# Patient Record
Sex: Female | Born: 1945 | Race: White | Hispanic: No | Smoking: Never smoker
Health system: Southern US, Community
[De-identification: ages and names within clinical notes are randomized; demographics above are authoritative.]

## PROBLEM LIST (undated history)

## (undated) DIAGNOSIS — M1990 Unspecified osteoarthritis, unspecified site: Secondary | ICD-10-CM

## (undated) DIAGNOSIS — Z8659 Personal history of other mental and behavioral disorders: Secondary | ICD-10-CM

## (undated) DIAGNOSIS — I2510 Atherosclerotic heart disease of native coronary artery without angina pectoris: Secondary | ICD-10-CM

## (undated) DIAGNOSIS — L900 Lichen sclerosus et atrophicus: Secondary | ICD-10-CM

## (undated) DIAGNOSIS — K219 Gastro-esophageal reflux disease without esophagitis: Secondary | ICD-10-CM

## (undated) DIAGNOSIS — F419 Anxiety disorder, unspecified: Secondary | ICD-10-CM

## (undated) DIAGNOSIS — E039 Hypothyroidism, unspecified: Secondary | ICD-10-CM

## (undated) DIAGNOSIS — L409 Psoriasis, unspecified: Secondary | ICD-10-CM

## (undated) DIAGNOSIS — I10 Essential (primary) hypertension: Secondary | ICD-10-CM

## (undated) DIAGNOSIS — F40240 Claustrophobia: Secondary | ICD-10-CM

## (undated) DIAGNOSIS — I639 Cerebral infarction, unspecified: Secondary | ICD-10-CM

## (undated) DIAGNOSIS — T7840XA Allergy, unspecified, initial encounter: Secondary | ICD-10-CM

## (undated) DIAGNOSIS — E785 Hyperlipidemia, unspecified: Secondary | ICD-10-CM

## (undated) DIAGNOSIS — N200 Calculus of kidney: Secondary | ICD-10-CM

## (undated) DIAGNOSIS — N390 Urinary tract infection, site not specified: Secondary | ICD-10-CM

## (undated) DIAGNOSIS — Z8619 Personal history of other infectious and parasitic diseases: Secondary | ICD-10-CM

## (undated) HISTORY — PX: CARPAL TUNNEL RELEASE: SHX101

## (undated) HISTORY — DX: Hypothyroidism, unspecified: E03.9

## (undated) HISTORY — DX: Hyperlipidemia, unspecified: E78.5

## (undated) HISTORY — PX: APPENDECTOMY: SHX54

## (undated) HISTORY — DX: Lichen sclerosus et atrophicus: L90.0

## (undated) HISTORY — PX: TUBAL LIGATION: SHX77

## (undated) HISTORY — PX: JOINT REPLACEMENT: SHX530

## (undated) HISTORY — DX: Atherosclerotic heart disease of native coronary artery without angina pectoris: I25.10

## (undated) HISTORY — PX: CARDIAC CATHETERIZATION: SHX172

## (undated) HISTORY — PX: OTHER SURGICAL HISTORY: SHX169

## (undated) HISTORY — DX: Personal history of other infectious and parasitic diseases: Z86.19

## (undated) HISTORY — DX: Cerebral infarction, unspecified: I63.9

## (undated) HISTORY — DX: Allergy, unspecified, initial encounter: T78.40XA

---

## 2002-12-27 HISTORY — PX: EYE SURGERY: SHX253

## 2005-11-15 DIAGNOSIS — E039 Hypothyroidism, unspecified: Secondary | ICD-10-CM | POA: Insufficient documentation

## 2006-01-25 DIAGNOSIS — E1169 Type 2 diabetes mellitus with other specified complication: Secondary | ICD-10-CM | POA: Insufficient documentation

## 2006-07-01 ENCOUNTER — Ambulatory Visit: Payer: Self-pay | Admitting: Family Medicine

## 2009-07-04 ENCOUNTER — Ambulatory Visit: Payer: Self-pay | Admitting: Family Medicine

## 2010-06-10 ENCOUNTER — Observation Stay (HOSPITAL_COMMUNITY): Admission: EM | Admit: 2010-06-10 | Discharge: 2010-06-11 | Payer: Self-pay | Admitting: Emergency Medicine

## 2010-06-10 ENCOUNTER — Emergency Department (HOSPITAL_COMMUNITY): Admission: EM | Admit: 2010-06-10 | Discharge: 2010-06-10 | Payer: Self-pay | Admitting: Family Medicine

## 2010-06-11 ENCOUNTER — Encounter (INDEPENDENT_AMBULATORY_CARE_PROVIDER_SITE_OTHER): Payer: Self-pay | Admitting: Surgery

## 2010-10-24 ENCOUNTER — Ambulatory Visit: Payer: Self-pay | Admitting: Cardiology

## 2010-10-24 ENCOUNTER — Inpatient Hospital Stay (HOSPITAL_COMMUNITY)
Admission: EM | Admit: 2010-10-24 | Discharge: 2010-10-27 | Payer: Self-pay | Source: Home / Self Care | Admitting: Emergency Medicine

## 2010-10-25 ENCOUNTER — Encounter (INDEPENDENT_AMBULATORY_CARE_PROVIDER_SITE_OTHER): Payer: Self-pay | Admitting: Internal Medicine

## 2010-11-06 ENCOUNTER — Ambulatory Visit: Payer: Self-pay | Admitting: Internal Medicine

## 2010-11-06 DIAGNOSIS — I152 Hypertension secondary to endocrine disorders: Secondary | ICD-10-CM | POA: Insufficient documentation

## 2010-11-06 DIAGNOSIS — E785 Hyperlipidemia, unspecified: Secondary | ICD-10-CM

## 2010-11-06 DIAGNOSIS — I2510 Atherosclerotic heart disease of native coronary artery without angina pectoris: Secondary | ICD-10-CM

## 2010-11-06 DIAGNOSIS — E1159 Type 2 diabetes mellitus with other circulatory complications: Secondary | ICD-10-CM | POA: Insufficient documentation

## 2010-11-06 DIAGNOSIS — I10 Essential (primary) hypertension: Secondary | ICD-10-CM | POA: Insufficient documentation

## 2011-01-26 NOTE — Assessment & Plan Note (Signed)
Summary: F/U [facility]   Visit Type:  Initial Consult Primary Provider:  Dr Lorie Phenix  CC:  f/u cath.  History of Present Illness: Renee [name] is a very pleasant 65 year old woman with a history of hypertension, hyperlipidemia, and a long history of diabetes.  She has admitted to [facility] earlier this month with chest pain and signifcant HTN  Cardiac cath showed mild to moderate non-obstructive CAD. With  EF of 55% to 60%.   Lipids on admit:  TC 134 TG 81 HDL 41 LDL 77 (on Lopid, Fish oil and Niacin 500qd)  Exercising at gym on treadmill and bike. Since discharge doing well no CP. No problems with groin site. Following BPs closely. Systolics mainly 120s occasionally up to 140 on lisinopril 10. On discharge lopid and niacin stopped, now on lipitor 40 and fish oil two times a day.    Preventive Screening-Counseling & Management  Alcohol-Tobacco     Smoking Status: never  Caffeine-Diet-Exercise     Does Patient Exercise: yes      Drug Use:  no.    Current Medications (verified): 1)  Lipitor 80 Mg Tabs (Atorvastatin Calcium) .... 1/2 Tablet Once Daily For One Week 2)  Xanax 0.5 Mg Tabs (Alprazolam) .... One Tablet Two Times A Day As Needed 3)  Metoprolol Tartrate 25 Mg Tabs (Metoprolol Tartrate) .... One Tablet Two Times A Day Hold For Heart Rate Less Thank 70 4)  Nitrostat 0.4 Mg Subl (Nitroglycerin) .... As Needed For Chest Pain 5)  Aspirin 81 Mg Tbec (Aspirin) .... One Tablet Once Daily 6)  Cerave  Crea (Emollient) .... One Application Topically Prn 7)  Fish Oil 1000 Mg Caps (Omega-3 Fatty Acids) .... One Tablet Two Times A Day 8)  Fluocinonide 0.05 % Crea (Fluocinonide) .... One Application Topically Daily At Bedtime As Needed 9)  Levothroid 75 Mcg Tabs (Levothyroxine Sodium) .... One Tablet Once Daily 10)  Lisinopril 10 Mg Tabs (Lisinopril) .... One Tablet Once Daily 11)  Centrum Silver  Tabs (Multiple Vitamins-Minerals) .... One Tablet Once Daily 12)   Triamcinolone Acetonide 0.1 % Crea (Triamcinolone Acetonide) 13)  Pepcid 20 Mg Tabs (Famotidine) .... At Bedtime  Allergies (verified): 1)  ! Codeine 2)  ! Zocor 3)  ! Metformin Hcl  Past History:  Past Medical History: Diabetes Hyperlipidemia Hypothyroidism Non obstructive CAD   --cath 11/11  Past Surgical History: Appendectomy Tubes tied and untied and tied again L Carpal tunnel L trigger finger  Family History: Reviewed history and no changes required. Family History of Cancer:  Family History of Coronary Artery Disease:  Family History of CVA or Stroke:  Family History of Diabetes:  Family History of Hyperlipidemia:  Family History of Hypertension:  Family History of Thyroid Disease:   Social History: Reviewed history and no changes required. Retired  Divorced  Tobacco Use - No.  Alcohol Use - yes -- very little around holidays Regular Exercise - yes Drug Use - no Smoking Status:  never Does Patient Exercise:  yes Drug Use:  no  Review of Systems       As per HPI and past medical history; otherwise all systems negative.   Vital Signs:  Patient profile:   65 year old female Height:      64 inches Weight:      162 pounds BMI:     27.91 Pulse rate:   70 / minute BP sitting:   142 / 74  (left arm) Cuff size:   large  Vitals Entered By: Hardin Negus, RMA (November 06, 2010 10:52 AM)  Physical Exam  General:  Gen: well appearing. no resp difficulty HEENT: normal Neck: supple. no JVD. Carotids 2+ bilat; no bruits. No lymphadenopathy or thryomegaly appreciated. Cor: PMI nondisplaced. Regular rate & rhythm. No rubs, gallops, murmur. Lungs: clear Abdomen: soft, nontender, nondistended. No hepatosplenomegaly. No bruits or masses. Good bowel sounds. Extremities: no cyanosis, clubbing, rash, edema. tinky knot at groin site. no bruit. Neuro: alert & orientedx3, cranial nerves grossly intact. moves all 4 extremities w/o difficulty. affect  pleasant    Impression & Recommendations:  Problem # 1:  CAD, NATIVE VESSEL (ICD-414.01) Non-obstructive. Continue risk factor management.   Problem # 2:  HYPERTENSION (ICD-401.9) BP looks good at home (mildly elevated here.) If systolics tredning over 140 can increase lisionpril to 20.   Problem # 3:  HYPERLIPIDEMIA-MIXED (ICD-272.4) LDL just about at goal without statin.  Continue lipitor 40 and fish Can increase lipitor to 80 once daily if needed to keep LDL under 70.   Other Orders: EKG w/ Interpretation (93000)  Patient Instructions: 1)  Your physician recommends that you schedule a follow-up appointment in: 1 year with Dr Mariah Milling 2)  take extra lisinopril when BP is up

## 2011-03-09 LAB — CBC
MCHC: 32.5 g/dL (ref 30.0–36.0)
RDW: 13.1 % (ref 11.5–15.5)
WBC: 5.9 10*3/uL (ref 4.0–10.5)

## 2011-03-09 LAB — BASIC METABOLIC PANEL
BUN: 15 mg/dL (ref 6–23)
Calcium: 9.3 mg/dL (ref 8.4–10.5)
GFR calc non Af Amer: >60 mL/min (ref >60)
Glucose, Bld: 140 mg/dL — ABNORMAL HIGH (ref 70–99)
Sodium: 142 mEq/L (ref 135–145)

## 2011-03-09 LAB — GLUCOSE, CAPILLARY
Glucose-Capillary: 105 mg/dL — ABNORMAL HIGH (ref 70–99)
Glucose-Capillary: 142 mg/dL — ABNORMAL HIGH (ref 70–99)

## 2011-03-10 LAB — GLUCOSE, CAPILLARY
Glucose-Capillary: 103 mg/dL — ABNORMAL HIGH (ref 70–99)
Glucose-Capillary: 106 mg/dL — ABNORMAL HIGH (ref 70–99)
Glucose-Capillary: 109 mg/dL — ABNORMAL HIGH (ref 70–99)
Glucose-Capillary: 109 mg/dL — ABNORMAL HIGH (ref 70–99)
Glucose-Capillary: 144 mg/dL — ABNORMAL HIGH (ref 70–99)

## 2011-03-10 LAB — URINALYSIS, ROUTINE W REFLEX MICROSCOPIC
Bilirubin Urine: NEGATIVE
Ketones, ur: NEGATIVE mg/dL
Nitrite: NEGATIVE
Specific Gravity, Urine: 1.005 (ref 1.005–1.030)
Urobilinogen, UA: 0.2 mg/dL (ref 0.0–1.0)

## 2011-03-10 LAB — LIPID PANEL
Cholesterol: 134 mg/dL (ref 0–200)
HDL: 41 mg/dL (ref >39)
Total CHOL/HDL Ratio: 3.3 RATIO
VLDL: 16 mg/dL (ref 0–40)

## 2011-03-10 LAB — CBC
HCT: 39.5 % (ref 36.0–46.0)
Hemoglobin: 12.8 g/dL (ref 12.0–15.0)
MCH: 30.4 pg (ref 26.0–34.0)
MCHC: 32.7 g/dL (ref 30.0–36.0)
MCV: 92.9 fL (ref 78.0–100.0)
MCV: 93.4 fL (ref 78.0–100.0)
Platelets: 343 10*3/uL (ref 150–400)
RBC: 4.23 MIL/uL (ref 3.87–5.11)
RDW: 13.2 % (ref 11.5–15.5)
WBC: 5.4 10*3/uL (ref 4.0–10.5)

## 2011-03-10 LAB — DIFFERENTIAL
Eosinophils Absolute: 0.2 10*3/uL (ref 0.0–0.7)
Eosinophils Relative: 3 % (ref 0–5)
Lymphs Abs: 1.9 10*3/uL (ref 0.7–4.0)
Lymphs Abs: 2.5 10*3/uL (ref 0.7–4.0)
Monocytes Absolute: 0.7 10*3/uL (ref 0.1–1.0)
Monocytes Absolute: 1 10*3/uL (ref 0.1–1.0)
Monocytes Relative: 12 % (ref 3–12)
Monocytes Relative: 14 % — ABNORMAL HIGH (ref 3–12)
Neutro Abs: 2.6 10*3/uL (ref 1.7–7.7)
Neutrophils Relative %: 49 % (ref 43–77)
Neutrophils Relative %: 50 % (ref 43–77)

## 2011-03-10 LAB — POCT CARDIAC MARKERS
CKMB, poc: <1 ng/mL — ABNORMAL LOW (ref 1.0–8.0)
Troponin i, poc: <0.05 ng/mL (ref 0.00–0.09)
Troponin i, poc: <0.05 ng/mL (ref 0.00–0.09)

## 2011-03-10 LAB — COMPREHENSIVE METABOLIC PANEL
Alkaline Phosphatase: 60 U/L (ref 39–117)
BUN: 17 mg/dL (ref 6–23)
Chloride: 107 mEq/L (ref 96–112)
Glucose, Bld: 147 mg/dL — ABNORMAL HIGH (ref 70–99)
Potassium: 3.7 mEq/L (ref 3.5–5.1)
Total Bilirubin: 0.4 mg/dL (ref 0.3–1.2)

## 2011-03-10 LAB — TROPONIN I: Troponin I: <0.01 ng/mL (ref 0.00–0.06)

## 2011-03-10 LAB — PROTIME-INR
INR: 1 (ref 0.00–1.49)
Prothrombin Time: 13.4 seconds (ref 11.6–15.2)

## 2011-03-10 LAB — URINE MICROSCOPIC-ADD ON

## 2011-03-10 LAB — HEMOGLOBIN A1C: Mean Plasma Glucose: 131 mg/dL — ABNORMAL HIGH (ref <117)

## 2011-03-10 LAB — CARDIAC PANEL(CRET KIN+CKTOT+MB+TROPI)
Relative Index: INVALID (ref 0.0–2.5)
Total CK: 64 U/L (ref 7–177)

## 2011-03-10 LAB — BASIC METABOLIC PANEL
BUN: 18 mg/dL (ref 6–23)
BUN: 21 mg/dL (ref 6–23)
CO2: 24 mEq/L (ref 19–32)
CO2: 25 mEq/L (ref 19–32)
Chloride: 107 mEq/L (ref 96–112)
Chloride: 108 mEq/L (ref 96–112)
Creatinine, Ser: 0.68 mg/dL (ref 0.4–1.2)
Glucose, Bld: 111 mg/dL — ABNORMAL HIGH (ref 70–99)
Glucose, Bld: 127 mg/dL — ABNORMAL HIGH (ref 70–99)
Potassium: 3.8 mEq/L (ref 3.5–5.1)

## 2011-03-10 LAB — MAGNESIUM: Magnesium: 1.9 mg/dL (ref 1.5–2.5)

## 2011-03-14 LAB — COMPREHENSIVE METABOLIC PANEL
BUN: 12 mg/dL (ref 6–23)
CO2: 27 mEq/L (ref 19–32)
Chloride: 104 mEq/L (ref 96–112)
Creatinine, Ser: 0.59 mg/dL (ref 0.4–1.2)
GFR calc non Af Amer: >60 mL/min (ref >60)
Total Bilirubin: 0.4 mg/dL (ref 0.3–1.2)

## 2011-03-14 LAB — URINALYSIS, MICROSCOPIC ONLY
Hgb urine dipstick: NEGATIVE
Nitrite: NEGATIVE
Specific Gravity, Urine: 1.007 (ref 1.005–1.030)
Urobilinogen, UA: 0.2 mg/dL (ref 0.0–1.0)

## 2011-03-14 LAB — CBC
HCT: 37.8 % (ref 36.0–46.0)
Hemoglobin: 13 g/dL (ref 12.0–15.0)
MCV: 92.1 fL (ref 78.0–100.0)
RBC: 4.1 MIL/uL (ref 3.87–5.11)
WBC: 8.3 10*3/uL (ref 4.0–10.5)

## 2011-03-14 LAB — DIFFERENTIAL
Basophils Absolute: 0 10*3/uL (ref 0.0–0.1)
Eosinophils Relative: 2 % (ref 0–5)
Lymphocytes Relative: 24 % (ref 12–46)
Neutro Abs: 5.2 10*3/uL (ref 1.7–7.7)

## 2011-03-14 LAB — GLUCOSE, CAPILLARY
Glucose-Capillary: 145 mg/dL — ABNORMAL HIGH (ref 70–99)
Glucose-Capillary: 86 mg/dL (ref 70–99)

## 2011-03-14 LAB — POCT URINALYSIS DIP (DEVICE)
Bilirubin Urine: NEGATIVE
Glucose, UA: NEGATIVE mg/dL
Nitrite: NEGATIVE

## 2011-03-14 LAB — LIPASE, BLOOD: Lipase: 27 U/L (ref 11–59)

## 2011-03-14 LAB — URINE CULTURE

## 2011-03-19 ENCOUNTER — Ambulatory Visit: Payer: 59 | Admitting: Unknown Physician Specialty

## 2011-05-17 ENCOUNTER — Telehealth: Payer: Self-pay | Admitting: Internal Medicine

## 2011-05-17 NOTE — Telephone Encounter (Signed)
Ok to cut lipitor in 1/2

## 2011-05-17 NOTE — Telephone Encounter (Signed)
Pt called stating she had lipid panel 1 week prior at Dr. Santiago Bur office, showing very low results. Pt is taking Lipitor 40mg  daily. Pt saw Dr. Gala Romney 10/2010 post cath, and pt was to follow up in 1 year with Dr. Mariah Milling. Lipids on admit 10/2010: TC 134 TG 81 HDL 41 LDL 77 (on Lopid, Fish oil and Niacin 500qd). Since pt close to goal at discharge pt was decr from Lipitor 80 to 40 daily. Pt states that labs at Dr. Elease Hashimoto last week were: TC 97 TG 83 HDL 42 LDL 38. I have requested labs from Dr. Elease Hashimoto. I advised pt for now to cut her dosage in half to 20mg , and in the meantime I will discuss with Dr. Mariah Milling and will call her back with recommendation. Please advise.

## 2011-05-17 NOTE — Telephone Encounter (Signed)
Pt would like to discuss lowering Cholesterol medication.

## 2011-05-18 NOTE — Telephone Encounter (Signed)
Pt notified. Dr. Elease Hashimoto will repeat labs in July 2012.

## 2011-07-13 ENCOUNTER — Encounter: Payer: Self-pay | Admitting: Internal Medicine

## 2011-11-11 ENCOUNTER — Encounter: Payer: Self-pay | Admitting: Cardiovascular Disease

## 2011-11-17 ENCOUNTER — Ambulatory Visit (INDEPENDENT_AMBULATORY_CARE_PROVIDER_SITE_OTHER): Payer: Medicare Other | Admitting: Cardiovascular Disease

## 2011-11-17 ENCOUNTER — Ambulatory Visit: Payer: Self-pay | Admitting: Cardiovascular Disease

## 2011-11-17 ENCOUNTER — Encounter: Payer: Self-pay | Admitting: Cardiovascular Disease

## 2011-11-17 VITALS — BP 146/84 | HR 77 | Ht 64.0 in | Wt 146.2 lb

## 2011-11-17 DIAGNOSIS — I2510 Atherosclerotic heart disease of native coronary artery without angina pectoris: Secondary | ICD-10-CM

## 2011-11-17 DIAGNOSIS — E785 Hyperlipidemia, unspecified: Secondary | ICD-10-CM

## 2011-11-17 DIAGNOSIS — I10 Essential (primary) hypertension: Secondary | ICD-10-CM

## 2011-11-17 NOTE — Assessment & Plan Note (Signed)
Currently with no symptoms of angina. No further workup at this time. Continue current medication regimen. 

## 2011-11-17 NOTE — Progress Notes (Signed)
Patient ID: Renee [name], female    DOB: 10-Feb-1946, 65 y.o.   MRN: [id number]  HPI Comments: Renee [name] is a very pleasant 65 year old woman with a history of hypertension, hyperlipidemia, and a long history of diabetes.  She has admitted to [facility] chest pain and signifcant HTN. 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 With  EF of 55% to 60%.    Since that time, she has lost 16 pounds and her hemoglobin A1c is in the low 6 range. She has been working out at [employer] with good exercise tolerance. She denies any significant chest pain or shortness of breath. She is tolerating Lipitor 20 mg daily and otherwise feels well.   EKG shows normal sinus rhythm with rate 77 beats per minute with no significant ST or T wave changes      Outpatient Encounter Prescriptions as of 11/17/2011  Medication Sig Dispense Refill  . aspirin 81 MG EC tablet Take 81 mg by mouth daily.        Marland Kitchen atorvastatin (LIPITOR) 20 MG tablet Take 20 mg by mouth daily.        . Emollient (CERAVE) CREA Apply topically as needed.        . fish oil-omega-3 fatty acids 1000 MG capsule Take 2 g by mouth 2 (two) times daily.        . fluocinonide (LIDEX) 0.05 % cream Apply topically at bedtime as needed.        Marland Kitchen levothyroxine (SYNTHROID, LEVOTHROID) 75 MCG tablet Take 75 mcg by mouth daily.        Marland Kitchen lisinopril (PRINIVIL,ZESTRIL) 20 MG tablet Take 20 mg by mouth daily.        . Multiple Vitamins-Minerals (CENTRUM SILVER PO) Take 1 tablet by mouth daily.        . nitroGLYCERIN (NITROSTAT) 0.4 MG SL tablet Place 0.4 mg under the tongue every 5 (five) minutes as needed.        . triamcinolone (KENALOG) 0.1 % cream Apply topically as directed.           Review of Systems  Constitutional: Negative.   HENT: Negative.   Eyes: Negative.   Respiratory: Negative.   Cardiovascular: Negative.   Gastrointestinal: Negative.   Musculoskeletal: Negative.   Skin: Negative.   Neurological: Negative.   Hematological:  Negative.   Psychiatric/Behavioral: Negative.   All other systems reviewed and are negative.    BP 146/84  Pulse 77  Ht 5\' 4"  (1.626 m)  Wt 146 lb 4 oz (66.339 kg)  BMI 25.10 kg/m2  Physical Exam  Nursing note and vitals reviewed. Constitutional: She is oriented to person, place, and time. She appears well-developed and well-nourished.  HENT:  Head: Normocephalic.  Nose: Nose normal.  Mouth/Throat: Oropharynx is clear and moist.  Eyes: Conjunctivae are normal. Pupils are equal, round, and reactive to light.  Neck: Normal range of motion. Neck supple. No JVD present.  Cardiovascular: Normal rate, regular rhythm, S1 normal, S2 normal, normal heart sounds and intact distal pulses.  Exam reveals no gallop and no friction rub.   No murmur heard. Pulmonary/Chest: Effort normal and breath sounds normal. No respiratory distress. She has no wheezes. She has no rales. She exhibits no tenderness.  Abdominal: Soft. Bowel sounds are normal. She exhibits no distension. There is no tenderness.  Musculoskeletal: Normal range of motion. She exhibits no edema and no tenderness.  Lymphadenopathy:    She has no cervical adenopathy.  Neurological: She is alert and  oriented to person, place, and time. Coordination normal.  Skin: Skin is warm and dry. No rash noted. No erythema.  Psychiatric: She has a normal mood and affect. Her behavior is normal. Judgment and thought content normal.         Assessment and Plan

## 2011-11-17 NOTE — Assessment & Plan Note (Signed)
Cholesterol is at goal on the current lipid regimen. No changes to the medications were made.  

## 2011-11-17 NOTE — Patient Instructions (Signed)
You are doing well. No medication changes were made.  Please call us if you have new issues that need to be addressed before your next appt.  The office will contact you for a follow up Appt. In 12 months  

## 2011-11-17 NOTE — Assessment & Plan Note (Signed)
Blood pressure is well controlled on today's visit. No changes made to the medications. 

## 2011-12-30 DIAGNOSIS — L293 Anogenital pruritus, unspecified: Secondary | ICD-10-CM | POA: Diagnosis not present

## 2011-12-31 DIAGNOSIS — L280 Lichen simplex chronicus: Secondary | ICD-10-CM | POA: Diagnosis not present

## 2011-12-31 DIAGNOSIS — L293 Anogenital pruritus, unspecified: Secondary | ICD-10-CM | POA: Diagnosis not present

## 2012-01-11 DIAGNOSIS — H47019 Ischemic optic neuropathy, unspecified eye: Secondary | ICD-10-CM | POA: Diagnosis not present

## 2012-01-12 DIAGNOSIS — L851 Acquired keratosis [keratoderma] palmaris et plantaris: Secondary | ICD-10-CM | POA: Diagnosis not present

## 2012-03-07 DIAGNOSIS — L851 Acquired keratosis [keratoderma] palmaris et plantaris: Secondary | ICD-10-CM | POA: Diagnosis not present

## 2012-03-15 DIAGNOSIS — I10 Essential (primary) hypertension: Secondary | ICD-10-CM | POA: Diagnosis not present

## 2012-03-15 DIAGNOSIS — E119 Type 2 diabetes mellitus without complications: Secondary | ICD-10-CM | POA: Diagnosis not present

## 2012-03-15 DIAGNOSIS — E039 Hypothyroidism, unspecified: Secondary | ICD-10-CM | POA: Diagnosis not present

## 2012-03-15 DIAGNOSIS — E7800 Pure hypercholesterolemia, unspecified: Secondary | ICD-10-CM | POA: Diagnosis not present

## 2012-04-05 DIAGNOSIS — T148 Other injury of unspecified body region: Secondary | ICD-10-CM | POA: Diagnosis not present

## 2012-04-05 DIAGNOSIS — R7309 Other abnormal glucose: Secondary | ICD-10-CM | POA: Diagnosis not present

## 2012-04-05 DIAGNOSIS — T7840XA Allergy, unspecified, initial encounter: Secondary | ICD-10-CM | POA: Diagnosis not present

## 2012-09-27 DIAGNOSIS — Z23 Encounter for immunization: Secondary | ICD-10-CM | POA: Diagnosis not present

## 2012-09-27 DIAGNOSIS — L259 Unspecified contact dermatitis, unspecified cause: Secondary | ICD-10-CM | POA: Diagnosis not present

## 2012-09-27 DIAGNOSIS — E119 Type 2 diabetes mellitus without complications: Secondary | ICD-10-CM | POA: Diagnosis not present

## 2012-09-27 DIAGNOSIS — E7800 Pure hypercholesterolemia, unspecified: Secondary | ICD-10-CM | POA: Diagnosis not present

## 2012-10-25 DIAGNOSIS — L570 Actinic keratosis: Secondary | ICD-10-CM | POA: Diagnosis not present

## 2012-11-17 ENCOUNTER — Encounter: Payer: Self-pay | Admitting: Cardiovascular Disease

## 2012-11-17 ENCOUNTER — Ambulatory Visit (INDEPENDENT_AMBULATORY_CARE_PROVIDER_SITE_OTHER): Payer: Medicare Other | Admitting: Cardiovascular Disease

## 2012-11-17 VITALS — BP 158/78 | HR 76 | Ht 64.5 in | Wt 158.0 lb

## 2012-11-17 DIAGNOSIS — I2510 Atherosclerotic heart disease of native coronary artery without angina pectoris: Secondary | ICD-10-CM | POA: Diagnosis not present

## 2012-11-17 DIAGNOSIS — I10 Essential (primary) hypertension: Secondary | ICD-10-CM

## 2012-11-17 DIAGNOSIS — E785 Hyperlipidemia, unspecified: Secondary | ICD-10-CM

## 2012-11-17 NOTE — Assessment & Plan Note (Signed)
Blood pressure is elevated even on recheck. Systolic pressure 160 on today's visit when checked by myself on the left. We have strongly recommended she monitor her blood pressure at home. She reports systolic pressures in the 130 range. I suggested she call either our office or Dr. Elease Hashimoto for systolic pressure more than 140 on a regular basis.

## 2012-11-17 NOTE — Progress Notes (Signed)
Patient ID: Renee [name], female    DOB: 1946-04-27, 66 y.o.   MRN: [id number]  HPI Comments: Renee [name] is a very pleasant 66 year old woman with a history of hypertension, hyperlipidemia, and a long history of diabetes.  She has admitted to [facility] with chest pain and signifcant HTN. 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 With  EF of 55% to 60%.    She reports that since her last clinic visit, she has gained 12 pounds. She is eating more. Her blood pressure is up, hemoglobin A1c is now 6.8. She continues on Lipitor. She is active but does not participate in regular exercise program. She does report some mild swelling in her feet and ankles. She denies any significant chest pain or shortness of breath.   EKG shows normal sinus rhythm with rate 75 beats per minute with no significant ST or T wave changes      Outpatient Encounter Prescriptions as of 11/17/2012  Medication Sig Dispense Refill  . aspirin 81 MG EC tablet Take 81 mg by mouth daily.        Marland Kitchen atorvastatin (LIPITOR) 20 MG tablet Take 20 mg by mouth daily.        . Emollient (CERAVE) CREA Apply topically as needed.        . fish oil-omega-3 fatty acids 1000 MG capsule Take 2 g by mouth 2 (two) times daily.        . fluocinonide (LIDEX) 0.05 % cream Apply topically at bedtime as needed.        Marland Kitchen levothyroxine (SYNTHROID, LEVOTHROID) 75 MCG tablet Take 75 mcg by mouth daily.        Marland Kitchen lisinopril (PRINIVIL,ZESTRIL) 20 MG tablet Take 20 mg by mouth daily.        . Multiple Vitamins-Minerals (CENTRUM SILVER PO) Take 1 tablet by mouth daily.        . nitroGLYCERIN (NITROSTAT) 0.4 MG SL tablet Place 0.4 mg under the tongue every 5 (five) minutes as needed.        . triamcinolone (KENALOG) 0.1 % cream Apply topically as directed.           Review of Systems  Constitutional: Positive for unexpected weight change.  HENT: Negative.   Eyes: Negative.   Respiratory: Negative.   Cardiovascular: Negative.   Gastrointestinal:  Negative.   Musculoskeletal: Negative.   Skin: Negative.   Neurological: Negative.   Hematological: Negative.   Psychiatric/Behavioral: Negative.   All other systems reviewed and are negative.    BP 158/78  Pulse 76  Ht 5' 4.5" (1.638 m)  Wt 158 lb (71.668 kg)  BMI 26.70 kg/m2  Physical Exam  Nursing note and vitals reviewed. Constitutional: She is oriented to person, place, and time. She appears well-developed and well-nourished.  HENT:  Head: Normocephalic.  Nose: Nose normal.  Mouth/Throat: Oropharynx is clear and moist.  Eyes: Conjunctivae normal are normal. Pupils are equal, round, and reactive to light.  Neck: Normal range of motion. Neck supple. No JVD present.  Cardiovascular: Normal rate, regular rhythm, S1 normal, S2 normal, normal heart sounds and intact distal pulses.  Exam reveals no gallop and no friction rub.   No murmur heard. Pulmonary/Chest: Effort normal and breath sounds normal. No respiratory distress. She has no wheezes. She has no rales. She exhibits no tenderness.  Abdominal: Soft. Bowel sounds are normal. She exhibits no distension. There is no tenderness.  Musculoskeletal: Normal range of motion. She exhibits no edema and no tenderness.  Lymphadenopathy:    She has no cervical adenopathy.  Neurological: She is alert and oriented to person, place, and time. Coordination normal.  Skin: Skin is warm and dry. No rash noted. No erythema.  Psychiatric: She has a normal mood and affect. Her behavior is normal. Judgment and thought content normal.         Assessment and Plan

## 2012-11-17 NOTE — Patient Instructions (Addendum)
You are doing well. No medication changes were made.  Monitor your blood pressure at home. If your top number continues to be greater than 140, call our office  Please call us if you have new issues that need to be addressed before your next appt.  Your physician wants you to follow-up in: 12 months.  You will receive a reminder letter in the mail two months in advance. If you don't receive a letter, please call our office to schedule the follow-up appointment.

## 2012-11-17 NOTE — Assessment & Plan Note (Signed)
Currently with no symptoms of angina. No further workup at this time. Continue current medication regimen. 

## 2012-11-17 NOTE — Assessment & Plan Note (Addendum)
Given previously seen coronary artery disease, underlying diabetes, we have suggested she stay on her statin. Goal LDL less than 70

## 2012-11-20 DIAGNOSIS — Z1231 Encounter for screening mammogram for malignant neoplasm of breast: Secondary | ICD-10-CM | POA: Diagnosis not present

## 2012-11-28 DIAGNOSIS — L570 Actinic keratosis: Secondary | ICD-10-CM | POA: Diagnosis not present

## 2012-11-28 DIAGNOSIS — L219 Seborrheic dermatitis, unspecified: Secondary | ICD-10-CM | POA: Diagnosis not present

## 2012-11-28 DIAGNOSIS — L738 Other specified follicular disorders: Secondary | ICD-10-CM | POA: Diagnosis not present

## 2012-11-28 DIAGNOSIS — L259 Unspecified contact dermatitis, unspecified cause: Secondary | ICD-10-CM | POA: Diagnosis not present

## 2013-02-01 DIAGNOSIS — E119 Type 2 diabetes mellitus without complications: Secondary | ICD-10-CM | POA: Diagnosis not present

## 2013-04-27 DIAGNOSIS — Z23 Encounter for immunization: Secondary | ICD-10-CM | POA: Diagnosis not present

## 2013-04-27 DIAGNOSIS — I10 Essential (primary) hypertension: Secondary | ICD-10-CM | POA: Diagnosis not present

## 2013-04-27 DIAGNOSIS — E039 Hypothyroidism, unspecified: Secondary | ICD-10-CM | POA: Diagnosis not present

## 2013-04-27 DIAGNOSIS — E119 Type 2 diabetes mellitus without complications: Secondary | ICD-10-CM | POA: Diagnosis not present

## 2013-04-27 DIAGNOSIS — E7800 Pure hypercholesterolemia, unspecified: Secondary | ICD-10-CM | POA: Diagnosis not present

## 2013-05-16 DIAGNOSIS — Z01419 Encounter for gynecological examination (general) (routine) without abnormal findings: Secondary | ICD-10-CM | POA: Diagnosis not present

## 2013-05-16 DIAGNOSIS — Z1151 Encounter for screening for human papillomavirus (HPV): Secondary | ICD-10-CM | POA: Diagnosis not present

## 2013-05-16 DIAGNOSIS — Z124 Encounter for screening for malignant neoplasm of cervix: Secondary | ICD-10-CM | POA: Diagnosis not present

## 2013-05-25 ENCOUNTER — Telehealth: Payer: Self-pay

## 2013-05-25 NOTE — Telephone Encounter (Signed)
Pt has question regarding her medication, Lipitor. Please call

## 2013-05-25 NOTE — Telephone Encounter (Signed)
Pt says she has been on atorvastatin for years, originally started by Dr. Gala Romney.   She began to experience severe muscle aches/pains, insomnia and nausea.  She held atorvastatin on her own x 11 weeks and symptoms resolved She went to PCP (Dr. Elease Hashimoto) this month for physical and informed her she stopped this Cholesterol was checked and total cholesterol was 223 (last cholesterol March 2013=125 on atorvastatin) Dr. Elease Hashimoto asked pt to try simvastatin instead.  Pt tried this x 3 weeks and symptoms returned She has held this medication x 2 days She talked to pharmacist who suggested she call us She asks if there are other alternatives to the atorvastatin I told her I would address with Dr. Mariah Milling and call her back Understanding verb

## 2013-05-29 MED ORDER — ROSUVASTATIN CALCIUM 5 MG PO TABS: 5.0000 mg | ORAL_TABLET | Freq: Every day | ORAL | Status: DC

## 2013-05-29 NOTE — Telephone Encounter (Signed)
Patient would like to try the crestor 5 mg because she is already taking atorvastatin 10 mg 1/2 tablet daily. The patient will come and pick up samples of crestor 10 mg to cut in half to equal 5 mg. She will call to let us know how the crestor 5 mg is working for her. I will send a Rx of crestor 5 mg to [facility] in [place].

## 2013-05-29 NOTE — Telephone Encounter (Signed)
Told the patient to try the Crestor 5 mg take one tablet every other day for a couple of weeks.

## 2013-05-29 NOTE — Telephone Encounter (Signed)
Options include re trying generic Lipitor but cut in half, take 10 mg If she would like an alternate medication, could try Crestor 5 mg daily (perhaps start every other day for a few weeks)

## 2013-05-29 NOTE — Telephone Encounter (Signed)
Sent a Rx for Crestor 5 mg.

## 2013-05-30 ENCOUNTER — Telehealth: Payer: Self-pay

## 2013-05-30 NOTE — Telephone Encounter (Signed)
If unable to afford Crestor, and unable to tolerate generic Lipitor Would start simvastatin 20 mg daily Send in 40 mg pills Would take 20 mg pill for one month before increasing dose

## 2013-05-30 NOTE — Telephone Encounter (Signed)
Pt is requesting a generic RX for cholesterol med She has samples of crestor we gave her but will not be able to afford these after samples run out Has already tried atorvastatin, needs an alternative Will have Dr. Mariah Milling review and advise and will call her back

## 2013-05-30 NOTE — Telephone Encounter (Signed)
Pt picked up samples of Crestor, pt states she needs a generic.

## 2013-05-31 NOTE — Telephone Encounter (Signed)
Left message for call back.

## 2013-05-31 NOTE — Telephone Encounter (Signed)
Patient called back and advised that she already tried to take Simvastatin but did not tolerate due to dizziness and SOB. She tried to get into the patient assistance program for Crestor but was advised that she did not qualify. States that she tolerated Lopid in the past. Advised will discuss with Dr. Mariah Milling and call her back. To stay on Crestor for now.

## 2013-06-05 ENCOUNTER — Telehealth: Payer: Self-pay | Admitting: Cardiovascular Disease

## 2013-06-05 NOTE — Telephone Encounter (Signed)
20 mg lovastatin or pravastatin with recheck of cholesterol in 3 months time. Slow titration up of the medication if tolerated

## 2013-06-05 NOTE — Telephone Encounter (Signed)
Patient called re: Crestor 5 mg is making her sick .Marland Kitchen..dizzy and nauseous

## 2013-06-05 NOTE — Telephone Encounter (Signed)
She has tried generic atorvastatin What about lovastatin or pravastatin? What dose?

## 2013-06-05 NOTE — Telephone Encounter (Signed)
Lopid not strong enough for her Has she tried generic lipitor, or lovastatin or pravastatin?

## 2013-06-05 NOTE — Telephone Encounter (Signed)
I advised pt to stop Crestor until we can find alternative. She cannot tolerate statins Should we try zetia?

## 2013-06-06 NOTE — Telephone Encounter (Signed)
Pt says she does not want another statin. Can we try zetia?

## 2013-06-06 NOTE — Telephone Encounter (Signed)
Certainly can use zetia 10 mg daily This we'll not give her a big drop him a possibly 10-15%  Could also use in addition colestipol or welchol 3 tabs twice a day This will bind cholesterol  and pull out through the GI tract

## 2013-06-07 ENCOUNTER — Other Ambulatory Visit: Payer: Self-pay

## 2013-06-07 MED ORDER — COLESTIPOL HCL 1 G PO TABS: 2.0000 g | ORAL_TABLET | Freq: Two times a day (BID) | ORAL | Status: DC

## 2013-06-07 MED ORDER — COLESEVELAM HCL 625 MG PO TABS: 1875.0000 mg | ORAL_TABLET | Freq: Two times a day (BID) | ORAL | Status: DC

## 2013-06-07 NOTE — Telephone Encounter (Signed)
FYI

## 2013-06-07 NOTE — Telephone Encounter (Signed)
Pt called back Says she is at pharmacy and was told welchol does not have generic form I told her i am calling in RX for colestipol instead ( per Dr. Windell Hummingbird note) Understanding verb

## 2013-06-07 NOTE — Telephone Encounter (Signed)
Pt informed She would rather not try zetia since it is not generic Is willing to try welchol Will send in RX and she will let us know how she tolerates

## 2013-07-27 ENCOUNTER — Telehealth: Payer: Self-pay | Admitting: *Deleted

## 2013-07-27 DIAGNOSIS — E7800 Pure hypercholesterolemia, unspecified: Secondary | ICD-10-CM | POA: Diagnosis not present

## 2013-07-27 DIAGNOSIS — E039 Hypothyroidism, unspecified: Secondary | ICD-10-CM | POA: Diagnosis not present

## 2013-07-27 DIAGNOSIS — E119 Type 2 diabetes mellitus without complications: Secondary | ICD-10-CM | POA: Diagnosis not present

## 2013-07-27 DIAGNOSIS — Z23 Encounter for immunization: Secondary | ICD-10-CM | POA: Diagnosis not present

## 2013-07-27 NOTE — Telephone Encounter (Signed)
Called spoke with pt pt states she took Colestipos 1mg  x 2 weeks developed rash all over body as well as swelling, pt stopped taking Colestipos.  Saw primary MD Dr Elease Hashimoto she restarted pt on Gemfibrozil 600mg  BID because this is the only cholesterol medication pt states she has been able to tolerate in the past.  Will forward information to Dr Mariah Milling.

## 2013-07-27 NOTE — Telephone Encounter (Signed)
Per Dr. Elease Hashimoto she is to stop taking Colestipos 1 mg due to a bad reaction.

## 2013-08-14 DIAGNOSIS — L408 Other psoriasis: Secondary | ICD-10-CM | POA: Diagnosis not present

## 2013-09-07 DIAGNOSIS — L259 Unspecified contact dermatitis, unspecified cause: Secondary | ICD-10-CM | POA: Diagnosis not present

## 2013-09-07 DIAGNOSIS — D485 Neoplasm of uncertain behavior of skin: Secondary | ICD-10-CM | POA: Diagnosis not present

## 2013-09-10 ENCOUNTER — Telehealth: Payer: Self-pay

## 2013-09-10 MED ORDER — NITROGLYCERIN 0.4 MG SL SUBL: 0.4000 mg | SUBLINGUAL_TABLET | SUBLINGUAL | Status: DC | PRN

## 2013-09-10 NOTE — Telephone Encounter (Signed)
Pt states she has a question about her nitroglycerin, states she has never used it,not sure if they "are still good, I have never opened them",  has had some chest pain  on Thursday, left side, under her breast, some pain in back close to her shoulder, only lasted "a little bit". Please call.

## 2013-09-10 NOTE — Telephone Encounter (Signed)
Spoke w/ pt. She has had occasional chest pain (twice last year, again on Thursday). Had a mammogram last week and asked them to evaluate her, as the pain originates "under my boob". States that it feels like a muscle pain, as she has problems with her shoulder and feels that they may be related.  States that pain is mild, goes away "in a few minutes" and since it is so infrequent, she didn't think anything of it, but her daughter encouraged her to call.  Sent in a refill on her nitro and went over the instructions on how and when to take it.   Pt is scheduled to be seen in November, but told her to call us she would like a sooner appt or if the pain recurs.

## 2013-09-13 DIAGNOSIS — E039 Hypothyroidism, unspecified: Secondary | ICD-10-CM | POA: Diagnosis not present

## 2013-09-13 DIAGNOSIS — E119 Type 2 diabetes mellitus without complications: Secondary | ICD-10-CM | POA: Diagnosis not present

## 2013-09-13 DIAGNOSIS — E7800 Pure hypercholesterolemia, unspecified: Secondary | ICD-10-CM | POA: Diagnosis not present

## 2013-09-13 DIAGNOSIS — Z23 Encounter for immunization: Secondary | ICD-10-CM | POA: Diagnosis not present

## 2013-11-20 ENCOUNTER — Ambulatory Visit: Payer: Medicare Other | Admitting: Cardiovascular Disease

## 2013-11-21 ENCOUNTER — Ambulatory Visit (INDEPENDENT_AMBULATORY_CARE_PROVIDER_SITE_OTHER): Payer: Medicare Other | Admitting: Cardiovascular Disease

## 2013-11-21 ENCOUNTER — Encounter: Payer: Self-pay | Admitting: Cardiovascular Disease

## 2013-11-21 VITALS — BP 160/70 | HR 82 | Ht 64.5 in | Wt 156.5 lb

## 2013-11-21 DIAGNOSIS — R079 Chest pain, unspecified: Secondary | ICD-10-CM

## 2013-11-21 DIAGNOSIS — I10 Essential (primary) hypertension: Secondary | ICD-10-CM | POA: Diagnosis not present

## 2013-11-21 DIAGNOSIS — E785 Hyperlipidemia, unspecified: Secondary | ICD-10-CM

## 2013-11-21 DIAGNOSIS — I2510 Atherosclerotic heart disease of native coronary artery without angina pectoris: Secondary | ICD-10-CM | POA: Diagnosis not present

## 2013-11-21 DIAGNOSIS — Z1231 Encounter for screening mammogram for malignant neoplasm of breast: Secondary | ICD-10-CM | POA: Diagnosis not present

## 2013-11-21 DIAGNOSIS — N63 Unspecified lump in unspecified breast: Secondary | ICD-10-CM | POA: Diagnosis not present

## 2013-11-21 NOTE — Assessment & Plan Note (Signed)
Atypical chest pain, twice per year this past year, fleeting lasting several seconds, positional component, improved with deep breath. No further workup ordered at this time. Suggested that nitroglycerin will likely not help her symptoms

## 2013-11-21 NOTE — Progress Notes (Signed)
Patient ID: Renee [name], female    DOB: March 17, 1946, 67 y.o.   MRN: [id number]  HPI Comments: Renee [name] is a very pleasant 67 year old woman with a history of hypertension, hyperlipidemia, and a long history of diabetes.  She has admitted to [facility] with chest pain and signifcant HTN. 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 With  EF of 55% to 60%.    In followup today, she reports that in general she is doing well but rarely has pain under her left breast. She had 2 episodes this year, call our office in September 2014 reporting having a fleeting episode of chest pain under the left breast lasting one or 2 seconds. She wanted nitroglycerin. On further discussion today, pain goes away after she takes a breath in, feels sharp, typically last one or 2 seconds. Some positional component to her pain. She has no significant symptoms with exertion and at baseline is very active. She reports blood pressure elevated today but it is much improved at home outside of Dr. offices  She reports that she is controlling her diabetes with diet alone. Not taking any medications Last cholesterol we have was in 2011, total cholesterol that time 134 She reports having a significant reaction to statins, in particular possibly simvastatin which she had a rash. Pictures were still on her cell phone and she showed these to [place] today   EKG shows normal sinus rhythm with rate 82 beats per minute with no significant ST or T wave changes     Outpatient Encounter Prescriptions as of 11/21/2013  Medication Sig  . aspirin 81 MG EC tablet Take 81 mg by mouth daily.    . Emollient (CERAVE) CREA Apply topically as needed.    . fish oil-omega-3 fatty acids 1000 MG capsule Take 2 g by mouth 2 (two) times daily.    . fluocinonide (LIDEX) 0.05 % cream Apply topically at bedtime as needed.    Marland Kitchen gemfibrozil (LOPID) 600 MG tablet Take 600 mg by mouth 2 (two) times daily before a meal.  . levothyroxine (SYNTHROID,  LEVOTHROID) 75 MCG tablet Take 75 mcg by mouth daily.    Marland Kitchen lisinopril (PRINIVIL,ZESTRIL) 20 MG tablet Take 20 mg by mouth daily.    . Multiple Vitamins-Minerals (CENTRUM SILVER PO) Take 1 tablet by mouth daily.    . nitroGLYCERIN (NITROSTAT) 0.4 MG SL tablet Place 1 tablet (0.4 mg total) under the tongue every 5 (five) minutes as needed.  . triamcinolone (KENALOG) 0.1 % cream Apply topically as directed.       Review of Systems  Constitutional: Negative.   HENT: Negative.   Eyes: Negative.   Respiratory: Negative.   Cardiovascular: Positive for chest pain.  Gastrointestinal: Negative.   Endocrine: Negative.   Musculoskeletal: Negative.   Skin: Negative.   Allergic/Immunologic: Negative.   Neurological: Negative.   Hematological: Negative.   Psychiatric/Behavioral: Negative.   All other systems reviewed and are negative.    BP 160/70  Pulse 82  Ht 5' 4.5" (1.638 m)  Wt 156 lb 8 oz (70.988 kg)  BMI 26.46 kg/m2  Physical Exam  Nursing note and vitals reviewed. Constitutional: She is oriented to person, place, and time. She appears well-developed and well-nourished.  HENT:  Head: Normocephalic.  Nose: Nose normal.  Mouth/Throat: Oropharynx is clear and moist.  Eyes: Conjunctivae are normal. Pupils are equal, round, and reactive to light.  Neck: Normal range of motion. Neck supple. No JVD present.  Cardiovascular: Normal rate, regular rhythm, S1  normal, S2 normal, normal heart sounds and intact distal pulses.  Exam reveals no gallop and no friction rub.   No murmur heard. Pulmonary/Chest: Effort normal and breath sounds normal. No respiratory distress. She has no wheezes. She has no rales. She exhibits no tenderness.  Abdominal: Soft. Bowel sounds are normal. She exhibits no distension. There is no tenderness.  Musculoskeletal: Normal range of motion. She exhibits no edema and no tenderness.  Lymphadenopathy:    She has no cervical adenopathy.  Neurological: She is alert  and oriented to person, place, and time. Coordination normal.  Skin: Skin is warm and dry. No rash noted. No erythema.  Psychiatric: She has a normal mood and affect. Her behavior is normal. Judgment and thought content normal.    Assessment and Plan

## 2013-11-21 NOTE — Patient Instructions (Signed)
You are doing well. No medication changes were made.  Please call us if you have new issues that need to be addressed before your next appt.  Your physician wants you to follow-up in: 12 months.  You will receive a reminder letter in the mail two months in advance. If you don't receive a letter, please call our office to schedule the follow-up appointment. 

## 2013-11-21 NOTE — Assessment & Plan Note (Signed)
Currently with no symptoms of angina. No further workup at this time. Continue current medication regimen. Prior cardiac catheterization showing mild CAD

## 2013-11-21 NOTE — Assessment & Plan Note (Signed)
Blood pressure is well controlled on today's visit. No changes made to the medications. 

## 2013-11-21 NOTE — Assessment & Plan Note (Signed)
Prior allergy on statins.

## 2013-11-30 DIAGNOSIS — Z1331 Encounter for screening for depression: Secondary | ICD-10-CM | POA: Diagnosis not present

## 2013-11-30 DIAGNOSIS — I2510 Atherosclerotic heart disease of native coronary artery without angina pectoris: Secondary | ICD-10-CM | POA: Diagnosis not present

## 2013-11-30 DIAGNOSIS — E119 Type 2 diabetes mellitus without complications: Secondary | ICD-10-CM | POA: Diagnosis not present

## 2013-11-30 DIAGNOSIS — E039 Hypothyroidism, unspecified: Secondary | ICD-10-CM | POA: Diagnosis not present

## 2013-11-30 DIAGNOSIS — Z1330 Encounter for screening examination for mental health and behavioral disorders, unspecified: Secondary | ICD-10-CM | POA: Diagnosis not present

## 2013-11-30 DIAGNOSIS — E785 Hyperlipidemia, unspecified: Secondary | ICD-10-CM | POA: Diagnosis not present

## 2013-11-30 DIAGNOSIS — E7800 Pure hypercholesterolemia, unspecified: Secondary | ICD-10-CM | POA: Diagnosis not present

## 2013-12-27 HISTORY — PX: KNEE SURGERY: SHX244

## 2014-01-28 DIAGNOSIS — E7800 Pure hypercholesterolemia, unspecified: Secondary | ICD-10-CM | POA: Diagnosis not present

## 2014-01-28 DIAGNOSIS — IMO0002 Reserved for concepts with insufficient information to code with codable children: Secondary | ICD-10-CM | POA: Diagnosis not present

## 2014-01-28 DIAGNOSIS — M1710 Unilateral primary osteoarthritis, unspecified knee: Secondary | ICD-10-CM | POA: Diagnosis not present

## 2014-01-28 DIAGNOSIS — Z1330 Encounter for screening examination for mental health and behavioral disorders, unspecified: Secondary | ICD-10-CM | POA: Diagnosis not present

## 2014-01-28 DIAGNOSIS — Z1331 Encounter for screening for depression: Secondary | ICD-10-CM | POA: Diagnosis not present

## 2014-01-28 DIAGNOSIS — I2510 Atherosclerotic heart disease of native coronary artery without angina pectoris: Secondary | ICD-10-CM | POA: Diagnosis not present

## 2014-02-27 DIAGNOSIS — H2510 Age-related nuclear cataract, unspecified eye: Secondary | ICD-10-CM | POA: Diagnosis not present

## 2014-04-03 DIAGNOSIS — M1710 Unilateral primary osteoarthritis, unspecified knee: Secondary | ICD-10-CM | POA: Diagnosis not present

## 2014-04-03 DIAGNOSIS — M25569 Pain in unspecified knee: Secondary | ICD-10-CM | POA: Diagnosis not present

## 2014-04-05 DIAGNOSIS — Z1331 Encounter for screening for depression: Secondary | ICD-10-CM | POA: Diagnosis not present

## 2014-04-05 DIAGNOSIS — E119 Type 2 diabetes mellitus without complications: Secondary | ICD-10-CM | POA: Diagnosis not present

## 2014-04-05 DIAGNOSIS — I10 Essential (primary) hypertension: Secondary | ICD-10-CM | POA: Diagnosis not present

## 2014-04-05 DIAGNOSIS — E785 Hyperlipidemia, unspecified: Secondary | ICD-10-CM | POA: Diagnosis not present

## 2014-04-05 DIAGNOSIS — Z23 Encounter for immunization: Secondary | ICD-10-CM | POA: Diagnosis not present

## 2014-06-10 DIAGNOSIS — M1710 Unilateral primary osteoarthritis, unspecified knee: Secondary | ICD-10-CM | POA: Diagnosis not present

## 2014-07-11 DIAGNOSIS — M1710 Unilateral primary osteoarthritis, unspecified knee: Secondary | ICD-10-CM | POA: Diagnosis not present

## 2014-07-18 DIAGNOSIS — M1710 Unilateral primary osteoarthritis, unspecified knee: Secondary | ICD-10-CM | POA: Diagnosis not present

## 2014-07-22 DIAGNOSIS — N3000 Acute cystitis without hematuria: Secondary | ICD-10-CM | POA: Diagnosis not present

## 2014-07-22 DIAGNOSIS — N390 Urinary tract infection, site not specified: Secondary | ICD-10-CM | POA: Diagnosis not present

## 2014-07-22 DIAGNOSIS — Z23 Encounter for immunization: Secondary | ICD-10-CM | POA: Diagnosis not present

## 2014-07-22 DIAGNOSIS — I10 Essential (primary) hypertension: Secondary | ICD-10-CM | POA: Diagnosis not present

## 2014-07-22 DIAGNOSIS — E785 Hyperlipidemia, unspecified: Secondary | ICD-10-CM | POA: Diagnosis not present

## 2014-07-22 DIAGNOSIS — Z1331 Encounter for screening for depression: Secondary | ICD-10-CM | POA: Diagnosis not present

## 2014-07-24 DIAGNOSIS — M25569 Pain in unspecified knee: Secondary | ICD-10-CM | POA: Diagnosis not present

## 2014-07-24 DIAGNOSIS — M1710 Unilateral primary osteoarthritis, unspecified knee: Secondary | ICD-10-CM | POA: Diagnosis not present

## 2014-08-19 DIAGNOSIS — M25569 Pain in unspecified knee: Secondary | ICD-10-CM | POA: Diagnosis not present

## 2014-08-28 DIAGNOSIS — M1710 Unilateral primary osteoarthritis, unspecified knee: Secondary | ICD-10-CM | POA: Diagnosis not present

## 2014-09-04 DIAGNOSIS — Z5189 Encounter for other specified aftercare: Secondary | ICD-10-CM | POA: Diagnosis not present

## 2014-09-04 DIAGNOSIS — Z23 Encounter for immunization: Secondary | ICD-10-CM | POA: Diagnosis not present

## 2014-09-04 DIAGNOSIS — Z1331 Encounter for screening for depression: Secondary | ICD-10-CM | POA: Diagnosis not present

## 2014-09-04 DIAGNOSIS — I10 Essential (primary) hypertension: Secondary | ICD-10-CM | POA: Diagnosis not present

## 2014-09-04 DIAGNOSIS — H00019 Hordeolum externum unspecified eye, unspecified eyelid: Secondary | ICD-10-CM | POA: Diagnosis not present

## 2014-09-13 DIAGNOSIS — Z0000 Encounter for general adult medical examination without abnormal findings: Secondary | ICD-10-CM | POA: Diagnosis not present

## 2014-09-13 DIAGNOSIS — Z23 Encounter for immunization: Secondary | ICD-10-CM | POA: Diagnosis not present

## 2014-09-13 DIAGNOSIS — E785 Hyperlipidemia, unspecified: Secondary | ICD-10-CM | POA: Diagnosis not present

## 2014-09-13 DIAGNOSIS — I10 Essential (primary) hypertension: Secondary | ICD-10-CM | POA: Diagnosis not present

## 2014-09-13 DIAGNOSIS — E119 Type 2 diabetes mellitus without complications: Secondary | ICD-10-CM | POA: Diagnosis not present

## 2014-09-13 DIAGNOSIS — E039 Hypothyroidism, unspecified: Secondary | ICD-10-CM | POA: Diagnosis not present

## 2014-09-13 DIAGNOSIS — Z1331 Encounter for screening for depression: Secondary | ICD-10-CM | POA: Diagnosis not present

## 2014-09-27 DIAGNOSIS — X58XXXA Exposure to other specified factors, initial encounter: Secondary | ICD-10-CM | POA: Diagnosis not present

## 2014-09-27 DIAGNOSIS — S83241A Other tear of medial meniscus, current injury, right knee, initial encounter: Secondary | ICD-10-CM | POA: Diagnosis not present

## 2014-09-27 DIAGNOSIS — M94261 Chondromalacia, right knee: Secondary | ICD-10-CM | POA: Diagnosis not present

## 2014-09-27 DIAGNOSIS — G8918 Other acute postprocedural pain: Secondary | ICD-10-CM | POA: Diagnosis not present

## 2014-09-27 DIAGNOSIS — Y999 Unspecified external cause status: Secondary | ICD-10-CM | POA: Diagnosis not present

## 2014-09-27 DIAGNOSIS — Y929 Unspecified place or not applicable: Secondary | ICD-10-CM | POA: Diagnosis not present

## 2014-09-27 DIAGNOSIS — M1711 Unilateral primary osteoarthritis, right knee: Secondary | ICD-10-CM | POA: Diagnosis not present

## 2014-09-27 DIAGNOSIS — M2241 Chondromalacia patellae, right knee: Secondary | ICD-10-CM | POA: Diagnosis not present

## 2014-09-27 DIAGNOSIS — S83281A Other tear of lateral meniscus, current injury, right knee, initial encounter: Secondary | ICD-10-CM | POA: Diagnosis not present

## 2014-11-04 DIAGNOSIS — L401 Generalized pustular psoriasis: Secondary | ICD-10-CM | POA: Diagnosis not present

## 2014-11-04 DIAGNOSIS — L821 Other seborrheic keratosis: Secondary | ICD-10-CM | POA: Diagnosis not present

## 2014-11-20 ENCOUNTER — Telehealth: Payer: Self-pay | Admitting: Cardiovascular Disease

## 2014-11-20 NOTE — Telephone Encounter (Deleted)
New

## 2014-11-25 ENCOUNTER — Ambulatory Visit (INDEPENDENT_AMBULATORY_CARE_PROVIDER_SITE_OTHER): Payer: Medicare Other | Admitting: Cardiovascular Disease

## 2014-11-25 ENCOUNTER — Encounter: Payer: Self-pay | Admitting: Cardiovascular Disease

## 2014-11-25 VITALS — BP 128/62 | HR 86 | Ht 64.0 in | Wt 158.8 lb

## 2014-11-25 DIAGNOSIS — E785 Hyperlipidemia, unspecified: Secondary | ICD-10-CM | POA: Diagnosis not present

## 2014-11-25 DIAGNOSIS — M179 Osteoarthritis of knee, unspecified: Secondary | ICD-10-CM | POA: Insufficient documentation

## 2014-11-25 DIAGNOSIS — I2510 Atherosclerotic heart disease of native coronary artery without angina pectoris: Secondary | ICD-10-CM

## 2014-11-25 DIAGNOSIS — I10 Essential (primary) hypertension: Secondary | ICD-10-CM

## 2014-11-25 DIAGNOSIS — M170 Bilateral primary osteoarthritis of knee: Secondary | ICD-10-CM

## 2014-11-25 DIAGNOSIS — Z1231 Encounter for screening mammogram for malignant neoplasm of breast: Secondary | ICD-10-CM | POA: Diagnosis not present

## 2014-11-25 DIAGNOSIS — M1710 Unilateral primary osteoarthritis, unspecified knee: Secondary | ICD-10-CM | POA: Insufficient documentation

## 2014-11-25 NOTE — Patient Instructions (Signed)
You are doing well. No medication changes were made.  Please call us if you have new issues that need to be addressed before your next appt.  Your physician wants you to follow-up in: 12 months.  You will receive a reminder letter in the mail two months in advance. If you don't receive a letter, please call our office to schedule the follow-up appointment. 

## 2014-11-25 NOTE — Assessment & Plan Note (Signed)
Currently with no symptoms of angina. No further workup at this time. Continue current medication regimen. 

## 2014-11-25 NOTE — Assessment & Plan Note (Signed)
Severe bilateral osteoarthritis. She would be acceptable risk for bilateral total knee replacements in early 2016. No further cardiac testing needed

## 2014-11-25 NOTE — Progress Notes (Signed)
Patient ID: Renee [name], female    DOB: 1946-12-26, 68 y.o.   MRN: [id number]  HPI Comments: Renee [name] is a very pleasant 68 year old woman with a history of hypertension, hyperlipidemia, and a long history of diabetes.  Cardiac catheterization October 2011 for chest pain showed mild to moderate non-obstructive CAD. 2010  With  EF of 55% to 60%.   40-50% LAD disease, 60% ostial diagonal disease, 40% PDA She presents today for follow-up of her coronary artery disease  She denies having any significant chest pain. Her biggest complaint is bilateral knee pain. She reports diagnosis of osteoarthritis, has received numerous cortisone shots and cartilage infusion with no improvement of her symptoms. She would like to have bilateral total knee replacements in early 2016.  She's not reactive secondary to her severe knee pain. She is unable to tolerate statins, tolerating gemfibrozil She reports total cholesterol in the 170 range Cholesterol previously 130s on a statin Controlling her diabetes with diet alone. Blood pressure has been relatively well-controlled at home  No EKG done today, this was done with primary care recently  Previous significant reaction to statins, simvastatin may have caused a rash.      Outpatient Encounter Prescriptions as of 11/25/2014  Medication Sig  . aspirin 81 MG EC tablet Take 81 mg by mouth daily.    . Emollient (CERAVE) CREA Apply topically as needed.    . fish oil-omega-3 fatty acids 1000 MG capsule Take 2 g by mouth 2 (two) times daily.    . fluocinonide (LIDEX) 0.05 % cream Apply topically at bedtime as needed.    Marland Kitchen gemfibrozil (LOPID) 600 MG tablet Take 600 mg by mouth 2 (two) times daily before a meal.  . levothyroxine (SYNTHROID, LEVOTHROID) 75 MCG tablet Take 75 mcg by mouth daily.    Marland Kitchen lisinopril (PRINIVIL,ZESTRIL) 20 MG tablet Take 20 mg by mouth daily.    . Multiple Vitamins-Minerals (CENTRUM SILVER PO) Take 1 tablet by mouth daily.    .  nitroGLYCERIN (NITROSTAT) 0.4 MG SL tablet Place 1 tablet (0.4 mg total) under the tongue every 5 (five) minutes as needed.  . triamcinolone (KENALOG) 0.1 % cream Apply topically as directed.     Social history  reports that she has never smoked. She does not have any smokeless tobacco history on file. She reports that she drinks alcohol. She reports that she does not use illicit drugs.  Review of Systems  Constitutional: Negative.   Eyes: Negative.   Respiratory: Negative.   Cardiovascular: Negative.   Gastrointestinal: Negative.   Musculoskeletal: Negative.   Neurological: Negative.   Hematological: Negative.   Psychiatric/Behavioral: Negative.   All other systems reviewed and are negative.   BP 128/62 mmHg  Pulse 86  Ht 5\' 4"  (1.626 m)  Wt 158 lb 12 oz (72.009 kg)  BMI 27.24 kg/m2  Physical Exam  Constitutional: She is oriented to person, place, and time. She appears well-developed and well-nourished.  HENT:  Head: Normocephalic.  Nose: Nose normal.  Mouth/Throat: Oropharynx is clear and moist.  Eyes: Conjunctivae are normal. Pupils are equal, round, and reactive to light.  Neck: Normal range of motion. Neck supple. No JVD present.  Cardiovascular: Normal rate, regular rhythm, S1 normal, S2 normal, normal heart sounds and intact distal pulses.  Exam reveals no gallop and no friction rub.   No murmur heard. Pulmonary/Chest: Effort normal and breath sounds normal. No respiratory distress. She has no wheezes. She has no rales. She exhibits no tenderness.  Abdominal: Soft. Bowel sounds are normal. She exhibits no distension. There is no tenderness.  Musculoskeletal: Normal range of motion. She exhibits no edema or tenderness.  Lymphadenopathy:    She has no cervical adenopathy.  Neurological: She is alert and oriented to person, place, and time. Coordination normal.  Skin: Skin is warm and dry. No rash noted. No erythema.  Psychiatric: She has a normal mood and affect. Her  behavior is normal. Judgment and thought content normal.    Assessment and Plan  Nursing note and vitals reviewed.

## 2014-11-25 NOTE — Assessment & Plan Note (Signed)
Unable to tolerate a statin. Suggested she stay on her gemfibrozil

## 2014-11-25 NOTE — Assessment & Plan Note (Signed)
Blood pressure is well controlled on today's visit. No changes made to the medications. 

## 2015-01-13 DIAGNOSIS — E780 Pure hypercholesterolemia: Secondary | ICD-10-CM | POA: Diagnosis not present

## 2015-01-13 DIAGNOSIS — E119 Type 2 diabetes mellitus without complications: Secondary | ICD-10-CM | POA: Diagnosis not present

## 2015-01-13 DIAGNOSIS — I10 Essential (primary) hypertension: Secondary | ICD-10-CM | POA: Diagnosis not present

## 2015-01-30 DIAGNOSIS — Z4789 Encounter for other orthopedic aftercare: Secondary | ICD-10-CM | POA: Diagnosis not present

## 2015-01-30 DIAGNOSIS — M170 Bilateral primary osteoarthritis of knee: Secondary | ICD-10-CM | POA: Diagnosis not present

## 2015-01-30 NOTE — Telephone Encounter (Signed)
Close  

## 2015-03-03 DIAGNOSIS — H2513 Age-related nuclear cataract, bilateral: Secondary | ICD-10-CM | POA: Diagnosis not present

## 2015-04-22 ENCOUNTER — Ambulatory Visit: Payer: Self-pay | Admitting: Orthopedic Surgery

## 2015-04-22 DIAGNOSIS — M170 Bilateral primary osteoarthritis of knee: Secondary | ICD-10-CM | POA: Diagnosis not present

## 2015-04-22 NOTE — H&P (Signed)
Renee [name] DOB: 02/13/1946 Divorced / Language: Cleophus Molt / Race: White Female Date of Admission: 05/21/2015 CC: Bilateral Knee Pain History of Present Illness The patient is a 69 year old female who comes in for a preoperative History and Physical. The patient is scheduled for a bilateral total knee arthroplasty to be performed by Dr. Dione Plover. Aluisio, MD at [facility] on 05-21-2015. The patient is a 69 year old female who presented with bilateral knee pain. The patient reports left knee and right knee symptoms including: pain, grinding and popping with pain which began 1 year(s) ago without any known injury. The patient describes the severity of the symptoms as severe. The patient describes their pain as sharp, dull, aching and throbbing.The patient feels that the symptoms are worsening. Past treatment for this problem has included intra-articular injection of corticosteroids, nonsteroidal anti-inflammatory drugs and non-opioid analgesics. Symptoms are reported to be located in the left knee and right knee and include knee pain and difficulty bearing weight. Onset of symptoms was gradual. Symptoms are exacerbated by weight bearing and walking. Current treatment includes nonsteroidal anti-inflammatory drugs and non-opioid analgesics. Pertinent medical history includes osteoarthritis. Risk factors include arthroscopic surgery. Symptoms present at the patient's previous evaluation included knee pain, difficulty bearing weight and difficulty ambulating. Note for "Knee pain": She also has had viscosupplementation of the knees without improvement. She had knee arthroscopy on the right only which helped some. She was referred by Dr. Tonita Cong. Unfortunately, both her knees are giving her problems at all times. Pain has been going on for over a year now. She did not have any injury leading to this. She is at a stage now where the knees have taken over her life. She cannot do as she desires. Both knees hurt  significantly. The right may be slightly worse than the left since she has had the arthroscopy. She was told she had significant arthritis in the knee at the time of her arthroscopy. She is at a point now where she would like to get the knees replaced. Risks and benefits of doing one versus both knees at the same time have been discussed and she has elected to proceed with bilateral knee replacements at this time. They have been treated conservatively in the past for the above stated problem and despite conservative measures, they continue to have progressive pain and severe functional limitations and dysfunction. They have failed non-operative management including home exercise, medications, and injections. It is felt that they would benefit from undergoing total joint replacement. Risks and benefits of the procedure have been discussed with the patient and they elect to proceed with surgery. There are no active contraindications to surgery such as ongoing infection or rapidly progressive neurological disease.  Problem List/Past Medical Bilateral knee pain (M25.561, M25.562) Primary osteoarthritis of right knee (M17.11) Hypercholesterolemia Hypothyroidism Diabetes Mellitus, Type II High blood pressure Coronary Artery Disease/Heart Disease- Nonobstructive  Allergies Pravastatin Sodium *ANTIHYPERLIPIDEMICS* Statins Depletion *DIETARY PRODUCTS/DIETARY MANAGEMENT PRODUCTS* Codeine/Codeine Derivatives She is able to take Hydrocodone. Percocet has made her sick before.  Family History  Heart disease in female family member before age 75 Cancer Mother. Heart Disease Brother, Sister. Heart disease in female family member before age 40 Hypertension Mother, Sister. Chronic Obstructive Lung Disease Brother. Congestive Heart Failure Brother. First Degree Relatives reported Cerebrovascular Accident Paternal 4, Sister. Diabetes Mellitus Brother, Mother, Sister.  Social  History Exercise Exercises weekly; does running / walking Marital status divorced Tobacco / smoke exposure 04/03/2014: no Living situation live alone  Children 5 or more Current work status retired Never consumed alcohol 04/03/2014: Never consumed alcohol Tobacco use Never smoker. 04/03/2014 No history of drug/alcohol rehab Number of flights of stairs before winded 1 Not under pain contract Post-Surgical Plans She wants to look into [facility].  Medication History  Gemfibrozil (600MG  Tablet, Oral) Active. Lisinopril (20MG  Tablet, Oral) Active. Triamcinolone Acetonide (Top) (0.025% Cream, External) Active. Levothyroxine Sodium (75MCG Tablet, Oral) Active. Aspirin (81MG  Tablet, 1 (one) Oral) Active. Nitrostat (0.4MG  Tab Sublingual, Sublingual) Active. Fish Oil Concentrate (1 (one) Oral) Specific dose unknown - Active. Clobetasol Propionate (0.05% Ointment, External) Active.  Past Surgical History Appendectomy Colon Polyp Removal - Colonoscopy Tubal Ligation S/P Arthroscopic Partial Medial Menisectomy (Z47.89)  Review of Systems General Not Present- Chills, Fatigue, Fever, Memory Loss, Night Sweats, Weight Gain and Weight Loss. Skin Not Present- Eczema, Hives, Itching, Lesions and Rash. HEENT Not Present- Dentures, Double Vision, Headache, Hearing Loss, Tinnitus and Visual Loss. Respiratory Not Present- Allergies, Chronic Cough, Coughing up blood, Shortness of breath at rest and Shortness of breath with exertion. Cardiovascular Not Present- Chest Pain, Difficulty Breathing Lying Down, Murmur, Palpitations, Racing/skipping heartbeats and Swelling. Gastrointestinal Not Present- Abdominal Pain, Bloody Stool, Constipation, Diarrhea, Difficulty Swallowing, Heartburn, Jaundice, Loss of appetitie, Nausea and Vomiting. Female Genitourinary Not Present- Blood in Urine, Discharge, Flank Pain, Incontinence, Painful Urination, Urgency, Urinary frequency, Urinary  Retention, Urinating at Night and Weak urinary stream. Musculoskeletal Present- Joint Pain. Not Present- Back Pain, Joint Swelling, Morning Stiffness, Muscle Pain, Muscle Weakness and Spasms. Neurological Not Present- Blackout spells, Difficulty with balance, Dizziness, Paralysis, Tremor and Weakness. Psychiatric Not Present- Insomnia.  Vitals Weight: 146 lb Height: 64.5in Weight was reported by patient. Height was reported by patient. Body Surface Area: 1.72 m Body Mass Index: 24.67 kg/m  BP: 140/68 (Sitting, Right Arm, Standard)  Physical Exam General Mental Status -Alert, cooperative and good historian. General Appearance-pleasant, Not in acute distress. Orientation-Oriented X3. Build & Nutrition-Well nourished and Well developed.  Head and Neck Head-normocephalic, atraumatic . Neck Global Assessment - supple, no bruit auscultated on the right, no bruit auscultated on the left.  Eye Pupil - Bilateral-Regular and Round. Motion - Bilateral-EOMI.  ENMT Note: upper and lower dentures with dental implant posts  Chest and Lung Exam Auscultation Breath sounds - clear at anterior chest wall and clear at posterior chest wall. Adventitious sounds - No Adventitious sounds.  Cardiovascular Auscultation Rhythm - Regular rate and rhythm. Heart Sounds - S1 WNL and S2 WNL. Murmurs & Other Heart Sounds - Auscultation of the heart reveals - No Murmurs.  Abdomen Palpation/Percussion Tenderness - Abdomen is non-tender to palpation. Rigidity (guarding) - Abdomen is soft. Auscultation Auscultation of the abdomen reveals - Bowel sounds normal.  Female Genitourinary Note: Not done, not pertinent to present illness  Musculoskeletal Note: On exam she is alert and oriented, in no apparent distress. Both hips show normal range of motion with no discomfort. Both knees show no effusion. Her range of motion is about 5 to 125 or 130 on each side. She has tenderness,  medial and lateral, with no instability noted. Pulses, sensation, and motor are intact, both lower extremities.  IMAGING: Radiographs, AP and lateral of both knees, were reviewed and she has bone-on-bone arthritis, medial and patellofemoral, of both knees.  Assessment & Plan  Primary osteoarthritis of both knees (M17.0) Note:Surgical Plans: Bilateral Total Knee Replacements  Disposition: CIR  PCP: Dr. Margarita Rana  Topical TXA  Anesthesia Issues: None  Signed electronically by Alexzandrew L  Dara Lords, III PA-C

## 2015-04-22 NOTE — H&P (Signed)
Renee [name] DOB: 1946-10-31 Divorced / Language: Cleophus Molt / Race: White Female Date of Admission:  05/21/2015 CC:  Bilateral Knee Pain History of Present Illness The patient is a 69 year old female who comes in for a preoperative History and Physical. The patient is scheduled for a bilateral total knee arthroplasty to be performed by Dr. Dione Plover. Aluisio, MD at [facility] on 05-21-2015. The patient is a 69 year old female who presented with bilateral knee pain. The patient reports left knee and right knee symptoms including: pain, grinding and popping with pain which began 1 year(s) ago without any known injury. The patient describes the severity of the symptoms as severe. The patient describes their pain as sharp, dull, aching and throbbing.The patient feels that the symptoms are worsening. Past treatment for this problem has included intra-articular injection of corticosteroids, nonsteroidal anti-inflammatory drugs and non-opioid analgesics. Symptoms are reported to be located in the left knee and right knee and include knee pain and difficulty bearing weight. Onset of symptoms was gradual. Symptoms are exacerbated by weight bearing and walking. Current treatment includes nonsteroidal anti-inflammatory drugs and non-opioid analgesics. Pertinent medical history includes osteoarthritis. Risk factors include arthroscopic surgery. Symptoms present at the patient's previous evaluation included knee pain, difficulty bearing weight and difficulty ambulating. Note for "Knee pain": She also has had viscosupplementation of the knees without improvement. She had knee arthroscopy on the right only which helped some. She was referred by Dr. Tonita Cong. Unfortunately, both her knees are giving her problems at all times. Pain has been going on for over a year now. She did not have any injury leading to this. She is at a stage now where the knees have taken over her life. She cannot do as she desires. Both knees hurt  significantly. The right may be slightly worse than the left since she has had the arthroscopy. She was told she had significant arthritis in the knee at the time of her arthroscopy. She is at a point now where she would like to get the knees replaced. Risks and benefits of doing one versus both knees at the same time have been discussed and she has elected to proceed with bilateral knee replacements at this time. They have been treated conservatively in the past for the above stated problem and despite conservative measures, they continue to have progressive pain and severe functional limitations and dysfunction. They have failed non-operative management including home exercise, medications, and injections. It is felt that they would benefit from undergoing total joint replacement. Risks and benefits of the procedure have been discussed with the patient and they elect to proceed with surgery. There are no active contraindications to surgery such as ongoing infection or rapidly progressive neurological disease.  Problem List/Past Medical Bilateral knee pain (M25.561, M25.562) Primary osteoarthritis of right knee (M17.11) Hypercholesterolemia Hypothyroidism Diabetes Mellitus, Type II High blood pressure Coronary Artery Disease/Heart Disease- Nonobstructive  Allergies Pravastatin Sodium *ANTIHYPERLIPIDEMICS* Statins Depletion *DIETARY PRODUCTS/DIETARY MANAGEMENT PRODUCTS* Codeine/Codeine Derivatives She is able to take Hydrocodone. Percocet has made her sick before.  Family History  Heart disease in female family member before age 71 Cancer Mother. Heart Disease Brother, Sister. Heart disease in female family member before age 33 Hypertension Mother, Sister. Chronic Obstructive Lung Disease Brother. Congestive Heart Failure Brother. First Degree Relatives reported Cerebrovascular Accident Paternal 34, Sister. Diabetes Mellitus Brother, Mother, Sister.  Social  History Exercise Exercises weekly; does running / walking Marital status divorced Tobacco / smoke exposure 04/03/2014: no Living situation  live alone Children 5 or more Current work status retired Never consumed alcohol 04/03/2014: Never consumed alcohol Tobacco use Never smoker. 04/03/2014 No history of drug/alcohol rehab Number of flights of stairs before winded 1 Not under pain contract Post-Surgical Plans She wants to look into [facility].  Medication History  Gemfibrozil (600MG  Tablet, Oral) Active. Lisinopril (20MG  Tablet, Oral) Active. Triamcinolone Acetonide (Top) (0.025% Cream, External) Active. Levothyroxine Sodium (75MCG Tablet, Oral) Active. Aspirin (81MG  Tablet, 1 (one) Oral) Active. Nitrostat (0.4MG  Tab Sublingual, Sublingual) Active. Fish Oil Concentrate (1 (one) Oral) Specific dose unknown - Active. Clobetasol Propionate (0.05% Ointment, External) Active.  Past Surgical History Appendectomy Colon Polyp Removal - Colonoscopy Tubal Ligation S/P Arthroscopic Partial Medial Menisectomy (Z47.89)  Review of Systems General Not Present- Chills, Fatigue, Fever, Memory Loss, Night Sweats, Weight Gain and Weight Loss. Skin Not Present- Eczema, Hives, Itching, Lesions and Rash. HEENT Not Present- Dentures, Double Vision, Headache, Hearing Loss, Tinnitus and Visual Loss. Respiratory Not Present- Allergies, Chronic Cough, Coughing up blood, Shortness of breath at rest and Shortness of breath with exertion. Cardiovascular Not Present- Chest Pain, Difficulty Breathing Lying Down, Murmur, Palpitations, Racing/skipping heartbeats and Swelling. Gastrointestinal Not Present- Abdominal Pain, Bloody Stool, Constipation, Diarrhea, Difficulty Swallowing, Heartburn, Jaundice, Loss of appetitie, Nausea and Vomiting. Female Genitourinary Not Present- Blood in Urine, Discharge, Flank Pain, Incontinence, Painful Urination, Urgency, Urinary frequency, Urinary  Retention, Urinating at Night and Weak urinary stream. Musculoskeletal Present- Joint Pain. Not Present- Back Pain, Joint Swelling, Morning Stiffness, Muscle Pain, Muscle Weakness and Spasms. Neurological Not Present- Blackout spells, Difficulty with balance, Dizziness, Paralysis, Tremor and Weakness. Psychiatric Not Present- Insomnia.  Vitals Weight: 146 lb Height: 64.5in Weight was reported by patient. Height was reported by patient. Body Surface Area: 1.72 m Body Mass Index: 24.67 kg/m  BP: 140/68 (Sitting, Right Arm, Standard)  Physical Exam General Mental Status -Alert, cooperative and good historian. General Appearance-pleasant, Not in acute distress. Orientation-Oriented X3. Build & Nutrition-Well nourished and Well developed.  Head and Neck Head-normocephalic, atraumatic . Neck Global Assessment - supple, no bruit auscultated on the right, no bruit auscultated on the left.  Eye Pupil - Bilateral-Regular and Round. Motion - Bilateral-EOMI.  ENMT Note: upper and lower dentures with dental implant posts  Chest and Lung Exam Auscultation Breath sounds - clear at anterior chest wall and clear at posterior chest wall. Adventitious sounds - No Adventitious sounds.  Cardiovascular Auscultation Rhythm - Regular rate and rhythm. Heart Sounds - S1 WNL and S2 WNL. Murmurs & Other Heart Sounds - Auscultation of the heart reveals - No Murmurs.  Abdomen Palpation/Percussion Tenderness - Abdomen is non-tender to palpation. Rigidity (guarding) - Abdomen is soft. Auscultation Auscultation of the abdomen reveals - Bowel sounds normal.  Female Genitourinary Note: Not done, not pertinent to present illness  Musculoskeletal Note: On exam she is alert and oriented, in no apparent distress. Both hips show normal range of motion with no discomfort. Both knees show no effusion. Her range of motion is about 5 to 125 or 130 on each side. She has tenderness,  medial and lateral, with no instability noted. Pulses, sensation, and motor are intact, both lower extremities.  IMAGING: Radiographs, AP and lateral of both knees, were reviewed and she has bone-on-bone arthritis, medial and patellofemoral, of both knees.  Assessment & Plan  Primary osteoarthritis of both knees (M17.0) Note:Surgical Plans: Bilateral Total Knee Replacements  Disposition: CIR  PCP: Dr. Margarita Rana  Topical TXA  Anesthesia Issues: None  Signed electronically by  Sophiarose Eades Monika Salk, III PA-C

## 2015-05-06 ENCOUNTER — Ambulatory Visit: Payer: Self-pay | Admitting: Orthopedic Surgery

## 2015-05-06 NOTE — Progress Notes (Signed)
Preoperative surgical orders have been place into the Epic hospital system for Renee [name] on 05/06/2015, 8:43 AM  by Mickel Crow for surgery on 05-21-2015.  Preop Bilateral Total Knee orders including Epidural per Anesthesia, IV Tylenol, and IV Decadron as long as there are no contraindications to the above medications. [name], PA-C

## 2015-05-12 DIAGNOSIS — E039 Hypothyroidism, unspecified: Secondary | ICD-10-CM | POA: Diagnosis not present

## 2015-05-12 DIAGNOSIS — I10 Essential (primary) hypertension: Secondary | ICD-10-CM | POA: Diagnosis not present

## 2015-05-12 DIAGNOSIS — E780 Pure hypercholesterolemia: Secondary | ICD-10-CM | POA: Diagnosis not present

## 2015-05-12 DIAGNOSIS — E119 Type 2 diabetes mellitus without complications: Secondary | ICD-10-CM | POA: Diagnosis not present

## 2015-05-15 NOTE — Patient Instructions (Signed)
YOUR PROCEDURE IS SCHEDULED ON :  06/12/15  REPORT TO [facility] MAIN ENTRANCE FOLLOW SIGNS TO SHORT STAY CENTER AT :  7:30 am  CALL THIS NUMBER IF YOU HAVE PROBLEMS THE MORNING OF SURGERY [phone]  REMEMBER:  DO NOT EAT FOOD OR DRINK LIQUIDS AFTER MIDNIGHT  TAKE THESE MEDICINES THE MORNING OF SURGERY:  YOU MAY NOT HAVE ANY METAL ON YOUR BODY INCLUDING HAIR PINS AND PIERCING'S. DO NOT WEAR JEWELRY, MAKEUP, LOTIONS, POWDERS OR PERFUMES. DO NOT WEAR NAIL POLISH. DO NOT SHAVE 48 HRS PRIOR TO SURGERY. MEN MAY SHAVE FACE AND NECK.  DO NOT [place]. [facility] IS NOT RESPONSIBLE FOR VALUABLES.  CONTACTS, DENTURES OR PARTIALS MAY NOT BE WORN TO SURGERY. LEAVE SUITCASE IN CAR. CAN BE BROUGHT TO ROOM AFTER SURGERY.  PATIENTS DISCHARGED THE DAY OF SURGERY WILL NOT BE ALLOWED TO DRIVE HOME.  PLEASE READ OVER THE FOLLOWING INSTRUCTION SHEETS _________________________________________________________________________________                                          [place] - PREPARING FOR SURGERY  Before surgery, you can play an important role.  Because skin is not sterile, your skin needs to be as free of germs as possible.  You can reduce the number of germs on your skin by washing with CHG (chlorahexidine gluconate) soap before surgery.  CHG is an antiseptic cleaner which kills germs and bonds with the skin to continue killing germs even after washing. Please DO NOT use if you have an allergy to CHG or antibacterial soaps.  If your skin becomes reddened/irritated stop using the CHG and inform your nurse when you arrive at Short Stay. Do not shave (including legs and underarms) for at least 48 hours prior to the first CHG shower.  You may shave your face. Please follow these instructions carefully:   1.  Shower with CHG Soap the night before surgery and the  morning of Surgery.   2.  If you choose to wash your hair, wash your hair first as usual  with your  normal  Shampoo.   3.  After you shampoo, rinse your hair and body thoroughly to remove the  shampoo.                                         4.  Use CHG as you would any other liquid soap.  You can apply chg directly  to the skin and wash . Gently wash with scrungie or clean wascloth    5.  Apply the CHG Soap to your body ONLY FROM THE NECK DOWN.   Do not use on open                           Wound or open sores. Avoid contact with eyes, ears mouth and genitals (private parts).                        Genitals (private parts) with your normal soap.              6.  Wash thoroughly, paying special attention to the area where your surgery  will be performed.   7.  Thoroughly rinse your body with warm water from the neck down.   8.  DO NOT shower/wash with your normal soap after using and rinsing off  the CHG Soap .                9.  Pat yourself dry with a clean towel.             10.  Wear clean night clothes to bed after shower             11.  Place clean sheets on your bed the night of your first shower and do not  sleep with pets.  Day of Surgery : Do not apply any lotions/deodorants the morning of surgery.  Please wear clean clothes to the hospital/surgery center.  FAILURE TO FOLLOW THESE INSTRUCTIONS MAY RESULT IN THE CANCELLATION OF YOUR SURGERY    PATIENT SIGNATURE_________________________________  ______________________________________________________________________     Renee [name]  An incentive spirometer is a tool that can help keep your lungs clear and active. This tool measures how well you are filling your lungs with each breath. Taking long deep breaths may help reverse or decrease the chance of developing breathing (pulmonary) problems (especially infection) following:  A long period of time when you are unable to move or be active. BEFORE THE PROCEDURE   If the spirometer includes an indicator to show your best effort, your nurse or  respiratory therapist will set it to a desired goal.  If possible, sit up straight or lean slightly forward. Try not to slouch.  Hold the incentive spirometer in an upright position. INSTRUCTIONS FOR USE   Sit on the edge of your bed if possible, or sit up as far as you can in bed or on a chair.  Hold the incentive spirometer in an upright position.  Breathe out normally.  Place the mouthpiece in your mouth and seal your lips tightly around it.  Breathe in slowly and as deeply as possible, raising the piston or the ball toward the top of the column.  Hold your breath for 3-5 seconds or for as long as possible. Allow the piston or ball to fall to the bottom of the column.  Remove the mouthpiece from your mouth and breathe out normally.  Rest for a few seconds and repeat Steps 1 through 7 at least 10 times every 1-2 hours when you are awake. Take your time and take a few normal breaths between deep breaths.  The spirometer may include an indicator to show your best effort. Use the indicator as a goal to work toward during each repetition.  After each set of 10 deep breaths, practice coughing to be sure your lungs are clear. If you have an incision (the cut made at the time of surgery), support your incision when coughing by placing a pillow or rolled up towels firmly against it. Once you are able to get out of bed, walk around indoors and cough well. You may stop using the incentive spirometer when instructed by your caregiver.  RISKS AND COMPLICATIONS  Take your time so you do not get dizzy or light-headed.  If you are in pain, you may need to take or ask for pain medication before doing incentive spirometry. It is harder to take a deep breath if you are having pain. AFTER USE  Rest and breathe slowly and easily.  It can be helpful to keep track of a log of your progress. Your caregiver can provide you with a  simple table to help with this. If you are using the spirometer at home,  follow these instructions: [place] IF:   You are having difficultly using the spirometer.  You have trouble using the spirometer as often as instructed.  Your pain medication is not giving enough relief while using the spirometer.  You develop fever of 100.5 F (38.1 C) or higher. SEEK IMMEDIATE MEDICAL CARE IF:   You cough up bloody sputum that had not been present before.  You develop fever of 102 F (38.9 C) or greater.  You develop worsening pain at or near the incision site. MAKE SURE YOU:   Understand these instructions.  Will watch your condition.  Will get help right away if you are not doing well or get worse. Document Released: 04/25/2007 Document Revised: 03/06/2012 Document Reviewed: 06/26/2007 ExitCare Patient Information 2014 ExitCare, [place].   ________________________________________________________________________  WHAT IS A BLOOD TRANSFUSION? Blood Transfusion Information  A transfusion is the replacement of blood or some of its parts. Blood is made up of multiple cells which provide different functions.  Red blood cells carry oxygen and are used for blood loss replacement.  White blood cells fight against infection.  Platelets control bleeding.  Plasma helps clot blood.  Other blood products are available for specialized needs, such as hemophilia or other clotting disorders. BEFORE THE TRANSFUSION  Who gives blood for transfusions?   Healthy volunteers who are fully evaluated to make sure their blood is safe. This is blood bank blood. Transfusion therapy is the safest it has ever been in the practice of medicine. Before blood is taken from a donor, a complete history is taken to make sure that person has no history of diseases nor engages in risky social behavior (examples are intravenous drug use or sexual activity with multiple partners). The donor's travel history is screened to minimize risk of transmitting infections, such as malaria.  The donated blood is tested for signs of infectious diseases, such as HIV and hepatitis. The blood is then tested to be sure it is compatible with you in order to minimize the chance of a transfusion reaction. If you or a relative donates blood, this is often done in anticipation of surgery and is not appropriate for emergency situations. It takes many days to process the donated blood. RISKS AND COMPLICATIONS Although transfusion therapy is very safe and saves many lives, the main dangers of transfusion include:   Getting an infectious disease.  Developing a transfusion reaction. This is an allergic reaction to something in the blood you were given. Every precaution is taken to prevent this. The decision to have a blood transfusion has been considered carefully by your caregiver before blood is given. Blood is not given unless the benefits outweigh the risks. AFTER THE TRANSFUSION  Right after receiving a blood transfusion, you will usually feel much better and more energetic. This is especially true if your red blood cells have gotten low (anemic). The transfusion raises the level of the red blood cells which carry oxygen, and this usually causes an energy increase.  The nurse administering the transfusion will monitor you carefully for complications. HOME CARE INSTRUCTIONS  No special instructions are needed after a transfusion. You may find your energy is better. Speak with your caregiver about any limitations on activity for underlying diseases you may have. SEEK MEDICAL CARE IF:   Your condition is not improving after your transfusion.  You develop redness or irritation at the intravenous (IV) site. SEEK IMMEDIATE  MEDICAL CARE IF:  Any of the following symptoms occur over the next 12 hours:  Shaking chills.  You have a temperature by mouth above 102 F (38.9 C), not controlled by medicine.  Chest, back, or muscle pain.  People around you feel you are not acting correctly or are  confused.  Shortness of breath or difficulty breathing.  Dizziness and fainting.  You get a rash or develop hives.  You have a decrease in urine output.  Your urine turns a dark color or changes to pink, red, or brown. Any of the following symptoms occur over the next 10 days:  You have a temperature by mouth above 102 F (38.9 C), not controlled by medicine.  Shortness of breath.  Weakness after normal activity.  The white part of the eye turns yellow (jaundice).  You have a decrease in the amount of urine or are urinating less often.  Your urine turns a dark color or changes to pink, red, or brown. Document Released: 12/10/2000 Document Revised: 03/06/2012 Document Reviewed: 07/29/2008 [facility] Patient Information 2014 [place], [place].  _______________________________________________________________________

## 2015-05-16 ENCOUNTER — Inpatient Hospital Stay (HOSPITAL_COMMUNITY)
Admission: RE | Admit: 2015-05-16 | Discharge: 2015-05-16 | Disposition: A | Discharge status: Home / Self Care | Payer: Medicare Other | Source: Ambulatory Visit

## 2015-05-27 ENCOUNTER — Ambulatory Visit: Payer: Self-pay | Admitting: Orthopedic Surgery

## 2015-05-27 NOTE — Progress Notes (Signed)
Preoperative surgical orders have been place into the Epic hospital system for Renee [name] on 05/27/2015, 5:27 PM  by Mickel Crow for surgery on 06-12-2015.  Preop Bilateral Total Knee orders including Epidural per Anesthesia, IV Tylenol, and IV Decadron as long as there are no contraindications to the above medications. [name], PA-C

## 2015-06-06 ENCOUNTER — Encounter (HOSPITAL_COMMUNITY): Payer: Self-pay

## 2015-06-06 ENCOUNTER — Encounter (HOSPITAL_COMMUNITY)
Admission: RE | Admit: 2015-06-06 | Discharge: 2015-06-06 | Disposition: A | Discharge status: Home / Self Care | Payer: Medicare Other | Source: Ambulatory Visit | Attending: Orthopedic Surgery | Admitting: Orthopedic Surgery

## 2015-06-06 DIAGNOSIS — M170 Bilateral primary osteoarthritis of knee: Secondary | ICD-10-CM | POA: Diagnosis not present

## 2015-06-06 DIAGNOSIS — Z01818 Encounter for other preprocedural examination: Secondary | ICD-10-CM | POA: Diagnosis not present

## 2015-06-06 DIAGNOSIS — Z7901 Long term (current) use of anticoagulants: Secondary | ICD-10-CM | POA: Insufficient documentation

## 2015-06-06 HISTORY — DX: Unspecified osteoarthritis, unspecified site: M19.90

## 2015-06-06 HISTORY — DX: Urinary tract infection, site not specified: N39.0

## 2015-06-06 HISTORY — DX: Personal history of other mental and behavioral disorders: Z86.59

## 2015-06-06 HISTORY — DX: Essential (primary) hypertension: I10

## 2015-06-06 HISTORY — DX: Psoriasis, unspecified: L40.9

## 2015-06-06 HISTORY — DX: Calculus of kidney: N20.0

## 2015-06-06 LAB — CBC
HCT: 39.6 % (ref 36.0–46.0)
HEMOGLOBIN: 12.6 g/dL (ref 12.0–15.0)
MCH: 30.2 pg (ref 26.0–34.0)
MCHC: 31.8 g/dL (ref 30.0–36.0)
MCV: 95 fL (ref 78.0–100.0)
Platelets: 395 10*3/uL (ref 150–400)
RBC: 4.17 MIL/uL (ref 3.87–5.11)
RDW: 12.9 % (ref 11.5–15.5)
WBC: 6 10*3/uL (ref 4.0–10.5)

## 2015-06-06 LAB — COMPREHENSIVE METABOLIC PANEL
ALK PHOS: 65 U/L (ref 38–126)
ALT: 21 U/L (ref 14–54)
AST: 26 U/L (ref 15–41)
Albumin: 4.4 g/dL (ref 3.5–5.0)
Anion gap: 9 (ref 5–15)
BUN: 25 mg/dL — ABNORMAL HIGH (ref 6–20)
CO2: 27 mmol/L (ref 22–32)
Calcium: 9.8 mg/dL (ref 8.9–10.3)
Chloride: 104 mmol/L (ref 101–111)
Creatinine, Ser: 0.66 mg/dL (ref 0.44–1.00)
GFR calc Af Amer: >60 mL/min (ref >60)
GFR calc non Af Amer: >60 mL/min (ref >60)
Glucose, Bld: 128 mg/dL — ABNORMAL HIGH (ref 65–99)
Potassium: 4.7 mmol/L (ref 3.5–5.1)
Sodium: 140 mmol/L (ref 135–145)
TOTAL PROTEIN: 7.9 g/dL (ref 6.5–8.1)
Total Bilirubin: 0.3 mg/dL (ref 0.3–1.2)

## 2015-06-06 LAB — URINALYSIS, ROUTINE W REFLEX MICROSCOPIC
Bilirubin Urine: NEGATIVE
GLUCOSE, UA: NEGATIVE mg/dL
Ketones, ur: NEGATIVE mg/dL
NITRITE: NEGATIVE
PROTEIN: NEGATIVE mg/dL
SPECIFIC GRAVITY, URINE: 1.018 (ref 1.005–1.030)
Urobilinogen, UA: 0.2 mg/dL (ref 0.0–1.0)
pH: 5.5 (ref 5.0–8.0)

## 2015-06-06 LAB — URINE MICROSCOPIC-ADD ON

## 2015-06-06 LAB — ABO/RH: ABO/RH(D): A POS

## 2015-06-06 LAB — SURGICAL PCR SCREEN
MRSA, PCR: NEGATIVE
STAPHYLOCOCCUS AUREUS: NEGATIVE

## 2015-06-06 LAB — APTT: aPTT: 39 seconds — ABNORMAL HIGH (ref 24–37)

## 2015-06-06 LAB — PROTIME-INR
INR: 0.97 (ref 0.00–1.49)
Prothrombin Time: 13.1 seconds (ref 11.6–15.2)

## 2015-06-06 NOTE — Patient Instructions (Signed)
SALEHA KALP  06/06/2015   Your procedure is scheduled on: Thursday June 12, 2015  Report to [facility] Main  Entrance and follow signs to               [place] arrive at 7:30 AM.  Call this number if you have problems the morning of surgery [phone]   Remember: ONLY 1 PERSON MAY GO WITH YOU TO SHORT STAY TO GET  READY MORNING OF [place].  Do not eat food or drink liquids :After Midnight.     Take these medicines the morning of surgery with A SIP OF WATER: Levothyroxine                                You may not have any metal on your body including hair pins and              piercings  Do not wear jewelry, make-up, lotions, powders or perfumes, deodorant             Do not wear nail polish.  Do not shave  48 hours prior to surgery.             Do not bring valuables to the hospital. [place].  Contacts, dentures or bridgework may not be worn into surgery.  Leave suitcase in the car. After surgery it may be brought to your room.                  Please read over the following fact sheets you were given:MRSA INFORMATION SHEET; INCENTIVE SPIROMETER; BLOOD TRANSFUSION FACT SHEET  _____________________________________________________________________             [facility] - Preparing for Surgery Before surgery, you can play an important role.  Because skin is not sterile, your skin needs to be as free of germs as possible.  You can reduce the number of germs on your skin by washing with CHG (chlorahexidine gluconate) soap before surgery.  CHG is an antiseptic cleaner which kills germs and bonds with the skin to continue killing germs even after washing. Please DO NOT use if you have an allergy to CHG or antibacterial soaps.  If your skin becomes reddened/irritated stop using the CHG and inform your nurse when you arrive at Short Stay. Do not shave (including legs and underarms) for at least 48  hours prior to the first CHG shower.  You may shave your face/neck. Please follow these instructions carefully:  1.  Shower with CHG Soap the night before surgery and the  morning of Surgery.  2.  If you choose to wash your hair, wash your hair first as usual with your  normal  shampoo.  3.  After you shampoo, rinse your hair and body thoroughly to remove the  shampoo.                           4.  Use CHG as you would any other liquid soap.  You can apply chg directly  to the skin and wash                       Gently with a  scrungie or clean washcloth.  5.  Apply the CHG Soap to your body ONLY FROM THE NECK DOWN.   Do not use on face/ open                           Wound or open sores. Avoid contact with eyes, ears mouth and genitals (private parts).                       Wash face,  Genitals (private parts) with your normal soap.             6.  Wash thoroughly, paying special attention to the area where your surgery  will be performed.  7.  Thoroughly rinse your body with warm water from the neck down.  8.  DO NOT shower/wash with your normal soap after using and rinsing off  the CHG Soap.                9.  Pat yourself dry with a clean towel.            10.  Wear clean pajamas.            11.  Place clean sheets on your bed the night of your first shower and do not  sleep with pets. Day of Surgery : Do not apply any lotions/deodorants the morning of surgery.  Please wear clean clothes to the hospital/surgery center.  FAILURE TO FOLLOW THESE INSTRUCTIONS MAY RESULT IN THE CANCELLATION OF YOUR SURGERY PATIENT SIGNATURE_________________________________  NURSE SIGNATURE__________________________________  ________________________________________________________________________   Adam Phenix  An incentive spirometer is a tool that can help keep your lungs clear and active. This tool measures how well you are filling your lungs with each breath. Taking long deep breaths may help  reverse or decrease the chance of developing breathing (pulmonary) problems (especially infection) following:  A long period of time when you are unable to move or be active. BEFORE THE PROCEDURE   If the spirometer includes an indicator to show your best effort, your nurse or respiratory therapist will set it to a desired goal.  If possible, sit up straight or lean slightly forward. Try not to slouch.  Hold the incentive spirometer in an upright position. INSTRUCTIONS FOR USE   Sit on the edge of your bed if possible, or sit up as far as you can in bed or on a chair.  Hold the incentive spirometer in an upright position.  Breathe out normally.  Place the mouthpiece in your mouth and seal your lips tightly around it.  Breathe in slowly and as deeply as possible, raising the piston or the ball toward the top of the column.  Hold your breath for 3-5 seconds or for as long as possible. Allow the piston or ball to fall to the bottom of the column.  Remove the mouthpiece from your mouth and breathe out normally.  Rest for a few seconds and repeat Steps 1 through 7 at least 10 times every 1-2 hours when you are awake. Take your time and take a few normal breaths between deep breaths.  The spirometer may include an indicator to show your best effort. Use the indicator as a goal to work toward during each repetition.  After each set of 10 deep breaths, practice coughing to be sure your lungs are clear. If you have an incision (the cut made at the time of surgery), support your incision  when coughing by placing a pillow or rolled up towels firmly against it. Once you are able to get out of bed, walk around indoors and cough well. You may stop using the incentive spirometer when instructed by your caregiver.  RISKS AND COMPLICATIONS  Take your time so you do not get dizzy or light-headed.  If you are in pain, you may need to take or ask for pain medication before doing incentive spirometry.  It is harder to take a deep breath if you are having pain. AFTER USE  Rest and breathe slowly and easily.  It can be helpful to keep track of a log of your progress. Your caregiver can provide you with a simple table to help with this. If you are using the spirometer at home, follow these instructions: [place] IF:   You are having difficultly using the spirometer.  You have trouble using the spirometer as often as instructed.  Your pain medication is not giving enough relief while using the spirometer.  You develop fever of 100.5 F (38.1 C) or higher. SEEK IMMEDIATE MEDICAL CARE IF:   You cough up bloody sputum that had not been present before.  You develop fever of 102 F (38.9 C) or greater.  You develop worsening pain at or near the incision site. MAKE SURE YOU:   Understand these instructions.  Will watch your condition.  Will get help right away if you are not doing well or get worse. Document Released: 04/25/2007 Document Revised: 03/06/2012 Document Reviewed: 06/26/2007 ExitCare Patient Information 2014 ExitCare, [place].   ________________________________________________________________________  WHAT IS A BLOOD TRANSFUSION? Blood Transfusion Information  A transfusion is the replacement of blood or some of its parts. Blood is made up of multiple cells which provide different functions.  Red blood cells carry oxygen and are used for blood loss replacement.  White blood cells fight against infection.  Platelets control bleeding.  Plasma helps clot blood.  Other blood products are available for specialized needs, such as hemophilia or other clotting disorders. BEFORE THE TRANSFUSION  Who gives blood for transfusions?   Healthy volunteers who are fully evaluated to make sure their blood is safe. This is blood bank blood. Transfusion therapy is the safest it has ever been in the practice of medicine. Before blood is taken from a donor, a complete  history is taken to make sure that person has no history of diseases nor engages in risky social behavior (examples are intravenous drug use or sexual activity with multiple partners). The donor's travel history is screened to minimize risk of transmitting infections, such as malaria. The donated blood is tested for signs of infectious diseases, such as HIV and hepatitis. The blood is then tested to be sure it is compatible with you in order to minimize the chance of a transfusion reaction. If you or a relative donates blood, this is often done in anticipation of surgery and is not appropriate for emergency situations. It takes many days to process the donated blood. RISKS AND COMPLICATIONS Although transfusion therapy is very safe and saves many lives, the main dangers of transfusion include:   Getting an infectious disease.  Developing a transfusion reaction. This is an allergic reaction to something in the blood you were given. Every precaution is taken to prevent this. The decision to have a blood transfusion has been considered carefully by your caregiver before blood is given. Blood is not given unless the benefits outweigh the risks. AFTER THE TRANSFUSION  Right after  receiving a blood transfusion, you will usually feel much better and more energetic. This is especially true if your red blood cells have gotten low (anemic). The transfusion raises the level of the red blood cells which carry oxygen, and this usually causes an energy increase.  The nurse administering the transfusion will monitor you carefully for complications. HOME CARE INSTRUCTIONS  No special instructions are needed after a transfusion. You may find your energy is better. Speak with your caregiver about any limitations on activity for underlying diseases you may have. SEEK MEDICAL CARE IF:   Your condition is not improving after your transfusion.  You develop redness or irritation at the intravenous (IV) site. SEEK  IMMEDIATE MEDICAL CARE IF:  Any of the following symptoms occur over the next 12 hours:  Shaking chills.  You have a temperature by mouth above 102 F (38.9 C), not controlled by medicine.  Chest, back, or muscle pain.  People around you feel you are not acting correctly or are confused.  Shortness of breath or difficulty breathing.  Dizziness and fainting.  You get a rash or develop hives.  You have a decrease in urine output.  Your urine turns a dark color or changes to pink, red, or brown. Any of the following symptoms occur over the next 10 days:  You have a temperature by mouth above 102 F (38.9 C), not controlled by medicine.  Shortness of breath.  Weakness after normal activity.  The white part of the eye turns yellow (jaundice).  You have a decrease in the amount of urine or are urinating less often.  Your urine turns a dark color or changes to pink, red, or brown. Document Released: 12/10/2000 Document Revised: 03/06/2012 Document Reviewed: 07/29/2008 [facility] Patient Information 2014 [place], [place].  _______________________________________________________________________

## 2015-06-06 NOTE — Progress Notes (Addendum)
OV note per epic per Dr Rockey Situ / cardiology 11/25/2014 - clearance noted; OV note discussed ECHO (epic) and heart cath from 2011  EKG per epic 09/04/2014

## 2015-06-06 NOTE — H&P (Signed)
Renee [name] DOB: Nov 07, 1946 Divorced / Language: Cleophus Molt / Race: White Female Date of Admission: 06-12-2015 CC: Bilateral Knee Pain History of Present Illness The patient is a 69 year old female who comes in for a preoperative History and Physical. The patient is scheduled for a bilateral total knee arthroplasty to be performed by Dr. Dione Plover. Aluisio, MD at [facility] on 06-12-2015. The surgery was originally scheduled on 5/25 but was rescheduled to 6/16. The patient is a 69 year old female who presented with bilateral knee pain. The patient reports left knee and right knee symptoms including: pain, grinding and popping with pain which began 1 year(s) ago without any known injury. The patient describes the severity of the symptoms as severe. The patient describes their pain as sharp, dull, aching and throbbing.The patient feels that the symptoms are worsening. Past treatment for this problem has included intra-articular injection of corticosteroids, nonsteroidal anti-inflammatory drugs and non-opioid analgesics. Symptoms are reported to be located in the left knee and right knee and include knee pain and difficulty bearing weight. Onset of symptoms was gradual. Symptoms are exacerbated by weight bearing and walking. Current treatment includes nonsteroidal anti-inflammatory drugs and non-opioid analgesics. Pertinent medical history includes osteoarthritis. Risk factors include arthroscopic surgery. Symptoms present at the patient's previous evaluation included knee pain, difficulty bearing weight and difficulty ambulating. Note for "Knee pain": She also has had viscosupplementation of the knees without improvement. She had knee arthroscopy on the right only which helped some. She was referred by Dr. Tonita Cong. Unfortunately, both her knees are giving her problems at all times. Pain has been going on for over a year now. She did not have any injury leading to this. She is at a stage now where the knees  have taken over her life. She cannot do as she desires. Both knees hurt significantly. The right may be slightly worse than the left since she has had the arthroscopy. She was told she had significant arthritis in the knee at the time of her arthroscopy. She is at a point now where she would like to get the knees replaced. Risks and benefits of doing one versus both knees at the same time have been discussed and she has elected to proceed with bilateral knee replacements at this time. They have been treated conservatively in the past for the above stated problem and despite conservative measures, they continue to have progressive pain and severe functional limitations and dysfunction. They have failed non-operative management including home exercise, medications, and injections. It is felt that they would benefit from undergoing total joint replacement. Risks and benefits of the procedure have been discussed with the patient and they elect to proceed with surgery. There are no active contraindications to surgery such as ongoing infection or rapidly progressive neurological disease.  Problem List/Past Medical Bilateral knee pain (M25.561, M25.562) Primary osteoarthritis of right knee (M17.11) Hypercholesterolemia Hypothyroidism Diabetes Mellitus, Type II High blood pressure Coronary Artery Disease/Heart Disease- Nonobstructive  Allergies Pravastatin Sodium *ANTIHYPERLIPIDEMICS* Statins Depletion *DIETARY PRODUCTS/DIETARY MANAGEMENT PRODUCTS* Codeine/Codeine Derivatives She is able to take Hydrocodone. Percocet has made her sick before.  Family History  Heart disease in female family member before age 51 Cancer Mother. Heart Disease Brother, Sister. Heart disease in female family member before age 66 Hypertension Mother, Sister. Chronic Obstructive Lung Disease Brother. Congestive Heart Failure Brother. First Degree Relatives reported Cerebrovascular Accident Paternal  72, Sister. Diabetes Mellitus Brother, Mother, Sister.  Social History Exercise Exercises weekly; does running / walking Marital  status divorced Tobacco / smoke exposure 04/03/2014: no Living situation live alone Children 5 or more Current work status retired Never consumed alcohol 04/03/2014: Never consumed alcohol Tobacco use Never smoker. 04/03/2014 No history of drug/alcohol rehab Number of flights of stairs before winded 1 Not under pain contract Post-Surgical Plans She wants to look into [facility].  Medication History  Gemfibrozil (600MG  Tablet, Oral) Active. Lisinopril (20MG  Tablet, Oral) Active. Triamcinolone Acetonide (Top) (0.025% Cream, External) Active. Levothyroxine Sodium (75MCG Tablet, Oral) Active. Aspirin (81MG  Tablet, 1 (one) Oral) Active. Nitrostat (0.4MG  Tab Sublingual, Sublingual) Active. Fish Oil Concentrate (1 (one) Oral) Specific dose unknown - Active. Clobetasol Propionate (0.05% Ointment, External) Active.  Past Surgical History Appendectomy Colon Polyp Removal - Colonoscopy Tubal Ligation S/P Arthroscopic Partial Medial Menisectomy (Z47.89)  Review of Systems General Not Present- Chills, Fatigue, Fever, Memory Loss, Night Sweats, Weight Gain and Weight Loss. Skin Not Present- Eczema, Hives, Itching, Lesions and Rash. HEENT Not Present- Dentures, Double Vision, Headache, Hearing Loss, Tinnitus and Visual Loss. Respiratory Not Present- Allergies, Chronic Cough, Coughing up blood, Shortness of breath at rest and Shortness of breath with exertion. Cardiovascular Not Present- Chest Pain, Difficulty Breathing Lying Down, Murmur, Palpitations, Racing/skipping heartbeats and Swelling. Gastrointestinal Not Present- Abdominal Pain, Bloody Stool, Constipation, Diarrhea, Difficulty Swallowing, Heartburn, Jaundice, Loss of appetitie, Nausea and Vomiting. Female Genitourinary Not Present- Blood in Urine, Discharge, Flank  Pain, Incontinence, Painful Urination, Urgency, Urinary frequency, Urinary Retention, Urinating at Night and Weak urinary stream. Musculoskeletal Present- Joint Pain. Not Present- Back Pain, Joint Swelling, Morning Stiffness, Muscle Pain, Muscle Weakness and Spasms. Neurological Not Present- Blackout spells, Difficulty with balance, Dizziness, Paralysis, Tremor and Weakness. Psychiatric Not Present- Insomnia.  Vitals Weight: 146 lb Height: 64.5in Weight was reported by patient. Height was reported by patient. Body Surface Area: 1.72 m Body Mass Index: 24.67 kg/m  BP: 140/68 (Sitting, Right Arm, Standard)  Physical Exam General Mental Status -Alert, cooperative and good historian. General Appearance-pleasant, Not in acute distress. Orientation-Oriented X3. Build & Nutrition-Well nourished and Well developed.  Head and Neck Head-normocephalic, atraumatic . Neck Global Assessment - supple, no bruit auscultated on the right, no bruit auscultated on the left.  Eye Pupil - Bilateral-Regular and Round. Motion - Bilateral-EOMI.  ENMT Note: upper and lower dentures with dental implant posts  Chest and Lung Exam Auscultation Breath sounds - clear at anterior chest wall and clear at posterior chest wall. Adventitious sounds - No Adventitious sounds.  Cardiovascular Auscultation Rhythm - Regular rate and rhythm. Heart Sounds - S1 WNL and S2 WNL. Murmurs & Other Heart Sounds - Auscultation of the heart reveals - No Murmurs.  Abdomen Palpation/Percussion Tenderness - Abdomen is non-tender to palpation. Rigidity (guarding) - Abdomen is soft. Auscultation Auscultation of the abdomen reveals - Bowel sounds normal.  Female Genitourinary Note: Not done, not pertinent to present illness  Musculoskeletal Note: On exam she is alert and oriented, in no apparent distress. Both hips show normal range of motion with no discomfort. Both knees show no effusion. Her  range of motion is about 5 to 125 or 130 on each side. She has tenderness, medial and lateral, with no instability noted. Pulses, sensation, and motor are intact, both lower extremities.  IMAGING: Radiographs, AP and lateral of both knees, were reviewed and she has bone-on-bone arthritis, medial and patellofemoral, of both knees.  Assessment & Plan  Primary osteoarthritis of both knees (M17.0) Note:Surgical Plans: Bilateral Total Knee Replacements  Disposition: CIR  PCP: Dr. Margarita Rana  Topical TXA  Anesthesia Issues: None  Signed electronically by Joelene Millin, III PA-C

## 2015-06-06 NOTE — Progress Notes (Signed)
PTT results in epic per PAT visit 06/06/2015 sent to Dr Wynelle Link

## 2015-06-09 NOTE — Progress Notes (Signed)
CMP results in epic per PAT visit 06/06/2015 sent to Dr Wynelle Link

## 2015-06-12 ENCOUNTER — Inpatient Hospital Stay (HOSPITAL_COMMUNITY): Payer: Medicare Other | Admitting: Anesthesiology

## 2015-06-12 ENCOUNTER — Encounter (HOSPITAL_COMMUNITY)
Admission: RE | Disposition: A | Discharge status: Skilled Nursing Facility | Payer: Self-pay | Source: Ambulatory Visit | Attending: Orthopedic Surgery

## 2015-06-12 ENCOUNTER — Inpatient Hospital Stay (HOSPITAL_COMMUNITY)
Admission: RE | Admit: 2015-06-12 | Discharge: 2015-06-17 | DRG: 462 | Disposition: A | Discharge status: Skilled Nursing Facility | Payer: Medicare Other | Source: Ambulatory Visit | Attending: Orthopedic Surgery | Admitting: Orthopedic Surgery

## 2015-06-12 ENCOUNTER — Encounter (HOSPITAL_COMMUNITY): Payer: Self-pay | Admitting: *Deleted

## 2015-06-12 DIAGNOSIS — Z79899 Other long term (current) drug therapy: Secondary | ICD-10-CM | POA: Diagnosis not present

## 2015-06-12 DIAGNOSIS — I2510 Atherosclerotic heart disease of native coronary artery without angina pectoris: Secondary | ICD-10-CM | POA: Diagnosis present

## 2015-06-12 DIAGNOSIS — R278 Other lack of coordination: Secondary | ICD-10-CM | POA: Diagnosis not present

## 2015-06-12 DIAGNOSIS — M179 Osteoarthritis of knee, unspecified: Secondary | ICD-10-CM | POA: Diagnosis present

## 2015-06-12 DIAGNOSIS — I10 Essential (primary) hypertension: Secondary | ICD-10-CM | POA: Diagnosis present

## 2015-06-12 DIAGNOSIS — M25569 Pain in unspecified knee: Secondary | ICD-10-CM | POA: Diagnosis not present

## 2015-06-12 DIAGNOSIS — M170 Bilateral primary osteoarthritis of knee: Secondary | ICD-10-CM | POA: Diagnosis not present

## 2015-06-12 DIAGNOSIS — E039 Hypothyroidism, unspecified: Secondary | ICD-10-CM | POA: Diagnosis present

## 2015-06-12 DIAGNOSIS — Z01812 Encounter for preprocedural laboratory examination: Secondary | ICD-10-CM

## 2015-06-12 DIAGNOSIS — E785 Hyperlipidemia, unspecified: Secondary | ICD-10-CM | POA: Diagnosis present

## 2015-06-12 DIAGNOSIS — Z87442 Personal history of urinary calculi: Secondary | ICD-10-CM | POA: Diagnosis not present

## 2015-06-12 DIAGNOSIS — M25561 Pain in right knee: Secondary | ICD-10-CM | POA: Diagnosis not present

## 2015-06-12 DIAGNOSIS — G8918 Other acute postprocedural pain: Secondary | ICD-10-CM | POA: Diagnosis not present

## 2015-06-12 DIAGNOSIS — L409 Psoriasis, unspecified: Secondary | ICD-10-CM | POA: Diagnosis present

## 2015-06-12 DIAGNOSIS — E119 Type 2 diabetes mellitus without complications: Secondary | ICD-10-CM | POA: Diagnosis present

## 2015-06-12 DIAGNOSIS — R262 Difficulty in walking, not elsewhere classified: Secondary | ICD-10-CM | POA: Diagnosis not present

## 2015-06-12 DIAGNOSIS — Z7982 Long term (current) use of aspirin: Secondary | ICD-10-CM

## 2015-06-12 DIAGNOSIS — M6281 Muscle weakness (generalized): Secondary | ICD-10-CM | POA: Diagnosis not present

## 2015-06-12 DIAGNOSIS — M1710 Unilateral primary osteoarthritis, unspecified knee: Secondary | ICD-10-CM | POA: Diagnosis present

## 2015-06-12 DIAGNOSIS — M25562 Pain in left knee: Secondary | ICD-10-CM | POA: Diagnosis not present

## 2015-06-12 DIAGNOSIS — M1712 Unilateral primary osteoarthritis, left knee: Secondary | ICD-10-CM | POA: Diagnosis not present

## 2015-06-12 DIAGNOSIS — Z96653 Presence of artificial knee joint, bilateral: Secondary | ICD-10-CM | POA: Diagnosis not present

## 2015-06-12 DIAGNOSIS — Z471 Aftercare following joint replacement surgery: Secondary | ICD-10-CM | POA: Diagnosis not present

## 2015-06-12 DIAGNOSIS — M1711 Unilateral primary osteoarthritis, right knee: Secondary | ICD-10-CM | POA: Diagnosis not present

## 2015-06-12 HISTORY — PX: TOTAL KNEE ARTHROPLASTY: SHX125

## 2015-06-12 LAB — TYPE AND SCREEN
ABO/RH(D): A POS
ANTIBODY SCREEN: NEGATIVE

## 2015-06-12 LAB — GLUCOSE, CAPILLARY: Glucose-Capillary: 128 mg/dL — ABNORMAL HIGH (ref 65–99)

## 2015-06-12 SURGERY — ARTHROPLASTY, KNEE, BILATERAL, TOTAL
Anesthesia: General | Site: Knee | Laterality: Bilateral

## 2015-06-12 MED ORDER — SODIUM CHLORIDE 0.9 % IV SOLN
INTRAVENOUS | Status: DC
  Administered 2015-06-12 – 2015-06-13 (×3): via INTRAVENOUS

## 2015-06-12 MED ORDER — PROPOFOL 10 MG/ML IV BOLUS
INTRAVENOUS | Status: AC
  Filled 2015-06-12: qty 20

## 2015-06-12 MED ORDER — MENTHOL 3 MG MT LOZG: 1.0000 | LOZENGE | OROMUCOSAL | Status: DC | PRN

## 2015-06-12 MED ORDER — MIDAZOLAM HCL 5 MG/5ML IJ SOLN
INTRAMUSCULAR | Status: DC | PRN
  Administered 2015-06-12: 1 mg via INTRAVENOUS

## 2015-06-12 MED ORDER — METOCLOPRAMIDE HCL 5 MG/ML IJ SOLN
INTRAMUSCULAR | Status: DC | PRN
  Administered 2015-06-12: 10 mg via INTRAVENOUS

## 2015-06-12 MED ORDER — ACETAMINOPHEN 500 MG PO TABS
1000.0000 mg | ORAL_TABLET | Freq: Four times a day (QID) | ORAL | Status: AC
  Administered 2015-06-12 – 2015-06-13 (×4): 1000 mg via ORAL
  Filled 2015-06-12 (×5): qty 2

## 2015-06-12 MED ORDER — GEMFIBROZIL 600 MG PO TABS
600.0000 mg | ORAL_TABLET | Freq: Every day | ORAL | Status: DC
  Administered 2015-06-12 – 2015-06-16 (×5): 600 mg via ORAL
  Filled 2015-06-12 (×8): qty 1

## 2015-06-12 MED ORDER — LACTATED RINGERS IV SOLN: INTRAVENOUS | Status: DC

## 2015-06-12 MED ORDER — LEVOTHYROXINE SODIUM 75 MCG PO TABS
75.0000 ug | ORAL_TABLET | Freq: Every day | ORAL | Status: DC
  Administered 2015-06-13 – 2015-06-16 (×4): 75 ug via ORAL
  Filled 2015-06-12 (×7): qty 1

## 2015-06-12 MED ORDER — ACETAMINOPHEN 325 MG PO TABS
650.0000 mg | ORAL_TABLET | Freq: Four times a day (QID) | ORAL | Status: DC | PRN
  Administered 2015-06-14 – 2015-06-16 (×4): 650 mg via ORAL
  Filled 2015-06-12 (×4): qty 2

## 2015-06-12 MED ORDER — LACTATED RINGERS IV SOLN
INTRAVENOUS | Status: DC | PRN
  Administered 2015-06-12 (×3): via INTRAVENOUS

## 2015-06-12 MED ORDER — PHENOL 1.4 % MT LIQD: 1.0000 | OROMUCOSAL | Status: DC | PRN

## 2015-06-12 MED ORDER — NEOSTIGMINE METHYLSULFATE 10 MG/10ML IV SOLN
INTRAVENOUS | Status: AC
  Filled 2015-06-12: qty 1

## 2015-06-12 MED ORDER — ACETAMINOPHEN 10 MG/ML IV SOLN
1000.0000 mg | Freq: Once | INTRAVENOUS | Status: AC
  Administered 2015-06-12: 1000 mg via INTRAVENOUS
  Filled 2015-06-12: qty 100

## 2015-06-12 MED ORDER — METOCLOPRAMIDE HCL 5 MG/ML IJ SOLN: 5.0000 mg | Freq: Three times a day (TID) | INTRAMUSCULAR | Status: DC | PRN

## 2015-06-12 MED ORDER — TRAMADOL HCL 50 MG PO TABS
50.0000 mg | ORAL_TABLET | Freq: Four times a day (QID) | ORAL | Status: DC | PRN
  Administered 2015-06-14 (×2): 100 mg via ORAL
  Filled 2015-06-12 (×2): qty 2

## 2015-06-12 MED ORDER — SODIUM CHLORIDE 0.9 % IV SOLN: INTRAVENOUS | Status: DC

## 2015-06-12 MED ORDER — DIPHENHYDRAMINE HCL 12.5 MG/5ML PO ELIX
12.5000 mg | ORAL_SOLUTION | ORAL | Status: DC | PRN
  Administered 2015-06-14 – 2015-06-16 (×3): 12.5 mg via ORAL
  Filled 2015-06-12: qty 10
  Filled 2015-06-12 (×2): qty 5

## 2015-06-12 MED ORDER — ETOMIDATE 2 MG/ML IV SOLN
INTRAVENOUS | Status: AC
  Filled 2015-06-12: qty 10

## 2015-06-12 MED ORDER — METOCLOPRAMIDE HCL 10 MG PO TABS: 5.0000 mg | ORAL_TABLET | Freq: Three times a day (TID) | ORAL | Status: DC | PRN

## 2015-06-12 MED ORDER — DEXAMETHASONE SODIUM PHOSPHATE 10 MG/ML IJ SOLN
INTRAMUSCULAR | Status: AC
  Filled 2015-06-12: qty 1

## 2015-06-12 MED ORDER — BISACODYL 10 MG RE SUPP: 10.0000 mg | Freq: Every day | RECTAL | Status: DC | PRN

## 2015-06-12 MED ORDER — TRANEXAMIC ACID 1000 MG/10ML IV SOLN
2000.0000 mg | Freq: Once | INTRAVENOUS | Status: DC
  Filled 2015-06-12: qty 20

## 2015-06-12 MED ORDER — WARFARIN SODIUM 2.5 MG PO TABS
2.5000 mg | ORAL_TABLET | Freq: Once | ORAL | Status: AC
  Administered 2015-06-13: 2.5 mg via ORAL
  Filled 2015-06-12: qty 1

## 2015-06-12 MED ORDER — ONDANSETRON HCL 4 MG PO TABS: 4.0000 mg | ORAL_TABLET | Freq: Four times a day (QID) | ORAL | Status: DC | PRN

## 2015-06-12 MED ORDER — SODIUM CHLORIDE 0.9 % IJ SOLN
INTRAMUSCULAR | Status: AC
  Filled 2015-06-12: qty 10

## 2015-06-12 MED ORDER — METHOCARBAMOL 1000 MG/10ML IJ SOLN
500.0000 mg | Freq: Four times a day (QID) | INTRAVENOUS | Status: DC | PRN
  Administered 2015-06-12 – 2015-06-13 (×2): 500 mg via INTRAVENOUS
  Filled 2015-06-12 (×4): qty 5

## 2015-06-12 MED ORDER — METOCLOPRAMIDE HCL 5 MG/ML IJ SOLN
INTRAMUSCULAR | Status: AC
  Filled 2015-06-12: qty 2

## 2015-06-12 MED ORDER — DEXAMETHASONE SODIUM PHOSPHATE 10 MG/ML IJ SOLN: 10.0000 mg | Freq: Once | INTRAMUSCULAR | Status: DC

## 2015-06-12 MED ORDER — METHOCARBAMOL 500 MG PO TABS
500.0000 mg | ORAL_TABLET | Freq: Four times a day (QID) | ORAL | Status: DC | PRN
  Administered 2015-06-12 – 2015-06-17 (×9): 500 mg via ORAL
  Filled 2015-06-12 (×9): qty 1

## 2015-06-12 MED ORDER — COUMADIN BOOK
Freq: Once | Status: AC
  Administered 2015-06-12: 16:00:00
  Filled 2015-06-12: qty 1

## 2015-06-12 MED ORDER — ACETAMINOPHEN 650 MG RE SUPP: 650.0000 mg | Freq: Four times a day (QID) | RECTAL | Status: DC | PRN

## 2015-06-12 MED ORDER — CEFAZOLIN SODIUM-DEXTROSE 2-3 GM-% IV SOLR
INTRAVENOUS | Status: AC
  Filled 2015-06-12: qty 50

## 2015-06-12 MED ORDER — HYDROMORPHONE HCL 2 MG PO TABS
2.0000 mg | ORAL_TABLET | ORAL | Status: DC | PRN
  Administered 2015-06-12 – 2015-06-14 (×6): 2 mg via ORAL
  Administered 2015-06-14 (×2): 6 mg via ORAL
  Administered 2015-06-14: 2 mg via ORAL
  Administered 2015-06-14: 4 mg via ORAL
  Administered 2015-06-14: 6 mg via ORAL
  Administered 2015-06-15 (×2): 4 mg via ORAL
  Administered 2015-06-15: 6 mg via ORAL
  Administered 2015-06-15: 4 mg via ORAL
  Administered 2015-06-16 (×2): 2 mg via ORAL
  Administered 2015-06-16: 4 mg via ORAL
  Administered 2015-06-16 – 2015-06-17 (×3): 2 mg via ORAL
  Filled 2015-06-12 (×2): qty 1
  Filled 2015-06-12: qty 3
  Filled 2015-06-12: qty 2
  Filled 2015-06-12 (×3): qty 1
  Filled 2015-06-12: qty 3
  Filled 2015-06-12 (×2): qty 1
  Filled 2015-06-12: qty 2
  Filled 2015-06-12 (×2): qty 3
  Filled 2015-06-12: qty 1
  Filled 2015-06-12 (×2): qty 2
  Filled 2015-06-12 (×4): qty 1
  Filled 2015-06-12: qty 2
  Filled 2015-06-12: qty 1

## 2015-06-12 MED ORDER — HYDROMORPHONE HCL 1 MG/ML IJ SOLN
0.5000 mg | INTRAMUSCULAR | Status: DC | PRN
  Administered 2015-06-14: 1 mg via INTRAVENOUS
  Filled 2015-06-12: qty 1

## 2015-06-12 MED ORDER — HYDROMORPHONE HCL 1 MG/ML IJ SOLN: 0.2500 mg | INTRAMUSCULAR | Status: DC | PRN

## 2015-06-12 MED ORDER — WARFARIN - PHARMACIST DOSING INPATIENT: Freq: Every day | Status: DC

## 2015-06-12 MED ORDER — CEFAZOLIN SODIUM-DEXTROSE 2-3 GM-% IV SOLR
2.0000 g | INTRAVENOUS | Status: AC
  Administered 2015-06-12: 2 g via INTRAVENOUS

## 2015-06-12 MED ORDER — ONDANSETRON HCL 4 MG/2ML IJ SOLN
INTRAMUSCULAR | Status: AC
  Filled 2015-06-12: qty 2

## 2015-06-12 MED ORDER — BUPIVACAINE HCL (PF) 0.25 % IJ SOLN
INTRAMUSCULAR | Status: AC
  Filled 2015-06-12: qty 30

## 2015-06-12 MED ORDER — NITROGLYCERIN 0.4 MG SL SUBL: 0.4000 mg | SUBLINGUAL_TABLET | SUBLINGUAL | Status: DC | PRN

## 2015-06-12 MED ORDER — WARFARIN VIDEO
Freq: Once | Status: AC
  Administered 2015-06-12: 16:00:00

## 2015-06-12 MED ORDER — ROPIVACAINE HCL 2 MG/ML IJ SOLN
10.0000 mL/h | INTRAMUSCULAR | Status: DC
  Administered 2015-06-12 – 2015-06-13 (×3): 10 mL/h via EPIDURAL
  Filled 2015-06-12 (×8): qty 200

## 2015-06-12 MED ORDER — PROPOFOL 10 MG/ML IV BOLUS
INTRAVENOUS | Status: DC | PRN
  Administered 2015-06-12: 100 mg via INTRAVENOUS

## 2015-06-12 MED ORDER — MIDAZOLAM HCL 2 MG/2ML IJ SOLN
INTRAMUSCULAR | Status: AC
  Filled 2015-06-12: qty 2

## 2015-06-12 MED ORDER — ONDANSETRON HCL 4 MG/2ML IJ SOLN
INTRAMUSCULAR | Status: DC | PRN
  Administered 2015-06-12: 4 mg via INTRAVENOUS

## 2015-06-12 MED ORDER — BUPIVACAINE HCL (PF) 0.25 % IJ SOLN
INTRAMUSCULAR | Status: DC | PRN
  Administered 2015-06-12: 10 mL
  Administered 2015-06-12: 5 mL

## 2015-06-12 MED ORDER — FLEET ENEMA 7-19 GM/118ML RE ENEM: 1.0000 | ENEMA | Freq: Once | RECTAL | Status: AC | PRN

## 2015-06-12 MED ORDER — HYDROMORPHONE HCL 2 MG/ML IJ SOLN
INTRAMUSCULAR | Status: AC
  Filled 2015-06-12: qty 1

## 2015-06-12 MED ORDER — SUFENTANIL CITRATE 50 MCG/ML IV SOLN
INTRAVENOUS | Status: DC | PRN
  Administered 2015-06-12 (×3): 10 ug via INTRAVENOUS

## 2015-06-12 MED ORDER — CHLORHEXIDINE GLUCONATE 4 % EX LIQD: 60.0000 mL | Freq: Once | CUTANEOUS | Status: DC

## 2015-06-12 MED ORDER — ACETAMINOPHEN 10 MG/ML IV SOLN: 1000.0000 mg | Freq: Once | INTRAVENOUS | Status: DC

## 2015-06-12 MED ORDER — CEFAZOLIN SODIUM-DEXTROSE 2-3 GM-% IV SOLR
2.0000 g | Freq: Four times a day (QID) | INTRAVENOUS | Status: AC
  Administered 2015-06-12 (×2): 2 g via INTRAVENOUS
  Filled 2015-06-12 (×2): qty 50

## 2015-06-12 MED ORDER — LIDOCAINE-EPINEPHRINE (PF) 2 %-1:200000 IJ SOLN
INTRAMUSCULAR | Status: DC | PRN
  Administered 2015-06-12: 5 mL

## 2015-06-12 MED ORDER — ONDANSETRON HCL 4 MG/2ML IJ SOLN
4.0000 mg | Freq: Four times a day (QID) | INTRAMUSCULAR | Status: DC | PRN
  Administered 2015-06-14: 4 mg via INTRAVENOUS
  Filled 2015-06-12: qty 2

## 2015-06-12 MED ORDER — SUFENTANIL CITRATE 50 MCG/ML IV SOLN
INTRAVENOUS | Status: AC
  Filled 2015-06-12: qty 1

## 2015-06-12 MED ORDER — POLYETHYLENE GLYCOL 3350 17 G PO PACK
17.0000 g | PACK | Freq: Every day | ORAL | Status: DC | PRN
  Administered 2015-06-13: 17 g via ORAL
  Filled 2015-06-12: qty 1

## 2015-06-12 MED ORDER — ACETAMINOPHEN 10 MG/ML IV SOLN
INTRAVENOUS | Status: AC
  Filled 2015-06-12: qty 100

## 2015-06-12 MED ORDER — DEXAMETHASONE SODIUM PHOSPHATE 10 MG/ML IJ SOLN
10.0000 mg | Freq: Once | INTRAMUSCULAR | Status: AC
  Administered 2015-06-13: 10 mg via INTRAVENOUS
  Filled 2015-06-12: qty 1

## 2015-06-12 MED ORDER — DEXAMETHASONE SODIUM PHOSPHATE 10 MG/ML IJ SOLN
10.0000 mg | Freq: Once | INTRAMUSCULAR | Status: AC
  Administered 2015-06-12: 10 mg via INTRAVENOUS

## 2015-06-12 MED ORDER — DOCUSATE SODIUM 100 MG PO CAPS
100.0000 mg | ORAL_CAPSULE | Freq: Two times a day (BID) | ORAL | Status: DC
  Administered 2015-06-12 – 2015-06-17 (×10): 100 mg via ORAL
  Filled 2015-06-12 (×7): qty 1

## 2015-06-12 MED ORDER — ETOMIDATE 2 MG/ML IV SOLN
INTRAVENOUS | Status: DC | PRN
  Administered 2015-06-12: 10 mg via INTRAVENOUS

## 2015-06-12 MED ORDER — SUCCINYLCHOLINE CHLORIDE 20 MG/ML IJ SOLN
INTRAMUSCULAR | Status: DC | PRN
  Administered 2015-06-12: 100 mg via INTRAVENOUS

## 2015-06-12 MED ORDER — TRANEXAMIC ACID 1000 MG/10ML IV SOLN
30.0000 mg | INTRAVENOUS | Status: DC | PRN
  Administered 2015-06-12 (×2): 30 mg via TOPICAL

## 2015-06-12 SURGICAL SUPPLY — 61 items
BAG ZIPLOCK 12X15 (MISCELLANEOUS) ×6 IMPLANT
BANDAGE ELASTIC 6 VELCRO ST LF (GAUZE/BANDAGES/DRESSINGS) ×6 IMPLANT
BANDAGE ESMARK 6X9 LF (GAUZE/BANDAGES/DRESSINGS) ×2 IMPLANT
BLADE SAG 18X100X1.27 (BLADE) ×6 IMPLANT
BLADE SAW SGTL 11.0X1.19X90.0M (BLADE) ×6 IMPLANT
BLADE SURG SZ10 CARB STEEL (BLADE) ×12 IMPLANT
BNDG COHESIVE 6X5 TAN STRL LF (GAUZE/BANDAGES/DRESSINGS) ×6 IMPLANT
BNDG ESMARK 6X9 LF (GAUZE/BANDAGES/DRESSINGS) ×6
BOWL SMART MIX CTS (DISPOSABLE) ×6 IMPLANT
CAPT KNEE TOTAL 3 ATTUNE ×6 IMPLANT
CEMENT HV SMART SET (Cement) ×12 IMPLANT
CLOSURE WOUND 1/2 X4 (GAUZE/BANDAGES/DRESSINGS) ×1
CUFF TOURN SGL QUICK 34 (TOURNIQUET CUFF) ×4
CUFF TRNQT CYL 34X4X40X1 (TOURNIQUET CUFF) ×2 IMPLANT
DRAPE EXTREMITY BILATERAL (DRAPE) ×3 IMPLANT
DRAPE INCISE IOBAN 66X45 STRL (DRAPES) ×6 IMPLANT
DRAPE POUCH INSTRU U-SHP 10X18 (DRAPES) ×3 IMPLANT
DRAPE U-SHAPE 47X51 STRL (DRAPES) ×9 IMPLANT
DRESSING ALLEVYN LIFE SACRUM (GAUZE/BANDAGES/DRESSINGS) ×3 IMPLANT
DRSG ADAPTIC 3X8 NADH LF (GAUZE/BANDAGES/DRESSINGS) ×6 IMPLANT
DRSG PAD ABDOMINAL 8X10 ST (GAUZE/BANDAGES/DRESSINGS) ×6 IMPLANT
DURAPREP 26ML APPLICATOR (WOUND CARE) ×6 IMPLANT
ELECT REM PT RETURN 9FT ADLT (ELECTROSURGICAL) ×3
ELECTRODE REM PT RTRN 9FT ADLT (ELECTROSURGICAL) ×1 IMPLANT
EVACUATOR 1/8 PVC DRAIN (DRAIN) ×6 IMPLANT
FACESHIELD WRAPAROUND (MASK) ×24 IMPLANT
GAUZE SPONGE 4X4 12PLY STRL (GAUZE/BANDAGES/DRESSINGS) ×6 IMPLANT
GLOVE BIO SURGEON STRL SZ7.5 (GLOVE) IMPLANT
GLOVE BIO SURGEON STRL SZ8 (GLOVE) ×6 IMPLANT
GLOVE BIOGEL PI IND STRL 6.5 (GLOVE) ×2 IMPLANT
GLOVE BIOGEL PI IND STRL 8 (GLOVE) ×2 IMPLANT
GLOVE BIOGEL PI INDICATOR 6.5 (GLOVE) ×4
GLOVE BIOGEL PI INDICATOR 8 (GLOVE) ×4
GLOVE SURG SS PI 6.5 STRL IVOR (GLOVE) ×6 IMPLANT
GOWN STRL REUS W/TWL LRG LVL3 (GOWN DISPOSABLE) ×3 IMPLANT
GOWN STRL REUS W/TWL XL LVL3 (GOWN DISPOSABLE) ×3 IMPLANT
HANDPIECE INTERPULSE COAX TIP (DISPOSABLE) ×2
IMMOBILIZER KNEE 20 (SOFTGOODS) ×6
IMMOBILIZER KNEE 20 THIGH 36 (SOFTGOODS) ×2 IMPLANT
KIT BASIN OR (CUSTOM PROCEDURE TRAY) ×3 IMPLANT
MANIFOLD NEPTUNE II (INSTRUMENTS) ×3 IMPLANT
NDL SAFETY ECLIPSE 18X1.5 (NEEDLE) ×2 IMPLANT
NEEDLE HYPO 18GX1.5 SHARP (NEEDLE) ×4
NS IRRIG 1000ML POUR BTL (IV SOLUTION) ×3 IMPLANT
PACK TOTAL JOINT (CUSTOM PROCEDURE TRAY) ×3 IMPLANT
PADDING CAST COTTON 6X4 STRL (CAST SUPPLIES) ×6 IMPLANT
SET HNDPC FAN SPRY TIP SCT (DISPOSABLE) ×1 IMPLANT
SPONGE LAP 18X18 X RAY DECT (DISPOSABLE) ×6 IMPLANT
STOCKINETTE 8 INCH (MISCELLANEOUS) ×3 IMPLANT
STRIP CLOSURE SKIN 1/2X4 (GAUZE/BANDAGES/DRESSINGS) ×2 IMPLANT
SUCTION FRAZIER 12FR DISP (SUCTIONS) ×3 IMPLANT
SUT MNCRL AB 4-0 PS2 18 (SUTURE) ×6 IMPLANT
SUT VIC AB 2-0 CT1 27 (SUTURE) ×12
SUT VIC AB 2-0 CT1 TAPERPNT 27 (SUTURE) ×6 IMPLANT
SUT VLOC 180 0 24IN GS25 (SUTURE) ×6 IMPLANT
SYR 20CC LL (SYRINGE) ×6 IMPLANT
SYR 50ML LL SCALE MARK (SYRINGE) IMPLANT
TOWEL OR 17X26 10 PK STRL BLUE (TOWEL DISPOSABLE) ×6 IMPLANT
TRAY FOLEY W/METER SILVER 14FR (SET/KITS/TRAYS/PACK) ×3 IMPLANT
WATER STERILE IRR 1500ML POUR (IV SOLUTION) ×3 IMPLANT
WRAP KNEE MAXI GEL POST OP (GAUZE/BANDAGES/DRESSINGS) ×6 IMPLANT

## 2015-06-12 NOTE — Anesthesia Procedure Notes (Addendum)
Epidural Patient location during procedure: holding area Start time: 06/12/2015 8:55 AM End time: 06/12/2015 9:05 AM  Staffing Anesthesiologist: Rod Mae Performed by: anesthesiologist   Preanesthetic Checklist Completed: patient identified, site marked, surgical consent, pre-op evaluation, timeout performed, IV checked, risks and benefits discussed, monitors and equipment checked and post-op pain management  Epidural Patient position: sitting Prep: Betadine Patient monitoring: heart rate, continuous pulse ox and blood pressure Approach: midline Location: L2-L3 Injection technique: LOR air  Needle:  Needle type: Hustead  Needle gauge: 18 G Needle length: 9 cm and 9 Catheter type: closed end flexible Catheter size: 20 Guage Test dose: negative and 1.5% lidocaine  Assessment Sensory level: T6  Additional Notes Test dose 1.5% Lidocaine with epi 1:200,000  Patient tolerated the insertion well without complications.Reason for block:post-op pain management  Procedure Name: Intubation Date/Time: 06/12/2015 9:27 AM Performed by: Johnathan Hausen A Pre-anesthesia Checklist: Patient identified, Timeout performed, Emergency Drugs available, Suction available and Patient being monitored Patient Re-evaluated:Patient Re-evaluated prior to inductionOxygen Delivery Method: Circle system utilized Preoxygenation: Pre-oxygenation with 100% oxygen Intubation Type: IV induction Ventilation: Mask ventilation without difficulty Laryngoscope Size: Mac and 4 Grade View: Grade I Tube type: Oral Tube size: 7.5 mm Number of attempts: 1 Airway Equipment and Method: Stylet Placement Confirmation: ETT inserted through vocal cords under direct vision,  positive ETCO2 and breath sounds checked- equal and bilateral Secured at: 20 cm Tube secured with: Tape Dental Injury: Teeth and Oropharynx as per pre-operative assessment

## 2015-06-12 NOTE — Addendum Note (Signed)
Addendum  created 06/12/15 1335 by Bailey Mech, CRNA   Modules edited: Charges VN

## 2015-06-12 NOTE — Interval H&P Note (Signed)
History and Physical Interval Note:  06/12/2015 8:54 AM  Renee [name]  has presented today for surgery, with the diagnosis of osteoarthritis of bilateral knees  The various methods of treatment have been discussed with the patient and family. After consideration of risks, benefits and other options for treatment, the patient has consented to  Procedure(s): TOTAL KNEE BILATERAL (Bilateral) as a surgical intervention .  The patient's history has been reviewed, patient examined, no change in status, stable for surgery.  I have reviewed the patient's chart and labs.  Questions were answered to the patient's satisfaction.     Gearlean Alf

## 2015-06-12 NOTE — Progress Notes (Signed)
ANTICOAGULATION CONSULT NOTE - Initial Consult  Pharmacy Consult for Warfarin Indication: VTE prophylaxis  Allergies  Allergen Reactions  . Other Rash    Metal   . Codeine Nausea And Vomiting  . Metformin Nausea And Vomiting  . Simvastatin Swelling and Rash   Patient Measurements: Height: 5\' 4"  (162.6 cm) Weight: 156 lb (70.761 kg) IBW/kg (Calculated) : 54.7  Vital Signs: Temp: 97.6 F (36.4 C) (06/16 1303) Temp Source: Oral (06/16 0732) BP: 171/70 mmHg (06/16 1303) Pulse Rate: 76 (06/16 1303)  Labs: No results for input(s): HGB, HCT, PLT, APTT, LABPROT, INR, HEPARINUNFRC, CREATININE, CKTOTAL, CKMB, TROPONINI in the last 72 hours.  Estimated Creatinine Clearance: 63.1 mL/min (by C-G formula based on Cr of 0.66).  Medical History: Past Medical History  Diagnosis Date  . Hyperlipidemia   . Hypothyroidism   . CAD (coronary artery disease)     non-obstructive. cath 11/11  . Hypertension   . History of phobia     clastrophobia  . Diabetes mellitus     borderline diabetic - diet controlled   . Kidney stones     hx of   . Urinary tract infection     hx of   . Arthritis   . Psoriasis    Medications:  Scheduled:  . acetaminophen  1,000 mg Oral 4 times per day  .  ceFAZolin (ANCEF) IV  2 g Intravenous Q6H  . [START ON 06/13/2015] dexamethasone  10 mg Intravenous Once  . docusate sodium  100 mg Oral BID  . gemfibrozil  600 mg Oral QAC breakfast  . [START ON 06/13/2015] levothyroxine  75 mcg Oral QAC breakfast    Assessment: 1 yoF s/p bilateral TKA. Warfarin ordered for VTE prophylaxis, Pharmacy to dose. Not on any anti-coagulant PTA.  Baseline INR on 6/10 = 0.97, no anti-coagulants prior to admit  Current Ropivacaine epidural anesthesia,  First dose Warfarin to begin 6/17 @ 10am  Goal of Therapy:  INR 2-3   Plan:   Tomorrow at 10am: Warfarin 2.5mg  x1  Daily PT/INR ordered  Minda Ditto PharmD Pager [phone] 06/12/2015, 1:43 PM

## 2015-06-12 NOTE — Anesthesia Postprocedure Evaluation (Signed)
  Anesthesia Post-op Note  [name]  Procedure(s) Performed: Procedure(s) (LRB): TOTAL KNEE BILATERAL (Bilateral)  Patient Location: PACU  Anesthesia Type: GA combined with regional for post-op pain  Level of Consciousness: awake and alert   Airway and Oxygen Therapy: Patient Spontanous Breathing  Post-op Pain: mild  Post-op Assessment: Post-op Vital signs reviewed, Patient's Cardiovascular Status Stable, Respiratory Function Stable, Patent Airway and No signs of Nausea or vomiting  Last Vitals:  Filed Vitals:   06/12/15 1303  BP: 171/70  Pulse: 76  Temp: 36.4 C  Resp: 16    Post-op Vital Signs: stable   Complications: No apparent anesthesia complications

## 2015-06-12 NOTE — Plan of Care (Signed)
Problem: Consults Goal: Diagnosis- Total Joint Replacement Primary Total Knee     

## 2015-06-12 NOTE — Anesthesia Preprocedure Evaluation (Signed)
Anesthesia Evaluation  Patient identified by MRN, date of birth, ID band Patient awake    Reviewed: Allergy & Precautions, H&P , NPO status , Patient's Chart, lab work & pertinent test results  Airway Mallampati: II  TM Distance: >3 FB Neck ROM: full    Dental no notable dental hx.    Pulmonary neg pulmonary ROS,  breath sounds clear to auscultation  Pulmonary exam normal       Cardiovascular Exercise Tolerance: Good hypertension, Pt. on medications + CAD Normal cardiovascular examRhythm:regular Rate:Normal     Neuro/Psych claustrophobianegative neurological ROS  negative psych ROS   GI/Hepatic negative GI ROS, Neg liver ROS,   Endo/Other  diabetes, Well Controlled, Type 2Hypothyroidism Diet controlled borderline diabetic  Renal/GU negative Renal ROS  negative genitourinary   Musculoskeletal   Abdominal   Peds  Hematology negative hematology ROS (+)   Anesthesia Other Findings   Reproductive/Obstetrics negative OB ROS                             Anesthesia Physical Anesthesia Plan  ASA: II  Anesthesia Plan: General   Post-op Pain Management:    Induction: Intravenous  Airway Management Planned: Oral ETT  Additional Equipment:   Intra-op Plan:   Post-operative Plan: Extubation in OR  Informed Consent: I have reviewed the patients History and Physical, chart, labs and discussed the procedure including the risks, benefits and alternatives for the proposed anesthesia with the patient or authorized representative who has indicated his/her understanding and acceptance.   Dental Advisory Given  Plan Discussed with: CRNA and Surgeon  Anesthesia Plan Comments:         Anesthesia Quick Evaluation

## 2015-06-12 NOTE — Op Note (Signed)
Pre-operative diagnosis- Osteoarthritis  Bilateral knee(s)  Post-operative diagnosis- Osteoarthritis Bilateral knee(s)  Procedure-  Bilateral  Total Knee Arthroplasty  Surgeon- Dione Plover. Evaline Waltman, MD  Assistant- Ardeen Jourdain, PA-C   Anesthesia-  General and Epidural  EBL-* No blood loss amount entered *   Drains Hemovac x 1 each side  Tourniquet time-  Total Tourniquet Time Documented: Thigh (Left) - 35 minutes Thigh (Left) - 30 minutes Total: Thigh (Left) - 65 minutes     Complications- None  Condition-PACU - hemodynamically stable.   Brief Clinical Note  Renee [name] is a 69 y.o. year old female with end stage OA of both knees with progressively worsening pain and dysfunction. She has constant pain, with activity and at rest and significant functional deficits with difficulties even with ADLs. She has had extensive non-op management including analgesics, injections of cortisone, and home exercise program, but remains in significant pain with significant dysfunction. We discussed replacing both knees in the same setting versus one at a time including procedure, risks, potential complications, rehab course, and pros and cons associated with each and the patient elects to do both knees at the same time. She presents now for bilateral Total Knee Arthroplasty.     Procedure in detail---   The patient is brought into the operating room and positioned supine on the operating table. After successful administration of  General and Epidural,   a tourniquet is placed high on the  Bilateral thigh(s) and the lower extremities are prepped and draped in the usual sterile fashion. Time out is performed by the operating team and then the  Right lower extremity is wrapped in Esmarch, knee flexed and the tourniquet inflated to 300 mmHg.       A midline incision is made with a ten blade through the subcutaneous tissue to the level of the extensor mechanism. A fresh blade is used to make a medial  parapatellar arthrotomy. Soft tissue over the proximal medial tibia is subperiosteally elevated to the joint line with a knife and into the semimembranosus bursa with a Cobb elevator. Soft tissue over the proximal lateral tibia is elevated with attention being paid to avoiding the patellar tendon on the tibial tubercle. The patella is everted, knee flexed 90 degrees and the ACL and PCL are removed. Findings are bone on bone lateral and patellofemoral with large global osteophytes.        The drill is used to create a starting hole in the distal femur and the canal is thoroughly irrigated with sterile saline to remove the fatty contents. The 5 degree Right  valgus alignment guide is placed into the femoral canal and the distal femoral cutting block is pinned to remove 9 mm off the distal femur. Resection is made with an oscillating saw.      The tibia is subluxed forward and the menisci are removed. The extramedullary alignment guide is placed referencing proximally at the medial aspect of the tibial tubercle and distally along the second metatarsal axis and tibial crest. The block is pinned to remove 33mm off the more deficient lateral  side. Resection is made with an oscillating saw. Size 4is the most appropriate size for the tibia and the proximal tibia is prepared with the modular drill and keel punch for that size.      The femoral sizing guide is placed and size 5 is most appropriate. Rotation is marked off the epicondylar axis and confirmed by creating a rectangular flexion gap at 90 degrees. The size  5 cutting block is pinned in this rotation and the anterior, posterior and chamfer cuts are made with the oscillating saw. The intercondylar block is then placed and that cut is made.      Trial size 4 tibial component, trial size 5 posterior stabilized femur and a 6  mm posterior stabilized rotating platform insert trial is placed. Full extension is achieved with excellent varus/valgus and anterior/posterior  balance throughout full range of motion. The patella is everted and thickness measured to be 22  mm. Free hand resection is taken to 12 mm, a 35 template is placed, lug holes are drilled, trial patella is placed, and it tracks normally. Osteophytes are removed off the posterior femur with the trial in place. All trials are removed and the cut bone surfaces prepared with pulsatile lavage. Cement is mixed and once ready for implantation, the size 4 tibial implant, size  5 narrow posterior stabilized femoral component, and the size 35 patella are cemented in place and the patella is held with the clamp. The trial insert is placed and the knee held in full extension.  All extruded cement is removed and once the cement is hard the permanent 6 mm posterior stabilized rotating platform insert is placed into the tibial tray.      The wound is copiously irrigated with saline solution and the extensor mechanism closed over a hemovac drain with #1 V-loc suture. The tourniquet is released for a total tourniquet time of 34  minutes. Flexion against gravity is 140 degrees and the patella tracks normally. Subcutaneous tissue is closed with 2.0 vicryl and subcuticular with running 4.0 Monocryl.       The  Left lower extremity is wrapped in Esmarch, knee flexed and the tourniquet inflated to 300 mmHg.       A midline incision is made with a ten blade through the subcutaneous tissue to the level of the extensor mechanism. A fresh blade is used to make a medial parapatellar arthrotomy. Soft tissue over the proximal medial tibia is subperiosteally elevated to the joint line with a knife and into the semimembranosus bursa with a Cobb elevator. Soft tissue over the proximal lateral tibia is elevated with attention being paid to avoiding the patellar tendon on the tibial tubercle. The patella is everted, knee flexed 90 degrees and the ACL and PCL are removed. Findings are bone on bone all 3 compartments with large global osteophytes.         The drill is used to create a starting hole in the distal femur and the canal is thoroughly irrigated with sterile saline to remove the fatty contents. The 5 degree Left  valgus alignment guide is placed into the femoral canal and the distal femoral cutting block is pinned to remove 9 mm off the distal femur. Resection is made with an oscillating saw.      The tibia is subluxed forward and the menisci are removed. The extramedullary alignment guide is placed referencing proximally at the medial aspect of the tibial tubercle and distally along the second metatarsal axis and tibial crest. The block is pinned to remove 42mm off the more deficient lateral  side. Resection is made with an oscillating saw. Size 4is the most appropriate size for the tibia and the proximal tibia is prepared with the modular drill and keel punch for that size.      The femoral sizing guide is placed and size 5 is most appropriate. Rotation is marked off the epicondylar axis and confirmed  by creating a rectangular flexion gap at 90 degrees. The size 5 cutting block is pinned in this rotation and the anterior, posterior and chamfer cuts are made with the oscillating saw. The intercondylar block is then placed and that cut is made.      Trial size 4 tibial component, trial size 5 posterior stabilized femur and a 6  mm posterior stabilized rotating platform insert trial is placed. Full extension is achieved with excellent varus/valgus and anterior/posterior balance throughout full range of motion. The patella is everted and thickness measured to be 22  mm. Free hand resection is taken to 12 mm, a 35 template is placed, lug holes are drilled, trial patella is placed, and it tracks normally. Osteophytes are removed off the posterior femur with the trial in place. All trials are removed and the cut bone surfaces prepared with pulsatile lavage. Cement is mixed and once ready for implantation, the size 4 tibial implant, size  5 narrow  posterior stabilized femoral component, and the size 35 patella are cemented in place and the patella is held with the clamp. The trial insert is placed and the knee held in full extension.   All extruded cement is removed and once the cement is hard the permanent 6 mm posterior stabilized rotating platform insert is placed into the tibial tray.      The wound is copiously irrigated with saline solution and the extensor mechanism closed over a hemovac drain with #1 V-loc suture. The tourniquet is released for a total tourniquet time of 30  minutes. Flexion against gravity is 140 degrees and the patella tracks normally. Subcutaneous tissue is closed with 2.0 vicryl and subcuticular with running 4.0 Monocryl. The incisions are cleaned and dried and steri-strips and  bulky sterile dressings are applied. The limbs are placed into knee immobilizers and the patient is awakened and transported to recovery in stable condition.            Please note that a surgical assistant was a medical necessity for this procedure in order to perform it in a safe and expeditious manner. Surgical assistant was necessary to retract the ligaments and vital neurovascular structures to prevent injury to them and also necessary for proper positioning of the limb to allow for anatomic placement of the prosthesis.   Dione Plover Gilbert Narain, MD    06/12/2015, 11:17 AM

## 2015-06-12 NOTE — Transfer of Care (Signed)
Immediate Anesthesia Transfer of Care Note  [name]  Procedure(s) Performed: Procedure(s) with comments: TOTAL KNEE BILATERAL (Bilateral) - and epidural  Patient Location: PACU  Anesthesia Type:General  Level of Consciousness: awake, sedated and patient cooperative  Airway & Oxygen Therapy: Patient Spontanous Breathing and Patient connected to face mask oxygen  Post-op Assessment: Report given to RN and Post -op Vital signs reviewed and stable  Post vital signs: Reviewed and stable  Last Vitals:  Filed Vitals:   06/12/15 0732  BP: 142/90  Pulse: 77  Temp: 36.6 C  Resp: 16    Complications: No apparent anesthesia complications

## 2015-06-13 ENCOUNTER — Encounter (HOSPITAL_COMMUNITY): Payer: Self-pay | Admitting: Orthopedic Surgery

## 2015-06-13 ENCOUNTER — Encounter (HOSPITAL_COMMUNITY): Payer: Self-pay | Admitting: Anesthesiology

## 2015-06-13 DIAGNOSIS — Z96653 Presence of artificial knee joint, bilateral: Secondary | ICD-10-CM

## 2015-06-13 DIAGNOSIS — M170 Bilateral primary osteoarthritis of knee: Principal | ICD-10-CM

## 2015-06-13 LAB — CBC
HCT: 28.7 % — ABNORMAL LOW (ref 36.0–46.0)
Hemoglobin: 9.2 g/dL — ABNORMAL LOW (ref 12.0–15.0)
MCH: 29.4 pg (ref 26.0–34.0)
MCHC: 32.1 g/dL (ref 30.0–36.0)
MCV: 91.7 fL (ref 78.0–100.0)
Platelets: 293 10*3/uL (ref 150–400)
RBC: 3.13 MIL/uL — AB (ref 3.87–5.11)
RDW: 12.6 % (ref 11.5–15.5)
WBC: 12.8 10*3/uL — ABNORMAL HIGH (ref 4.0–10.5)

## 2015-06-13 LAB — BASIC METABOLIC PANEL
Anion gap: 7 (ref 5–15)
BUN: 19 mg/dL (ref 6–20)
CALCIUM: 8.8 mg/dL — AB (ref 8.9–10.3)
CO2: 24 mmol/L (ref 22–32)
CREATININE: 0.72 mg/dL (ref 0.44–1.00)
Chloride: 108 mmol/L (ref 101–111)
GFR calc non Af Amer: >60 mL/min (ref >60)
GLUCOSE: 175 mg/dL — AB (ref 65–99)
POTASSIUM: 3.8 mmol/L (ref 3.5–5.1)
Sodium: 139 mmol/L (ref 135–145)

## 2015-06-13 LAB — PROTIME-INR
INR: 1.07 (ref 0.00–1.49)
Prothrombin Time: 14.1 seconds (ref 11.6–15.2)

## 2015-06-13 MED ORDER — ENOXAPARIN SODIUM 30 MG/0.3ML ~~LOC~~ SOLN: 30.0000 mg | Freq: Two times a day (BID) | SUBCUTANEOUS | Status: DC

## 2015-06-13 MED ORDER — ALPRAZOLAM 1 MG PO TABS
1.0000 mg | ORAL_TABLET | Freq: Every day | ORAL | Status: DC | PRN
  Filled 2015-06-13: qty 1

## 2015-06-13 MED ORDER — LISINOPRIL 20 MG PO TABS
20.0000 mg | ORAL_TABLET | Freq: Every day | ORAL | Status: DC
  Administered 2015-06-13 – 2015-06-16 (×4): 20 mg via ORAL
  Filled 2015-06-13 (×4): qty 1

## 2015-06-13 NOTE — Progress Notes (Signed)
[id number] [place]/meets inpatient criteria but no beds available until after Monday [id number]/informed patient at rehab will need to be done in a snf/patient verbally understands/tct-Jamie CSW-Suzzanna(on call)/notified of need to fax patient out to local snf for inpatient rehab.  Floor RN Amy alerting Aulisio md of situation.

## 2015-06-13 NOTE — Consult Note (Signed)
I spoke with daughter, [name], by phone. I explained that pt may be a candidate for admission pending her mother's progress over the weekend and bed availability Monday. I currently have no discharges for Monday and we are at capacity. I recommended they pursue other rehab in case we are not an option. Tammy states one of her sister has planned to take off two weeks to assist for pt's care at home but had not expected this to occur until after 1-2 weeks of rehab. I will contact RN CM and then follow up on Monday. [phone]

## 2015-06-13 NOTE — Progress Notes (Signed)
   Subjective: 1 Day Post-Op Procedure(s) (LRB): TOTAL KNEE BILATERAL (Bilateral) Patient reports pain as moderate.   Patient seen in rounds with Dr. Wynelle Link. Patient is well, and has had no acute complaints or problems. She reports that she did not get any rest last night. No SOB or chest pain.  Plan is to go [place] after hospital stay.  Objective: Vital signs in last 24 hours: Temp:  [97.3 F (36.3 C)-99.3 F (37.4 C)] 98.1 F (36.7 C) (06/17 0708) Pulse Rate:  [74-94] 79 (06/17 0708) Resp:  [11-20] 20 (06/17 0708) BP: (125-171)/(59-128) 147/111 mmHg (06/17 0708) SpO2:  [91 %-100 %] 99 % (06/17 0708)  Intake/Output from previous day:  Intake/Output Summary (Last 24 hours) at 06/13/15 0826 Last data filed at 06/13/15 0714  Gross per 24 hour  Intake 4311.25 ml  Output   5215 ml  Net -903.75 ml    Intake/Output this shift: Total I/O In: -  Out: 700 [Urine:700]  Labs:  Recent Labs  06/13/15 0421  HGB 9.2*    Recent Labs  06/13/15 0421  WBC 12.8*  RBC 3.13*  HCT 28.7*  PLT 293    Recent Labs  06/13/15 0421  NA 139  K 3.8  CL 108  CO2 24  BUN 19  CREATININE 0.72  GLUCOSE 175*  CALCIUM 8.8*    Recent Labs  06/13/15 0421  INR 1.07    EXAM General - Patient is Alert and Oriented Extremity - Neurologically intact Intact pulses distally Compartment soft Dressing - dressing C/D/I Hemovac pulled without difficulty.  Past Medical History  Diagnosis Date  . Hyperlipidemia   . Hypothyroidism   . CAD (coronary artery disease)     non-obstructive. cath 11/11  . Hypertension   . History of phobia     clastrophobia  . Diabetes mellitus     borderline diabetic - diet controlled   . Kidney stones     hx of   . Urinary tract infection     hx of   . Arthritis   . Psoriasis     Assessment/Plan: 1 Day Post-Op Procedure(s) (LRB): TOTAL KNEE BILATERAL (Bilateral) Principal Problem:   OA  (osteoarthritis) of knee  Estimated body mass index is 26.76 kg/(m^2) as calculated from the following:   Height as of this encounter: 5\' 4"  (1.626 m).   Weight as of this encounter: 70.761 kg (156 lb). Advance diet Continue foley due to epidural   DVT Prophylaxis - Coumadin Weight-Bearing as tolerated   Epidural will be DC'd tomorrow morning. Plan for DC of foley 6 hours after removal of epidural. Start Lovenox 30 BID 12 hours after epidural removed. Plan for DC to CIR or SNF Monday.   Ardeen Jourdain, PA-C Orthopaedic Surgery 06/13/2015, 8:26 AM

## 2015-06-13 NOTE — Progress Notes (Signed)
Physical Therapy Treatment Patient Details Name: Renee [name] MRN: [id number] DOB: [birth year] Today's Date: 06/13/2015    History of Present Illness B TKA    PT Comments    Progressing well with PT. Min assist for recliner to bed. Performed B TKA exercises, able to do SLR RLE. Good progress expected.   Follow Up Recommendations  CIR;Supervision/Assistance - 24 hour     Equipment Recommendations  None recommended by PT    Recommendations for Other Services OT consult     Precautions / Restrictions Precautions Precautions: Knee Precaution Comments: pt was able to perform R SLR, has epidural in place Required Braces or Orthoses: Knee Immobilizer - Left Knee Immobilizer - Left: Discontinue once straight leg raise with < 10 degree lag Restrictions Weight Bearing Restrictions: No Other Position/Activity Restrictions: WBAT    Mobility  Bed Mobility Overal bed mobility: Needs Assistance Bed Mobility: Sit to Supine     Supine to sit: Mod assist;+2 for physical assistance;+2 for safety/equipment Sit to supine: Min assist   General bed mobility comments: min A for LLE  Transfers Overall transfer level: Needs assistance Equipment used: Rolling walker (2 wheeled) Transfers: Sit to/from [facility] Sit to Stand: Mod assist Stand pivot transfers: Mod assist       General transfer comment: assist to rise from recliner, cues for hand/leg placement  Ambulation/Gait Ambulation/Gait assistance: Min assist Ambulation Distance (Feet): 4 Feet Assistive device: Rolling walker (2 wheeled) Gait Pattern/deviations: Step-to pattern;Decreased step length - left   Gait velocity interpretation: Below normal speed for age/gender General Gait Details: difficulty advancing LLE, heavy use of BUEs   Stairs            Wheelchair Mobility    Modified Rankin (Stroke Patients Only)       Balance Overall balance assessment: Needs assistance Sitting-balance  support: Feet supported;Bilateral upper extremity supported Sitting balance-Leahy Scale: Fair       Standing balance-Leahy Scale: Poor                      Cognition Arousal/Alertness: Awake/alert Behavior During Therapy: WFL for tasks assessed/performed Overall Cognitive Status: Within Functional Limits for tasks assessed                      Exercises Total Joint Exercises Ankle Circles/Pumps: AROM;Both;10 reps;Supine Quad Sets: AROM;Both;10 reps;Supine Short Arc Quad: AROM;AAROM;Both;10 reps;Supine Heel Slides: AAROM;Both;10 reps;Supine Hip ABduction/ADduction: AAROM;Both;10 reps Straight Leg Raises: AROM;AAROM;Both;10 reps;Supine Goniometric ROM: 70* B knee flexion AAROM (with epidural)    General Comments        Pertinent Vitals/Pain Pain Assessment: No/denies pain Pain Score: 0-No pain Pain Intervention(s): Premedicated before session;Ice applied;Other (comment) (pt has epidural)    Home Living Family/patient expects to be discharged to:: Inpatient rehab Living Arrangements: Alone     Home Access: Stairs to enter Entrance Stairs-Rails: Right;Left   Home Equipment: Walker - 2 wheels;Walker - 4 wheels      Prior Function Level of Independence: Independent          PT Goals (current goals can now be found in the care plan section) Acute Rehab PT Goals Patient Stated Goal: to walk at [facility], to dance PT Goal Formulation: With patient/family Time For Goal Achievement: 06/27/15 Potential to Achieve Goals: Good Progress towards PT goals: Progressing toward goals    Frequency  7X/week    PT Plan Current plan remains appropriate    Co-evaluation PT/OT/SLP Co-Evaluation/Treatment: Yes Reason for Co-Treatment: For  patient/therapist safety PT goals addressed during session: Mobility/safety with mobility OT goals addressed during session: ADL's and self-care     End of Session Equipment Utilized During Treatment: Gait belt Activity  Tolerance: Patient tolerated treatment well Patient left: with call bell/phone within reach;with family/visitor present;in bed     Time: 5732-2025 PT Time Calculation (min) (ACUTE ONLY): 18 min  Charges:  $Therapeutic Exercise: 8-22 mins $Therapeutic Activity: 8-22 mins                    G Codes:      Philomena Doheny 06/13/2015, 2:06 PM [phone]

## 2015-06-13 NOTE — Care Management Note (Signed)
Case Management Note  Patient Details  Name: LILIAN FUHS MRN: [id number] Date of Birth: [birth year]  Subjective/Objective:              tkr      Action/Plan: cir   Expected Discharge Date:         072016         Expected Discharge Plan:     In-House Referral:     Discharge planning Services  CM Consult  Post Acute Care Choice:    Choice offered to:     DME Arranged:    DME Agency:     HH Arranged:    HH Agency:     Status of Service:  In process, will continue to follow  Medicare Important Message Given:    Date Medicare IM Given:    Medicare IM give by:    Date Additional Medicare IM Given:    Additional Medicare Important Message give by:     If discussed at [place] of Stay Meetings, dates discussed:    Additional Comments:  Leeroy Cha, RN 06/13/2015, 10:14 AM

## 2015-06-13 NOTE — Progress Notes (Signed)
I will follow up on this pt after rehabilitation MD consult complete. I can be reached at [phone] with questions.

## 2015-06-13 NOTE — Evaluation (Signed)
Occupational Therapy Evaluation Patient Details Name: SAMARA STANKOWSKI MRN: [id number] DOB: 24-Dec-1946 Today's Date: 06/13/2015    History of Present Illness B TKA   Clinical Impression   This 69 year old female was admitted for the above surgery.  She was independent prior to admission and currently needs mod A x 2 for mobility and mod to max A x 2 for LB adls.  Of note, pt had epidural in place during evaluation.  Pt will benefit from skilled OT in acute.  Goals are set for min A level.  Recommend CIR for continued rehab.    Follow Up Recommendations  CIR    Equipment Recommendations  3 in 1 bedside comode    Recommendations for Other Services       Precautions / Restrictions Precautions Precautions: Knee Precaution Comments: pt was able to lift RLE, SLR, has epidural in place Required Braces or Orthoses: Knee Immobilizer - Left Knee Immobilizer - Left: Discontinue once straight leg raise with < 10 degree lag Restrictions Weight Bearing Restrictions: No Other Position/Activity Restrictions: WBAT      Mobility Bed Mobility Overal bed mobility: Needs Assistance Bed Mobility: Supine to Sit     Supine to sit: Mod assist;+2 for physical assistance;+2 for safety/equipment     General bed mobility comments: cues for technique, mod A to support LLE   Transfers Overall transfer level: Needs assistance   Transfers: Sit to/from Stand Sit to Stand: +2 physical assistance;Mod assist;From elevated surface         General transfer comment: assist to rise from elevated bed, cues for hand/leg placement    Balance Overall balance assessment: Needs assistance Sitting-balance support: Feet supported;Bilateral upper extremity supported Sitting balance-Leahy Scale: Fair       Standing balance-Leahy Scale: Poor                              ADL Overall ADL's : Needs assistance/impaired             Lower Body Bathing: Moderate assistance;+2 for  safety/equipment;+2 for physical assistance;Sit to/from stand       Lower Body Dressing: Moderate assistance;+2 for physical assistance;+2 for safety/equipment;Sit to/from stand   Toilet Transfer: Moderate assistance;+2 for physical assistance;Stand-pivot;RW (to recliner)             General ADL Comments: pt is able to perform UB adls with set up.  At time of evaluation, epidural was still in place and she did not have pain.  Educated on Secondary school teacher and sock aid for adls.  Pt has a Secondary school teacher at home     Vision     Perception     Praxis      Pertinent Vitals/Pain Pain Assessment: 0-10 Pain Score: 0-No pain Pain Intervention(s): Premedicated before session;Ice applied;Other (comment) (pt has epidural)     Hand Dominance     Extremity/Trunk Assessment Upper Extremity Assessment Upper Extremity Assessment: Overall WFL for tasks assessed (arms have good strength, decreased grip strength/trigger finger on R )      Cervical / Trunk Assessment Cervical / Trunk Assessment: Normal   Communication Communication Communication: No difficulties   Cognition Arousal/Alertness: Awake/alert Behavior During Therapy: WFL for tasks assessed/performed Overall Cognitive Status: Within Functional Limits for tasks assessed                     General Comments       Exercises  Shoulder Instructions      Home Living Family/patient expects to be discharged to:: Inpatient rehab Living Arrangements: Alone     Home Access: Stairs to enter Entrance Stairs-Number of Steps: 5 Entrance Stairs-Rails: Right;Left                 Home Equipment: Walker - 2 wheels;Walker - 4 wheels          Prior Functioning/Environment Level of Independence: Independent             OT Diagnosis: Generalized weakness   OT Problem List: Decreased strength;Decreased activity tolerance;Decreased knowledge of use of DME or AE (epidural in place--will likely have pain during  recovery)   OT Treatment/Interventions: Self-care/ADL training;DME and/or AE instruction;Patient/family education    OT Goals(Current goals can be found in the care plan section) Acute Rehab OT Goals Patient Stated Goal: to walk at [facility], to dance OT Goal Formulation: With patient Time For Goal Achievement: 06/20/15 Potential to Achieve Goals: Good ADL Goals Pt Will Perform Grooming: with min guard assist;standing Pt Will Perform Lower Body Bathing: with min assist;with adaptive equipment;sit to/from stand Pt Will Transfer to Toilet: with min assist;ambulating;bedside commode Pt Will Perform Toileting - Clothing Manipulation and hygiene: with min assist;sit to/from stand  OT Frequency: Min 2X/week   Barriers to D/C:            Co-evaluation PT/OT/SLP Co-Evaluation/Treatment: Yes Reason for Co-Treatment: For patient/therapist safety PT goals addressed during session: Mobility/safety with mobility OT goals addressed during session: ADL's and self-care      End of Session Equipment Utilized During Treatment: Gait belt;Rolling walker  Activity Tolerance: Patient tolerated treatment well Patient left: in bed;with call bell/phone within reach;with family/visitor present   Time: 1040-1105 OT Time Calculation (min): 25 min Charges:  OT General Charges $OT Visit: 1 Procedure OT Evaluation $Initial OT Evaluation Tier I: 1 Procedure G-Codes:    Emmory Solivan 06/15/2015, 12:48 PM  Lesle Chris, OTR/L [phone] 2015-06-15

## 2015-06-13 NOTE — Progress Notes (Signed)
Date:  June 13, 2015 U.R. performed for needs and level of care. Will continue to follow for Case Management needs.  Velva Harman, RN, BSN, [place]   [phone]

## 2015-06-13 NOTE — Discharge Instructions (Addendum)
° °Dr. Frank Aluisio °Total Joint Specialist °[facility] °[address] °[place], [place] [address] °[phone] ° °TOTAL KNEE REPLACEMENT POSTOPERATIVE DIRECTIONS ° °Knee Rehabilitation, Guidelines Following Surgery  °Results after knee surgery are often greatly improved when you follow the exercise, range of motion and muscle strengthening exercises prescribed by your doctor. Safety measures are also important to protect the knee from further injury. Any time any of these exercises cause you to have increased pain or swelling in your knee joint, decrease the amount until you are comfortable again and slowly increase them. If you have problems or questions, call your caregiver or physical therapist for advice.  ° °HOME CARE INSTRUCTIONS  °Remove items at home which could result in a fall. This includes throw rugs or furniture in walking pathways.  °· ICE to the affected knee every three hours for 30 minutes at a time and then as needed for pain and swelling.  Continue to use ice on the knee for pain and swelling from surgery. You may notice swelling that will progress down to the foot and ankle.  This is normal after surgery.  Elevate the leg when you are not up walking on it.   °· Continue to use the breathing machine which will help keep your temperature down.  It is common for your temperature to cycle up and down following surgery, especially at night when you are not up moving around and exerting yourself.  The breathing machine keeps your lungs expanded and your temperature down. °· Do not place pillow under knee, focus on keeping the knee straight while resting ° °DIET °You may resume your previous home diet once your are discharged from the hospital. ° °DRESSING / WOUND CARE / SHOWERING °You may shower 3 days after surgery, but keep the wounds dry during showering.  You may use an occlusive plastic wrap (Press'n Seal for example), NO SOAKING/SUBMERGING IN THE BATHTUB.  If the  bandage gets wet, change with a clean dry gauze.  If the incision gets wet, pat the wound dry with a clean towel. °You may start showering once you are discharged home but do not submerge the incision under water. Just pat the incision dry and apply a dry gauze dressing on daily. °Change the surgical dressing daily and reapply a dry dressing each time. ° °ACTIVITY °Walk with your walker as instructed. °Use walker as long as suggested by your caregivers. °Avoid periods of inactivity such as sitting longer than an hour when not asleep. This helps prevent blood clots.  °You may resume a sexual relationship in one month or when given the OK by your doctor.  °You may return to work once you are cleared by your doctor.  °Do not drive a car for 6 weeks or until released by you surgeon.  °Do not drive while taking narcotics. ° °WEIGHT BEARING °Weight bearing as tolerated with assist device (walker, cane, etc) as directed, use it as long as suggested by your surgeon or therapist, typically at least 4-6 weeks. ° °POSTOPERATIVE CONSTIPATION PROTOCOL °Constipation - defined medically as fewer than three stools per week and severe constipation as less than one stool per week. ° °One of the most common issues patients have following surgery is constipation.  Even if you have a regular bowel pattern at home, your normal regimen is likely to be disrupted due to multiple reasons following surgery.  Combination of anesthesia, postoperative narcotics, change in appetite and fluid intake all can affect your bowels.    In order to avoid complications following surgery, here are some recommendations in order to help you during your recovery period. ° °Colace (docusate) - Pick up an over-the-counter form of Colace or another stool softener and take twice a day as long as you are requiring postoperative pain medications.  Take with a full glass of water daily.  If you experience loose stools or diarrhea, hold the colace until you stool forms  back up.  If your symptoms do not get better within 1 week or if they get worse, check with your doctor. ° °Dulcolax (bisacodyl) - Pick up over-the-counter and take as directed by the product packaging as needed to assist with the movement of your bowels.  Take with a full glass of water.  Use this product as needed if not relieved by Colace only.  ° °MiraLax (polyethylene glycol) - Pick up over-the-counter to have on hand.  MiraLax is a solution that will increase the amount of water in your bowels to assist with bowel movements.  Take as directed and can mix with a glass of water, juice, soda, coffee, or tea.  Take if you go more than two days without a movement. °Do not use MiraLax more than once per day. Call your doctor if you are still constipated or irregular after using this medication for 7 days in a row. ° °If you continue to have problems with postoperative constipation, please contact the office for further assistance and recommendations.  If you experience "the worst abdominal pain ever" or develop nausea or vomiting, please contact the office immediatly for further recommendations for treatment. ° °ITCHING ° If you experience itching with your medications, try taking only a single pain pill, or even half a pain pill at a time.  You can also use Benadryl over the counter for itching or also to help with sleep.  ° °TED HOSE STOCKINGS °Wear the elastic stockings on both legs for three weeks following surgery during the day but you may remove then at night for sleeping. ° °MEDICATIONS °See your medication summary on the “After Visit Summary” that the nursing staff will review with you prior to discharge.  You may have some home medications which will be placed on hold until you complete the course of blood thinner medication.  It is important for you to complete the blood thinner medication as prescribed by your surgeon.  Continue your approved medications as instructed at time of  discharge. ° °PRECAUTIONS °If you experience chest pain or shortness of breath - call 911 immediately for transfer to the hospital emergency department.  °If you develop a fever greater that 101 F, purulent drainage from wound, increased redness or drainage from wound, foul odor from the wound/dressing, or calf pain - CONTACT YOUR SURGEON.   °                                                °FOLLOW-UP APPOINTMENTS °Make sure you keep all of your appointments after your operation with your surgeon and caregivers. You should call the office at the above phone number and make an appointment for approximately two weeks after the date of your surgery or on the date instructed by your surgeon outlined in the "After Visit Summary". ° ° °RANGE OF MOTION AND STRENGTHENING EXERCISES  °Rehabilitation of the knee is important following a knee injury or   an operation. After just a few days of immobilization, the muscles of the thigh which control the knee become weakened and shrink (atrophy). Knee exercises are designed to build up the tone and strength of the thigh muscles and to improve knee motion. Often times heat used for twenty to thirty minutes before working out will loosen up your tissues and help with improving the range of motion but do not use heat for the first two weeks following surgery. These exercises can be done on a training (exercise) mat, on the floor, on a table or on a bed. Use what ever works the best and is most comfortable for you Knee exercises include:  Leg Lifts - While your knee is still immobilized in a splint or cast, you can do straight leg raises. Lift the leg to 60 degrees, hold for 3 sec, and slowly lower the leg. Repeat 10-20 times 2-3 times daily. Perform this exercise against resistance later as your knee gets better.  Quad and Hamstring Sets - Tighten up the muscle on the front of the thigh (Quad) and hold for 5-10 sec. Repeat this 10-20 times hourly. Hamstring sets are done by pushing the  foot backward against an object and holding for 5-10 sec. Repeat as with quad sets.   Leg Slides: Lying on your back, slowly slide your foot toward your buttocks, bending your knee up off the floor (only go as far as is comfortable). Then slowly slide your foot back down until your leg is flat on the floor again.  Angel Wings: Lying on your back spread your legs to the side as far apart as you can without causing discomfort.  A rehabilitation program following serious knee injuries can speed recovery and prevent re-injury in the future due to weakened muscles. Contact your doctor or a physical therapist for more information on knee rehabilitation.   IF YOU ARE TRANSFERRED TO A SKILLED REHAB FACILITY If the patient is transferred to a skilled rehab facility following release from the hospital, a list of the current medications will be sent to the facility for the patient to continue.  When discharged from the skilled rehab facility, please have the facility set up the patient's [place] prior to being released. Also, the skilled facility will be responsible for providing the patient with their medications at time of release from the facility to include their pain medication, the muscle relaxants, and their blood thinner medication. If the patient is still at the rehab facility at time of the two week follow up appointment, the skilled rehab facility will also need to assist the patient in arranging follow up appointment in our office and any transportation needs.  MAKE SURE YOU:  Understand these instructions.  Get help right away if you are not doing well or get worse.    Pick up stool softner and laxative for home use following surgery while on pain medications. Do not submerge incision under water. Please use good hand washing techniques while changing dressing each day. May shower starting three days after surgery. Please use a clean towel to pat the incision dry following  showers. Continue to use ice for pain and swelling after surgery. Do not use any lotions or creams on the incision until instructed by your surgeon.  Take Coumadin for a total of four weeks and then discontinue.  The dose may need to be adjusted based upon the INR.  Please follow the INR and titrate Coumadin dose for a therapeutic range  between 2.0 and 3.0 INR.  After completing the four weeks of Coumadin, the patient may stop the Coumadin and resume their 81 mg Aspirin daily.  Continue Lovenox injections until the INR is therapeutic at or greater than 2.0.  When INR reaches the therapeutic level of equal to or greater than 2.0, the patient may discontinue the Lovenox injections.   Information on my medicine - Coumadin   (Warfarin)  This medication education was reviewed with me or my healthcare representative as part of my discharge preparation.  The pharmacist that spoke with me during my hospital stay was:  Rudean Haskell, [facility]  Why was Coumadin prescribed for you? Coumadin was prescribed for you because you have a blood clot or a medical condition that can cause an increased risk of forming blood clots. Blood clots can cause serious health problems by blocking the flow of blood to the heart, lung, or brain. Coumadin can prevent harmful blood clots from forming. As a reminder your indication for Coumadin is:   Select from menu  What test will check on my response to Coumadin? While on Coumadin (warfarin) you will need to have an INR test regularly to ensure that your dose is keeping you in the desired range. The INR (international normalized ratio) number is calculated from the result of the laboratory test called prothrombin time (PT).  If an INR APPOINTMENT HAS NOT ALREADY BEEN MADE FOR YOU please schedule an appointment to have this lab work done by your health care provider within 7 days. Your INR goal is usually a number between:  2 to 3 or your provider may give you a more narrow  range like 2-2.5.  Ask your health care provider during an office visit what your goal INR is.  What  do you need to  know  About  COUMADIN? Take Coumadin (warfarin) exactly as prescribed by your healthcare provider about the same time each day.  DO NOT stop taking without talking to the doctor who prescribed the medication.  Stopping without other blood clot prevention medication to take the place of Coumadin may increase your risk of developing a new clot or stroke.  Get refills before you run out.  What do you do if you miss a dose? If you miss a dose, take it as soon as you remember on the same day then continue your regularly scheduled regimen the next day.  Do not take two doses of Coumadin at the same time.  Important Safety Information A possible side effect of Coumadin (Warfarin) is an increased risk of bleeding. You should call your healthcare provider right away if you experience any of the following: ? Bleeding from an injury or your nose that does not stop. ? Unusual colored urine (red or dark brown) or unusual colored stools (red or black). ? Unusual bruising for unknown reasons. ? A serious fall or if you hit your head (even if there is no bleeding).  Some foods or medicines interact with Coumadin (warfarin) and might alter your response to warfarin. To help avoid this: ? Eat a balanced diet, maintaining a consistent amount of Vitamin K. ? Notify your provider about major diet changes you plan to make. ? Avoid alcohol or limit your intake to 1 drink for women and 2 drinks for men per day. (1 drink is 5 oz. wine, 12 oz. beer, or 1.5 oz. liquor.)  Make sure that ANY health care provider who prescribes medication for you knows that you are taking  Coumadin (warfarin).  Also make sure the healthcare provider who is monitoring your Coumadin knows when you have started a new medication including herbals and non-prescription products.  Coumadin (Warfarin)  Major Drug Interactions   Increased Warfarin Effect Decreased Warfarin Effect  Alcohol (large quantities) Antibiotics (esp. Septra/Bactrim, Flagyl, Cipro) Amiodarone (Cordarone) Aspirin (ASA) Cimetidine (Tagamet) Megestrol (Megace) NSAIDs (ibuprofen, naproxen, etc.) Piroxicam (Feldene) Propafenone (Rythmol SR) Propranolol (Inderal) Isoniazid (INH) Posaconazole (Noxafil) Barbiturates (Phenobarbital) Carbamazepine (Tegretol) Chlordiazepoxide (Librium) Cholestyramine (Questran) Griseofulvin Oral Contraceptives Rifampin Sucralfate (Carafate) Vitamin K   Coumadin (Warfarin) Major Herbal Interactions  Increased Warfarin Effect Decreased Warfarin Effect  Garlic Ginseng Ginkgo biloba Coenzyme Q10 Green tea St. Johns wort    Coumadin (Warfarin) FOOD Interactions  Eat a consistent number of servings per week of foods HIGH in Vitamin K (1 serving =  cup)  Collards (cooked, or boiled & drained) Kale (cooked, or boiled & drained) Mustard greens (cooked, or boiled & drained) Parsley *serving size only =  cup Spinach (cooked, or boiled & drained) Swiss chard (cooked, or boiled & drained) Turnip greens (cooked, or boiled & drained)  Eat a consistent number of servings per week of foods MEDIUM-HIGH in Vitamin K (1 serving = 1 cup)  Asparagus (cooked, or boiled & drained) Broccoli (cooked, boiled & drained, or raw & chopped) Brussel sprouts (cooked, or boiled & drained) *serving size only =  cup Lettuce, raw (green leaf, endive, romaine) Spinach, raw Turnip greens, raw & chopped   These websites have more information on Coumadin (warfarin):  FailFactory.se; VeganReport.com.au;

## 2015-06-13 NOTE — Progress Notes (Signed)
[place] for Warfarin Indication: VTE prophylaxis  Allergies  Allergen Reactions  . Other Rash    Metal   . Codeine Nausea And Vomiting  . Metformin Nausea And Vomiting  . Simvastatin Swelling and Rash   Patient Measurements: Height: 5\' 4"  (162.6 cm) Weight: 156 lb (70.761 kg) IBW/kg (Calculated) : 54.7  Vital Signs: Temp: 98.1 F (36.7 C) (06/17 1035) Temp Source: Axillary (06/17 1035) BP: 155/70 mmHg (06/17 1035) Pulse Rate: 76 (06/17 1035)  Labs:  Recent Labs  06/13/15 0421  HGB 9.2*  HCT 28.7*  PLT 293  LABPROT 14.1  INR 1.07  CREATININE 0.72    Estimated Creatinine Clearance: 63.1 mL/min (by C-G formula based on Cr of 0.72).  Medical History: Past Medical History  Diagnosis Date  . Hyperlipidemia   . Hypothyroidism   . CAD (coronary artery disease)     non-obstructive. cath 11/11  . Hypertension   . History of phobia     clastrophobia  . Diabetes mellitus     borderline diabetic - diet controlled   . Kidney stones     hx of   . Urinary tract infection     hx of   . Arthritis   . Psoriasis    Medications:  Scheduled:  . acetaminophen  1,000 mg Oral 4 times per day  . docusate sodium  100 mg Oral BID  . gemfibrozil  600 mg Oral QAC breakfast  . levothyroxine  75 mcg Oral QAC breakfast  . Warfarin - Pharmacist Dosing Inpatient   Does not apply q1800    Assessment: 28 yoF s/p bilateral TKA. Warfarin ordered for VTE prophylaxis, Pharmacy to dose. Not on any anticoagulant PTA.  Baseline INR on 6/10 = 0.97, no anticoagulants prior to admit  Ropivacaine epidural anesthesia; first dose Warfarin to be given 6/17 @ 10am  Today, 06/13/2015: POD#1 First dose warfarin (2.5 mg) given today at 0942 Has orders from ortho to begin Lovenox 30 mg SQ q12h starting twelve hours after epidural catheter is removed - anticipate this will occur tomorrow.  INR remains WNL H/H low c/w ABLA Drug interactions:  Gemfibrozil  often increases INR response to warfarin Diet: Regular, charted as eating 75% of breakfast today.   Goal Range:  INR 2-3   Plan:  1. Daily PT/INR while inpatient 2. Next warfarin dose due tomorrow. 3. When epidural catheter is removed, 12 hours later begin Lovenox 30 mg SQ q12h and continue until INR 2.0 or above on warfarin. 4. Warfarin teaching  Clayburn Pert, PharmD, BCPS Pager: [phone] 06/13/2015  12:31 PM

## 2015-06-13 NOTE — Clinical Social Work Placement (Signed)
   CLINICAL SOCIAL WORK PLACEMENT  NOTE  Date:  06/13/2015  Patient Details  Name: Renee [name] MRN: [id number] Date of Birth: 09/13/1946  Clinical Social Work is seeking post-discharge placement for this patient at the [place] level of care (*CSW will initial, date and re-position this form in  chart as items are completed):  Yes   Patient/family provided with [place] Work Department's list of facilities offering this level of care within the geographic area requested by the patient (or if unable, by the patient's family).  Yes   Patient/family informed of their freedom to choose among providers that offer the needed level of care, that participate in Medicare, Medicaid or managed care program needed by the patient, have an available bed and are willing to accept the patient.  Yes   Patient/family informed of [place]'s ownership interest in [facility] and [facility] [address] as well as of the fact that they are under no obligation to receive care at these facilities.  PASRR submitted to EDS on 06/13/15     PASRR number received on 06/13/15     Existing PASRR number confirmed on       FL2 transmitted to all facilities in geographic area requested by pt/family on 06/13/15     FL2 transmitted to all facilities within larger geographic area on       Patient informed that his/her managed care company has contracts with or will negotiate with certain facilities, including the following:            Patient/family informed of bed offers received.  Patient chooses bed at       Physician recommends and patient chooses bed at      Patient to be transferred to   on  .  Patient to be transferred to facility by       Patient family notified on   of transfer.  Name of family member notified:        PHYSICIAN Please sign FL2     Additional Comment:    _______________________________________________ Ladell Pier, LCSW 06/13/2015, 5:27  PM

## 2015-06-13 NOTE — Anesthesia Post-op Follow-up Note (Signed)
  Anesthesia Pain Follow-up Note  [name]  Day #: 1 Date of Follow-up: 06/13/2015 Time: 7:37 PM  Last Vitals:  Filed Vitals:   06/13/15 1740  BP: 186/79  Pulse: 84  Temp: 36.6 C  Resp: 18    Level of Consciousness: alert  Pain: none   Side Effects:None, patient able to ambulate with assistance  Catheter Site Exam:clean, dry, no drainage  Plan: Continue current therapy  Sameena Artus,E. Raeana Blinn

## 2015-06-13 NOTE — Consult Note (Signed)
Physical Medicine and Rehabilitation Consult Reason for Consult: Bilateral total knee arthroplasty secondary to osteoarthritis Referring Physician: Joette Catching   HPI: Renee [name] is a 69 y.o. right handed female with history of CAD maintained on aspirin 81 mg daily, diet controlled diabetes mellitus. Lives alone independent prior to admission. Presented 06/12/2015 with bilateral knee pain due to end-stage osteoarthritis. No relief with intra-articular injections of corticosteroid, nonsteroidal  anti-inflammatory his and activity modification. Underwent bilateral total knee arthroplasty 06/12/2015 per Dr.Alusio. Hospital course pain management. Placed on Coumadin for DVT prophylaxis. Weightbearing as tolerated lower extremities. Physical and occupational therapy evaluations pending. M.D. has requested physical medicine rehabilitation consult.  Was Independent PTA.  Driving and mowing lawn. No UE problems except hand OA, no Hip OA, has great toe jt affected by "arthritis" Epidural "heavier " on Left side Review of Systems  Constitutional: Negative for fever and chills.  Eyes: Negative for double vision.  Respiratory: Negative for cough and shortness of breath.   Cardiovascular: Negative for chest pain and palpitations.  Gastrointestinal: Positive for nausea and constipation.  Genitourinary:       Recurrent UTIs  Musculoskeletal: Positive for myalgias and joint pain.  Skin:       Psoriasis  Neurological: Negative for dizziness and headaches.   Past Medical History  Diagnosis Date  . Hyperlipidemia   . Hypothyroidism   . CAD (coronary artery disease)     non-obstructive. cath 11/11  . Hypertension   . History of phobia     clastrophobia  . Diabetes mellitus     borderline diabetic - diet controlled   . Kidney stones     hx of   . Urinary tract infection     hx of   . Arthritis   . Psoriasis    Past Surgical History  Procedure Laterality Date  . Appendectomy    .  Tubes tied      and untied. and tied again  . Carpal tunnel release      Left trigger finger  . Cardiac catheterization      2011  . Tubal ligation    . Eye surgery      lasik - right eye    Family History  Problem Relation Age of Onset  . Family history unknown: Yes   Social History:  reports that she has never smoked. She has never used smokeless tobacco. She reports that she drinks alcohol. She reports that she does not use illicit drugs. Allergies:  Allergies  Allergen Reactions  . Other Rash    Metal   . Codeine Nausea And Vomiting  . Metformin Nausea And Vomiting  . Simvastatin Swelling and Rash   Medications Prior to Admission  Medication Sig Dispense Refill  . acetaminophen (TYLENOL) 500 MG tablet Take 1,000 mg by mouth every 6 (six) hours as needed for moderate pain or headache.    Marland Kitchen aspirin 81 MG EC tablet Take 81 mg by mouth every morning.     . Emollient (CERAVE) CREA Apply 1 application topically daily as needed (psoriasis).     . fish oil-omega-3 fatty acids 1000 MG capsule Take 1 g by mouth every evening.     . fluocinonide (LIDEX) 0.05 % cream Apply 1 application topically at bedtime.     Marland Kitchen gemfibrozil (LOPID) 600 MG tablet Take 600 mg by mouth every morning.     Marland Kitchen ibuprofen (ADVIL,MOTRIN) 200 MG tablet Take 800 mg by mouth every 6 (six) hours as  needed for headache or moderate pain.    Marland Kitchen levothyroxine (SYNTHROID, LEVOTHROID) 75 MCG tablet Take 75 mcg by mouth every morning.     Marland Kitchen lisinopril (PRINIVIL,ZESTRIL) 20 MG tablet Take 20 mg by mouth every morning.     . triamcinolone (KENALOG) 0.1 % cream Apply 1 application topically at bedtime.     . nitroGLYCERIN (NITROSTAT) 0.4 MG SL tablet Place 1 tablet (0.4 mg total) under the tongue every 5 (five) minutes as needed. 30 tablet 6    Home: Home Living Family/patient expects to be discharged to:: Inpatient rehab Living Arrangements: Alone  Functional History:   Functional Status:  Mobility:           ADL:    Cognition: Cognition Orientation Level: Oriented X4    Blood pressure 154/63, pulse 94, temperature 97.6 F (36.4 C), temperature source Oral, resp. rate 16, height 5\' 4"  (1.626 m), weight 70.761 kg (156 lb), SpO2 95 %. Physical Exam  Constitutional: She is oriented to person, place, and time. She appears well-developed.  HENT:  Head: Normocephalic.  Eyes: EOM are normal.  Neck: Normal range of motion. Neck supple. No thyromegaly present.  Cardiovascular: Normal rate and regular rhythm.   Respiratory: Effort normal and breath sounds normal. No respiratory distress.  GI: Soft. Bowel sounds are normal. She exhibits no distension.  Neurological: She is alert and oriented to person, place, and time.  Skin:  Bilateral knee incisions are dressed appropriately tender  5/5 in Bilateral Delt, bi tri grip 3/5 R HF, KE, 4/5 R ADF, 2- Left hip flexion, knee extension, 4 minus ankle dorsiflexion plantarflexion Sensation intact to light touch bilateral lower extremities Osteoarthritic changes bilateral DIPs  Results for orders placed or performed during the hospital encounter of 06/12/15 (from the past 24 hour(s))  Glucose, capillary     Status: Abnormal   Collection Time: 06/12/15  7:28 AM  Result Value Ref Range   Glucose-Capillary 128 (H) 65 - 99 mg/dL   No results found.  Assessment/Plan: Diagnosis: Bilateral end-stage osteoarthritis of the knees status post bilateral TKR 06/12/2015 1. Does the need for close, 24 hr/day medical supervision in concert with the patient's rehab needs make it unreasonable for this patient to be served in a less intensive setting? Yes 2. Co-Morbidities requiring supervision/potential complications: Acute blood loss anemia, Coronary artery disease, diabetes mellitus 3. Due to bladder management, bowel management, safety, skin/wound care, disease management, medication administration, pain management and patient education, does the patient require  24 hr/day rehab nursing? Yes 4. Does the patient require coordinated care of a physician, rehab nurse, PT (1-2 hrs/day, 5 days/week) and OT (1-2 hrs/day, 5 days/week) to address physical and functional deficits in the context of the above medical diagnosis(es)? Yes Addressing deficits in the following areas: balance, endurance, locomotion, strength, transferring, bowel/bladder control, bathing, dressing, grooming and toileting 5. Can the patient actively participate in an intensive therapy program of at least 3 hrs of therapy per day at least 5 days per week? Potentially 6. The potential for patient to make measurable gains while on inpatient rehab is good 7. Anticipated functional outcomes upon discharge from inpatient rehab are modified independent  with PT, modified independent with OT, n/a with SLP. 8. Estimated rehab length of stay to reach the above functional goals is: 7-10 days 9. Does the patient have adequate social supports and living environment to accommodate these discharge functional goals? Yes 10. Anticipated D/C setting: Home 11. Anticipated post D/C treatments: [place] therapy 12. Overall Rehab/Functional  Prognosis: excellent  RECOMMENDATIONS: This patient's condition is appropriate for continued rehabilitative care in the following setting: CIR Patient has agreed to participate in recommended program. Yes Note that insurance prior authorization may be required for reimbursement for recommended care.  Comment: Will recommend CIR inpatient continues to require physical assistance for mobility and ADLs by postoperative day #3    06/13/2015

## 2015-06-13 NOTE — Evaluation (Signed)
Physical Therapy Evaluation Patient Details Name: Renee [name] MRN: [id number] DOB: [birth year] Today's Date: 06/13/2015   History of Present Illness  B TKA  Clinical Impression  Pt is s/p TKA resulting in the deficits listed below (see PT Problem List). Mod assist of 2 for OOB to recliner. Pt is able to perform SLR with RLE, LLE has decreased strength and sensation due to epidural. Good progress expected. CIR recommended.  Pt will benefit from skilled PT to increase their independence and safety with mobility to allow discharge to the venue listed below.      Follow Up Recommendations CIR;Supervision/Assistance - 24 hour    Equipment Recommendations  None recommended by PT    Recommendations for Other Services OT consult     Precautions / Restrictions Precautions Precautions: Knee Required Braces or Orthoses: Knee Immobilizer - Left (pt able to do SLR on RLE) Knee Immobilizer - Left: Discontinue once straight leg raise with < 10 degree lag Restrictions Weight Bearing Restrictions: No Other Position/Activity Restrictions: WBAT      Mobility  Bed Mobility Overal bed mobility: Needs Assistance Bed Mobility: Supine to Sit     Supine to sit: Mod assist;+2 for physical assistance;+2 for safety/equipment     General bed mobility comments: cues for technique, mod A to support LLE   Transfers Overall transfer level: Needs assistance   Transfers: Sit to/from Stand Sit to Stand: +2 physical assistance;Mod assist;From elevated surface         General transfer comment: assist to rise from elevated bed, cues for hand/leg placement  Ambulation/Gait Ambulation/Gait assistance: +2 safety/equipment;+2 physical assistance;Mod assist Ambulation Distance (Feet): 4 Feet Assistive device: Rolling walker (2 wheeled) Gait Pattern/deviations: Step-to pattern;Decreased step length - left   Gait velocity interpretation: Below normal speed for age/gender General Gait Details: difficulty  advancing LLE, heavy use of BUEs  Stairs            Wheelchair Mobility    Modified Rankin (Stroke Patients Only)       Balance Overall balance assessment: Needs assistance Sitting-balance support: Feet supported;Bilateral upper extremity supported Sitting balance-Leahy Scale: Fair       Standing balance-Leahy Scale: Poor                               Pertinent Vitals/Pain  0/10 B knees Pt has epidural. Ice applied to B knees following PT.     Home Living Family/patient expects to be discharged to:: Inpatient rehab Living Arrangements: Alone     Home Access: Stairs to enter Entrance Stairs-Rails: Right;Left Entrance Stairs-Number of Steps: 5   Home Equipment: Walker - 2 wheels;Walker - 4 wheels      Prior Function Level of Independence: Independent               Hand Dominance        Extremity/Trunk Assessment               Lower Extremity Assessment: LLE deficits/detail;RLE deficits/detail RLE Deficits / Details: SLR 3/5, knee ext 4/5, ankle WNL LLE Deficits / Details: decreased sensation to light touch, still has epidural, weak but intact quad set, SLR -2/5  Cervical / Trunk Assessment: Normal  Communication   Communication: No difficulties  Cognition Arousal/Alertness: Awake/alert Behavior During Therapy: WFL for tasks assessed/performed Overall Cognitive Status: Within Functional Limits for tasks assessed  General Comments      Exercises Total Joint Exercises Ankle Circles/Pumps: AROM;Both;10 reps;Supine Quad Sets: AROM;Both;10 reps;Supine Short Arc Quad: AROM;AAROM;Both;10 reps;Supine Heel Slides: AAROM;Both;10 reps;Supine Straight Leg Raises: AROM;AAROM;Both;10 reps;Supine Goniometric ROM: knee flexion 70* bilaterally AAROM, L knee ext -10*, R knee ext 0*      Assessment/Plan    PT Assessment Patient needs continued PT services  PT Diagnosis Difficulty walking;Generalized  weakness   PT Problem List Decreased strength;Decreased range of motion;Decreased activity tolerance;Decreased mobility;Pain;Decreased knowledge of use of DME;Decreased balance  PT Treatment Interventions DME instruction;Gait training;Stair training;Functional mobility training;Therapeutic activities;Patient/family education;Balance training;Therapeutic exercise   PT Goals (Current goals can be found in the Care Plan section) Acute Rehab PT Goals Patient Stated Goal: to walk at [facility], to dance PT Goal Formulation: With patient/family Time For Goal Achievement: 06/27/15 Potential to Achieve Goals: Good    Frequency 7X/week   Barriers to discharge        Co-evaluation PT/OT/SLP Co-Evaluation/Treatment: Yes Reason for Co-Treatment: Complexity of the patient's impairments (multi-system involvement);For patient/therapist safety PT goals addressed during session: Mobility/safety with mobility;Balance;Proper use of DME;Strengthening/ROM         End of Session Equipment Utilized During Treatment: Gait belt Activity Tolerance: Patient tolerated treatment well Patient left: in chair;with call bell/phone within reach;with family/visitor present Nurse Communication: Mobility status         Time: 3557-3220 PT Time Calculation (min) (ACUTE ONLY): 43 min   Charges:   PT Evaluation $Initial PT Evaluation Tier I: 1 Procedure PT Treatments $Therapeutic Exercise: 8-22 mins   PT G Codes:        Philomena Doheny 06/13/2015, 11:25 AM [phone]

## 2015-06-13 NOTE — Clinical Social Work Note (Signed)
Clinical Social Work Assessment  Patient Details  Name: Renee [name] MRN: [id number] Date of Birth: 1946/03/21  Date of referral:  06/13/15               Reason for consult:  Discharge Planning                Permission sought to share information with:  Family Supports Permission granted to share information::  Yes, Verbal Permission Granted (pt family at bedside)  Name::     [place]::     Relationship::  daughter  Contact Information:  [phone]  Housing/Transportation Living arrangements for the past 2 months:  Single Family Home Source of Information:  Patient, Adult Children Patient Interpreter Needed:  None Criminal Activity/Legal Involvement Pertinent to Current Situation/Hospitalization:  No - Comment as needed Significant Relationships:  None Lives with:  Self Do you feel safe going back to the place where you live?  No (prefers CIR or private room at SNF as second option to CIR) Need for family participation in patient care:  Yes (Comment)  Care giving concerns:  Pt lives alone. PT recommending CIR. SNF search needed for secondary options.   Social Worker assessment / plan:  CSW received notification from [facility] that pt hopeful for CIR, but CIR reporting at this time that CIR may not have a bed available when pt medically ready for discharge which is anticipated for Monday.  CSW met with pt and pt family at bedside. CSW introduced self and explained role. Pt expressed anxiousness about CIR and pt and pt family remain hopeful that CIR will have a bed to become available. CSW discussed secondary option of SNF and pt reported that unit social worker had discussed this as secondary option. Pt information was sent to [facility] facilities, but pt lives in [place] and interested in [place] SNF facilities as well. CSW provided support to pt and pt family.   Pt and pt family first choice remains [facility] and second choice would  be a private room at a rehab facility in [place]. CSW provided [facility] SNF bed offers and weekend social worker to follow up with [facility] offers.   CSW to continue to follow to provide support and assist with pt disposition planning.   Employment status:  Retired Forensic scientist:  Medicare PT Recommendations:  Inpatient [place] / Referral to community resources:  [place]  Patient/Family's Response to care:  Pt alert and oriented x 4. Pt expressed feeling anxious as she thought she would be able to go to [facility] because the MD has been stating that that is the plan. Pt daughter familiar with hospital setting and aware that SNF rehab needed to be explored if inpatient rehab remains full. Pt and pt family appreciative of CSW visit and support.   Patient/Family's Understanding of and Emotional Response to Diagnosis, Current Treatment, and Prognosis:  Pt and pt family knowledgeable about pt diagnosis and treatment plan. Pt and pt family very hopeful that CIR will have a bed available, but know that SNF bed will need to be chosen if CIR not available when pt medically ready.  Emotional Assessment Appearance:  Appears younger than stated age Attitude/Demeanor/Rapport:  Other (pt appropriate, but expressed anxiousness) Affect (typically observed):  Anxious Orientation:  Oriented to Self, Oriented to Place, Oriented to  Time, Oriented to Situation Alcohol / Substance use:  Not Applicable Psych involvement (Current and /or in  the community):  No (Comment)  Discharge Needs  Concerns to be addressed:  Discharge Planning Concerns Readmission within the last 30 days:  No Current discharge risk:  Lives alone Barriers to Discharge:  Continued Medical Work up   Ladell Pier, LCSW 06/13/2015, 5:21 PM  Coverage for Werner Lean, [place]

## 2015-06-13 NOTE — Progress Notes (Signed)
I received a call from pt's Daughter, [name], requesting clarification of admission to inpt rehab. I discussed that pt has just gotten up with therapy at 1125 this morning and that my rehab MD has not completed his consult yet. Currently I do not expect a bed to be available Monday and that I stated that ortho team expecting pt to be ready for d/c on Monday. I recommended to Tammy that other inpt rehab facility or SNF be pursued in case no bed available Monday or inpt rehab not recommended by rehab MD. [phone]

## 2015-06-14 LAB — BASIC METABOLIC PANEL
ANION GAP: 9 (ref 5–15)
BUN: 14 mg/dL (ref 6–20)
CALCIUM: 9.3 mg/dL (ref 8.9–10.3)
CHLORIDE: 107 mmol/L (ref 101–111)
CO2: 27 mmol/L (ref 22–32)
Creatinine, Ser: 0.6 mg/dL (ref 0.44–1.00)
GFR calc non Af Amer: >60 mL/min (ref >60)
Glucose, Bld: 144 mg/dL — ABNORMAL HIGH (ref 65–99)
POTASSIUM: 4.2 mmol/L (ref 3.5–5.1)
Sodium: 143 mmol/L (ref 135–145)

## 2015-06-14 LAB — CBC
HEMATOCRIT: 28.1 % — AB (ref 36.0–46.0)
Hemoglobin: 9.3 g/dL — ABNORMAL LOW (ref 12.0–15.0)
MCH: 31 pg (ref 26.0–34.0)
MCHC: 33.1 g/dL (ref 30.0–36.0)
MCV: 93.7 fL (ref 78.0–100.0)
Platelets: 297 10*3/uL (ref 150–400)
RBC: 3 MIL/uL — ABNORMAL LOW (ref 3.87–5.11)
RDW: 12.9 % (ref 11.5–15.5)
WBC: 11.4 10*3/uL — ABNORMAL HIGH (ref 4.0–10.5)

## 2015-06-14 LAB — PROTIME-INR
INR: 1.2 (ref 0.00–1.49)
Prothrombin Time: 15.3 seconds — ABNORMAL HIGH (ref 11.6–15.2)

## 2015-06-14 LAB — GLUCOSE, CAPILLARY: GLUCOSE-CAPILLARY: 121 mg/dL — AB (ref 65–99)

## 2015-06-14 MED ORDER — ENOXAPARIN SODIUM 30 MG/0.3ML ~~LOC~~ SOLN
30.0000 mg | SUBCUTANEOUS | Status: DC
  Administered 2015-06-14 – 2015-06-16 (×3): 30 mg via SUBCUTANEOUS
  Filled 2015-06-14 (×4): qty 0.3

## 2015-06-14 MED ORDER — WARFARIN SODIUM 2.5 MG PO TABS
2.5000 mg | ORAL_TABLET | Freq: Once | ORAL | Status: AC
  Administered 2015-06-14: 2.5 mg via ORAL
  Filled 2015-06-14: qty 1

## 2015-06-14 NOTE — Progress Notes (Signed)
   Subjective: 2 Days Post-Op Procedure(s) (LRB): TOTAL KNEE BILATERAL (Bilateral)  Pt c/o mild pain to right knee, no feeling currently in left knee due to epidural Otherwise doing fairly well Interested in starting therapy Patient reports pain as mild.  Objective:   VITALS:   Filed Vitals:   06/14/15 0455  BP: 150/65  Pulse: 75  Temp: 97.7 F (36.5 C)  Resp: 14    Bilateral knee dressing intact nv intact distally No rashes edema or drainage  LABS  Recent Labs  06/13/15 0421 06/14/15 0446  HGB 9.2* 9.3*  HCT 28.7* 28.1*  WBC 12.8* 11.4*  PLT 293 297     Recent Labs  06/13/15 0421 06/14/15 0446  NA 139 143  K 3.8 4.2  BUN 19 14  CREATININE 0.72 0.60  GLUCOSE 175* 144*     Assessment/Plan: 2 Days Post-Op Procedure(s) (LRB): TOTAL KNEE BILATERAL (Bilateral) Anesthesia to pull epidural this morning Foley out at 2pm  Begin lovenox later tonight Therapy as able Pain management Plan for rehab placement on Monday    Brad Javius Sylla, MPAS, PA-C  06/14/2015, 7:44 AM

## 2015-06-14 NOTE — Progress Notes (Signed)
Physical Therapy Treatment Patient Details Name: Renee [name] MRN: [id number] DOB: 1946-03-16 Today's Date: 06/14/2015    History of Present Illness B TKA    PT Comments    Pt now with significant increase in pain as epidural has worn off. She now is not able to perform SLR on RLE, and tolerates less flexion AAROM B knees. She performed B TKA exercises with min A. She declined ambulation due to pain and lethargy from pain medication.    Follow Up Recommendations  CIR;Supervision/Assistance - 24 hour     Equipment Recommendations  None recommended by PT    Recommendations for Other Services OT consult     Precautions / Restrictions Precautions Precautions: Knee Precaution Comments: due to epidural removal, now not able to do SLR on either leg Required Braces or Orthoses: Knee Immobilizer - Right;Knee Immobilizer - Left Restrictions Weight Bearing Restrictions: No Other Position/Activity Restrictions: WBAT    Mobility  Bed Mobility              Transfers               Ambulation/Gait           Stairs            Wheelchair Mobility    Modified Rankin (Stroke Patients Only)       Balance     Sitting balance-Leahy Scale: Good       Standing balance-Leahy Scale: Poor                      Cognition Arousal/Alertness: Suspect due to medications;Lethargic Behavior During Therapy: WFL for tasks assessed/performed Overall Cognitive Status: Within Functional Limits for tasks assessed                      Exercises Total Joint Exercises Ankle Circles/Pumps: AROM;Both;10 reps;Supine Quad Sets: AROM;Both;10 reps;Supine Short Arc Quad: AROM;AAROM;Both;Supine;20 reps (AROM RLE, AAROM LLE) Heel Slides: AAROM;Both;Supine;20 reps Hip ABduction/ADduction: AAROM;Both;20 reps;Supine Straight Leg Raises: AROM;AAROM;Both;10 reps;Supine (AROM RLE, AAROM LLE) Goniometric ROM: R knee 60* AAROM, L knee 50* AAROM    General  Comments        Pertinent Vitals/Pain Pain Score: 8  Pain Location: B knees with exercise Pain Descriptors / Indicators: Sore Pain Intervention(s): Premedicated before session;Monitored during session;Limited activity within patient's tolerance (pt refused ice)    Home Living                      Prior Function            PT Goals (current goals can now be found in the care plan section) Acute Rehab PT Goals Patient Stated Goal: to walk at [facility], to dance PT Goal Formulation: With patient/family Time For Goal Achievement: 06/27/15 Potential to Achieve Goals: Good Progress towards PT goals: Progressing toward goals    Frequency  7X/week    PT Plan Current plan remains appropriate    Co-evaluation             End of Session Equipment Utilized During Treatment: Gait belt;Left knee immobilizer Activity Tolerance: Patient limited by pain Patient left: with call bell/phone within reach;with family/visitor present;in bed     Time: 1210-1225 PT Time Calculation (min) (ACUTE ONLY): 15 min  Charges:  $Gait Training: 8-22 mins $Therapeutic Exercise: 8-22 mins                    G Codes:  Blondell Reveal Kistler 06/14/2015, 12:33 PM [phone]

## 2015-06-14 NOTE — Progress Notes (Signed)
[place] for Warfarin Indication: VTE prophylaxis  Allergies  Allergen Reactions  . Other Rash    Metal   . Codeine Nausea And Vomiting  . Metformin Nausea And Vomiting  . Simvastatin Swelling and Rash   Patient Measurements: Height: 5\' 4"  (162.6 cm) Weight: 156 lb (70.761 kg) IBW/kg (Calculated) : 54.7  Vital Signs: Temp: 97.7 F (36.5 C) (06/18 0455) Temp Source: Oral (06/18 0455) BP: 150/65 mmHg (06/18 0455) Pulse Rate: 75 (06/18 0455)  Labs:  Recent Labs  06/13/15 0421 06/14/15 0446  HGB 9.2* 9.3*  HCT 28.7* 28.1*  PLT 293 297  LABPROT 14.1 15.3*  INR 1.07 1.20  CREATININE 0.72 0.60    Estimated Creatinine Clearance: 63.1 mL/min (by C-G formula based on Cr of 0.6).  Medical History: Past Medical History  Diagnosis Date  . Hyperlipidemia   . Hypothyroidism   . CAD (coronary artery disease)     non-obstructive. cath 11/11  . Hypertension   . History of phobia     clastrophobia  . Diabetes mellitus     borderline diabetic - diet controlled   . Kidney stones     hx of   . Urinary tract infection     hx of   . Arthritis   . Psoriasis    Medications:  Scheduled:  . docusate sodium  100 mg Oral BID  . enoxaparin (LOVENOX) injection  30 mg Subcutaneous Q24H  . gemfibrozil  600 mg Oral QAC breakfast  . levothyroxine  75 mcg Oral QAC breakfast  . lisinopril  20 mg Oral Daily  . Warfarin - Pharmacist Dosing Inpatient   Does not apply q1800    Assessment: 12 yoF s/p bilateral TKA. Warfarin ordered for VTE prophylaxis, Pharmacy to dose. Not on any anticoagulant PTA.  Baseline INR on 6/10 = 0.97, no anticoagulants prior to admit  Ropivacaine epidural anesthesia; first dose Warfarin  given on 6/17 @ 10am  Epidural removed on 6/18 at ~0800  Today, 06/14/2015: POD#2 - INR is sub-therapeutic but trending up after first dose of coumadin 2.5mg  dose given on 6/17 - INR remains WNL - CBC low but stable - no  bleeding documented - Drug interactions:  Gemfibrozil often increases INR response to warfarin - Diet: eating 100% of meals   Goal Range:  INR 2-3   Plan:  - warfarin 2.5mg  PO x1 today - lovenox 30mg  SQ q24h to start at 2000 tonight (per MD) - Daily PT/INR while inpatient - monitor for s/s bleeding  Dia Sitter, PharmD, BCPS 06/14/2015 10:31 AM

## 2015-06-14 NOTE — Progress Notes (Signed)
Transferred to 5w, 1528. Condition stable, no changes in assessment. Yacine Droz, [employer]

## 2015-06-14 NOTE — Progress Notes (Signed)
Physical Therapy Treatment Patient Details Name: Renee [name] MRN: [id number] DOB: 1946/11/25 Today's Date: 06/14/2015    History of Present Illness B TKA    PT Comments    Epidural removed this morning prior to PT session. Pt ambulated 75' with RW and min/guard assist. Performed B TKA exercises, + SLR RLE. Pt progressing quite well with mobility, minimal pain at this point, however epidural hasn't yet worn off.    Follow Up Recommendations  CIR;Supervision/Assistance - 24 hour     Equipment Recommendations  None recommended by PT    Recommendations for Other Services OT consult     Precautions / Restrictions Precautions Precautions: Knee Precaution Comments: pt was able to perform R SLR, epidural removed this morning Required Braces or Orthoses: Knee Immobilizer - Left Restrictions Weight Bearing Restrictions: No Other Position/Activity Restrictions: WBAT    Mobility  Bed Mobility Overal bed mobility: Needs Assistance Bed Mobility: Sit to Supine       Sit to supine: Min assist   General bed mobility comments: min A for LLE, verbal cues for technique  Transfers Overall transfer level: Needs assistance Equipment used: Rolling walker (2 wheeled) Transfers: Sit to/from Stand Sit to Stand: Min assist;From elevated surface         General transfer comment: assist to rise from bed, cues for hand/leg placement  Ambulation/Gait   Ambulation Distance (Feet): 80 Feet Assistive device: Rolling walker (2 wheeled) Gait Pattern/deviations: Step-through pattern;Decreased step length - left  Min/guard assist for safety   Gait velocity interpretation: Below normal speed for age/gender General Gait Details: steady with RW, verbal cues for sequencing, KI on LLE, verbal cues to lift head   Stairs            Wheelchair Mobility    Modified Rankin (Stroke Patients Only)       Balance     Sitting balance-Leahy Scale: Good       Standing balance-Leahy  Scale: Poor                      Cognition Arousal/Alertness: Awake/alert Behavior During Therapy: WFL for tasks assessed/performed Overall Cognitive Status: Within Functional Limits for tasks assessed                      Exercises Total Joint Exercises Ankle Circles/Pumps: AROM;Both;10 reps;Supine Quad Sets: AROM;Both;10 reps;Supine Short Arc Quad: AROM;AAROM;Both;Supine;20 reps (AROM RLE, AAROM LLE) Heel Slides: AAROM;Both;Supine;15 reps Hip ABduction/ADduction: AAROM;Both;10 reps Straight Leg Raises: AROM;AAROM;Both;10 reps;Supine (AROM RLE, AAROM LLE) Goniometric ROM: 70* B knee flexion AAROM -epidural removed 25 minutes prior to exercises    General Comments        Pertinent Vitals/Pain Pain Score: 1  Pain Location: B knees with walking Pain Descriptors / Indicators: Dull Pain Intervention(s): Monitored during session;Premedicated before session;Ice applied    Home Living                      Prior Function            PT Goals (current goals can now be found in the care plan section) Acute Rehab PT Goals Patient Stated Goal: to walk at [facility] to dance PT Goal Formulation: With patient/family Time For Goal Achievement: 06/27/15 Potential to Achieve Goals: Good Progress towards PT goals: Progressing toward goals    Frequency  7X/week    PT Plan Current plan remains appropriate    Co-evaluation  End of Session Equipment Utilized During Treatment: Gait belt;Left knee immobilizer Activity Tolerance: Patient tolerated treatment well Patient left: with call bell/phone within reach;with family/visitor present;in chair     Time: 2023-3435 PT Time Calculation (min) (ACUTE ONLY): 44 min  Charges:  $Gait Training: 8-22 mins $Therapeutic Exercise: 23-37 mins                    G Codes:      Philomena Doheny 06/14/2015, 8:54 AM [phone]

## 2015-06-14 NOTE — Anesthesia Postprocedure Evaluation (Signed)
  Anesthesia Post-op Note  [name]  Procedure(s) Performed: * No procedures listed *  Patient Location: PACU and Rm 1601  Anesthesia Type:Epidural  Level of Consciousness: awake, alert  and oriented  Airway and Oxygen Therapy: Patient Spontanous Breathing and Patient connected to nasal cannula oxygen  Post-op Pain: mild  Post-op Assessment: Post-op Vital signs reviewed, Patient's Cardiovascular Status Stable, Respiratory Function Stable, Patent Airway, No signs of Nausea or vomiting, Adequate PO intake, Pain level controlled, No headache, No backache and Patient able to bend at knees. Epidural catheter removed tip intact. Band Aid placed over insertion site which was clean and dry with no surrounding erythema, edema or tenderness. LLE Motor Response: Responds to commands LLE Sensation: Decreased RLE Motor Response: Responds to commands RLE Sensation: Decreased L Sensory Level: S1-Sole of foot, small toes R Sensory Level: S1-Sole of foot, small toes  Post-op Vital Signs: Reviewed and stable  Last Vitals:  Filed Vitals:   06/14/15 0455  BP: 150/65  Pulse: 75  Temp: 36.5 C  Resp: 14    Complications: No apparent anesthesia complications

## 2015-06-15 LAB — CBC
HCT: 30.6 % — ABNORMAL LOW (ref 36.0–46.0)
Hemoglobin: 9.8 g/dL — ABNORMAL LOW (ref 12.0–15.0)
MCH: 30.3 pg (ref 26.0–34.0)
MCHC: 32 g/dL (ref 30.0–36.0)
MCV: 94.7 fL (ref 78.0–100.0)
PLATELETS: 377 10*3/uL (ref 150–400)
RBC: 3.23 MIL/uL — ABNORMAL LOW (ref 3.87–5.11)
RDW: 13.1 % (ref 11.5–15.5)
WBC: 11.8 10*3/uL — ABNORMAL HIGH (ref 4.0–10.5)

## 2015-06-15 LAB — BASIC METABOLIC PANEL
Anion gap: 14 (ref 5–15)
BUN: 13 mg/dL (ref 6–20)
CO2: 27 mmol/L (ref 22–32)
Calcium: 9.2 mg/dL (ref 8.9–10.3)
Chloride: 98 mmol/L — ABNORMAL LOW (ref 101–111)
Creatinine, Ser: 0.81 mg/dL (ref 0.44–1.00)
GFR calc Af Amer: >60 mL/min (ref >60)
Glucose, Bld: 175 mg/dL — ABNORMAL HIGH (ref 65–99)
Potassium: 3.5 mmol/L (ref 3.5–5.1)
Sodium: 139 mmol/L (ref 135–145)

## 2015-06-15 LAB — PROTIME-INR
INR: 1.14 (ref 0.00–1.49)
PROTHROMBIN TIME: 14.8 s (ref 11.6–15.2)

## 2015-06-15 MED ORDER — WARFARIN SODIUM 5 MG PO TABS
5.0000 mg | ORAL_TABLET | Freq: Once | ORAL | Status: AC
  Administered 2015-06-15: 5 mg via ORAL
  Filled 2015-06-15: qty 1

## 2015-06-15 NOTE — Progress Notes (Signed)
Physical Therapy Treatment Patient Details Name: Renee [name] MRN: [id number] DOB: 25-Dec-1946 Today's Date: 06/15/2015    History of Present Illness B TKA    PT Comments    POD # 3 am session.  Applied KI L LE then assisted pt OOB to [facility].  Assisted with hygiene then amb in hallway.  Returned to room and performed B LE TKR TE's followed by ICE. Pt required increased time with TE's for rest periods to control pain level.    Follow Up Recommendations  CIR     Equipment Recommendations       Recommendations for Other Services       Precautions / Restrictions Precautions Precautions: Knee Precaution Comments: KI on L LE this am Knee Immobilizer - Left: Discontinue once straight leg raise with < 10 degree lag Restrictions Weight Bearing Restrictions: No Other Position/Activity Restrictions: WBAT    Mobility  Bed Mobility Overal bed mobility: Needs Assistance Bed Mobility: Supine to Sit     Supine to sit: Min assist     General bed mobility comments: B LE and increased time  Transfers Overall transfer level: Needs assistance Equipment used: Rolling walker (2 wheeled) Transfers: Sit to/from Stand Sit to Stand: Min assist         General transfer comment: assist to rise from bed, cues for hand/leg placement plus increased time  Ambulation/Gait Ambulation/Gait assistance: Min guard;Min assist Ambulation Distance (Feet): 75 Feet Assistive device: Rolling walker (2 wheeled) Gait Pattern/deviations: Step-through pattern;Decreased stride length Gait velocity: decreased   General Gait Details: steady with RW, verbal cues for sequencing, KI on LLE, verbal cues to lift head plus increased time (slow)   Stairs            Wheelchair Mobility    Modified Rankin (Stroke Patients Only)       Balance                                    Cognition Arousal/Alertness: Awake/alert Behavior During Therapy: WFL for tasks assessed/performed Overall  Cognitive Status: Within Functional Limits for tasks assessed                      Exercises    B LE's Total Knee Replacement TE's 10 reps B LE ankle pumps 10 reps towel squeezes 10 reps knee presses 10 reps heel slides AAROM 10 reps SLR's AAROM 10 reps ABD AAROM Followed by ICE    General Comments        Pertinent Vitals/Pain Pain Assessment: 0-10 Pain Score: 8  Pain Location: B knees L > R Pain Descriptors / Indicators: Burning;Sore Pain Intervention(s): Monitored during session;Premedicated before session;Repositioned;Ice applied    Home Living                      Prior Function            PT Goals (current goals can now be found in the care plan section) Progress towards PT goals: Progressing toward goals    Frequency  7X/week    PT Plan Current plan remains appropriate    Co-evaluation             End of Session Equipment Utilized During Treatment: Gait belt;Left knee immobilizer Activity Tolerance: Patient limited by pain Patient left: in chair;with call bell/phone within reach;with family/visitor present     Time: 5035-4656 PT Time Calculation (min) (  ACUTE ONLY): 40 min  Charges:  $Gait Training: 8-22 mins $Therapeutic Exercise: 8-22 mins $Therapeutic Activity: 8-22 mins                    G Codes:      Rica Koyanagi  PTA [facility]  Acute  Rehab Pager      [phone]

## 2015-06-15 NOTE — Progress Notes (Signed)
Physical Therapy Treatment Patient Details Name: Renee [name] MRN: [id number] DOB: 1946/12/17 Today's Date: 06/15/2015    History of Present Illness B TKA    PT Comments    POD # 3 pm session.  Assisted pt OOB to amb in hallway a second time with out B KI's this time.  Assisted on/off BSC with min VC's on proper tech and hand placment as well as B LE advancement L > R.  Pt expressed concern that the ICE packs were too heavy and too cold.  Did not tolerate very long.   Follow Up Recommendations  CIR     Equipment Recommendations       Recommendations for Other Services       Precautions / Restrictions Precautions Precautions: Knee Precaution Comments: KI on L LE this am Knee Immobilizer - Left: Discontinue once straight leg raise with < 10 degree lag Restrictions Weight Bearing Restrictions: No Other Position/Activity Restrictions: WBAT    Mobility  Bed Mobility Overal bed mobility: Needs Assistance Bed Mobility: Supine to Sit     Supine to sit: Min assist     General bed mobility comments: B LE and increased time  Transfers Overall transfer level: Needs assistance Equipment used: Rolling walker (2 wheeled) Transfers: Sit to/from Stand Sit to Stand: Min assist         General transfer comment: assist to rise from bed, cues for hand/leg placement plus increased time  Ambulation/Gait Ambulation/Gait assistance: Min guard;Min assist Ambulation Distance (Feet): 75 Feet Assistive device: Rolling walker (2 wheeled) Gait Pattern/deviations: Step-through pattern;Decreased stride length Gait velocity: decreased   General Gait Details: steady with RW, verbal cues for sequencing, KI on LLE, verbal cues to lift head plus increased time (slow)   Stairs            Wheelchair Mobility    Modified Rankin (Stroke Patients Only)       Balance                                    Cognition Arousal/Alertness: Awake/alert Behavior During  Therapy: WFL for tasks assessed/performed Overall Cognitive Status: Within Functional Limits for tasks assessed                      Exercises      General Comments        Pertinent Vitals/Pain Pain Assessment: 0-10 Pain Score: 8  Pain Location: B knees L > R Pain Descriptors / Indicators: Burning;Sore Pain Intervention(s): Monitored during session;Premedicated before session;Repositioned;Ice applied    Home Living                      Prior Function            PT Goals (current goals can now be found in the care plan section) Progress towards PT goals: Progressing toward goals    Frequency  7X/week    PT Plan Current plan remains appropriate    Co-evaluation             End of Session Equipment Utilized During Treatment: Gait belt;Left knee immobilizer Activity Tolerance: Patient limited by pain Patient left: in chair;with call bell/phone within reach;with family/visitor present     Time: 1342-1400 PT Time Calculation (min) (ACUTE ONLY): 18 min  Charges:  $Gait Training: 8-22 mins  G Codes:      Rica Koyanagi  PTA [facility]  Acute  Rehab Pager      [phone]

## 2015-06-15 NOTE — Progress Notes (Signed)
[place] for Warfarin Indication: VTE prophylaxis  Allergies  Allergen Reactions  . Other Rash    Metal   . Codeine Nausea And Vomiting  . Metformin Nausea And Vomiting  . Simvastatin Swelling and Rash   Patient Measurements: Height: 5\' 4"  (162.6 cm) Weight: 156 lb (70.761 kg) IBW/kg (Calculated) : 54.7  Vital Signs: Temp: 98.3 F (36.8 C) (06/19 0604) Temp Source: Oral (06/19 0604) BP: 138/54 mmHg (06/19 0604) Pulse Rate: 88 (06/19 0604)  Labs:  Recent Labs  06/13/15 0421 06/14/15 0446 06/15/15 0550  HGB 9.2* 9.3* 9.8*  HCT 28.7* 28.1* 30.6*  PLT 293 297 377  LABPROT 14.1 15.3* 14.8  INR 1.07 1.20 1.14  CREATININE 0.72 0.60 0.81    Estimated Creatinine Clearance: 62.3 mL/min (by C-G formula based on Cr of 0.81).  Medical History: Past Medical History  Diagnosis Date  . Hyperlipidemia   . Hypothyroidism   . CAD (coronary artery disease)     non-obstructive. cath 11/11  . Hypertension   . History of phobia     clastrophobia  . Diabetes mellitus     borderline diabetic - diet controlled   . Kidney stones     hx of   . Urinary tract infection     hx of   . Arthritis   . Psoriasis    Medications:  Scheduled:  . docusate sodium  100 mg Oral BID  . enoxaparin (LOVENOX) injection  30 mg Subcutaneous Q24H  . gemfibrozil  600 mg Oral QAC breakfast  . levothyroxine  75 mcg Oral QAC breakfast  . lisinopril  20 mg Oral Daily  . Warfarin - Pharmacist Dosing Inpatient   Does not apply q1800    Assessment: 59 yoF s/p bilateral TKA. Warfarin ordered for VTE prophylaxis, Pharmacy to dose. Not on any anticoagulant PTA.  Baseline INR on 6/10 = 0.97, no anticoagulants prior to admit  Ropivacaine epidural anesthesia; first dose Warfarin  given on 6/17 @ 10am  Epidural removed on 6/18 at ~0800  Today, 06/15/2015: POD#3 - INR = 1.14: sub-therapeutic; trended down overnight (epidural out 6/18) - CBC low but stable - no  bleeding documented - Drug interactions:  Gemfibrozil often increases INR response to warfarin - Diet: nothing charted 6/18  Goal Range:  INR 2-3   Plan:  - warfarin  5mg  PO x1 today - lovenox 30mg  SQ q24h to started 6/19PM (stop when INR >= 2) - Daily PT/INR while inpatient - monitor for s/s bleeding  Doreene Eland, PharmD, BCPS.   Pager: [phone] 06/15/2015 9:19 AM

## 2015-06-15 NOTE — Progress Notes (Signed)
Subjective: 3 Days Post-Op Procedure(s) (LRB): TOTAL KNEE BILATERAL (Bilateral) Patient reports pain as 3 on 0-10 scale.    Objective: Vital signs in last 24 hours: Temp:  [98.3 F (36.8 C)-98.8 F (37.1 C)] 98.3 F (36.8 C) (06/19 0604) Pulse Rate:  [76-89] 88 (06/19 0604) Resp:  [16] 16 (06/19 0604) BP: (138-153)/(49-61) 138/54 mmHg (06/19 0604) SpO2:  [92 %-95 %] 92 % (06/19 0604)  Intake/Output from previous day: 06/18 0701 - 06/19 0700 In: 960 [P.O.:960] Out: 650 [Urine:650] Intake/Output this shift:     Recent Labs  06/13/15 0421 06/14/15 0446 06/15/15 0550  HGB 9.2* 9.3* 9.8*    Recent Labs  06/14/15 0446 06/15/15 0550  WBC 11.4* 11.8*  RBC 3.00* 3.23*  HCT 28.1* 30.6*  PLT 297 377    Recent Labs  06/14/15 0446 06/15/15 0550  NA 143 139  K 4.2 3.5  CL 107 98*  CO2 27 27  BUN 14 13  CREATININE 0.60 0.81  GLUCOSE 144* 175*  CALCIUM 9.3 9.2    Recent Labs  06/14/15 0446 06/15/15 0550  INR 1.20 1.14    Neurologically intact Intact pulses distally Dorsiflexion/Plantar flexion intact Incision: dressing C/D/I Compartment soft  Assessment/Plan: 3 Days Post-Op Procedure(s) (LRB): TOTAL KNEE BILATERAL (Bilateral) Advance diet Up with therapy Plan for discharge tomorrow  Luv Mish C 06/15/2015, 8:27 AM

## 2015-06-16 LAB — CBC
HCT: 29.1 % — ABNORMAL LOW (ref 36.0–46.0)
Hemoglobin: 9.4 g/dL — ABNORMAL LOW (ref 12.0–15.0)
MCH: 30.2 pg (ref 26.0–34.0)
MCHC: 32.3 g/dL (ref 30.0–36.0)
MCV: 93.6 fL (ref 78.0–100.0)
PLATELETS: 346 10*3/uL (ref 150–400)
RBC: 3.11 MIL/uL — AB (ref 3.87–5.11)
RDW: 13 % (ref 11.5–15.5)
WBC: 9.6 10*3/uL (ref 4.0–10.5)

## 2015-06-16 LAB — PROTIME-INR
INR: 1.24 (ref 0.00–1.49)
Prothrombin Time: 15.8 seconds — ABNORMAL HIGH (ref 11.6–15.2)

## 2015-06-16 MED ORDER — WARFARIN SODIUM 5 MG PO TABS: 5.0000 mg | ORAL_TABLET | Freq: Every day | ORAL | Status: DC

## 2015-06-16 MED ORDER — LISINOPRIL 20 MG PO TABS
20.0000 mg | ORAL_TABLET | Freq: Every day | ORAL | Status: DC
  Filled 2015-06-16: qty 1

## 2015-06-16 MED ORDER — POLYETHYLENE GLYCOL 3350 17 G PO PACK: 17.0000 g | PACK | Freq: Every day | ORAL | Status: DC | PRN

## 2015-06-16 MED ORDER — WARFARIN SODIUM 5 MG PO TABS
5.0000 mg | ORAL_TABLET | Freq: Once | ORAL | Status: AC
  Administered 2015-06-16: 5 mg via ORAL
  Filled 2015-06-16: qty 1

## 2015-06-16 MED ORDER — DOCUSATE SODIUM 100 MG PO CAPS: 100.0000 mg | ORAL_CAPSULE | Freq: Two times a day (BID) | ORAL | Status: DC

## 2015-06-16 MED ORDER — TRAMADOL HCL 50 MG PO TABS: 50.0000 mg | ORAL_TABLET | Freq: Four times a day (QID) | ORAL | Status: DC | PRN

## 2015-06-16 MED ORDER — HYDROMORPHONE HCL 2 MG PO TABS: 2.0000 mg | ORAL_TABLET | ORAL | Status: DC | PRN

## 2015-06-16 MED ORDER — LEVOTHYROXINE SODIUM 75 MCG PO TABS
75.0000 ug | ORAL_TABLET | Freq: Every day | ORAL | Status: DC
  Administered 2015-06-17: 75 ug via ORAL
  Filled 2015-06-16 (×2): qty 1

## 2015-06-16 MED ORDER — ALPRAZOLAM 1 MG PO TABS: 1.0000 mg | ORAL_TABLET | Freq: Every day | ORAL | Status: DC | PRN

## 2015-06-16 MED ORDER — GEMFIBROZIL 600 MG PO TABS
600.0000 mg | ORAL_TABLET | Freq: Every day | ORAL | Status: DC
  Administered 2015-06-17: 600 mg via ORAL
  Filled 2015-06-16 (×2): qty 1

## 2015-06-16 MED ORDER — ENOXAPARIN SODIUM 30 MG/0.3ML ~~LOC~~ SOLN: 30.0000 mg | SUBCUTANEOUS | Status: DC

## 2015-06-16 MED ORDER — METHOCARBAMOL 500 MG PO TABS: 500.0000 mg | ORAL_TABLET | Freq: Four times a day (QID) | ORAL | Status: DC | PRN

## 2015-06-16 MED ORDER — ONDANSETRON HCL 4 MG PO TABS: 4.0000 mg | ORAL_TABLET | Freq: Four times a day (QID) | ORAL | Status: DC | PRN

## 2015-06-16 MED ORDER — METOCLOPRAMIDE HCL 5 MG PO TABS: 5.0000 mg | ORAL_TABLET | Freq: Three times a day (TID) | ORAL | Status: DC | PRN

## 2015-06-16 MED ORDER — BISACODYL 10 MG RE SUPP: 10.0000 mg | Freq: Every day | RECTAL | Status: DC | PRN

## 2015-06-16 NOTE — Progress Notes (Signed)
I contacted SW as well as daughter, [name], by phone. There is not an inpt rehab bed available for this pt today or tomorrow. We will sign off. [phone]

## 2015-06-16 NOTE — Care Management Note (Signed)
Case Management Note  Patient Details  Name: Renee [name] MRN: [id number] Date of Birth: 1946/08/07  Subjective/Objective:             Admitted s/p bilateral knee replacements       Action/Plan: Discharge planning for CIR vs SNF. No availability at [facility], CSW assisting with d/c to SNF.   Expected Discharge Date:                  Expected Discharge Plan:  [place]  In-House Referral:  Clinical Social Work  Discharge planning Services  CM Consult  Post Acute Care Choice:    Choice offered to:  Patient  Status of Service:  Completed, signed off  Medicare Important Message Given:  Yes Date Medicare IM Given:  06/16/15 Medicare IM give by:  Sunday Spillers RN CM  Date Additional Medicare IM Given:    Additional Medicare Important Message give by:     If discussed at [place] of Stay Meetings, dates discussed:    Additional Comments:  Guadalupe Maple, RN 06/16/2015, 12:11 PM

## 2015-06-16 NOTE — Progress Notes (Signed)
Noted pt ambulated 80 feet with supervision with PT this am. Pt not in need of an inpt rehab admission at this level. [phone]

## 2015-06-16 NOTE — Progress Notes (Signed)
Physical Therapy Treatment Patient Details Name: Renee [name] MRN: [id number] DOB: November 28, 1946 Today's Date: 06/16/2015    History of Present Illness B TKA    PT Comments    Pt is progressing well with mobility, she walked 33' with RW and supervision, no LOB, performed B TKA exercises with min A. She is able to do SLR bilaterally. She is ready to DC to SNF from PT standpoint.    Follow Up Recommendations  SNF (no CIR bed available, SNF recommended)     Equipment Recommendations  None recommended by PT    Recommendations for Other Services       Precautions / Restrictions Precautions Precautions: Fall;Knee Restrictions Weight Bearing Restrictions:  (as tolerated) Other Position/Activity Restrictions: WBAT    Mobility  Bed Mobility               General bed mobility comments: NT-up in chair  Transfers Overall transfer level: Needs assistance Equipment used: Rolling walker (2 wheeled) Transfers: Sit to/from Stand Sit to Stand: Min guard         General transfer comment: cues for UE and LE placement, min/guard for safety but no physical assist  Ambulation/Gait Ambulation/Gait assistance: Supervision Ambulation Distance (Feet): 80 Feet Assistive device: Rolling walker (2 wheeled) Gait Pattern/deviations: Step-to pattern;Decreased step length - right;Decreased step length - left;Antalgic Gait velocity: decreased   General Gait Details: steady with RW, verbal cues for sequencing and positioning in RW, no KI on either leg, verbal cues to lift head plus increased time (slow)   Stairs            Wheelchair Mobility    Modified Rankin (Stroke Patients Only)       Balance     Sitting balance-Leahy Scale: Good       Standing balance-Leahy Scale: Poor                      Cognition Arousal/Alertness: Awake/alert Behavior During Therapy: WFL for tasks assessed/performed Overall Cognitive Status: Within Functional Limits for tasks  assessed                      Exercises Total Joint Exercises Ankle Circles/Pumps: AROM;Both;10 reps;Supine Quad Sets: AROM;Both;10 reps;Supine Short Arc Quad: AROM;AAROM;Both;Supine;20 reps (AROM RLE, AAROM LLE) Heel Slides: AAROM;Both;Supine;20 reps Hip ABduction/ADduction: AAROM;Both;20 reps;Supine Straight Leg Raises: AROM;AAROM;Both;10 reps;Supine (AROM RLE, AAROM LLE) Goniometric ROM: R knee 65* AAROM, L knee 55* AAROM    General Comments        Pertinent Vitals/Pain Pain Score: 7  Pain Location: B knees, L more than R Pain Descriptors / Indicators: Sore Pain Intervention(s): Limited activity within patient's tolerance;Monitored during session;Premedicated before session;Ice applied    Home Living                      Prior Function            PT Goals (current goals can now be found in the care plan section) Acute Rehab PT Goals Patient Stated Goal: to walk at [facility], to dance PT Goal Formulation: With patient/family Time For Goal Achievement: 06/27/15 Potential to Achieve Goals: Good Progress towards PT goals: Progressing toward goals    Frequency  7X/week    PT Plan Current plan remains appropriate    Co-evaluation             End of Session Equipment Utilized During Treatment: Gait belt Activity Tolerance: Patient tolerated treatment well Patient left: in  chair;with call bell/phone within reach;with family/visitor present     Time: 1028-1108 PT Time Calculation (min) (ACUTE ONLY): 40 min  Charges:  $Gait Training: 8-22 mins $Therapeutic Exercise: 23-37 mins                    G Codes:      Philomena Doheny 06/16/2015, 11:16 AM [phone]

## 2015-06-16 NOTE — Progress Notes (Signed)
CSW spoke to [facility] [place] ( CIR ) this am. She will know by 10:00 am if there will be a bed available for pt today.   Werner Lean LCSW [phone]

## 2015-06-16 NOTE — Discharge Summary (Addendum)
Physician Discharge Summary   Patient ID: Renee [name] MRN: [id number] DOB/AGE: 01-24-1946 69 y.o.  Admit date: 06/12/2015 Discharge date: 06/17/2015  Primary Diagnosis: Osteoarthritis Bilateral knee(s)  Admission Diagnoses:  Past Medical History  Diagnosis Date  . Hyperlipidemia   . Hypothyroidism   . CAD (coronary artery disease)     non-obstructive. cath 11/11  . Hypertension   . History of phobia     clastrophobia  . Diabetes mellitus     borderline diabetic - diet controlled   . Kidney stones     hx of   . Urinary tract infection     hx of   . Arthritis   . Psoriasis    Discharge Diagnoses:   Principal Problem:   OA (osteoarthritis) of knee  Estimated body mass index is 26.76 kg/(m^2) as calculated from the following:   Height as of this encounter: $RemoveBeforeD'5\' 4"'[id number]$  (1.626 m).   Weight as of this encounter: 70.761 kg (156 lb).  Procedure:  Procedure(s) (LRB): TOTAL KNEE BILATERAL (Bilateral)   Consults: [facility]  HPI: Renee [name] is a 69 y.o. year old female with end stage OA of both knees with progressively worsening pain and dysfunction. She has constant pain, with activity and at rest and significant functional deficits with difficulties even with ADLs. She has had extensive non-op management including analgesics, injections of cortisone, and home exercise program, but remains in significant pain with significant dysfunction. We discussed replacing both knees in the same setting versus one at a time including procedure, risks, potential complications, rehab course, and pros and cons associated with each and the patient elects to do both knees at the same time. She presents now for bilateral Total Knee Arthroplasty.   Laboratory Data: Admission on 06/12/2015  Component Date Value Ref Range Status  . Glucose-Capillary 06/12/2015 128* 65 - 99 mg/dL Final  . WBC 06/13/2015 12.8* 4.0 - 10.5 K/uL Final  . RBC 06/13/2015 3.13* 3.87 - 5.11 MIL/uL Final  .  Hemoglobin 06/13/2015 9.2* 12.0 - 15.0 g/dL Final  . HCT 06/13/2015 28.7* 36.0 - 46.0 % Final  . MCV 06/13/2015 91.7  78.0 - 100.0 fL Final  . MCH 06/13/2015 29.4  26.0 - 34.0 pg Final  . MCHC 06/13/2015 32.1  30.0 - 36.0 g/dL Final  . RDW 06/13/2015 12.6  11.5 - 15.5 % Final  . Platelets 06/13/2015 293  150 - 400 K/uL Final  . Sodium 06/13/2015 139  135 - 145 mmol/L Final  . Potassium 06/13/2015 3.8  3.5 - 5.1 mmol/L Final  . Chloride 06/13/2015 108  101 - 111 mmol/L Final  . CO2 06/13/2015 24  22 - 32 mmol/L Final  . Glucose, Bld 06/13/2015 175* 65 - 99 mg/dL Final  . BUN 06/13/2015 19  6 - 20 mg/dL Final  . Creatinine, Ser 06/13/2015 0.72  0.44 - 1.00 mg/dL Final  . Calcium 06/13/2015 8.8* 8.9 - 10.3 mg/dL Final  . GFR calc non Af Amer 06/13/2015 >60  >60 mL/min Final  . GFR calc Af Amer 06/13/2015 >60  >60 mL/min Final   Comment: (NOTE) The eGFR has been calculated using the CKD EPI equation. This calculation has not been validated in all clinical situations. eGFR's persistently <60 mL/min signify possible Chronic Kidney Disease.   . Anion gap 06/13/2015 7  5 - 15 Final  . Prothrombin Time 06/13/2015 14.1  11.6 - 15.2 seconds Final  . INR 06/13/2015 1.07  0.00 - 1.49 Final  . WBC  06/14/2015 11.4* 4.0 - 10.5 K/uL Final  . RBC 06/14/2015 3.00* 3.87 - 5.11 MIL/uL Final  . Hemoglobin 06/14/2015 9.3* 12.0 - 15.0 g/dL Final  . HCT 06/14/2015 28.1* 36.0 - 46.0 % Final  . MCV 06/14/2015 93.7  78.0 - 100.0 fL Final  . MCH 06/14/2015 31.0  26.0 - 34.0 pg Final  . MCHC 06/14/2015 33.1  30.0 - 36.0 g/dL Final  . RDW 06/14/2015 12.9  11.5 - 15.5 % Final  . Platelets 06/14/2015 297  150 - 400 K/uL Final  . Sodium 06/14/2015 143  135 - 145 mmol/L Final  . Potassium 06/14/2015 4.2  3.5 - 5.1 mmol/L Final  . Chloride 06/14/2015 107  101 - 111 mmol/L Final  . CO2 06/14/2015 27  22 - 32 mmol/L Final  . Glucose, Bld 06/14/2015 144* 65 - 99 mg/dL Final  . BUN 06/14/2015 14  6 - 20 mg/dL Final    . Creatinine, Ser 06/14/2015 0.60  0.44 - 1.00 mg/dL Final  . Calcium 06/14/2015 9.3  8.9 - 10.3 mg/dL Final  . GFR calc non Af Amer 06/14/2015 >60  >60 mL/min Final  . GFR calc Af Amer 06/14/2015 >60  >60 mL/min Final   Comment: (NOTE) The eGFR has been calculated using the CKD EPI equation. This calculation has not been validated in all clinical situations. eGFR's persistently <60 mL/min signify possible Chronic Kidney Disease.   . Anion gap 06/14/2015 9  5 - 15 Final  . Prothrombin Time 06/14/2015 15.3* 11.6 - 15.2 seconds Final  . INR 06/14/2015 1.20  0.00 - 1.49 Final  . Glucose-Capillary 06/14/2015 121* 65 - 99 mg/dL Final  . WBC 06/15/2015 11.8* 4.0 - 10.5 K/uL Final  . RBC 06/15/2015 3.23* 3.87 - 5.11 MIL/uL Final  . Hemoglobin 06/15/2015 9.8* 12.0 - 15.0 g/dL Final  . HCT 06/15/2015 30.6* 36.0 - 46.0 % Final  . MCV 06/15/2015 94.7  78.0 - 100.0 fL Final  . MCH 06/15/2015 30.3  26.0 - 34.0 pg Final  . MCHC 06/15/2015 32.0  30.0 - 36.0 g/dL Final  . RDW 06/15/2015 13.1  11.5 - 15.5 % Final  . Platelets 06/15/2015 377  150 - 400 K/uL Final  . Sodium 06/15/2015 139  135 - 145 mmol/L Final  . Potassium 06/15/2015 3.5  3.5 - 5.1 mmol/L Final  . Chloride 06/15/2015 98* 101 - 111 mmol/L Final  . CO2 06/15/2015 27  22 - 32 mmol/L Final  . Glucose, Bld 06/15/2015 175* 65 - 99 mg/dL Final  . BUN 06/15/2015 13  6 - 20 mg/dL Final  . Creatinine, Ser 06/15/2015 0.81  0.44 - 1.00 mg/dL Final  . Calcium 06/15/2015 9.2  8.9 - 10.3 mg/dL Final  . GFR calc non Af Amer 06/15/2015 >60  >60 mL/min Final  . GFR calc Af Amer 06/15/2015 >60  >60 mL/min Final   Comment: (NOTE) The eGFR has been calculated using the CKD EPI equation. This calculation has not been validated in all clinical situations. eGFR's persistently <60 mL/min signify possible Chronic Kidney Disease.   . Anion gap 06/15/2015 14  5 - 15 Final  . Prothrombin Time 06/15/2015 14.8  11.6 - 15.2 seconds Final  . INR 06/15/2015  1.14  0.00 - 1.49 Final  . WBC 06/16/2015 9.6  4.0 - 10.5 K/uL Final  . RBC 06/16/2015 3.11* 3.87 - 5.11 MIL/uL Final  . Hemoglobin 06/16/2015 9.4* 12.0 - 15.0 g/dL Final  . HCT 06/16/2015 29.1* 36.0 - 46.0 %  Final  . MCV 06/16/2015 93.6  78.0 - 100.0 fL Final  . MCH 06/16/2015 30.2  26.0 - 34.0 pg Final  . MCHC 06/16/2015 32.3  30.0 - 36.0 g/dL Final  . RDW 06/16/2015 13.0  11.5 - 15.5 % Final  . Platelets 06/16/2015 346  150 - 400 K/uL Final  . Prothrombin Time 06/16/2015 15.8* 11.6 - 15.2 seconds Final  . INR 06/16/2015 1.24  0.00 - 1.49 Final  . Prothrombin Time 06/17/2015 18.2* 11.6 - 15.2 seconds Final  . INR 06/17/2015 1.50* 0.00 - 1.49 Final  Hospital Outpatient Visit on 06/06/2015  Component Date Value Ref Range Status  . aPTT 06/06/2015 39* 24 - 37 seconds Final   Comment:        IF BASELINE aPTT IS ELEVATED, SUGGEST PATIENT RISK ASSESSMENT BE USED TO DETERMINE APPROPRIATE ANTICOAGULANT THERAPY.   . WBC 06/06/2015 6.0  4.0 - 10.5 K/uL Final  . RBC 06/06/2015 4.17  3.87 - 5.11 MIL/uL Final  . Hemoglobin 06/06/2015 12.6  12.0 - 15.0 g/dL Final  . HCT 06/06/2015 39.6  36.0 - 46.0 % Final  . MCV 06/06/2015 95.0  78.0 - 100.0 fL Final  . MCH 06/06/2015 30.2  26.0 - 34.0 pg Final  . MCHC 06/06/2015 31.8  30.0 - 36.0 g/dL Final  . RDW 06/06/2015 12.9  11.5 - 15.5 % Final  . Platelets 06/06/2015 395  150 - 400 K/uL Final  . Sodium 06/06/2015 140  135 - 145 mmol/L Final  . Potassium 06/06/2015 4.7  3.5 - 5.1 mmol/L Final  . Chloride 06/06/2015 104  101 - 111 mmol/L Final  . CO2 06/06/2015 27  22 - 32 mmol/L Final  . Glucose, Bld 06/06/2015 128* 65 - 99 mg/dL Final  . BUN 06/06/2015 25* 6 - 20 mg/dL Final  . Creatinine, Ser 06/06/2015 0.66  0.44 - 1.00 mg/dL Final  . Calcium 06/06/2015 9.8  8.9 - 10.3 mg/dL Final  . Total Protein 06/06/2015 7.9  6.5 - 8.1 g/dL Final  . Albumin 06/06/2015 4.4  3.5 - 5.0 g/dL Final  . AST 06/06/2015 26  15 - 41 U/L Final  . ALT 06/06/2015 21   14 - 54 U/L Final  . Alkaline Phosphatase 06/06/2015 65  38 - 126 U/L Final  . Total Bilirubin 06/06/2015 0.3  0.3 - 1.2 mg/dL Final  . GFR calc non Af Amer 06/06/2015 >60  >60 mL/min Final  . GFR calc Af Amer 06/06/2015 >60  >60 mL/min Final   Comment: (NOTE) The eGFR has been calculated using the CKD EPI equation. This calculation has not been validated in all clinical situations. eGFR's persistently <60 mL/min signify possible Chronic Kidney Disease.   . Anion gap 06/06/2015 9  5 - 15 Final  . Prothrombin Time 06/06/2015 13.1  11.6 - 15.2 seconds Final  . INR 06/06/2015 0.97  0.00 - 1.49 Final  . ABO/RH(D) 06/06/2015 A POS   Final  . Antibody Screen 06/06/2015 NEG   Final  . Sample Expiration 06/06/2015 06/15/2015   Final  . Color, Urine 06/06/2015 YELLOW  YELLOW Final  . APPearance 06/06/2015 CLEAR  CLEAR Final  . Specific Gravity, Urine 06/06/2015 1.018  1.005 - 1.030 Final  . pH 06/06/2015 5.5  5.0 - 8.0 Final  . Glucose, UA 06/06/2015 NEGATIVE  NEGATIVE mg/dL Final  . Hgb urine dipstick 06/06/2015 TRACE* NEGATIVE Final  . Bilirubin Urine 06/06/2015 NEGATIVE  NEGATIVE Final  . Ketones, ur 06/06/2015 NEGATIVE  NEGATIVE mg/dL Final  .  Protein, ur 06/06/2015 NEGATIVE  NEGATIVE mg/dL Final  . Urobilinogen, UA 06/06/2015 0.2  0.0 - 1.0 mg/dL Final  . Nitrite 06/06/2015 NEGATIVE  NEGATIVE Final  . Leukocytes, UA 06/06/2015 TRACE* NEGATIVE Final  . MRSA, PCR 06/06/2015 NEGATIVE  NEGATIVE Final  . Staphylococcus aureus 06/06/2015 NEGATIVE  NEGATIVE Final   Comment:        The Xpert SA Assay (FDA approved for NASAL specimens in patients over 17 years of age), is one component of a comprehensive surveillance program.  Test performance has been validated by [facility] for patients greater than or equal to 22 year old. It is not intended to diagnose infection nor to guide or monitor treatment.   . Squamous Epithelial / LPF 06/06/2015 RARE  RARE Final  . WBC, UA 06/06/2015 0-2   <3 WBC/hpf Final  . RBC / HPF 06/06/2015 0-2  <3 RBC/hpf Final  . ABO/RH(D) 06/06/2015 A POS   Final     X-Rays:No results found.  EKG: Orders placed or performed in visit on 11/25/14  . EKG 12-Lead     Hospital Course: Patient was admitted to [facility] and taken to the OR and underwent the above stated procedure well without complications.  Patient tolerated the procedure well and was later transferred to the recovery room and then to the orthopaedic floor for postoperative care. Anesthesia was consulted postoperatively to place an epidural in for postoperative pain management. The patient was also given PO and IV analgesics for pain control following their surgery.  They were given 24 hours of postoperative antibiotics and started on DVT prophylaxis in the form of Lovenox and Coumadin after the epidural had been removed.   PT and OT were ordered for total joint protocol.  Discharge planning consulted to help with postop disposition and equipment needs.  Patient had a decent night on the evening of surgery but did not get any rest.  They started to get up OOB with therapy on day one and took a few stesps.  Hemovac drains were pulled without difficulty on day one.  Continued to work with therapy into day two.  Dressings were changed on day two and both incisions were healing well.  The epidural was removed without difficulty by Anesthesia on day two.  By day three, the patient started to show progress with therapy walking about 75 feet.  They continued to receive therapy each day for continued total knee protocol.  The incisions were healing well.  They continued to progress on day four at which time the patient was seen in rounds by Dr. Wynelle Link. She was seen again of day five, 06/17/2015 and it was decided that the patient would be ready for transfer that day, Tuesday 6.21.2016.    Take Coumadin for 4 weeks and then discontinue.  The dose may need to be adjusted based upon the INR.  Please  follow the INR and titrate Coumadin dose for a therapeutic range between 2.0 and 3.0 INR.  After completing the 4 weeks of Coumadin, the patient may stop the Coumadin and resume their 81 mg Aspirin daily.  Continue Lovenox injections until the INR is therapeutic at or greater than 2.0.  When INR reaches the therapeutic level of equal to or greater than 2.0, the patient may discontinue the Lovenox injections.   Diet: Cardiac diet and Diabetic diet Activity:WBAT Follow-up:in 2 weeks Disposition - SNF Discharged Condition: Improving       Discharge Instructions    Call MD / Call  [phone]    Complete by:  As directed   If you experience chest pain or shortness of breath, CALL 911 and be transported to the hospital emergency room.  If you develope a fever above 101 F, pus (white drainage) or increased drainage or redness at the wound, or calf pain, call your surgeon's office.     Change dressing    Complete by:  As directed   Change dressing daily with sterile 4 x 4 inch gauze dressing and apply TED hose. Do not submerge the incision under water.     Constipation Prevention    Complete by:  As directed   Drink plenty of fluids.  Prune juice may be helpful.  You may use a stool softener, such as Colace (over the counter) 100 mg twice a day.  Use MiraLax (over the counter) for constipation as needed.     Diet - low sodium heart healthy    Complete by:  As directed      Discharge instructions    Complete by:  As directed   Pick up stool softner and laxative for home use following surgery while on pain medications. Do not submerge incision under water. Please use good hand washing techniques while changing dressing each day. May shower starting three days after surgery. Please use a clean towel to pat the incision dry following showers. Continue to use ice for pain and swelling after surgery. Do not use any lotions or creams on the incision until instructed by your surgeon.  Postoperative  Constipation Protocol  Constipation - defined medically as fewer than three stools per week and severe constipation as less than one stool per week.  One of the most common issues patients have following surgery is constipation.  Even if you have a regular bowel pattern at home, your normal regimen is likely to be disrupted due to multiple reasons following surgery.  Combination of anesthesia, postoperative narcotics, change in appetite and fluid intake all can affect your bowels.  In order to avoid complications following surgery, here are some recommendations in order to help you during your recovery period.  Colace (docusate) - Pick up an over-the-counter form of Colace or another stool softener and take twice a day as long as you are requiring postoperative pain medications.  Take with a full glass of water daily.  If you experience loose stools or diarrhea, hold the colace until you stool forms back up.  If your symptoms do not get better within 1 week or if they get worse, check with your doctor.  Dulcolax (bisacodyl) - Pick up over-the-counter and take as directed by the product packaging as needed to assist with the movement of your bowels.  Take with a full glass of water.  Use this product as needed if not relieved by Colace only.   MiraLax (polyethylene glycol) - Pick up over-the-counter to have on hand.  MiraLax is a solution that will increase the amount of water in your bowels to assist with bowel movements.  Take as directed and can mix with a glass of water, juice, soda, coffee, or tea.  Take if you go more than two days without a movement. Do not use MiraLax more than once per day. Call your doctor if you are still constipated or irregular after using this medication for 7 days in a row.  If you continue to have problems with postoperative constipation, please contact the office for further assistance and recommendations.  If you experience "the worst abdominal  pain ever" or develop  nausea or vomiting, please contact the office immediatly for further recommendations for treatment.  When discharged from the skilled rehab facility, please have the facility set up the patient's [place] prior to being released.  Please make sure this gets set up prior to release in order to avoid any lapse of therapy following the rehab stay.  Also provide the patient with their medications at time of release from the facility to include their pain medication, the muscle relaxants, and their blood thinner medication.  If the patient is still at the rehab facility at time of follow up appointment, please also assist the patient in arranging follow up appointment in our office and any transportation needs. ICE to the affected knee or hip every three hours for 30 minutes at a time and then as needed for pain and swelling.  Take Coumadin for a total of four weeks and then discontinue.  The dose may need to be adjusted based upon the INR.  Please follow the INR and titrate Coumadin dose for a therapeutic range between 2.0 and 3.0 INR.  After completing the four weeks of Coumadin, the patient may stop the Coumadin and resume their 81 mg Aspirin daily.  Continue Lovenox injections until the INR is therapeutic at or greater than 2.0.  When INR reaches the therapeutic level of equal to or greater than 2.0, the patient may discontinue the Lovenox injections.     Do not put a pillow under the knee. Place it under the heel.    Complete by:  As directed      Do not sit on low chairs, stoools or toilet seats, as it may be difficult to get up from low surfaces    Complete by:  As directed      Driving restrictions    Complete by:  As directed   No driving until released by the physician.     Full weight bearing    Complete by:  As directed   Laterality:  bilateral  Extremity:  Lower     Increase activity slowly as tolerated    Complete by:  As directed      Lifting restrictions     Complete by:  As directed   No lifting until released by the physician.     Patient may shower    Complete by:  As directed   You may shower without a dressing once there is no drainage.  Do not wash over the wound.  If drainage remains, do not shower until drainage stops.     TED hose    Complete by:  As directed   Use stockings (TED hose) for 3 weeks on both leg(s).  You may remove them at night for sleeping.     Weight bearing as tolerated    Complete by:  As directed   Laterality:  bilateral  Extremity:  Lower            Medication List    STOP taking these medications        aspirin 81 MG EC tablet     fish oil-omega-3 fatty acids 1000 MG capsule     ibuprofen 200 MG tablet  Commonly known as:  ADVIL,MOTRIN      TAKE these medications        acetaminophen 500 MG tablet  Commonly known as:  TYLENOL  Take 1,000 mg by mouth every 6 (six) hours as needed for moderate pain or  headache.     ALPRAZolam 1 MG tablet  Commonly known as:  XANAX  Take 1 tablet (1 mg total) by mouth daily as needed for anxiety.     bisacodyl 10 MG suppository  Commonly known as:  DULCOLAX  Place 1 suppository (10 mg total) rectally daily as needed for moderate constipation.     CERAVE Crea  Apply 1 application topically daily as needed (psoriasis).     diphenhydrAMINE 25 MG tablet  Commonly known as:  BENADRYL  Take 1 tablet (25 mg total) by mouth at bedtime as needed for sleep.     docusate sodium 100 MG capsule  Commonly known as:  COLACE  Take 1 capsule (100 mg total) by mouth 2 (two) times daily.     enoxaparin 30 MG/0.3ML injection  Commonly known as:  LOVENOX  Inject 0.3 mLs (30 mg total) into the skin daily. Continue Lovenox injections until the INR is therapeutic at or greater than 2.0.  When INR reaches the therapeutic level of equal to or greater than 2.0, the patient may discontinue the Lovenox injections.     fluocinonide cream 0.05 %  Commonly known as:  LIDEX  Apply 1  application topically at bedtime.     gemfibrozil 600 MG tablet  Commonly known as:  LOPID  Take 600 mg by mouth every morning.     HYDROmorphone 2 MG tablet  Commonly known as:  DILAUDID  Take 1-3 tablets (2-6 mg total) by mouth every 4 (four) hours as needed for moderate pain or severe pain.     levothyroxine 75 MCG tablet  Commonly known as:  SYNTHROID, LEVOTHROID  Take 75 mcg by mouth every morning.     lisinopril 20 MG tablet  Commonly known as:  PRINIVIL,ZESTRIL  Take 20 mg by mouth every morning.     methocarbamol 500 MG tablet  Commonly known as:  ROBAXIN  Take 1 tablet (500 mg total) by mouth every 6 (six) hours as needed for muscle spasms.     metoCLOPramide 5 MG tablet  Commonly known as:  REGLAN  Take 1 tablet (5 mg total) by mouth every 8 (eight) hours as needed for nausea (if ondansetron (ZOFRAN) ineffective.).     nitroGLYCERIN 0.4 MG SL tablet  Commonly known as:  NITROSTAT  Place 1 tablet (0.4 mg total) under the tongue every 5 (five) minutes as needed.     ondansetron 4 MG tablet  Commonly known as:  ZOFRAN  Take 1 tablet (4 mg total) by mouth every 6 (six) hours as needed for nausea.     polyethylene glycol packet  Commonly known as:  MIRALAX / GLYCOLAX  Take 17 g by mouth daily as needed for mild constipation.     traMADol 50 MG tablet  Commonly known as:  ULTRAM  Take 1-2 tablets (50-100 mg total) by mouth every 6 (six) hours as needed (mld pain).     triamcinolone cream 0.1 %  Commonly known as:  KENALOG  Apply 1 application topically at bedtime.     warfarin 5 MG tablet  Commonly known as:  COUMADIN  Take 1 tablet (5 mg total) by mouth daily. Take Coumadin for a total of four weeks and then discontinue.  The dose may need to be adjusted based upon the INR.  Please follow the INR and titrate Coumadin dose for a therapeutic range between 2.0 and 3.0 INR.  After completing the four weeks of Coumadin, the patient may stop the Coumadin and  resume  their 81 mg Aspirin daily.       Follow-up Information    Follow up with Gearlean Alf, MD. Schedule an appointment as soon as possible for a visit on 06/26/2015.   Specialty:  Orthopedic Surgery   Why:  Call office ASAP at [phone] to setup appointment on Thursday 06/26/2015 with Dr. Wynelle Link.   Contact information:   [address] [place] [address] [phone]       Signed: [name], PA-C Orthopaedic Surgery 06/17/2015, 7:57 AM

## 2015-06-16 NOTE — Progress Notes (Signed)
CSW assisting with d/c planning. PA reports pt is not ready for d/c today. Pt has a SNF bed at [facility] when medically stable. CSW will continue to follow to assist with d/c planning.  Werner Lean LCSW [phone]

## 2015-06-16 NOTE — Progress Notes (Signed)
Physical Therapy Treatment Patient Details Name: Renee [name] MRN: [id number] DOB: November 16, 1946 Today's Date: 2015-07-02    History of Present Illness B TKA    PT Comments    Progressing well with ROM Bil knees  Follow Up Recommendations  SNF     Equipment Recommendations  None recommended by PT    Recommendations for Other Services OT consult     Precautions / Restrictions Precautions Precautions: Fall;Knee Restrictions Weight Bearing Restrictions: No Other Position/Activity Restrictions: WBAT    Mobility  Bed Mobility Overal bed mobility: Needs Assistance Bed Mobility: Sit to Supine       Sit to supine: Min assist   General bed mobility comments: MIn assist Bil LEs  Transfers Overall transfer level: Needs assistance Equipment used: Rolling walker (2 wheeled) Transfers: Sit to/from Stand Sit to Stand: Min guard         General transfer comment: cues for UE and LE placement, min/guard for safety but no physical assist  Ambulation/Gait Ambulation/Gait assistance: Min guard Ambulation Distance (Feet): 5 Feet Assistive device: Rolling walker (2 wheeled) Gait Pattern/deviations: Step-to pattern;Decreased step length - right;Decreased step length - left;Shuffle;Trunk flexed Gait velocity: decreased   General Gait Details: pt side stepped up side of bed   Stairs            Wheelchair Mobility    Modified Rankin (Stroke Patients Only)       Balance                                    Cognition Arousal/Alertness: Awake/alert Behavior During Therapy: WFL for tasks assessed/performed Overall Cognitive Status: Within Functional Limits for tasks assessed                      Exercises Total Joint Exercises Ankle Circles/Pumps: AROM;Both;10 reps;Supine Quad Sets: AROM;Both;Supine;20 reps Heel Slides: AAROM;Both;Supine;20 reps Straight Leg Raises: AROM;AAROM;Both;Supine;20 reps Goniometric ROM: L knee -10- 80; R knee -5 -  80    General Comments        Pertinent Vitals/Pain Pain Assessment: 0-10 Pain Score: 3  Pain Location: Bil knees - min pain at rest Pain Descriptors / Indicators: Aching;Sore Pain Intervention(s): Limited activity within patient's tolerance;Monitored during session;Premedicated before session (pt declines icepacks)    Home Living                      Prior Function            PT Goals (current goals can now be found in the care plan section) Acute Rehab PT Goals Patient Stated Goal: to walk at [facility], to dance PT Goal Formulation: With patient/family Time For Goal Achievement: 06/27/15 Potential to Achieve Goals: Good Progress towards PT goals: Progressing toward goals    Frequency  7X/week    PT Plan Current plan remains appropriate    Co-evaluation             End of Session   Activity Tolerance: Patient tolerated treatment well;Patient limited by fatigue Patient left: in bed;with call bell/phone within reach;with family/visitor present     Time: 4097-3532 PT Time Calculation (min) (ACUTE ONLY): 31 min  Charges:  $Therapeutic Exercise: 23-37 mins                    G Codes:      Yaeli Hartung 07-02-15, 4:33 PM

## 2015-06-16 NOTE — Progress Notes (Signed)
   Subjective: 4 Days Post-Op Procedure(s) (LRB): TOTAL KNEE BILATERAL (Bilateral) Patient reports pain as mild.   Plan is to go Rehab after hospital stay.  Objective: Vital signs in last 24 hours: Temp:  [98 F (36.7 C)-98.5 F (36.9 C)] 98 F (36.7 C) (06/20 0631) Pulse Rate:  [79-82] 82 (06/20 0631) Resp:  [16] 16 (06/20 0631) BP: (124-138)/(55-68) 126/55 mmHg (06/20 0631) SpO2:  [93 %-95 %] 95 % (06/20 0631)  Intake/Output from previous day:  Intake/Output Summary (Last 24 hours) at 06/16/15 0636 Last data filed at 06/15/15 2251  Gross per 24 hour  Intake    960 ml  Output      0 ml  Net    960 ml    Intake/Output this shift: Total I/O In: 480 [P.O.:480] Out: -   Labs:  Recent Labs  06/14/15 0446 06/15/15 0550 06/16/15 0450  HGB 9.3* 9.8* 9.4*    Recent Labs  06/15/15 0550 06/16/15 0450  WBC 11.8* 9.6  RBC 3.23* 3.11*  HCT 30.6* 29.1*  PLT 377 346    Recent Labs  06/14/15 0446 06/15/15 0550  NA 143 139  K 4.2 3.5  CL 107 98*  CO2 27 27  BUN 14 13  CREATININE 0.60 0.81  GLUCOSE 144* 175*  CALCIUM 9.3 9.2    Recent Labs  06/15/15 0550 06/16/15 0450  INR 1.14 1.24    EXAM General - Patient is Alert, Appropriate and Oriented Extremity - Neurologically intact Neurovascular intact No cellulitis present Compartment soft Dressing/Incision - clean, dry, no drainage Motor Function - intact, moving foot and toes well on exam.   Past Medical History  Diagnosis Date  . Hyperlipidemia   . Hypothyroidism   . CAD (coronary artery disease)     non-obstructive. cath 11/11  . Hypertension   . History of phobia     clastrophobia  . Diabetes mellitus     borderline diabetic - diet controlled   . Kidney stones     hx of   . Urinary tract infection     hx of   . Arthritis   . Psoriasis     Assessment/Plan: 4 Days Post-Op Procedure(s) (LRB): TOTAL KNEE BILATERAL (Bilateral) Principal Problem:   OA (osteoarthritis) of  knee   Discharge to rehab today/tomorrow if bed available. If no bed available then discharge to SNF today  DVT Prophylaxis - Lovenox and Coumadin Weight-Bearing as tolerated to bilateral legs  Raenell Mensing V 06/16/2015, 6:36 AM

## 2015-06-16 NOTE — Progress Notes (Signed)
[facility] for Warfarin Indication: VTE prophylaxis  Allergies  Allergen Reactions  . Other Rash    Metal   . Codeine Nausea And Vomiting  . Metformin Nausea And Vomiting  . Simvastatin Swelling and Rash   Patient Measurements: Height: 5\' 4"  (162.6 cm) Weight: 156 lb (70.761 kg) IBW/kg (Calculated) : 54.7  Vital Signs: Temp: 98.6 F (37 C) (06/20 1400) Temp Source: Axillary (06/20 1400) BP: 135/47 mmHg (06/20 1400) Pulse Rate: 83 (06/20 1400)  Labs:  Recent Labs  06/14/15 0446 06/15/15 0550 06/16/15 0450  HGB 9.3* 9.8* 9.4*  HCT 28.1* 30.6* 29.1*  PLT 297 377 346  LABPROT 15.3* 14.8 15.8*  INR 1.20 1.14 1.24  CREATININE 0.60 0.81  --     Estimated Creatinine Clearance: 62.3 mL/min (by C-G formula based on Cr of 0.81).  Medical History: Past Medical History  Diagnosis Date  . Hyperlipidemia   . Hypothyroidism   . CAD (coronary artery disease)     non-obstructive. cath 11/11  . Hypertension   . History of phobia     clastrophobia  . Diabetes mellitus     borderline diabetic - diet controlled   . Kidney stones     hx of   . Urinary tract infection     hx of   . Arthritis   . Psoriasis    Medications:  Scheduled:  . docusate sodium  100 mg Oral BID  . enoxaparin (LOVENOX) injection  30 mg Subcutaneous Q24H  . gemfibrozil  600 mg Oral QAC breakfast  . levothyroxine  75 mcg Oral QAC breakfast  . lisinopril  20 mg Oral Daily  . Warfarin - Pharmacist Dosing Inpatient   Does not apply q1800    Assessment: 9 yoF s/p bilateral TKA. Warfarin ordered for VTE prophylaxis, Pharmacy to dose. Not on any anticoagulant PTA.  Baseline INR on 6/10 = 0.97, no anticoagulants prior to admit  Ropivacaine epidural anesthesia; first dose Warfarin  given on 6/17 @ 10am  Epidural removed on 6/18 at ~0800  Today, 06/16/2015: POD#4 - INR = 1.24: sub-therapeutic; slow to trend upward (epidural removed 6/18) - CBC: Hgb low but  stable, pltc WNL - no bleeding documented - Drug interactions:  Gemfibrozil often increases INR response to warfarin - Diet: not consistently charted  Goal Range:   INR 2-3   Plan:  - warfarin  5mg  PO x1 today  - Plan is discharge to rehab 6/21 on warfarin 5mg  daily- this appears to be  Appropriate at this time. Follow INR trend - Lovenox 30mg  SQ q24h started 6/19PM (stop when INR >= 2) - Daily PT/INR while inpatient - monitor for s/s bleeding  Doreene Eland, PharmD, BCPS.   Pager: [phone] 06/16/2015 5:08 PM

## 2015-06-17 DIAGNOSIS — M170 Bilateral primary osteoarthritis of knee: Secondary | ICD-10-CM | POA: Diagnosis not present

## 2015-06-17 DIAGNOSIS — E785 Hyperlipidemia, unspecified: Secondary | ICD-10-CM | POA: Diagnosis not present

## 2015-06-17 DIAGNOSIS — Z96653 Presence of artificial knee joint, bilateral: Secondary | ICD-10-CM | POA: Diagnosis not present

## 2015-06-17 DIAGNOSIS — E039 Hypothyroidism, unspecified: Secondary | ICD-10-CM | POA: Diagnosis not present

## 2015-06-17 DIAGNOSIS — M6281 Muscle weakness (generalized): Secondary | ICD-10-CM | POA: Diagnosis not present

## 2015-06-17 DIAGNOSIS — R262 Difficulty in walking, not elsewhere classified: Secondary | ICD-10-CM | POA: Diagnosis not present

## 2015-06-17 DIAGNOSIS — R791 Abnormal coagulation profile: Secondary | ICD-10-CM | POA: Diagnosis not present

## 2015-06-17 DIAGNOSIS — M25561 Pain in right knee: Secondary | ICD-10-CM | POA: Diagnosis not present

## 2015-06-17 DIAGNOSIS — R278 Other lack of coordination: Secondary | ICD-10-CM | POA: Diagnosis not present

## 2015-06-17 DIAGNOSIS — I10 Essential (primary) hypertension: Secondary | ICD-10-CM | POA: Diagnosis not present

## 2015-06-17 DIAGNOSIS — M79675 Pain in left toe(s): Secondary | ICD-10-CM | POA: Diagnosis not present

## 2015-06-17 DIAGNOSIS — K5900 Constipation, unspecified: Secondary | ICD-10-CM | POA: Diagnosis not present

## 2015-06-17 DIAGNOSIS — D62 Acute posthemorrhagic anemia: Secondary | ICD-10-CM | POA: Diagnosis not present

## 2015-06-17 DIAGNOSIS — Z471 Aftercare following joint replacement surgery: Secondary | ICD-10-CM | POA: Diagnosis not present

## 2015-06-17 LAB — PROTIME-INR
INR: 1.5 — ABNORMAL HIGH (ref 0.00–1.49)
PROTHROMBIN TIME: 18.2 s — AB (ref 11.6–15.2)

## 2015-06-17 MED ORDER — DIPHENHYDRAMINE HCL 25 MG PO TABS: 25.0000 mg | ORAL_TABLET | Freq: Every evening | ORAL | Status: DC | PRN

## 2015-06-17 NOTE — Plan of Care (Signed)
Problem: Consults Goal: Diagnosis- Total Joint Replacement Outcome: Completed/Met Date Met:  06/17/15 Primary Total Knee

## 2015-06-17 NOTE — Progress Notes (Signed)
   Subjective: 5 Days Post-Op Procedure(s) (LRB): TOTAL KNEE BILATERAL (Bilateral) Patient reports pain as mild.   Patient seen in rounds with Dr. Wynelle Link. Patient is well, but has had some minor complaints of pain in the knees, requiring pain medications Patient is ready to go to the Rehab/SNF today.  Objective: Vital signs in last 24 hours: Temp:  [98 F (36.7 C)-98.6 F (37 C)] 98 F (36.7 C) (06/21 0630) Pulse Rate:  [73-92] 73 (06/21 0630) Resp:  [14-16] 16 (06/21 0630) BP: (121-135)/(35-66) 134/52 mmHg (06/21 0630) SpO2:  [95 %-96 %] 95 % (06/21 0630)  Intake/Output from previous day:  Intake/Output Summary (Last 24 hours) at 06/17/15 0751 Last data filed at 06/17/15 0600  Gross per 24 hour  Intake   1880 ml  Output      0 ml  Net   1880 ml    Labs:  Recent Labs  06/15/15 0550 06/16/15 0450  HGB 9.8* 9.4*    Recent Labs  06/15/15 0550 06/16/15 0450  WBC 11.8* 9.6  RBC 3.23* 3.11*  HCT 30.6* 29.1*  PLT 377 346    Recent Labs  06/15/15 0550  NA 139  K 3.5  CL 98*  CO2 27  BUN 13  CREATININE 0.81  GLUCOSE 175*  CALCIUM 9.2    Recent Labs  06/16/15 0450 06/17/15 0445  INR 1.24 1.50*    EXAM: General - Patient is Alert, Appropriate and Oriented Extremity - Neurovascular intact Sensation intact distally Dorsiflexion/Plantar flexion intact Incision - clean, dry, no drainage, healing to both incisions Motor Function - intact, moving feett and toes well on exam.   Assessment/Plan: 5 Days Post-Op Procedure(s) (LRB): TOTAL KNEE BILATERAL (Bilateral) Procedure(s) (LRB): TOTAL KNEE BILATERAL (Bilateral) Past Medical History  Diagnosis Date  . Hyperlipidemia   . Hypothyroidism   . CAD (coronary artery disease)     non-obstructive. cath 11/11  . Hypertension   . History of phobia     clastrophobia  . Diabetes mellitus     borderline diabetic - diet controlled   . Kidney stones     hx of   . Urinary tract infection     hx of   .  Arthritis   . Psoriasis    Principal Problem:   OA (osteoarthritis) of knee  Estimated body mass index is 26.76 kg/(m^2) as calculated from the following:   Height as of this encounter: 5\' 4"  (1.626 m).   Weight as of this encounter: 70.761 kg (156 lb). Discharge to SNF Diet - Cardiac diet and Diabetic diet Follow up - in 1 week, Next Thursday 06/26/2015 Activity - WBAT to both legs Disposition - Skilled nursing facility Condition Upon Discharge - Good D/C Meds - See DC Summary DVT Prophylaxis - Lovenox and Coumadin  [name], PA-C Orthopaedic Surgery 06/17/2015, 7:51 AM

## 2015-06-17 NOTE — Progress Notes (Signed)
Physical Therapy Treatment Patient Details Name: Renee [name] MRN: [id number] DOB: 1946-01-31 Today's Date: 06/17/2015    History of Present Illness B TKA    PT Comments    Pt progressing well with mobility, she's ready to DC to SNF from PT standpoint. She walked 55' with RW and performed B TKA exercises with min A.   Follow Up Recommendations  SNF     Equipment Recommendations  None recommended by PT    Recommendations for Other Services OT consult     Precautions / Restrictions Precautions Precautions: Fall;Knee Restrictions Weight Bearing Restrictions: No Other Position/Activity Restrictions: WBAT    Mobility  Bed Mobility Overal bed mobility: Modified Independent Bed Mobility: Supine to Sit     Supine to sit: Modified independent (Device/Increase time)     General bed mobility comments: used bedrail, bed flat  Transfers Overall transfer level: Needs assistance Equipment used: Rolling walker (2 wheeled) Transfers: Sit to/from Stand Sit to Stand: Supervision         General transfer comment: cues for UE and LE placement  Ambulation/Gait Ambulation/Gait assistance: Supervision Ambulation Distance (Feet): 70 Feet Assistive device: Rolling walker (2 wheeled) Gait Pattern/deviations: Step-to pattern;Decreased step length - right;Decreased step length - left;Antalgic Gait velocity: decreased Gait velocity interpretation: Below normal speed for age/gender General Gait Details: cues for posture   Stairs            Wheelchair Mobility    Modified Rankin (Stroke Patients Only)       Balance     Sitting balance-Leahy Scale: Good       Standing balance-Leahy Scale: Poor                      Cognition Arousal/Alertness: Awake/alert Behavior During Therapy: WFL for tasks assessed/performed Overall Cognitive Status: Within Functional Limits for tasks assessed                      Exercises Total Joint Exercises Ankle  Circles/Pumps: AROM;Both;10 reps;Supine Quad Sets: AROM;Both;10 reps;Supine Short Arc Quad: AROM;AAROM;Both;Supine;20 reps (AROM RLE, AAROM LLE) Heel Slides: AAROM;Both;Supine;20 reps Hip ABduction/ADduction: AAROM;Both;20 reps;Supine Straight Leg Raises: AROM;AAROM;Both;10 reps;Supine (AROM RLE, AAROM LLE) Long Arc Quad: AROM;AAROM;Both;10 reps;Seated Goniometric ROM: L knee -10-65 AAROM; R knee -5-70* AAROM    General Comments        Pertinent Vitals/Pain Pain Score: 10-Worst pain ever Pain Location: B knees with walking Pain Descriptors / Indicators: Sore Pain Intervention(s): Ice applied;Limited activity within patient's tolerance;Monitored during session;Premedicated before session    Home Living                      Prior Function            PT Goals (current goals can now be found in the care plan section) Acute Rehab PT Goals Patient Stated Goal: to walk at [facility], to dance PT Goal Formulation: With patient/family Time For Goal Achievement: 06/27/15 Potential to Achieve Goals: Good Progress towards PT goals: Goals met/education completed, patient discharged from PT    Frequency  7X/week    PT Plan Current plan remains appropriate    Co-evaluation             End of Session   Activity Tolerance: Patient limited by pain Patient left: with call bell/phone within reach;with family/visitor present;in chair     Time: 8366-2947 PT Time Calculation (min) (ACUTE ONLY): 44 min  Charges:  $Gait Training: 8-22 mins $Therapeutic  Exercise: 8-22 mins $Therapeutic Activity: 8-22 mins                    G Codes:      Philomena Doheny 06/17/2015, 9:21 AM [phone]

## 2015-06-17 NOTE — Progress Notes (Signed)
CSW will assist with d/c planning to [facility] today. [facility] is unable to offer rehab placement at this time.  Werner Lean LCSW [phone]

## 2015-06-17 NOTE — Clinical Social Work Placement (Signed)
   CLINICAL SOCIAL WORK PLACEMENT  NOTE  Date:  06/17/2015  Patient Details  Name: Renee [name] MRN: [id number] Date of Birth: [birth year]  Clinical Social Work is seeking post-discharge placement for this patient at the [place] level of care (*CSW will initial, date and re-position this form in  chart as items are completed):  Yes   Patient/family provided with [facility] Work Department's list of facilities offering this level of care within the geographic area requested by the patient (or if unable, by the patient's family).  Yes   Patient/family informed of their freedom to choose among providers that offer the needed level of care, that participate in Medicare, Medicaid or managed care program needed by the patient, have an available bed and are willing to accept the patient.  Yes   Patient/family informed of [name]'s ownership interest in [facility] and [facility], as well as of the fact that they are under no obligation to receive care at these facilities.  PASRR submitted to EDS on 06/13/15     PASRR number received on 06/13/15     Existing PASRR number confirmed on       FL2 transmitted to all facilities in geographic area requested by pt/family on 06/13/15     FL2 transmitted to all facilities within larger geographic area on       Patient informed that his/her managed care company has contracts with or will negotiate with certain facilities, including the following:        Yes   Patient/family informed of bed offers received.  Patient chooses bed at [facility]     Physician recommends and patient chooses bed at      Patient to be transferred to [facility] on 06/17/15.  Patient to be transferred to facility by [facility]     Patient family notified on 06/17/15 of transfer.  Name of family member notified:  DAUGHTER     PHYSICIAN Please sign FL2     Additional Comment: Pt / family are in agreement with d/c to SNF  today. [facility] transport required. Pt / family are aware that out of pocket costs may be associated with [facility] transport. NSG reviewed d/c summary, scripts, avs. Scripts included in d/c packet.   _______________________________________________ Luretha Rued, LCSW 06/17/2015, 3:48 PM

## 2015-06-19 ENCOUNTER — Non-Acute Institutional Stay (SKILLED_NURSING_FACILITY): Payer: Medicare Other | Admitting: Internal Medicine

## 2015-06-19 DIAGNOSIS — E785 Hyperlipidemia, unspecified: Secondary | ICD-10-CM

## 2015-06-19 DIAGNOSIS — R791 Abnormal coagulation profile: Secondary | ICD-10-CM | POA: Diagnosis not present

## 2015-06-19 DIAGNOSIS — M79675 Pain in left toe(s): Secondary | ICD-10-CM

## 2015-06-19 DIAGNOSIS — D62 Acute posthemorrhagic anemia: Secondary | ICD-10-CM | POA: Diagnosis not present

## 2015-06-19 DIAGNOSIS — M170 Bilateral primary osteoarthritis of knee: Secondary | ICD-10-CM | POA: Diagnosis not present

## 2015-06-19 DIAGNOSIS — I10 Essential (primary) hypertension: Secondary | ICD-10-CM | POA: Diagnosis not present

## 2015-06-19 DIAGNOSIS — E039 Hypothyroidism, unspecified: Secondary | ICD-10-CM | POA: Diagnosis not present

## 2015-06-19 DIAGNOSIS — K5900 Constipation, unspecified: Secondary | ICD-10-CM | POA: Diagnosis not present

## 2015-06-19 NOTE — Progress Notes (Signed)
Patient ID: Renee [name], female   DOB: [birth year], 69 y.o.   MRN: [id number]     Facility: [facility]    PCP: Margarita Rana, MD  Code Status: full code  Allergies  Allergen Reactions  . Other Rash    Metal   . Codeine Nausea And Vomiting  . Metformin Nausea And Vomiting  . Simvastatin Swelling and Rash    Chief Complaint  Patient presents with  . New Admit To SNF     HPI:  69 year old patient is here for short term rehabilitation post hospital admission from 06/12/15-06/17/15 with bilateral knee OA. She underwent total knee arthroplasty of both knees. She is seen in her room with her daughter. She complaints of being in pain at present and asks for her pain medication. She also complaints of pain in her left foot- mainly in forefoot area and big toe. She denies any swelling or redness but has pain even at rest. She mentions having similar symptom with redness and swelling of her left great toe about a year back, she took ibuprofen and applied biofreeze and it had resolved. No know history of gout. She is concerned about her BP as it had been low in the hospital. Denies any headache or dizziness.  Review of Systems:  Constitutional: Negative for fever, chills, diaphoresis.  HENT: Negative for headache, congestion, nasal discharge Eyes: Negative for eye pain, blurred vision, double vision and discharge.  Respiratory: Negative for cough, shortness of breath and wheezing.   Cardiovascular: Negative for chest pain, palpitation. Has leg swelling.  Gastrointestinal: Negative for heartburn, nausea, vomiting, abdominal pain. Has a bowel movement everyday Genitourinary: Negative for dysuria Musculoskeletal: Negative for back pain, falls Skin: Negative for itching, rash.  Neurological: Negative for dizziness, tingling, focal weakness Psychiatric/Behavioral: Negative for depression  Past Medical History  Diagnosis Date  . Hyperlipidemia   . Hypothyroidism     . CAD (coronary artery disease)     non-obstructive. cath 11/11  . Hypertension   . History of phobia     clastrophobia  . Diabetes mellitus     borderline diabetic - diet controlled   . Kidney stones     hx of   . Urinary tract infection     hx of   . Arthritis   . Psoriasis    Past Surgical History  Procedure Laterality Date  . Appendectomy    . Tubes tied      and untied. and tied again  . Carpal tunnel release      Left trigger finger  . Cardiac catheterization      2011  . Tubal ligation    . Eye surgery      lasik - right eye   . Total knee arthroplasty Bilateral 06/12/2015    Procedure: TOTAL KNEE BILATERAL;  Surgeon: Gaynelle Arabian, MD;  Location: [facility];  Service: Orthopedics;  Laterality: Bilateral;  and epidural   Social History:   reports that she has never smoked. She has never used smokeless tobacco. She reports that she drinks alcohol. She reports that she does not use illicit drugs.  Family History  Problem Relation Age of Onset  . Family history unknown: Yes    Medications: Patient's Medications  New Prescriptions   No medications on file  Previous Medications   ACETAMINOPHEN (TYLENOL) 500 MG TABLET    Take 1,000 mg by mouth every 6 (six) hours as needed for moderate pain or headache.   ALPRAZOLAM (  XANAX) 1 MG TABLET    Take 1 tablet (1 mg total) by mouth daily as needed for anxiety.   BISACODYL (DULCOLAX) 10 MG SUPPOSITORY    Place 1 suppository (10 mg total) rectally daily as needed for moderate constipation.   DIPHENHYDRAMINE (BENADRYL) 25 MG TABLET    Take 1 tablet (25 mg total) by mouth at bedtime as needed for sleep.   DOCUSATE SODIUM (COLACE) 100 MG CAPSULE    Take 1 capsule (100 mg total) by mouth 2 (two) times daily.   EMOLLIENT (CERAVE) CREA    Apply 1 application topically daily as needed (psoriasis).    ENOXAPARIN (LOVENOX) 30 MG/0.3ML INJECTION    Inject 0.3 mLs (30 mg total) into the skin daily. Continue Lovenox injections until the INR  is therapeutic at or greater than 2.0.  When INR reaches the therapeutic level of equal to or greater than 2.0, the patient may discontinue the Lovenox injections.   FLUOCINONIDE (LIDEX) 0.05 % CREAM    Apply 1 application topically at bedtime.    GEMFIBROZIL (LOPID) 600 MG TABLET    Take 600 mg by mouth every morning.    HYDROMORPHONE (DILAUDID) 2 MG TABLET    Take 1-3 tablets (2-6 mg total) by mouth every 4 (four) hours as needed for moderate pain or severe pain.   LEVOTHYROXINE (SYNTHROID, LEVOTHROID) 75 MCG TABLET    Take 75 mcg by mouth every morning.    LISINOPRIL (PRINIVIL,ZESTRIL) 20 MG TABLET    Take 20 mg by mouth every morning.    METHOCARBAMOL (ROBAXIN) 500 MG TABLET    Take 1 tablet (500 mg total) by mouth every 6 (six) hours as needed for muscle spasms.   METOCLOPRAMIDE (REGLAN) 5 MG TABLET    Take 1 tablet (5 mg total) by mouth every 8 (eight) hours as needed for nausea (if ondansetron (ZOFRAN) ineffective.).   NITROGLYCERIN (NITROSTAT) 0.4 MG SL TABLET    Place 1 tablet (0.4 mg total) under the tongue every 5 (five) minutes as needed.   ONDANSETRON (ZOFRAN) 4 MG TABLET    Take 1 tablet (4 mg total) by mouth every 6 (six) hours as needed for nausea.   POLYETHYLENE GLYCOL (MIRALAX / GLYCOLAX) PACKET    Take 17 g by mouth daily as needed for mild constipation.   TRAMADOL (ULTRAM) 50 MG TABLET    Take 1-2 tablets (50-100 mg total) by mouth every 6 (six) hours as needed (mld pain).   TRIAMCINOLONE (KENALOG) 0.1 % CREAM    Apply 1 application topically at bedtime.    WARFARIN (COUMADIN) 5 MG TABLET    Take 1 tablet (5 mg total) by mouth daily. Take Coumadin for a total of four weeks and then discontinue.  The dose may need to be adjusted based upon the INR.  Please follow the INR and titrate Coumadin dose for a therapeutic range between 2.0 and 3.0 INR.  After completing the four weeks of Coumadin, the patient may stop the Coumadin and resume their 81 mg Aspirin daily.  Modified Medications    No medications on file  Discontinued Medications   No medications on file     Physical Exam: Filed Vitals:   06/19/15 1314  BP: 117/65  Pulse: 78  Temp: 98 F (36.7 C)  Resp: 16  SpO2: 95%    General- elderly female, well built, in no acute distress Head- normocephalic, atraumatic Throat- moist mucus membrane Eyes- PERRLA, EOMI, no pallor, no icterus, no discharge, normal conjunctiva, normal sclera  Neck- no cervical lymphadenopathy, no jugular vein distension Cardiovascular- normal s1,s2, no murmurs, palpable dorsalis pedis  Respiratory- bilateral clear to auscultation, no wheeze, no rhonchi, no crackles, no use of accessory muscles Abdomen- bowel sounds present, soft, non tender Musculoskeletal- able to move all 4 extremities, trace bilateral edema, ted hose in both legs, reproducible tenderness to left big toe and metatarsal area. No redness or swelling Neurological- no focal deficit Skin- warm and dry, both knee surgical incision with steri strips healing well Psychiatry- alert and oriented to person, place and time, normal mood and affect    Labs reviewed: Basic Metabolic Panel:  Recent Labs  06/13/15 0421 06/14/15 0446 06/15/15 0550  NA 139 143 139  K 3.8 4.2 3.5  CL 108 107 98*  CO2 24 27 27   GLUCOSE 175* 144* 175*  BUN 19 14 13   CREATININE 0.72 0.60 0.81  CALCIUM 8.8* 9.3 9.2   Liver Function Tests:  Recent Labs  06/06/15 1445  AST 26  ALT 21  ALKPHOS 65  BILITOT 0.3  PROT 7.9  ALBUMIN 4.4   No results for input(s): LIPASE, AMYLASE in the last 8760 hours. No results for input(s): AMMONIA in the last 8760 hours. CBC:  Recent Labs  06/14/15 0446 06/15/15 0550 06/16/15 0450  WBC 11.4* 11.8* 9.6  HGB 9.3* 9.8* 9.4*  HCT 28.1* 30.6* 29.1*  MCV 93.7 94.7 93.6  PLT 297 377 346    Assessment/Plan  Bilateral knee OA S/p bilateral TKA. Will have her work with physical therapy and occupational therapy team to help with gait training and  muscle strengthening exercises.fall precautions. Skin care. Encourage to be out of bed. Continue dilaudid 2 mg 1-2 tab q4h prn pain with tramadol 50 mg 1-2 tab q6h prn for pain. Continue lovenox with coumadin 5 mg daily for dvt prophylaxis. inr yesterday 1.7, recheck on 06/20/15, goal inr 2-3. Continue robaxin 500 mg q6h prn muscle spasm. Has follow up with dr Wynelle Link.  Left great toe pain Concern for possible gout, check uric acid level, tylenol 650 mg tid for now with biofreeze gel bid, reassess if no improvement. Avoid nsaids with her on anticoagulation  Constipation Has regular bowel movement at present, continue colace 100 mgbid with prn miralax and dulcolax suppository. Hydration encouraged  Blood loss anemia Post op, monitor h&h  Hypertension On lisinopril 20 mg daily bp stable on review in the facility. Start bp check on daily basis with holding parameter for a week and reassess  Hypothyroidism Continue synthroid 75 mcg daily and monitor  HLD Continue home regimen lopid  subtherapeutic inr inr 1.7 on 06/18/15. Continue lovenox and coumadin 5 mg daily, check inr 06/20/15, goal inr 2-3 for dvt prophylaxis    Goals of care: short term rehabilitation   Labs/tests ordered: cbc, uric acid next lab  Family/ staff Communication: reviewed care plan with patient and nursing supervisor    Blanchie Serve, MD  [facility] [phone] (Monday-Friday 8 am - 5 pm) [phone] (afterhours)

## 2015-06-23 ENCOUNTER — Non-Acute Institutional Stay (SKILLED_NURSING_FACILITY): Payer: Medicare Other | Admitting: Nurse Practitioner

## 2015-06-23 DIAGNOSIS — E039 Hypothyroidism, unspecified: Secondary | ICD-10-CM

## 2015-06-23 DIAGNOSIS — I10 Essential (primary) hypertension: Secondary | ICD-10-CM | POA: Diagnosis not present

## 2015-06-23 DIAGNOSIS — M170 Bilateral primary osteoarthritis of knee: Secondary | ICD-10-CM

## 2015-06-23 DIAGNOSIS — K5900 Constipation, unspecified: Secondary | ICD-10-CM

## 2015-06-23 DIAGNOSIS — D62 Acute posthemorrhagic anemia: Secondary | ICD-10-CM

## 2015-06-23 DIAGNOSIS — M79675 Pain in left toe(s): Secondary | ICD-10-CM | POA: Diagnosis not present

## 2015-06-23 DIAGNOSIS — R791 Abnormal coagulation profile: Secondary | ICD-10-CM

## 2015-06-23 DIAGNOSIS — E785 Hyperlipidemia, unspecified: Secondary | ICD-10-CM | POA: Diagnosis not present

## 2015-06-23 MED ORDER — WARFARIN SODIUM 5 MG PO TABS: ORAL_TABLET | ORAL | Status: DC

## 2015-06-23 NOTE — Progress Notes (Signed)
Patient ID: Renee [name], female   DOB: 1946-04-06, 69 y.o.   MRN: [id number]    Nursing Home Location:  [place] [facility] Service: SNF (31)  PCP: Margarita Rana, MD  Allergies  Allergen Reactions  . Other Rash    Metal   . Codeine Nausea And Vomiting  . Metformin Nausea And Vomiting  . Simvastatin Swelling and Rash    Chief Complaint  Patient presents with  . Discharge Note    HPI:  Patient is a 69 y.o. female seen today at [facility] for discharge home. Pt with pmh of hypothyroidism, htn, hyperlipidemia, arthritis. Pt currently at [facility] for short term rehabilitation post hospital admission from 06/12/15-06/17/15 with bilateral knee OA. She underwent total knee arthroplasty of both knees. Had complained about left great toe pain on admission but this has now resolved. Pt reports pain is well controlled on medications, no problems with BM. Patient currently doing well with therapy, now stable to discharge home with home health.   Review of Systems:  Review of Systems  Constitutional: Negative for activity change, appetite change, fatigue and unexpected weight change.  HENT: Negative for congestion and hearing loss.   Eyes: Negative.   Respiratory: Negative for cough and shortness of breath.   Cardiovascular: Negative for chest pain, palpitations and leg swelling.  Gastrointestinal: Negative for abdominal pain, diarrhea and constipation.  Genitourinary: Negative for dysuria and difficulty urinating.  Musculoskeletal: Positive for arthralgias (pain well controlled). Negative for myalgias.  Skin: Negative for color change and wound.  Neurological: Negative for dizziness and weakness.  Psychiatric/Behavioral: Negative for behavioral problems, confusion and agitation.    Past Medical History  Diagnosis Date  . Hyperlipidemia   . Hypothyroidism   . CAD (coronary artery disease)     non-obstructive. cath 11/11  . Hypertension   .  History of phobia     clastrophobia  . Diabetes mellitus     borderline diabetic - diet controlled   . Kidney stones     hx of   . Urinary tract infection     hx of   . Arthritis   . Psoriasis    Past Surgical History  Procedure Laterality Date  . Appendectomy    . Tubes tied      and untied. and tied again  . Carpal tunnel release      Left trigger finger  . Cardiac catheterization      2011  . Tubal ligation    . Eye surgery      lasik - right eye   . Total knee arthroplasty Bilateral 06/12/2015    Procedure: TOTAL KNEE BILATERAL;  Surgeon: Gaynelle Arabian, MD;  Location: [facility];  Service: Orthopedics;  Laterality: Bilateral;  and epidural   Social History:   reports that she has never smoked. She has never used smokeless tobacco. She reports that she drinks alcohol. She reports that she does not use illicit drugs.  Family History  Problem Relation Age of Onset  . Family history unknown: Yes    Medications: Patient's Medications  New Prescriptions   No medications on file  Previous Medications   ACETAMINOPHEN (TYLENOL) 500 MG TABLET    Take 1,000 mg by mouth every 6 (six) hours as needed for moderate pain or headache.   ALPRAZOLAM (XANAX) 1 MG TABLET    Take 1 tablet (1 mg total) by mouth daily as needed for anxiety.   BISACODYL (DULCOLAX)  10 MG SUPPOSITORY    Place 1 suppository (10 mg total) rectally daily as needed for moderate constipation.   DIPHENHYDRAMINE (BENADRYL) 25 MG TABLET    Take 1 tablet (25 mg total) by mouth at bedtime as needed for sleep.   DOCUSATE SODIUM (COLACE) 100 MG CAPSULE    Take 1 capsule (100 mg total) by mouth 2 (two) times daily.   EMOLLIENT (CERAVE) CREA    Apply 1 application topically daily as needed (psoriasis).    ENOXAPARIN (LOVENOX) 30 MG/0.3ML INJECTION    Inject 0.3 mLs (30 mg total) into the skin daily. Continue Lovenox injections until the INR is therapeutic at or greater than 2.0.  When INR reaches the therapeutic level of equal to  or greater than 2.0, the patient may discontinue the Lovenox injections.   FLUOCINONIDE (LIDEX) 0.05 % CREAM    Apply 1 application topically at bedtime.    GEMFIBROZIL (LOPID) 600 MG TABLET    Take 600 mg by mouth every morning.    HYDROMORPHONE (DILAUDID) 2 MG TABLET    Take 1-3 tablets (2-6 mg total) by mouth every 4 (four) hours as needed for moderate pain or severe pain.   LEVOTHYROXINE (SYNTHROID, LEVOTHROID) 75 MCG TABLET    Take 75 mcg by mouth every morning.    LISINOPRIL (PRINIVIL,ZESTRIL) 20 MG TABLET    Take 20 mg by mouth every morning.    METHOCARBAMOL (ROBAXIN) 500 MG TABLET    Take 1 tablet (500 mg total) by mouth every 6 (six) hours as needed for muscle spasms.   METOCLOPRAMIDE (REGLAN) 5 MG TABLET    Take 1 tablet (5 mg total) by mouth every 8 (eight) hours as needed for nausea (if ondansetron (ZOFRAN) ineffective.).   NITROGLYCERIN (NITROSTAT) 0.4 MG SL TABLET    Place 1 tablet (0.4 mg total) under the tongue every 5 (five) minutes as needed.   ONDANSETRON (ZOFRAN) 4 MG TABLET    Take 1 tablet (4 mg total) by mouth every 6 (six) hours as needed for nausea.   POLYETHYLENE GLYCOL (MIRALAX / GLYCOLAX) PACKET    Take 17 g by mouth daily as needed for mild constipation.   TRAMADOL (ULTRAM) 50 MG TABLET    Take 1-2 tablets (50-100 mg total) by mouth every 6 (six) hours as needed (mld pain).   TRIAMCINOLONE (KENALOG) 0.1 % CREAM    Apply 1 application topically at bedtime.    WARFARIN (COUMADIN) 5 MG TABLET    Take 1 tablet (5 mg total) by mouth daily. Take Coumadin for a total of four weeks and then discontinue.  The dose may need to be adjusted based upon the INR.  Please follow the INR and titrate Coumadin dose for a therapeutic range between 2.0 and 3.0 INR.  After completing the four weeks of Coumadin, the patient may stop the Coumadin and resume their 81 mg Aspirin daily.  Modified Medications   No medications on file  Discontinued Medications   No medications on file      Physical Exam: Filed Vitals:   06/23/15 1013  BP: 141/67  Pulse: 87  Temp: 98.1 F (36.7 C)  Resp: 20    Physical Exam  Constitutional: She is oriented to person, place, and time. She appears well-developed and well-nourished. No distress.  HENT:  Head: Normocephalic and atraumatic.  Mouth/Throat: Oropharynx is clear and moist. No oropharyngeal exudate.  Eyes: Conjunctivae are normal. Pupils are equal, round, and reactive to light.  Neck: Normal range of motion. Neck  supple.  Cardiovascular: Normal rate, regular rhythm and normal heart sounds.   Pulmonary/Chest: Effort normal and breath sounds normal.  Abdominal: Soft. Bowel sounds are normal.  Musculoskeletal: She exhibits no edema or tenderness.  Neurological: She is alert and oriented to person, place, and time.  Skin: Skin is warm and dry. She is not diaphoretic.  Bilateral knee incisions healing well  Psychiatric: She has a normal mood and affect.    Labs reviewed: Basic Metabolic Panel:  Recent Labs  06/13/15 0421 06/14/15 0446 06/15/15 0550  NA 139 143 139  K 3.8 4.2 3.5  CL 108 107 98*  CO2 24 27 27   GLUCOSE 175* 144* 175*  BUN 19 14 13   CREATININE 0.72 0.60 0.81  CALCIUM 8.8* 9.3 9.2   Liver Function Tests:  Recent Labs  06/06/15 1445  AST 26  ALT 21  ALKPHOS 65  BILITOT 0.3  PROT 7.9  ALBUMIN 4.4   No results for input(s): LIPASE, AMYLASE in the last 8760 hours. No results for input(s): AMMONIA in the last 8760 hours. CBC:  Recent Labs  06/14/15 0446 06/15/15 0550 06/16/15 0450  WBC 11.4* 11.8* 9.6  HGB 9.3* 9.8* 9.4*  HCT 28.1* 30.6* 29.1*  MCV 93.7 94.7 93.6  PLT 297 377 346   TSH: No results for input(s): TSH in the last 8760 hours. A1C: Lab Results  Component Value Date   HGBA1C * 10/25/2010    6.2 (NOTE)                                                                       According to the ADA Clinical Practice Recommendations for 2011, when HbA1c is used as a  screening test:   >=6.5%   Diagnostic of Diabetes Mellitus           (if abnormal result  is confirmed)  5.7-6.4%   Increased risk of developing Diabetes Mellitus  References:Diagnosis and Classification of Diabetes Mellitus,Diabetes JFHL,4562,56(LSLHT 1):S62-S69 and Standards of Medical Care in         Diabetes - 2011,Diabetes DSKA,7681,15  (Suppl 1):S11-S61.   Lipid Panel: No results for input(s): CHOL, HDL, LDLCALC, TRIG, CHOLHDL, LDLDIRECT in the last 8760 hours.    Assessment/Plan 1. Primary osteoarthritis of both knees -s/p bilateral TKA. Pain controlled with current regimen, doing well with therapy.  -conts on coumadin and Lovenox   2. Great toe pain, left resolved  3. Constipation, unspecified constipation type Controlled, bowel movements daily   4. Essential hypertension -conts on lisinopril 20 mg daily, sbp slightly elevated today -follow up outpatient   5. Hypothyroidism, unspecified hypothyroidism type Synthroid 75 mcg  6. Acute blood loss anemia -hgb unchanged from hospitalization, will need further evaluation as outpatient   7. Hyperlipidemia -conts on lopid  8. Subtherapeutic international normalized ratio (INR) INR 1.8 today, Continue lovenox and coumadin, will increase coumadin to 7 mg daily and have [place] check INR on 6/30  -goal inr 2-3 for dvt prophylaxis - to stop Lovenox once INR over 2   pt is stable for discharge-will need PT/OT/Nursing per home health. No DME needed. Rx written.  will need to follow up with PCP within 2 weeks.    Carlos American. Harle Battiest  [facility] [phone] 8 am - 5 pm) [phone] (after hours)

## 2015-06-24 ENCOUNTER — Telehealth: Payer: Self-pay | Admitting: Family Medicine

## 2015-06-25 DIAGNOSIS — M6281 Muscle weakness (generalized): Secondary | ICD-10-CM | POA: Diagnosis not present

## 2015-06-25 DIAGNOSIS — I2510 Atherosclerotic heart disease of native coronary artery without angina pectoris: Secondary | ICD-10-CM | POA: Diagnosis not present

## 2015-06-25 DIAGNOSIS — Z471 Aftercare following joint replacement surgery: Secondary | ICD-10-CM | POA: Diagnosis not present

## 2015-06-25 DIAGNOSIS — E119 Type 2 diabetes mellitus without complications: Secondary | ICD-10-CM | POA: Diagnosis not present

## 2015-06-25 DIAGNOSIS — M1990 Unspecified osteoarthritis, unspecified site: Secondary | ICD-10-CM | POA: Diagnosis not present

## 2015-06-25 DIAGNOSIS — I10 Essential (primary) hypertension: Secondary | ICD-10-CM | POA: Diagnosis not present

## 2015-06-26 DIAGNOSIS — I10 Essential (primary) hypertension: Secondary | ICD-10-CM | POA: Diagnosis not present

## 2015-06-26 DIAGNOSIS — E119 Type 2 diabetes mellitus without complications: Secondary | ICD-10-CM | POA: Diagnosis not present

## 2015-06-26 DIAGNOSIS — M6281 Muscle weakness (generalized): Secondary | ICD-10-CM | POA: Diagnosis not present

## 2015-06-26 DIAGNOSIS — M1990 Unspecified osteoarthritis, unspecified site: Secondary | ICD-10-CM | POA: Diagnosis not present

## 2015-06-26 DIAGNOSIS — Z471 Aftercare following joint replacement surgery: Secondary | ICD-10-CM | POA: Diagnosis not present

## 2015-06-26 DIAGNOSIS — I2510 Atherosclerotic heart disease of native coronary artery without angina pectoris: Secondary | ICD-10-CM | POA: Diagnosis not present

## 2015-06-26 DIAGNOSIS — Z96651 Presence of right artificial knee joint: Secondary | ICD-10-CM | POA: Diagnosis not present

## 2015-06-26 DIAGNOSIS — Z96652 Presence of left artificial knee joint: Secondary | ICD-10-CM | POA: Diagnosis not present

## 2015-06-27 DIAGNOSIS — Z471 Aftercare following joint replacement surgery: Secondary | ICD-10-CM | POA: Diagnosis not present

## 2015-06-27 DIAGNOSIS — I10 Essential (primary) hypertension: Secondary | ICD-10-CM | POA: Diagnosis not present

## 2015-06-27 DIAGNOSIS — I2510 Atherosclerotic heart disease of native coronary artery without angina pectoris: Secondary | ICD-10-CM | POA: Diagnosis not present

## 2015-06-27 DIAGNOSIS — E119 Type 2 diabetes mellitus without complications: Secondary | ICD-10-CM | POA: Diagnosis not present

## 2015-06-27 DIAGNOSIS — M6281 Muscle weakness (generalized): Secondary | ICD-10-CM | POA: Diagnosis not present

## 2015-06-27 DIAGNOSIS — M1990 Unspecified osteoarthritis, unspecified site: Secondary | ICD-10-CM | POA: Diagnosis not present

## 2015-06-30 DIAGNOSIS — M1990 Unspecified osteoarthritis, unspecified site: Secondary | ICD-10-CM | POA: Diagnosis not present

## 2015-06-30 DIAGNOSIS — I10 Essential (primary) hypertension: Secondary | ICD-10-CM | POA: Diagnosis not present

## 2015-06-30 DIAGNOSIS — E119 Type 2 diabetes mellitus without complications: Secondary | ICD-10-CM | POA: Diagnosis not present

## 2015-06-30 DIAGNOSIS — Z471 Aftercare following joint replacement surgery: Secondary | ICD-10-CM | POA: Diagnosis not present

## 2015-06-30 DIAGNOSIS — M6281 Muscle weakness (generalized): Secondary | ICD-10-CM | POA: Diagnosis not present

## 2015-06-30 DIAGNOSIS — I2510 Atherosclerotic heart disease of native coronary artery without angina pectoris: Secondary | ICD-10-CM | POA: Diagnosis not present

## 2015-07-01 DIAGNOSIS — E119 Type 2 diabetes mellitus without complications: Secondary | ICD-10-CM | POA: Diagnosis not present

## 2015-07-01 DIAGNOSIS — M1990 Unspecified osteoarthritis, unspecified site: Secondary | ICD-10-CM | POA: Diagnosis not present

## 2015-07-01 DIAGNOSIS — Z471 Aftercare following joint replacement surgery: Secondary | ICD-10-CM | POA: Diagnosis not present

## 2015-07-01 DIAGNOSIS — M6281 Muscle weakness (generalized): Secondary | ICD-10-CM | POA: Diagnosis not present

## 2015-07-01 DIAGNOSIS — I2510 Atherosclerotic heart disease of native coronary artery without angina pectoris: Secondary | ICD-10-CM | POA: Diagnosis not present

## 2015-07-01 DIAGNOSIS — I10 Essential (primary) hypertension: Secondary | ICD-10-CM | POA: Diagnosis not present

## 2015-07-02 DIAGNOSIS — E119 Type 2 diabetes mellitus without complications: Secondary | ICD-10-CM | POA: Diagnosis not present

## 2015-07-02 DIAGNOSIS — I2510 Atherosclerotic heart disease of native coronary artery without angina pectoris: Secondary | ICD-10-CM | POA: Diagnosis not present

## 2015-07-02 DIAGNOSIS — I10 Essential (primary) hypertension: Secondary | ICD-10-CM | POA: Diagnosis not present

## 2015-07-02 DIAGNOSIS — M6281 Muscle weakness (generalized): Secondary | ICD-10-CM | POA: Diagnosis not present

## 2015-07-02 DIAGNOSIS — M1990 Unspecified osteoarthritis, unspecified site: Secondary | ICD-10-CM | POA: Diagnosis not present

## 2015-07-02 DIAGNOSIS — Z471 Aftercare following joint replacement surgery: Secondary | ICD-10-CM | POA: Diagnosis not present

## 2015-07-04 DIAGNOSIS — I10 Essential (primary) hypertension: Secondary | ICD-10-CM | POA: Diagnosis not present

## 2015-07-04 DIAGNOSIS — M6281 Muscle weakness (generalized): Secondary | ICD-10-CM | POA: Diagnosis not present

## 2015-07-04 DIAGNOSIS — E119 Type 2 diabetes mellitus without complications: Secondary | ICD-10-CM | POA: Diagnosis not present

## 2015-07-04 DIAGNOSIS — Z471 Aftercare following joint replacement surgery: Secondary | ICD-10-CM | POA: Diagnosis not present

## 2015-07-04 DIAGNOSIS — I2510 Atherosclerotic heart disease of native coronary artery without angina pectoris: Secondary | ICD-10-CM | POA: Diagnosis not present

## 2015-07-04 DIAGNOSIS — M1990 Unspecified osteoarthritis, unspecified site: Secondary | ICD-10-CM | POA: Diagnosis not present

## 2015-07-07 DIAGNOSIS — I2510 Atherosclerotic heart disease of native coronary artery without angina pectoris: Secondary | ICD-10-CM | POA: Diagnosis not present

## 2015-07-07 DIAGNOSIS — Z471 Aftercare following joint replacement surgery: Secondary | ICD-10-CM | POA: Diagnosis not present

## 2015-07-07 DIAGNOSIS — M6281 Muscle weakness (generalized): Secondary | ICD-10-CM | POA: Diagnosis not present

## 2015-07-07 DIAGNOSIS — M1990 Unspecified osteoarthritis, unspecified site: Secondary | ICD-10-CM | POA: Diagnosis not present

## 2015-07-07 DIAGNOSIS — E119 Type 2 diabetes mellitus without complications: Secondary | ICD-10-CM | POA: Diagnosis not present

## 2015-07-07 DIAGNOSIS — I10 Essential (primary) hypertension: Secondary | ICD-10-CM | POA: Diagnosis not present

## 2015-07-08 DIAGNOSIS — M1990 Unspecified osteoarthritis, unspecified site: Secondary | ICD-10-CM | POA: Diagnosis not present

## 2015-07-08 DIAGNOSIS — M6281 Muscle weakness (generalized): Secondary | ICD-10-CM | POA: Diagnosis not present

## 2015-07-08 DIAGNOSIS — I10 Essential (primary) hypertension: Secondary | ICD-10-CM | POA: Diagnosis not present

## 2015-07-08 DIAGNOSIS — I2510 Atherosclerotic heart disease of native coronary artery without angina pectoris: Secondary | ICD-10-CM | POA: Diagnosis not present

## 2015-07-08 DIAGNOSIS — E119 Type 2 diabetes mellitus without complications: Secondary | ICD-10-CM | POA: Diagnosis not present

## 2015-07-08 DIAGNOSIS — Z471 Aftercare following joint replacement surgery: Secondary | ICD-10-CM | POA: Diagnosis not present

## 2015-07-09 DIAGNOSIS — Z471 Aftercare following joint replacement surgery: Secondary | ICD-10-CM | POA: Diagnosis not present

## 2015-07-09 DIAGNOSIS — M6281 Muscle weakness (generalized): Secondary | ICD-10-CM | POA: Diagnosis not present

## 2015-07-09 DIAGNOSIS — M1990 Unspecified osteoarthritis, unspecified site: Secondary | ICD-10-CM | POA: Diagnosis not present

## 2015-07-09 DIAGNOSIS — E119 Type 2 diabetes mellitus without complications: Secondary | ICD-10-CM | POA: Diagnosis not present

## 2015-07-09 DIAGNOSIS — I2510 Atherosclerotic heart disease of native coronary artery without angina pectoris: Secondary | ICD-10-CM | POA: Diagnosis not present

## 2015-07-09 DIAGNOSIS — I10 Essential (primary) hypertension: Secondary | ICD-10-CM | POA: Diagnosis not present

## 2015-07-11 DIAGNOSIS — M1990 Unspecified osteoarthritis, unspecified site: Secondary | ICD-10-CM | POA: Diagnosis not present

## 2015-07-11 DIAGNOSIS — I10 Essential (primary) hypertension: Secondary | ICD-10-CM | POA: Diagnosis not present

## 2015-07-11 DIAGNOSIS — M6281 Muscle weakness (generalized): Secondary | ICD-10-CM | POA: Diagnosis not present

## 2015-07-11 DIAGNOSIS — I2510 Atherosclerotic heart disease of native coronary artery without angina pectoris: Secondary | ICD-10-CM | POA: Diagnosis not present

## 2015-07-11 DIAGNOSIS — E119 Type 2 diabetes mellitus without complications: Secondary | ICD-10-CM | POA: Diagnosis not present

## 2015-07-11 DIAGNOSIS — Z471 Aftercare following joint replacement surgery: Secondary | ICD-10-CM | POA: Diagnosis not present

## 2015-07-14 DIAGNOSIS — I10 Essential (primary) hypertension: Secondary | ICD-10-CM | POA: Diagnosis not present

## 2015-07-14 DIAGNOSIS — M6281 Muscle weakness (generalized): Secondary | ICD-10-CM | POA: Diagnosis not present

## 2015-07-14 DIAGNOSIS — E119 Type 2 diabetes mellitus without complications: Secondary | ICD-10-CM | POA: Diagnosis not present

## 2015-07-14 DIAGNOSIS — M1990 Unspecified osteoarthritis, unspecified site: Secondary | ICD-10-CM | POA: Diagnosis not present

## 2015-07-14 DIAGNOSIS — Z471 Aftercare following joint replacement surgery: Secondary | ICD-10-CM | POA: Diagnosis not present

## 2015-07-14 DIAGNOSIS — I2510 Atherosclerotic heart disease of native coronary artery without angina pectoris: Secondary | ICD-10-CM | POA: Diagnosis not present

## 2015-07-15 DIAGNOSIS — Z471 Aftercare following joint replacement surgery: Secondary | ICD-10-CM | POA: Diagnosis not present

## 2015-07-15 DIAGNOSIS — Z96653 Presence of artificial knee joint, bilateral: Secondary | ICD-10-CM | POA: Diagnosis not present

## 2015-07-16 DIAGNOSIS — I10 Essential (primary) hypertension: Secondary | ICD-10-CM | POA: Diagnosis not present

## 2015-07-16 DIAGNOSIS — M1990 Unspecified osteoarthritis, unspecified site: Secondary | ICD-10-CM | POA: Diagnosis not present

## 2015-07-16 DIAGNOSIS — E119 Type 2 diabetes mellitus without complications: Secondary | ICD-10-CM | POA: Diagnosis not present

## 2015-07-16 DIAGNOSIS — M6281 Muscle weakness (generalized): Secondary | ICD-10-CM | POA: Diagnosis not present

## 2015-07-16 DIAGNOSIS — Z471 Aftercare following joint replacement surgery: Secondary | ICD-10-CM | POA: Diagnosis not present

## 2015-07-16 DIAGNOSIS — I2510 Atherosclerotic heart disease of native coronary artery without angina pectoris: Secondary | ICD-10-CM | POA: Diagnosis not present

## 2015-07-17 DIAGNOSIS — M6281 Muscle weakness (generalized): Secondary | ICD-10-CM | POA: Diagnosis not present

## 2015-07-17 DIAGNOSIS — I2510 Atherosclerotic heart disease of native coronary artery without angina pectoris: Secondary | ICD-10-CM | POA: Diagnosis not present

## 2015-07-17 DIAGNOSIS — I10 Essential (primary) hypertension: Secondary | ICD-10-CM | POA: Diagnosis not present

## 2015-07-17 DIAGNOSIS — E119 Type 2 diabetes mellitus without complications: Secondary | ICD-10-CM | POA: Diagnosis not present

## 2015-07-17 DIAGNOSIS — M1990 Unspecified osteoarthritis, unspecified site: Secondary | ICD-10-CM | POA: Diagnosis not present

## 2015-07-17 DIAGNOSIS — Z471 Aftercare following joint replacement surgery: Secondary | ICD-10-CM | POA: Diagnosis not present

## 2015-07-21 DIAGNOSIS — Z471 Aftercare following joint replacement surgery: Secondary | ICD-10-CM | POA: Diagnosis not present

## 2015-07-21 DIAGNOSIS — Z96653 Presence of artificial knee joint, bilateral: Secondary | ICD-10-CM | POA: Diagnosis not present

## 2015-07-23 DIAGNOSIS — Z96653 Presence of artificial knee joint, bilateral: Secondary | ICD-10-CM | POA: Diagnosis not present

## 2015-07-23 DIAGNOSIS — Z471 Aftercare following joint replacement surgery: Secondary | ICD-10-CM | POA: Diagnosis not present

## 2015-07-28 DIAGNOSIS — Z471 Aftercare following joint replacement surgery: Secondary | ICD-10-CM | POA: Diagnosis not present

## 2015-07-28 DIAGNOSIS — Z96653 Presence of artificial knee joint, bilateral: Secondary | ICD-10-CM | POA: Diagnosis not present

## 2015-07-30 DIAGNOSIS — Z471 Aftercare following joint replacement surgery: Secondary | ICD-10-CM | POA: Diagnosis not present

## 2015-07-30 DIAGNOSIS — Z96653 Presence of artificial knee joint, bilateral: Secondary | ICD-10-CM | POA: Diagnosis not present

## 2015-08-01 DIAGNOSIS — Z471 Aftercare following joint replacement surgery: Secondary | ICD-10-CM | POA: Diagnosis not present

## 2015-08-01 DIAGNOSIS — Z96653 Presence of artificial knee joint, bilateral: Secondary | ICD-10-CM | POA: Diagnosis not present

## 2015-08-04 DIAGNOSIS — Z96653 Presence of artificial knee joint, bilateral: Secondary | ICD-10-CM | POA: Diagnosis not present

## 2015-08-04 DIAGNOSIS — Z471 Aftercare following joint replacement surgery: Secondary | ICD-10-CM | POA: Diagnosis not present

## 2015-08-06 DIAGNOSIS — Z471 Aftercare following joint replacement surgery: Secondary | ICD-10-CM | POA: Diagnosis not present

## 2015-08-06 DIAGNOSIS — Z96653 Presence of artificial knee joint, bilateral: Secondary | ICD-10-CM | POA: Diagnosis not present

## 2015-08-08 DIAGNOSIS — Z471 Aftercare following joint replacement surgery: Secondary | ICD-10-CM | POA: Diagnosis not present

## 2015-08-08 DIAGNOSIS — Z96653 Presence of artificial knee joint, bilateral: Secondary | ICD-10-CM | POA: Diagnosis not present

## 2015-08-11 DIAGNOSIS — Z471 Aftercare following joint replacement surgery: Secondary | ICD-10-CM | POA: Diagnosis not present

## 2015-08-11 DIAGNOSIS — Z96653 Presence of artificial knee joint, bilateral: Secondary | ICD-10-CM | POA: Diagnosis not present

## 2015-08-13 DIAGNOSIS — Z471 Aftercare following joint replacement surgery: Secondary | ICD-10-CM | POA: Diagnosis not present

## 2015-08-13 DIAGNOSIS — Z96653 Presence of artificial knee joint, bilateral: Secondary | ICD-10-CM | POA: Diagnosis not present

## 2015-08-21 DIAGNOSIS — Z471 Aftercare following joint replacement surgery: Secondary | ICD-10-CM | POA: Diagnosis not present

## 2015-08-21 DIAGNOSIS — Z96653 Presence of artificial knee joint, bilateral: Secondary | ICD-10-CM | POA: Diagnosis not present

## 2015-09-08 DIAGNOSIS — R300 Dysuria: Secondary | ICD-10-CM | POA: Diagnosis not present

## 2015-09-08 DIAGNOSIS — Z01419 Encounter for gynecological examination (general) (routine) without abnormal findings: Secondary | ICD-10-CM | POA: Diagnosis not present

## 2015-09-18 DIAGNOSIS — Z23 Encounter for immunization: Secondary | ICD-10-CM | POA: Diagnosis not present

## 2015-10-20 ENCOUNTER — Encounter: Payer: Self-pay | Admitting: Family Medicine

## 2015-10-20 ENCOUNTER — Ambulatory Visit (INDEPENDENT_AMBULATORY_CARE_PROVIDER_SITE_OTHER): Payer: Medicare Other | Admitting: Family Medicine

## 2015-10-20 VITALS — BP 122/72 | HR 76 | Temp 97.6°F | Resp 16 | Ht 65.0 in | Wt 152.0 lb

## 2015-10-20 DIAGNOSIS — E1159 Type 2 diabetes mellitus with other circulatory complications: Secondary | ICD-10-CM | POA: Insufficient documentation

## 2015-10-20 DIAGNOSIS — I10 Essential (primary) hypertension: Secondary | ICD-10-CM | POA: Diagnosis not present

## 2015-10-20 DIAGNOSIS — L659 Nonscarring hair loss, unspecified: Secondary | ICD-10-CM | POA: Insufficient documentation

## 2015-10-20 DIAGNOSIS — E039 Hypothyroidism, unspecified: Secondary | ICD-10-CM | POA: Diagnosis not present

## 2015-10-20 DIAGNOSIS — O24419 Gestational diabetes mellitus in pregnancy, unspecified control: Secondary | ICD-10-CM | POA: Insufficient documentation

## 2015-10-20 DIAGNOSIS — L409 Psoriasis, unspecified: Secondary | ICD-10-CM | POA: Insufficient documentation

## 2015-10-20 DIAGNOSIS — E119 Type 2 diabetes mellitus without complications: Secondary | ICD-10-CM

## 2015-10-20 DIAGNOSIS — T148XXA Other injury of unspecified body region, initial encounter: Secondary | ICD-10-CM | POA: Insufficient documentation

## 2015-10-20 DIAGNOSIS — S83209A Unspecified tear of unspecified meniscus, current injury, unspecified knee, initial encounter: Secondary | ICD-10-CM | POA: Insufficient documentation

## 2015-10-20 DIAGNOSIS — I2510 Atherosclerotic heart disease of native coronary artery without angina pectoris: Secondary | ICD-10-CM | POA: Insufficient documentation

## 2015-10-20 DIAGNOSIS — E7800 Pure hypercholesterolemia, unspecified: Secondary | ICD-10-CM

## 2015-10-20 DIAGNOSIS — Z96653 Presence of artificial knee joint, bilateral: Secondary | ICD-10-CM

## 2015-10-20 DIAGNOSIS — K644 Residual hemorrhoidal skin tags: Secondary | ICD-10-CM | POA: Insufficient documentation

## 2015-10-20 LAB — POCT GLYCOSYLATED HEMOGLOBIN (HGB A1C): Hemoglobin A1C: 6.9

## 2015-10-20 NOTE — Progress Notes (Signed)
Patient ID: Renee [name], female   DOB: May 07, 1946, 69 y.o.   MRN: [id number]       [name] Female    DOB: [birth year]   69 y.o.   MRN: [id number] Visit Date: 10/20/2015  Today's Provider: Margarita Rana, MD   Chief Complaint  Patient presents with  . Hypertension  . Diabetes  . Hypothyroidism  . Hyperlipidemia   Subjective:    Hypertension This is a chronic problem. The problem is unchanged. The problem is controlled. Pertinent negatives include no chest pain or shortness of breath. There are no compliance problems.  Hypertensive end-organ damage includes a thyroid problem.  Diabetes She presents for her follow-up diabetic visit. She has type 2 diabetes mellitus. Her disease course has been stable. There are no hypoglycemic associated symptoms. There are no diabetic associated symptoms. Pertinent negatives for diabetes include no chest pain. There are no hypoglycemic complications. Symptoms are stable. There are no diabetic complications. When asked about current treatments, none were reported.  Hyperlipidemia This is a chronic problem. The problem is uncontrolled. Recent lipid tests were reviewed and are variable. Exacerbating diseases include diabetes and hypothyroidism. Pertinent negatives include no chest pain, focal sensory loss, focal weakness, leg pain, myalgias or shortness of breath. She is currently on no antihyperlipidemic treatment.  Thyroid Problem Presents for follow-up visit. Symptoms include hair loss. Her past medical history is significant for diabetes and hyperlipidemia.  Patient reports that her hair is coming out in "clumps". She thinks that her thyroid levels could be off.      Allergies  Allergen Reactions  . Other Rash    Metal   . Codeine Nausea And Vomiting  . Metformin Nausea And Vomiting  . Simvastatin Swelling and Rash   Previous Medications   ACETAMINOPHEN (TYLENOL) 500 MG TABLET    Take 1,000 mg by mouth every 6 (six) hours as needed  for moderate pain or headache.   ALPRAZOLAM (XANAX) 1 MG TABLET    Take 1 tablet (1 mg total) by mouth daily as needed for anxiety.   BISACODYL (DULCOLAX) 10 MG SUPPOSITORY    Place 1 suppository (10 mg total) rectally daily as needed for moderate constipation.   DIPHENHYDRAMINE (BENADRYL) 25 MG TABLET    Take 1 tablet (25 mg total) by mouth at bedtime as needed for sleep.   DOCUSATE SODIUM (COLACE) 100 MG CAPSULE    Take 1 capsule (100 mg total) by mouth 2 (two) times daily.   EMOLLIENT (CERAVE) CREA    Apply 1 application topically daily as needed (psoriasis).    ENOXAPARIN (LOVENOX) 30 MG/0.3ML INJECTION    Inject 0.3 mLs (30 mg total) into the skin daily. Continue Lovenox injections until the INR is therapeutic at or greater than 2.0.  When INR reaches the therapeutic level of equal to or greater than 2.0, the patient may discontinue the Lovenox injections.   FLUOCINONIDE (LIDEX) 0.05 % CREAM    Apply 1 application topically at bedtime.    GEMFIBROZIL (LOPID) 600 MG TABLET    Take 600 mg by mouth every morning.    HYDROMORPHONE (DILAUDID) 2 MG TABLET    Take 1-3 tablets (2-6 mg total) by mouth every 4 (four) hours as needed for moderate pain or severe pain.   LEVOTHYROXINE (SYNTHROID, LEVOTHROID) 75 MCG TABLET    Take 75 mcg by mouth every morning.    LISINOPRIL (PRINIVIL,ZESTRIL) 20 MG TABLET    Take 20 mg by mouth every morning.  METHOCARBAMOL (ROBAXIN) 500 MG TABLET    Take 1 tablet (500 mg total) by mouth every 6 (six) hours as needed for muscle spasms.   METOCLOPRAMIDE (REGLAN) 5 MG TABLET    Take 1 tablet (5 mg total) by mouth every 8 (eight) hours as needed for nausea (if ondansetron (ZOFRAN) ineffective.).   NITROGLYCERIN (NITROSTAT) 0.4 MG SL TABLET    Place 1 tablet (0.4 mg total) under the tongue every 5 (five) minutes as needed.   ONDANSETRON (ZOFRAN) 4 MG TABLET    Take 1 tablet (4 mg total) by mouth every 6 (six) hours as needed for nausea.   POLYETHYLENE GLYCOL (MIRALAX /  GLYCOLAX) PACKET    Take 17 g by mouth daily as needed for mild constipation.   TRAMADOL (ULTRAM) 50 MG TABLET    Take 1-2 tablets (50-100 mg total) by mouth every 6 (six) hours as needed (mld pain).   TRIAMCINOLONE (KENALOG) 0.1 % CREAM    Apply 1 application topically at bedtime.    WARFARIN (COUMADIN) 5 MG TABLET    Take Coumadin 7 mg for a total of four weeks and then discontinue.  The dose may need to be adjusted based upon the INR.  Please follow the INR and titrate Coumadin dose for a therapeutic range between 2.0 and 3.0 INR.  After completing the four weeks of Coumadin, the patient may stop the Coumadin and resume their 81 mg Aspirin daily.    Review of Systems  Constitutional: Negative.   Respiratory: Negative.  Negative for shortness of breath.   Cardiovascular: Negative.  Negative for chest pain.  Gastrointestinal: Negative.   Endocrine: Negative.   Musculoskeletal: Negative.  Negative for myalgias.  Neurological: Negative.  Negative for focal weakness.  Psychiatric/Behavioral: Negative.     Social History  Substance Use Topics  . Smoking status: Never Smoker   . Smokeless tobacco: Never Used  . Alcohol Use: Yes     Comment: very little around holidays   Objective:   BP 122/72 mmHg  Pulse 76  Temp(Src) 97.6 F (36.4 C)  Resp 16  Ht 5\' 5"  (1.651 m)  Wt 152 lb (68.947 kg)  BMI 25.29 kg/m2  Physical Exam  Constitutional: She is oriented to person, place, and time. She appears well-developed and well-nourished.  Cardiovascular: Normal rate, regular rhythm, normal heart sounds and intact distal pulses.   Neurological: She is alert and oriented to person, place, and time. She has normal reflexes.  Skin: Skin is warm and dry.  Bald spot in circular lesion on her scalp.   Psychiatric: She has a normal mood and affect. Her behavior is normal. Judgment and thought content normal.  Nursing note and vitals reviewed.     Assessment & Plan:  1. Type 2 diabetes mellitus  without complication, without long-term current use of insulin (HCC) BLood sugar is 6.9 today.  Will increase walking now that her knees are better status post bilateral knee replacements. Recheck in 3 months.  For other medical problems listed below, continue current medication and plan of care and recheck labs as noted.   - POCT HgB A1C   2. Hypothyroidism, unspecified hypothyroidism type Stable. Will check labs.   3. Hypercholesteremia Stable. Will check labs.   - Lipid panel - Comprehensive metabolic panel  4. Essential hypertension Stable. Will check labs.  - TSH - CBC with Differential/Platelet  5. History of bilateral knee replacement Stable. Doing remarkably well.     6. Alopecia New problem .Will refer.   -  Ambulatory referral to Dermatology      Margarita Rana, MD  [facility]

## 2015-10-21 ENCOUNTER — Telehealth: Payer: Self-pay

## 2015-10-21 LAB — LIPID PANEL
Chol/HDL Ratio: 3.6 ratio units (ref 0.0–4.4)
Cholesterol, Total: 168 mg/dL (ref 100–199)
HDL: 47 mg/dL (ref >39)
LDL Calculated: 102 mg/dL — ABNORMAL HIGH (ref 0–99)
Triglycerides: 93 mg/dL (ref 0–149)
VLDL CHOLESTEROL CAL: 19 mg/dL (ref 5–40)

## 2015-10-21 LAB — CBC WITH DIFFERENTIAL/PLATELET
Basophils Absolute: 0 10*3/uL (ref 0.0–0.2)
Basos: 1 %
EOS (ABSOLUTE): 0.2 10*3/uL (ref 0.0–0.4)
EOS: 3 %
HEMATOCRIT: 40.4 % (ref 34.0–46.6)
HEMOGLOBIN: 13.1 g/dL (ref 11.1–15.9)
IMMATURE GRANS (ABS): 0 10*3/uL (ref 0.0–0.1)
Immature Granulocytes: 1 %
LYMPHS: 33 %
Lymphocytes Absolute: 2.1 10*3/uL (ref 0.7–3.1)
MCH: 29.6 pg (ref 26.6–33.0)
MCHC: 32.4 g/dL (ref 31.5–35.7)
MCV: 91 fL (ref 79–97)
MONOCYTES: 10 %
Monocytes Absolute: 0.6 10*3/uL (ref 0.1–0.9)
NEUTROS PCT: 52 %
Neutrophils Absolute: 3.4 10*3/uL (ref 1.4–7.0)
Platelets: 464 10*3/uL — ABNORMAL HIGH (ref 150–379)
RBC: 4.43 x10E6/uL (ref 3.77–5.28)
RDW: 14 % (ref 12.3–15.4)
WBC: 6.3 10*3/uL (ref 3.4–10.8)

## 2015-10-21 LAB — COMPREHENSIVE METABOLIC PANEL
ALK PHOS: 87 IU/L (ref 39–117)
ALT: 16 IU/L (ref 0–32)
AST: 20 IU/L (ref 0–40)
Albumin/Globulin Ratio: 1.4 (ref 1.1–2.5)
Albumin: 4.9 g/dL — ABNORMAL HIGH (ref 3.5–4.8)
BUN/Creatinine Ratio: 26 (ref 11–26)
BUN: 18 mg/dL (ref 8–27)
Bilirubin Total: 0.2 mg/dL (ref 0.0–1.2)
CO2: 23 mmol/L (ref 18–29)
CREATININE: 0.68 mg/dL (ref 0.57–1.00)
Calcium: 10.7 mg/dL — ABNORMAL HIGH (ref 8.7–10.3)
Chloride: 100 mmol/L (ref 97–106)
GFR calc Af Amer: 102 mL/min/{1.73_m2} (ref >59)
GFR calc non Af Amer: 88 mL/min/{1.73_m2} (ref >59)
GLUCOSE: 126 mg/dL — AB (ref 65–99)
Globulin, Total: 3.4 g/dL (ref 1.5–4.5)
Potassium: 5.3 mmol/L — ABNORMAL HIGH (ref 3.5–5.2)
Sodium: 145 mmol/L — ABNORMAL HIGH (ref 136–144)
Total Protein: 8.3 g/dL (ref 6.0–8.5)

## 2015-10-21 LAB — TSH: TSH: 1.62 u[IU]/mL (ref 0.450–4.500)

## 2015-10-21 LAB — POCT GLYCOSYLATED HEMOGLOBIN (HGB A1C): Hemoglobin A1C: 6.9

## 2015-10-21 NOTE — Addendum Note (Signed)
Addended by: Luvenia Heller on: 10/21/2015 02:17 PM   Modules accepted: Orders

## 2015-10-21 NOTE — Telephone Encounter (Signed)
Pt requested the lab results. Pt was advised as directed in the labs results. Pt verbalized fully understanding.

## 2015-11-05 DIAGNOSIS — L401 Generalized pustular psoriasis: Secondary | ICD-10-CM | POA: Diagnosis not present

## 2015-11-05 DIAGNOSIS — L638 Other alopecia areata: Secondary | ICD-10-CM | POA: Diagnosis not present

## 2015-11-13 DIAGNOSIS — Z96651 Presence of right artificial knee joint: Secondary | ICD-10-CM | POA: Diagnosis not present

## 2015-11-13 DIAGNOSIS — Z96652 Presence of left artificial knee joint: Secondary | ICD-10-CM | POA: Diagnosis not present

## 2015-11-13 DIAGNOSIS — Z471 Aftercare following joint replacement surgery: Secondary | ICD-10-CM | POA: Diagnosis not present

## 2015-11-13 DIAGNOSIS — Z96653 Presence of artificial knee joint, bilateral: Secondary | ICD-10-CM | POA: Diagnosis not present

## 2015-11-27 ENCOUNTER — Ambulatory Visit (INDEPENDENT_AMBULATORY_CARE_PROVIDER_SITE_OTHER): Payer: Medicare Other | Admitting: Cardiovascular Disease

## 2015-11-27 ENCOUNTER — Telehealth: Payer: Self-pay | Admitting: Cardiovascular Disease

## 2015-11-27 ENCOUNTER — Encounter: Payer: Self-pay | Admitting: Cardiovascular Disease

## 2015-11-27 VITALS — BP 140/62 | HR 70 | Ht 64.0 in | Wt 154.5 lb

## 2015-11-27 DIAGNOSIS — L659 Nonscarring hair loss, unspecified: Secondary | ICD-10-CM

## 2015-11-27 DIAGNOSIS — E785 Hyperlipidemia, unspecified: Secondary | ICD-10-CM

## 2015-11-27 DIAGNOSIS — Z96653 Presence of artificial knee joint, bilateral: Secondary | ICD-10-CM

## 2015-11-27 DIAGNOSIS — E119 Type 2 diabetes mellitus without complications: Secondary | ICD-10-CM | POA: Diagnosis not present

## 2015-11-27 DIAGNOSIS — I2510 Atherosclerotic heart disease of native coronary artery without angina pectoris: Secondary | ICD-10-CM

## 2015-11-27 DIAGNOSIS — I10 Essential (primary) hypertension: Secondary | ICD-10-CM | POA: Diagnosis not present

## 2015-11-27 MED ORDER — EZETIMIBE 10 MG PO TABS: 10.0000 mg | ORAL_TABLET | Freq: Every day | ORAL | Status: DC

## 2015-11-27 NOTE — Assessment & Plan Note (Signed)
Long discussion concerning her cholesterol, underlying coronary artery disease and need for aggressive cholesterol management. Recommended she start Zetia once per day in addition to her gemfibrozil

## 2015-11-27 NOTE — Progress Notes (Signed)
Patient ID: Renee [name], female    DOB: Jan 20, 1946, 69 y.o.   MRN: [id number]  HPI Comments: [name] is a very pleasant 69 year old woman with a history of hypertension, hyperlipidemia, and a long history of diabetes.  Cardiac catheterization October 2011 for chest pain showed mild to moderate non-obstructive CAD. 2010  With  EF of 55% to 60%.   40-50% LAD disease, 60% ostial diagonal disease, 40% PDA She presents today for follow-up of her coronary artery disease  In follow-up, she has had 2 knee replacements 6 months ago in [place] Recovered well, still some gait instability Otherwise reports that she is doing well Lab work reviewed with her showing hemoglobin A1c 6.9 Total cholesterol 168, LDL 102 No regular exercise program but does try to stay active  She is unable to tolerate statins, tolerating gemfibrozil Trying to control diabetes through diet alone  EKG on today's visit shows normal sinus rhythm with rate 70 bpm, no significant ST or T-wave changes  Previous significant reaction to statins, simvastatin may have caused a rash.     Allergies  Allergen Reactions  . Other Rash    Metal   . Codeine Nausea And Vomiting  . Metformin Nausea And Vomiting  . Robaxin [Methocarbamol] Nausea Only  . Simvastatin Swelling and Rash    Current Outpatient Prescriptions on File Prior to Visit  Medication Sig Dispense Refill  . acetaminophen (TYLENOL) 500 MG tablet Take 1,000 mg by mouth every 6 (six) hours as needed for moderate pain or headache.    . diphenhydrAMINE (BENADRYL) 25 MG tablet Take 1 tablet (25 mg total) by mouth at bedtime as needed for sleep. 30 tablet 0  . docusate sodium (COLACE) 100 MG capsule Take 1 capsule (100 mg total) by mouth 2 (two) times daily. 10 capsule 0  . Emollient (CERAVE) CREA Apply 1 application topically daily as needed (psoriasis).     . fluocinonide (LIDEX) 0.05 % cream Apply 1 application topically at bedtime.     Marland Kitchen gemfibrozil (LOPID) 600  MG tablet Take 600 mg by mouth every morning.     Marland Kitchen levothyroxine (SYNTHROID, LEVOTHROID) 75 MCG tablet Take 75 mcg by mouth every morning.     Marland Kitchen lisinopril (PRINIVIL,ZESTRIL) 20 MG tablet Take 20 mg by mouth every morning.     . nitroGLYCERIN (NITROSTAT) 0.4 MG SL tablet Place 1 tablet (0.4 mg total) under the tongue every 5 (five) minutes as needed. 30 tablet 6  . polyethylene glycol (MIRALAX / GLYCOLAX) packet Take 17 g by mouth daily as needed for mild constipation. 14 each 0  . traMADol (ULTRAM) 50 MG tablet Take 1-2 tablets (50-100 mg total) by mouth every 6 (six) hours as needed (mld pain). 60 tablet 1  . triamcinolone (KENALOG) 0.1 % cream Apply 1 application topically at bedtime.      No current facility-administered medications on file prior to visit.    Past Medical History  Diagnosis Date  . Hyperlipidemia   . Hypothyroidism   . CAD (coronary artery disease)     non-obstructive. cath 11/11  . Hypertension   . History of phobia     clastrophobia  . Diabetes mellitus     borderline diabetic - diet controlled   . Kidney stones     hx of   . Urinary tract infection     hx of   . Arthritis   . Psoriasis     Past Surgical History  Procedure Laterality Date  . Appendectomy    .  Tubes tied      and untied. and tied again  . Carpal tunnel release      Left trigger finger  . Cardiac catheterization      2011  . Tubal ligation    . Eye surgery      lasik - right eye   . Total knee arthroplasty Bilateral 06/12/2015    Procedure: TOTAL KNEE BILATERAL;  Surgeon: Gaynelle Arabian, MD;  Location: [facility];  Service: Orthopedics;  Laterality: Bilateral;  and epidural    Social History  reports that she has never smoked. She has never used smokeless tobacco. She reports that she drinks alcohol. She reports that she does not use illicit drugs.  Family History Family history is unknown by patient.     Review of Systems  Constitutional: Negative.   Eyes: Negative.    Respiratory: Negative.   Cardiovascular: Negative.   Gastrointestinal: Negative.   Musculoskeletal: Negative.   Neurological: Negative.   Hematological: Negative.   Psychiatric/Behavioral: Negative.   All other systems reviewed and are negative.   BP 140/62 mmHg  Pulse 70  Ht 5\' 4"  (1.626 m)  Wt 154 lb 8 oz (70.081 kg)  BMI 26.51 kg/m2  Physical Exam  Constitutional: She is oriented to person, place, and time. She appears well-developed and well-nourished.  HENT:  Head: Normocephalic.  Nose: Nose normal.  Mouth/Throat: Oropharynx is clear and moist.  Eyes: Conjunctivae are normal. Pupils are equal, round, and reactive to light.  Neck: Normal range of motion. Neck supple. No JVD present.  Cardiovascular: Normal rate, regular rhythm, S1 normal, S2 normal, normal heart sounds and intact distal pulses.  Exam reveals no gallop and no friction rub.   No murmur heard. Pulmonary/Chest: Effort normal and breath sounds normal. No respiratory distress. She has no wheezes. She has no rales. She exhibits no tenderness.  Abdominal: Soft. Bowel sounds are normal. She exhibits no distension. There is no tenderness.  Musculoskeletal: Normal range of motion. She exhibits no edema or tenderness.  Lymphadenopathy:    She has no cervical adenopathy.  Neurological: She is alert and oriented to person, place, and time. Coordination normal.  Skin: Skin is warm and dry. No rash noted. No erythema.  Psychiatric: She has a normal mood and affect. Her behavior is normal. Judgment and thought content normal.    Assessment and Plan  Nursing note and vitals reviewed.

## 2015-11-27 NOTE — Telephone Encounter (Signed)
New message     Patient was seen today by Dr. Rockey Situ - wants to discuss crestor options. Cost $ 200 + for 30 day supply

## 2015-11-27 NOTE — Assessment & Plan Note (Signed)
We have encouraged continued exercise, careful diet management in an effort to lose weight. 

## 2015-11-27 NOTE — Assessment & Plan Note (Signed)
Hair loss in one particular area, she has been seen by dermatology. She is very concerned, wanting to know how to fix her hair. Unclear at this point as it is such a focal region, not diffuse, less likely medication

## 2015-11-27 NOTE — Patient Instructions (Addendum)
You are doing well.  Start zetia one a day for cholesterol  Please call us if you have new issues that need to be addressed before your next appt.  Your physician wants you to follow-up in: 12 months.  You will receive a reminder letter in the mail two months in advance. If you don't receive a letter, please call our office to schedule the follow-up appointment.

## 2015-11-27 NOTE — Assessment & Plan Note (Signed)
Blood pressure is well controlled on today's visit. No changes made to the medications. 

## 2015-11-27 NOTE — Assessment & Plan Note (Signed)
Encouraged her to continue a regular exercise program. Some gait instability noted today

## 2015-11-27 NOTE — Assessment & Plan Note (Signed)
Currently with no symptoms of angina. No further workup at this time. Continue current medication regimen. 

## 2015-11-28 DIAGNOSIS — Z1231 Encounter for screening mammogram for malignant neoplasm of breast: Secondary | ICD-10-CM | POA: Diagnosis not present

## 2015-11-28 NOTE — Telephone Encounter (Signed)
Left message for pt to call back  °

## 2015-11-28 NOTE — Telephone Encounter (Signed)
Crestor is too expensive, even the generic is not affordable for her right now.  She would like to know there is another med you would recommend.

## 2015-11-28 NOTE — Telephone Encounter (Signed)
I am confused, we sent in zetia. This will go generic somewhere between now and March  If she would like low-dose Crestor, that is okay, would check price on www.goodrx.com if she has no insurance, $20 per month. If she has insurance, should be cheaper  Or try Lipitor 10 mg daily

## 2015-12-02 NOTE — Telephone Encounter (Signed)
Pt calling back to say she will like to wait until this Zetia goes generic in march.  She used to take gemfidrozil 600 mg a day and this lowered her levels but at one point it got to low. But she was taking 2 when that happened.  Asking if she can do this  Please advise.

## 2015-12-02 NOTE — Telephone Encounter (Signed)
Pt reports that zetia is too expensive.  She is not going to fill at this time, will wait until it is generic, as cash price is over $300.

## 2015-12-17 DIAGNOSIS — L638 Other alopecia areata: Secondary | ICD-10-CM | POA: Diagnosis not present

## 2015-12-26 ENCOUNTER — Encounter: Payer: Self-pay | Admitting: Family Medicine

## 2016-01-05 ENCOUNTER — Other Ambulatory Visit: Payer: Self-pay | Admitting: Family Medicine

## 2016-01-20 ENCOUNTER — Ambulatory Visit: Payer: Medicare Other | Admitting: Family Medicine

## 2016-01-28 ENCOUNTER — Ambulatory Visit (INDEPENDENT_AMBULATORY_CARE_PROVIDER_SITE_OTHER): Payer: Medicare Other | Admitting: Family Medicine

## 2016-01-28 ENCOUNTER — Encounter: Payer: Self-pay | Admitting: Family Medicine

## 2016-01-28 VITALS — BP 110/64 | HR 76 | Temp 97.6°F | Resp 16 | Ht 64.0 in | Wt 153.0 lb

## 2016-01-28 DIAGNOSIS — I10 Essential (primary) hypertension: Secondary | ICD-10-CM | POA: Diagnosis not present

## 2016-01-28 DIAGNOSIS — E119 Type 2 diabetes mellitus without complications: Secondary | ICD-10-CM

## 2016-01-28 LAB — POCT GLYCOSYLATED HEMOGLOBIN (HGB A1C)
Est. average glucose Bld gHb Est-mCnc: 154
Hemoglobin A1C: 7

## 2016-01-28 NOTE — Progress Notes (Signed)
Patient ID: Renee [name], female   DOB: 02-01-1946, 70 y.o.   MRN: [id number]       [name] Female    DOB: 1946-07-22   70 y.o.   MRN: [id number] Visit Date: 01/28/2016  Today's Provider: Margarita Rana, MD   Chief Complaint  Patient presents with  . Diabetes  . Hypertension   Subjective:    HPI  Diabetes Mellitus Type II, Follow-up:   Lab Results  Component Value Date   HGBA1C 7.0 01/28/2016   HGBA1C 6.9 10/21/2015   HGBA1C 6.9 10/20/2015    Last seen for diabetes 3 months ago.  Management since then includes labs checked. She reports excellent compliance with treatment. She is not having side effects.  Current symptoms include none and have been stable. Home blood sugar records: fasting range: 120-140  Episodes of hypoglycemia? no   Current Insulin Regimen:  Most Recent Eye Exam: UTD Weight trend: stable Prior visit with dietician: no Current diet: in general, a "healthy" diet   Current exercise: none  Pertinent Labs:    Component Value Date/Time   CHOL 168 10/20/2015 1241   CHOL  10/26/2010 0631    134        ATP III CLASSIFICATION:  <200     mg/dL   Desirable  200-239  mg/dL   Borderline High  >=240    mg/dL   High          TRIG 93 10/20/2015 1241   HDL 47 10/20/2015 1241   HDL 41 10/26/2010 0631   LDLCALC 102* 10/20/2015 1241   LDLCALC  10/26/2010 0631    77        Total Cholesterol/HDL:CHD Risk Coronary Heart Disease Risk Table                     Men   Women  1/2 Average Risk   3.4   3.3  Average Risk       5.0   4.4  2 X Average Risk   9.6   7.1  3 X Average Risk  23.4   11.0        Use the calculated Patient Ratio above and the CHD Risk Table to determine the patient's CHD Risk.        ATP III CLASSIFICATION (LDL):  <100     mg/dL   Optimal  100-129  mg/dL   Near or Above                    Optimal  130-159  mg/dL   Borderline  160-189  mg/dL   High  >190     mg/dL   Very High   CREATININE 0.68 10/20/2015 1241    Wt  Readings from Last 3 Encounters:  01/28/16 153 lb (69.4 kg)  11/27/15 154 lb 8 oz (70.081 kg)  10/20/15 152 lb (68.947 kg)   ------------------------------------------------------------------------   Hypertension, follow-up:  BP Readings from Last 3 Encounters:  01/28/16 110/64  11/27/15 140/62  10/20/15 122/72    She was last seen for hypertension 3 months ago.  BP at that visit was 122/72. Management changes since that visit include labs checked. She reports excellent compliance with treatment. She is not having side effects.  She is not exercising. She is adherent to low salt diet.   Outside blood pressures are stable. She is experiencing none.  Patient denies chest pain.   Cardiovascular risk factors include diabetes mellitus.  Use of agents associated with hypertension: none.     ------------------------------------------------------------------------     Allergies  Allergen Reactions  . Other Rash    Metal   . Codeine Nausea And Vomiting  . Metformin Nausea And Vomiting  . Robaxin [Methocarbamol] Nausea Only  . Simvastatin Swelling and Rash   Previous Medications   ACETAMINOPHEN (TYLENOL) 500 MG TABLET    Take 1,000 mg by mouth every 6 (six) hours as needed for moderate pain or headache.   DIPHENHYDRAMINE (BENADRYL) 25 MG TABLET    Take 1 tablet (25 mg total) by mouth at bedtime as needed for sleep.   EMOLLIENT (CERAVE) CREA    Apply 1 application topically daily as needed (psoriasis).    FLUOCINONIDE (LIDEX) 0.05 % CREAM    Apply 1 application topically at bedtime.    GEMFIBROZIL (LOPID) 600 MG TABLET    Take 600 mg by mouth every morning.    LEVOTHYROXINE (SYNTHROID, LEVOTHROID) 75 MCG TABLET    TAKE 1 TABLET EVERY DAY   LISINOPRIL (PRINIVIL,ZESTRIL) 20 MG TABLET    TAKE 1 TABLET EVERY DAY   NITROGLYCERIN (NITROSTAT) 0.4 MG SL TABLET    Place 1 tablet (0.4 mg total) under the tongue every 5 (five) minutes as needed.   TRIAMCINOLONE (KENALOG) 0.1 % CREAM     Apply 1 application topically at bedtime.    VITAMIN D, CHOLECALCIFEROL, PO    Take 2,000 Units by mouth daily.     Review of Systems  Constitutional: Negative.   Cardiovascular: Negative.   Endocrine: Negative.     Social History  Substance Use Topics  . Smoking status: Never Smoker   . Smokeless tobacco: Never Used  . Alcohol Use: Yes     Comment: very little around holidays   Objective:   BP 110/64 mmHg  Pulse 76  Temp(Src) 97.6 F (36.4 C) (Oral)  Resp 16  Ht 5\' 4"  (1.626 m)  Wt 153 lb (69.4 kg)  BMI 26.25 kg/m2  SpO2 98%  Physical Exam  Constitutional: She appears well-developed and well-nourished.  Cardiovascular: Normal rate, regular rhythm and normal heart sounds.   Pulmonary/Chest: Effort normal and breath sounds normal.  Skin: Skin is warm and dry.  Psychiatric: She has a normal mood and affect. Her behavior is normal. Judgment and thought content normal.        Assessment & Plan:     1. Diabetes mellitus without complication (HCC) Stable. Patient advised to continue working on diet and exercise. Follow in 3-4 months. - POCT glycosylated hemoglobin (Hb A1C)  2. Essential hypertension F/U pending lab report. - Comprehensive metabolic panel      Patient seen and examined by Dr. Jerrell Belfast, and note scribed by Philbert Riser. Dimas, CMA.  I have reviewed the document for accuracy and completeness and I agree with above. - Jerrell Belfast, MD     Margarita Rana, MD  [facility]

## 2016-01-29 ENCOUNTER — Other Ambulatory Visit: Payer: Self-pay

## 2016-01-29 ENCOUNTER — Telehealth: Payer: Self-pay

## 2016-01-29 ENCOUNTER — Telehealth: Payer: Self-pay | Admitting: Family Medicine

## 2016-01-29 DIAGNOSIS — E119 Type 2 diabetes mellitus without complications: Secondary | ICD-10-CM

## 2016-01-29 DIAGNOSIS — E7800 Pure hypercholesterolemia, unspecified: Secondary | ICD-10-CM

## 2016-01-29 LAB — COMPREHENSIVE METABOLIC PANEL
A/G RATIO: 1.8 (ref 1.1–2.5)
ALK PHOS: 80 IU/L (ref 39–117)
ALT: 15 IU/L (ref 0–32)
AST: 24 IU/L (ref 0–40)
Albumin: 4.9 g/dL — ABNORMAL HIGH (ref 3.5–4.8)
BILIRUBIN TOTAL: 0.3 mg/dL (ref 0.0–1.2)
BUN/Creatinine Ratio: 23 (ref 11–26)
BUN: 17 mg/dL (ref 8–27)
CHLORIDE: 102 mmol/L (ref 96–106)
CO2: 25 mmol/L (ref 18–29)
Calcium: 10.1 mg/dL (ref 8.7–10.3)
Creatinine, Ser: 0.73 mg/dL (ref 0.57–1.00)
GFR calc Af Amer: 96 mL/min/{1.73_m2} (ref >59)
GFR calc non Af Amer: 83 mL/min/{1.73_m2} (ref >59)
Globulin, Total: 2.7 g/dL (ref 1.5–4.5)
Glucose: 123 mg/dL — ABNORMAL HIGH (ref 65–99)
POTASSIUM: 4.9 mmol/L (ref 3.5–5.2)
Sodium: 141 mmol/L (ref 134–144)
Total Protein: 7.6 g/dL (ref 6.0–8.5)

## 2016-01-29 MED ORDER — GEMFIBROZIL 600 MG PO TABS
600.0000 mg | ORAL_TABLET | Freq: Every morning | ORAL | Status: DC
Start: 2016-01-29 — End: 2017-02-08

## 2016-01-29 MED ORDER — GLUCOSE BLOOD VI STRP: ORAL_STRIP | Status: DC

## 2016-01-29 MED ORDER — ACCU-CHEK COMPACT PLUS CARE KIT: PACK | Status: DC

## 2016-01-29 NOTE — Telephone Encounter (Signed)
Advised pharmacy to change to Accu-Chek [facility]. Renaldo Fiddler, CMA

## 2016-01-29 NOTE — Telephone Encounter (Signed)
Advised pt of lab results. Pt verbally acknowledges understanding. Pt requested a prescription for a new glucometer, as well as a refill on cholesterol medication. Sent in per verbal ok from Dr. Venia Minks. Renaldo Fiddler, CMA

## 2016-01-29 NOTE — Telephone Encounter (Signed)
Pharmacy request to change her diabetic meter to a different one.  The one you prescribed is no longer on the market.  Call back to pharmacy  [phone]  Thanks Con Memos

## 2016-01-29 NOTE — Telephone Encounter (Signed)
-----   Message from Margarita Rana, MD sent at 01/29/2016  1:23 PM EST ----- Labs stable. Please notify patient. Thanks.

## 2016-03-02 ENCOUNTER — Encounter: Payer: Self-pay | Admitting: *Deleted

## 2016-03-03 ENCOUNTER — Telehealth: Payer: Self-pay | Admitting: Family Medicine

## 2016-03-03 NOTE — Telephone Encounter (Signed)
Pt would like to pick up copies of her last 2 lab results Feb & Oct. Thanks TNP

## 2016-03-03 NOTE — Telephone Encounter (Signed)
Printed off lab results and left message informing pt. Renaldo Fiddler, CMA

## 2016-03-04 ENCOUNTER — Encounter: Payer: Self-pay | Admitting: Family Medicine

## 2016-03-04 ENCOUNTER — Ambulatory Visit (INDEPENDENT_AMBULATORY_CARE_PROVIDER_SITE_OTHER): Payer: Medicare Other | Admitting: Family Medicine

## 2016-03-04 VITALS — BP 140/66 | HR 76 | Temp 97.6°F | Ht 63.5 in | Wt 150.2 lb

## 2016-03-04 DIAGNOSIS — I10 Essential (primary) hypertension: Secondary | ICD-10-CM

## 2016-03-04 DIAGNOSIS — E7800 Pure hypercholesterolemia, unspecified: Secondary | ICD-10-CM | POA: Diagnosis not present

## 2016-03-04 DIAGNOSIS — E119 Type 2 diabetes mellitus without complications: Secondary | ICD-10-CM

## 2016-03-04 DIAGNOSIS — J309 Allergic rhinitis, unspecified: Secondary | ICD-10-CM | POA: Insufficient documentation

## 2016-03-04 DIAGNOSIS — L409 Psoriasis, unspecified: Secondary | ICD-10-CM

## 2016-03-04 DIAGNOSIS — E559 Vitamin D deficiency, unspecified: Secondary | ICD-10-CM | POA: Insufficient documentation

## 2016-03-04 DIAGNOSIS — I2510 Atherosclerotic heart disease of native coronary artery without angina pectoris: Secondary | ICD-10-CM

## 2016-03-04 DIAGNOSIS — L659 Nonscarring hair loss, unspecified: Secondary | ICD-10-CM

## 2016-03-04 LAB — HM DIABETES EYE EXAM

## 2016-03-04 NOTE — Assessment & Plan Note (Signed)
Followed by Dr Gollan. 

## 2016-03-04 NOTE — Assessment & Plan Note (Signed)
Using rogaine to grow hair back, now improving.

## 2016-03-04 NOTE — Assessment & Plan Note (Signed)
Previously well controlled on lisinopril.

## 2016-03-04 NOTE — Assessment & Plan Note (Signed)
Seasonal, stable control now.

## 2016-03-04 NOTE — Progress Notes (Signed)
Subjective:    Patient ID: Renee [name], female    DOB: [birth year], 70 y.o.   MRN: [id number]  HPI   70 year old female presents to establish care as transfer from [facility].  Previously seen by Margarita Rana MD. Last OV 01/2016 for DM.   Diabetes:  Borderline control on no medication.  Lab Results  Component Value Date   HGBA1C 7.0 01/28/2016  Using medications without difficulties: Hypoglycemic episodes: None Hyperglycemic episodes:None Feet problems: no ulcers. Blood Sugars averaging: FBS: 120-130 Eye exam within last year: due today, has appt.  Hypertension:    Borderline control today in office on lisinopril BP Readings from Last 3 Encounters:  03/04/16 140/66  01/28/16 110/64  11/27/15 140/62  Using medication without problems or lightheadedness:  None Chest pain with exertion:None Edema:None Short of breath:None Average home BPs: not checking Other issues: HX of CAD  High cholesterol on gemfibrozil LDL almost at goal < 100 in 10/20164  HX of SE to statins. Lab Results  Component Value Date   CHOL 168 10/20/2015   HDL 47 10/20/2015   LDLCALC 102* 10/20/2015   TRIG 93 10/20/2015   CHOLHDL 3.6 10/20/2015  Exercise: walking  Several times a week.  Hypothyroidism  Stable control at last check on levo Lab Results  Component Value Date   TSH 1.620 10/20/2015   Sees GYN for breast and vaginal exam at [facility]. Seeing every 2 years.    She sees Dr.Gollan yearly for CAD. Last OV end of 2016.   Review of Systems  Constitutional: Negative for fever and fatigue.  HENT: Negative for congestion.   Eyes: Negative for pain.  Respiratory: Negative for cough and shortness of breath.   Cardiovascular: Negative for chest pain, palpitations and leg swelling.  Gastrointestinal: Negative for abdominal pain.  Genitourinary: Negative for dysuria and vaginal bleeding.  Musculoskeletal: Positive for back pain.  Neurological: Negative for syncope,  light-headedness and headaches.  Psychiatric/Behavioral: Negative for dysphoric mood.       Objective:   Physical Exam  Constitutional: Vital signs are normal. She appears well-developed and well-nourished. She is cooperative.  Non-toxic appearance. She does not appear ill. No distress.  HENT:  Head: Normocephalic.  Right Ear: Hearing, tympanic membrane, external ear and ear canal normal.  Left Ear: Hearing, tympanic membrane, external ear and ear canal normal.  Nose: Nose normal.  Eyes: Conjunctivae, EOM and lids are normal. Pupils are equal, round, and reactive to light. Lids are everted and swept, no foreign bodies found.  Neck: Trachea normal and normal range of motion. Neck supple. Carotid bruit is not present. No thyroid mass and no thyromegaly present.  Cardiovascular: Normal rate, regular rhythm, S1 normal, S2 normal, normal heart sounds and intact distal pulses.  Exam reveals no gallop.   No murmur heard. Pulmonary/Chest: Effort normal and breath sounds normal. No respiratory distress. She has no wheezes. She has no rhonchi. She has no rales.  Abdominal: Soft. Normal appearance and bowel sounds are normal. She exhibits no distension, no fluid wave, no abdominal bruit and no mass. There is no hepatosplenomegaly. There is no tenderness. There is no rebound, no guarding and no CVA tenderness. No hernia.  Lymphadenopathy:    She has no cervical adenopathy.    She has no axillary adenopathy.  Neurological: She is alert. She has normal strength. No cranial nerve deficit or sensory deficit.  Skin: Skin is warm, dry and intact. No rash noted.  Psychiatric: Her speech is  normal and behavior is normal. Judgment normal. Her mood appears not anxious. Cognition and memory are normal. She does not exhibit a depressed mood.          Assessment & Plan:

## 2016-03-04 NOTE — Assessment & Plan Note (Signed)
Almost at goal LDL < 100 on lopid at last check.

## 2016-03-04 NOTE — Patient Instructions (Addendum)
Check blood pressure at home occasionally.. Call if > 140/90 regularly.  Continue to exercise and work on low carb diet.

## 2016-03-04 NOTE — Assessment & Plan Note (Signed)
Stable control at last check but borderline on no med. Encouraged exercise, weight loss, healthy eating habits.

## 2016-03-04 NOTE — Progress Notes (Signed)
Pre visit review using our clinic review tool, if applicable. No additional management support is needed unless otherwise documented below in the visit note. 

## 2016-05-12 ENCOUNTER — Ambulatory Visit: Payer: Medicare Other | Admitting: Family Medicine

## 2016-05-13 DIAGNOSIS — Z471 Aftercare following joint replacement surgery: Secondary | ICD-10-CM | POA: Diagnosis not present

## 2016-05-13 DIAGNOSIS — Z96653 Presence of artificial knee joint, bilateral: Secondary | ICD-10-CM | POA: Diagnosis not present

## 2016-05-18 DIAGNOSIS — X32XXXA Exposure to sunlight, initial encounter: Secondary | ICD-10-CM | POA: Diagnosis not present

## 2016-05-18 DIAGNOSIS — L821 Other seborrheic keratosis: Secondary | ICD-10-CM | POA: Diagnosis not present

## 2016-05-18 DIAGNOSIS — D485 Neoplasm of uncertain behavior of skin: Secondary | ICD-10-CM | POA: Diagnosis not present

## 2016-05-18 DIAGNOSIS — C44722 Squamous cell carcinoma of skin of right lower limb, including hip: Secondary | ICD-10-CM | POA: Diagnosis not present

## 2016-05-18 DIAGNOSIS — L570 Actinic keratosis: Secondary | ICD-10-CM | POA: Diagnosis not present

## 2016-07-12 DIAGNOSIS — L905 Scar conditions and fibrosis of skin: Secondary | ICD-10-CM | POA: Diagnosis not present

## 2016-07-12 DIAGNOSIS — C44722 Squamous cell carcinoma of skin of right lower limb, including hip: Secondary | ICD-10-CM | POA: Diagnosis not present

## 2016-07-29 ENCOUNTER — Telehealth: Payer: Self-pay | Admitting: Family Medicine

## 2016-07-29 DIAGNOSIS — E119 Type 2 diabetes mellitus without complications: Secondary | ICD-10-CM

## 2016-07-29 DIAGNOSIS — E7800 Pure hypercholesterolemia, unspecified: Secondary | ICD-10-CM

## 2016-07-29 DIAGNOSIS — E039 Hypothyroidism, unspecified: Secondary | ICD-10-CM

## 2016-07-29 DIAGNOSIS — E559 Vitamin D deficiency, unspecified: Secondary | ICD-10-CM

## 2016-07-29 DIAGNOSIS — Z1159 Encounter for screening for other viral diseases: Secondary | ICD-10-CM

## 2016-07-29 NOTE — Telephone Encounter (Signed)
-----   Message from Ellamae Sia sent at 07/22/2016  5:19 PM EDT ----- Regarding: Lab orders for Friday, 8.4.17  AWV lab orders, please.

## 2016-07-30 ENCOUNTER — Other Ambulatory Visit (INDEPENDENT_AMBULATORY_CARE_PROVIDER_SITE_OTHER): Payer: Medicare Other

## 2016-07-30 ENCOUNTER — Ambulatory Visit (INDEPENDENT_AMBULATORY_CARE_PROVIDER_SITE_OTHER): Payer: Medicare Other

## 2016-07-30 ENCOUNTER — Telehealth: Payer: Self-pay | Admitting: Family Medicine

## 2016-07-30 VITALS — BP 132/68 | HR 71 | Temp 98.3°F | Ht 63.0 in | Wt 150.0 lb

## 2016-07-30 DIAGNOSIS — Z0000 Encounter for general adult medical examination without abnormal findings: Secondary | ICD-10-CM | POA: Diagnosis not present

## 2016-07-30 DIAGNOSIS — E119 Type 2 diabetes mellitus without complications: Secondary | ICD-10-CM

## 2016-07-30 DIAGNOSIS — Z1159 Encounter for screening for other viral diseases: Secondary | ICD-10-CM | POA: Diagnosis not present

## 2016-07-30 DIAGNOSIS — E7800 Pure hypercholesterolemia, unspecified: Secondary | ICD-10-CM

## 2016-07-30 DIAGNOSIS — E039 Hypothyroidism, unspecified: Secondary | ICD-10-CM

## 2016-07-30 DIAGNOSIS — E559 Vitamin D deficiency, unspecified: Secondary | ICD-10-CM

## 2016-07-30 LAB — COMPREHENSIVE METABOLIC PANEL
ALT: 18 U/L (ref 0–35)
AST: 24 U/L (ref 0–37)
Albumin: 4.7 g/dL (ref 3.5–5.2)
Alkaline Phosphatase: 61 U/L (ref 39–117)
BUN: 27 mg/dL — ABNORMAL HIGH (ref 6–23)
CALCIUM: 10.5 mg/dL (ref 8.4–10.5)
CHLORIDE: 102 meq/L (ref 96–112)
CO2: 28 meq/L (ref 19–32)
CREATININE: 0.84 mg/dL (ref 0.40–1.20)
GFR: 70.85 mL/min (ref >60.00)
GLUCOSE: 160 mg/dL — AB (ref 70–99)
Potassium: 4.6 mEq/L (ref 3.5–5.1)
SODIUM: 139 meq/L (ref 135–145)
TOTAL PROTEIN: 8.3 g/dL (ref 6.0–8.3)
Total Bilirubin: 0.5 mg/dL (ref 0.2–1.2)

## 2016-07-30 LAB — LIPID PANEL
CHOL/HDL RATIO: 4
CHOLESTEROL: 179 mg/dL (ref 0–200)
HDL: 43.6 mg/dL (ref >39.00)
LDL CALC: 121 mg/dL — AB (ref 0–99)
NonHDL: 135.58
Triglycerides: 75 mg/dL (ref 0.0–149.0)
VLDL: 15 mg/dL (ref 0.0–40.0)

## 2016-07-30 LAB — T4, FREE: Free T4: 0.93 ng/dL (ref 0.60–1.60)

## 2016-07-30 LAB — HEMOGLOBIN A1C: Hgb A1c MFr Bld: 7 % — ABNORMAL HIGH (ref 4.6–6.5)

## 2016-07-30 LAB — T3, FREE: T3, Free: 3.1 pg/mL (ref 2.3–4.2)

## 2016-07-30 LAB — TSH: TSH: 3.39 u[IU]/mL (ref 0.35–4.50)

## 2016-07-30 LAB — VITAMIN D 25 HYDROXY (VIT D DEFICIENCY, FRACTURES): VITD: 46.67 ng/mL (ref 30.00–100.00)

## 2016-07-30 NOTE — Telephone Encounter (Signed)
error 

## 2016-07-30 NOTE — Progress Notes (Signed)
I reviewed health advisor's note, was available for consultation, and agree with documentation and plan.  

## 2016-07-30 NOTE — Progress Notes (Signed)
PCP notes:   Health maintenance:  Foot exam - will be completed at [facility] Colon cancer screening - pt will discuss with PCP at [facility] Flu vaccine - addressed Shingles - postponed PNA vaccines - pt will get records from previous PCP Bone density - postponed/will complete with mammogram in 2018 A1C - done Hep C screening - done  Abnormal screenings:   None  Patient concerns:   Pt verbalized concerns over co-pays required for certain medications.   Nurse concerns:  None  Next PCP appt:   08/06/16 @ 1115

## 2016-07-30 NOTE — Progress Notes (Signed)
Subjective:   Renee [name] is a 70 y.o. female who presents for Medicare Annual (Subsequent) preventive examination.  Review of Systems:  N/A Cardiac Risk Factors include: advanced age (>65mn, >>42women);diabetes mellitus;dyslipidemia;hypertension     Objective:     Vitals: BP 132/68 (BP Location: Left Arm, Patient Position: Sitting, Cuff Size: Normal)   Pulse 71   Temp 98.3 F (36.8 C) (Oral)   Ht '5\' 3"'$  (1.6 m) Comment: no shoes  Wt 150 lb (68 kg)   SpO2 95%   BMI 26.57 kg/m   Body mass index is 26.57 kg/m.   Tobacco History  Smoking Status  . Never Smoker  Smokeless Tobacco  . Never Used     Counseling given: No   Past Medical History:  Diagnosis Date  . Allergy   . Arthritis   . CAD (coronary artery disease)    non-obstructive. cath 11/11  . Diabetes mellitus    borderline diabetic - diet controlled   . History of chicken pox   . History of phobia    clastrophobia  . Hyperlipidemia   . Hypertension   . Hypothyroidism   . Kidney stones    hx of   . Psoriasis   . Urinary tract infection    hx of    Past Surgical History:  Procedure Laterality Date  . APPENDECTOMY    . CARDIAC CATHETERIZATION     2011  . CARPAL TUNNEL RELEASE     Left trigger finger  . EYE SURGERY     lasik - right eye   . TOTAL KNEE ARTHROPLASTY Bilateral 06/12/2015   Procedure: TOTAL KNEE BILATERAL;  Surgeon: FGaynelle Arabian MD;  Location: [facility];  Service: Orthopedics;  Laterality: Bilateral;  and epidural  . TUBAL LIGATION    . Tubes tied     and untied. and tied again   Family History  Problem Relation Age of Onset  . COPD Mother   . Diabetes Sister   . Hypertension Sister   . Diabetes Brother   . Arthritis Brother   . Arthritis Brother    History  Sexual Activity  . Sexual activity: No    Outpatient Encounter Prescriptions as of 07/30/2016  Medication Sig  . acetaminophen (TYLENOL) 500 MG tablet Take 1,000 mg by mouth every 6 (six) hours as needed for  moderate pain or headache.  . Blood Glucose Monitoring Suppl (ACCU-CHEK COMPACT CARE KIT) KIT Meter Kit  . diphenhydrAMINE (BENADRYL) 25 MG tablet Take 1 tablet (25 mg total) by mouth at bedtime as needed for sleep.  .Marland KitchenEmollient (CERAVE) CREA Apply 1 application topically daily as needed (psoriasis).   . fluocinonide (LIDEX) 0.05 % cream Apply 1 application topically at bedtime.   .Marland Kitchengemfibrozil (LOPID) 600 MG tablet Take 1 tablet (600 mg total) by mouth every morning.  .Marland Kitchenglucose blood (ACCU-CHEK COMPACT PLUS) test strip Use as instructed  . levothyroxine (SYNTHROID, LEVOTHROID) 75 MCG tablet TAKE 1 TABLET EVERY DAY  . lisinopril (PRINIVIL,ZESTRIL) 20 MG tablet TAKE 1 TABLET EVERY DAY  . nitroGLYCERIN (NITROSTAT) 0.4 MG SL tablet Place 1 tablet (0.4 mg total) under the tongue every 5 (five) minutes as needed.  . triamcinolone (KENALOG) 0.1 % cream Apply 1 application topically at bedtime.   .Marland KitchenVITAMIN D, CHOLECALCIFEROL, PO Take 2,000 Units by mouth daily.    No facility-administered encounter medications on file as of 07/30/2016.     Activities of Daily Living In your present state of health, do you  have any difficulty performing the following activities: 07/30/2016  Hearing? N  Vision? N  Difficulty concentrating or making decisions? N  Walking or climbing stairs? N  Dressing or bathing? N  Doing errands, shopping? N  Preparing Food and eating ? N  Using the Toilet? N  In the past six months, have you accidently leaked urine? N  Do you have problems with loss of bowel control? N  Managing your Medications? N  Managing your Finances? N  Housekeeping or managing your Housekeeping? N  Some recent data might be hidden    Patient Care Team: Jinny Sanders, MD as PCP - General (Family Medicine) Gaynelle Arabian, MD as Consulting Physician (Orthopedic Surgery) Minna Merritts, MD as Consulting Physician (Cardiology) Kennieth Francois, MD as Consulting Physician (Dermatology) Birder Robson,  MD as Referring Physician (Ophthalmology)    Assessment:     Hearing Screening   '125Hz'$  '250Hz'$  '500Hz'$  '1000Hz'$  '2000Hz'$  '3000Hz'$  '4000Hz'$  '6000Hz'$  '8000Hz'$   Right ear:   40 40 40  40    Left ear:   40 40 40  40    Vision Screening Comments: Last vision exam in Dec 2016 with Dr. Merla Riches   Exercise Activities and Dietary recommendations Current Exercise Habits: Home exercise routine, Type of exercise: walking, Time (Minutes): 60, Frequency (Times/Week): 4, Weekly Exercise (Minutes/Week): 240, Intensity: Mild, Exercise limited by: None identified  Goals    . Increase physical activity          Starting 07/30/2016, I will continue to walk or do housework at least 60 min 3-4 days per week.       Fall Risk Fall Risk  07/30/2016  Falls in the past year? No   Depression Screen PHQ 2/9 Scores 07/30/2016  PHQ - 2 Score 0     Cognitive Testing MMSE - Mini Mental State Exam 07/30/2016  Orientation to time 5  Orientation to Place 5  Registration 3  Attention/ Calculation 0  Recall 3  Language- name 2 objects 0  Language- repeat 1  Language- follow 3 step command 3  Language- read & follow direction 0  Write a sentence 0  Copy design 0  Total score 20   PLEASE NOTE: A Mini-Cog screen was completed. Maximum score is 20. A value of 0 denotes this part of Folstein MMSE was not completed or the patient failed this part of the Mini-Cog screening.   Mini-Cog Screening Orientation to Time - Max 5 pts Orientation to Place - Max 5 pts Registration - Max 3 pts Recall - Max 3 pts Language Repeat - Max 1 pts Language Follow 3 Step Command - Max 3 pts   Immunization History  Administered Date(s) Administered  . Influenza-Unspecified 08/27/2014, 09/18/2015   Screening Tests Health Maintenance  Topic Date Due  . FOOT EXAM  08/06/2016 (Originally 09/01/1956)  . INFLUENZA VACCINE  12/26/2016 (Originally 07/27/2016)  . COLONOSCOPY  12/26/2016 (Originally 09/01/1994)  . PNA vac Low Risk Adult (1 of 2 - PCV13)  12/26/2016 (Originally 09/01/2009)  . ZOSTAVAX  07/30/2017 (Originally 09/01/2004)  . DEXA SCAN  11/27/2017 (Originally 09/01/2009)  . HEMOGLOBIN A1C  01/30/2017  . OPHTHALMOLOGY EXAM  03/04/2017  . MAMMOGRAM  11/27/2017  . TETANUS/TDAP  12/27/2021  . Hepatitis C Screening  Completed      Plan:     I have personally reviewed and addressed the Medicare Annual Wellness questionnaire and have noted the following in the patient's chart:  A. Medical and social history B. Use of alcohol,  tobacco or illicit drugs  C. Current medications and supplements D. Functional ability and status E.  Nutritional status F.  Physical activity G. Advance directives H. List of other physicians I.  Hospitalizations, surgeries, and ER visits in previous 12 months J.  [place] to include hearing, vision, cognitive, depression L. Referrals and appointments - none  In addition, I have reviewed and discussed with patient certain preventive protocols, quality metrics, and best practice recommendations. A written personalized care plan for preventive services as well as general preventive health recommendations were provided to patient.  See attached scanned questionnaire for additional information.   Signed,   Lindell Noe, MHA, BS, LPN Health Advisor

## 2016-07-30 NOTE — Progress Notes (Signed)
Pre visit review using our clinic review tool, if applicable. No additional management support is needed unless otherwise documented below in the visit note. 

## 2016-07-30 NOTE — Patient Instructions (Addendum)
Renee [name] , Thank you for taking time to come for your Medicare Wellness Visit. I appreciate your ongoing commitment to your health goals. Please review the following plan we discussed and let me know if I can assist you in the future.   These are the goals we discussed: Goals    . Increase physical activity          Starting 07/30/2016, I will continue to walk or do housework at least 60 min 3-4 days per week.        This is a list of the screening recommended for you and due dates:  Health Maintenance  Topic Date Due  . Complete foot exam   08/06/2016*  . Flu Shot  12/26/2016*  . Colon Cancer Screening  12/26/2016*  . Pneumonia vaccines (1 of 2 - PCV13) 12/26/2016*  . Shingles Vaccine  07/30/2017*  . DEXA scan (bone density measurement)  11/27/2017*  . Hemoglobin A1C  01/30/2017  . Eye exam for diabetics  03/04/2017  . Mammogram  11/27/2017  . Tetanus Vaccine  12/27/2021  .  Hepatitis C: One time screening is recommended by Center for Disease Control  (CDC) for  adults born from [birth year] through 1967.   Completed  *Topic was postponed. The date shown is not the original due date.   Preventive Care for Adults  A healthy lifestyle and preventive care can promote health and wellness. Preventive health guidelines for adults include the following key practices.  . A routine yearly physical is a good way to check with your health care provider about your health and preventive screening. It is a chance to share any concerns and updates on your health and to receive a thorough exam.  . Visit your dentist for a routine exam and preventive care every 6 months. Brush your teeth twice a day and floss once a day. Good oral hygiene prevents tooth decay and gum disease.  . The frequency of eye exams is based on your age, health, family medical history, use  of contact lenses, and other factors. Follow your health care provider's ecommendations for frequency of eye exams.  . Eat a healthy diet.  Foods like vegetables, fruits, whole grains, low-fat dairy products, and lean protein foods contain the nutrients you need without too many calories. Decrease your intake of foods high in solid fats, added sugars, and salt. Eat the right amount of calories for you. Get information about a proper diet from your health care provider, if necessary.  . Regular physical exercise is one of the most important things you can do for your health. Most adults should get at least 150 minutes of moderate-intensity exercise (any activity that increases your heart rate and causes you to sweat) each week. In addition, most adults need muscle-strengthening exercises on 2 or more days a week.  Silver Sneakers may be a benefit available to you. To determine eligibility, you may visit the website: www.silversneakers.com or contact program at 971-376-0766 Mon-Fri between 8AM-8PM.   . Maintain a healthy weight. The body mass index (BMI) is a screening tool to identify possible weight problems. It provides an estimate of body fat based on height and weight. Your health care provider can find your BMI and can help you achieve or maintain a healthy weight.   For adults 20 years and older: ? A BMI below 18.5 is considered underweight. ? A BMI of 18.5 to 24.9 is normal. ? A BMI of 25 to 29.9 is considered overweight. ? A  BMI of 30 and above is considered obese.   . Maintain normal blood lipids and cholesterol levels by exercising and minimizing your intake of saturated fat. Eat a balanced diet with plenty of fruit and vegetables. Blood tests for lipids and cholesterol should begin at age 14 and be repeated every 5 years. If your lipid or cholesterol levels are high, you are over 50, or you are at high risk for heart disease, you may need your cholesterol levels checked more frequently. Ongoing high lipid and cholesterol levels should be treated with medicines if diet and exercise are not working.  . If you smoke, find out  from your health care provider how to quit. If you do not use tobacco, please do not start.  . If you choose to drink alcohol, please do not consume more than 2 drinks per day. One drink is considered to be 12 ounces (355 mL) of beer, 5 ounces (148 mL) of wine, or 1.5 ounces (44 mL) of liquor.  . If you are 59-78 years old, ask your health care provider if you should take aspirin to prevent strokes.  . Use sunscreen. Apply sunscreen liberally and repeatedly throughout the day. You should seek shade when your shadow is shorter than you. Protect yourself by wearing long sleeves, pants, a wide-brimmed hat, and sunglasses year round, whenever you are outdoors.  . Once a month, do a whole body skin exam, using a mirror to look at the skin on your back. Tell your health care provider of new moles, moles that have irregular borders, moles that are larger than a pencil eraser, or moles that have changed in shape or color.

## 2016-07-30 NOTE — Telephone Encounter (Signed)
Pt used to see Dr. Venia Minks.  She sees someone at [facility] now.  She needs to get a copy of where she had the Pneumonia vaccine.    She wants to come by and pick it up today.  Thanks Con Memos

## 2016-07-31 LAB — HEPATITIS C ANTIBODY: HCV Ab: NEGATIVE

## 2016-08-06 ENCOUNTER — Encounter: Payer: Self-pay | Admitting: Family Medicine

## 2016-08-06 ENCOUNTER — Ambulatory Visit (INDEPENDENT_AMBULATORY_CARE_PROVIDER_SITE_OTHER): Payer: Medicare Other | Admitting: Family Medicine

## 2016-08-06 DIAGNOSIS — I10 Essential (primary) hypertension: Secondary | ICD-10-CM

## 2016-08-06 DIAGNOSIS — I2510 Atherosclerotic heart disease of native coronary artery without angina pectoris: Secondary | ICD-10-CM

## 2016-08-06 DIAGNOSIS — E119 Type 2 diabetes mellitus without complications: Secondary | ICD-10-CM | POA: Diagnosis not present

## 2016-08-06 DIAGNOSIS — E039 Hypothyroidism, unspecified: Secondary | ICD-10-CM

## 2016-08-06 DIAGNOSIS — E7800 Pure hypercholesterolemia, unspecified: Secondary | ICD-10-CM

## 2016-08-06 LAB — HM DIABETES FOOT EXAM

## 2016-08-06 NOTE — Assessment & Plan Note (Signed)
Stable control on current dosing.

## 2016-08-06 NOTE — Assessment & Plan Note (Signed)
ON ACEI. Well controlled. Continue current medication.

## 2016-08-06 NOTE — Progress Notes (Signed)
Pre visit review using our clinic review tool, if applicable. No additional management support is needed unless otherwise documented below in the visit note. 

## 2016-08-06 NOTE — Progress Notes (Signed)
Earlier on 07/30/2016 she saw Candis Musa, LPN for medicare wellness. Note  reviewed in detail   Abnormal screenings:  None  Patient concerns:  Pt verbalized concerns over co-pays required for certain medications.  Sees derm for psoriasis, eczematoid dermatitis.. Those are the expensive meds for her.  Nurse concerns: None  Diabetes:  Borderline control on no medication.  Lab Results  Component Value Date   HGBA1C 7.0 (H) 07/30/2016  Using medications without difficulties: Hypoglycemic episodes: None Hyperglycemic episodes:None Feet problems: no ulcers. Blood Sugars averaging: FBS:  120-170 Eye exam within last year: 02/2016  Hypertension:   Good control today in office on lisinopril BP Readings from Last 3 Encounters:  08/06/16 132/66  07/30/16 132/68  03/04/16 140/66  Using medication without problems or lightheadedness:  None Chest pain with exertion:None Edema:None Short of breath:None Average home BPs: not checking Other issues: HX of CAD   Vit D nml.  High cholesterol on gemfibrozil LDL NOT at goal < 100. HX of SE to statins. Lab Results  Component Value Date   CHOL 179 07/30/2016   HDL 43.60 07/30/2016   LDLCALC 121 (H) 07/30/2016   TRIG 75.0 07/30/2016   CHOLHDL 4 07/30/2016  Exercise: walking  Several times a week.  Hypothyroidism  Stable control  on levo Lab Results  Component Value Date   TSH 3.39 07/30/2016   Sees GYN for breast and vaginal exam at [facility]. Seeing every 2 years.   She sees Dr.Gollan yearly for CAD. Last OV end of 2016.   Review of Systems  Constitutional: Negative for fever and fatigue.  HENT: Negative for congestion.   Eyes: Negative for pain.  Respiratory: Negative for cough and shortness of breath.   Cardiovascular: Negative for chest pain, palpitations and leg swelling.  Gastrointestinal: Negative for abdominal pain.  Genitourinary: Negative for dysuria and vaginal bleeding.  Musculoskeletal:  Positive for back pain.  Neurological: Negative for syncope, light-headedness and headaches.  Psychiatric/Behavioral: Negative for dysphoric mood.       Objective:   Physical Exam  Constitutional: Vital signs are normal. She appears well-developed and well-nourished. She is cooperative.  Non-toxic appearance. She does not appear ill. No distress.  HENT:  Head: Normocephalic.  Right Ear: Hearing, tympanic membrane, external ear and ear canal normal.  Left Ear: Hearing, tympanic membrane, external ear and ear canal normal.  Nose: Nose normal.  Eyes: Conjunctivae, EOM and lids are normal. Pupils are equal, round, and reactive to light. Lids are everted and swept, no foreign bodies found.  Neck: Trachea normal and normal range of motion. Neck supple. Carotid bruit is not present. No thyroid mass and no thyromegaly present.  Cardiovascular: Normal rate, regular rhythm, S1 normal, S2 normal, normal heart sounds and intact distal pulses.  Exam reveals no gallop.   No murmur heard. Pulmonary/Chest: Effort normal and breath sounds normal. No respiratory distress. She has no wheezes. She has no rhonchi. She has no rales.  Abdominal: Soft. Normal appearance and bowel sounds are normal. She exhibits no distension, no fluid wave, no abdominal bruit and no mass. There is no hepatosplenomegaly. There is no tenderness. There is no rebound, no guarding and no CVA tenderness. No hernia.  Lymphadenopathy:    She has no cervical adenopathy.    She has no axillary adenopathy.  Neurological: She is alert. She has normal strength. No cranial nerve deficit or sensory deficit.  Skin: Skin is warm, dry and intact. No rash noted.  Psychiatric: Her speech is normal and  behavior is normal. Judgment normal. Her mood appears not anxious. Cognition and memory are normal. She does not exhibit a depressed mood.    Diabetic foot exam: Normal inspection No skin breakdown No calluses  Normal DP pulses Normal  sensation to light touch and monofilament Nails normal   ASSESSMENT AND PLAN: The patient's preventative maintenance and recommended screening tests for an annual wellness exam were reviewed in full today. Brought up to date unless services declined.  Counselled on the importance of diet, exercise, and its role in overall health and mortality. The patient's FH and SH was reviewed, including their home life, tobacco status, and drug and alcohol status.   Vaccines: Shingles - postponed PNA vaccines - pt will get records from previous PCP Foot exam - DONE today Colon cancer screening - colonoscopy at [facility] 2012.. Due for repeat given polyps in past.  Mammo: 11/2015 Bone density - postponed/will complete with mammogram in 2018 Hep C screening - done  nonsmoker

## 2016-08-06 NOTE — Assessment & Plan Note (Signed)
Inadequate control on gemfibrozil. Intolerant of statins in past.

## 2016-08-06 NOTE — Patient Instructions (Addendum)
Work on low carb  and low cholesterol diet. Review information.  Decrease bedtime snacking. Decrease bread, rice, pasta, potatos. Increase exercise as tolerated.  Bring by vaccine record. Plan mammogram and bone density next year.  Call to schedule colonoscopy at [facility].

## 2016-08-06 NOTE — Assessment & Plan Note (Signed)
Stable control on no med. Encouraged exercise, weight loss, healthy eating habits.  

## 2016-09-11 DIAGNOSIS — Z23 Encounter for immunization: Secondary | ICD-10-CM | POA: Diagnosis not present

## 2016-11-15 DIAGNOSIS — D0472 Carcinoma in situ of skin of left lower limb, including hip: Secondary | ICD-10-CM | POA: Diagnosis not present

## 2016-11-15 DIAGNOSIS — D2272 Melanocytic nevi of left lower limb, including hip: Secondary | ICD-10-CM | POA: Diagnosis not present

## 2016-11-15 DIAGNOSIS — L570 Actinic keratosis: Secondary | ICD-10-CM | POA: Diagnosis not present

## 2016-11-15 DIAGNOSIS — D225 Melanocytic nevi of trunk: Secondary | ICD-10-CM | POA: Diagnosis not present

## 2016-11-15 DIAGNOSIS — X32XXXA Exposure to sunlight, initial encounter: Secondary | ICD-10-CM | POA: Diagnosis not present

## 2016-11-15 DIAGNOSIS — D485 Neoplasm of uncertain behavior of skin: Secondary | ICD-10-CM | POA: Diagnosis not present

## 2016-11-15 DIAGNOSIS — Z85828 Personal history of other malignant neoplasm of skin: Secondary | ICD-10-CM | POA: Diagnosis not present

## 2016-11-15 DIAGNOSIS — D2261 Melanocytic nevi of right upper limb, including shoulder: Secondary | ICD-10-CM | POA: Diagnosis not present

## 2016-11-17 ENCOUNTER — Emergency Department (HOSPITAL_COMMUNITY)
Admission: EM | Admit: 2016-11-17 | Discharge: 2016-11-17 | Disposition: A | Discharge status: Home / Self Care | Payer: Medicare Other | Attending: Emergency Medicine | Admitting: Emergency Medicine

## 2016-11-17 ENCOUNTER — Encounter (HOSPITAL_COMMUNITY): Payer: Self-pay | Admitting: Emergency Medicine

## 2016-11-17 ENCOUNTER — Telehealth: Payer: Self-pay

## 2016-11-17 DIAGNOSIS — E039 Hypothyroidism, unspecified: Secondary | ICD-10-CM | POA: Insufficient documentation

## 2016-11-17 DIAGNOSIS — Z79899 Other long term (current) drug therapy: Secondary | ICD-10-CM | POA: Insufficient documentation

## 2016-11-17 DIAGNOSIS — I10 Essential (primary) hypertension: Secondary | ICD-10-CM | POA: Diagnosis not present

## 2016-11-17 DIAGNOSIS — E119 Type 2 diabetes mellitus without complications: Secondary | ICD-10-CM | POA: Insufficient documentation

## 2016-11-17 DIAGNOSIS — N3001 Acute cystitis with hematuria: Secondary | ICD-10-CM | POA: Insufficient documentation

## 2016-11-17 DIAGNOSIS — I2510 Atherosclerotic heart disease of native coronary artery without angina pectoris: Secondary | ICD-10-CM | POA: Insufficient documentation

## 2016-11-17 DIAGNOSIS — R1030 Lower abdominal pain, unspecified: Secondary | ICD-10-CM | POA: Diagnosis present

## 2016-11-17 LAB — URINALYSIS, ROUTINE W REFLEX MICROSCOPIC
Glucose, UA: NEGATIVE mg/dL
Ketones, ur: 15 mg/dL — AB
Nitrite: POSITIVE — AB
PH: 5 (ref 5.0–8.0)
Protein, ur: 100 mg/dL — AB
SPECIFIC GRAVITY, URINE: 1.013 (ref 1.005–1.030)

## 2016-11-17 LAB — CBC
HCT: 39.4 % (ref 36.0–46.0)
HEMOGLOBIN: 13.4 g/dL (ref 12.0–15.0)
MCH: 31.2 pg (ref 26.0–34.0)
MCHC: 34 g/dL (ref 30.0–36.0)
MCV: 91.8 fL (ref 78.0–100.0)
Platelets: 423 10*3/uL — ABNORMAL HIGH (ref 150–400)
RBC: 4.29 MIL/uL (ref 3.87–5.11)
RDW: 12.8 % (ref 11.5–15.5)
WBC: 16.9 10*3/uL — ABNORMAL HIGH (ref 4.0–10.5)

## 2016-11-17 LAB — COMPREHENSIVE METABOLIC PANEL
ALBUMIN: 4.8 g/dL (ref 3.5–5.0)
ALK PHOS: 63 U/L (ref 38–126)
ALT: 17 U/L (ref 14–54)
ANION GAP: 11 (ref 5–15)
AST: 25 U/L (ref 15–41)
BUN: 23 mg/dL — ABNORMAL HIGH (ref 6–20)
CALCIUM: 9.8 mg/dL (ref 8.9–10.3)
CO2: 24 mmol/L (ref 22–32)
Chloride: 106 mmol/L (ref 101–111)
Creatinine, Ser: 0.63 mg/dL (ref 0.44–1.00)
GFR calc non Af Amer: >60 mL/min (ref >60)
GLUCOSE: 139 mg/dL — AB (ref 65–99)
Potassium: 3.7 mmol/L (ref 3.5–5.1)
SODIUM: 141 mmol/L (ref 135–145)
Total Bilirubin: 0.7 mg/dL (ref 0.3–1.2)
Total Protein: 8.7 g/dL — ABNORMAL HIGH (ref 6.5–8.1)

## 2016-11-17 LAB — URINE MICROSCOPIC-ADD ON

## 2016-11-17 LAB — LIPASE, BLOOD: LIPASE: 34 U/L (ref 11–51)

## 2016-11-17 MED ORDER — DEXTROSE 5 % IV SOLN
1.0000 g | Freq: Once | INTRAVENOUS | Status: AC
  Administered 2016-11-17: 1 g via INTRAVENOUS
  Filled 2016-11-17: qty 10

## 2016-11-17 MED ORDER — CEPHALEXIN 500 MG PO CAPS: 500.0000 mg | ORAL_CAPSULE | Freq: Four times a day (QID) | ORAL | 0 refills | Status: DC

## 2016-11-17 NOTE — Telephone Encounter (Signed)
Per chart review tab pt is at [facility] ED.

## 2016-11-17 NOTE — ED Provider Notes (Signed)
[facility] DEPT Provider Note   CSN: [id number] Arrival date & time: 11/17/16  7035     History   Chief Complaint Chief Complaint  Patient presents with  . Abdominal Pain    HPI Renee [name] is a 70 y.o. female.  Patient complains of blood in the urine and pain in her lower abdomen no vomiting no fever   The history is provided by the patient. No language interpreter was used.  Abdominal Pain   This is a new problem. The current episode started 2 days ago. The problem occurs constantly. The problem has not changed since onset.The pain is associated with an unknown factor. The pain is located in the suprapubic region. The pain is at a severity of 4/10. The pain is moderate. Pertinent negatives include anorexia, diarrhea, frequency, hematuria and headaches.    Past Medical History:  Diagnosis Date  . Allergy   . Arthritis   . CAD (coronary artery disease)    non-obstructive. cath 11/11  . Diabetes mellitus    borderline diabetic - diet controlled   . History of chicken pox   . History of phobia    clastrophobia  . Hyperlipidemia   . Hypertension   . Hypothyroidism   . Kidney stones    hx of   . Psoriasis   . Urinary tract infection    hx of     Patient Active Problem List   Diagnosis Date Noted  . Vitamin D deficiency 03/04/2016  . Allergic rhinitis 03/04/2016  . Diabetes mellitus without complication ([place]) 00/93/8182  . External hemorrhoid 10/20/2015  . Gonalgia 10/20/2015  . Psoriasis 10/20/2015  . History of bilateral knee replacement 10/20/2015  . Osteoarthritis of knee 11/25/2014  . Essential hypertension 11/06/2010  . CAD, NATIVE VESSEL 11/06/2010  . Dermatitis, eczematoid 12/09/2006  . Hypercholesteremia 01/25/2006  . Adult hypothyroidism 11/15/2005    Past Surgical History:  Procedure Laterality Date  . APPENDECTOMY    . CARDIAC CATHETERIZATION     2011  . CARPAL TUNNEL RELEASE     Left trigger finger  . EYE SURGERY     lasik - right  eye   . TOTAL KNEE ARTHROPLASTY Bilateral 06/12/2015   Procedure: TOTAL KNEE BILATERAL;  Surgeon: Gaynelle Arabian, MD;  Location: [facility];  Service: Orthopedics;  Laterality: Bilateral;  and epidural  . TUBAL LIGATION    . Tubes tied     and untied. and tied again    OB History    No data available       Home Medications    Prior to Admission medications   Medication Sig Start Date End Date Taking? Authorizing Provider  acetaminophen (TYLENOL) 500 MG tablet Take 1,000 mg by mouth every 6 (six) hours as needed for moderate pain or headache.   Yes Historical Provider, MD  Blood Glucose Monitoring Suppl (ACCU-CHEK COMPACT CARE KIT) KIT Meter Kit 01/29/16  Yes Margarita Rana, MD  diphenhydrAMINE (BENADRYL) 25 MG tablet Take 1 tablet (25 mg total) by mouth at bedtime as needed for sleep. Patient taking differently: Take 25 mg by mouth at bedtime as needed for allergies.  06/17/15  Yes Alexzandrew L Perkins, PA-C  diphenhydramine-acetaminophen (TYLENOL PM) 25-500 MG TABS tablet Take 1 tablet by mouth at bedtime as needed (sleep/pain).   Yes Historical Provider, MD  Emollient (CERAVE) CREA Apply 1 application topically daily as needed (psoriasis).    Yes Historical Provider, MD  gemfibrozil (LOPID) 600 MG tablet Take 1 tablet (600 mg total)  by mouth every morning. 01/29/16  Yes Margarita Rana, MD  glucose blood (ACCU-CHEK COMPACT PLUS) test strip Use as instructed 01/29/16  Yes Margarita Rana, MD  levothyroxine (SYNTHROID, LEVOTHROID) 75 MCG tablet TAKE 1 TABLET EVERY DAY Patient taking differently: TAKE 75 mcg by mouth once EVERY DAY 01/05/16  Yes Margarita Rana, MD  lisinopril (PRINIVIL,ZESTRIL) 20 MG tablet TAKE 1 TABLET EVERY DAY Patient taking differently: TAKE 75m by mouth once EVERY DAY 01/05/16  Yes NMargarita Rana MD  magic mouthwash w/lidocaine SOLN Take 5 mLs by mouth 3 (three) times daily as needed for mouth pain.   Yes Historical Provider, MD  nitroGLYCERIN (NITROSTAT) 0.4 MG SL tablet Place 1  tablet (0.4 mg total) under the tongue every 5 (five) minutes as needed. 09/10/13  Yes TMinna Merritts MD  PRESCRIPTION MEDICATION Apply 1 application topically 2 (two) times daily as needed (rash/irritation and flare ups). Prescription Ointment for psoriasis on hands.   Yes Historical Provider, MD  triamcinolone (KENALOG) 0.1 % cream Apply 1 application topically at bedtime as needed (rash/ irritation).    Yes Historical Provider, MD  VITAMIN D, CHOLECALCIFEROL, PO Take 2,000 Units by mouth daily.    Yes Historical Provider, MD  cephALEXin (KEFLEX) 500 MG capsule Take 1 capsule (500 mg total) by mouth 4 (four) times daily. 11/17/16   JMilton Ferguson MD    Family History Family History  Problem Relation Age of Onset  . COPD Mother   . Diabetes Sister   . Hypertension Sister   . Diabetes Brother   . Arthritis Brother   . Arthritis Brother     Social History Social History  Substance Use Topics  . Smoking status: Never Smoker  . Smokeless tobacco: Never Used  . Alcohol use No     Comment: very little around holidays     Allergies   Other; Codeine; Metformin; Robaxin [methocarbamol]; and Simvastatin   Review of Systems Review of Systems  Constitutional: Negative for appetite change and fatigue.  HENT: Negative for congestion, ear discharge and sinus pressure.   Eyes: Negative for discharge.  Respiratory: Negative for cough.   Cardiovascular: Negative for chest pain.  Gastrointestinal: Positive for abdominal pain. Negative for anorexia and diarrhea.  Genitourinary: Negative for frequency and hematuria.  Musculoskeletal: Negative for back pain.  Skin: Negative for rash.  Neurological: Negative for seizures and headaches.  Psychiatric/Behavioral: Negative for hallucinations.     Physical Exam Updated Vital Signs BP 148/67 (BP Location: Left Arm)   Pulse 79   Temp 97.8 F (36.6 C) (Oral)   Resp 16   Ht _0  (1.6 m)   Wt 154 lb (69.9 kg)   SpO2 97%   BMI 27.28 kg/m    Physical Exam  Constitutional: She is oriented to person, place, and time. She appears well-developed.  HENT:  Head: Normocephalic.  Eyes: Conjunctivae and EOM are normal. No scleral icterus.  Neck: Neck supple. No thyromegaly present.  Cardiovascular: Normal rate and regular rhythm.  Exam reveals no gallop and no friction rub.   No murmur heard. Pulmonary/Chest: No stridor. She has no wheezes. She has no rales. She exhibits no tenderness.  Abdominal: She exhibits no distension. There is tenderness. There is no rebound.  Tender suprapubic  Musculoskeletal: Normal range of motion. She exhibits no edema.  Lymphadenopathy:    She has no cervical adenopathy.  Neurological: She is oriented to person, place, and time. She exhibits normal muscle tone. Coordination normal.  Skin: No rash noted. No  erythema.  Psychiatric: She has a normal mood and affect. Her behavior is normal.     ED Treatments / Results  Labs (all labs ordered are listed, but only abnormal results are displayed) Labs Reviewed  COMPREHENSIVE METABOLIC PANEL - Abnormal; Notable for the following:       Result Value   Glucose, Bld 139 (*)    BUN 23 (*)    Total Protein 8.7 (*)    All other components within normal limits  CBC - Abnormal; Notable for the following:    WBC 16.9 (*)    Platelets 423 (*)    All other components within normal limits  URINALYSIS, ROUTINE W REFLEX MICROSCOPIC (NOT AT [facility]) - Abnormal; Notable for the following:    Color, Urine RED (*)    APPearance CLOUDY (*)    Hgb urine dipstick LARGE (*)    Bilirubin Urine LARGE (*)    Ketones, ur 15 (*)    Protein, ur 100 (*)    Nitrite POSITIVE (*)    Leukocytes, UA LARGE (*)    All other components within normal limits  URINE MICROSCOPIC-ADD ON - Abnormal; Notable for the following:    Squamous Epithelial / LPF 0-5 (*)    Bacteria, UA FEW (*)    All other components within normal limits  URINE CULTURE  LIPASE, BLOOD    EKG  EKG  Interpretation None       Radiology No results found.  Procedures Procedures (including critical care time)  Medications Ordered in ED Medications  cefTRIAXone (ROCEPHIN) 1 g in dextrose 5 % 50 mL IVPB (0 g Intravenous Stopped 11/17/16 0858)     Initial Impression / Assessment and Plan / ED Course  I have reviewed the triage vital signs and the nursing notes.  Pertinent labs & imaging results that were available during my care of the patient were reviewed by me and considered in my medical decision making (see chart for details).  Clinical Course     Patient has a urinary tract infection. She is given Rocephin in emergency department had a progressive Keflex she will follow-up with her PCP  Final Clinical Impressions(s) / ED Diagnoses   Final diagnoses:  Acute cystitis with hematuria    New Prescriptions New Prescriptions   CEPHALEXIN (KEFLEX) 500 MG CAPSULE    Take 1 capsule (500 mg total) by mouth 4 (four) times daily.     Milton Ferguson, MD 11/17/16 [phone]

## 2016-11-17 NOTE — ED Triage Notes (Signed)
Patient is complaining of having blood in urine. Patient says that when she goes to the bathroom and urinate she is having blood pour out. Patient is also having lower abdominal pain.

## 2016-11-17 NOTE — Discharge Instructions (Signed)
Drink plenty of fluids. Return if worsening. Follow-up with your family doctor after he finished the antibiotics

## 2016-11-17 NOTE — Telephone Encounter (Signed)
PLEASE NOTE: All timestamps contained within this report are represented as [place] Standard Time. CONFIDENTIALTY NOTICE: This fax transmission is intended only for the addressee. It contains information that is legally privileged, confidential or otherwise protected from use or disclosure. If you are not the intended recipient, you are strictly prohibited from reviewing, disclosing, copying using or disseminating any of this information or taking any action in reliance on or regarding this information. If you have received this fax in error, please notify us immediately by telephone so that we can arrange for its return to [place]. Phone: [phone], Toll-Free: [phone], Fax: [phone] Page: 1 of 2 Call Id: [id number] [place] Patient Name: Renee [name] Gender: Female DOB: Dec 30, 1945 Age: 70 Y 2 M 6 D Return Phone Number: [phone] (Primary) Address: City/State/Zip: [place] Client [place] Night - Client Client Site [place] Physician Eliezer Lofts - MD Contact Type Call Who Is Calling Patient / Member / Family / Caregiver Call Type Triage / Clinical Caller Name Allisen Hatlestad Relationship To Patient Daughter Return Phone Number [phone] (Primary) Chief Complaint ABDOMINAL PAIN - Severe and only in abdomen Reason for Call Symptomatic / Request for Health Information Initial Comment Mother is having lower abdominal pain and when she urinating it is pure blood. PreDisposition Home Care Translation No Nurse Assessment Nurse: Jimmye Norman, RN, Museum/gallery conservator Date/Time (Eastern Time): 11/17/2016 4:13:31 AM Confirm and document reason for call. If symptomatic, describe symptoms. You must click the next button to save text entered. ---Caller states that her mother is having abdominal pain and urinating blood. Does the patient have any  new or worsening symptoms? ---Yes Will a triage be completed? ---Yes Related visit to physician within the last 2 weeks? ---No Does the PT have any chronic conditions? (i.e. diabetes, asthma, etc.) ---No Is this a behavioral health or substance abuse call? ---No Guidelines Guideline Title Affirmed Question Affirmed Notes Nurse Date/Time (Eastern Time) Urine - Blood In Passing pure blood or large blood clots (i.e., size > a dime) (Exception: fleck or small strands) Jimmye Norman, RN, [employer] 11/17/2016 4:17:23 AM Disp. Time Eilene Ghazi Time) Disposition Final User 11/17/2016 4:10:40 AM Send to Urgent Queue Blanchie Dessert 11/17/2016 4:18:34 AM Go to ED Now Yes Jimmye Norman, RN, Amber PLEASE NOTE: All timestamps contained within this report are represented as [place] Standard Time. CONFIDENTIALTY NOTICE: This fax transmission is intended only for the addressee. It contains information that is legally privileged, confidential or otherwise protected from use or disclosure. If you are not the intended recipient, you are strictly prohibited from reviewing, disclosing, copying using or disseminating any of this information or taking any action in reliance on or regarding this information. If you have received this fax in error, please notify us immediately by telephone so that we can arrange for its return to [place]. Phone: [phone], Toll-Free: [phone], Fax: [phone] Page: 2 of 2 Call Id: [id number] Caller Understands: Yes Disagree/Comply: Comply Care Advice Given Per Guideline GO TO ED NOW: You need to be seen in the Emergency Department. Go to the ER at ___________ [place] now. Drive carefully. DRIVING: Another adult should drive. BRING MEDICINES: * Please bring a list of your current medicines when you go to the Emergency Department (ER). CARE ADVICE given per Urine, Blood In (Adult) guideline. Referrals [facility] - ED

## 2016-11-19 LAB — URINE CULTURE: Culture: 30000 — AB

## 2016-11-20 ENCOUNTER — Telehealth (HOSPITAL_BASED_OUTPATIENT_CLINIC_OR_DEPARTMENT_OTHER): Payer: Self-pay

## 2016-11-20 NOTE — Telephone Encounter (Signed)
Post ED Visit - Positive Culture Follow-up  Culture report reviewed by antimicrobial stewardship pharmacist:  []  Elenor Quinones, Pharm.D. []  Heide Guile, Pharm.D., BCPS []  Parks Neptune, Pharm.D. []  Alycia Rossetti, Pharm.D., BCPS []  [place], [place].D., BCPS, AAHIVP []  Legrand Como, Pharm.D., BCPS, AAHIVP []  Milus Glazier, Pharm.D. []  Stephens November, Pharm.D. Sharilyn Sites Pharm D Positive urine culture Treated with Cephalexin, organism sensitive to the same and no further patient follow-up is required at this time.  [name] 11/20/2016, 9:52 AM

## 2016-11-29 ENCOUNTER — Encounter: Payer: Self-pay | Admitting: Cardiovascular Disease

## 2016-11-29 ENCOUNTER — Ambulatory Visit (INDEPENDENT_AMBULATORY_CARE_PROVIDER_SITE_OTHER): Payer: Medicare Other | Admitting: Cardiovascular Disease

## 2016-11-29 VITALS — BP 146/72 | HR 73 | Ht 64.0 in | Wt 155.8 lb

## 2016-11-29 DIAGNOSIS — E7800 Pure hypercholesterolemia, unspecified: Secondary | ICD-10-CM | POA: Diagnosis not present

## 2016-11-29 DIAGNOSIS — E118 Type 2 diabetes mellitus with unspecified complications: Secondary | ICD-10-CM | POA: Diagnosis not present

## 2016-11-29 DIAGNOSIS — I2510 Atherosclerotic heart disease of native coronary artery without angina pectoris: Secondary | ICD-10-CM | POA: Diagnosis not present

## 2016-11-29 DIAGNOSIS — I10 Essential (primary) hypertension: Secondary | ICD-10-CM

## 2016-11-29 NOTE — Patient Instructions (Addendum)
Medication Instructions:   Check the price of Zetia/Ezetimibe through [facility]   Labwork:  No new labs needed  Testing/Procedures:  No further testing at this time   I recommend watching educational videos on topics of interest to you at:       [email or web address]  Enter code: HEARTCARE    Follow-Up: It was a pleasure seeing you in the office today. Please call us if you have new issues that need to be addressed before your next appt.  [phone]  Your physician wants you to follow-up in: 12 months.  You will receive a reminder letter in the mail two months in advance. If you don't receive a letter, please call our office to schedule the follow-up appointment.  If you need a refill on your cardiac medications before your next appointment, please call your pharmacy.

## 2016-11-29 NOTE — Progress Notes (Signed)
Cardiology Office Note  Date:  11/29/2016   ID:  Renee, [name] 1946-03-18, MRN [id number]  PCP:  Eliezer Lofts, MD   Chief Complaint  Patient presents with  . other    12 month follow up. Meds reviewed by the pt. verbally.     HPI:  Renee [name] is a very pleasant 70 year old woman with a history of hypertension, hyperlipidemia, and a long history of diabetes.  Cardiac catheterization October 2011 for chest pain showed mild to moderate non-obstructive CAD. 2010  With  EF of 55% to 60%.   40-50% LAD disease, 60% ostial diagonal disease, 40% PDA She presents today for follow-up of her coronary artery disease History of 2 knee replacements  in [place]  reports that she is doing well Denies any chest pain concerning for angina Lab work reviewed with her showing hemoglobin A1c 7.0 Total cholesterol 179, LDL 121  Getting over a recent Bladder infection No regular exercise program but does try to stay active  She is unable to tolerate statins, tolerating gemfibrozil Trying to control diabetes through diet alone  EKG on today's visit shows normal sinus rhythm with rate 74 bpm, no significant ST or T-wave changes  Previous significant reaction to statins, simvastatin may have caused a rash.      PMH:   has a past medical history of Allergy; Arthritis; CAD (coronary artery disease); Diabetes mellitus; History of chicken pox; History of phobia; Hyperlipidemia; Hypertension; Hypothyroidism; Kidney stones; Psoriasis; and Urinary tract infection.  PSH:    Past Surgical History:  Procedure Laterality Date  . APPENDECTOMY    . CARDIAC CATHETERIZATION     2011  . CARPAL TUNNEL RELEASE     Left trigger finger  . EYE SURGERY     lasik - right eye   . TOTAL KNEE ARTHROPLASTY Bilateral 06/12/2015   Procedure: TOTAL KNEE BILATERAL;  Surgeon: Gaynelle Arabian, MD;  Location: [facility];  Service: Orthopedics;  Laterality: Bilateral;  and epidural  . TUBAL LIGATION    . Tubes tied     and  untied. and tied again    Current Outpatient Prescriptions  Medication Sig Dispense Refill  . acetaminophen (TYLENOL) 500 MG tablet Take 1,000 mg by mouth every 6 (six) hours as needed for moderate pain or headache.    . Blood Glucose Monitoring Suppl (ACCU-CHEK COMPACT CARE KIT) KIT Meter Kit 1 each 0  . cephALEXin (KEFLEX) 500 MG capsule Take 1 capsule (500 mg total) by mouth 4 (four) times daily. 40 capsule 0  . diphenhydrAMINE (BENADRYL) 25 MG tablet Take 1 tablet (25 mg total) by mouth at bedtime as needed for sleep. (Patient taking differently: Take 25 mg by mouth at bedtime as needed for allergies. ) 30 tablet 0  . diphenhydramine-acetaminophen (TYLENOL PM) 25-500 MG TABS tablet Take 1 tablet by mouth at bedtime as needed (sleep/pain).    . Emollient (CERAVE) CREA Apply 1 application topically daily as needed (psoriasis).     Marland Kitchen gemfibrozil (LOPID) 600 MG tablet Take 1 tablet (600 mg total) by mouth every morning. 90 tablet 3  . glucose blood (ACCU-CHEK COMPACT PLUS) test strip Use as instructed 100 each 12  . levothyroxine (SYNTHROID, LEVOTHROID) 75 MCG tablet TAKE 1 TABLET EVERY DAY (Patient taking differently: TAKE 75 mcg by mouth once EVERY DAY) 90 tablet 3  . lisinopril (PRINIVIL,ZESTRIL) 20 MG tablet TAKE 1 TABLET EVERY DAY (Patient taking differently: TAKE '20mg'$  by mouth once EVERY DAY) 90 tablet 3  . magic mouthwash  w/lidocaine SOLN Take 5 mLs by mouth 3 (three) times daily as needed for mouth pain.    . nitroGLYCERIN (NITROSTAT) 0.4 MG SL tablet Place 1 tablet (0.4 mg total) under the tongue every 5 (five) minutes as needed. 30 tablet 6  . PRESCRIPTION MEDICATION Apply 1 application topically 2 (two) times daily as needed (rash/irritation and flare ups). Prescription Ointment for psoriasis on hands.    . triamcinolone (KENALOG) 0.1 % cream Apply 1 application topically at bedtime as needed (rash/ irritation).     Marland Kitchen VITAMIN D, CHOLECALCIFEROL, PO Take 2,000 Units by mouth daily.       No current facility-administered medications for this visit.      Allergies:   Other; Codeine; Metformin; Robaxin [methocarbamol]; and Simvastatin   Social History:  The patient  reports that she has never smoked. She has never used smokeless tobacco. She reports that she does not drink alcohol or use drugs.   Family History:   family history includes Arthritis in her brother and brother; COPD in her mother; Diabetes in her brother and sister; Hypertension in her sister.    Review of Systems: Review of Systems  Constitutional: Negative.   Respiratory: Negative.   Cardiovascular: Negative.   Gastrointestinal: Negative.   Musculoskeletal: Negative.   Neurological: Negative.   Psychiatric/Behavioral: Negative.   All other systems reviewed and are negative.    PHYSICAL EXAM: VS:  BP (!) 146/72 (BP Location: Left Arm, Patient Position: Sitting, Cuff Size: Normal)   Pulse 73   Ht 5\' 4"  (1.626 m)   Wt 155 lb 12 oz (70.6 kg)   BMI 26.73 kg/m  , BMI Body mass index is 26.73 kg/m. GEN: Well nourished, well developed, in no acute distress  HEENT: normal  Neck: no JVD, carotid bruits, or masses Cardiac: RRR; no murmurs, rubs, or gallops,no edema  Respiratory:  clear to auscultation bilaterally, normal work of breathing GI: soft, nontender, nondistended, + BS MS: no deformity or atrophy  Skin: warm and dry, no rash Neuro:  Strength and sensation are intact Psych: euthymic mood, full affect    Recent Labs: 07/30/2016: TSH 3.39 11/17/2016: ALT 17; BUN 23; Creatinine, Ser 0.63; Hemoglobin 13.4; Platelets 423; Potassium 3.7; Sodium 141    Lipid Panel Lab Results  Component Value Date   CHOL 179 07/30/2016   HDL 43.60 07/30/2016   LDLCALC 121 (H) 07/30/2016   TRIG 75.0 07/30/2016      Wt Readings from Last 3 Encounters:  11/29/16 155 lb 12 oz (70.6 kg)  11/17/16 154 lb (69.9 kg)  08/06/16 155 lb 12 oz (70.6 kg)       ASSESSMENT AND PLAN:  Essential hypertension -  Plan: EKG 12-Lead Blood pressure is well controlled on today's visit. No changes made to the medications.  Atherosclerosis of native coronary artery of native heart without angina pectoris - Plan: EKG 12-Lead Currently with no symptoms of angina. No further workup at this time. Continue current medication regimen.  Hypercholesteremia Will add zetia 10 mg daily Stay on gemfibrozil  Type 2 diabetes mellitus with complication, unspecified long term insulin use status (HCC) Trying to control this with diet   Total encounter time more than 15 minutes  Greater than 50% was spent in counseling and coordination of care with the patient   Disposition:   F/U  12 months   Orders Placed This Encounter  Procedures  . EKG 12-Lead     Signed, 10/06/16, M.D., Ph.D. 11/29/2016  [facility]  [place], [place]

## 2016-11-30 ENCOUNTER — Telehealth: Payer: Self-pay | Admitting: Cardiovascular Disease

## 2016-11-30 DIAGNOSIS — Z1231 Encounter for screening mammogram for malignant neoplasm of breast: Secondary | ICD-10-CM | POA: Diagnosis not present

## 2016-11-30 NOTE — Telephone Encounter (Signed)
Pt stopped by the office stating that she was given an rx for zetia at [facility] yesterday and it is too expensive at her pharmacy. She states that Dr. Rockey Situ told her that he would give her "something to take to [facility] to make it more affordable". Advised her that Dr. Rockey Situ is currently in the ED and that I will give him her message & call her back w/ his recommendation.

## 2016-12-01 ENCOUNTER — Encounter: Payer: Self-pay | Admitting: Family Medicine

## 2016-12-01 MED ORDER — EZETIMIBE 10 MG PO TABS: 10.0000 mg | ORAL_TABLET | Freq: Every day | ORAL | 3 refills | Status: DC

## 2016-12-01 NOTE — Telephone Encounter (Signed)
Patient can log into [email or web address] Printout 90 day prescription for Zetia at [facility] If she would like we can electronically send 90 day prescription into her [place] [facility] out to [employer] per month cash pay,  do not run through insurance Could also talk with [facility] with any prescription she has

## 2016-12-01 NOTE — Telephone Encounter (Signed)
Spoke with patient and reviewed Dr. Donivan Scull recommendations and cost for medication without insurance at [facility] for roughly $25.00 for 30 days and $22.00 at [facility]. Printed out prescription for Dr. Rockey Situ to sign and instructed her to call before coming by to pick that up. She verbalized understanding of our conversation and had no further questions at this time.

## 2016-12-02 NOTE — Telephone Encounter (Signed)
Spoke with patient and let her know that I left prescription up front for her to pick up.

## 2016-12-15 DIAGNOSIS — D0472 Carcinoma in situ of skin of left lower limb, including hip: Secondary | ICD-10-CM | POA: Diagnosis not present

## 2017-02-03 ENCOUNTER — Telehealth: Payer: Self-pay | Admitting: Family Medicine

## 2017-02-03 DIAGNOSIS — E7800 Pure hypercholesterolemia, unspecified: Secondary | ICD-10-CM

## 2017-02-03 DIAGNOSIS — E559 Vitamin D deficiency, unspecified: Secondary | ICD-10-CM

## 2017-02-03 DIAGNOSIS — E119 Type 2 diabetes mellitus without complications: Secondary | ICD-10-CM

## 2017-02-03 DIAGNOSIS — E039 Hypothyroidism, unspecified: Secondary | ICD-10-CM

## 2017-02-03 NOTE — Telephone Encounter (Signed)
-----   Message from Ellamae Sia sent at 01/21/2017  4:02 PM EST ----- Regarding: Lab orders for Friday, 2.9.18 Lab orders for a DM f/u appt

## 2017-02-04 ENCOUNTER — Other Ambulatory Visit (INDEPENDENT_AMBULATORY_CARE_PROVIDER_SITE_OTHER): Payer: Medicare Other

## 2017-02-04 DIAGNOSIS — E119 Type 2 diabetes mellitus without complications: Secondary | ICD-10-CM | POA: Diagnosis not present

## 2017-02-04 DIAGNOSIS — E7800 Pure hypercholesterolemia, unspecified: Secondary | ICD-10-CM | POA: Diagnosis not present

## 2017-02-04 LAB — LIPID PANEL
CHOL/HDL RATIO: 3
Cholesterol: 141 mg/dL (ref 0–200)
HDL: 43.4 mg/dL (ref >39.00)
LDL Cholesterol: 78 mg/dL (ref 0–99)
NonHDL: 97.56
TRIGLYCERIDES: 97 mg/dL (ref 0.0–149.0)
VLDL: 19.4 mg/dL (ref 0.0–40.0)

## 2017-02-04 LAB — COMPREHENSIVE METABOLIC PANEL
ALT: 15 U/L (ref 0–35)
AST: 19 U/L (ref 0–37)
Albumin: 4.8 g/dL (ref 3.5–5.2)
Alkaline Phosphatase: 64 U/L (ref 39–117)
BILIRUBIN TOTAL: 0.4 mg/dL (ref 0.2–1.2)
BUN: 29 mg/dL — ABNORMAL HIGH (ref 6–23)
CALCIUM: 10 mg/dL (ref 8.4–10.5)
CO2: 29 meq/L (ref 19–32)
Chloride: 104 mEq/L (ref 96–112)
Creatinine, Ser: 0.83 mg/dL (ref 0.40–1.20)
GFR: 71.74 mL/min (ref >60.00)
GLUCOSE: 152 mg/dL — AB (ref 70–99)
Potassium: 4.2 mEq/L (ref 3.5–5.1)
Sodium: 140 mEq/L (ref 135–145)
Total Protein: 8.1 g/dL (ref 6.0–8.3)

## 2017-02-04 LAB — HEMOGLOBIN A1C: HEMOGLOBIN A1C: 7.4 % — AB (ref 4.6–6.5)

## 2017-02-08 ENCOUNTER — Encounter: Payer: Self-pay | Admitting: Family Medicine

## 2017-02-08 ENCOUNTER — Ambulatory Visit (INDEPENDENT_AMBULATORY_CARE_PROVIDER_SITE_OTHER): Payer: Medicare Other | Admitting: Family Medicine

## 2017-02-08 VITALS — BP 138/66 | HR 70 | Temp 97.6°F | Ht 63.0 in | Wt 153.2 lb

## 2017-02-08 DIAGNOSIS — E118 Type 2 diabetes mellitus with unspecified complications: Secondary | ICD-10-CM

## 2017-02-08 DIAGNOSIS — I10 Essential (primary) hypertension: Secondary | ICD-10-CM | POA: Diagnosis not present

## 2017-02-08 DIAGNOSIS — E7800 Pure hypercholesterolemia, unspecified: Secondary | ICD-10-CM | POA: Diagnosis not present

## 2017-02-08 DIAGNOSIS — Z23 Encounter for immunization: Secondary | ICD-10-CM

## 2017-02-08 DIAGNOSIS — E119 Type 2 diabetes mellitus without complications: Secondary | ICD-10-CM

## 2017-02-08 LAB — HM DIABETES FOOT EXAM

## 2017-02-08 MED ORDER — LISINOPRIL 20 MG PO TABS: ORAL_TABLET | ORAL | 3 refills | Status: DC

## 2017-02-08 MED ORDER — GLUCOSE BLOOD VI STRP: ORAL_STRIP | 3 refills | Status: DC

## 2017-02-08 MED ORDER — GEMFIBROZIL 600 MG PO TABS: 600.0000 mg | ORAL_TABLET | Freq: Every morning | ORAL | 3 refills | Status: DC

## 2017-02-08 MED ORDER — LEVOTHYROXINE SODIUM 75 MCG PO TABS: ORAL_TABLET | ORAL | 3 refills | Status: DC

## 2017-02-08 MED ORDER — SITAGLIPTIN PHOSPHATE 100 MG PO TABS: 100.0000 mg | ORAL_TABLET | Freq: Every day | ORAL | 3 refills | Status: DC

## 2017-02-08 NOTE — Progress Notes (Signed)
Subjective:    Patient ID: Renee [name], female    DOB: October 22, 1946, 71 y.o.   MRN: [id number]  HPI   71 year old female presents for 6 months follow up.  Hypertension:    Good control on lisinopril BP Readings from Last 3 Encounters:  02/08/17 138/66  11/29/16 (!) 146/72  11/17/16 151/65  Using medication without problems or lightheadedness:  none Chest pain with exertion:none Edema:none Short of breath:none Average home BPs: occ checking.. Usually well controlled Other issues:  HX of CAD  Elevated Cholesterol: High risk CVD, hx of CAD . On gemfibrozil, zetia Improved control of LDL since last check LDL down from 121.   Lab Results  Component Value Date   CHOL 141 02/04/2017   HDL 43.40 02/04/2017   LDLCALC 78 02/04/2017   TRIG 97.0 02/04/2017   CHOLHDL 3 02/04/2017  Intolerant of statins. Using medications without problems: Muscle aches:  Diet compliance: poor.[place] poor over holidays Exercise:walking  Other complaints:  Diabetes:   Inadequate control on no med. Worsened since last OV 6 months ago.. From 7.0 to 7.4 now.  SE to metfomin and actos in past.  Lab Results  Component Value Date   HGBA1C 7.4 (H) 02/04/2017  Using medications without difficulties: Hypoglycemic episodes: no Hyperglycemic episodes:none Feet problems: none Blood Sugars averaging: FBS 140-150 eye exam within last year: yes     Review of Systems  Constitutional: Negative for fatigue and fever.  HENT: Negative for ear pain.   Eyes: Positive for redness. Negative for pain.  Respiratory: Negative for chest tightness and shortness of breath.   Cardiovascular: Negative for chest pain, palpitations and leg swelling.  Gastrointestinal: Negative for abdominal pain.  Genitourinary: Negative for dysuria.       Objective:   Physical Exam  Constitutional: Vital signs are normal. She appears well-developed and well-nourished. She is cooperative.  Non-toxic appearance. She does not appear  ill. No distress.  HENT:  Head: Normocephalic.  Right Ear: Hearing, tympanic membrane, external ear and ear canal normal. Tympanic membrane is not erythematous, not retracted and not bulging.  Left Ear: Hearing, tympanic membrane, external ear and ear canal normal. Tympanic membrane is not erythematous, not retracted and not bulging.  Nose: No mucosal edema or rhinorrhea. Right sinus exhibits no maxillary sinus tenderness and no frontal sinus tenderness. Left sinus exhibits no maxillary sinus tenderness and no frontal sinus tenderness.  Mouth/Throat: Uvula is midline, oropharynx is clear and moist and mucous membranes are normal.  Eyes: Conjunctivae, EOM and lids are normal. Pupils are equal, round, and reactive to light. Lids are everted and swept, no foreign bodies found.  Neck: Trachea normal and normal range of motion. Neck supple. Carotid bruit is not present. No thyroid mass and no thyromegaly present.  Cardiovascular: Normal rate, regular rhythm, S1 normal, S2 normal, normal heart sounds, intact distal pulses and normal pulses.  Exam reveals no gallop and no friction rub.   No murmur heard. Pulmonary/Chest: Effort normal and breath sounds normal. No tachypnea. No respiratory distress. She has no decreased breath sounds. She has no wheezes. She has no rhonchi. She has no rales.  Abdominal: Soft. Normal appearance and bowel sounds are normal. There is no tenderness.  Neurological: She is alert.  Skin: Skin is warm, dry and intact. No rash noted.  Psychiatric: Her speech is normal and behavior is normal. Judgment and thought content normal. Her mood appears not anxious. Cognition and memory are normal. She does not exhibit a  depressed mood.     Diabetic foot exam: Normal inspection No skin breakdown  Bilateral calluses  Normal DP pulses Normal sensation to light touch and monofilament Nails normal      Assessment & Plan:

## 2017-02-08 NOTE — Assessment & Plan Note (Signed)
Well controlled. Continue current medication. Encouraged exercise, weight loss, healthy eating habits.  

## 2017-02-08 NOTE — Addendum Note (Signed)
Addended by: Carter Kitten on: 02/08/2017 04:07 PM   Modules accepted: Orders

## 2017-02-08 NOTE — Patient Instructions (Addendum)
Decrease bread pasta potatos, sweets.  Increase exercise as tolerated.  Start sitagliptin [name]) daily for diabtes control.

## 2017-02-08 NOTE — Assessment & Plan Note (Signed)
Start [place] daily for diabetes control. Follow up in 3 months.  Also continue working on low carb diet.

## 2017-02-08 NOTE — Assessment & Plan Note (Signed)
Improved control with zetia and gemfibrozil. Intolerant of statins.

## 2017-02-08 NOTE — Progress Notes (Signed)
Pre visit review using our clinic review tool, if applicable. No additional management support is needed unless otherwise documented below in the visit note. 

## 2017-02-15 ENCOUNTER — Telehealth: Payer: Self-pay

## 2017-02-15 NOTE — Telephone Encounter (Signed)
Please find out if this is metformin XR or not.. It will change the dosing.

## 2017-02-15 NOTE — Telephone Encounter (Signed)
Pt said Januvia was too expensive; pt did not get medication. Pt has called ins co and cannot tell us any substitute med that would be more affordable. Pt spoke with her daughter and pts daughter gave pt metformin 1000 mg.  Pt said she has tried metformin 8-10 years ago but she did not take with food. Pt request cb with how Dr Diona Browner wants her to take the Metformin 1000 mg.Please advise.

## 2017-02-15 NOTE — Telephone Encounter (Signed)
Okay to take 500 mg once daily if it is  the XR.  If it is the short acting.. She needs to take it twice daily to last all day. If this is confusing to figure out . We can just send in rx for her and she can throw away the other. Add to med list. MAke sure she has DM follow up 3 months.

## 2017-02-15 NOTE — Telephone Encounter (Signed)
Metformin 1000 mg tablet.  She is currently taking 1/2 tablet daily.  She is taking with food.  Her daughter gave her the bottle of the the metformin 1000 mg that she had.  She just wants to make sure it is okay with Dr. Diona Browner that she is taking 1/2 tablet.  Please advise.

## 2017-02-16 NOTE — Telephone Encounter (Signed)
Ms. Giffen notified as instructed by telephone.  She will start taking 1/2 tablet 2 times a day.  She already has a 3 month follow up scheduled for May.  Medication list updated.

## 2017-04-15 DIAGNOSIS — Z86010 Personal history of colonic polyps: Secondary | ICD-10-CM | POA: Diagnosis not present

## 2017-04-20 DIAGNOSIS — H2511 Age-related nuclear cataract, right eye: Secondary | ICD-10-CM | POA: Diagnosis not present

## 2017-04-20 LAB — HM DIABETES EYE EXAM

## 2017-04-22 ENCOUNTER — Encounter: Payer: Self-pay | Admitting: Family Medicine

## 2017-05-13 ENCOUNTER — Encounter
Admission: RE | Disposition: A | Discharge status: Home / Self Care | Payer: Self-pay | Source: Ambulatory Visit | Attending: Unknown Physician Specialty

## 2017-05-13 ENCOUNTER — Encounter: Payer: Self-pay | Admitting: *Deleted

## 2017-05-13 ENCOUNTER — Ambulatory Visit: Payer: Medicare Other | Admitting: Anesthesiology

## 2017-05-13 ENCOUNTER — Ambulatory Visit
Admission: RE | Admit: 2017-05-13 | Discharge: 2017-05-13 | Disposition: A | Discharge status: Home / Self Care | Payer: Medicare Other | Source: Ambulatory Visit | Attending: Unknown Physician Specialty | Admitting: Unknown Physician Specialty

## 2017-05-13 DIAGNOSIS — Z87442 Personal history of urinary calculi: Secondary | ICD-10-CM | POA: Insufficient documentation

## 2017-05-13 DIAGNOSIS — L409 Psoriasis, unspecified: Secondary | ICD-10-CM | POA: Insufficient documentation

## 2017-05-13 DIAGNOSIS — M1990 Unspecified osteoarthritis, unspecified site: Secondary | ICD-10-CM | POA: Insufficient documentation

## 2017-05-13 DIAGNOSIS — Z86010 Personal history of colonic polyps: Secondary | ICD-10-CM | POA: Insufficient documentation

## 2017-05-13 DIAGNOSIS — I10 Essential (primary) hypertension: Secondary | ICD-10-CM | POA: Insufficient documentation

## 2017-05-13 DIAGNOSIS — K5730 Diverticulosis of large intestine without perforation or abscess without bleeding: Secondary | ICD-10-CM | POA: Insufficient documentation

## 2017-05-13 DIAGNOSIS — Z1211 Encounter for screening for malignant neoplasm of colon: Secondary | ICD-10-CM | POA: Diagnosis not present

## 2017-05-13 DIAGNOSIS — Z96653 Presence of artificial knee joint, bilateral: Secondary | ICD-10-CM | POA: Insufficient documentation

## 2017-05-13 DIAGNOSIS — E119 Type 2 diabetes mellitus without complications: Secondary | ICD-10-CM | POA: Insufficient documentation

## 2017-05-13 DIAGNOSIS — K5790 Diverticulosis of intestine, part unspecified, without perforation or abscess without bleeding: Secondary | ICD-10-CM | POA: Diagnosis not present

## 2017-05-13 DIAGNOSIS — K648 Other hemorrhoids: Secondary | ICD-10-CM | POA: Diagnosis not present

## 2017-05-13 DIAGNOSIS — E039 Hypothyroidism, unspecified: Secondary | ICD-10-CM | POA: Insufficient documentation

## 2017-05-13 DIAGNOSIS — E785 Hyperlipidemia, unspecified: Secondary | ICD-10-CM | POA: Diagnosis not present

## 2017-05-13 DIAGNOSIS — I2510 Atherosclerotic heart disease of native coronary artery without angina pectoris: Secondary | ICD-10-CM | POA: Diagnosis not present

## 2017-05-13 DIAGNOSIS — Z9851 Tubal ligation status: Secondary | ICD-10-CM | POA: Diagnosis not present

## 2017-05-13 HISTORY — PX: COLONOSCOPY WITH PROPOFOL: SHX5780

## 2017-05-13 LAB — GLUCOSE, CAPILLARY: Glucose-Capillary: 127 mg/dL — ABNORMAL HIGH (ref 65–99)

## 2017-05-13 SURGERY — COLONOSCOPY WITH PROPOFOL
Anesthesia: General

## 2017-05-13 MED ORDER — PIPERACILLIN-TAZOBACTAM 3.375 G IVPB 30 MIN
3.3750 g | Freq: Once | INTRAVENOUS | Status: AC
  Administered 2017-05-13: 3.375 g via INTRAVENOUS
  Filled 2017-05-13: qty 50

## 2017-05-13 MED ORDER — SODIUM CHLORIDE 0.9 % IV SOLN
INTRAVENOUS | Status: DC
  Administered 2017-05-13: 1000 mL via INTRAVENOUS

## 2017-05-13 MED ORDER — SODIUM CHLORIDE 0.9 % IV SOLN: INTRAVENOUS | Status: DC

## 2017-05-13 MED ORDER — PROPOFOL 10 MG/ML IV BOLUS
INTRAVENOUS | Status: DC | PRN
  Administered 2017-05-13: 80 mg via INTRAVENOUS

## 2017-05-13 MED ORDER — PROPOFOL 500 MG/50ML IV EMUL
INTRAVENOUS | Status: DC | PRN
  Administered 2017-05-13: 100 ug/kg/min via INTRAVENOUS

## 2017-05-13 MED ORDER — PROPOFOL 500 MG/50ML IV EMUL
INTRAVENOUS | Status: AC
  Filled 2017-05-13: qty 50

## 2017-05-13 NOTE — Anesthesia Post-op Follow-up Note (Cosign Needed)
Anesthesia QCDR form completed.        

## 2017-05-13 NOTE — Transfer of Care (Signed)
Immediate Anesthesia Transfer of Care Note  [name]  Procedure(s) Performed: Procedure(s): COLONOSCOPY WITH PROPOFOL (N/A)  Patient Location: PACU and Endoscopy Unit  Anesthesia Type:General  Level of Consciousness: drowsy and patient cooperative  Airway & Oxygen Therapy: Patient Spontanous Breathing and Patient connected to nasal cannula oxygen  Post-op Assessment: Report given to RN and Post -op Vital signs reviewed and stable  Post vital signs: Reviewed and stable  Last Vitals:  Vitals:   05/13/17 0750 05/13/17 0858  BP: 140/71 112/64  Pulse: 79 70  Resp: 18 20  Temp: 36.5 C 36.2 C    Last Pain:  Vitals:   05/13/17 0858  TempSrc: Tympanic         Complications: No apparent anesthesia complications

## 2017-05-13 NOTE — Anesthesia Preprocedure Evaluation (Addendum)
Anesthesia Evaluation  Patient identified by MRN, date of birth, ID band Patient awake    Reviewed: Allergy & Precautions, H&P , NPO status , Patient's Chart, lab work & pertinent test results  Airway Mallampati: II  TM Distance: >3 FB Neck ROM: full    Dental  (+) Upper Dentures   Pulmonary neg pulmonary ROS,    Pulmonary exam normal breath sounds clear to auscultation       Cardiovascular Exercise Tolerance: Good hypertension, Pt. on medications + CAD  Normal cardiovascular exam Rhythm:regular Rate:Normal     Neuro/Psych claustrophobianegative neurological ROS  negative psych ROS   GI/Hepatic Neg liver ROS, Hx of colon polyps   Endo/Other  diabetes, Well Controlled, Type 2Hypothyroidism Diet controlled borderline diabetic  Renal/GU negative Renal ROSstones  negative genitourinary   Musculoskeletal  (+) Arthritis , Osteoarthritis,    Abdominal   Peds negative pediatric ROS (+)  Hematology negative hematology ROS (+)   Anesthesia Other Findings Past Medical History: No date: Allergy No date: Arthritis No date: CAD (coronary artery disease)     Comment: non-obstructive. cath 11/11 No date: Diabetes mellitus     Comment: borderline diabetic - diet controlled  No date: History of chicken pox No date: History of phobia     Comment: clastrophobia No date: Hyperlipidemia No date: Hypertension No date: Hypothyroidism No date: Kidney stones     Comment: hx of  No date: Psoriasis No date: Urinary tract infection     Comment: hx of   Reproductive/Obstetrics negative OB ROS                            Anesthesia Physical  Anesthesia Plan  ASA: III  Anesthesia Plan: General   Post-op Pain Management:    Induction: Intravenous  Airway Management Planned: Nasal Cannula  Additional Equipment:   Intra-op Plan:   Post-operative Plan:   Informed Consent: I have reviewed the  patients History and Physical, chart, labs and discussed the procedure including the risks, benefits and alternatives for the proposed anesthesia with the patient or authorized representative who has indicated his/her understanding and acceptance.   Dental Advisory Given  Plan Discussed with: CRNA and Surgeon  Anesthesia Plan Comments:        Anesthesia Quick Evaluation

## 2017-05-13 NOTE — Op Note (Signed)
[facility] Gastroenterology Patient Name: Renee [name] Procedure Date: 05/13/2017 8:25 AM MRN: [id number] Account #: [id number] Date of Birth: August 28, 1946 Admit Type: Outpatient Age: 71 Room: [facility] ENDO ROOM 1 Gender: Female Note Status: Finalized Procedure:            Colonoscopy Indications:          High risk colon cancer surveillance: Personal history                        of colonic polyps Providers:            Manya Silvas, MD Referring MD:         Jinny Sanders MD, MD (Referring MD) Medicines:            Propofol per Anesthesia Complications:        No immediate complications. Procedure:            Pre-Anesthesia Assessment:                       - After reviewing the risks and benefits, the patient                        was deemed in satisfactory condition to undergo the                        procedure.                       After obtaining informed consent, the colonoscope was                        passed under direct vision. Throughout the procedure,                        the patient's blood pressure, pulse, and oxygen                        saturations were monitored continuously. The                        Colonoscope was introduced through the anus and                        advanced to the the cecum, identified by appendiceal                        orifice and ileocecal valve. The colonoscopy was                        somewhat difficult due to restricted mobility of the                        colon. Successful completion of the procedure was aided                        by applying abdominal pressure. The patient tolerated                        the procedure well. The quality of the bowel  preparation was excellent. Findings:      Many small-mouthed diverticula were found in the sigmoid colon.      Internal hemorrhoids were found during endoscopy. The hemorrhoids were       small and Grade I (internal hemorrhoids  that do not prolapse).      The exam was otherwise without abnormality. Impression:           - Diverticulosis in the sigmoid colon.                       - Internal hemorrhoids.                       - The examination was otherwise normal.                       - No specimens collected. Recommendation:       - Repeat colonoscopy in 5 years for surveillance. Manya Silvas, MD 05/13/2017 8:57:17 AM This report has been signed electronically. Number of Addenda: 0 Note Initiated On: 05/13/2017 8:25 AM Scope Withdrawal Time: 0 hours 13 minutes 44 seconds  Total Procedure Duration: 0 hours 23 minutes 7 seconds       [facility]

## 2017-05-13 NOTE — H&P (Signed)
Primary Care Physician:  Jinny Sanders, MD Primary Gastroenterologist:  Dr. Vira Agar  Pre-Procedure History & Physical: HPI:  Renee [name] is a 71 y.o. female is here for an colonoscopy.   Past Medical History:  Diagnosis Date  . Allergy   . Arthritis   . CAD (coronary artery disease)    non-obstructive. cath 11/11  . Diabetes mellitus    borderline diabetic - diet controlled   . History of chicken pox   . History of phobia    clastrophobia  . Hyperlipidemia   . Hypertension   . Hypothyroidism   . Kidney stones    hx of   . Psoriasis   . Urinary tract infection    hx of     Past Surgical History:  Procedure Laterality Date  . APPENDECTOMY    . CARDIAC CATHETERIZATION     2011  . CARPAL TUNNEL RELEASE     Left trigger finger  . EYE SURGERY     lasik - right eye   . TOTAL KNEE ARTHROPLASTY Bilateral 06/12/2015   Procedure: TOTAL KNEE BILATERAL;  Surgeon: Gaynelle Arabian, MD;  Location: [facility] ORS;  Service: Orthopedics;  Laterality: Bilateral;  and epidural  . TUBAL LIGATION    . Tubes tied     and untied. and tied again    Prior to Admission medications   Medication Sig Start Date End Date Taking? Authorizing Provider  Multiple Vitamin (MULTIVITAMIN) tablet Take 1 tablet by mouth daily.   Yes [provider]  omega-3 acid ethyl esters (LOVAZA) 1 g capsule Take 2 g by mouth daily.   Yes [provider]  acetaminophen (TYLENOL) 500 MG tablet Take 1,000 mg by mouth every 6 (six) hours as needed for moderate pain or headache.    [provider]  Blood Glucose Monitoring Suppl (ACCU-CHEK COMPACT CARE KIT) KIT Meter Kit 01/29/16   Margarita Rana, MD  diphenhydrAMINE (BENADRYL) 25 MG tablet Take 1 tablet (25 mg total) by mouth at bedtime as needed for sleep. Patient taking differently: Take 25 mg by mouth at bedtime as needed for allergies.  06/17/15   Perkins, Alexzandrew L, PA-C  diphenhydramine-acetaminophen (TYLENOL PM) 25-500 MG TABS tablet Take 1  tablet by mouth at bedtime as needed (sleep/pain).    [provider]  Emollient (CERAVE) CREA Apply 1 application topically daily as needed (psoriasis).     [provider]  ezetimibe (ZETIA) 10 MG tablet Take 1 tablet (10 mg total) by mouth daily. 12/01/16 03/01/17  Minna Merritts, MD  gemfibrozil (LOPID) 600 MG tablet Take 1 tablet (600 mg total) by mouth every morning. 02/08/17   Bedsole, Amy E, MD  glucose blood (ACCU-CHEK COMPACT PLUS) test strip Use to check sugar once time daily.  Dx: E11.9 02/08/17   Bedsole, Mervyn Gay, MD  levothyroxine (SYNTHROID, LEVOTHROID) 75 MCG tablet TAKE 75 mcg by mouth once EVERY DAY 02/08/17   Bedsole, Amy E, MD  lisinopril (PRINIVIL,ZESTRIL) 20 MG tablet TAKE '20mg'$  by mouth once EVERY DAY 02/08/17   Bedsole, Amy E, MD  magic mouthwash w/lidocaine SOLN Take 5 mLs by mouth 3 (three) times daily as needed for mouth pain.    [provider]  nitroGLYCERIN (NITROSTAT) 0.4 MG SL tablet Place 1 tablet (0.4 mg total) under the tongue every 5 (five) minutes as needed. 09/10/13   Minna Merritts, MD  PRESCRIPTION MEDICATION Apply 1 application topically 2 (two) times daily as needed (rash/irritation and flare ups). Prescription Ointment  for psoriasis on hands.    [provider]  triamcinolone (KENALOG) 0.1 % cream Apply 1 application topically at bedtime as needed (rash/ irritation).     [provider]  VITAMIN D, CHOLECALCIFEROL, PO Take 2,000 Units by mouth daily.     [provider]    Allergies as of 04/18/2017 - Review Complete 02/08/2017  Allergen Reaction Noted  . Other Rash 06/06/2015  . Codeine Nausea And Vomiting   . Metformin Nausea And Vomiting   . Robaxin [methocarbamol] Nausea Only 10/20/2015  . Simvastatin Swelling and Rash     Family History  Problem Relation Age of Onset  . COPD Mother   . Diabetes Sister   . Hypertension Sister   . Diabetes Brother   . Arthritis Brother   . Arthritis Brother      Social History   Social History  . Marital status: Widowed    Spouse name: N/A  . Number of children: N/A  . Years of education: N/A   Occupational History  . Retired    Social History Main Topics  . Smoking status: Never Smoker  . Smokeless tobacco: Never Used  . Alcohol use No     Comment: very little around holidays  . Drug use: No  . Sexual activity: No   Other Topics Concern  . Not on file   Social History Narrative   Regular exercise.    Divorced   Retired at [employer] in [employer], Loss adjuster, chartered.    Review of Systems: See HPI, otherwise negative ROS  Physical Exam: BP 140/71   Pulse 79   Temp 97.7 F (36.5 C) (Tympanic)   Resp 18   Ht 5' 4.5" (1.638 m)   Wt 68 kg (150 lb)   SpO2 98%   BMI 25.35 kg/m  General:   Alert,  pleasant and cooperative in NAD Head:  Normocephalic and atraumatic. Neck:  Supple; no masses or thyromegaly. Lungs:  Clear throughout to auscultation.    Heart:  Regular rate and rhythm. Abdomen:  Soft, nontender and nondistended. Normal bowel sounds, without guarding, and without rebound.   Neurologic:  Alert and  oriented x4;  grossly normal neurologically.  Impression/Plan: [name] is here for an colonoscopy to be performed for [facility] colon polyps  Risks, benefits, limitations, and alternatives regarding  colonoscopy have been reviewed with the patient.  Questions have been answered.  All parties agreeable.   Gaylyn Cheers, MD  05/13/2017, 8:13 AM

## 2017-05-13 NOTE — Anesthesia Postprocedure Evaluation (Signed)
Anesthesia Post Note  [name]  Procedure(s) Performed: Procedure(s) (LRB): COLONOSCOPY WITH PROPOFOL (N/A)  Patient location during evaluation: PACU Anesthesia Type: General Level of consciousness: awake and alert and oriented Pain management: pain level controlled Vital Signs Assessment: post-procedure vital signs reviewed and stable Respiratory status: spontaneous breathing Cardiovascular status: blood pressure returned to baseline Anesthetic complications: no     Last Vitals:  Vitals:   05/13/17 0928 05/13/17 0938  BP: (!) 131/52 (!) 143/55  Pulse: 62 61  Resp: 14 14  Temp:      Last Pain:  Vitals:   05/13/17 0858  TempSrc: Tympanic                 Ar[place] Moist

## 2017-05-16 ENCOUNTER — Telehealth: Payer: Self-pay | Admitting: Family Medicine

## 2017-05-16 ENCOUNTER — Other Ambulatory Visit (INDEPENDENT_AMBULATORY_CARE_PROVIDER_SITE_OTHER): Payer: Medicare Other

## 2017-05-16 ENCOUNTER — Encounter (INDEPENDENT_AMBULATORY_CARE_PROVIDER_SITE_OTHER): Payer: Self-pay

## 2017-05-16 DIAGNOSIS — E119 Type 2 diabetes mellitus without complications: Secondary | ICD-10-CM

## 2017-05-16 DIAGNOSIS — E7800 Pure hypercholesterolemia, unspecified: Secondary | ICD-10-CM

## 2017-05-16 LAB — HEMOGLOBIN A1C: HEMOGLOBIN A1C: 6.9 % — AB (ref 4.6–6.5)

## 2017-05-16 NOTE — Telephone Encounter (Signed)
-----   Message from Ellamae Sia sent at 05/10/2017 11:23 AM EDT ----- Regarding: Lab orders for Monday, 5.21.18 Lab orders for DM f/u

## 2017-05-19 ENCOUNTER — Encounter: Payer: Self-pay | Admitting: Unknown Physician Specialty

## 2017-05-20 ENCOUNTER — Encounter: Payer: Self-pay | Admitting: Family Medicine

## 2017-05-20 ENCOUNTER — Ambulatory Visit (INDEPENDENT_AMBULATORY_CARE_PROVIDER_SITE_OTHER): Payer: Medicare Other | Admitting: Family Medicine

## 2017-05-20 ENCOUNTER — Telehealth: Payer: Self-pay | Admitting: *Deleted

## 2017-05-20 VITALS — BP 100/60 | HR 81 | Temp 98.3°F | Ht 63.0 in | Wt 149.2 lb

## 2017-05-20 DIAGNOSIS — E119 Type 2 diabetes mellitus without complications: Secondary | ICD-10-CM | POA: Diagnosis not present

## 2017-05-20 DIAGNOSIS — M79674 Pain in right toe(s): Secondary | ICD-10-CM

## 2017-05-20 DIAGNOSIS — I10 Essential (primary) hypertension: Secondary | ICD-10-CM

## 2017-05-20 LAB — URIC ACID: URIC ACID, SERUM: 8.3 mg/dL — AB (ref 2.4–7.0)

## 2017-05-20 MED ORDER — COLCRYS 0.6 MG PO TABS: 0.6000 mg | ORAL_TABLET | Freq: Two times a day (BID) | ORAL | 0 refills | Status: DC

## 2017-05-20 NOTE — Assessment & Plan Note (Signed)
Likely gout.  Eval with uric acid. Use ibuprofen for pain for now as improving.

## 2017-05-20 NOTE — Assessment & Plan Note (Signed)
Well controlled. Continue current medication.  

## 2017-05-20 NOTE — Patient Instructions (Addendum)
Please stop at the lab to set up to have labs drawn. Use ibuprofen for pain in toe for now.  Keep up regular exercise and healthy low carb diet.

## 2017-05-20 NOTE — Assessment & Plan Note (Signed)
Working on diet changes.. Could not tolerate metformin or afford [place].  A1C improved. Encouraged exercise, weight loss, healthy eating habits.

## 2017-05-20 NOTE — Telephone Encounter (Signed)
-----   Message from Jinny Sanders, MD sent at 05/20/2017  5:18 PM EDT ----- Notify pt that uric acid is very high.. Pain in foot is likely gout.  Start colchicine twice daily until pain resolved. Mail info on low uric acid diet.  Call in colcrys 0.6 mg Bid 3 20, 0 rf.

## 2017-05-20 NOTE — Telephone Encounter (Signed)
Left message for Renee [name] that her Uric Acid came back very high.  Pain in foot likely due to Gout.  Rx sent to [facility] on [address],  for Colcrys 0.6 mg to take one tablet twice a day until pain resolves.  Low Purine diet mailed to patient.

## 2017-05-20 NOTE — Progress Notes (Signed)
Subjective:    Patient ID: Renee [name], female    DOB: 07-30-1946, 71 y.o.   MRN: [id number]  HPI   71 year old female presents for follow up Dm. Diabetes:   Much improved  Took 30 days of metformin 1/2 tab.. But caused Nausea/diarrhea.Renee [name] was too expensive Lab Results  Component Value Date   HGBA1C 6.9 (H) 05/16/2017  Using medications without difficulties: Hypoglycemic episodes:none Hyperglycemic episodes: rare Feet problems: no ulcers Blood Sugars averaging: FBS on no med in last month 130-140, 2 hours after meals 170 eye exam within last year: She has been working on low carb diet. She has lost weight in last 3 month.  She is now walking several days a week. Gardening. Wt Readings from Last 3 Encounters:  05/20/17 149 lb 4 oz (67.7 kg)  05/13/17 150 lb (68 kg)  02/08/17 153 lb 4 oz (69.5 kg)   Hypertension:    BP Readings from Last 3 Encounters:  05/20/17 100/60  05/13/17 (!) 143/55  02/08/17 138/66   Using medication without problems or lightheadedness:  Chest pain with exertion: Edema: Short of breath: Average home BPs: Other issues:  She has had pain in right great toe MCP joint.. Redness, pain, swelling, sensitive to touch, trouble with weight. X 2-3 months off and on.  She has taken advil which has helped some.  she does eat a lot of shrimp.     Hx of similar in past. Fracture in toe, many years ago.  Review of Systems  Constitutional: Negative for fatigue and fever.  HENT: Negative for ear pain.   Eyes: Negative for pain.  Respiratory: Negative for chest tightness and shortness of breath.   Cardiovascular: Negative for chest pain, palpitations and leg swelling.  Gastrointestinal: Negative for abdominal pain.  Genitourinary: Negative for dysuria.       Objective:   Physical Exam  Constitutional: Vital signs are normal. She appears well-developed and well-nourished. She is cooperative.  Non-toxic appearance. She does not appear ill. No  distress.  HENT:  Head: Normocephalic.  Right Ear: Hearing, tympanic membrane, external ear and ear canal normal. Tympanic membrane is not erythematous, not retracted and not bulging.  Left Ear: Hearing, tympanic membrane, external ear and ear canal normal. Tympanic membrane is not erythematous, not retracted and not bulging.  Nose: No mucosal edema or rhinorrhea. Right sinus exhibits no maxillary sinus tenderness and no frontal sinus tenderness. Left sinus exhibits no maxillary sinus tenderness and no frontal sinus tenderness.  Mouth/Throat: Uvula is midline, oropharynx is clear and moist and mucous membranes are normal.  Eyes: Conjunctivae, EOM and lids are normal. Pupils are equal, round, and reactive to light. Lids are everted and swept, no foreign bodies found.  Neck: Trachea normal and normal range of motion. Neck supple. Carotid bruit is not present. No thyroid mass and no thyromegaly present.  Cardiovascular: Normal rate, regular rhythm, S1 normal, S2 normal, normal heart sounds, intact distal pulses and normal pulses.  Exam reveals no gallop and no friction rub.   No murmur heard. Pulmonary/Chest: Effort normal and breath sounds normal. No tachypnea. No respiratory distress. She has no decreased breath sounds. She has no wheezes. She has no rhonchi. She has no rales.  Abdominal: Soft. Normal appearance and bowel sounds are normal. There is no tenderness.  Neurological: She is alert.  Skin: Skin is warm, dry and intact. No rash noted.  Psychiatric: Her speech is normal and behavior is normal. Judgment and thought content normal.  Her mood appears not anxious. Cognition and memory are normal. She does not exhibit a depressed mood.   Diabetic foot exam: Normal inspection.. Points to pain in right great toe MCP joint, no current redness.. Mild soreness and decreased ROM. No skin breakdown No calluses  Normal DP pulses Normal sensation to light touch and monofilament Nails  normal         Assessment & Plan:

## 2017-06-23 ENCOUNTER — Ambulatory Visit: Payer: Medicare Other | Admitting: Family Medicine

## 2017-07-25 DIAGNOSIS — L403 Pustulosis palmaris et plantaris: Secondary | ICD-10-CM | POA: Diagnosis not present

## 2017-07-25 DIAGNOSIS — L570 Actinic keratosis: Secondary | ICD-10-CM | POA: Diagnosis not present

## 2017-07-25 DIAGNOSIS — Z85828 Personal history of other malignant neoplasm of skin: Secondary | ICD-10-CM | POA: Diagnosis not present

## 2017-07-25 DIAGNOSIS — L821 Other seborrheic keratosis: Secondary | ICD-10-CM | POA: Diagnosis not present

## 2017-07-25 DIAGNOSIS — X32XXXA Exposure to sunlight, initial encounter: Secondary | ICD-10-CM | POA: Diagnosis not present

## 2017-09-08 DIAGNOSIS — J301 Allergic rhinitis due to pollen: Secondary | ICD-10-CM | POA: Diagnosis not present

## 2017-09-08 DIAGNOSIS — R51 Headache: Secondary | ICD-10-CM | POA: Diagnosis not present

## 2017-09-08 DIAGNOSIS — J342 Deviated nasal septum: Secondary | ICD-10-CM | POA: Diagnosis not present

## 2017-09-08 DIAGNOSIS — H6060 Unspecified chronic otitis externa, unspecified ear: Secondary | ICD-10-CM | POA: Diagnosis not present

## 2017-09-15 DIAGNOSIS — J342 Deviated nasal septum: Secondary | ICD-10-CM | POA: Diagnosis not present

## 2017-09-20 ENCOUNTER — Ambulatory Visit (INDEPENDENT_AMBULATORY_CARE_PROVIDER_SITE_OTHER): Payer: Medicare Other

## 2017-09-20 ENCOUNTER — Telehealth: Payer: Self-pay | Admitting: Family Medicine

## 2017-09-20 VITALS — BP 126/80 | HR 60 | Temp 97.6°F | Ht 63.0 in | Wt 149.5 lb

## 2017-09-20 DIAGNOSIS — Z23 Encounter for immunization: Secondary | ICD-10-CM

## 2017-09-20 DIAGNOSIS — E119 Type 2 diabetes mellitus without complications: Secondary | ICD-10-CM

## 2017-09-20 DIAGNOSIS — E7800 Pure hypercholesterolemia, unspecified: Secondary | ICD-10-CM | POA: Diagnosis not present

## 2017-09-20 DIAGNOSIS — Z0000 Encounter for general adult medical examination without abnormal findings: Secondary | ICD-10-CM | POA: Diagnosis not present

## 2017-09-20 DIAGNOSIS — E039 Hypothyroidism, unspecified: Secondary | ICD-10-CM

## 2017-09-20 LAB — COMPREHENSIVE METABOLIC PANEL
ALT: 11 U/L (ref 0–35)
AST: 17 U/L (ref 0–37)
Albumin: 4.5 g/dL (ref 3.5–5.2)
Alkaline Phosphatase: 57 U/L (ref 39–117)
BILIRUBIN TOTAL: 0.4 mg/dL (ref 0.2–1.2)
BUN: 19 mg/dL (ref 6–23)
CO2: 30 meq/L (ref 19–32)
CREATININE: 0.69 mg/dL (ref 0.40–1.20)
Calcium: 10 mg/dL (ref 8.4–10.5)
Chloride: 105 mEq/L (ref 96–112)
GFR: 88.63 mL/min (ref >60.00)
GLUCOSE: 125 mg/dL — AB (ref 70–99)
Potassium: 4.2 mEq/L (ref 3.5–5.1)
Sodium: 141 mEq/L (ref 135–145)
Total Protein: 7.7 g/dL (ref 6.0–8.3)

## 2017-09-20 LAB — LIPID PANEL
Cholesterol: 130 mg/dL (ref 0–200)
HDL: 41 mg/dL (ref >39.00)
LDL Cholesterol: 77 mg/dL (ref 0–99)
NONHDL: 88.94
Total CHOL/HDL Ratio: 3
Triglycerides: 62 mg/dL (ref 0.0–149.0)
VLDL: 12.4 mg/dL (ref 0.0–40.0)

## 2017-09-20 LAB — T3, FREE: T3, Free: 2.9 pg/mL (ref 2.3–4.2)

## 2017-09-20 LAB — T4, FREE: Free T4: 0.89 ng/dL (ref 0.60–1.60)

## 2017-09-20 LAB — TSH: TSH: 2.8 u[IU]/mL (ref 0.35–4.50)

## 2017-09-20 LAB — HEMOGLOBIN A1C: Hgb A1c MFr Bld: 6.7 % — ABNORMAL HIGH (ref 4.6–6.5)

## 2017-09-20 NOTE — Telephone Encounter (Signed)
-----   Message from Eustace Pen, LPN sent at 2/77/8242  7:57 PM EDT ----- Regarding: Labs 9/25 Lab orders needed. If none, please advise.Thank you.   Medicare

## 2017-09-20 NOTE — Progress Notes (Signed)
Pre visit review using our clinic review tool, if applicable. No additional management support is needed unless otherwise documented below in the visit note. 

## 2017-09-20 NOTE — Progress Notes (Signed)
PCP notes:   Health maintenance:  Flu vaccine - administered  Abnormal screenings:   None  Patient concerns:   None  Nurse concerns:  None  Next PCP appt:   09/23/17 @ 1115

## 2017-09-20 NOTE — Progress Notes (Signed)
Subjective:   Renee [name] is a 71 y.o. female who presents for Medicare Annual (Subsequent) preventive examination.  Review of Systems:  N/A Cardiac Risk Factors include: advanced age (>29mn, >>55women);diabetes mellitus;dyslipidemia;hypertension     Objective:     Vitals: BP 126/80 (BP Location: Right Arm, Patient Position: Sitting, Cuff Size: Normal)   Pulse 60   Temp 97.6 F (36.4 C) (Oral)   Ht _0  (1.6 m) Comment: no shoes  Wt 149 lb 8 oz (67.8 kg)   SpO2 97%   BMI 26.48 kg/m   Body mass index is 26.48 kg/m.   Tobacco History  Smoking Status  . Never Smoker  Smokeless Tobacco  . Never Used     Counseling given: No   Past Medical History:  Diagnosis Date  . Allergy   . Arthritis   . CAD (coronary artery disease)    non-obstructive. cath 11/11  . Diabetes mellitus    borderline diabetic - diet controlled   . History of chicken pox   . History of phobia    clastrophobia  . Hyperlipidemia   . Hypertension   . Hypothyroidism   . Kidney stones    hx of   . Psoriasis   . Urinary tract infection    hx of    Past Surgical History:  Procedure Laterality Date  . APPENDECTOMY    . CARDIAC CATHETERIZATION     2011  . CARPAL TUNNEL RELEASE     Left trigger finger  . COLONOSCOPY WITH PROPOFOL N/A 05/13/2017   Procedure: COLONOSCOPY WITH PROPOFOL;  Surgeon: EManya Silvas MD;  [facility];  Service: Endoscopy;  Laterality: N/A;  . EYE SURGERY     lasik - right eye   . TOTAL KNEE ARTHROPLASTY Bilateral 06/12/2015   Procedure: TOTAL KNEE BILATERAL;  Surgeon: FGaynelle Arabian MD;  Location: [facility];  Service: Orthopedics;  Laterality: Bilateral;  and epidural  . TUBAL LIGATION    . Tubes tied     and untied. and tied again   Family History  Problem Relation Age of Onset  . COPD Mother   . Diabetes Sister   . Hypertension Sister   . Diabetes Brother   . Arthritis Brother   . Arthritis Brother    History  Sexual Activity  . Sexual  activity: No    Outpatient Encounter Prescriptions as of 09/20/2017  Medication Sig  . acetaminophen (TYLENOL) 500 MG tablet Take 1,000 mg by mouth every 6 (six) hours as needed for moderate pain or headache.  . Blood Glucose Monitoring Suppl (ACCU-CHEK COMPACT CARE KIT) KIT Meter Kit  . diphenhydrAMINE (BENADRYL) 25 MG tablet Take 1 tablet (25 mg total) by mouth at bedtime as needed for sleep. (Patient taking differently: Take 25 mg by mouth at bedtime as needed for allergies. )  . diphenhydramine-acetaminophen (TYLENOL PM) 25-500 MG TABS tablet Take 1 tablet by mouth at bedtime as needed (sleep/pain).  . Emollient (CERAVE) CREA Apply 1 application topically daily as needed (psoriasis).   .Marland Kitchengemfibrozil (LOPID) 600 MG tablet Take 1 tablet (600 mg total) by mouth every morning.  .Marland Kitchenglucose blood (ACCU-CHEK COMPACT PLUS) test strip Use to check sugar once time daily.  Dx: E11.9  . levothyroxine (SYNTHROID, LEVOTHROID) 75 MCG tablet TAKE 75 mcg by mouth once EVERY DAY  . lisinopril (PRINIVIL,ZESTRIL) 20 MG tablet TAKE 262mby mouth once EVERY DAY  . Multiple Vitamin (MULTIVITAMIN) tablet Take 1 tablet by mouth daily.  .Marland Kitchen  nitroGLYCERIN (NITROSTAT) 0.4 MG SL tablet Place 1 tablet (0.4 mg total) under the tongue every 5 (five) minutes as needed.  Marland Kitchen omega-3 acid ethyl esters (LOVAZA) 1 g capsule Take 2 g by mouth daily.  Marland Kitchen PRESCRIPTION MEDICATION Apply 1 application topically 2 (two) times daily as needed (rash/irritation and flare ups). Prescription Ointment for psoriasis on hands.  . triamcinolone (KENALOG) 0.1 % cream Apply 1 application topically at bedtime as needed (rash/ irritation).   Marland Kitchen VITAMIN D, CHOLECALCIFEROL, PO Take 2,000 Units by mouth daily.   Marland Kitchen COLCRYS 0.6 MG tablet Take 1 tablet (0.6 mg total) by mouth 2 (two) times daily. (Patient not taking: Reported on 09/20/2017)  . ezetimibe (ZETIA) 10 MG tablet Take 1 tablet (10 mg total) by mouth daily.   No facility-administered encounter  medications on file as of 09/20/2017.     Activities of Daily Living In your present state of health, do you have any difficulty performing the following activities: 09/20/2017  Hearing? N  Vision? N  Difficulty concentrating or making decisions? N  Walking or climbing stairs? N  Dressing or bathing? N  Doing errands, shopping? N  Preparing Food and eating ? N  Using the Toilet? N  In the past six months, have you accidently leaked urine? N  Do you have problems with loss of bowel control? N  Managing your Medications? N  Managing your Finances? N  Housekeeping or managing your Housekeeping? N  Some recent data might be hidden    Patient Care Team: Jinny Sanders, MD as PCP - General (Family Medicine) Gaynelle Arabian, MD as Consulting Physician (Orthopedic Surgery) Minna Merritts, MD as Consulting Physician (Cardiology) Dasher, Rayvon Char, MD as Consulting Physician (Dermatology) Birder Robson, MD as Referring Physician (Ophthalmology)    Assessment:     Hearing Screening   _0  _1  _2  _3  _4  _5  _6  _7  _8   Right ear:   40 40 40  40    Left ear:   40 40 40  40    Vision Screening Comments: Last vision exam in April 2018   Exercise Activities and Dietary recommendations Current Exercise Habits: Home exercise routine, Type of exercise: walking, Time (Minutes): 60, Frequency (Times/Week): 3, Weekly Exercise (Minutes/Week): 180, Intensity: Mild, Exercise limited by: None identified  Goals    . Increase physical activity          Starting 09/20/2017, I will continue to walk or do housework at least 60 min 3-4 days per week.       Fall Risk Fall Risk  09/20/2017 07/30/2016  Falls in the past year? No No   Depression Screen PHQ 2/9 Scores 09/20/2017 07/30/2016  PHQ - 2 Score 0 0  PHQ- 9 Score 0 -     Cognitive Function MMSE - Mini Mental State Exam 09/20/2017 07/30/2016  Orientation to time 5 5  Orientation to Place 5 5  Registration 3 3    Attention/ Calculation 0 0  Recall 3 3  Language- name 2 objects 0 0  Language- repeat 1 1  Language- follow 3 step command 3 3  Language- read & follow direction 0 0  Write a sentence 0 0  Copy design 0 0  Total score 20 20     PLEASE NOTE: A Mini-Cog screen was completed. Maximum score is 20. A value of 0 denotes this part of Folstein MMSE was not completed or the patient failed this part of the Mini-Cog screening.   Mini-Cog Screening Orientation to  Time - Max 5 pts Orientation to Place - Max 5 pts Registration - Max 3 pts Recall - Max 3 pts Language Repeat - Max 1 pts Language Follow 3 Step Command - Max 3 pts     Immunization History  Administered Date(s) Administered  . H1N1 11/02/2008  . Influenza Split 10/22/2006, 10/28/2007, 09/26/2008, 09/13/2009, 09/12/2010, 09/18/2011, 09/27/2012  . Influenza, High Dose Seasonal PF 09/11/2016  . Influenza,inj,Quad PF,6+ Mos 09/13/2013, 09/13/2014, 09/20/2017  . Influenza-Unspecified 08/27/2014, 09/18/2015  . Pneumococcal Conjugate-13 04/05/2014  . Pneumococcal Polysaccharide-23 09/26/2008, 02/08/2017  . Tdap 09/27/2012   Screening Tests Health Maintenance  Topic Date Due  . DEXA SCAN  11/27/2017 (Originally 09/01/2009)  . HEMOGLOBIN A1C  11/16/2017  . FOOT EXAM  02/08/2018  . OPHTHALMOLOGY EXAM  04/20/2018  . MAMMOGRAM  11/30/2018  . TETANUS/TDAP  09/27/2022  . COLONOSCOPY  05/14/2027  . INFLUENZA VACCINE  Completed  . Hepatitis C Screening  Completed  . PNA vac Low Risk Adult  Completed      Plan:     I have personally reviewed and addressed the Medicare Annual Wellness questionnaire and have noted the following in the patient's chart:  A. Medical and social history B. Use of alcohol, tobacco or illicit drugs  C. Current medications and supplements D. Functional ability and status E.  Nutritional status F.  Physical activity G. Advance directives H. List of other physicians I.  Hospitalizations, surgeries, and  ER visits in previous 12 months J.  [place] to include hearing, vision, cognitive, depression L. Referrals and appointments - none  In addition, I have reviewed and discussed with patient certain preventive protocols, quality metrics, and best practice recommendations. A written personalized care plan for preventive services as well as general preventive health recommendations were provided to patient.  See attached scanned questionnaire for additional information.   Signed,   Lindell Noe, MHA, BS, LPN Health Coach

## 2017-09-20 NOTE — Patient Instructions (Signed)
Ms. Burford , Thank you for taking time to come for your Medicare Wellness Visit. I appreciate your ongoing commitment to your health goals. Please review the following plan we discussed and let me know if I can assist you in the future.   These are the goals we discussed: Goals    . Increase physical activity          Starting 09/20/2017, I will continue to walk or do housework at least 60 min 3-4 days per week.        This is a list of the screening recommended for you and due dates:  Health Maintenance  Topic Date Due  . DEXA scan (bone density measurement)  11/27/2017*  . Hemoglobin A1C  11/16/2017  . Complete foot exam   02/08/2018  . Eye exam for diabetics  04/20/2018  . Mammogram  11/30/2018  . Tetanus Vaccine  09/27/2022  . Colon Cancer Screening  05/14/2027  . Flu Shot  Completed  .  Hepatitis C: One time screening is recommended by Center for Disease Control  (CDC) for  adults born from [birth year] through 1967.   Completed  . Pneumonia vaccines  Completed  *Topic was postponed. The date shown is not the original due date.   Preventive Care for Adults  A healthy lifestyle and preventive care can promote health and wellness. Preventive health guidelines for adults include the following key practices.  . A routine yearly physical is a good way to check with your health care provider about your health and preventive screening. It is a chance to share any concerns and updates on your health and to receive a thorough exam.  . Visit your dentist for a routine exam and preventive care every 6 months. Brush your teeth twice a day and floss once a day. Good oral hygiene prevents tooth decay and gum disease.  . The frequency of eye exams is based on your age, health, family medical history, use  of contact lenses, and other factors. Follow your health care provider's ecommendations for frequency of eye exams.  . Eat a healthy diet. Foods like vegetables, fruits, whole grains, low-fat  dairy products, and lean protein foods contain the nutrients you need without too many calories. Decrease your intake of foods high in solid fats, added sugars, and salt. Eat the right amount of calories for you. Get information about a proper diet from your health care provider, if necessary.  . Regular physical exercise is one of the most important things you can do for your health. Most adults should get at least 150 minutes of moderate-intensity exercise (any activity that increases your heart rate and causes you to sweat) each week. In addition, most adults need muscle-strengthening exercises on 2 or more days a week.  Silver Sneakers may be a benefit available to you. To determine eligibility, you may visit the website: www.silversneakers.com or contact program at 504-807-2282 Mon-Fri between 8AM-8PM.   . Maintain a healthy weight. The body mass index (BMI) is a screening tool to identify possible weight problems. It provides an estimate of body fat based on height and weight. Your health care provider can find your BMI and can help you achieve or maintain a healthy weight.   For adults 20 years and older: ? A BMI below 18.5 is considered underweight. ? A BMI of 18.5 to 24.9 is normal. ? A BMI of 25 to 29.9 is considered overweight. ? A BMI of 30 and above is considered obese.   Marland Kitchen  Maintain normal blood lipids and cholesterol levels by exercising and minimizing your intake of saturated fat. Eat a balanced diet with plenty of fruit and vegetables. Blood tests for lipids and cholesterol should begin at age 66 and be repeated every 5 years. If your lipid or cholesterol levels are high, you are over 50, or you are at high risk for heart disease, you may need your cholesterol levels checked more frequently. Ongoing high lipid and cholesterol levels should be treated with medicines if diet and exercise are not working.  . If you smoke, find out from your health care provider how to quit. If you do  not use tobacco, please do not start.  . If you choose to drink alcohol, please do not consume more than 2 drinks per day. One drink is considered to be 12 ounces (355 mL) of beer, 5 ounces (148 mL) of wine, or 1.5 ounces (44 mL) of liquor.  . If you are 66-21 years old, ask your health care provider if you should take aspirin to prevent strokes.  . Use sunscreen. Apply sunscreen liberally and repeatedly throughout the day. You should seek shade when your shadow is shorter than you. Protect yourself by wearing long sleeves, pants, a wide-brimmed hat, and sunglasses year round, whenever you are outdoors.  . Once a month, do a whole body skin exam, using a mirror to look at the skin on your back. Tell your health care provider of new moles, moles that have irregular borders, moles that are larger than a pencil eraser, or moles that have changed in shape or color.

## 2017-09-22 NOTE — Progress Notes (Signed)
I reviewed health advisor's note, was available for consultation, and agree with documentation and plan.  

## 2017-09-23 ENCOUNTER — Ambulatory Visit (INDEPENDENT_AMBULATORY_CARE_PROVIDER_SITE_OTHER): Payer: Medicare Other | Admitting: Family Medicine

## 2017-09-23 ENCOUNTER — Encounter: Payer: Self-pay | Admitting: Family Medicine

## 2017-09-23 VITALS — BP 164/65 | HR 66 | Temp 97.7°F | Ht 63.0 in | Wt 148.5 lb

## 2017-09-23 DIAGNOSIS — E039 Hypothyroidism, unspecified: Secondary | ICD-10-CM

## 2017-09-23 DIAGNOSIS — E119 Type 2 diabetes mellitus without complications: Secondary | ICD-10-CM | POA: Diagnosis not present

## 2017-09-23 DIAGNOSIS — E7800 Pure hypercholesterolemia, unspecified: Secondary | ICD-10-CM

## 2017-09-23 DIAGNOSIS — E2839 Other primary ovarian failure: Secondary | ICD-10-CM

## 2017-09-23 DIAGNOSIS — J301 Allergic rhinitis due to pollen: Secondary | ICD-10-CM | POA: Diagnosis not present

## 2017-09-23 DIAGNOSIS — I10 Essential (primary) hypertension: Secondary | ICD-10-CM | POA: Diagnosis not present

## 2017-09-23 DIAGNOSIS — Z0000 Encounter for general adult medical examination without abnormal findings: Secondary | ICD-10-CM

## 2017-09-23 LAB — HM DIABETES FOOT EXAM

## 2017-09-23 NOTE — Assessment & Plan Note (Addendum)
Inadequate control today on lisinopril. Today pt states she is [place]  Lot of stress.

## 2017-09-23 NOTE — Progress Notes (Signed)
Subjective:    Patient ID: Renee [name], female    DOB: Sep 05, 1946, 71 y.o.   MRN: [id number]  HPI  The patient presents for complete physical and review of chronic health problems.   The patient saw Candis Musa, LPN for medicare wellness. Note reviewed in detail and important notes copied below. Health maintenance: Flu vaccine - administered Abnormal screenings:  None  09/23/17 Today:  Hypertension:  Poor control  On lisinopril 20 mg daily. She is dealing with dog needing surgery today, did not sleep last night. Using medication without problems or lightheadedness:  none Chest pain with exertion: none Edema:none Short of breath: none Average home BPs: Other issues: CAD  Diabetes:   Imporoving control on on med. Lab Results  Component Value Date   HGBA1C 6.7 (H) 09/20/2017  Using medications without difficulties: Hypoglycemic episodes:none Hyperglycemic episodes:none Feet problems: none Blood Sugars averaging: FB130  eye exam within last year: 03/2017  Elevated Cholesterol:  Good control on zetia, lovaza and lopid, LDL almost < 70.  Hx of CAD SE to statins. Lab Results  Component Value Date   CHOL 130 09/20/2017   HDL 41.00 09/20/2017   LDLCALC 77 09/20/2017   TRIG 62.0 09/20/2017   CHOLHDL 3 09/20/2017  Using medications without problems: Muscle aches:  Diet compliance: Exercise: Other complaints:  Hypothyroid  Lab Results  Component Value Date   TSH 2.80 09/20/2017     Blood pressure (!) 170/74, pulse 66, temperature 97.7 F (36.5 C), temperature source Oral, height 5\' 3"  (1.6 m), weight 148 lb 8 oz (67.4 kg).   Review of Systems  Constitutional: Negative for fatigue and fever.  HENT: Negative for congestion.   Eyes: Negative for pain.  Respiratory: Negative for cough and shortness of breath.   Cardiovascular: Negative for chest pain, palpitations and leg swelling.  Gastrointestinal: Negative for abdominal pain.  Genitourinary: Negative  for dysuria and vaginal bleeding.  Musculoskeletal: Negative for back pain.  Neurological: Negative for syncope, light-headedness and headaches.  Psychiatric/Behavioral: Negative for dysphoric mood.       Objective:   Physical Exam  Constitutional: Vital signs are normal. She appears well-developed and well-nourished. She is cooperative.  Non-toxic appearance. She does not appear ill. No distress.  HENT:  Head: Normocephalic.  Right Ear: Hearing, tympanic membrane, external ear and ear canal normal.  Left Ear: Hearing, tympanic membrane, external ear and ear canal normal.  Nose: Nose normal.  Eyes: Pupils are equal, round, and reactive to light. EOM and lids are normal. Lids are everted and swept, no foreign bodies found. Left conjunctiva is injected.  Left eye red , occ discharge for 2 years.. Has tried allergy meds..seen ENT., opthalmologic.Marland Kitchen Having allergy test today  Neck: Trachea normal and normal range of motion. Neck supple. Carotid bruit is not present. No thyroid mass and no thyromegaly present.  Cardiovascular: Normal rate, regular rhythm, S1 normal, S2 normal, normal heart sounds and intact distal pulses.  Exam reveals no gallop.   No murmur heard. Pulmonary/Chest: Effort normal and breath sounds normal. No respiratory distress. She has no wheezes. She has no rhonchi. She has no rales.  Abdominal: Soft. Normal appearance and bowel sounds are normal. She exhibits no distension, no fluid wave, no abdominal bruit and no mass. There is no hepatosplenomegaly. There is no tenderness. There is no rebound, no guarding and no CVA tenderness. No hernia.  Lymphadenopathy:    She has no cervical adenopathy.    She has no axillary adenopathy.  Neurological: She is alert. She has normal strength. No cranial nerve deficit or sensory deficit.  Skin: Skin is warm, dry and intact. No rash noted.  dry flaky skin at hands and finger  Psychiatric: Her speech is normal and behavior is normal.  Judgment normal. Her mood appears not anxious. Cognition and memory are normal. She does not exhibit a depressed mood.      Diabetic foot exam: Normal inspection No skin breakdown Calluses on left lateral foot Normal DP pulses Normal sensation to light touch and monofilament Nails normal     Assessment & Plan:  The patient's preventative maintenance and recommended screening tests for an annual wellness exam were reviewed in full today. Brought up to date unless services declined.  Counselled on the importance of diet, exercise, and its role in overall health and mortality. The patient's FH and SH was reviewed, including their home life, tobacco status, and drug and alcohol status.   Vaccines:  Flu already given. Shingles - postponed PNA vaccines - uptodate Foot exam - DONE today Colon cancer screening - colonoscopy 04/2017, plan repeat in 5 years   Mammo: due 11/2017 Bone density - due 11/2017 Hep C screening - done  nonsmoker

## 2017-09-23 NOTE — Assessment & Plan Note (Signed)
Well controlled. Continue current medication.  

## 2017-09-23 NOTE — Patient Instructions (Addendum)
Follow BP at home.. Call if remaining high.  Keep wokring on regular exercise and low carb low fat low cholesterol diet.  Please stop at the front desk to set up referral.

## 2017-09-23 NOTE — Assessment & Plan Note (Signed)
Good control with diet. Encouraged exercise, weight loss, healthy eating habits.  

## 2017-09-27 DIAGNOSIS — J329 Chronic sinusitis, unspecified: Secondary | ICD-10-CM | POA: Diagnosis not present

## 2017-09-27 DIAGNOSIS — J301 Allergic rhinitis due to pollen: Secondary | ICD-10-CM | POA: Diagnosis not present

## 2017-09-27 DIAGNOSIS — J342 Deviated nasal septum: Secondary | ICD-10-CM | POA: Diagnosis not present

## 2017-11-30 NOTE — Progress Notes (Signed)
Cardiology Office Note  Date:  12/01/2017   ID:  Renee, [name] 1946-10-05, MRN [id number]  PCP:  Renee Sanders, MD   Chief Complaint  Patient presents with  . Other    12 month follow up. Patient denies chest pain and SOB.  Meds reviewed verbally with patient.     HPI:  Renee [name] is a very pleasant 71 year old woman with a history of  hypertension,  hyperlipidemia,  long history of diabetes.  Cardiac catheterization October 2011 for chest pain showed mild to moderate non-obstructive CAD. 2010  With  EF of 55% to 60%.   40-50% LAD disease, 60% ostial diagonal disease, 40% PDA History of 2 knee replacements  in [place] She presents today for follow-up of her coronary artery disease  reports that she is doing well Denies any chest pain concerning for angina Active at baseline, regular walking Knees are working well after knee replacement  Does not check her blood pressure at home Daughter is a Marine scientist Very happy with her cholesterol medication, Zetia Seems to be keeping her numbers down  Lab work reviewed with her showing  hemoglobin A1c 6.7 Total cholesterol 130,   Previously unable to tolerate statins,  Trying to control diabetes through diet alone  EKG on today's visit shows normal sinus rhythm with rate 74  Bpm, no significant ST or T-wave changes  Previous significant reaction to statins, simvastatin may have caused a rash.      PMH:   has a past medical history of Allergy, Arthritis, CAD (coronary artery disease), Diabetes mellitus, History of chicken pox, History of phobia, Hyperlipidemia, Hypertension, Hypothyroidism, Kidney stones, Psoriasis, and Urinary tract infection.  PSH:    Past Surgical History:  Procedure Laterality Date  . APPENDECTOMY    . CARDIAC CATHETERIZATION     2011  . CARPAL TUNNEL RELEASE     Left trigger finger  . COLONOSCOPY WITH PROPOFOL N/A 05/13/2017   Procedure: COLONOSCOPY WITH PROPOFOL;  Surgeon: Renee Silvas, MD;   Location: [facility] ENDOSCOPY;  Service: Endoscopy;  Laterality: N/A;  . EYE SURGERY     lasik - right eye   . TOTAL KNEE ARTHROPLASTY Bilateral 06/12/2015   Procedure: TOTAL KNEE BILATERAL;  Surgeon: Renee Arabian, MD;  Location: [facility] ORS;  Service: Orthopedics;  Laterality: Bilateral;  and epidural  . TUBAL LIGATION    . Tubes tied     and untied. and tied again    Current Outpatient Medications  Medication Sig Dispense Refill  . acetaminophen (TYLENOL) 500 MG tablet Take 1,000 mg by mouth every 6 (six) hours as needed for moderate pain or headache.    . augmented betamethasone dipropionate (DIPROLENE-AF) 0.05 % ointment APPLY TO AFFECTED AREAS ON HANDS TWICE DAILY UNTIL CLEAR  0  . Blood Glucose Monitoring Suppl (ACCU-CHEK COMPACT CARE KIT) KIT Meter Kit 1 each 0  . diphenhydrAMINE (BENADRYL) 25 MG tablet Take 1 tablet (25 mg total) by mouth at bedtime as needed for sleep. (Patient taking differently: Take 25 mg by mouth at bedtime as needed for allergies. ) 30 tablet 0  . diphenhydramine-acetaminophen (TYLENOL PM) 25-500 MG TABS tablet Take 1 tablet by mouth at bedtime as needed (sleep/pain).    . Emollient (CERAVE) CREA Apply 1 application topically daily as needed (psoriasis).     . ezetimibe (ZETIA) 10 MG tablet Take 1 tablet (10 mg total) by mouth daily. 90 tablet 4  . gemfibrozil (LOPID) 600 MG tablet Take 1 tablet (600 mg total) by  mouth every morning. 90 tablet 3  . glucose blood (ACCU-CHEK COMPACT PLUS) test strip Use to check sugar once time daily.  Dx: E11.9 100 each 3  . levothyroxine (SYNTHROID, LEVOTHROID) 75 MCG tablet TAKE 75 mcg by mouth once EVERY DAY 90 tablet 3  . lisinopril (PRINIVIL,ZESTRIL) 20 MG tablet TAKE '20mg'$  by mouth once EVERY DAY 90 tablet 3  . Multiple Vitamin (MULTIVITAMIN) tablet Take 1 tablet by mouth daily.    . nitroGLYCERIN (NITROSTAT) 0.4 MG SL tablet Place 1 tablet (0.4 mg total) under the tongue every 5 (five) minutes as needed. 30 tablet 6  . VITAMIN D,  CHOLECALCIFEROL, PO Take 2,000 Units by mouth daily.      No current facility-administered medications for this visit.      Allergies:   Other; Codeine; Metformin; Robaxin [methocarbamol]; and Simvastatin   Social History:  The patient  reports that  has never smoked. she has never used smokeless tobacco. She reports that she does not drink alcohol or use drugs.   Family History:   family history includes Arthritis in her brother and brother; COPD in her mother; Diabetes in her brother and sister; Hypertension in her sister.    Review of Systems: Review of Systems  Constitutional: Negative.   Respiratory: Negative.   Cardiovascular: Negative.   Gastrointestinal: Negative.   Musculoskeletal: Negative.   Neurological: Negative.   Psychiatric/Behavioral: Negative.   All other systems reviewed and are negative.    PHYSICAL EXAM: VS:  BP (!) 155/70 (BP Location: Left Arm, Patient Position: Sitting, Cuff Size: Normal)   Pulse 93   Ht '5\' 4"'$  (1.626 m)   Wt 150 lb 12 oz (68.4 kg)   BMI 25.88 kg/m  , BMI Body mass index is 25.88 kg/m. GEN: Well nourished, well developed, in no acute distress  HEENT: normal  Neck: no JVD, carotid bruits, or masses Cardiac: RRR; no murmurs, rubs, or gallops,no edema  Respiratory:  clear to auscultation bilaterally, normal work of breathing GI: soft, nontender, nondistended, + BS MS: no deformity or atrophy  Skin: warm and dry, no rash Neuro:  Strength and sensation are intact Psych: euthymic mood, full affect    Recent Labs: 09/20/2017: ALT 11; BUN 19; Creatinine, Ser 0.69; Potassium 4.2; Sodium 141; TSH 2.80    Lipid Panel Lab Results  Component Value Date   CHOL 130 09/20/2017   HDL 41.00 09/20/2017   LDLCALC 77 09/20/2017   TRIG 62.0 09/20/2017      Wt Readings from Last 3 Encounters:  12/01/17 150 lb 12 oz (68.4 kg)  09/23/17 148 lb 8 oz (67.4 kg)  09/20/17 149 lb 8 oz (67.8 kg)       ASSESSMENT AND PLAN:  Essential  hypertension - Plan: EKG 12-Lead Initial blood pressure elevated, mildly improved on my recheck She reports typically not a problem at home, recommended she check it at home Have her daughter help her  Atherosclerosis of native coronary artery of native heart without angina pectoris - Plan: EKG 12-Lead Currently with no symptoms of angina. No further workup at this time. Continue current medication regimen.  Cholesterol at goal  Hypercholesteremia Numbers much improved, no medication changes made Statin intolerance  Type 2 diabetes mellitus with complication, unspecified long term insulin use status (HCC) Trying to control this with diet Hemoglobin A1c down to 6.7, recommended continued dietary modification   Total encounter time more than 25 minutes  Greater than 50% was spent in counseling and coordination of care with  the patient   Disposition:   F/U  12 months   Orders Placed This Encounter  Procedures  . EKG 12-Lead     Signed, Esmond Plants, M.D., Ph.D. 12/01/2017  [place], [place]

## 2017-12-01 ENCOUNTER — Ambulatory Visit (INDEPENDENT_AMBULATORY_CARE_PROVIDER_SITE_OTHER): Payer: Medicare Other | Admitting: Cardiovascular Disease

## 2017-12-01 ENCOUNTER — Encounter: Payer: Self-pay | Admitting: Cardiovascular Disease

## 2017-12-01 VITALS — BP 155/70 | HR 93 | Ht 64.0 in | Wt 150.8 lb

## 2017-12-01 DIAGNOSIS — I209 Angina pectoris, unspecified: Secondary | ICD-10-CM | POA: Diagnosis not present

## 2017-12-01 DIAGNOSIS — E7800 Pure hypercholesterolemia, unspecified: Secondary | ICD-10-CM | POA: Diagnosis not present

## 2017-12-01 DIAGNOSIS — I10 Essential (primary) hypertension: Secondary | ICD-10-CM | POA: Diagnosis not present

## 2017-12-01 DIAGNOSIS — I25118 Atherosclerotic heart disease of native coronary artery with other forms of angina pectoris: Secondary | ICD-10-CM | POA: Diagnosis not present

## 2017-12-01 DIAGNOSIS — E119 Type 2 diabetes mellitus without complications: Secondary | ICD-10-CM | POA: Diagnosis not present

## 2017-12-01 MED ORDER — EZETIMIBE 10 MG PO TABS: 10.0000 mg | ORAL_TABLET | Freq: Every day | ORAL | 4 refills | Status: DC

## 2017-12-01 NOTE — Patient Instructions (Signed)

## 2017-12-02 DIAGNOSIS — E2839 Other primary ovarian failure: Secondary | ICD-10-CM | POA: Diagnosis not present

## 2017-12-02 DIAGNOSIS — Z1231 Encounter for screening mammogram for malignant neoplasm of breast: Secondary | ICD-10-CM | POA: Diagnosis not present

## 2017-12-02 DIAGNOSIS — Z780 Asymptomatic menopausal state: Secondary | ICD-10-CM | POA: Diagnosis not present

## 2017-12-02 DIAGNOSIS — M8588 Other specified disorders of bone density and structure, other site: Secondary | ICD-10-CM | POA: Diagnosis not present

## 2017-12-02 LAB — HM DEXA SCAN: HM DEXA SCAN: NORMAL

## 2017-12-08 ENCOUNTER — Encounter: Payer: Self-pay | Admitting: Family Medicine

## 2017-12-08 DIAGNOSIS — M8580 Other specified disorders of bone density and structure, unspecified site: Secondary | ICD-10-CM | POA: Insufficient documentation

## 2017-12-13 ENCOUNTER — Telehealth: Payer: Self-pay | Admitting: *Deleted

## 2017-12-13 NOTE — Telephone Encounter (Signed)
Left message for Ms. 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  Recommended calcium, Vit D and weight bearing exercises.  Will plan on rechecking in 2 to 5 years per Dr. Diona Browner.

## 2018-01-16 ENCOUNTER — Ambulatory Visit: Payer: Self-pay | Admitting: *Deleted

## 2018-01-16 NOTE — Telephone Encounter (Signed)
Renee [name] at [facility] conferenced pts daughter Renee [name] who is a Marine scientist; on 09/14/18 pt had a 5 min episode where she stared into space; had lt sided h/a. Today pt has no symptoms, no h/a no slurred speech, no weakness in arms or legs and is aware of where she is and who she is and does not want to go to ED or UC for eval. Renee [name] scheduled 30' appt on 01/17/18 at 11:45 with Dr Diona Browner and if pt has any change in condition prior to appt pt will got to ED. FYI to Dr Diona Browner.

## 2018-01-16 NOTE — Telephone Encounter (Signed)
Pt  Daughter  Was  Advised  - that  Her  Mother   Needed  To  Be    Seen  Tonight      For  Neuro  Workup     pts  Daughter  Stated pt  Would  Not  Go  To  [place]   She  Wanted  Her to  Be  Seen by  Her  pcp   Rena  At  [facility]  [place]  With  Daughter   appt  Made  With  Dr  Diona Browner  For  Tomorrow  Am     DAUGHTER  ADVISED  IF  HER  MOTHER  DISPLAYED  [place]  TO  CALL  911   AND  GO TO  [place]     Reason for Disposition . [1] Loss of speech or garbled speech AND [2] sudden onset AND [3] brief (now gone)  Answer Assessment - Initial Assessment Questions 1. SYMPTOM: "What is the main symptom you are concerned about?" (e.g., weakness, numbness)     TWO  DAYS  SHE  HAD   PERIOD   OF  ABOUT  5   MINUTES   WHEN  SHE  SHE  STARTED  INTO  SPACE  THIS   IS  WHAT  SHE  REPORTED  TO  THE  DAUGHTER    SHE  REPORTS  SOME  NUMBNESS  R  HAND  FROM  HAND   AND HEADACHE  L  SIDE  HEAD  LASTED  ABOUT  1  HOUR      2. ONSET: "When did this start?" (minutes, hours, days; while sleeping)     HAPPENED  2  DAYS  AGO    SHE IS  NORMAL  NOW      3. LAST NORMAL: "When was the last time you were normal (no symptoms)?"     SHE  IS  NORMAL  NOW    4. PATTERN "Does this come and go, or has it been constant since it started?"  "Is it present now?"       NOT  PRESENT  NOW    5. CARDIAC SYMPTOMS: "Have you had any of the following symptoms: chest pain, difficulty breathing, palpitations?"        NONE    6. NEUROLOGIC SYMPTOMS: "Have you had any of the following symptoms: headache, dizziness, vision loss, double vision, changes in speech, unsteady on your feet?"     OTHER  THAN THE  HEADACHE    AND  NUMBNESS      SEV  DAYS  AGO    7. OTHER SYMPTOMS: "Do you have any other symptoms?"     WEAKNESS    GENERALIZED      8. PREGNANCY: "Is there any chance you are pregnant?" "When was your last menstrual period?"     N/A  Protocols used: NEUROLOGIC DEFICIT-A-AH

## 2018-01-17 ENCOUNTER — Other Ambulatory Visit: Payer: Self-pay

## 2018-01-17 ENCOUNTER — Encounter: Payer: Self-pay | Admitting: Family Medicine

## 2018-01-17 ENCOUNTER — Ambulatory Visit (INDEPENDENT_AMBULATORY_CARE_PROVIDER_SITE_OTHER): Payer: Medicare Other | Admitting: Family Medicine

## 2018-01-17 VITALS — BP 160/80 | HR 71 | Temp 97.6°F | Ht 63.0 in | Wt 148.5 lb

## 2018-01-17 DIAGNOSIS — H538 Other visual disturbances: Secondary | ICD-10-CM

## 2018-01-17 DIAGNOSIS — I10 Essential (primary) hypertension: Secondary | ICD-10-CM | POA: Diagnosis not present

## 2018-01-17 DIAGNOSIS — R4701 Aphasia: Secondary | ICD-10-CM | POA: Insufficient documentation

## 2018-01-17 NOTE — Progress Notes (Signed)
Subjective:    Patient ID: Renee [name], female    DOB: [birth year], 72 y.o.   MRN: [id number]  HPI    72 year old female with HTN, high cholesterol, CAD, DM presents following a sudden onset episode on 1/19 of blurred vision, staring spell,  trouble processing thought and funny feeling in finger on her right hand.   She was talking on phone with cousin.. She could not find the right words.  No slurred speech.  Only slight headache.  Unusual feeling in  Right fingers.. Felt like she could not move them, no numbness.  Symptoms lasted  15 -20 minutes. She feels good now except kind of tired.  Prior to symptoms she had been feeling well, eating well.  Sugar during the event 130  Blood pressure at home later that day 200/100 ?,  And 150/103 She has been taking linsinopril  As she is supposed to..   BP Readings from Last 3 Encounters:  01/17/18 (!) 160/80  12/01/17 (!) 155/70  09/23/17 (!) 164/65      She has history of visual migraines.. None recently.   Had  stable EKG in Dec 2018  She is no 81 mg daily ASA.  Blood pressure (!) 160/80, pulse 71, temperature 97.6 F (36.4 C), temperature source Oral, height 5\' 3"  (1.6 m), weight 148 lb 8 oz (67.4 kg). Social History /Family History/Past Medical History reviewed in detail and updated in EMR if needed.  Review of Systems  Constitutional: Negative for fatigue and fever.  HENT: Negative for ear pain.   Eyes: Negative for pain.  Respiratory: Negative for chest tightness and shortness of breath.   Cardiovascular: Negative for chest pain, palpitations and leg swelling.  Gastrointestinal: Negative for abdominal pain.  Genitourinary: Negative for dysuria.       Objective:   Physical Exam  Constitutional: She is oriented to person, place, and time. Vital signs are normal. She appears well-developed and well-nourished. She is cooperative.  Non-toxic appearance. She does not appear ill. No distress.  HENT:  Head: Normocephalic.    Right Ear: Hearing, tympanic membrane, external ear and ear canal normal. Tympanic membrane is not erythematous, not retracted and not bulging.  Left Ear: Hearing, tympanic membrane, external ear and ear canal normal. Tympanic membrane is not erythematous, not retracted and not bulging.  Nose: No mucosal edema or rhinorrhea. Right sinus exhibits no maxillary sinus tenderness and no frontal sinus tenderness. Left sinus exhibits no maxillary sinus tenderness and no frontal sinus tenderness.  Mouth/Throat: Uvula is midline, oropharynx is clear and moist and mucous membranes are normal.  Eyes: Conjunctivae, EOM and lids are normal. Pupils are equal, round, and reactive to light. Lids are everted and swept, no foreign bodies found.  Neck: Trachea normal and normal range of motion. Neck supple. Carotid bruit is not present. No thyroid mass and no thyromegaly present.  Cardiovascular: Normal rate, regular rhythm, S1 normal, S2 normal, normal heart sounds, intact distal pulses and normal pulses. Exam reveals no gallop and no friction rub.  No murmur heard. Pulmonary/Chest: Effort normal and breath sounds normal. No tachypnea. No respiratory distress. She has no decreased breath sounds. She has no wheezes. She has no rhonchi. She has no rales.  Abdominal: Soft. Normal appearance and bowel sounds are normal. There is no tenderness.  Neurological: She is alert and oriented to person, place, and time. She has normal strength and normal reflexes. No cranial nerve deficit or sensory deficit. She exhibits normal muscle tone.  She displays a negative Romberg sign. Coordination and gait normal. GCS eye subscore is 4. GCS verbal subscore is 5. GCS motor subscore is 6.  Nml cerebellar exam   No papilledema  Skin: Skin is warm, dry and intact. No rash noted.  Psychiatric: She has a normal mood and affect. Her speech is normal and behavior is normal. Judgment and thought content normal. Her mood appears not anxious.  Cognition and memory are normal. Cognition and memory are not impaired. She does not exhibit a depressed mood. She exhibits normal recent memory and normal remote memory.          Assessment & Plan:

## 2018-01-17 NOTE — Assessment & Plan Note (Signed)
Short-term spell of aphasia? Secondary to TIA? Or migraine.

## 2018-01-17 NOTE — Patient Instructions (Addendum)
Increase aspirin to 325 mg daily, enteric coated.  Increase lisinopril to 40 mg daily. You can take 2 tabs of 20 mg daily.  Follow BP at home.  Call if you change you mind about MRI of brain or ultrasound of carotids and heart.. Let me know.  If you have a recurrent episode call ASAP or go to ER.

## 2018-01-17 NOTE — Assessment & Plan Note (Signed)
Episode of blured vison and apraxia...  Ddx: TIA, migraine and seizure. Offered further eval with MRi, carotid dopplers and ECHo.. Pt would like to hold off at this time. Can discuss neuro referral if recurrent issues for EEG. Not interested now.  Will have her increase ASA to 325 mg daily for CVA prevention.  She will return for labs eval as already scheduled.  We will treat BP and follow up in 2 weeks.

## 2018-01-24 DIAGNOSIS — H10502 Unspecified blepharoconjunctivitis, left eye: Secondary | ICD-10-CM | POA: Diagnosis not present

## 2018-01-31 ENCOUNTER — Other Ambulatory Visit: Payer: Self-pay | Admitting: *Deleted

## 2018-01-31 ENCOUNTER — Telehealth: Payer: Self-pay | Admitting: Family Medicine

## 2018-01-31 ENCOUNTER — Ambulatory Visit (INDEPENDENT_AMBULATORY_CARE_PROVIDER_SITE_OTHER): Payer: Medicare Other | Admitting: Family Medicine

## 2018-01-31 ENCOUNTER — Encounter: Payer: Self-pay | Admitting: Family Medicine

## 2018-01-31 VITALS — BP 148/70 | HR 76 | Temp 97.9°F | Ht 63.0 in | Wt 149.0 lb

## 2018-01-31 DIAGNOSIS — H538 Other visual disturbances: Secondary | ICD-10-CM | POA: Diagnosis not present

## 2018-01-31 DIAGNOSIS — E7800 Pure hypercholesterolemia, unspecified: Secondary | ICD-10-CM

## 2018-01-31 DIAGNOSIS — E559 Vitamin D deficiency, unspecified: Secondary | ICD-10-CM

## 2018-01-31 DIAGNOSIS — F411 Generalized anxiety disorder: Secondary | ICD-10-CM | POA: Insufficient documentation

## 2018-01-31 DIAGNOSIS — R4701 Aphasia: Secondary | ICD-10-CM | POA: Diagnosis not present

## 2018-01-31 DIAGNOSIS — E119 Type 2 diabetes mellitus without complications: Secondary | ICD-10-CM | POA: Diagnosis not present

## 2018-01-31 DIAGNOSIS — I10 Essential (primary) hypertension: Secondary | ICD-10-CM

## 2018-01-31 DIAGNOSIS — M8580 Other specified disorders of bone density and structure, unspecified site: Secondary | ICD-10-CM

## 2018-01-31 DIAGNOSIS — E039 Hypothyroidism, unspecified: Secondary | ICD-10-CM | POA: Diagnosis not present

## 2018-01-31 LAB — LIPID PANEL
CHOLESTEROL: 152 mg/dL (ref 0–200)
HDL: 43.9 mg/dL (ref >39.00)
LDL CALC: 94 mg/dL (ref 0–99)
NonHDL: 107.87
Total CHOL/HDL Ratio: 3
Triglycerides: 68 mg/dL (ref 0.0–149.0)
VLDL: 13.6 mg/dL (ref 0.0–40.0)

## 2018-01-31 LAB — CBC WITH DIFFERENTIAL/PLATELET
Basophils Absolute: 0 10*3/uL (ref 0.0–0.1)
Basophils Relative: 0.9 % (ref 0.0–3.0)
EOS ABS: 0.1 10*3/uL (ref 0.0–0.7)
Eosinophils Relative: 2.5 % (ref 0.0–5.0)
HCT: 39.7 % (ref 36.0–46.0)
HEMOGLOBIN: 13.2 g/dL (ref 12.0–15.0)
Lymphocytes Relative: 34.3 % (ref 12.0–46.0)
Lymphs Abs: 2 10*3/uL (ref 0.7–4.0)
MCHC: 33.2 g/dL (ref 30.0–36.0)
MCV: 92 fl (ref 78.0–100.0)
MONO ABS: 0.7 10*3/uL (ref 0.1–1.0)
Monocytes Relative: 11.6 % (ref 3.0–12.0)
Neutro Abs: 2.9 10*3/uL (ref 1.4–7.7)
Neutrophils Relative %: 50.7 % (ref 43.0–77.0)
Platelets: 353 10*3/uL (ref 150.0–400.0)
RBC: 4.32 Mil/uL (ref 3.87–5.11)
RDW: 13.1 % (ref 11.5–15.5)
WBC: 5.8 10*3/uL (ref 4.0–10.5)

## 2018-01-31 LAB — VITAMIN D 25 HYDROXY (VIT D DEFICIENCY, FRACTURES): VITD: 36.61 ng/mL (ref 30.00–100.00)

## 2018-01-31 LAB — COMPREHENSIVE METABOLIC PANEL
ALBUMIN: 4.7 g/dL (ref 3.5–5.2)
ALK PHOS: 62 U/L (ref 39–117)
ALT: 13 U/L (ref 0–35)
AST: 17 U/L (ref 0–37)
BILIRUBIN TOTAL: 0.4 mg/dL (ref 0.2–1.2)
BUN: 23 mg/dL (ref 6–23)
CO2: 30 mEq/L (ref 19–32)
CREATININE: 0.68 mg/dL (ref 0.40–1.20)
Calcium: 9.8 mg/dL (ref 8.4–10.5)
Chloride: 102 mEq/L (ref 96–112)
GFR: 90.04 mL/min (ref >60.00)
Glucose, Bld: 124 mg/dL — ABNORMAL HIGH (ref 70–99)
Potassium: 3.9 mEq/L (ref 3.5–5.1)
SODIUM: 140 meq/L (ref 135–145)
TOTAL PROTEIN: 8.1 g/dL (ref 6.0–8.3)

## 2018-01-31 LAB — HEMOGLOBIN A1C: Hgb A1c MFr Bld: 7.3 % — ABNORMAL HIGH (ref 4.6–6.5)

## 2018-01-31 MED ORDER — LISINOPRIL 40 MG PO TABS: 40.0000 mg | ORAL_TABLET | Freq: Every day | ORAL | 3 refills | Status: DC

## 2018-01-31 MED ORDER — SERTRALINE HCL 25 MG PO TABS: 25.0000 mg | ORAL_TABLET | Freq: Every day | ORAL | 3 refills | Status: DC

## 2018-01-31 MED ORDER — GEMFIBROZIL 600 MG PO TABS: 600.0000 mg | ORAL_TABLET | Freq: Every morning | ORAL | 3 refills | Status: DC

## 2018-01-31 MED ORDER — EZETIMIBE 10 MG PO TABS: 10.0000 mg | ORAL_TABLET | Freq: Every day | ORAL | 1 refills | Status: DC

## 2018-01-31 MED ORDER — LEVOTHYROXINE SODIUM 75 MCG PO TABS: ORAL_TABLET | ORAL | 3 refills | Status: DC

## 2018-01-31 NOTE — Progress Notes (Signed)
   Subjective:    Patient ID: Renee [name], female    DOB: 1946/01/09, 72 y.o.   MRN: [id number]  HPI    72 year old female presents for follow up HTN.  At last OV on  1/22 she reported  15-20 min episode of blurred vision, staring spell, difficulty finding words. Along with headache. BP Readings from Last 3 Encounters:  01/31/18 (!) 148/70  01/17/18 (!) 160/80  12/01/17 (!) 155/70   Given concern for TIA vs migraine versus seizure... Offered TIA work up with MRI, carotid dopplers, ECHo.. Pt was not interested at that time.Increased ASA to 325 mg daily. Lisinopril was increased to 40 mg daily.  Today she reports improvement in BP.  BPs 119-136/69-73 in AMs .Marland Kitchen One nighttime measurement 177/82  She is using a wrist cuff and sometime daughter checks with arm manually.   She feels anxiety makes it higher.  She denies any further episodes or blurred vision, headache   Daughter feels she is very anxious.. Thinks she needs to be treated. Anxiety especially when going places.   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 Issues ongoing for years, worse in last year.  Eye issues stressing her out. She is worried about getting older.    Review of Systems  Constitutional: Negative for fatigue and fever.  HENT: Negative for ear pain.   Eyes: Positive for discharge and redness. Negative for pain.       Treated for eye infection.  Respiratory: Negative for chest tightness and shortness of breath.   Cardiovascular: Negative for chest pain, palpitations and leg swelling.  Gastrointestinal: Negative for abdominal pain.  Genitourinary: Negative for dysuria.       Objective:   Physical Exam  Constitutional: Vital signs are normal. She appears well-developed and well-nourished. She is cooperative.  Non-toxic appearance. She does not appear ill. No distress.  HENT:  Head: Normocephalic.  Right Ear: Hearing, tympanic membrane, external ear and ear canal normal. Tympanic membrane is not erythematous, not retracted and not  bulging.  Left Ear: Hearing, tympanic membrane, external ear and ear canal normal. Tympanic membrane is not erythematous, not retracted and not bulging.  Nose: No mucosal edema or rhinorrhea. Right sinus exhibits no maxillary sinus tenderness and no frontal sinus tenderness. Left sinus exhibits no maxillary sinus tenderness and no frontal sinus tenderness.  Mouth/Throat: Uvula is midline, oropharynx is clear and moist and mucous membranes are normal.  Eyes: Conjunctivae, EOM and lids are normal. Pupils are equal, round, and reactive to light. Lids are everted and swept, no foreign bodies found.  Neck: Trachea normal and normal range of motion. Neck supple. Carotid bruit is not present. No thyroid mass and no thyromegaly present.  Cardiovascular: Normal rate, regular rhythm, S1 normal, S2 normal, normal heart sounds, intact distal pulses and normal pulses. Exam reveals no gallop and no friction rub.  No murmur heard. Pulmonary/Chest: Effort normal and breath sounds normal. No tachypnea. No respiratory distress. She has no decreased breath sounds. She has no wheezes. She has no rhonchi. She has no rales.  Abdominal: Soft. Normal appearance and bowel sounds are normal. There is no tenderness.  Neurological: She is alert.  Skin: Skin is warm, dry and intact. No rash noted.  Psychiatric: Her speech is normal and behavior is normal. Judgment and thought content normal. Her mood appears not anxious. Cognition and memory are normal. She does not exhibit a depressed mood.          Assessment & Plan:

## 2018-01-31 NOTE — Telephone Encounter (Signed)
New rx sent to pharmacy

## 2018-01-31 NOTE — Assessment & Plan Note (Signed)
Due for re-eval. 

## 2018-01-31 NOTE — Assessment & Plan Note (Signed)
No further episodes once BP lower.. May be related to migraine/anxiety etc.. Continue asa 325mg .

## 2018-01-31 NOTE — Assessment & Plan Note (Signed)
Improved control. Some elevation may be diue tpo anxiety. Wil treat. Work on heart healthy diet and low salt.

## 2018-01-31 NOTE — Patient Instructions (Addendum)
Use arm blood pressure cuff .Marland Kitchen Do not use wrist cuff.  Conitnue the current dose of lisinopril 40 mg daily  Start sertraline at bedtime for anxiety.  Please stop at the lab to have labs drawn.  Cancel lab appt prior to physical.

## 2018-01-31 NOTE — Telephone Encounter (Signed)
Copied from [place]. Topic: Quick Communication - See Telephone Encounter >> Jan 31, 2018  3:29 PM Bea Graff, NT wrote: CRM for notification. See Telephone encounter for: Pt calling and states she did not want the Zoloft sent to the mail order service. She wanted it sent to [facility] in [place]. Please call once fixed  01/31/18.

## 2018-02-06 DIAGNOSIS — H10502 Unspecified blepharoconjunctivitis, left eye: Secondary | ICD-10-CM | POA: Diagnosis not present

## 2018-03-06 ENCOUNTER — Ambulatory Visit: Payer: Medicare Other | Admitting: Certified Nurse Midwife

## 2018-03-20 ENCOUNTER — Other Ambulatory Visit: Payer: Medicare Other

## 2018-03-24 ENCOUNTER — Ambulatory Visit (INDEPENDENT_AMBULATORY_CARE_PROVIDER_SITE_OTHER): Payer: Medicare Other | Admitting: Family Medicine

## 2018-03-24 ENCOUNTER — Encounter: Payer: Self-pay | Admitting: Family Medicine

## 2018-03-24 ENCOUNTER — Other Ambulatory Visit: Payer: Self-pay

## 2018-03-24 VITALS — BP 140/60 | HR 92 | Temp 98.2°F | Ht 63.0 in | Wt 148.2 lb

## 2018-03-24 DIAGNOSIS — F411 Generalized anxiety disorder: Secondary | ICD-10-CM | POA: Diagnosis not present

## 2018-03-24 DIAGNOSIS — E119 Type 2 diabetes mellitus without complications: Secondary | ICD-10-CM

## 2018-03-24 DIAGNOSIS — I10 Essential (primary) hypertension: Secondary | ICD-10-CM | POA: Diagnosis not present

## 2018-03-24 LAB — HM DIABETES FOOT EXAM

## 2018-03-24 MED ORDER — SERTRALINE HCL 25 MG PO TABS: 25.0000 mg | ORAL_TABLET | Freq: Every day | ORAL | 1 refills | Status: DC

## 2018-03-24 NOTE — Progress Notes (Signed)
Subjective:    Patient ID: Renee [name], female    DOB: 11/08/1946, 72 y.o.   MRN: [id number]  HPI   72 year female pt presents for follow up DM.   No further episodes of aphasia, blurred vision etc.   GAD: she reports she feels less overwhelmed, less rushed. Less anxiety.  Daughter states she is less worried.  GAD went from 17 down to 1!  Diabetes:   She has not been eating as well as usual.. On no  Med. Lab Results  Component Value Date   HGBA1C 7.3 (H) 01/31/2018  Using medications without difficulties: Hypoglycemic episodes:? Hyperglycemic episodes:? Feet problems:none Blood Sugars averaging: not checking eye exam within last year: due.   Wt Readings from Last 3 Encounters:  03/24/18 148 lb 4 oz (67.2 kg)  01/31/18 149 lb (67.6 kg)  01/17/18 148 lb 8 oz (67.4 kg)    Hypertension:   Improved control on lisinopril 40 mg daily  Using medication without problems or lightheadedness:  Chest pain with exertion: Edema: Short of breath: Average home BPs: Other issues:   Blood pressure 140/60, pulse 92, temperature 98.2 F (36.8 C), temperature source Oral, height 5\' 3"  (1.6 m), weight 148 lb 4 oz (67.2 kg).  Review of Systems  Constitutional: Negative for fatigue and fever.  HENT: Negative for congestion.   Eyes: Negative for pain.  Respiratory: Negative for cough and shortness of breath.   Cardiovascular: Negative for chest pain, palpitations and leg swelling.  Gastrointestinal: Negative for abdominal pain.  Genitourinary: Negative for dysuria and vaginal bleeding.  Musculoskeletal: Negative for back pain.  Neurological: Negative for syncope, light-headedness and headaches.  Psychiatric/Behavioral: Negative for dysphoric mood.       Objective:   Physical Exam  Constitutional: Vital signs are normal. She appears well-developed and well-nourished. She is cooperative.  Non-toxic appearance. She does not appear ill. No distress.  HENT:  Head: Normocephalic.    Right Ear: Hearing, tympanic membrane, external ear and ear canal normal. Tympanic membrane is not erythematous, not retracted and not bulging.  Left Ear: Hearing, tympanic membrane, external ear and ear canal normal. Tympanic membrane is not erythematous, not retracted and not bulging.  Nose: No mucosal edema or rhinorrhea. Right sinus exhibits no maxillary sinus tenderness and no frontal sinus tenderness. Left sinus exhibits no maxillary sinus tenderness and no frontal sinus tenderness.  Mouth/Throat: Uvula is midline, oropharynx is clear and moist and mucous membranes are normal.  Eyes: Pupils are equal, round, and reactive to light. Conjunctivae, EOM and lids are normal. Lids are everted and swept, no foreign bodies found.  Neck: Trachea normal and normal range of motion. Neck supple. Carotid bruit is not present. No thyroid mass and no thyromegaly present.  Cardiovascular: Normal rate, regular rhythm, S1 normal, S2 normal, normal heart sounds, intact distal pulses and normal pulses. Exam reveals no gallop and no friction rub.  No murmur heard. Pulmonary/Chest: Effort normal and breath sounds normal. No tachypnea. No respiratory distress. She has no decreased breath sounds. She has no wheezes. She has no rhonchi. She has no rales.  Abdominal: Soft. Normal appearance and bowel sounds are normal. There is no tenderness.  Neurological: She is alert.  Skin: Skin is warm, dry and intact. No rash noted.  Psychiatric: Her speech is normal and behavior is normal. Judgment and thought content normal. Her mood appears not anxious. Cognition and memory are normal. She does not exhibit a depressed mood.    Diabetic foot exam:  Normal inspection No skin breakdown Lateral foot calluses  Normal DP pulses Normal sensation to light touch and monofilament Nails normal       Assessment & Plan:

## 2018-03-24 NOTE — Assessment & Plan Note (Signed)
Improved control on sertraline low dose.

## 2018-03-24 NOTE — Assessment & Plan Note (Signed)
Well controlled. Continue current medication.  

## 2018-03-24 NOTE — Assessment & Plan Note (Signed)
Worsened control.. Get back on track with lifestyle now tha mood better.

## 2018-03-24 NOTE — Patient Instructions (Addendum)
Work on low [employer], increase exercise.  Stay on sertraline 25 mg daily.  make sure to have yearly eye exam.

## 2018-03-28 ENCOUNTER — Emergency Department (HOSPITAL_COMMUNITY): Payer: Medicare Other

## 2018-03-28 ENCOUNTER — Emergency Department (HOSPITAL_COMMUNITY)
Admission: EM | Admit: 2018-03-28 | Discharge: 2018-03-28 | Disposition: A | Discharge status: Home / Self Care | Payer: Medicare Other | Attending: Emergency Medicine | Admitting: Emergency Medicine

## 2018-03-28 ENCOUNTER — Other Ambulatory Visit: Payer: Self-pay

## 2018-03-28 ENCOUNTER — Encounter (HOSPITAL_COMMUNITY): Payer: Self-pay

## 2018-03-28 DIAGNOSIS — I2510 Atherosclerotic heart disease of native coronary artery without angina pectoris: Secondary | ICD-10-CM | POA: Insufficient documentation

## 2018-03-28 DIAGNOSIS — R1011 Right upper quadrant pain: Secondary | ICD-10-CM | POA: Diagnosis not present

## 2018-03-28 DIAGNOSIS — Z79899 Other long term (current) drug therapy: Secondary | ICD-10-CM | POA: Insufficient documentation

## 2018-03-28 DIAGNOSIS — R110 Nausea: Secondary | ICD-10-CM | POA: Insufficient documentation

## 2018-03-28 DIAGNOSIS — Z7982 Long term (current) use of aspirin: Secondary | ICD-10-CM | POA: Insufficient documentation

## 2018-03-28 DIAGNOSIS — E119 Type 2 diabetes mellitus without complications: Secondary | ICD-10-CM | POA: Insufficient documentation

## 2018-03-28 DIAGNOSIS — I10 Essential (primary) hypertension: Secondary | ICD-10-CM | POA: Insufficient documentation

## 2018-03-28 DIAGNOSIS — E039 Hypothyroidism, unspecified: Secondary | ICD-10-CM | POA: Diagnosis not present

## 2018-03-28 DIAGNOSIS — K5730 Diverticulosis of large intestine without perforation or abscess without bleeding: Secondary | ICD-10-CM | POA: Diagnosis not present

## 2018-03-28 DIAGNOSIS — R1031 Right lower quadrant pain: Secondary | ICD-10-CM | POA: Insufficient documentation

## 2018-03-28 DIAGNOSIS — R1013 Epigastric pain: Secondary | ICD-10-CM | POA: Insufficient documentation

## 2018-03-28 LAB — CBC
HEMATOCRIT: 40.6 % (ref 36.0–46.0)
Hemoglobin: 13.2 g/dL (ref 12.0–15.0)
MCH: 30.6 pg (ref 26.0–34.0)
MCHC: 32.5 g/dL (ref 30.0–36.0)
MCV: 94 fL (ref 78.0–100.0)
Platelets: 380 10*3/uL (ref 150–400)
RBC: 4.32 MIL/uL (ref 3.87–5.11)
RDW: 13.7 % (ref 11.5–15.5)
WBC: 10 10*3/uL (ref 4.0–10.5)

## 2018-03-28 LAB — URINALYSIS, ROUTINE W REFLEX MICROSCOPIC
BILIRUBIN URINE: NEGATIVE
Glucose, UA: NEGATIVE mg/dL
KETONES UR: NEGATIVE mg/dL
LEUKOCYTES UA: NEGATIVE
NITRITE: NEGATIVE
Protein, ur: NEGATIVE mg/dL
SQUAMOUS EPITHELIAL / LPF: NONE SEEN
Specific Gravity, Urine: 1.03 (ref 1.005–1.030)
pH: 5 (ref 5.0–8.0)

## 2018-03-28 LAB — COMPREHENSIVE METABOLIC PANEL
ALT: 16 U/L (ref 14–54)
ANION GAP: 11 (ref 5–15)
AST: 20 U/L (ref 15–41)
Albumin: 4.2 g/dL (ref 3.5–5.0)
Alkaline Phosphatase: 54 U/L (ref 38–126)
BUN: 24 mg/dL — ABNORMAL HIGH (ref 6–20)
CO2: 22 mmol/L (ref 22–32)
Calcium: 10.2 mg/dL (ref 8.9–10.3)
Chloride: 104 mmol/L (ref 101–111)
Creatinine, Ser: 0.69 mg/dL (ref 0.44–1.00)
Glucose, Bld: 174 mg/dL — ABNORMAL HIGH (ref 65–99)
POTASSIUM: 4.2 mmol/L (ref 3.5–5.1)
Sodium: 137 mmol/L (ref 135–145)
Total Bilirubin: 0.5 mg/dL (ref 0.3–1.2)
Total Protein: 7.5 g/dL (ref 6.5–8.1)

## 2018-03-28 LAB — LIPASE, BLOOD: Lipase: 34 U/L (ref 11–51)

## 2018-03-28 MED ORDER — IOPAMIDOL (ISOVUE-300) INJECTION 61%
INTRAVENOUS | Status: AC
  Administered 2018-03-28: 80 mL
  Filled 2018-03-28: qty 100

## 2018-03-28 MED ORDER — DICYCLOMINE HCL 20 MG PO TABS: 20.0000 mg | ORAL_TABLET | Freq: Two times a day (BID) | ORAL | 0 refills | Status: DC

## 2018-03-28 MED ORDER — PANTOPRAZOLE SODIUM 20 MG PO TBEC: 20.0000 mg | DELAYED_RELEASE_TABLET | Freq: Every day | ORAL | 0 refills | Status: DC

## 2018-03-28 MED ORDER — ONDANSETRON 4 MG PO TBDP: ORAL_TABLET | ORAL | 0 refills | Status: DC

## 2018-03-28 MED ORDER — PANTOPRAZOLE SODIUM 40 MG IV SOLR
40.0000 mg | Freq: Once | INTRAVENOUS | Status: AC
  Administered 2018-03-28: 40 mg via INTRAVENOUS
  Filled 2018-03-28: qty 40

## 2018-03-28 MED ORDER — SODIUM CHLORIDE 0.9 % IV BOLUS
500.0000 mL | Freq: Once | INTRAVENOUS | Status: AC
  Administered 2018-03-28: 500 mL via INTRAVENOUS

## 2018-03-28 NOTE — ED Notes (Signed)
Pt aware of need for urine  

## 2018-03-28 NOTE — ED Triage Notes (Signed)
Pt presents for evaluation of epigastric abdominal pain starting around 0100 this AM. Denies hx of similar pain. Reports mild nausea. Reports normal bowel movements, no vomiting. Tried to take OTC tums with no improvement.

## 2018-03-28 NOTE — ED Provider Notes (Signed)
[facility] EMERGENCY DEPARTMENT Provider Note   CSN: [id number] Arrival date & time: 03/28/18  4332     History   Chief Complaint Chief Complaint  Patient presents with  . Abdominal Pain    HPI Renee [name] is a 72 y.o. female.  Patient is a 72 year old female who presents with abdominal pain.  She states that it started around midnight last night.  It has been waxing and waning since that time.  She describes a sharp pain in her mid abdomen.  It radiates across both sides.  She had some nausea but no vomiting.  No known fevers.  No change in bowel habits.  No urinary symptoms.  No history of similar symptoms in the past.  She is status post appendectomy but still has her gallbladder.     Past Medical History:  Diagnosis Date  . Allergy   . Arthritis   . CAD (coronary artery disease)    non-obstructive. cath 11/11  . Diabetes mellitus    borderline diabetic - diet controlled   . History of chicken pox   . History of phobia    clastrophobia  . Hyperlipidemia   . Hypertension   . Hypothyroidism   . Kidney stones    hx of   . Psoriasis   . Urinary tract infection    hx of     Patient Active Problem List   Diagnosis Date Noted  . GAD (generalized anxiety disorder) 01/31/2018  . Blurred vision 01/17/2018  . Aphasia 01/17/2018  . Osteopenia 12/08/2017  . Vitamin D deficiency 03/04/2016  . Allergic rhinitis 03/04/2016  . Diabetes mellitus without complication ([place]) 95/18/8416  . External hemorrhoid 10/20/2015  . Psoriasis 10/20/2015  . History of bilateral knee replacement 10/20/2015  . Osteoarthritis of knee 11/25/2014  . Essential hypertension 11/06/2010  . CAD, NATIVE VESSEL 11/06/2010  . Dermatitis, eczematoid 12/09/2006  . Hypercholesteremia 01/25/2006  . Adult hypothyroidism 11/15/2005    Past Surgical History:  Procedure Laterality Date  . APPENDECTOMY    . CARDIAC CATHETERIZATION     2011  . CARPAL TUNNEL RELEASE     Left  trigger finger  . COLONOSCOPY WITH PROPOFOL N/A 05/13/2017   Procedure: COLONOSCOPY WITH PROPOFOL;  Surgeon: Manya Silvas, MD;  Location: [facility] ENDOSCOPY;  Service: Endoscopy;  Laterality: N/A;  . EYE SURGERY     lasik - right eye   . TOTAL KNEE ARTHROPLASTY Bilateral 06/12/2015   Procedure: TOTAL KNEE BILATERAL;  Surgeon: Gaynelle Arabian, MD;  Location: [facility] ORS;  Service: Orthopedics;  Laterality: Bilateral;  and epidural  . TUBAL LIGATION    . Tubes tied     and untied. and tied again     OB History   None      Home Medications    Prior to Admission medications   Medication Sig Start Date End Date Taking? Authorizing Provider  aspirin 325 MG EC tablet Take 325 mg by mouth daily.   Yes [provider]  augmented betamethasone dipropionate (DIPROLENE-AF) 0.05 % ointment APPLY TO AFFECTED AREAS ON HANDS TWICE DAILY UNTIL CLEAR 07/25/17  Yes [provider]  diphenhydrAMINE (BENADRYL) 25 MG tablet Take 1 tablet (25 mg total) by mouth at bedtime as needed for sleep. Patient taking differently: Take 25 mg by mouth at bedtime as needed for allergies.  06/17/15  Yes Perkins, Alexzandrew L, PA-C  ezetimibe (ZETIA) 10 MG tablet Take 1 tablet (10 mg total) by mouth daily. 01/31/18  Yes Bedsole,  Amy E, MD  gemfibrozil (LOPID) 600 MG tablet Take 1 tablet (600 mg total) by mouth every morning. 01/31/18  Yes Bedsole, Amy E, MD  glucose blood (ACCU-CHEK COMPACT PLUS) test strip Use to check sugar once time daily.  Dx: E11.9 02/08/17  Yes Bedsole, Amy E, MD  levothyroxine (SYNTHROID, LEVOTHROID) 75 MCG tablet TAKE 75 mcg by mouth once EVERY DAY 01/31/18  Yes Bedsole, Amy E, MD  lisinopril (PRINIVIL,ZESTRIL) 40 MG tablet Take 1 tablet (40 mg total) by mouth daily. 01/31/18  Yes Bedsole, Amy E, MD  sertraline (ZOLOFT) 25 MG tablet Take 1 tablet (25 mg total) by mouth daily. 03/24/18  Yes Bedsole, Amy E, MD  acetaminophen (TYLENOL) 500 MG tablet Take 1,000 mg by mouth every 6 (six) hours as needed  for moderate pain or headache.    [provider]  Blood Glucose Monitoring Suppl (ACCU-CHEK COMPACT CARE KIT) KIT Meter Kit 01/29/16   Margarita Rana, MD  dicyclomine (BENTYL) 20 MG tablet Take 1 tablet (20 mg total) by mouth 2 (two) times daily. 03/28/18   Malvin Johns, MD  diphenhydramine-acetaminophen (TYLENOL PM) 25-500 MG TABS tablet Take 1 tablet by mouth at bedtime as needed (sleep/pain).    [provider]  Emollient (CERAVE) CREA Apply 1 application topically daily as needed (psoriasis).     [provider]  Multiple Vitamin (MULTIVITAMIN) tablet Take 1 tablet by mouth daily.    [provider]  neomycin-polymyxin b-dexamethasone (MAXITROL) 3.5-10000-0.1 SUSP PUT 1 DROP IN LEFT EYE 4 TIMES A DAY FOR 10 DAYS 01/24/18   [provider]  nitroGLYCERIN (NITROSTAT) 0.4 MG SL tablet Place 1 tablet (0.4 mg total) under the tongue every 5 (five) minutes as needed. 09/10/13   Minna Merritts, MD  ondansetron (ZOFRAN ODT) 4 MG disintegrating tablet '4mg'$  ODT q4 hours prn nausea/vomit 03/28/18   Malvin Johns, MD  pantoprazole (PROTONIX) 20 MG tablet Take 1 tablet (20 mg total) by mouth daily. 03/28/18   Malvin Johns, MD  VITAMIN D, CHOLECALCIFEROL, PO Take 2,000 Units by mouth daily.     [provider]    Family History Family History  Problem Relation Age of Onset  . COPD Mother   . Diabetes Sister   . Hypertension Sister   . Diabetes Brother   . Arthritis Brother   . Arthritis Brother     Social History Social History   Tobacco Use  . Smoking status: Never Smoker  . Smokeless tobacco: Never Used  Substance Use Topics  . Alcohol use: No    Alcohol/week: 0.0 oz    Comment: very little around holidays  . Drug use: No     Allergies   Other; Codeine; Metformin; Robaxin [methocarbamol]; and Simvastatin   Review of Systems Review of Systems  Constitutional: Negative for chills, diaphoresis, fatigue and fever.  HENT: Negative  for congestion, rhinorrhea and sneezing.   Eyes: Negative.   Respiratory: Negative for cough, chest tightness and shortness of breath.   Cardiovascular: Negative for chest pain and leg swelling.  Gastrointestinal: Positive for abdominal pain and nausea. Negative for blood in stool, diarrhea and vomiting.  Genitourinary: Negative for difficulty urinating, flank pain, frequency and hematuria.  Musculoskeletal: Negative for arthralgias and back pain.  Skin: Negative for rash.  Neurological: Negative for dizziness, speech difficulty, weakness, numbness and headaches.     Physical Exam Updated Vital Signs BP (!) 145/92   Pulse (!) 59   Temp (!) 97.5 F (36.4 C) (Oral)  Resp 13   Ht '5\' 4"'$  (1.626 m)   Wt 67.1 kg (148 lb)   SpO2 98%   BMI 25.40 kg/m   Physical Exam  Constitutional: She is oriented to person, place, and time. She appears well-developed and well-nourished.  HENT:  Head: Normocephalic and atraumatic.  Eyes: Pupils are equal, round, and reactive to light.  Neck: Normal range of motion. Neck supple.  Cardiovascular: Normal rate, regular rhythm and normal heart sounds.  Pulmonary/Chest: Effort normal and breath sounds normal. No respiratory distress. She has no wheezes. She has no rales. She exhibits no tenderness.  Abdominal: Soft. Bowel sounds are normal. There is tenderness in the right upper quadrant, right lower quadrant and epigastric area. There is no rebound and no guarding.  Musculoskeletal: Normal range of motion. She exhibits no edema.  Lymphadenopathy:    She has no cervical adenopathy.  Neurological: She is alert and oriented to person, place, and time.  Skin: Skin is warm and dry. No rash noted.  Psychiatric: She has a normal mood and affect.     ED Treatments / Results  Labs (all labs ordered are listed, but only abnormal results are displayed) Labs Reviewed  COMPREHENSIVE METABOLIC PANEL - Abnormal; Notable for the following components:      Result  Value   Glucose, Bld 174 (*)    BUN 24 (*)    All other components within normal limits  URINALYSIS, ROUTINE W REFLEX MICROSCOPIC - Abnormal; Notable for the following components:   Color, Urine STRAW (*)    Hgb urine dipstick MODERATE (*)    Bacteria, UA RARE (*)    All other components within normal limits  LIPASE, BLOOD  CBC    EKG EKG Interpretation  Date/Time:  Tuesday March 28 2018 06:58:45 EDT Ventricular Rate:  71 PR Interval:  156 QRS Duration: 72 QT Interval:  380 QTC Calculation: 412 R Axis:   75 Text Interpretation:  Normal sinus rhythm Septal infarct , age undetermined Abnormal ECG since last tracing no significant change Confirmed by Malvin Johns [id number]) on 03/28/2018 8:36:52 AM   Radiology Ct Abdomen Pelvis W Contrast  Result Date: 03/28/2018 CLINICAL DATA:  72 year old female with epigastric abdominal pain. EXAM: CT ABDOMEN AND PELVIS WITH CONTRAST TECHNIQUE: Multidetector CT imaging of the abdomen and pelvis was performed using the standard protocol following bolus administration of intravenous contrast. CONTRAST:  52m ISOVUE-300 IOPAMIDOL (ISOVUE-300) INJECTION 61% COMPARISON:  Prior CT scan of the abdomen and pelvis 06/10/2010 FINDINGS: Lower chest: Marked respiratory motion artifacts limits evaluation for small pulmonary nodules. Within this limitation, the lung bases are essentially clear save for some trace atelectasis versus scarring in the inferior most aspect of the lingula. The visualized cardiac structures are within normal limits for size. Unremarkable visualized distal thoracic esophagus. Hepatobiliary: Diffuse low attenuation of the hepatic parenchyma most consistent with hepatic steatosis. Normal hepatic contour and morphology. No overt cirrhotic changes. No focal lesion. Gallbladder is unremarkable. No intra or extrahepatic biliary ductal dilatation. Pancreas: Unremarkable. No pancreatic ductal dilatation or surrounding inflammatory changes. Spleen: Normal  in size without focal abnormality. Adrenals/Urinary Tract: Adrenal glands are unremarkable. Kidneys are normal, without renal calculi, focal lesion, or hydronephrosis. Bladder is unremarkable. 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 The appendix is surgically absent. Unremarkable terminal ileum. No focal bowel wall thickening or evidence of obstruction. Vascular/Lymphatic: Mild aortic atherosclerotic vascular calcifications without evidence of aneurysm or dissection. No significant stenosis or occlusive disease involving the visceral vasculature to  suggest the possibility of mesenteric ischemia. Retroaortic left renal vein. No suspicious lymphadenopathy. Reproductive: Uterus and bilateral adnexa are unremarkable. Other: Small volume of free fluid is present within the recto uterine cul-de-sac of [place]. The fluid is simple in character. No enhancing nodularity identified. Musculoskeletal: No acute fracture or aggressive appearing lytic or blastic osseous lesion. L5-S1 degenerative disc disease. IMPRESSION: 1. Although no definitive acute abnormality is identified within the abdomen or pelvis, there is a small volume of simple free fluid in the pelvic cul-de-sac which is abnormal in a postmenopausal age female. This may be related to an underlying infectious or inflammatory process of the bowel which is otherwise occult by imaging. 2. 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 3.  Aortic Atherosclerosis (ICD10-170.0). 4. L5-S1 degenerative disc disease. Electronically Signed   By: Jacqulynn Cadet M.D.   On: 03/28/2018 10:00    Procedures Procedures (including critical care time)  Medications Ordered in ED Medications  sodium chloride 0.9 % bolus 500 mL (0 mLs Intravenous Stopped 03/28/18 1022)  pantoprazole (PROTONIX) injection 40 mg (40 mg Intravenous Given 03/28/18 0918)  iopamidol (ISOVUE-300) 61 % injection (80 mLs  Contrast  Given 03/28/18 0904)     Initial Impression / Assessment and Plan / ED Course  I have reviewed the triage vital signs and the nursing notes.  Pertinent labs & imaging results that were available during my care of the patient were reviewed by me and considered in my medical decision making (see chart for details).     Patient is a 72 year old female who presents with abdominal pain.  She was described as epigastric however when I pushed on her belly it was a little bit more generalized.  CT scan was performed which showed no evidence of acute abnormalities other than some trace free fluid.  There is no evidence of infectious etiology.  There is no evidence of gallbladder disease.  She is not specifically tender over her gallbladder.  Her LFTs are normal.  Her other labs are non-concerning.  Her urinalysis is negative for infection.  She is well-appearing.  She has no ongoing nausea or vomiting.  Her pain is well controlled.  She is tolerating oral fluids.  She was discharged home in good condition.  I encouraged her to use only clear liquids for the next 24 hours.  I encouraged her to have a recheck on her abdomen in 24 hours by her PCP to assess for changes.  She was given strict return precautions.  She was given prescriptions for a short course of Bentyl, Zofran and Protonix.  Final Clinical Impressions(s) / ED Diagnoses   Final diagnoses:  Epigastric pain    ED Discharge Orders        Ordered    pantoprazole (PROTONIX) 20 MG tablet  Daily     03/28/18 1300    ondansetron (ZOFRAN ODT) 4 MG disintegrating tablet     03/28/18 1300    dicyclomine (BENTYL) 20 MG tablet  2 times daily     03/28/18 1300       Malvin Johns, MD 03/28/18 1302

## 2018-03-30 ENCOUNTER — Ambulatory Visit (INDEPENDENT_AMBULATORY_CARE_PROVIDER_SITE_OTHER): Payer: Medicare Other | Admitting: Family Medicine

## 2018-03-30 ENCOUNTER — Encounter: Payer: Self-pay | Admitting: Family Medicine

## 2018-03-30 DIAGNOSIS — K529 Noninfective gastroenteritis and colitis, unspecified: Secondary | ICD-10-CM | POA: Diagnosis not present

## 2018-03-30 DIAGNOSIS — R4701 Aphasia: Secondary | ICD-10-CM | POA: Diagnosis not present

## 2018-03-30 DIAGNOSIS — R1013 Epigastric pain: Secondary | ICD-10-CM | POA: Insufficient documentation

## 2018-03-30 NOTE — Patient Instructions (Signed)
Okay to return to aspirin low dose.  Avoid spicy foods, citris, tomato, peppermint, alcohol, soda,  Caffeine.  Take for 3 weeks total the wean off.

## 2018-03-30 NOTE — Progress Notes (Signed)
Subjective:    Patient ID: Renee [name], female    DOB: 26-Dec-1946, 72 y.o.   MRN: [id number]  HPI    72 year old female presents for follow up ER visit  On 4/2/2019for epigastric pain.  Was sudden onset of sharp pain in epigastrium, waxing and waning. No constipation, diarrhea off and on in last 2-3 weeks associated with meds. PER ED MD: on exam pain was more generalized CT scan was performed which showed no evidence of acute abnormalities other than some trace free fluid.  There is no evidence of infectious etiology. Nml lipase, EKG stable,  There is no evidence of gallbladder disease.  She is not specifically tender over her gallbladder.  Her LFTs are normal.  Her other labs are non-concerning.  Her urinalysis is negative for infection.   She was given prescriptions for a short course of Bentyl, Zofran and Protonix.  She is status post appendectomy but still has her gallbladder.  Today she reports:  She is not having any further abdominal pain since being on 20 mg pantoprazole.  She has not used zofran or bentyl.  recent aspirin increase to 325 for possible TIA.. May have caused stomach irritation. She has decreased back to 81 mg dialy.  Blood pressure 140/70, pulse 70, temperature 97.7 F (36.5 C), temperature source Oral, height 5\' 3"  (1.6 m), weight 149 lb 4 oz (67.7 kg).   Review of Systems  Constitutional: Negative for fatigue and fever.  HENT: Negative for congestion.   Eyes: Negative for pain.  Respiratory: Negative for cough and shortness of breath.   Cardiovascular: Negative for chest pain, palpitations and leg swelling.  Gastrointestinal: Positive for diarrhea. Negative for abdominal pain.  Genitourinary: Negative for dysuria and vaginal bleeding.  Musculoskeletal: Negative for back pain.  Neurological: Negative for syncope, light-headedness and headaches.  Psychiatric/Behavioral: Negative for dysphoric mood.       Objective:   Physical Exam  Constitutional:  Vital signs are normal. She appears well-developed and well-nourished. She is cooperative.  Non-toxic appearance. She does not appear ill. No distress.  HENT:  Head: Normocephalic.  Right Ear: Hearing, tympanic membrane, external ear and ear canal normal. Tympanic membrane is not erythematous, not retracted and not bulging.  Left Ear: Hearing, tympanic membrane, external ear and ear canal normal. Tympanic membrane is not erythematous, not retracted and not bulging.  Nose: No mucosal edema or rhinorrhea. Right sinus exhibits no maxillary sinus tenderness and no frontal sinus tenderness. Left sinus exhibits no maxillary sinus tenderness and no frontal sinus tenderness.  Mouth/Throat: Uvula is midline, oropharynx is clear and moist and mucous membranes are normal.  Eyes: Pupils are equal, round, and reactive to light. Conjunctivae, EOM and lids are normal. Lids are everted and swept, no foreign bodies found.  Neck: Trachea normal and normal range of motion. Neck supple. Carotid bruit is not present. No thyroid mass and no thyromegaly present.  Cardiovascular: Normal rate, regular rhythm, S1 normal, S2 normal, normal heart sounds, intact distal pulses and normal pulses. Exam reveals no gallop and no friction rub.  No murmur heard. Pulmonary/Chest: Effort normal and breath sounds normal. No tachypnea. No respiratory distress. She has no decreased breath sounds. She has no wheezes. She has no rhonchi. She has no rales.  Abdominal: Soft. Normal appearance and bowel sounds are normal. There is no hepatosplenomegaly. There is tenderness in the epigastric area. There is no rigidity, no rebound, no guarding, no CVA tenderness, no tenderness at McBurney's point and negative  Murphy's sign. No hernia.   Mild epigatric pain to palpation  Neurological: She is alert.  Skin: Skin is warm, dry and intact. No rash noted.  Psychiatric: Her speech is normal and behavior is normal. Judgment and thought content normal. Her  mood appears not anxious. Cognition and memory are normal. She does not exhibit a depressed mood.          Assessment & Plan:

## 2018-03-30 NOTE — Assessment & Plan Note (Signed)
Off and on for 3 weeks.. She associates with med SE.  Now gone.. If recurrent we can eval further. Unremarkable CT abd.

## 2018-03-30 NOTE — Assessment & Plan Note (Signed)
Likely due to aspirin at higher dose and citris.. Avoid triggers.. Return to ASA 81 mg given not definate TIA and pt at higher risk for issues with it.  Complete 4 week course of PPI with taper.

## 2018-03-30 NOTE — Assessment & Plan Note (Signed)
No further issues.. Not clearly TIA and SE to  325 mg ASA... So decrease back to ASA 81 mg daily

## 2018-04-11 DIAGNOSIS — L570 Actinic keratosis: Secondary | ICD-10-CM | POA: Diagnosis not present

## 2018-04-11 DIAGNOSIS — D225 Melanocytic nevi of trunk: Secondary | ICD-10-CM | POA: Diagnosis not present

## 2018-04-11 DIAGNOSIS — D2272 Melanocytic nevi of left lower limb, including hip: Secondary | ICD-10-CM | POA: Diagnosis not present

## 2018-04-11 DIAGNOSIS — D2271 Melanocytic nevi of right lower limb, including hip: Secondary | ICD-10-CM | POA: Diagnosis not present

## 2018-04-11 DIAGNOSIS — Z85828 Personal history of other malignant neoplasm of skin: Secondary | ICD-10-CM | POA: Diagnosis not present

## 2018-04-11 DIAGNOSIS — Z08 Encounter for follow-up examination after completed treatment for malignant neoplasm: Secondary | ICD-10-CM | POA: Diagnosis not present

## 2018-04-11 DIAGNOSIS — S40262A Insect bite (nonvenomous) of left shoulder, initial encounter: Secondary | ICD-10-CM | POA: Diagnosis not present

## 2018-04-11 DIAGNOSIS — D2262 Melanocytic nevi of left upper limb, including shoulder: Secondary | ICD-10-CM | POA: Diagnosis not present

## 2018-04-11 DIAGNOSIS — D2261 Melanocytic nevi of right upper limb, including shoulder: Secondary | ICD-10-CM | POA: Diagnosis not present

## 2018-04-11 DIAGNOSIS — X32XXXA Exposure to sunlight, initial encounter: Secondary | ICD-10-CM | POA: Diagnosis not present

## 2018-04-17 ENCOUNTER — Encounter: Payer: Self-pay | Admitting: Certified Nurse Midwife

## 2018-04-17 ENCOUNTER — Ambulatory Visit (INDEPENDENT_AMBULATORY_CARE_PROVIDER_SITE_OTHER): Payer: Medicare Other | Admitting: Certified Nurse Midwife

## 2018-04-17 VITALS — BP 130/70 | HR 70 | Ht 64.0 in | Wt 150.0 lb

## 2018-04-17 DIAGNOSIS — Z124 Encounter for screening for malignant neoplasm of cervix: Secondary | ICD-10-CM

## 2018-04-17 DIAGNOSIS — L292 Pruritus vulvae: Secondary | ICD-10-CM | POA: Diagnosis not present

## 2018-04-17 DIAGNOSIS — Z01419 Encounter for gynecological examination (general) (routine) without abnormal findings: Secondary | ICD-10-CM | POA: Diagnosis not present

## 2018-04-17 DIAGNOSIS — L900 Lichen sclerosus et atrophicus: Secondary | ICD-10-CM | POA: Diagnosis not present

## 2018-04-17 LAB — POCT WET PREP (WET MOUNT): Trichomonas Wet Prep HPF POC: ABSENT

## 2018-04-17 NOTE — Progress Notes (Signed)
Gynecology Annual Exam  PCP: Jinny Sanders, MD  Chief Complaint:  Chief Complaint  Patient presents with  . Gynecologic Exam    History of Present Illness:Renee [name] is a 72 year old Caucasian/White female, [place], who presents for her routine gyn exam. She is having some perineal and perirectal itching recently-thinks it may be her lichen sclerosus flaring up. Has been applying clobetasol ointment periodically with temporary relief of itching. Her lichen sclerosis was diagnosed in Jan 2013.  Her menses are absent and she is postmenopausal. She does not have hot flashes.  She has had no spotting.   The patient's past medical history is remarkable for diabetes mellitus controlled on diet, hypertension, anxiety, low vitamin D,hypothyroidism and hypercholesterolemia.  She has a history of osteoarthritis of the knees and has had bilateral knee replacement in 2016.  Since her last annual GYN exam dated 09/08/2015, she has had two skin cancers removed from her legs.  She is not sexually active. She has been sexually active in the past but not currently.  Her most recent pap smear was obtained 09/08/2015 and was NIL.  Her most recent mammogram obtained 12/02/2017 was negative.  There is a positive history of breast cancer in her daughter . Patient unsure if genetic testing has been done.  There is no family history of ovarian cancer.  The patient does do occasional self breast exams.  She had a colonoscopy 05/13/2017 that was WNL.  Her next colonoscopy is due in 5 years.  She had a DEXA scan 12/02/2017 which showed mild osteopenia in the spine (T score was -1.3) only.   The patient does not smoke.  The patient does drink alcohol on occasionally. The patient does not use illegal drugs.  The patient is exercising 2 days a week. The patient does get adequate calcium in her diet.  She had a recent cholesterol screen in 2019 that was normal on her medication. In Feb 2019 her hemoglobin  A1C was 7.3%   Review of Systems: Review of Systems  Constitutional: Negative for chills, fever and weight loss.  HENT: Negative for congestion, sinus pain and sore throat.   Eyes: Negative for blurred vision and pain.  Respiratory: Negative for hemoptysis, shortness of breath and wheezing.   Cardiovascular: Negative for chest pain, palpitations and leg swelling.  Gastrointestinal: Negative for abdominal pain, blood in stool, diarrhea, heartburn, nausea and vomiting.  Genitourinary: Negative for dysuria, frequency, hematuria and urgency.  Musculoskeletal: Negative for back pain, joint pain and myalgias.  Skin: Positive for itching and rash.  Neurological: Negative for dizziness, tingling and headaches.  Endo/Heme/Allergies: Negative for environmental allergies and polydipsia. Does not bruise/bleed easily.       Negative for hirsutism   Psychiatric/Behavioral: Negative for depression. The patient is not nervous/anxious and does not have insomnia.     Past Medical History:  Past Medical History:  Diagnosis Date  . Allergy   . Arthritis   . CAD (coronary artery disease)    non-obstructive. cath 11/11  . Diabetes mellitus    borderline diabetic - diet controlled   . History of chicken pox   . History of phobia    clastrophobia  . Hyperlipidemia   . Hypertension   . Hypothyroidism   . Kidney stones    hx of   . Lichen sclerosus   . Psoriasis   . Urinary tract infection    hx of     Past Surgical History:  Past Surgical History:  Procedure Laterality Date  . APPENDECTOMY    . CARDIAC CATHETERIZATION     2011  . CARPAL TUNNEL RELEASE     Left trigger finger  . COLONOSCOPY WITH PROPOFOL N/A 05/13/2017   Procedure: COLONOSCOPY WITH PROPOFOL;  Surgeon: Manya Silvas, MD;  Location: [facility] ENDOSCOPY;  Service: Endoscopy;  Laterality: N/A;  . EYE SURGERY  2004   lasik - right eye   . KNEE SURGERY Right 2015   arthroscopy  . TOTAL KNEE ARTHROPLASTY Bilateral 06/12/2015    Procedure: TOTAL KNEE BILATERAL;  Surgeon: Gaynelle Arabian, MD;  Location: [facility] ORS;  Service: Orthopedics;  Laterality: Bilateral;  and epidural  . TUBAL LIGATION    . Tubes tied     and untied. and tied again    Family History:  Family History  Problem Relation Age of Onset  . COPD Mother   . Diabetes Mother   . Hypertension Mother   . Hypertension Father   . Diabetes Sister   . Hypertension Sister   . Diabetes Brother   . Arthritis Brother   . Heart disease Brother   . Arthritis Brother   . Diabetes Brother   . Heart disease Brother   . Stomach cancer Maternal Grandfather   . Stroke Maternal Grandfather   . Breast cancer Daughter 91       Double mastectomy    Social History:  Social History   Socioeconomic History  . Marital status: Widowed    Spouse name: Not on file  . Number of children: 6  . Years of education: Not on file  . Highest education level: Not on file  Occupational History  . Occupation: Retired  Scientific laboratory technician  . Financial resource strain: Not on file  . Food insecurity:    Worry: Not on file    Inability: Not on file  . Transportation needs:    Medical: Not on file    Non-medical: Not on file  Tobacco Use  . Smoking status: Never Smoker  . Smokeless tobacco: Never Used  Substance and Sexual Activity  . Alcohol use: Yes    Alcohol/week: 0.0 oz    Comment: occasionally  . Drug use: No  . Sexual activity: Not Currently    Birth control/protection: Post-menopausal  Lifestyle  . Physical activity:    Days per week: 2 days    Minutes per session: 30 min  . Stress: Not at all  Relationships  . Social connections:    Talks on phone: Not on file    Gets together: Not on file    Attends religious service: Not on file    Active member of club or organization: Not on file    Attends meetings of clubs or organizations: Not on file    Relationship status: Not on file  . Intimate partner violence:    Fear of current or ex partner: Not on file     Emotionally abused: Not on file    Physically abused: Not on file    Forced sexual activity: Not on file  Other Topics Concern  . Not on file  Social History Narrative   Regular exercise.    Divorced   Retired at [employer] in [employer], Loss adjuster, chartered.    Allergies:  Allergies  Allergen Reactions  . Other Rash    Metal   . Codeine Nausea And Vomiting  . Metformin Nausea And Vomiting  . Robaxin [Methocarbamol] Nausea Only  . Simvastatin Swelling and Rash  Medications: Prior to Admission medications   Medication Sig Start Date End Date Taking? Authorizing Provider  acetaminophen (TYLENOL) 500 MG tablet Take 1,000 mg by mouth every 6 (six) hours as needed for moderate pain or headache.    [provider]  aspirin 81 mgm tablet Take one tablet by mouth daily.    [provider]  augmented betamethasone dipropionate (DIPROLENE-AF) 0.05 % ointment APPLY TO AFFECTED AREAS ON HANDS TWICE DAILY UNTIL CLEAR 07/25/17   [provider]  Blood Glucose Monitoring Suppl (ACCU-CHEK COMPACT CARE KIT) KIT Meter Kit 01/29/16   Margarita Rana, MD  diphenhydrAMINE (BENADRYL) 25 MG tablet Take 1 tablet (25 mg total) by mouth at bedtime as needed for sleep. Patient taking differently: Take 25 mg by mouth at bedtime as needed for allergies.  06/17/15   Perkins, Alexzandrew L, PA-C  diphenhydramine-acetaminophen (TYLENOL PM) 25-500 MG TABS tablet Take 1 tablet by mouth at bedtime as needed (sleep/pain).    [provider]  Emollient (CERAVE) CREA Apply 1 application topically daily as needed (psoriasis).     [provider]  ezetimibe (ZETIA) 10 MG tablet Take 1 tablet (10 mg total) by mouth daily. 01/31/18   Bedsole, Amy E, MD  gemfibrozil (LOPID) 600 MG tablet Take 1 tablet (600 mg total) by mouth every morning. 01/31/18   Bedsole, Amy E, MD  glucose blood (ACCU-CHEK COMPACT PLUS) test strip Use to check sugar once time daily.  Dx: E11.9 02/08/17   Jinny Sanders, MD    levothyroxine (SYNTHROID, LEVOTHROID) 75 MCG tablet TAKE 75 mcg by mouth once EVERY DAY 01/31/18   Bedsole, Amy E, MD  lisinopril (PRINIVIL,ZESTRIL) 40 MG tablet Take 1 tablet (40 mg total) by mouth daily. 01/31/18   Bedsole, Amy E, MD  Multiple Vitamin (MULTIVITAMIN) tablet Take 1 tablet by mouth daily.    [provider]  neomycin-polymyxin b-dexamethasone (MAXITROL) 3.5-10000-0.1 SUSP PUT 1 DROP IN LEFT EYE 4 TIMES A DAY FOR 10 DAYS 01/24/18   [provider]  nitroGLYCERIN (NITROSTAT) 0.4 MG SL tablet Place 1 tablet (0.4 mg total) under the tongue every 5 (five) minutes as needed. 09/10/13   Minna Merritts, MD  pantoprazole (PROTONIX) 20 MG tablet Take 1 tablet (20 mg total) by mouth daily. 03/28/18   Malvin Johns, MD  sertraline (ZOLOFT) 25 MG tablet Take 1 tablet (25 mg total) by mouth daily. 03/24/18   Bedsole, Amy E, MD  VITAMIN D, CHOLECALCIFEROL, PO Take 2,000 Units by mouth daily.     [provider]    Physical Exam Vitals: BP 130/70   Pulse 70   Ht '5\' 4"'$  (1.626 m)   Wt 150 lb (68 kg)   BMI 25.75 kg/m   General: WF in NAD HEENT: normocephalic, anicteric Neck: no thyroid enlargement, no palpable nodules, no cervical lymphadenopathy  Pulmonary: No increased work of breathing, CTAB Cardiovascular: RRR, without murmur  Breast: Breast symmetrical, no tenderness, no palpable nodules or masses, no skin or nipple retraction present, no nipple discharge.  No axillary, infraclavicular or supraclavicular lymphadenopathy. Abdomen: Soft, non-tender, non-distended.  Umbilicus without lesions.  No hepatomegaly or masses palpable. No evidence of hernia. Genitourinary:  External: whitening and tiny fissures of perineum.  Normal urethral meatus, normal Bartholin's and Skene's glands.    Vagina: flattened rugae, scant clear discharge, no inflammation, fair tone    Cervix: Grossly normal in appearance, friable with Pap, non-tender  Uterus: Anteverted, normal size, shape,  and consistency, mobile, and non-tender  Adnexa:  No adnexal masses, non-tender  Rectal: perianal area inflamed with well demarcated borders, ext hemorrhoid present.  Lymphatic: no evidence of inguinal lymphadenopathy Extremities: no edema, erythema, or tenderness Neurologic: Grossly intact Psychiatric: mood appropriate, affect full  Results for orders placed or performed in visit on 04/17/18 (from the past 24 hour(s))  POCT Wet Prep Lenard Forth [place])     Status: Normal   Collection Time: 04/17/18  8:49 PM  Result Value Ref Range   Source Wet Prep POC vagina    WBC, Wet Prep HPF POC     Bacteria Wet Prep HPF POC  Few   BACTERIA WET PREP MORPHOLOGY POC     Clue Cells Wet Prep HPF POC None None   Clue Cells Wet Prep Whiff POC     Yeast Wet Prep HPF POC None    KOH Wet Prep POC     Trichomonas Wet Prep HPF POC Absent Absent     Assessment: 72 y.o. routine postmenopausal gyn exam Vulvar itching probably due to flare of lichen sclerosus  Resume clobetasol 0.05% BID x 2 weeks then daily x 2 weeks then decrease use to 3 x/week until sx resolved     Plan:    1) Breast cancer screening - recommend monthly self breast exam and screening mammograms every 1-2 years. Next mammogram due 11/2018  2) Osteoporosis prevention: continue vitamin D3 supplements and exercise.  3) Cervical cancer screening - Pap was done.  4) Colon cancer screen-colonoscopy UTD, next due in 2023  5) RTO in 2 years.  Dalia Heading, CNM

## 2018-04-18 DIAGNOSIS — Z01419 Encounter for gynecological examination (general) (routine) without abnormal findings: Secondary | ICD-10-CM | POA: Diagnosis not present

## 2018-04-18 DIAGNOSIS — Z124 Encounter for screening for malignant neoplasm of cervix: Secondary | ICD-10-CM | POA: Diagnosis not present

## 2018-04-18 NOTE — Addendum Note (Signed)
Addended by: Dalia Heading on: 04/18/2018 09:01 AM   Modules accepted: Orders

## 2018-04-21 LAB — PAP IG (IMAGE GUIDED): PAP SMEAR COMMENT: 0

## 2018-04-24 DIAGNOSIS — H2513 Age-related nuclear cataract, bilateral: Secondary | ICD-10-CM | POA: Diagnosis not present

## 2018-04-24 LAB — HM DIABETES EYE EXAM

## 2018-04-25 ENCOUNTER — Encounter: Payer: Self-pay | Admitting: Family Medicine

## 2018-05-02 DIAGNOSIS — H0015 Chalazion left lower eyelid: Secondary | ICD-10-CM | POA: Diagnosis not present

## 2018-05-16 DIAGNOSIS — H0015 Chalazion left lower eyelid: Secondary | ICD-10-CM | POA: Diagnosis not present

## 2018-05-26 DIAGNOSIS — M170 Bilateral primary osteoarthritis of knee: Secondary | ICD-10-CM | POA: Diagnosis not present

## 2018-05-26 DIAGNOSIS — H2512 Age-related nuclear cataract, left eye: Secondary | ICD-10-CM | POA: Diagnosis not present

## 2018-05-26 DIAGNOSIS — Z96653 Presence of artificial knee joint, bilateral: Secondary | ICD-10-CM | POA: Diagnosis not present

## 2018-05-26 DIAGNOSIS — Z471 Aftercare following joint replacement surgery: Secondary | ICD-10-CM | POA: Diagnosis not present

## 2018-05-30 ENCOUNTER — Encounter: Payer: Self-pay | Admitting: *Deleted

## 2018-06-06 ENCOUNTER — Encounter: Payer: Self-pay | Admitting: Certified Registered Nurse Anesthetist

## 2018-06-06 ENCOUNTER — Other Ambulatory Visit: Payer: Self-pay

## 2018-06-06 ENCOUNTER — Ambulatory Visit: Payer: Medicare Other | Admitting: Certified Registered Nurse Anesthetist

## 2018-06-06 ENCOUNTER — Encounter
Admission: RE | Disposition: A | Discharge status: Home / Self Care | Payer: Self-pay | Source: Ambulatory Visit | Attending: Ophthalmology

## 2018-06-06 ENCOUNTER — Ambulatory Visit
Admission: RE | Admit: 2018-06-06 | Discharge: 2018-06-06 | Disposition: A | Discharge status: Home / Self Care | Payer: Medicare Other | Source: Ambulatory Visit | Attending: Ophthalmology | Admitting: Ophthalmology

## 2018-06-06 DIAGNOSIS — I10 Essential (primary) hypertension: Secondary | ICD-10-CM | POA: Diagnosis not present

## 2018-06-06 DIAGNOSIS — Z888 Allergy status to other drugs, medicaments and biological substances status: Secondary | ICD-10-CM | POA: Diagnosis not present

## 2018-06-06 DIAGNOSIS — E7800 Pure hypercholesterolemia, unspecified: Secondary | ICD-10-CM | POA: Insufficient documentation

## 2018-06-06 DIAGNOSIS — H2512 Age-related nuclear cataract, left eye: Secondary | ICD-10-CM | POA: Diagnosis not present

## 2018-06-06 DIAGNOSIS — E039 Hypothyroidism, unspecified: Secondary | ICD-10-CM | POA: Diagnosis not present

## 2018-06-06 DIAGNOSIS — F419 Anxiety disorder, unspecified: Secondary | ICD-10-CM | POA: Diagnosis not present

## 2018-06-06 DIAGNOSIS — E1136 Type 2 diabetes mellitus with diabetic cataract: Secondary | ICD-10-CM | POA: Insufficient documentation

## 2018-06-06 DIAGNOSIS — M109 Gout, unspecified: Secondary | ICD-10-CM | POA: Insufficient documentation

## 2018-06-06 DIAGNOSIS — L409 Psoriasis, unspecified: Secondary | ICD-10-CM | POA: Diagnosis not present

## 2018-06-06 DIAGNOSIS — Z885 Allergy status to narcotic agent status: Secondary | ICD-10-CM | POA: Diagnosis not present

## 2018-06-06 DIAGNOSIS — Z86010 Personal history of colonic polyps: Secondary | ICD-10-CM | POA: Insufficient documentation

## 2018-06-06 DIAGNOSIS — I2510 Atherosclerotic heart disease of native coronary artery without angina pectoris: Secondary | ICD-10-CM | POA: Diagnosis not present

## 2018-06-06 DIAGNOSIS — E119 Type 2 diabetes mellitus without complications: Secondary | ICD-10-CM | POA: Diagnosis not present

## 2018-06-06 DIAGNOSIS — E785 Hyperlipidemia, unspecified: Secondary | ICD-10-CM | POA: Diagnosis not present

## 2018-06-06 DIAGNOSIS — Z96653 Presence of artificial knee joint, bilateral: Secondary | ICD-10-CM | POA: Insufficient documentation

## 2018-06-06 HISTORY — PX: CATARACT EXTRACTION W/PHACO: SHX586

## 2018-06-06 HISTORY — DX: Anxiety disorder, unspecified: F41.9

## 2018-06-06 LAB — GLUCOSE, CAPILLARY: Glucose-Capillary: 145 mg/dL — ABNORMAL HIGH (ref 65–99)

## 2018-06-06 SURGERY — PHACOEMULSIFICATION, CATARACT, WITH IOL INSERTION
Anesthesia: Monitor Anesthesia Care | Site: Eye | Laterality: Left | Wound class: "Clean "

## 2018-06-06 MED ORDER — NA CHONDROIT SULF-NA HYALURON 40-17 MG/ML IO SOLN
INTRAOCULAR | Status: DC | PRN
  Administered 2018-06-06: 1 mL via INTRAOCULAR

## 2018-06-06 MED ORDER — NA CHONDROIT SULF-NA HYALURON 40-17 MG/ML IO SOLN
INTRAOCULAR | Status: AC
  Filled 2018-06-06: qty 1

## 2018-06-06 MED ORDER — MOXIFLOXACIN HCL 0.5 % OP SOLN: 1.0000 [drp] | OPHTHALMIC | Status: DC | PRN

## 2018-06-06 MED ORDER — MOXIFLOXACIN HCL 0.5 % OP SOLN
OPHTHALMIC | Status: AC
  Filled 2018-06-06: qty 3

## 2018-06-06 MED ORDER — MOXIFLOXACIN HCL 0.5 % OP SOLN
OPHTHALMIC | Status: DC | PRN
  Administered 2018-06-06: .2 mL via OPHTHALMIC

## 2018-06-06 MED ORDER — ARMC OPHTHALMIC DILATING DROPS
1.0000 "application " | OPHTHALMIC | Status: AC
  Administered 2018-06-06 (×2): 1 via OPHTHALMIC

## 2018-06-06 MED ORDER — ARMC OPHTHALMIC DILATING DROPS
OPHTHALMIC | Status: AC
  Administered 2018-06-06: 1 via OPHTHALMIC
  Filled 2018-06-06: qty 0.4

## 2018-06-06 MED ORDER — MIDAZOLAM HCL 2 MG/2ML IJ SOLN
INTRAMUSCULAR | Status: DC | PRN
  Administered 2018-06-06 (×2): 0.5 mg via INTRAVENOUS
  Administered 2018-06-06: 1 mg via INTRAVENOUS

## 2018-06-06 MED ORDER — EPINEPHRINE PF 1 MG/ML IJ SOLN
INTRAMUSCULAR | Status: AC
  Filled 2018-06-06: qty 1

## 2018-06-06 MED ORDER — POVIDONE-IODINE 5 % OP SOLN
OPHTHALMIC | Status: AC
  Filled 2018-06-06: qty 30

## 2018-06-06 MED ORDER — SODIUM CHLORIDE 0.9 % IV SOLN
INTRAVENOUS | Status: DC
  Administered 2018-06-06: 10:00:00 via INTRAVENOUS

## 2018-06-06 MED ORDER — EPINEPHRINE PF 1 MG/ML IJ SOLN
INTRAOCULAR | Status: DC | PRN
  Administered 2018-06-06: 1 mL via OPHTHALMIC

## 2018-06-06 MED ORDER — LIDOCAINE HCL (PF) 4 % IJ SOLN
INTRAOCULAR | Status: DC | PRN
  Administered 2018-06-06: 2 mL via OPHTHALMIC

## 2018-06-06 MED ORDER — POVIDONE-IODINE 5 % OP SOLN
OPHTHALMIC | Status: DC | PRN
  Administered 2018-06-06: 1 via OPHTHALMIC

## 2018-06-06 MED ORDER — MIDAZOLAM HCL 2 MG/2ML IJ SOLN
INTRAMUSCULAR | Status: AC
  Filled 2018-06-06: qty 2

## 2018-06-06 MED ORDER — LIDOCAINE HCL (PF) 4 % IJ SOLN
INTRAMUSCULAR | Status: AC
  Filled 2018-06-06: qty 5

## 2018-06-06 MED ORDER — CARBACHOL 0.01 % IO SOLN
INTRAOCULAR | Status: DC | PRN
  Administered 2018-06-06: .5 mL via INTRAOCULAR

## 2018-06-06 SURGICAL SUPPLY — 16 items
GLOVE BIO SURGEON STRL SZ8 (GLOVE) ×3 IMPLANT
GLOVE BIOGEL M 6.5 STRL (GLOVE) ×3 IMPLANT
GLOVE SURG LX 8.0 MICRO (GLOVE) ×2
GLOVE SURG LX STRL 8.0 MICRO (GLOVE) ×1 IMPLANT
GOWN STRL REUS W/ TWL LRG LVL3 (GOWN DISPOSABLE) ×2 IMPLANT
GOWN STRL REUS W/TWL LRG LVL3 (GOWN DISPOSABLE) ×4
LABEL CATARACT MEDS ST (LABEL) ×3 IMPLANT
LENS IOL TECNIS ITEC 22.5 (Intraocular Lens) ×2 IMPLANT
PACK CATARACT (MISCELLANEOUS) ×3 IMPLANT
PACK CATARACT BRASINGTON LX (MISCELLANEOUS) ×3 IMPLANT
PACK EYE AFTER SURG (MISCELLANEOUS) ×3 IMPLANT
SOL BSS BAG (MISCELLANEOUS) ×3
SOLUTION BSS BAG (MISCELLANEOUS) ×1 IMPLANT
SYR 5ML LL (SYRINGE) ×3 IMPLANT
WATER STERILE IRR 250ML POUR (IV SOLUTION) ×3 IMPLANT
WIPE NON LINTING 3.25X3.25 (MISCELLANEOUS) ×3 IMPLANT

## 2018-06-06 NOTE — Anesthesia Post-op Follow-up Note (Signed)
Anesthesia QCDR form completed.        

## 2018-06-06 NOTE — Transfer of Care (Signed)
Immediate Anesthesia Transfer of Care Note  [name]  Procedure(s) Performed: CATARACT EXTRACTION PHACO AND INTRAOCULAR LENS PLACEMENT (IOC) (Left Eye)  Patient Location: Short Stay  Anesthesia Type:MAC  Level of Consciousness: awake, alert , oriented and patient cooperative  Airway & Oxygen Therapy: Patient Spontanous Breathing  Post-op Assessment: Report given to RN and Post -op Vital signs reviewed and stable  Post vital signs: Reviewed and stable  Last Vitals:  Vitals Value Taken Time  BP    Temp    Pulse    Resp    SpO2      Last Pain:  Vitals:   06/06/18 0954  TempSrc: Oral  PainSc: 0-No pain         Complications: No apparent anesthesia complications

## 2018-06-06 NOTE — Discharge Instructions (Signed)
Eye Surgery Discharge Instructions  Expect mild scratchy sensation or mild soreness. DO NOT RUB YOUR EYE!  The day of surgery:  Minimal physical activity, but bed rest is not required  No reading, computer work, or close hand work  No bending, lifting, or straining.  May watch TV  For 24 hours:  No driving, legal decisions, or alcoholic beverages  Safety precautions  Eat anything you prefer: It is better to start with liquids, then soup then solid foods.  _____ Eye patch should be worn until postoperative exam tomorrow.  ____ Solar shield eyeglasses should be worn for comfort in the sunlight/patch while sleeping  Resume all regular medications including aspirin or Coumadin if these were discontinued prior to surgery. You may shower, bathe, shave, or wash your hair. Tylenol may be taken for mild discomfort.  Call your doctor if you experience significant pain, nausea, or vomiting, fever > 101 or other signs of infection. [phone] or [phone] Specific instructions:  Follow-up Information    Birder Robson, MD Follow up.   Specialty:  Ophthalmology Why:  June 11 at 2:15pm Contact information: [address] [place] [address] [phone]

## 2018-06-06 NOTE — Anesthesia Preprocedure Evaluation (Signed)
Anesthesia Evaluation  Patient identified by MRN, date of birth, ID band Patient awake    Reviewed: Allergy & Precautions, H&P , NPO status , Patient's Chart, lab work & pertinent test results  Airway Mallampati: II  TM Distance: >3 FB Neck ROM: full    Dental  (+) Upper Dentures   Pulmonary neg pulmonary ROS,    Pulmonary exam normal breath sounds clear to auscultation       Cardiovascular Exercise Tolerance: Good hypertension, Pt. on medications + CAD  Normal cardiovascular exam Rhythm:regular Rate:Normal     Neuro/Psych claustrophobianegative neurological ROS  negative psych ROS   GI/Hepatic Neg liver ROS, Hx of colon polyps   Endo/Other  diabetes, Well Controlled, Type 2Hypothyroidism Diet controlled borderline diabetic  Renal/GU negative Renal ROSstones  negative genitourinary   Musculoskeletal  (+) Arthritis , Osteoarthritis,    Abdominal   Peds negative pediatric ROS (+)  Hematology negative hematology ROS (+)   Anesthesia Other Findings Past Medical History: No date: Allergy No date: Arthritis No date: CAD (coronary artery disease)     Comment: non-obstructive. cath 11/11 No date: Diabetes mellitus     Comment: borderline diabetic - diet controlled  No date: History of chicken pox No date: History of phobia     Comment: clastrophobia No date: Hyperlipidemia No date: Hypertension No date: Hypothyroidism No date: Kidney stones     Comment: hx of  No date: Psoriasis No date: Urinary tract infection     Comment: hx of   Reproductive/Obstetrics negative OB ROS                             Anesthesia Physical  Anesthesia Plan  ASA: III  Anesthesia Plan: MAC   Post-op Pain Management:    Induction: Intravenous  PONV Risk Score and Plan:   Airway Management Planned: Nasal Cannula  Additional Equipment:   Intra-op Plan:   Post-operative Plan:   Informed  Consent: I have reviewed the patients History and Physical, chart, labs and discussed the procedure including the risks, benefits and alternatives for the proposed anesthesia with the patient or authorized representative who has indicated his/her understanding and acceptance.   Dental Advisory Given  Plan Discussed with: CRNA and Surgeon  Anesthesia Plan Comments:         Anesthesia Quick Evaluation

## 2018-06-06 NOTE — H&P (Signed)
All labs reviewed. Abnormal studies sent to patients PCP when indicated.  Previous H&P reviewed, patient examined, there are NO CHANGES.  Renee Minogue Porfilio6/11/201910:24 AM

## 2018-06-06 NOTE — Op Note (Signed)
PREOPERATIVE DIAGNOSIS:  Nuclear sclerotic cataract of the left eye.   POSTOPERATIVE DIAGNOSIS:  Nuclear sclerotic cataract of the left eye.   OPERATIVE PROCEDURE: Procedure(s): CATARACT EXTRACTION PHACO AND INTRAOCULAR LENS PLACEMENT (IOC)   SURGEON:  Birder Robson, MD.   ANESTHESIA:  Anesthesiologist: Alvin Critchley, MD CRNA: Eben Burow, CRNA  1.      Managed anesthesia care. 2.     0.56ml of Shugarcaine was instilled following the paracentesis   COMPLICATIONS:  None.   TECHNIQUE:   Stop and chop   DESCRIPTION OF PROCEDURE:  The patient was examined and consented in the preoperative holding area where the aforementioned topical anesthesia was applied to the left eye and then brought back to the Operating Room where the left eye was prepped and draped in the usual sterile ophthalmic fashion and a lid speculum was placed. A paracentesis was created with the side port blade and the anterior chamber was filled with viscoelastic. A near clear corneal incision was performed with the steel keratome. A continuous curvilinear capsulorrhexis was performed with a cystotome followed by the capsulorrhexis forceps. Hydrodissection and hydrodelineation were carried out with BSS on a blunt cannula. The lens was removed in a stop and chop  technique and the remaining cortical material was removed with the irrigation-aspiration handpiece. The capsular bag was inflated with viscoelastic and the Technis [id number] lens was placed in the capsular bag without complication. The remaining viscoelastic was removed from the eye with the irrigation-aspiration handpiece. The wounds were hydrated. The anterior chamber was flushed with Miostat and the eye was inflated to physiologic pressure. 0.54ml Vigamox was placed in the anterior chamber. The wounds were found to be water tight. The eye was dressed with Vigamox. The patient was given protective glasses to wear throughout the day and a shield with which to sleep tonight.  The patient was also given drops with which to begin a drop regimen today and will follow-up with me in one day. Implant Name Type Inv. Item Serial No. Manufacturer Lot No. LRB No. Used  LENS IOL DIOP 22.5 - [id number] Intraocular Lens LENS IOL DIOP 22.5 [id number] AMO  Left 1    Procedure(s) with comments: CATARACT EXTRACTION PHACO AND INTRAOCULAR LENS PLACEMENT (IOC) (Left) - [place]  00:23 AP% 16.0 CDE 3.69 Fluid pack lot # [id number]  Electronically signed: Birder Robson 06/06/2018 10:45 AM

## 2018-06-06 NOTE — Anesthesia Postprocedure Evaluation (Signed)
Anesthesia Post Note  [name]  Procedure(s) Performed: CATARACT EXTRACTION PHACO AND INTRAOCULAR LENS PLACEMENT ([place]) (Left Eye)  Patient location during evaluation: Short Stay Anesthesia Type: MAC Level of consciousness: awake and alert, patient cooperative and oriented Pain management: pain level controlled Vital Signs Assessment: post-procedure vital signs reviewed and stable Respiratory status: spontaneous breathing, nonlabored ventilation and respiratory function stable Cardiovascular status: blood pressure returned to baseline Postop Assessment: no headache, no apparent nausea or vomiting and adequate PO intake Anesthetic complications: no     Last Vitals:  Vitals:   06/06/18 0954 06/06/18 1046  BP: (!) 155/69 (!) 135/48  Pulse: 76 69  Resp: 18 16  Temp: 36.6 C (!) 36.2 C  SpO2: 98% 98%    Last Pain:  Vitals:   06/06/18 1046  TempSrc: Temporal  PainSc: 8                  Eben Burow

## 2018-06-08 ENCOUNTER — Telehealth: Payer: Self-pay | Admitting: Family Medicine

## 2018-06-08 MED ORDER — LOSARTAN POTASSIUM 50 MG PO TABS: 50.0000 mg | ORAL_TABLET | Freq: Every day | ORAL | 11 refills | Status: DC

## 2018-06-08 NOTE — Telephone Encounter (Signed)
Dry cough can definitely be due to this med. Stop lisinopril and instead  I called in losartan 50 mg daily to local pharmacy. Follow Bp at home.. Call if > 140/90. If good control and no SE after 1 month.. We can given 90 day supply to [facility]

## 2018-06-08 NOTE — Telephone Encounter (Signed)
Ms. Hollinger notified as instructed by telephone.

## 2018-06-08 NOTE — Telephone Encounter (Signed)
Copied from [place] [id number]. Topic: Quick Communication - Rx Refill/Question >> Jun 08, 2018  2:10 PM Scherrie Gerlach wrote: Medication: lisinopril (PRINIVIL,ZESTRIL) 40 MG tablet Pt states every since this med was increased, she has had dry mouth, sweats in the night,muscle tiredness/fatique, and dry cough and pt thinks it may be because of the med. Pt states others have told her this med could cause these side effects.  Pt would like to know if dr will change to something else. Pt states it was not good before, but now after the increase, it got worse.  [id number] [address], [place] [phone] (Phone) [phone] (Fax)

## 2018-08-18 ENCOUNTER — Other Ambulatory Visit: Payer: Self-pay | Admitting: *Deleted

## 2018-08-18 ENCOUNTER — Encounter: Payer: Self-pay | Admitting: *Deleted

## 2018-08-18 ENCOUNTER — Other Ambulatory Visit: Payer: Self-pay | Admitting: Family Medicine

## 2018-08-18 MED ORDER — EZETIMIBE 10 MG PO TABS: 10.0000 mg | ORAL_TABLET | Freq: Every day | ORAL | 0 refills | Status: DC

## 2018-08-18 MED ORDER — SERTRALINE HCL 25 MG PO TABS: 25.0000 mg | ORAL_TABLET | Freq: Every day | ORAL | 0 refills | Status: DC

## 2018-08-18 MED ORDER — LOSARTAN POTASSIUM 50 MG PO TABS: 50.0000 mg | ORAL_TABLET | Freq: Every day | ORAL | 2 refills | Status: DC

## 2018-08-18 NOTE — Telephone Encounter (Signed)
Copied from [place] [id number]. Topic: Quick Communication - Rx Refill/Question >> Aug 18, 2018  2:43 PM Sheppard Coil, [employer] L wrote: Medication:  sertraline (ZOLOFT) 25 MG tablet ezetimibe (ZETIA) 10 MG tablet losartan (COZAAR) 50 MG tablet  Has the patient contacted their pharmacy? Yes - out of refills would like a 90 day supply called in (Agent: If no, request that the patient contact the pharmacy for the refill.) (Agent: If yes, when and what did the pharmacy advise?)  Preferred Pharmacy (with phone number or street name): [facility] [place], [place] [facility] [phone] (Phone) [phone] (Fax)  Agent: Please be advised that RX refills may take up to 3 business days. We ask that you follow-up with your pharmacy.

## 2018-08-18 NOTE — Telephone Encounter (Signed)
Remaining refills of Losartan sent to [facility]. Refill of Zetia sent to requested pharmacy on 08/18/18

## 2018-09-07 DIAGNOSIS — H2511 Age-related nuclear cataract, right eye: Secondary | ICD-10-CM | POA: Diagnosis not present

## 2018-09-12 ENCOUNTER — Encounter: Payer: Self-pay | Admitting: *Deleted

## 2018-09-19 ENCOUNTER — Other Ambulatory Visit: Payer: Self-pay

## 2018-09-19 ENCOUNTER — Ambulatory Visit
Admission: RE | Admit: 2018-09-19 | Discharge: 2018-09-19 | Disposition: A | Discharge status: Home / Self Care | Payer: Medicare Other | Source: Ambulatory Visit | Attending: Ophthalmology | Admitting: Ophthalmology

## 2018-09-19 ENCOUNTER — Ambulatory Visit: Payer: Medicare Other | Admitting: Certified Registered Nurse Anesthetist

## 2018-09-19 ENCOUNTER — Encounter
Admission: RE | Disposition: A | Discharge status: Home / Self Care | Payer: Self-pay | Source: Ambulatory Visit | Attending: Ophthalmology

## 2018-09-19 ENCOUNTER — Encounter: Payer: Self-pay | Admitting: *Deleted

## 2018-09-19 DIAGNOSIS — Z79899 Other long term (current) drug therapy: Secondary | ICD-10-CM | POA: Diagnosis not present

## 2018-09-19 DIAGNOSIS — I2510 Atherosclerotic heart disease of native coronary artery without angina pectoris: Secondary | ICD-10-CM | POA: Diagnosis not present

## 2018-09-19 DIAGNOSIS — E1136 Type 2 diabetes mellitus with diabetic cataract: Secondary | ICD-10-CM | POA: Insufficient documentation

## 2018-09-19 DIAGNOSIS — E785 Hyperlipidemia, unspecified: Secondary | ICD-10-CM | POA: Insufficient documentation

## 2018-09-19 DIAGNOSIS — E039 Hypothyroidism, unspecified: Secondary | ICD-10-CM | POA: Diagnosis not present

## 2018-09-19 DIAGNOSIS — F419 Anxiety disorder, unspecified: Secondary | ICD-10-CM | POA: Insufficient documentation

## 2018-09-19 DIAGNOSIS — Z96653 Presence of artificial knee joint, bilateral: Secondary | ICD-10-CM | POA: Diagnosis not present

## 2018-09-19 DIAGNOSIS — Z7982 Long term (current) use of aspirin: Secondary | ICD-10-CM | POA: Diagnosis not present

## 2018-09-19 DIAGNOSIS — I10 Essential (primary) hypertension: Secondary | ICD-10-CM | POA: Diagnosis not present

## 2018-09-19 DIAGNOSIS — E7800 Pure hypercholesterolemia, unspecified: Secondary | ICD-10-CM | POA: Insufficient documentation

## 2018-09-19 DIAGNOSIS — H2511 Age-related nuclear cataract, right eye: Secondary | ICD-10-CM | POA: Insufficient documentation

## 2018-09-19 DIAGNOSIS — Z79890 Hormone replacement therapy (postmenopausal): Secondary | ICD-10-CM | POA: Diagnosis not present

## 2018-09-19 DIAGNOSIS — E119 Type 2 diabetes mellitus without complications: Secondary | ICD-10-CM | POA: Diagnosis not present

## 2018-09-19 HISTORY — PX: CATARACT EXTRACTION W/PHACO: SHX586

## 2018-09-19 LAB — GLUCOSE, CAPILLARY: GLUCOSE-CAPILLARY: 164 mg/dL — AB (ref 70–99)

## 2018-09-19 SURGERY — PHACOEMULSIFICATION, CATARACT, WITH IOL INSERTION
Anesthesia: Monitor Anesthesia Care | Site: Eye | Laterality: Right

## 2018-09-19 MED ORDER — CARBACHOL 0.01 % IO SOLN
INTRAOCULAR | Status: DC | PRN
  Administered 2018-09-19: .5 mL via INTRAOCULAR

## 2018-09-19 MED ORDER — MOXIFLOXACIN HCL 0.5 % OP SOLN: 1.0000 [drp] | OPHTHALMIC | Status: DC | PRN

## 2018-09-19 MED ORDER — TETRACAINE HCL 0.5 % OP SOLN
1.0000 [drp] | OPHTHALMIC | Status: AC | PRN
  Administered 2018-09-19 (×3): 1 [drp] via OPHTHALMIC

## 2018-09-19 MED ORDER — TETRACAINE HCL 0.5 % OP SOLN
OPHTHALMIC | Status: AC
  Filled 2018-09-19: qty 4

## 2018-09-19 MED ORDER — LIDOCAINE HCL (PF) 4 % IJ SOLN
INTRAOCULAR | Status: DC | PRN
  Administered 2018-09-19: 2 mL via OPHTHALMIC

## 2018-09-19 MED ORDER — EPINEPHRINE PF 1 MG/ML IJ SOLN
INTRAMUSCULAR | Status: AC
  Filled 2018-09-19: qty 1

## 2018-09-19 MED ORDER — MOXIFLOXACIN HCL 0.5 % OP SOLN
OPHTHALMIC | Status: AC
  Filled 2018-09-19: qty 3

## 2018-09-19 MED ORDER — ARMC OPHTHALMIC DILATING DROPS
OPHTHALMIC | Status: AC
  Filled 2018-09-19: qty 0.5

## 2018-09-19 MED ORDER — MOXIFLOXACIN HCL 0.5 % OP SOLN
OPHTHALMIC | Status: DC | PRN
  Administered 2018-09-19: .2 mL via OPHTHALMIC

## 2018-09-19 MED ORDER — ARMC OPHTHALMIC DILATING DROPS
1.0000 "application " | OPHTHALMIC | Status: AC
  Administered 2018-09-19 (×3): 1 via OPHTHALMIC

## 2018-09-19 MED ORDER — NA CHONDROIT SULF-NA HYALURON 40-17 MG/ML IO SOLN
INTRAOCULAR | Status: AC
  Filled 2018-09-19: qty 1

## 2018-09-19 MED ORDER — LIDOCAINE HCL (PF) 4 % IJ SOLN
INTRAMUSCULAR | Status: AC
  Filled 2018-09-19: qty 5

## 2018-09-19 MED ORDER — MIDAZOLAM HCL 2 MG/2ML IJ SOLN
INTRAMUSCULAR | Status: DC | PRN
  Administered 2018-09-19: 0.5 mg via INTRAVENOUS
  Administered 2018-09-19: 1 mg via INTRAVENOUS

## 2018-09-19 MED ORDER — MIDAZOLAM HCL 2 MG/2ML IJ SOLN
INTRAMUSCULAR | Status: AC
  Filled 2018-09-19: qty 2

## 2018-09-19 MED ORDER — SODIUM CHLORIDE 0.9 % IV SOLN
INTRAVENOUS | Status: DC
  Administered 2018-09-19: 07:00:00 via INTRAVENOUS

## 2018-09-19 MED ORDER — POVIDONE-IODINE 5 % OP SOLN
OPHTHALMIC | Status: AC
  Filled 2018-09-19: qty 30

## 2018-09-19 MED ORDER — EPINEPHRINE PF 1 MG/ML IJ SOLN
INTRAOCULAR | Status: DC | PRN
  Administered 2018-09-19: 1 mL via OPHTHALMIC

## 2018-09-19 MED ORDER — NA CHONDROIT SULF-NA HYALURON 40-17 MG/ML IO SOLN
INTRAOCULAR | Status: DC | PRN
  Administered 2018-09-19: 1 mL via INTRAOCULAR

## 2018-09-19 MED ORDER — POVIDONE-IODINE 5 % OP SOLN
OPHTHALMIC | Status: DC | PRN
  Administered 2018-09-19: 1 via OPHTHALMIC

## 2018-09-19 SURGICAL SUPPLY — 16 items
GLOVE BIO SURGEON STRL SZ8 (GLOVE) ×3 IMPLANT
GLOVE BIOGEL M 6.5 STRL (GLOVE) ×3 IMPLANT
GLOVE SURG LX 8.0 MICRO (GLOVE) ×2
GLOVE SURG LX STRL 8.0 MICRO (GLOVE) ×1 IMPLANT
GOWN STRL REUS W/ TWL LRG LVL3 (GOWN DISPOSABLE) ×2 IMPLANT
GOWN STRL REUS W/TWL LRG LVL3 (GOWN DISPOSABLE) ×4
LABEL CATARACT MEDS ST (LABEL) ×3 IMPLANT
LENS IOL TECNIS ITEC 20.5 (Intraocular Lens) ×3 IMPLANT
PACK CATARACT (MISCELLANEOUS) ×3 IMPLANT
PACK CATARACT BRASINGTON LX (MISCELLANEOUS) ×3 IMPLANT
PACK EYE AFTER SURG (MISCELLANEOUS) ×3 IMPLANT
SOL BSS BAG (MISCELLANEOUS) ×3
SOLUTION BSS BAG (MISCELLANEOUS) ×1 IMPLANT
SYR 5ML LL (SYRINGE) ×3 IMPLANT
WATER STERILE IRR 250ML POUR (IV SOLUTION) ×3 IMPLANT
WIPE NON LINTING 3.25X3.25 (MISCELLANEOUS) ×3 IMPLANT

## 2018-09-19 NOTE — Anesthesia Postprocedure Evaluation (Signed)
Anesthesia Post Note  Patient: [name]  Procedure(s) Performed: CATARACT EXTRACTION PHACO AND INTRAOCULAR LENS PLACEMENT ([place]) (Right Eye)  Patient location during evaluation: Short Stay Anesthesia Type: MAC Level of consciousness: awake and alert, oriented and patient cooperative Pain management: satisfactory to patient Vital Signs Assessment: post-procedure vital signs reviewed and stable Respiratory status: spontaneous breathing, respiratory function stable and nonlabored ventilation Cardiovascular status: blood pressure returned to baseline and stable Postop Assessment: no headache and no apparent nausea or vomiting Anesthetic complications: no     Last Vitals:  Vitals:   09/19/18 0633 09/19/18 0820  BP: (!) 154/96 (!) 143/64  Pulse: 78 67  Resp: 14 16  Temp: 36.9 C 36.4 C  SpO2: 95% 97%    Last Pain:  Vitals:   09/19/18 0820  TempSrc: Oral  PainSc: 0-No pain                 Eben Burow

## 2018-09-19 NOTE — H&P (Signed)
All labs reviewed. Abnormal studies sent to patients PCP when indicated.  Previous H&P reviewed, patient examined, there are NO CHANGES.  Renee [name] Porfilio9/24/20197:52 AM

## 2018-09-19 NOTE — Anesthesia Post-op Follow-up Note (Signed)
Anesthesia QCDR form completed.        

## 2018-09-19 NOTE — Transfer of Care (Signed)
Immediate Anesthesia Transfer of Care Note  [name]  Procedure(s) Performed: CATARACT EXTRACTION PHACO AND INTRAOCULAR LENS PLACEMENT (IOC) (Right Eye)  Patient Location: Short Stay  Anesthesia Type:MAC  Level of Consciousness: awake, alert , oriented and patient cooperative  Airway & Oxygen Therapy: Patient Spontanous Breathing  Post-op Assessment: Report given to RN and Post -op Vital signs reviewed and stable  Post vital signs: Reviewed and stable  Last Vitals:  Vitals Value Taken Time  BP    Temp    Pulse    Resp    SpO2      Last Pain:  Vitals:   09/19/18 0633  TempSrc: Oral  PainSc: 0-No pain         Complications: No apparent anesthesia complications

## 2018-09-19 NOTE — Discharge Instructions (Signed)
Eye Surgery Discharge Instructions    Expect mild scratchy sensation or mild soreness. DO NOT RUB YOUR EYE!  The day of surgery:  Minimal physical activity, but bed rest is not required  No reading, computer work, or close hand work  No bending, lifting, or straining.  May watch TV  For 24 hours:  No driving, legal decisions, or alcoholic beverages  Safety precautions  Eat anything you prefer: It is better to start with liquids, then soup then solid foods.  _____ Eye patch should be worn until postoperative exam tomorrow.  ____ Solar shield eyeglasses should be worn for comfort in the sunlight/patch while sleeping  Resume all regular medications including aspirin or Coumadin if these were discontinued prior to surgery. You may shower, bathe, shave, or wash your hair. Tylenol may be taken for mild discomfort.  Call your doctor if you experience significant pain, nausea, or vomiting, fever > 101 or other signs of infection. [phone] or [phone] Specific instructions:  Follow-up Information    Birder Robson, MD Follow up on 09/20/2018.   Specialty:  Ophthalmology Why:  appointment time 10:40 AM Contact information: [address] [place] [address] [phone]

## 2018-09-19 NOTE — Op Note (Signed)
PREOPERATIVE DIAGNOSIS:  Nuclear sclerotic cataract of the right eye.   POSTOPERATIVE DIAGNOSIS:  nuclear sclerotic cataract right eye   OPERATIVE PROCEDURE: Procedure(s): CATARACT EXTRACTION PHACO AND INTRAOCULAR LENS PLACEMENT (IOC)   SURGEON:  Birder Robson, MD.   ANESTHESIA:  Anesthesiologist: Alvin Critchley, MD CRNA: Eben Burow, CRNA  1.      Managed anesthesia care. 2.      0.90ml of Shugarcaine was instilled in the eye following the paracentesis.   COMPLICATIONS:  None.   TECHNIQUE:   Stop and chop   DESCRIPTION OF PROCEDURE:  The patient was examined and consented in the preoperative holding area where the aforementioned topical anesthesia was applied to the right eye and then brought back to the Operating Room where the right eye was prepped and draped in the usual sterile ophthalmic fashion and a lid speculum was placed. A paracentesis was created with the side port blade and the anterior chamber was filled with viscoelastic. A near clear corneal incision was performed with the steel keratome. A continuous curvilinear capsulorrhexis was performed with a cystotome followed by the capsulorrhexis forceps. Hydrodissection and hydrodelineation were carried out with BSS on a blunt cannula. The lens was removed in a stop and chop  technique and the remaining cortical material was removed with the irrigation-aspiration handpiece. The capsular bag was inflated with viscoelastic and the Technis [id number]  lens was placed in the capsular bag without complication. The remaining viscoelastic was removed from the eye with the irrigation-aspiration handpiece. The wounds were hydrated. The anterior chamber was flushed with Miostat and the eye was inflated to physiologic pressure. 0.44ml of Vigamox was placed in the anterior chamber. The wounds were found to be water tight. The eye was dressed with Vigamox. The patient was given protective glasses to wear throughout the day and a shield with which to  sleep tonight. The patient was also given drops with which to begin a drop regimen today and will follow-up with me in one day. Implant Name Type Inv. Item Serial No. Manufacturer Lot No. LRB No. Used  LENS IOL DIOP 20.5 - [id number] Intraocular Lens LENS IOL DIOP 20.5 [id number] AMO  Right 1   Procedure(s) with comments: CATARACT EXTRACTION PHACO AND INTRAOCULAR LENS PLACEMENT (IOC) (Right) - [place] 00:29.5 AP% 17.0 CDE$ 5.03 Fluid pack lot # [id number]  Electronically signed: Birder Robson 09/19/2018 8:18 AM

## 2018-09-19 NOTE — Anesthesia Preprocedure Evaluation (Signed)
Anesthesia Evaluation  Patient identified by MRN, date of birth, ID band Patient awake    Reviewed: Allergy & Precautions, H&P , NPO status , Patient's Chart, lab work & pertinent test results  Airway Mallampati: II  TM Distance: >3 FB Neck ROM: full    Dental  (+) Upper Dentures   Pulmonary neg pulmonary ROS,    Pulmonary exam normal breath sounds clear to auscultation       Cardiovascular Exercise Tolerance: Good hypertension, Pt. on medications + CAD  Normal cardiovascular exam Rhythm:regular Rate:Normal     Neuro/Psych claustrophobianegative neurological ROS  negative psych ROS   GI/Hepatic Neg liver ROS, Hx of colon polyps   Endo/Other  diabetes, Well Controlled, Type 2Hypothyroidism Diet controlled borderline diabetic  Renal/GU negative Renal ROSstones  negative genitourinary   Musculoskeletal  (+) Arthritis , Osteoarthritis,    Abdominal   Peds negative pediatric ROS (+)  Hematology negative hematology ROS (+)   Anesthesia Other Findings Past Medical History: No date: Allergy No date: Arthritis No date: CAD (coronary artery disease)     Comment: non-obstructive. cath 11/11 No date: Diabetes mellitus     Comment: borderline diabetic - diet controlled  No date: History of chicken pox No date: History of phobia     Comment: clastrophobia No date: Hyperlipidemia No date: Hypertension No date: Hypothyroidism No date: Kidney stones     Comment: hx of  No date: Psoriasis No date: Urinary tract infection     Comment: hx of   Reproductive/Obstetrics negative OB ROS                             Anesthesia Physical  Anesthesia Plan  ASA: III  Anesthesia Plan: MAC   Post-op Pain Management:    Induction: Intravenous  PONV Risk Score and Plan:   Airway Management Planned: Nasal Cannula  Additional Equipment:   Intra-op Plan:   Post-operative Plan:   Informed  Consent: I have reviewed the patients History and Physical, chart, labs and discussed the procedure including the risks, benefits and alternatives for the proposed anesthesia with the patient or authorized representative who has indicated his/her understanding and acceptance.   Dental Advisory Given  Plan Discussed with: CRNA and Surgeon  Anesthesia Plan Comments:         Anesthesia Quick Evaluation

## 2018-09-20 ENCOUNTER — Encounter: Payer: Self-pay | Admitting: Ophthalmology

## 2018-09-25 ENCOUNTER — Telehealth: Payer: Self-pay | Admitting: Family Medicine

## 2018-09-25 ENCOUNTER — Ambulatory Visit: Payer: Medicare Other

## 2018-09-25 ENCOUNTER — Ambulatory Visit (INDEPENDENT_AMBULATORY_CARE_PROVIDER_SITE_OTHER): Payer: Medicare Other

## 2018-09-25 VITALS — BP 130/82 | HR 70 | Temp 98.3°F | Ht 63.25 in | Wt 151.8 lb

## 2018-09-25 DIAGNOSIS — E119 Type 2 diabetes mellitus without complications: Secondary | ICD-10-CM

## 2018-09-25 DIAGNOSIS — E7800 Pure hypercholesterolemia, unspecified: Secondary | ICD-10-CM | POA: Diagnosis not present

## 2018-09-25 DIAGNOSIS — E039 Hypothyroidism, unspecified: Secondary | ICD-10-CM

## 2018-09-25 DIAGNOSIS — Z23 Encounter for immunization: Secondary | ICD-10-CM

## 2018-09-25 DIAGNOSIS — I10 Essential (primary) hypertension: Secondary | ICD-10-CM | POA: Diagnosis not present

## 2018-09-25 DIAGNOSIS — M8580 Other specified disorders of bone density and structure, unspecified site: Secondary | ICD-10-CM

## 2018-09-25 DIAGNOSIS — Z0000 Encounter for general adult medical examination without abnormal findings: Secondary | ICD-10-CM | POA: Diagnosis not present

## 2018-09-25 DIAGNOSIS — E559 Vitamin D deficiency, unspecified: Secondary | ICD-10-CM

## 2018-09-25 LAB — HEMOGLOBIN A1C: Hgb A1c MFr Bld: 7.8 % — ABNORMAL HIGH (ref 4.6–6.5)

## 2018-09-25 LAB — COMPREHENSIVE METABOLIC PANEL WITH GFR
ALT: 19 U/L (ref 0–35)
AST: 24 U/L (ref 0–37)
Albumin: 4.4 g/dL (ref 3.5–5.2)
Alkaline Phosphatase: 54 U/L (ref 39–117)
BUN: 22 mg/dL (ref 6–23)
CO2: 27 meq/L (ref 19–32)
Calcium: 9.9 mg/dL (ref 8.4–10.5)
Chloride: 106 meq/L (ref 96–112)
Creatinine, Ser: 0.68 mg/dL (ref 0.40–1.20)
GFR: 89.88 mL/min
Glucose, Bld: 142 mg/dL — ABNORMAL HIGH (ref 70–99)
Potassium: 3.9 meq/L (ref 3.5–5.1)
Sodium: 141 meq/L (ref 135–145)
Total Bilirubin: 0.4 mg/dL (ref 0.2–1.2)
Total Protein: 7.7 g/dL (ref 6.0–8.3)

## 2018-09-25 LAB — TSH: TSH: 1.98 u[IU]/mL (ref 0.35–4.50)

## 2018-09-25 LAB — LIPID PANEL
CHOL/HDL RATIO: 3
Cholesterol: 122 mg/dL (ref 0–200)
HDL: 35 mg/dL — AB (ref >39.00)
LDL CALC: 71 mg/dL (ref 0–99)
NonHDL: 87.43
TRIGLYCERIDES: 80 mg/dL (ref 0.0–149.0)
VLDL: 16 mg/dL (ref 0.0–40.0)

## 2018-09-25 LAB — T4, FREE: Free T4: 0.99 ng/dL (ref 0.60–1.60)

## 2018-09-25 LAB — T3, FREE: T3 FREE: 3.2 pg/mL (ref 2.3–4.2)

## 2018-09-25 NOTE — Progress Notes (Signed)
PCP notes:   Health maintenance:  A1C - completed Flu vaccine - administered  Abnormal screenings:   None  Patient concerns:   None  Nurse concerns:  None  Next PCP appt:   09/29/18 @ 1115

## 2018-09-25 NOTE — Progress Notes (Signed)
I reviewed health advisor's note, was available for consultation, and agree with documentation and plan.   Signed,  Tamico Mundo T. Mychele Seyller, MD  

## 2018-09-25 NOTE — Progress Notes (Signed)
Subjective:   Renee [name] is a 72 y.o. female who presents for Medicare Annual (Subsequent) preventive examination.  Review of Systems:  N/A Cardiac Risk Factors include: advanced age (>21mn, >>10women);diabetes mellitus;dyslipidemia;hypertension     Objective:     Vitals: BP 130/82 (BP Location: Right Arm, Patient Position: Sitting, Cuff Size: Normal)   Pulse 70   Temp 98.3 F (36.8 C) (Oral)   Ht 5' 3.25" (1.607 m) Comment: shoes  Wt 151 lb 12 oz (68.8 kg)   SpO2 97%   BMI 26.67 kg/m   Body mass index is 26.67 kg/m.  Advanced Directives 09/25/2018 09/19/2018 06/06/2018 03/28/2018 09/20/2017 11/17/2016 07/30/2016  Does Patient Have a Medical Advance Directive? _0  No No  Does patient want to make changes to medical advance directive? - - - - - - -  [place]? - - - - - - -  Would patient like information on creating a medical advance directive? No - Patient declined No - Patient declined No - Patient declined No - Patient declined Yes (MAU/Ambulatory/Procedural Areas - Information given) No - Patient declined Yes - EScientist, clinical (histocompatibility and immunogenetics)given    Tobacco Social History   Tobacco Use  Smoking Status Never Smoker  Smokeless Tobacco Never Used     Counseling given: No   Clinical Intake:  Pre-visit preparation completed: Yes  Pain : No/denies pain     Nutritional Status: BMI 25 -29 Overweight Nutritional Risks: None Diabetes: No  How often do you need to have someone help you when you read instructions, pamphlets, or other written materials from your doctor or pharmacy?: 1 - Never What is the last grade level you completed in school?: 9th grade  Interpreter Needed?: No  Comments: pt is divorced and lives alone Information entered by :: LPinson, LPN  Past Medical History:  Diagnosis Date  . Allergy   . Anxiety   . Arthritis   . CAD (coronary artery disease)    non-obstructive. cath 11/11  . Diabetes mellitus      borderline diabetic - diet controlled   . History of chicken pox   . History of phobia    clastrophobia  . Hyperlipidemia   . Hypertension   . Hypothyroidism   . Kidney stones    hx of   . Lichen sclerosus   . Psoriasis   . Urinary tract infection    hx of    Past Surgical History:  Procedure Laterality Date  . APPENDECTOMY    . CARDIAC CATHETERIZATION     2011  . CARPAL TUNNEL RELEASE     Left trigger finger  . CATARACT EXTRACTION W/PHACO Left 06/06/2018   Procedure: CATARACT EXTRACTION PHACO AND INTRAOCULAR LENS PLACEMENT (IOC);  Surgeon: PBirder Robson MD;  Location: [facility] ORS;  Service: Ophthalmology;  Laterality: Left;  [place] 00:23 AP% 16.0 CDE 3.69 Fluid pack lot # [id number]  . CATARACT EXTRACTION W/PHACO Right 09/19/2018   Procedure: CATARACT EXTRACTION PHACO AND INTRAOCULAR LENS PLACEMENT (IOC);  Surgeon: PBirder Robson MD;  Location: [facility] ORS;  Service: Ophthalmology;  Laterality: Right;  [place]:29.5 AP% 17.0 CDE$ 5.03 Fluid pack lot # [id number]  . COLONOSCOPY WITH PROPOFOL N/A 05/13/2017   Procedure: COLONOSCOPY WITH PROPOFOL;  Surgeon: EManya Silvas MD;  [facility];  Service: Endoscopy;  Laterality: N/A;  . EYE SURGERY  2004   lasik - right eye   . JOINT REPLACEMENT    . KNEE  SURGERY Right 2015   arthroscopy  . TOTAL KNEE ARTHROPLASTY Bilateral 06/12/2015   Procedure: TOTAL KNEE BILATERAL;  Surgeon: Gaynelle Arabian, MD;  Location: [facility];  Service: Orthopedics;  Laterality: Bilateral;  and epidural  . TUBAL LIGATION    . Tubes tied     and untied. and tied again   Family History  Problem Relation Age of Onset  . COPD Mother   . Diabetes Mother   . Hypertension Mother   . Hypertension Father   . Diabetes Sister   . Hypertension Sister   . Diabetes Brother   . Arthritis Brother   . Heart disease Brother   . Arthritis Brother   . Diabetes Brother   . Heart disease Brother   . Stomach cancer Maternal Grandfather   . Stroke Maternal  Grandfather   . Breast cancer Daughter 73       Double mastectomy   Social History   Socioeconomic History  . Marital status: Divorced    Spouse name: Not on file  . Number of children: 6  . Years of education: Not on file  . Highest education level: Not on file  Occupational History  . Occupation: Retired  Scientific laboratory technician  . Financial resource strain: Not on file  . Food insecurity:    Worry: Not on file    Inability: Not on file  . Transportation needs:    Medical: Not on file    Non-medical: Not on file  Tobacco Use  . Smoking status: Never Smoker  . Smokeless tobacco: Never Used  Substance and Sexual Activity  . Alcohol use: Yes    Alcohol/week: 0.0 standard drinks    Comment: occasionally  . Drug use: No  . Sexual activity: Not Currently    Birth control/protection: Post-menopausal  Lifestyle  . Physical activity:    Days per week: 2 days    Minutes per session: 30 min  . Stress: Not at all  Relationships  . Social connections:    Talks on phone: Not on file    Gets together: Not on file    Attends religious service: Not on file    Active member of club or organization: Not on file    Attends meetings of clubs or organizations: Not on file    Relationship status: Not on file  Other Topics Concern  . Not on file  Social History Narrative   Regular exercise.    Divorced   Retired at [employer] in [employer], Loss adjuster, chartered.    Outpatient Encounter Medications as of 09/25/2018  Medication Sig  . aspirin EC 81 MG tablet Take 81 mg by mouth daily.  . Blood Glucose Monitoring Suppl (ACCU-CHEK COMPACT CARE KIT) KIT Meter Kit  . clobetasol ointment (TEMOVATE) 2.54 % Apply 1 application topically daily as needed (psoriasis flares).  . diphenhydrAMINE (BENADRYL) 25 MG tablet Take 1 tablet (25 mg total) by mouth at bedtime as needed for sleep.  Marland Kitchen Emollient (CERAVE) CREA Apply 1 application topically daily as needed (psoriasis).   . ezetimibe (ZETIA) 10 MG tablet Take 1 tablet (10 mg  total) by mouth daily.  Marland Kitchen gemfibrozil (LOPID) 600 MG tablet Take 1 tablet (600 mg total) by mouth every morning.  Marland Kitchen glucose blood (ACCU-CHEK COMPACT PLUS) test strip Use to check sugar once time daily.  Dx: E11.9  . ibuprofen (ADVIL,MOTRIN) 200 MG tablet Take 200 mg by mouth every 6 (six) hours as needed for moderate pain.   Marland Kitchen levothyroxine (SYNTHROID, LEVOTHROID) 75 MCG tablet  TAKE 75 mcg by mouth once EVERY DAY (Patient taking differently: Take 75 mcg by mouth daily before breakfast. TAKE 75 mcg by mouth once EVERY DAY)  . losartan (COZAAR) 50 MG tablet Take 1 tablet (50 mg total) by mouth daily.  . Multiple Vitamin (MULTIVITAMIN) tablet Take 1 tablet by mouth every other day.   . nitroGLYCERIN (NITROSTAT) 0.4 MG SL tablet Place 1 tablet (0.4 mg total) under the tongue every 5 (five) minutes as needed.  . sertraline (ZOLOFT) 25 MG tablet Take 1 tablet (25 mg total) by mouth daily.  . Vitamin D, Cholecalciferol, 1000 units TABS Take 2,000 Units by mouth every other day.   . pantoprazole (PROTONIX) 20 MG tablet Take 1 tablet (20 mg total) by mouth daily. (Patient not taking: Reported on 05/29/2018)   No facility-administered encounter medications on file as of 09/25/2018.     Activities of Daily Living In your present state of health, do you have any difficulty performing the following activities: 09/25/2018 09/19/2018  Hearing? N N  Vision? N N  Difficulty concentrating or making decisions? N N  Walking or climbing stairs? N N  Dressing or bathing? N N  Doing errands, shopping? N -  Preparing Food and eating ? N -  Using the Toilet? N -  In the past six months, have you accidently leaked urine? N -  Do you have problems with loss of bowel control? N -  Managing your Medications? N -  Managing your Finances? N -  Housekeeping or managing your Housekeeping? N -  Some recent data might be hidden    Patient Care Team: Jinny Sanders, MD as PCP - General (Family Medicine) Gaynelle Arabian, MD  as Consulting Physician (Orthopedic Surgery) Minna Merritts, MD as Consulting Physician (Cardiology) Dasher, Rayvon Char, MD as Consulting Physician (Dermatology) Birder Robson, MD as Referring Physician (Ophthalmology)    Assessment:   This is a routine wellness examination for Harman.   Hearing Screening   _0  _1  _2  _3  _4  _5  _6  _7  _8   Right ear:   40 40 40  40    Left ear:   40 40 40  40    Vision Screening Comments: Vision exam in 2019 with Dr. George Ina; recent cataract extraction in Sept 2019    Exercise Activities and Dietary recommendations Current Exercise Habits: The patient does not participate in regular exercise at present, Exercise limited by: None identified  Goals    . Patient Stated     Starting 09/25/2018, I will continue to take medications as prescribed.        Fall Risk Fall Risk  09/25/2018 09/20/2017 07/30/2016  Falls in the past year? No No No   Depression Screen PHQ 2/9 Scores 09/25/2018 09/20/2017 07/30/2016  PHQ - 2 Score 0 0 0  PHQ- 9 Score 0 0 -     Cognitive Function MMSE - Mini Mental State Exam 09/25/2018 09/20/2017 07/30/2016  Orientation to time _9 Orientation to Place _10 Registration _11 Attention/ Calculation 0 0 0  Recall _12 Language- name 2 objects 0 0 0  Language- repeat _13 Language- follow 3 step command _14 Language- read & follow direction 0 0 0  Write a sentence 0 0 0  Copy design 0 0 0  Total score _15 PLEASE NOTE: A Mini-Cog screen was completed. Maximum score is  20. A value of 0 denotes this part of Folstein MMSE was not completed or the patient failed this part of the Mini-Cog screening.   Mini-Cog Screening Orientation to Time - Max 5 pts Orientation to Place - Max 5 pts Registration - Max 3 pts Recall - Max 3 pts Language Repeat - Max 1 pts Language Follow 3 Step Command - Max 3 pts   Immunization History  Administered Date(s) Administered  . H1N1  11/02/2008  . Influenza Split 10/22/2006, 10/28/2007, 09/26/2008, 09/13/2009, 09/12/2010, 09/18/2011, 09/27/2012  . Influenza, High Dose Seasonal PF 09/11/2016  . Influenza,inj,Quad PF,6+ Mos 09/13/2013, 09/13/2014, 09/20/2017, 09/25/2018  . Influenza-Unspecified 08/27/2014, 09/18/2015  . Pneumococcal Conjugate-13 04/05/2014  . Pneumococcal Polysaccharide-23 09/26/2008, 02/08/2017  . Tdap 09/27/2012    Screening Tests Health Maintenance  Topic Date Due  . FOOT EXAM  03/25/2019  . HEMOGLOBIN A1C  03/26/2019  . OPHTHALMOLOGY EXAM  04/25/2019  . MAMMOGRAM  12/03/2019  . TETANUS/TDAP  09/27/2022  . COLONOSCOPY  05/14/2027  . INFLUENZA VACCINE  Completed  . DEXA SCAN  Completed  . Hepatitis C Screening  Completed  . PNA vac Low Risk Adult  Completed      Plan:     I have personally reviewed, addressed, and noted the following in the patient's chart:  A. Medical and social history B. Use of alcohol, tobacco or illicit drugs  C. Current medications and supplements D. Functional ability and status E.  Nutritional status F.  Physical activity G. Advance directives H. List of other physicians I.  Hospitalizations, surgeries, and ER visits in previous 12 months J.  [place] to include hearing, vision, cognitive, depression L. Referrals and appointments - none  In addition, I have reviewed and discussed with patient certain preventive protocols, quality metrics, and best practice recommendations. A written personalized care plan for preventive services as well as general preventive health recommendations were provided to patient.  See attached scanned questionnaire for additional information.   Signed,   Lindell Noe, MHA, BS, LPN Health Coach

## 2018-09-25 NOTE — Telephone Encounter (Signed)
-----   Message from Eustace Pen, LPN sent at [phone]  3:50 PM EDT ----- Regarding: Labs 9/30 Lab orders needed. Thank you.  Insurance:  [employer]

## 2018-09-25 NOTE — Patient Instructions (Addendum)
Renee [name] , Thank you for taking time to come for your Medicare Wellness Visit. I appreciate your ongoing commitment to your health goals. Please review the following plan we discussed and let me know if I can assist you in the future.   These are the goals we discussed: Goals    . Patient Stated     Starting 09/25/2018, I will continue to take medications as prescribed.        This is a list of the screening recommended for you and due dates:  Health Maintenance  Topic Date Due  . Complete foot exam   03/25/2019  . Hemoglobin A1C  03/26/2019  . Eye exam for diabetics  04/25/2019  . Mammogram  12/03/2019  . Tetanus Vaccine  09/27/2022  . Colon Cancer Screening  05/14/2027  . Flu Shot  Completed  . DEXA scan (bone density measurement)  Completed  .  Hepatitis C: One time screening is recommended by Center for Disease Control  (CDC) for  adults born from [birth year] through 1967.   Completed  . Pneumonia vaccines  Completed    Preventive Care for Adults  A healthy lifestyle and preventive care can promote health and wellness. Preventive health guidelines for adults include the following key practices.  . A routine yearly physical is a good way to check with your health care provider about your health and preventive screening. It is a chance to share any concerns and updates on your health and to receive a thorough exam.  . Visit your dentist for a routine exam and preventive care every 6 months. Brush your teeth twice a day and floss once a day. Good oral hygiene prevents tooth decay and gum disease.  . The frequency of eye exams is based on your age, health, family medical history, use  of contact lenses, and other factors. Follow your health care provider's recommendations for frequency of eye exams.  . Eat a healthy diet. Foods like vegetables, fruits, whole grains, low-fat dairy products, and lean protein foods contain the nutrients you need without too many calories. Decrease your  intake of foods high in solid fats, added sugars, and salt. Eat the right amount of calories for you. Get information about a proper diet from your health care provider, if necessary.  . Regular physical exercise is one of the most important things you can do for your health. Most adults should get at least 150 minutes of moderate-intensity exercise (any activity that increases your heart rate and causes you to sweat) each week. In addition, most adults need muscle-strengthening exercises on 2 or more days a week.  Silver Sneakers may be a benefit available to you. To determine eligibility, you may visit the website: www.silversneakers.com or contact program at 240-849-8802 Mon-Fri between 8AM-8PM.   . Maintain a healthy weight. The body mass index (BMI) is a screening tool to identify possible weight problems. It provides an estimate of body fat based on height and weight. Your health care provider can find your BMI and can help you achieve or maintain a healthy weight.   For adults 20 years and older: ? A BMI below 18.5 is considered underweight. ? A BMI of 18.5 to 24.9 is normal. ? A BMI of 25 to 29.9 is considered overweight. ? A BMI of 30 and above is considered obese.   . Maintain normal blood lipids and cholesterol levels by exercising and minimizing your intake of saturated fat. Eat a balanced diet with plenty of fruit and  vegetables. Blood tests for lipids and cholesterol should begin at age 32 and be repeated every 5 years. If your lipid or cholesterol levels are high, you are over 50, or you are at high risk for heart disease, you may need your cholesterol levels checked more frequently. Ongoing high lipid and cholesterol levels should be treated with medicines if diet and exercise are not working.  . If you smoke, find out from your health care provider how to quit. If you do not use tobacco, please do not start.  . If you choose to drink alcohol, please do not consume more than 2  drinks per day. One drink is considered to be 12 ounces (355 mL) of beer, 5 ounces (148 mL) of wine, or 1.5 ounces (44 mL) of liquor.  . If you are 47-89 years old, ask your health care provider if you should take aspirin to prevent strokes.  . Use sunscreen. Apply sunscreen liberally and repeatedly throughout the day. You should seek shade when your shadow is shorter than you. Protect yourself by wearing long sleeves, pants, a wide-brimmed hat, and sunglasses year round, whenever you are outdoors.  . Once a month, do a whole body skin exam, using a mirror to look at the skin on your back. Tell your health care provider of new moles, moles that have irregular borders, moles that are larger than a pencil eraser, or moles that have changed in shape or color.

## 2018-09-29 ENCOUNTER — Telehealth: Payer: Self-pay

## 2018-09-29 ENCOUNTER — Encounter: Payer: Self-pay | Admitting: Family Medicine

## 2018-09-29 ENCOUNTER — Ambulatory Visit (INDEPENDENT_AMBULATORY_CARE_PROVIDER_SITE_OTHER): Payer: Medicare Other | Admitting: Family Medicine

## 2018-09-29 VITALS — BP 142/80 | HR 75 | Temp 97.6°F | Ht 63.25 in | Wt 151.8 lb

## 2018-09-29 DIAGNOSIS — E119 Type 2 diabetes mellitus without complications: Secondary | ICD-10-CM

## 2018-09-29 DIAGNOSIS — L409 Psoriasis, unspecified: Secondary | ICD-10-CM | POA: Diagnosis not present

## 2018-09-29 DIAGNOSIS — I10 Essential (primary) hypertension: Secondary | ICD-10-CM

## 2018-09-29 DIAGNOSIS — F411 Generalized anxiety disorder: Secondary | ICD-10-CM | POA: Diagnosis not present

## 2018-09-29 MED ORDER — SERTRALINE HCL 25 MG PO TABS: 25.0000 mg | ORAL_TABLET | Freq: Every day | ORAL | 1 refills | Status: DC

## 2018-09-29 MED ORDER — DAPAGLIFLOZIN PROPANEDIOL 5 MG PO TABS: 5.0000 mg | ORAL_TABLET | Freq: Every day | ORAL | 11 refills | Status: DC

## 2018-09-29 MED ORDER — GLIPIZIDE ER 5 MG PO TB24: 5.0000 mg | ORAL_TABLET | Freq: Every day | ORAL | 11 refills | Status: DC

## 2018-09-29 MED ORDER — EZETIMIBE 10 MG PO TABS: 10.0000 mg | ORAL_TABLET | Freq: Every day | ORAL | 3 refills | Status: DC

## 2018-09-29 NOTE — Telephone Encounter (Signed)
Pt was prescribed Farxiga.Please advise.

## 2018-09-29 NOTE — Assessment & Plan Note (Signed)
Continue clobetasol

## 2018-09-29 NOTE — Assessment & Plan Note (Signed)
Inadequate control. Given CAD.. Will try to start SGL2 I to treat... If not covered will start glucotrol XL.Marland Kitchen Re-eval in 3 months.  Continue low carb diet.

## 2018-09-29 NOTE — Telephone Encounter (Signed)
Renee [name] notified as instructed by telephone.  She already has glucose meter and testing supplies.

## 2018-09-29 NOTE — Assessment & Plan Note (Signed)
Well controlled. Continue current medication. Encouraged exercise, weight loss, healthy eating habits.  

## 2018-09-29 NOTE — Assessment & Plan Note (Signed)
Well controlled on sertraline daily.

## 2018-09-29 NOTE — Telephone Encounter (Signed)
Copied from [place] [id number]. Topic: Quick Communication - See Telephone Encounter >> Sep 29, 2018  4:35 PM Antonieta Iba C wrote: CRM for notification. See Telephone encounter for: 09/29/18.  Pt called in to make provider aware that the medication for her diabetes that she sent in is to expensive. (Pt isnt sure of the name of medication)   Please assist further

## 2018-09-29 NOTE — Patient Instructions (Addendum)
Working on regular exercise 3-5 times a week.  Continue low carb diet.  Start Farxiga 1 tablet daily for diabetes control.. If insurance does not cover it.. Call us and we can change to a trial of glucotrol.

## 2018-09-29 NOTE — Progress Notes (Signed)
Subjective:    Patient ID: Renee [name], female    DOB: [birth year], 72 y.o.   MRN: [id number]  HPI   The patient presents for complete physical and review of chronic health problems. He/She also has the following acute concerns today:  Flare of psoriasis on hands ( bx proven in past)  Using  Clobetasol ointment .Marland Kitchen Helps control it and improves itching. Wears cotton gloves at night with Eucerin cream  Followed by derm  The patient saw Candis Musa, LPN for medicare wellness. Note reviewed in detail and important notes copied below. Health maintenance:  A1C - completed Flu vaccine - administered Abnormal screenings:  None  09/29/18  Diabetes:   Inadequate control on  no medication.  SE to metformin in past. Lab Results  Component Value Date   HGBA1C 7.8 (H) 09/25/2018  Using medications without difficulties: Hypoglycemic episodes: Hyperglycemic episodes: Feet problems:no ulcers Blood Sugars averaging: Not checking given hand rash eye exam within last year:  Hypertension:    Borderline control in office today on losartan .Marland Kitchen She is anxious about her hands. BP Readings from Last 3 Encounters:  09/29/18 (!) 142/80  09/25/18 130/82  09/19/18 (!) 143/64  Using medication without problems or lightheadedness: none Chest pain with exertion:none Edema:none Short of breath:none Average home BPs:130/80 Other issues:   Elevated Cholesterol:   Good control on zetia, lovaza and lopid, LDL almost < 70.  Hx of CAD SE to statins. Lab Results  Component Value Date   CHOL 122 09/25/2018   HDL 35.00 (L) 09/25/2018   LDLCALC 71 09/25/2018   TRIG 80.0 09/25/2018   CHOLHDL 3 09/25/2018  Using medications without problems: Muscle aches:  Diet compliance: ow carb diet Exercise: minimal Other complaints:  Hypothyroid   Lab Results  Component Value Date   TSH 1.98 09/25/2018    Social History /Family History/Past Medical History reviewed in detail and updated in EMR if  needed. Blood pressure (!) 142/80, pulse 75, temperature 97.6 F (36.4 C), temperature source Oral, height 5' 3.25" (1.607 m), weight 151 lb 12 oz (68.8 kg).   Review of Systems  Constitutional: Negative for fatigue and fever.  HENT: Negative for congestion.   Eyes: Negative for pain.  Respiratory: Negative for cough and shortness of breath.   Cardiovascular: Negative for chest pain, palpitations and leg swelling.  Gastrointestinal: Negative for abdominal pain.  Genitourinary: Negative for dysuria and vaginal bleeding.  Musculoskeletal: Negative for back pain.  Skin: Positive for rash.  Neurological: Negative for syncope, light-headedness and headaches.  Psychiatric/Behavioral: Negative for dysphoric mood.       Objective:   Physical Exam  Constitutional: Vital signs are normal. She appears well-developed and well-nourished. She is cooperative.  Non-toxic appearance. She does not appear ill. No distress.  HENT:  Head: Normocephalic.  Right Ear: Hearing, tympanic membrane, external ear and ear canal normal.  Left Ear: Hearing, tympanic membrane, external ear and ear canal normal.  Nose: Nose normal.  Eyes: Pupils are equal, round, and reactive to light. Conjunctivae, EOM and lids are normal. Lids are everted and swept, no foreign bodies found.  Neck: Trachea normal and normal range of motion. Neck supple. Carotid bruit is not present. No thyroid mass and no thyromegaly present.  Cardiovascular: Normal rate, regular rhythm, S1 normal, S2 normal, normal heart sounds and intact distal pulses. Exam reveals no gallop.  No murmur heard. Pulmonary/Chest: Effort normal and breath sounds normal. No respiratory distress. She has no wheezes. She has no rhonchi.  She has no rales.  Abdominal: Soft. Normal appearance and bowel sounds are normal. She exhibits no distension, no fluid wave, no abdominal bruit and no mass. There is no hepatosplenomegaly. There is no tenderness. There is no rebound, no  guarding and no CVA tenderness. No hernia.  Lymphadenopathy:    She has no cervical adenopathy.    She has no axillary adenopathy.  Neurological: She is alert. She has normal strength. No cranial nerve deficit or sensory deficit.  Skin: Skin is warm, dry and intact. No rash noted.  Erythematous flacky rash on hands and  armss bialterally  Psychiatric: Her speech is normal and behavior is normal. Judgment normal. Her mood appears not anxious. Cognition and memory are normal. She does not exhibit a depressed mood.    Diabetic foot exam: Normal inspection No skin breakdown No calluses  Normal DP pulses Normal sensation to light touch and monofilament Nails normal        Assessment & Plan:  The patient's preventative maintenance and recommended screening tests for an annual wellness exam were reviewed in full today. Brought up to date unless services declined.  Counselled on the importance of diet, exercise, and its role in overall health and mortality. The patient's FH and SH was reviewed, including their home life, tobacco status, and drug and alcohol status.   Vaccines:  Flu already given. Shingles - postponed PNA vaccines - uptodate Foot exam - DONE today Colon cancer screening - colonoscopy 04/2017, plan repeat in 5 years  Mammo: due 11/2018 Bone density -  11/2017 mild osteopenia in spine Hep C screening - done nonsmoker

## 2018-09-29 NOTE — Telephone Encounter (Signed)
Let pt know I have sent in a cheaper med to take in place of expensive farxiga.  Start glipizide ER 5 mg daily in AM with meal. Make sure not to skip meals.  Check fasting blood sugars fasting and 2 hours after meals. Send in meter and test strips, lancets if needed.

## 2018-10-27 ENCOUNTER — Other Ambulatory Visit: Payer: Self-pay | Admitting: Family Medicine

## 2018-10-27 DIAGNOSIS — E119 Type 2 diabetes mellitus without complications: Secondary | ICD-10-CM

## 2018-11-21 ENCOUNTER — Telehealth: Payer: Self-pay | Admitting: Family Medicine

## 2018-11-21 NOTE — Telephone Encounter (Signed)
Pt called in to discuss the Sertraline. She spoke with Dr.Bedsole and they discussed stop taking the medication or slowly drfiting away from the medication. The pt states she only has one refill left and she wants to know do she need to stop taking the medication. Please advise pt

## 2018-11-21 NOTE — Telephone Encounter (Signed)
If she wants to come off sertraline 25 mg daily... Have her go to every other day for 1-2 weeks then stop. reifll if needed.  Okay to continue med if she prefers.

## 2018-11-22 NOTE — Telephone Encounter (Signed)
Renee [name] notified as instructed by telephone.  Patient states understanding.  

## 2018-12-05 NOTE — Progress Notes (Signed)
Cardiology Office Note  Date:  12/06/2018   ID:  Renee [name], DOB 1946-10-21, MRN [id number]  PCP:  Jinny Sanders, MD   Chief Complaint  Patient presents with  . other    12 mo follow up. Medications reviewed verbally. "Doing well"    HPI:  [name] is a very pleasant 72 year old woman with a history of  hypertension,  hyperlipidemia,  long history of diabetes.  Cardiac catheterization October 2011 for chest pain showed mild to moderate non-obstructive CAD. 2010  With  EF of 55% to 60%.   40-50% LAD disease, 60% ostial diagonal disease, 40% PDA History of 2 knee replacements  in [place] She presents today for follow-up of her coronary artery disease  Active , walking, knees do not hurt No chest pain Has GERD On zoloft, weaning off  Lab work reviewed with her HBA1C 7.8 Total chol  122, LDL 71 on zetia Previously unable to tolerate statins,   Now on diabetes medications Lisinopril cough, now on losartan  Daughter is a nurse  EKG personally reviewed by myself on todays visit Shows normal sinus rhythm with rate 74 bpm no significant ST or T wave changes  Previous significant reaction to statins, simvastatin may have caused a rash.     PMH:   has a past medical history of Allergy, Anxiety, Arthritis, CAD (coronary artery disease), Diabetes mellitus, History of chicken pox, History of phobia, Hyperlipidemia, Hypertension, Hypothyroidism, Kidney stones, Lichen sclerosus, Psoriasis, and Urinary tract infection.  PSH:    Past Surgical History:  Procedure Laterality Date  . APPENDECTOMY    . CARDIAC CATHETERIZATION     2011  . CARPAL TUNNEL RELEASE     Left trigger finger  . CATARACT EXTRACTION W/PHACO Left 06/06/2018   Procedure: CATARACT EXTRACTION PHACO AND INTRAOCULAR LENS PLACEMENT (IOC);  Surgeon: Birder Robson, MD;  Location: [facility] ORS;  Service: Ophthalmology;  Laterality: Left;  [place]  00:23 AP% 16.0 CDE 3.69 Fluid pack lot # [id number]  . CATARACT  EXTRACTION W/PHACO Right 09/19/2018   Procedure: CATARACT EXTRACTION PHACO AND INTRAOCULAR LENS PLACEMENT (IOC);  Surgeon: Birder Robson, MD;  Location: [facility] ORS;  Service: Ophthalmology;  Laterality: Right;  [place] 00:29.5 AP% 17.0 CDE$ 5.03 Fluid pack lot # [id number]  . COLONOSCOPY WITH PROPOFOL N/A 05/13/2017   Procedure: COLONOSCOPY WITH PROPOFOL;  Surgeon: Manya Silvas, MD;  Location: [facility] END[facility]COPY;  Service: Endoscopy;  Laterality: N/A;  . EYE SURGERY  2004   lasik - right eye   . JOINT REPLACEMENT    . KNEE SURGERY Right 2015   arthroscopy  . TOTAL KNEE ARTHROPLASTY Bilateral 06/12/2015   Procedure: TOTAL KNEE BILATERAL;  Surgeon: Gaynelle Arabian, MD;  Location: [facility] ORS;  Service: Orthopedics;  Laterality: Bilateral;  and epidural  . TUBAL LIGATION    . Tubes tied     and untied. and tied again    Current Outpatient Medications  Medication Sig Dispense Refill  . ACCU-CHEK COMPACT PLUS test strip USE AS DIRECTED TO TEST BLOOD SUGAR DAILY 100 each 1  . aspirin EC 81 MG tablet Take 81 mg by mouth daily.    . Blood Glucose Monitoring Suppl (ACCU-CHEK COMPACT CARE KIT) KIT Meter Kit 1 each 0  . clobetasol ointment (TEMOVATE) 1.09 % Apply 1 application topically daily as needed (psoriasis flares).    . diphenhydrAMINE (BENADRYL) 25 MG tablet Take 1 tablet (25 mg total) by mouth at bedtime as needed for sleep. 30 tablet 0  . Emollient (CERAVE)  CREA Apply 1 application topically daily as needed (psoriasis).     . ezetimibe (ZETIA) 10 MG tablet Take 1 tablet (10 mg total) by mouth daily. 90 tablet 3  . gemfibrozil (LOPID) 600 MG tablet Take 1 tablet (600 mg total) by mouth every morning. 90 tablet 3  . glipiZIDE (GLUCOTROL XL) 5 MG 24 hr tablet Take 1 tablet (5 mg total) by mouth daily with breakfast. 30 tablet 11  . ibuprofen (ADVIL,MOTRIN) 200 MG tablet Take 200 mg by mouth every 6 (six) hours as needed for moderate pain.     Marland Kitchen levothyroxine (SYNTHROID, LEVOTHROID) 75 MCG tablet TAKE 75  mcg by mouth once EVERY DAY (Patient taking differently: Take 75 mcg by mouth daily before breakfast. TAKE 75 mcg by mouth once EVERY DAY) 90 tablet 3  . losartan (COZAAR) 50 MG tablet Take 1 tablet (50 mg total) by mouth daily. 90 tablet 2  . Multiple Vitamin (MULTIVITAMIN) tablet Take 1 tablet by mouth every other day.     . nitroGLYCERIN (NITROSTAT) 0.4 MG SL tablet Place 1 tablet (0.4 mg total) under the tongue every 5 (five) minutes as needed. 30 tablet 6  . sertraline (ZOLOFT) 25 MG tablet Take 1 tablet (25 mg total) by mouth daily. 90 tablet 1  . Vitamin D, Cholecalciferol, 1000 units TABS Take 2,000 Units by mouth every other day.      No current facility-administered medications for this visit.      Allergies:   Other; Codeine; Metformin; Robaxin [methocarbamol]; and Simvastatin   Social History:  The patient  reports that she has never smoked. She has never used smokeless tobacco. She reports that she drinks alcohol. She reports that she does not use drugs.   Family History:   family history includes Arthritis in her brother and brother; Breast cancer (age of onset: 80) in her daughter; COPD in her mother; Diabetes in her brother, brother, mother, and sister; Heart disease in her brother and brother; Hypertension in her father, mother, and sister; Stomach cancer in her maternal grandfather; Stroke in her maternal grandfather.    Review of Systems: Review of Systems  Constitutional: Negative.   Respiratory: Negative.   Cardiovascular: Negative.   Gastrointestinal: Negative.   Musculoskeletal: Negative.   Neurological: Negative.   Psychiatric/Behavioral: Negative.   All other systems reviewed and are negative.    PHYSICAL EXAM: VS:  BP 138/78 (BP Location: Left Arm, Patient Position: Sitting, Cuff Size: Normal)   Pulse 74   Ht '5\' 4"'$  (1.626 m)   Wt 159 lb (72.1 kg)   BMI 27.29 kg/m  , BMI Body mass index is 27.29 kg/m. Constitutional:  oriented to person, place, and  time. No distress.  HENT:  Head: Grossly normal Eyes:  no discharge. No scleral icterus.  Neck: No JVD, no carotid bruits  Cardiovascular: Regular rate and rhythm, no murmurs appreciated Pulmonary/Chest: Clear to auscultation bilaterally, no wheezes or rails Abdominal: Soft.  no distension.  no tenderness.  Musculoskeletal: Normal range of motion Neurological:  normal muscle tone. Coordination normal. No atrophy Skin: Skin warm and dry Psychiatric: normal affect, pleasant   Recent Labs: 03/28/2018: Hemoglobin 13.2; Platelets 380 09/25/2018: ALT 19; BUN 22; Creatinine, Ser 0.68; Potassium 3.9; Sodium 141; TSH 1.98    Lipid Panel Lab Results  Component Value Date   CHOL 122 09/25/2018   HDL 35.00 (L) 09/25/2018   LDLCALC 71 09/25/2018   TRIG 80.0 09/25/2018      Wt Readings from Last 3 Encounters:  12/06/18 159 lb (72.1 kg)  09/29/18 151 lb 12 oz (68.8 kg)  09/25/18 151 lb 12 oz (68.8 kg)      ASSESSMENT AND PLAN:  Essential hypertension - Plan: EKG 12-Lead Blood pressure is well controlled on today's visit. No changes made to the medications. Stable  Atherosclerosis of native coronary artery of native heart without angina pectoris - Plan: EKG 12-Lead Denies anginal symptoms, no further testing needed Cholesterol at goal  Hypercholesteremia Statin intolerance, continue Zetia LDL 71 Recommend she try to avoid weight gain  Type 2 diabetes mellitus with complication, unspecified long term insulin use status ([place]) We have encouraged continued exercise, careful diet management in an effort to lose weight.   Total encounter time more than 25 minutes  Greater than 50% was spent in counseling and coordination of care with the patient   Disposition:   F/U  12 months   Orders Placed This Encounter  Procedures  . EKG 12-Lead     Signed, Esmond Plants, M.D., Ph.D. 12/06/2018  [place], [place]

## 2018-12-06 ENCOUNTER — Ambulatory Visit (INDEPENDENT_AMBULATORY_CARE_PROVIDER_SITE_OTHER): Payer: Medicare Other | Admitting: Cardiovascular Disease

## 2018-12-06 ENCOUNTER — Encounter: Payer: Self-pay | Admitting: Cardiovascular Disease

## 2018-12-06 VITALS — BP 138/78 | HR 74 | Ht 64.0 in | Wt 159.0 lb

## 2018-12-06 DIAGNOSIS — I10 Essential (primary) hypertension: Secondary | ICD-10-CM | POA: Diagnosis not present

## 2018-12-06 DIAGNOSIS — I25118 Atherosclerotic heart disease of native coronary artery with other forms of angina pectoris: Secondary | ICD-10-CM

## 2018-12-06 DIAGNOSIS — E119 Type 2 diabetes mellitus without complications: Secondary | ICD-10-CM | POA: Diagnosis not present

## 2018-12-06 DIAGNOSIS — E7800 Pure hypercholesterolemia, unspecified: Secondary | ICD-10-CM

## 2018-12-06 DIAGNOSIS — Z1231 Encounter for screening mammogram for malignant neoplasm of breast: Secondary | ICD-10-CM | POA: Diagnosis not present

## 2018-12-06 NOTE — Patient Instructions (Signed)

## 2019-01-02 ENCOUNTER — Telehealth: Payer: Self-pay | Admitting: Family Medicine

## 2019-01-02 ENCOUNTER — Other Ambulatory Visit (INDEPENDENT_AMBULATORY_CARE_PROVIDER_SITE_OTHER): Payer: Medicare Other

## 2019-01-02 DIAGNOSIS — E559 Vitamin D deficiency, unspecified: Secondary | ICD-10-CM

## 2019-01-02 DIAGNOSIS — E119 Type 2 diabetes mellitus without complications: Secondary | ICD-10-CM

## 2019-01-02 DIAGNOSIS — E039 Hypothyroidism, unspecified: Secondary | ICD-10-CM

## 2019-01-02 LAB — HEMOGLOBIN A1C: Hgb A1c MFr Bld: 7.2 % — ABNORMAL HIGH (ref 4.6–6.5)

## 2019-01-02 NOTE — Telephone Encounter (Signed)
-----   Message from Ellamae Sia sent at 12/25/2018 10:53 AM EST ----- Regarding: Lab orders for Tuesday, 1.7.20 Lab orders for a 3 month follow up appt.

## 2019-01-04 ENCOUNTER — Ambulatory Visit (INDEPENDENT_AMBULATORY_CARE_PROVIDER_SITE_OTHER): Payer: Medicare Other | Admitting: Family Medicine

## 2019-01-04 ENCOUNTER — Encounter: Payer: Self-pay | Admitting: Family Medicine

## 2019-01-04 VITALS — BP 130/68 | HR 76 | Temp 97.7°F | Ht 63.25 in | Wt 158.0 lb

## 2019-01-04 DIAGNOSIS — E119 Type 2 diabetes mellitus without complications: Secondary | ICD-10-CM | POA: Diagnosis not present

## 2019-01-04 DIAGNOSIS — I10 Essential (primary) hypertension: Secondary | ICD-10-CM | POA: Diagnosis not present

## 2019-01-04 LAB — HM DIABETES FOOT EXAM

## 2019-01-04 NOTE — Assessment & Plan Note (Signed)
Improved control on glucotrol.. no SE or low.  Continue on low carb diet and current medicaiton.  Re-eval in 3 months.

## 2019-01-04 NOTE — Patient Instructions (Addendum)
Continue glucotrol at current dose.  Keep working on low carb diet, regualr exercise.

## 2019-01-04 NOTE — Assessment & Plan Note (Signed)
Well controlled. Continue current medication.  

## 2019-01-04 NOTE — Progress Notes (Signed)
Subjective:    Patient ID: Renee [name], female    DOB: Apr 16, 1946, 73 y.o.   MRN: [id number]  HPI 73 year old female presents for follow up DM  Diabetes:  Started on glucotrol XL daily given SGLSi  (farxiga)not covered  A1C down from 7.8 in 09/2019 Lab Results  Component Value Date   HGBA1C 7.2 (H) 01/02/2019  Using medications without difficulties: Hypoglycemic episodes: Hyperglycemic episodes: Feet problems: no ulcers Blood Sugars averaging: Not checking lately...  Cannot stick her fingers given rash , psoriasis triggers. eye exam within last year: 03/2018  BP at goal.    Social History /Family History/Past Medical History reviewed in detail and updated in EMR if needed. Blood pressure 130/68, pulse 76, temperature 97.7 F (36.5 C), temperature source Oral, height 5' 3.25" (1.607 m), weight 158 lb (71.7 kg), SpO2 97 %.  Review of Systems  Constitutional: Negative for fatigue and fever.  HENT: Negative for congestion.   Eyes: Negative for pain.  Respiratory: Negative for cough and shortness of breath.   Cardiovascular: Negative for chest pain, palpitations and leg swelling.  Gastrointestinal: Negative for abdominal pain.  Genitourinary: Negative for dysuria and vaginal bleeding.  Musculoskeletal: Negative for back pain.  Neurological: Negative for syncope, light-headedness and headaches.  Psychiatric/Behavioral: Negative for dysphoric mood.       Objective:   Physical Exam Constitutional:      General: She is not in acute distress.    Appearance: Normal appearance. She is well-developed. She is not ill-appearing or toxic-appearing.  HENT:     Head: Normocephalic.     Right Ear: Hearing, tympanic membrane, ear canal and external ear normal. Tympanic membrane is not erythematous, retracted or bulging.     Left Ear: Hearing, tympanic membrane, ear canal and external ear normal. Tympanic membrane is not erythematous, retracted or bulging.     Nose: No mucosal edema or  rhinorrhea.     Right Sinus: No maxillary sinus tenderness or frontal sinus tenderness.     Left Sinus: No maxillary sinus tenderness or frontal sinus tenderness.     Mouth/Throat:     Pharynx: Uvula midline.  Eyes:     General: Lids are normal. Lids are everted, no foreign bodies appreciated.     Conjunctiva/sclera: Conjunctivae normal.     Pupils: Pupils are equal, round, and reactive to light.  Neck:     Musculoskeletal: Normal range of motion and neck supple.     Thyroid: No thyroid mass or thyromegaly.     Vascular: No carotid bruit.     Trachea: Trachea normal.  Cardiovascular:     Rate and Rhythm: Normal rate and regular rhythm.     Pulses: Normal pulses.     Heart sounds: Normal heart sounds, S1 normal and S2 normal. No murmur. No friction rub. No gallop.   Pulmonary:     Effort: Pulmonary effort is normal. No tachypnea or respiratory distress.     Breath sounds: Normal breath sounds. No decreased breath sounds, wheezing, rhonchi or rales.  Abdominal:     General: Bowel sounds are normal.     Palpations: Abdomen is soft.     Tenderness: There is no abdominal tenderness.  Skin:    General: Skin is warm and dry.     Findings: No rash.  Neurological:     Mental Status: She is alert.  Psychiatric:        Mood and Affect: Mood is not anxious or depressed.  Speech: Speech normal.        Behavior: Behavior normal. Behavior is cooperative.        Thought Content: Thought content normal.        Judgment: Judgment normal.     Diabetic foot exam: Normal inspection No skin breakdown No calluses  Normal DP pulses Normal sensation to light touch and monofilament Nails normal       Assessment & Plan:

## 2019-02-20 ENCOUNTER — Other Ambulatory Visit: Payer: Self-pay | Admitting: Family Medicine

## 2019-02-20 DIAGNOSIS — E7800 Pure hypercholesterolemia, unspecified: Secondary | ICD-10-CM

## 2019-02-28 ENCOUNTER — Telehealth: Payer: Self-pay | Admitting: Family Medicine

## 2019-02-28 DIAGNOSIS — E7800 Pure hypercholesterolemia, unspecified: Secondary | ICD-10-CM

## 2019-02-28 MED ORDER — LOSARTAN POTASSIUM 50 MG PO TABS: 50.0000 mg | ORAL_TABLET | Freq: Every day | ORAL | 1 refills | Status: DC

## 2019-02-28 MED ORDER — LEVOTHYROXINE SODIUM 75 MCG PO TABS: ORAL_TABLET | ORAL | 1 refills | Status: DC

## 2019-02-28 MED ORDER — GEMFIBROZIL 600 MG PO TABS: 600.0000 mg | ORAL_TABLET | Freq: Every morning | ORAL | 1 refills | Status: DC

## 2019-02-28 MED ORDER — EZETIMIBE 10 MG PO TABS: 10.0000 mg | ORAL_TABLET | Freq: Every day | ORAL | 1 refills | Status: DC

## 2019-02-28 NOTE — Telephone Encounter (Signed)
Pt need to transfer her medication from [facility]  [facility]   Losartan 50mg   zetia 10mg   Levothyroxine 75 mg  gemfibrozil  600 mg

## 2019-02-28 NOTE — Telephone Encounter (Signed)
[facility] [place] [facility].

## 2019-04-19 ENCOUNTER — Other Ambulatory Visit: Payer: Medicare Other

## 2019-04-20 ENCOUNTER — Ambulatory Visit: Payer: Medicare Other | Admitting: Family Medicine

## 2019-05-30 ENCOUNTER — Telehealth: Payer: Self-pay

## 2019-05-30 NOTE — Telephone Encounter (Signed)
[place] Night - Client Nonclinical Telephone Record AccessNurse Client [place] Night - Client Client Site [place] Physician Eliezer Lofts - MD Contact Type Call Who Is Calling Patient / Member / Family / Caregiver Caller Name Renee [name] Caller Phone Number [phone] Patient Name Renee [name] Patient DOB 1946-08-02 Call Type Message Only Information Provided Reason for Call Request for General Office Information Initial Comment Caller states the Losartan was recalled. Caller states she will be out Monday. Caller is requesting an alternative be sent to [facility]. Additional Comment Call Closed By: Lawrence Santiago Transaction Date/Time: 05/29/2019 5:24:39 PM (ET)

## 2019-06-01 MED ORDER — LOSARTAN POTASSIUM 100 MG PO TABS: 50.0000 mg | ORAL_TABLET | Freq: Every day | ORAL | 1 refills | Status: DC

## 2019-06-01 MED ORDER — EZETIMIBE 10 MG PO TABS: 10.0000 mg | ORAL_TABLET | Freq: Every day | ORAL | 1 refills | Status: DC

## 2019-06-01 NOTE — Telephone Encounter (Signed)
[facility].  [facility] state they have Losartan 100 mg and patient can take 1/2 tablet daily.  Ok per Dr. Diona Browner to change to Losartan 100 mg 1/2 tablet daily.  Ms. Searles notified.  [facility] [place]  Patient also ask for refill on Zetia to be sent to [facility] in [place] because she can get it cheaper there.  Rx sent as requested.

## 2019-06-01 NOTE — Telephone Encounter (Signed)
Have pt call her pharmacy... only some lots recalled. She can get new safe lot if her lot was recalled. No need to change med.

## 2019-06-08 DIAGNOSIS — E119 Type 2 diabetes mellitus without complications: Secondary | ICD-10-CM | POA: Diagnosis not present

## 2019-06-08 LAB — HM DIABETES EYE EXAM

## 2019-06-15 ENCOUNTER — Encounter: Payer: Self-pay | Admitting: Family Medicine

## 2019-07-09 DIAGNOSIS — M3501 Sicca syndrome with keratoconjunctivitis: Secondary | ICD-10-CM | POA: Diagnosis not present

## 2019-07-10 ENCOUNTER — Other Ambulatory Visit: Payer: Self-pay

## 2019-07-10 ENCOUNTER — Ambulatory Visit (INDEPENDENT_AMBULATORY_CARE_PROVIDER_SITE_OTHER): Payer: Medicare Other | Admitting: Family Medicine

## 2019-07-10 ENCOUNTER — Encounter: Payer: Self-pay | Admitting: Family Medicine

## 2019-07-10 ENCOUNTER — Other Ambulatory Visit (INDEPENDENT_AMBULATORY_CARE_PROVIDER_SITE_OTHER): Payer: Medicare Other

## 2019-07-10 ENCOUNTER — Telehealth: Payer: Self-pay | Admitting: Family Medicine

## 2019-07-10 VITALS — BP 140/78 | HR 84 | Temp 98.0°F | Ht 63.25 in | Wt 156.0 lb

## 2019-07-10 DIAGNOSIS — E119 Type 2 diabetes mellitus without complications: Secondary | ICD-10-CM

## 2019-07-10 DIAGNOSIS — I10 Essential (primary) hypertension: Secondary | ICD-10-CM

## 2019-07-10 DIAGNOSIS — R1013 Epigastric pain: Secondary | ICD-10-CM | POA: Diagnosis not present

## 2019-07-10 DIAGNOSIS — E7800 Pure hypercholesterolemia, unspecified: Secondary | ICD-10-CM | POA: Diagnosis not present

## 2019-07-10 DIAGNOSIS — E559 Vitamin D deficiency, unspecified: Secondary | ICD-10-CM

## 2019-07-10 LAB — LIPID PANEL
Cholesterol: 124 mg/dL (ref 0–200)
HDL: 37.8 mg/dL — ABNORMAL LOW (ref >39.00)
LDL Cholesterol: 73 mg/dL (ref 0–99)
NonHDL: 86.22
Total CHOL/HDL Ratio: 3
Triglycerides: 68 mg/dL (ref 0.0–149.0)
VLDL: 13.6 mg/dL (ref 0.0–40.0)

## 2019-07-10 LAB — COMPREHENSIVE METABOLIC PANEL
ALT: 19 U/L (ref 0–35)
AST: 22 U/L (ref 0–37)
Albumin: 4.6 g/dL (ref 3.5–5.2)
Alkaline Phosphatase: 59 U/L (ref 39–117)
BUN: 21 mg/dL (ref 6–23)
CO2: 26 mEq/L (ref 19–32)
Calcium: 9.7 mg/dL (ref 8.4–10.5)
Chloride: 107 mEq/L (ref 96–112)
Creatinine, Ser: 0.69 mg/dL (ref 0.40–1.20)
GFR: 82.97 mL/min (ref >60.00)
Glucose, Bld: 139 mg/dL — ABNORMAL HIGH (ref 70–99)
Potassium: 3.9 mEq/L (ref 3.5–5.1)
Sodium: 142 mEq/L (ref 135–145)
Total Bilirubin: 0.4 mg/dL (ref 0.2–1.2)
Total Protein: 7.6 g/dL (ref 6.0–8.3)

## 2019-07-10 LAB — HEMOGLOBIN A1C: Hgb A1c MFr Bld: 7.2 % — ABNORMAL HIGH (ref 4.6–6.5)

## 2019-07-10 LAB — VITAMIN D 25 HYDROXY (VIT D DEFICIENCY, FRACTURES): VITD: 37.54 ng/mL (ref 30.00–100.00)

## 2019-07-10 NOTE — Assessment & Plan Note (Signed)
Well controlled. Continue current medication.  

## 2019-07-10 NOTE — Progress Notes (Signed)
Chief Complaint  Patient presents with  . Follow-up    3 mo f/u / need refill of glipizide    History of Present Illness: HPI   73 year old female patient presents for 3 month follow up DM .  Diabetes:   Almost but not quite at goal on glipizide Lab Results  Component Value Date   HGBA1C 7.2 (H) 07/10/2019  Using medications without difficulties: Hypoglycemic episodes: none recently Hyperglycemic episodes: Feet problems:no ulcer Blood Sugars averaging: nota checking eye exam within last year: yes  Elevated Cholesterol:  At goal <100 on gemfibrozil and zetia Lab Results  Component Value Date   CHOL 124 07/10/2019   HDL 37.80 (L) 07/10/2019   LDLCALC 73 07/10/2019   TRIG 68.0 07/10/2019   CHOLHDL 3 07/10/2019  Using medications without problems: Muscle aches:  Diet compliance: healthy Exercise: minimal.. gardening Other complaints:  GERD: improved avoiding tomatos and soda.  COVID 19 screen No recent travel or known exposure to COVID19 The patient denies respiratory symptoms of COVID 19 at this time.  The importance of social distancing was discussed today.   Review of Systems  Constitutional: Negative for chills and fever.  HENT: Negative for congestion and ear pain.   Eyes: Negative for pain and redness.  Respiratory: Negative for cough and shortness of breath.   Cardiovascular: Negative for chest pain, palpitations and leg swelling.  Gastrointestinal: Negative for abdominal pain, blood in stool, constipation, diarrhea, nausea and vomiting.  Genitourinary: Negative for dysuria.  Musculoskeletal: Negative for falls and myalgias.  Skin: Negative for rash.  Neurological: Negative for dizziness.  Psychiatric/Behavioral: Negative for depression. The patient is not nervous/anxious.       Past Medical History:  Diagnosis Date  . Allergy   . Anxiety   . Arthritis   . CAD (coronary artery disease)    non-obstructive. cath 11/11  . Diabetes mellitus    borderline diabetic - diet controlled   . History of chicken pox   . History of phobia    clastrophobia  . Hyperlipidemia   . Hypertension   . Hypothyroidism   . Kidney stones    hx of   . Lichen sclerosus   . Psoriasis   . Urinary tract infection    hx of     reports that she has never smoked. She has never used smokeless tobacco. She reports current alcohol use. She reports that she does not use drugs.   Current Outpatient Medications:  .  ACCU-CHEK COMPACT PLUS test strip, USE AS DIRECTED TO TEST BLOOD SUGAR DAILY, Disp: 100 each, Rfl: 1 .  aspirin EC 81 MG tablet, Take 81 mg by mouth daily., Disp: , Rfl:  .  Blood Glucose Monitoring Suppl (ACCU-CHEK COMPACT CARE KIT) KIT, Meter Kit, Disp: 1 each, Rfl: 0 .  clobetasol ointment (TEMOVATE) 2.95 %, Apply 1 application topically daily as needed (psoriasis flares)., Disp: , Rfl:  .  diphenhydrAMINE (BENADRYL) 25 MG tablet, Take 1 tablet (25 mg total) by mouth at bedtime as needed for sleep., Disp: 30 tablet, Rfl: 0 .  Emollient (CERAVE) CREA, Apply 1 application topically daily as needed (psoriasis). , Disp: , Rfl:  .  ezetimibe (ZETIA) 10 MG tablet, Take 1 tablet (10 mg total) by mouth daily., Disp: 90 tablet, Rfl: 1 .  gemfibrozil (LOPID) 600 MG tablet, Take 1 tablet (600 mg total) by mouth every morning., Disp: 90 tablet, Rfl: 1 .  glipiZIDE (GLUCOTROL XL) 5 MG 24 hr tablet, Take 1 tablet (  5 mg total) by mouth daily with breakfast., Disp: 30 tablet, Rfl: 11 .  ibuprofen (ADVIL,MOTRIN) 200 MG tablet, Take 200 mg by mouth every 6 (six) hours as needed for moderate pain. , Disp: , Rfl:  .  levothyroxine (SYNTHROID, LEVOTHROID) 75 MCG tablet, TAKE 1 TABLET DAILY, Disp: 90 tablet, Rfl: 1 .  losartan (COZAAR) 100 MG tablet, Take 0.5 tablets (50 mg total) by mouth daily., Disp: 45 tablet, Rfl: 1 .  Multiple Vitamin (MULTIVITAMIN) tablet, Take 1 tablet by mouth every other day. , Disp: , Rfl:  .  nitroGLYCERIN (NITROSTAT) 0.4 MG SL tablet,  Place 1 tablet (0.4 mg total) under the tongue every 5 (five) minutes as needed., Disp: 30 tablet, Rfl: 6 .  Vitamin D, Cholecalciferol, 1000 units TABS, Take 2,000 Units by mouth every other day. , Disp: , Rfl:    Observations/Objective: Blood pressure 140/78, pulse 84, temperature 98 F (36.7 C), temperature source Temporal, height 5' 3.25" (1.607 m), weight 156 lb (70.8 kg), SpO2 96 %.  Physical Exam Constitutional:      General: She is not in acute distress.    Appearance: Normal appearance. She is well-developed. She is not ill-appearing or toxic-appearing.  HENT:     Head: Normocephalic.     Right Ear: Hearing, tympanic membrane, ear canal and external ear normal. Tympanic membrane is not erythematous, retracted or bulging.     Left Ear: Hearing, tympanic membrane, ear canal and external ear normal. Tympanic membrane is not erythematous, retracted or bulging.     Nose: No mucosal edema or rhinorrhea.     Right Sinus: No maxillary sinus tenderness or frontal sinus tenderness.     Left Sinus: No maxillary sinus tenderness or frontal sinus tenderness.     Mouth/Throat:     Pharynx: Uvula midline.  Eyes:     General: Lids are normal. Lids are everted, no foreign bodies appreciated.     Conjunctiva/sclera: Conjunctivae normal.     Pupils: Pupils are equal, round, and reactive to light.  Neck:     Musculoskeletal: Normal range of motion and neck supple.     Thyroid: No thyroid mass or thyromegaly.     Vascular: No carotid bruit.     Trachea: Trachea normal.  Cardiovascular:     Rate and Rhythm: Normal rate and regular rhythm.     Pulses: Normal pulses.     Heart sounds: Normal heart sounds, S1 normal and S2 normal. No murmur. No friction rub. No gallop.   Pulmonary:     Effort: Pulmonary effort is normal. No tachypnea or respiratory distress.     Breath sounds: Normal breath sounds. No decreased breath sounds, wheezing, rhonchi or rales.  Abdominal:     General: Bowel sounds are  normal.     Palpations: Abdomen is soft.     Tenderness: There is no abdominal tenderness.  Skin:    General: Skin is warm and dry.     Findings: No rash.  Neurological:     Mental Status: She is alert.  Psychiatric:        Mood and Affect: Mood is not anxious or depressed.        Speech: Speech normal.        Behavior: Behavior normal. Behavior is cooperative.        Thought Content: Thought content normal.        Judgment: Judgment normal.      Assessment and Plan Epigastric pain And GERD resolved with trigger avoidance.  Diabetes mellitus without complication (HCC)  Tolerable control on glipizide.  Essential hypertension Well controlled. Continue current medication.   Hypercholesteremia At goal on lopid and zetia.      Eliezer Lofts, MD

## 2019-07-10 NOTE — Assessment & Plan Note (Signed)
At goal on lopid and zetia.

## 2019-07-10 NOTE — Assessment & Plan Note (Signed)
And GERD resolved with trigger avoidance.

## 2019-07-10 NOTE — Telephone Encounter (Signed)
-----   Message from Ellamae Sia sent at 07/05/2019  7:39 AM EDT ----- Regarding: Lab orders for Tuesday, 7.14.20 Lab orders for a 3 month follow up appt.

## 2019-07-10 NOTE — Assessment & Plan Note (Signed)
Tolerable control on glipizide.

## 2019-07-10 NOTE — Patient Instructions (Signed)
Work on low carb diet and get back to increased exercise.

## 2019-08-07 DIAGNOSIS — Z08 Encounter for follow-up examination after completed treatment for malignant neoplasm: Secondary | ICD-10-CM | POA: Diagnosis not present

## 2019-08-07 DIAGNOSIS — Z85828 Personal history of other malignant neoplasm of skin: Secondary | ICD-10-CM | POA: Diagnosis not present

## 2019-08-07 DIAGNOSIS — D0461 Carcinoma in situ of skin of right upper limb, including shoulder: Secondary | ICD-10-CM | POA: Diagnosis not present

## 2019-08-07 DIAGNOSIS — D485 Neoplasm of uncertain behavior of skin: Secondary | ICD-10-CM | POA: Diagnosis not present

## 2019-08-07 DIAGNOSIS — L309 Dermatitis, unspecified: Secondary | ICD-10-CM | POA: Diagnosis not present

## 2019-08-07 DIAGNOSIS — L308 Other specified dermatitis: Secondary | ICD-10-CM | POA: Diagnosis not present

## 2019-08-16 ENCOUNTER — Other Ambulatory Visit: Payer: Self-pay | Admitting: Family Medicine

## 2019-08-28 DIAGNOSIS — Z23 Encounter for immunization: Secondary | ICD-10-CM | POA: Diagnosis not present

## 2019-09-11 DIAGNOSIS — L2084 Intrinsic (allergic) eczema: Secondary | ICD-10-CM | POA: Diagnosis not present

## 2019-09-11 DIAGNOSIS — D0461 Carcinoma in situ of skin of right upper limb, including shoulder: Secondary | ICD-10-CM | POA: Diagnosis not present

## 2019-09-19 ENCOUNTER — Telehealth: Payer: Self-pay | Admitting: Family Medicine

## 2019-09-19 DIAGNOSIS — E039 Hypothyroidism, unspecified: Secondary | ICD-10-CM

## 2019-09-19 DIAGNOSIS — E119 Type 2 diabetes mellitus without complications: Secondary | ICD-10-CM

## 2019-09-19 DIAGNOSIS — E559 Vitamin D deficiency, unspecified: Secondary | ICD-10-CM

## 2019-09-19 NOTE — Telephone Encounter (Signed)
-----   Message from Cloyd Stagers, RT sent at 09/18/2019  3:40 PM EDT ----- Regarding: Lab Orders for Friday 10.2.2020 Please place lab orders for Friday 10.2.2020, office visit for physical on Friday 10.9.2020 Thank you, Dyke Maes RT(R)

## 2019-09-28 ENCOUNTER — Other Ambulatory Visit (INDEPENDENT_AMBULATORY_CARE_PROVIDER_SITE_OTHER): Payer: Medicare Other

## 2019-09-28 DIAGNOSIS — E039 Hypothyroidism, unspecified: Secondary | ICD-10-CM

## 2019-09-28 DIAGNOSIS — E559 Vitamin D deficiency, unspecified: Secondary | ICD-10-CM

## 2019-09-28 DIAGNOSIS — E119 Type 2 diabetes mellitus without complications: Secondary | ICD-10-CM

## 2019-09-28 LAB — COMPREHENSIVE METABOLIC PANEL
ALT: 23 U/L (ref 0–35)
AST: 27 U/L (ref 0–37)
Albumin: 4.6 g/dL (ref 3.5–5.2)
Alkaline Phosphatase: 63 U/L (ref 39–117)
BUN: 23 mg/dL (ref 6–23)
CO2: 25 mEq/L (ref 19–32)
Calcium: 10 mg/dL (ref 8.4–10.5)
Chloride: 107 mEq/L (ref 96–112)
Creatinine, Ser: 0.74 mg/dL (ref 0.40–1.20)
GFR: 76.49 mL/min (ref >60.00)
Glucose, Bld: 160 mg/dL — ABNORMAL HIGH (ref 70–99)
Potassium: 4.1 mEq/L (ref 3.5–5.1)
Sodium: 143 mEq/L (ref 135–145)
Total Bilirubin: 0.5 mg/dL (ref 0.2–1.2)
Total Protein: 7.4 g/dL (ref 6.0–8.3)

## 2019-09-28 LAB — LIPID PANEL
Cholesterol: 142 mg/dL (ref 0–200)
HDL: 38.1 mg/dL — ABNORMAL LOW (ref >39.00)
LDL Cholesterol: 88 mg/dL (ref 0–99)
NonHDL: 103.56
Total CHOL/HDL Ratio: 4
Triglycerides: 77 mg/dL (ref 0.0–149.0)
VLDL: 15.4 mg/dL (ref 0.0–40.0)

## 2019-09-28 LAB — T4, FREE: Free T4: 0.85 ng/dL (ref 0.60–1.60)

## 2019-09-28 LAB — HEMOGLOBIN A1C: Hgb A1c MFr Bld: 7.9 % — ABNORMAL HIGH (ref 4.6–6.5)

## 2019-09-28 LAB — VITAMIN D 25 HYDROXY (VIT D DEFICIENCY, FRACTURES): VITD: 40.99 ng/mL (ref 30.00–100.00)

## 2019-09-28 LAB — TSH: TSH: 4.66 u[IU]/mL — ABNORMAL HIGH (ref 0.35–4.50)

## 2019-09-28 LAB — T3, FREE: T3, Free: 2.8 pg/mL (ref 2.3–4.2)

## 2019-09-28 NOTE — Progress Notes (Signed)
No critical labs need to be addressed urgently. We will discuss labs in detail at upcoming office visit.   

## 2019-10-05 ENCOUNTER — Encounter: Payer: Self-pay | Admitting: Family Medicine

## 2019-10-05 ENCOUNTER — Ambulatory Visit (INDEPENDENT_AMBULATORY_CARE_PROVIDER_SITE_OTHER)
Admission: RE | Admit: 2019-10-05 | Discharge: 2019-10-05 | Disposition: A | Discharge status: Home / Self Care | Payer: Medicare Other | Source: Ambulatory Visit | Attending: Family Medicine | Admitting: Family Medicine

## 2019-10-05 ENCOUNTER — Encounter: Payer: Medicare Other | Admitting: Family Medicine

## 2019-10-05 ENCOUNTER — Ambulatory Visit: Payer: Medicare Other

## 2019-10-05 ENCOUNTER — Ambulatory Visit (INDEPENDENT_AMBULATORY_CARE_PROVIDER_SITE_OTHER): Payer: Medicare Other | Admitting: Family Medicine

## 2019-10-05 ENCOUNTER — Other Ambulatory Visit: Payer: Self-pay

## 2019-10-05 VITALS — BP 140/72 | HR 87 | Temp 98.1°F | Ht 63.0 in | Wt 156.5 lb

## 2019-10-05 DIAGNOSIS — M542 Cervicalgia: Secondary | ICD-10-CM | POA: Diagnosis not present

## 2019-10-05 DIAGNOSIS — G8929 Other chronic pain: Secondary | ICD-10-CM | POA: Insufficient documentation

## 2019-10-05 DIAGNOSIS — E7800 Pure hypercholesterolemia, unspecified: Secondary | ICD-10-CM | POA: Diagnosis not present

## 2019-10-05 DIAGNOSIS — R1310 Dysphagia, unspecified: Secondary | ICD-10-CM | POA: Diagnosis not present

## 2019-10-05 DIAGNOSIS — Z0000 Encounter for general adult medical examination without abnormal findings: Secondary | ICD-10-CM

## 2019-10-05 DIAGNOSIS — I10 Essential (primary) hypertension: Secondary | ICD-10-CM | POA: Diagnosis not present

## 2019-10-05 DIAGNOSIS — L409 Psoriasis, unspecified: Secondary | ICD-10-CM | POA: Diagnosis not present

## 2019-10-05 DIAGNOSIS — E119 Type 2 diabetes mellitus without complications: Secondary | ICD-10-CM | POA: Diagnosis not present

## 2019-10-05 DIAGNOSIS — J3489 Other specified disorders of nose and nasal sinuses: Secondary | ICD-10-CM | POA: Diagnosis not present

## 2019-10-05 LAB — HM DIABETES FOOT EXAM

## 2019-10-05 MED ORDER — LOSARTAN POTASSIUM 100 MG PO TABS: 50.0000 mg | ORAL_TABLET | Freq: Every day | ORAL | 3 refills | Status: DC

## 2019-10-05 MED ORDER — LEVOTHYROXINE SODIUM 75 MCG PO TABS: ORAL_TABLET | ORAL | 3 refills | Status: DC

## 2019-10-05 MED ORDER — GEMFIBROZIL 600 MG PO TABS: 600.0000 mg | ORAL_TABLET | Freq: Every morning | ORAL | 3 refills | Status: DC

## 2019-10-05 MED ORDER — EZETIMIBE 10 MG PO TABS: 10.0000 mg | ORAL_TABLET | Freq: Every day | ORAL | 3 refills | Status: DC

## 2019-10-05 NOTE — Patient Instructions (Addendum)
Low carb diet Increase walking.  We will call with GI referral for difficult swallowing.  Can try instead omeprazole 20 mg ( prilosec) daily.   We will call with  X-ray results.

## 2019-10-05 NOTE — Assessment & Plan Note (Signed)
Well controlled. Continue current medication.  

## 2019-10-05 NOTE — Progress Notes (Signed)
Chief Complaint  Patient presents with  . Medicare Wellness    History of Present Illness: HPI  The patient presents for annual medicare wellness and review of chronic health problems. He/She also has the following acute concerns today: several new issues as follows:   1.intermitant headache off and on monthly.. in occiput on left.. Some pain in neck when move it., no pain or numbness in arms. 2. Left eye dry, irritated and left sinus pressure... dry eye drops helps some  She has seen ENT last year:  CT scan unremarkable.  Deviated septum. Has seen eye MD for cataract issue... sees   Sneeze and  occ congestion. Post nasal drip in AMs She has been using allegra.   3.dysphagia of solid foods > 1 year.. daughter requests referral to Gi.  currently taking some acid reducer  4.Told by dermatologist  ( Dr. Audrea Muscat she needs to see rheumatologist given she has arthritis that could be caused by psoriasis. Has severe hand dermatitis vs psoriasis.  I have personally reviewed the Medicare Annual Wellness questionnaire and have noted 1. The patient's medical and social history 2. Their use of alcohol, tobacco or illicit drugs 3. Their current medications and supplements 4. The patient's functional ability including ADL's, fall risks, home safety risks and hearing or visual             impairment. 5. Diet and physical activities 6. Evidence for depression or mood disorders 7.         Updated provider list. Cognitive evaluation was performed and recorded on pt medicare questionnaire form. The patients weight, height, BMI and visual acuity have been recorded in the chart  I have made referrals, counseling and provided education to the patient based review of the above and I have provided the pt with a written personalized care plan for preventive services.   Documentation of this information was scanned into the electronic record under the media tab.   Advance directives and end of life  planning reviewed in detail with patient and documented in EMR. Patient given handout on advance care directives if needed. HCPOA and living will updated if needed.    Office Visit from 10/05/2019 in [place] at [facility]  PHQ-2 Total Score  0      Fall Risk  10/05/2019 09/25/2018 09/20/2017 07/30/2016  Falls in the past year? 0 No No No     Hearing Screening   Method: Audiometry   '125Hz'$  '250Hz'$  '500Hz'$  '1000Hz'$  '2000Hz'$  '3000Hz'$  '4000Hz'$  '6000Hz'$  '8000Hz'$   Right ear:   '20 20 20  20    '$ Left ear:   '20 20 20  20    '$ Vision Screening Comments: Eye Exam with dr. Merla Riches 07/09/2019 at [facility]  Hypertension:    Tolerable control on losartan BP Readings from Last 3 Encounters:  10/05/19 140/72  07/10/19 140/78  01/04/19 130/68  Using medication without problems or lightheadedness: none Chest pain with exertion:none Edema:none Short of breath:none Average home BPs: Other issues:  Vi D in nml range  Diabetes:   Inadequate control on glucotrol.. worsened control Lab Results  Component Value Date   HGBA1C 7.9 (H) 09/28/2019  Using medications without difficulties: Hypoglycemic episodes: Hyperglycemic episodes: Feet problems: Blood Sugars averaging: eye exam within last year: 05/2019  Elevated Cholesterol:  At goal on lopid and zetia. Lab Results  Component Value Date   CHOL 142 09/28/2019   HDL 38.10 (L) 09/28/2019   LDLCALC 88 09/28/2019   TRIG 77.0 09/28/2019  CHOLHDL 4 09/28/2019  Using medications without problems:none Muscle aches: none Diet compliance: moderate Exercise: active Other complaints:  Hypothyroid  Stable on levothyroxine. Lab Results  Component Value Date   TSH 4.66 (H) 09/28/2019    COVID 19 screen No recent travel or known exposure to [place] The patient denies respiratory symptoms of COVID 19 at this time.  The importance of social distancing was discussed today.   Review of Systems  Constitutional: Negative for chills and fever.  HENT:  Negative for congestion and ear pain.   Eyes: Negative for pain and redness.  Respiratory: Negative for cough and shortness of breath.   Cardiovascular: Negative for chest pain, palpitations and leg swelling.  Gastrointestinal: Negative for abdominal pain, blood in stool, constipation, diarrhea, nausea and vomiting.  Genitourinary: Negative for dysuria.  Musculoskeletal: Negative for falls and myalgias.  Skin: Negative for rash.  Neurological: Negative for dizziness.  Psychiatric/Behavioral: Negative for depression. The patient is not nervous/anxious.       Past Medical History:  Diagnosis Date  . Allergy   . Anxiety   . Arthritis   . CAD (coronary artery disease)    non-obstructive. cath 11/11  . Diabetes mellitus    borderline diabetic - diet controlled   . History of chicken pox   . History of phobia    clastrophobia  . Hyperlipidemia   . Hypertension   . Hypothyroidism   . Kidney stones    hx of   . Lichen sclerosus   . Psoriasis   . Urinary tract infection    hx of     reports that she has never smoked. She has never used smokeless tobacco. She reports current alcohol use. She reports that she does not use drugs.   Current Outpatient Medications:  .  ACCU-CHEK COMPACT PLUS test strip, USE AS DIRECTED TO TEST BLOOD SUGAR DAILY, Disp: 100 each, Rfl: 1 .  aspirin EC 81 MG tablet, Take 81 mg by mouth daily., Disp: , Rfl:  .  Blood Glucose Monitoring Suppl (ACCU-CHEK COMPACT CARE KIT) KIT, Meter Kit, Disp: 1 each, Rfl: 0 .  clobetasol ointment (TEMOVATE) 9.48 %, Apply 1 application topically daily as needed (psoriasis flares)., Disp: , Rfl:  .  diphenhydrAMINE (BENADRYL) 25 MG tablet, Take 1 tablet (25 mg total) by mouth at bedtime as needed for sleep., Disp: 30 tablet, Rfl: 0 .  Emollient (CERAVE) CREA, Apply 1 application topically daily as needed (psoriasis). , Disp: , Rfl:  .  ezetimibe (ZETIA) 10 MG tablet, Take 1 tablet (10 mg total) by mouth daily., Disp: 90 tablet,  Rfl: 1 .  gemfibrozil (LOPID) 600 MG tablet, Take 1 tablet (600 mg total) by mouth every morning., Disp: 90 tablet, Rfl: 1 .  glipiZIDE (GLUCOTROL XL) 5 MG 24 hr tablet, TAKE 1 TABLET BY MOUTH EVERY DAY WITH BREAKFAST, Disp: 90 tablet, Rfl: 3 .  ibuprofen (ADVIL,MOTRIN) 200 MG tablet, Take 200 mg by mouth every 6 (six) hours as needed for moderate pain. , Disp: , Rfl:  .  levothyroxine (SYNTHROID, LEVOTHROID) 75 MCG tablet, TAKE 1 TABLET DAILY, Disp: 90 tablet, Rfl: 1 .  losartan (COZAAR) 100 MG tablet, Take 0.5 tablets (50 mg total) by mouth daily., Disp: 45 tablet, Rfl: 1 .  Multiple Vitamin (MULTIVITAMIN) tablet, Take 1 tablet by mouth every other day. , Disp: , Rfl:  .  nitroGLYCERIN (NITROSTAT) 0.4 MG SL tablet, Place 1 tablet (0.4 mg total) under the tongue every 5 (five) minutes as needed., Disp:  30 tablet, Rfl: 6 .  triamcinolone cream (KENALOG) 0.1 %, APPLY TO AFFECTED AREAS ON BODY TWICE DAILY TO AFFECTED AREAS AS NEEDED, Disp: , Rfl:  .  Vitamin D, Cholecalciferol, 1000 units TABS, Take 2,000 Units by mouth every other day. , Disp: , Rfl:    Observations/Objective: Blood pressure 140/72, pulse 87, temperature 98.1 F (36.7 C), temperature source Temporal, height '5\' 3"'$  (1.6 m), weight 156 lb 8 oz (71 kg), SpO2 96 %.  Physical Exam Constitutional:      General: She is not in acute distress.    Appearance: Normal appearance. She is well-developed. She is not ill-appearing or toxic-appearing.  HENT:     Head: Normocephalic.     Right Ear: Hearing, tympanic membrane, ear canal and external ear normal. No middle ear effusion.     Left Ear: Hearing, ear canal and external ear normal. A middle ear effusion is present.     Nose: Nose normal.  Eyes:     General: Lids are normal. Lids are everted, no foreign bodies appreciated.     Conjunctiva/sclera: Conjunctivae normal.     Pupils: Pupils are equal, round, and reactive to light.  Neck:     Musculoskeletal: Normal range of motion and  neck supple.     Thyroid: No thyroid mass or thyromegaly.     Vascular: No carotid bruit.     Trachea: Trachea normal.  Cardiovascular:     Rate and Rhythm: Normal rate and regular rhythm.     Heart sounds: Normal heart sounds, S1 normal and S2 normal. No murmur. No gallop.   Pulmonary:     Effort: Pulmonary effort is normal. No respiratory distress.     Breath sounds: Normal breath sounds. No wheezing, rhonchi or rales.  Abdominal:     General: Bowel sounds are normal. There is no distension or abdominal bruit.     Palpations: Abdomen is soft. There is no fluid wave or mass.     Tenderness: There is no abdominal tenderness. There is no guarding or rebound.     Hernia: No hernia is present.  Musculoskeletal:     Cervical back: She exhibits decreased range of motion. She exhibits no tenderness and no bony tenderness.  Lymphadenopathy:     Cervical: No cervical adenopathy.  Skin:    General: Skin is warm and dry.     Findings: No rash.  Neurological:     Mental Status: She is alert.     Cranial Nerves: No cranial nerve deficit.     Sensory: No sensory deficit.  Psychiatric:        Mood and Affect: Mood is not anxious or depressed.        Speech: Speech normal.        Behavior: Behavior normal. Behavior is cooperative.        Judgment: Judgment normal.     Diabetic foot exam: Normal inspection No skin breakdown No calluses  Normal DP pulses Normal sensation to light touch and monofilament Nails normal  Assessment and Plan   The patient's preventative maintenance and recommended screening tests for an annual wellness exam were reviewed in full today. Brought up to date unless services declined.  Counselled on the importance of diet, exercise, and its role in overall health and mortality. The patient's FH and SH was reviewed, including their home life, tobacco status, and drug and alcohol status.   Vaccines:Flu already given.Shingles - postponed PNA vaccines  -uptodate Foot exam - DONE today Colon cancer  screening - colonoscopy5/2018, plan repeat in 5 years Mammo:11/2018 Bone density - 11/2017 mild osteopenia in spine, repeat  In 5 year Hep C screening - done nonsmoker  Essential hypertension Well controlled. Continue current medication.   Diabetes mellitus without complication ([place]) Get back on track with low carb diet. Continue glucotrol. Offered med change.. pt not interested.  Hypercholesteremia At goal on lopid and zetia.  Adult hypothyroidism  Stable on levothyroxine.  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  Eval with X-ray.  Dysphagia Start PPI. Pt request referral to GI fpor further eval.  Psoriasis Per pt DErm concerned some of intermittent joint pain from psoriasis and recommends referral to rheum for work up.  Sinus pressure Treat with trial of flonse.    Eliezer Lofts, MD

## 2019-10-05 NOTE — Assessment & Plan Note (Signed)
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  Eval with X-ray.

## 2019-10-05 NOTE — Assessment & Plan Note (Signed)
Stable on levothyroxine.

## 2019-10-05 NOTE — Assessment & Plan Note (Signed)
Start PPI. Pt request referral to GI fpor further eval.

## 2019-10-05 NOTE — Assessment & Plan Note (Signed)
Treat with trial of flonse.

## 2019-10-05 NOTE — Assessment & Plan Note (Signed)
At goal on lopid and zetia. 

## 2019-10-05 NOTE — Assessment & Plan Note (Signed)
Get back on track with low carb diet. Continue glucotrol. Offered med change.. pt not interested.

## 2019-10-05 NOTE — Assessment & Plan Note (Signed)
Per pt DErm concerned some of intermittent joint pain from psoriasis and recommends referral to rheum for work up.

## 2019-10-08 ENCOUNTER — Other Ambulatory Visit: Payer: Self-pay | Admitting: Family Medicine

## 2019-10-08 MED ORDER — CLOBETASOL PROPIONATE 0.05 % EX OINT: 1.0000 "application " | TOPICAL_OINTMENT | Freq: Every day | CUTANEOUS | 1 refills | Status: DC | PRN

## 2019-10-08 MED ORDER — EZETIMIBE 10 MG PO TABS: 10.0000 mg | ORAL_TABLET | Freq: Every day | ORAL | 3 refills | Status: DC

## 2019-10-08 NOTE — Telephone Encounter (Signed)
Spoke with Renee [name].  Her refills on Zetia and Clobatesol ointment needs to be sent to [facility].  Refills resent to correct pharmacy.  I called and left voicemail at [facility] cancelling the refills that what sent there.

## 2019-10-08 NOTE — Telephone Encounter (Signed)
Last office visit 10/05/2019 for [place].  Ezetimibe was refilled at that appointment.  Clobetasol last refilled ?  Listed as historical medication for psoriasis flares.  Ok to refill?

## 2019-10-08 NOTE — Telephone Encounter (Signed)
° ° °  Patient stated she is needing these prescriptions sent to a different pharmacy, not the [facility].     EZETIMIBE 10 mg CLOBETASOL 0.05%    Renee [name]

## 2019-10-08 NOTE — Addendum Note (Signed)
Addended by: Carter Kitten on: 10/08/2019 03:18 PM   Modules accepted: Orders

## 2019-10-10 DIAGNOSIS — M3501 Sicca syndrome with keratoconjunctivitis: Secondary | ICD-10-CM | POA: Diagnosis not present

## 2019-10-29 DIAGNOSIS — M159 Polyosteoarthritis, unspecified: Secondary | ICD-10-CM | POA: Diagnosis not present

## 2019-10-29 DIAGNOSIS — L409 Psoriasis, unspecified: Secondary | ICD-10-CM | POA: Diagnosis not present

## 2019-10-29 DIAGNOSIS — M47812 Spondylosis without myelopathy or radiculopathy, cervical region: Secondary | ICD-10-CM | POA: Diagnosis not present

## 2019-10-29 DIAGNOSIS — M2550 Pain in unspecified joint: Secondary | ICD-10-CM | POA: Diagnosis not present

## 2019-11-29 ENCOUNTER — Other Ambulatory Visit: Payer: Self-pay

## 2019-11-29 ENCOUNTER — Encounter: Payer: Self-pay | Admitting: Gastroenterology

## 2019-11-29 ENCOUNTER — Ambulatory Visit (INDEPENDENT_AMBULATORY_CARE_PROVIDER_SITE_OTHER): Payer: Medicare Other | Admitting: Gastroenterology

## 2019-11-29 ENCOUNTER — Encounter (INDEPENDENT_AMBULATORY_CARE_PROVIDER_SITE_OTHER): Payer: Self-pay

## 2019-11-29 VITALS — BP 169/88 | HR 75 | Temp 98.1°F | Ht 63.0 in | Wt 154.8 lb

## 2019-11-29 DIAGNOSIS — R1310 Dysphagia, unspecified: Secondary | ICD-10-CM

## 2019-11-29 MED ORDER — OMEPRAZOLE 40 MG PO CPDR: 40.0000 mg | DELAYED_RELEASE_CAPSULE | Freq: Every day | ORAL | 1 refills | Status: DC

## 2019-11-29 NOTE — Progress Notes (Signed)
[name] MD, MRCP(U.K) [place]  [place], [place] [address]  Main: [phone]  Fax: [phone]   Gastroenterology Consultation  Referring Provider:     Jinny Sanders, MD Primary Care Physician:  Jinny Sanders, MD Primary Gastroenterologist:  Dr. [name]  Reason for Consultation:     Dysphagia        HPI:   Renee [name] is a 73 y.o. y/o female referred for consultation & management  by Dr. Diona Browner, Amy E, MD.    She has been referred for dysphagia she has a history of sicca syndrome.  She is accompanied by her daughter who is an ex-GI nurse.  She suffers from a dry mouth.  Has had issues with swallowing and food getting stuck in her middle of her chest for the past few months.  Gradually got worse.  She also suffers from acid reflux.  Has taken Protonix in the past which has helped with the heartburn.  Not taking any presently.  No weight loss.  She has false teeth.  Uses her teeth all the time when she eats.  Denies any asthma.  Not a smoker.  No recent endoscopy.  Dysphagia more significant for solids than for liquids.  Past Medical History:  Diagnosis Date  . Allergy   . Anxiety   . Arthritis   . CAD (coronary artery disease)    non-obstructive. cath 11/11  . Diabetes mellitus    borderline diabetic - diet controlled   . History of chicken pox   . History of phobia    clastrophobia  . Hyperlipidemia   . Hypertension   . Hypothyroidism   . Kidney stones    hx of   . Lichen sclerosus   . Psoriasis   . Urinary tract infection    hx of     Past Surgical History:  Procedure Laterality Date  . APPENDECTOMY    . CARDIAC CATHETERIZATION     2011  . CARPAL TUNNEL RELEASE     Left trigger finger  . CATARACT EXTRACTION W/PHACO Left 06/06/2018   Procedure: CATARACT EXTRACTION PHACO AND INTRAOCULAR LENS PLACEMENT (IOC);  Surgeon: Birder Robson, MD;  Location: [facility] ORS;  Service: Ophthalmology;  Laterality: Left;  [place]  00:23 AP% 16.0 CDE  3.69 Fluid pack lot # [id number]  . CATARACT EXTRACTION W/PHACO Right 09/19/2018   Procedure: CATARACT EXTRACTION PHACO AND INTRAOCULAR LENS PLACEMENT (IOC);  Surgeon: Birder Robson, MD;  Location: [facility] ORS;  Service: Ophthalmology;  Laterality: Right;  [place] 00:29.5 AP% 17.0 CDE$ 5.03 Fluid pack lot # [id number]  . COLONOSCOPY WITH PROPOFOL N/A 05/13/2017   Procedure: COLONOSCOPY WITH PROPOFOL;  Surgeon: Manya Silvas, MD;  Location: [facility] ENDOSCOPY;  Service: Endoscopy;  Laterality: N/A;  . EYE SURGERY  2004   lasik - right eye   . JOINT REPLACEMENT    . KNEE SURGERY Right 2015   arthroscopy  . TOTAL KNEE ARTHROPLASTY Bilateral 06/12/2015   Procedure: TOTAL KNEE BILATERAL;  Surgeon: Gaynelle Arabian, MD;  Location: [facility] ORS;  Service: Orthopedics;  Laterality: Bilateral;  and epidural  . TUBAL LIGATION    . Tubes tied     and untied. and tied again    Prior to Admission medications   Medication Sig Start Date End Date Taking? Authorizing Provider  ACCU-CHEK COMPACT PLUS test strip USE AS DIRECTED TO TEST BLOOD SUGAR DAILY 10/27/18   Diona Browner, Amy E, MD  aspirin EC 81 MG tablet Take 81  mg by mouth daily.    [provider]  Blood Glucose Monitoring Suppl (ACCU-CHEK COMPACT CARE KIT) KIT Meter Kit 01/29/16   Margarita Rana, MD  clobetasol ointment (TEMOVATE) 5.09 % Apply 1 application topically daily as needed (psoriasis flares). 10/08/19   Bedsole, Amy E, MD  diphenhydrAMINE (BENADRYL) 25 MG tablet Take 1 tablet (25 mg total) by mouth at bedtime as needed for sleep. 06/17/15   Perkins, Alexzandrew L, PA-C  Emollient (CERAVE) CREA Apply 1 application topically daily as needed (psoriasis).     [provider]  ezetimibe (ZETIA) 10 MG tablet Take 1 tablet (10 mg total) by mouth daily. 10/08/19   Bedsole, Amy E, MD  gemfibrozil (LOPID) 600 MG tablet Take 1 tablet (600 mg total) by mouth every morning. 10/05/19   Bedsole, Amy E, MD  glipiZIDE (GLUCOTROL XL) 5 MG 24 hr tablet TAKE 1  TABLET BY MOUTH EVERY DAY WITH BREAKFAST 08/16/19   Bedsole, Amy E, MD  ibuprofen (ADVIL,MOTRIN) 200 MG tablet Take 200 mg by mouth every 6 (six) hours as needed for moderate pain.     [provider]  levothyroxine (SYNTHROID) 75 MCG tablet TAKE 1 TABLET DAILY 10/05/19   Bedsole, Amy E, MD  losartan (COZAAR) 100 MG tablet Take 0.5 tablets (50 mg total) by mouth daily. 10/05/19   Bedsole, Amy E, MD  Multiple Vitamin (MULTIVITAMIN) tablet Take 1 tablet by mouth every other day.     [provider]  nitroGLYCERIN (NITROSTAT) 0.4 MG SL tablet Place 1 tablet (0.4 mg total) under the tongue every 5 (five) minutes as needed. 09/10/13   Minna Merritts, MD  triamcinolone cream (KENALOG) 0.1 % APPLY TO AFFECTED AREAS ON BODY TWICE DAILY TO AFFECTED AREAS AS NEEDED 08/28/19   [provider]  Vitamin D, Cholecalciferol, 1000 units TABS Take 2,000 Units by mouth every other day.     [provider]    Family History  Problem Relation Age of Onset  . COPD Mother   . Diabetes Mother   . Hypertension Mother   . Hypertension Father   . Diabetes Sister   . Hypertension Sister   . Diabetes Brother   . Arthritis Brother   . Heart disease Brother   . Arthritis Brother   . Diabetes Brother   . Heart disease Brother   . Stomach cancer Maternal Grandfather   . Stroke Maternal Grandfather   . Breast cancer Daughter 28       Double mastectomy     Social History   Tobacco Use  . Smoking status: Never Smoker  . Smokeless tobacco: Never Used  Substance Use Topics  . Alcohol use: Yes    Alcohol/week: 0.0 standard drinks    Comment: occasionally  . Drug use: No    Allergies as of 11/29/2019 - Review Complete 10/05/2019  Allergen Reaction Noted  . Other Rash 06/06/2015  . Codeine Nausea And Vomiting   . Metformin Nausea And Vomiting   . Robaxin [methocarbamol] Nausea Only 10/20/2015  . Simvastatin Swelling and Rash     Review of Systems:    All systems reviewed  and negative except where noted in HPI.   Physical Exam:  There were no vitals taken for this visit. No LMP recorded. Patient is postmenopausal. Psych:  Alert and cooperative. Normal mood and affect. General:   Alert,  Well-developed, well-nourished, pleasant and cooperative in NAD Head:  Normocephalic and atraumatic. Eyes:  Sclera clear, no icterus.   Conjunctiva pink.  Ears:  Normal auditory acuity. Nose:  No deformity, discharge, or lesions. Mouth:  No deformity or lesions,oropharynx pink & moist. Neck:  Supple; no masses or thyromegaly. Lungs:  Respirations even and unlabored.  Clear throughout to auscultation.   No wheezes, crackles, or rhonchi. No acute distress. Heart:  Regular rate and rhythm; no murmurs, clicks, rubs, or gallops. Neurologic:  Alert and oriented x3;  grossly normal neurologically. Skin:  Intact without significant lesions or rashes. No jaundice. Lymph Nodes:  No significant cervical adenopathy. Psych:  Alert and cooperative. Normal mood and affect.  Imaging Studies: No results found.  Assessment and Plan:   Renee [name] is a 73 y.o. y/o female has been referred for dysphagia.  Very likely secondary to sicca syndrome with a dry mouth.  Reiterated the fact that she needs to sip on liquids while eating to help lubricate the food.  It is also possible that acid reflux has a role to play.  I will commence her on Prilosec 40 mg once a day before her breakfast to empirically treat any acid reflux.  In addition we should proceed with a endoscopy to rule out any strictures.  She has some chest discomfort which was nonspecific not related to clear strenuous activity or to diet.  She has a known cardiologist and hence will request a cardiology clearance prior to the procedure.   I have discussed alternative options, risks & benefits,  which include, but are not limited to, bleeding, infection, perforation,respiratory complication & drug reaction.  The patient agrees with  this plan & written consent will be obtained.        Follow up in 8 weeks  Dr [name] MD,MRCP(U.K)

## 2019-12-07 NOTE — Progress Notes (Signed)
Cardiology Office Note  Date:  12/10/2019   ID:  Renee [name], Renee [name] 07/25/1946, MRN [id number]  PCP:  Jinny Sanders, MD   Chief Complaint  Patient presents with  . office visit    12 mo F/U; Meds verbally reviewed with patient.    HPI:  Renee [name] is a very pleasant 73 year old woman with a history of  hypertension,  hyperlipidemia,  long history of diabetes.  Cardiac catheterization October 2011 for chest pain showed mild to moderate non-obstructive CAD. 2010  With  EF of 55% to 60%.   40-50% LAD disease, 60% ostial diagonal disease, 40% PDA History of 2 knee replacements  in [place] She presents today for follow-up of her coronary artery disease  Reports having several symptoms, Dry eyes, difficulty swallowing Scheduled for EGD Dry food sticking possible sicca syndrome  Needs endoscopy to rule out any strictures. Has been seen by GI  Labs from 09/2019 reviewed HAB1C 7.9 up from 7.2 Total chol 142 on zetia Previously unable to tolerate statins  on losartan Usually BP 127 to 517 systolic at home Mildly elevated today after long walk into the office  EKG personally reviewed by myself on todays visit Shows normal sinus rhythm with rate 79bpm no significant ST or T wave changes  Previous significant reaction to statins, simvastatin may have caused a rash.     PMH:   has a past medical history of Allergy, Anxiety, Arthritis, CAD (coronary artery disease), Diabetes mellitus, History of chicken pox, History of phobia, Hyperlipidemia, Hypertension, Hypothyroidism, Kidney stones, Lichen sclerosus, Psoriasis, and Urinary tract infection.  PSH:    Past Surgical History:  Procedure Laterality Date  . APPENDECTOMY    . CARDIAC CATHETERIZATION     2011  . CARPAL TUNNEL RELEASE     Left trigger finger  . CATARACT EXTRACTION W/PHACO Left 06/06/2018   Procedure: CATARACT EXTRACTION PHACO AND INTRAOCULAR LENS PLACEMENT (IOC);  Surgeon: Birder Robson, MD;  Location: [facility]  ORS;  Service: Ophthalmology;  Laterality: Left;  [place]  00:23 AP% 16.0 CDE 3.69 Fluid pack lot # [id number]  . CATARACT EXTRACTION W/PHACO Right 09/19/2018   Procedure: CATARACT EXTRACTION PHACO AND INTRAOCULAR LENS PLACEMENT (IOC);  Surgeon: Birder Robson, MD;  Location: [facility] ORS;  Service: Ophthalmology;  Laterality: Right;  [place] 00:29.5 AP% 17.0 CDE$ 5.03 Fluid pack lot # [id number]  . COLONOSCOPY WITH PROPOFOL N/A 05/13/2017   Procedure: COLONOSCOPY WITH PROPOFOL;  Surgeon: Manya Silvas, MD;  Location: [facility] ENDOSCOPY;  Service: Endoscopy;  Laterality: N/A;  . EYE SURGERY  2004   lasik - right eye   . JOINT REPLACEMENT    . KNEE SURGERY Right 2015   arthroscopy  . TOTAL KNEE ARTHROPLASTY Bilateral 06/12/2015   Procedure: TOTAL KNEE BILATERAL;  Surgeon: Gaynelle Arabian, MD;  Location: [facility] ORS;  Service: Orthopedics;  Laterality: Bilateral;  and epidural  . TUBAL LIGATION    . Tubes tied     and untied. and tied again    Current Outpatient Medications  Medication Sig Dispense Refill  . ACCU-CHEK COMPACT PLUS test strip USE AS DIRECTED TO TEST BLOOD SUGAR DAILY 100 each 1  . acetaminophen (TYLENOL) 650 MG CR tablet One tab as needed every 8 hours, 30 days    . aspirin EC 81 MG tablet Take 81 mg by mouth daily.    . Blood Glucose Monitoring Suppl (ACCU-CHEK COMPACT CARE KIT) KIT Meter Kit 1 each 0  . clobetasol ointment (TEMOVATE) 6.38 % Apply 1 application topically  daily as needed (psoriasis flares). 30 g 1  . diphenhydrAMINE (BENADRYL) 25 MG tablet Take 1 tablet (25 mg total) by mouth at bedtime as needed for sleep. 30 tablet 0  . Emollient (CERAVE) CREA Apply 1 application topically daily as needed (psoriasis).     . ezetimibe (ZETIA) 10 MG tablet Take 1 tablet (10 mg total) by mouth daily. 90 tablet 3  . gemfibrozil (LOPID) 600 MG tablet Take 1 tablet (600 mg total) by mouth every morning. 90 tablet 3  . glipiZIDE (GLUCOTROL XL) 5 MG 24 hr tablet TAKE 1 TABLET BY MOUTH EVERY DAY WITH  BREAKFAST 90 tablet 3  . ibuprofen (ADVIL,MOTRIN) 200 MG tablet Take 200 mg by mouth every 6 (six) hours as needed for moderate pain.     Marland Kitchen levothyroxine (SYNTHROID) 75 MCG tablet TAKE 1 TABLET DAILY 90 tablet 3  . losartan (COZAAR) 50 MG tablet Take 50 mg by mouth daily.    . Multiple Vitamin (MULTIVITAMIN) tablet Take 1 tablet by mouth every other day.     . nitroGLYCERIN (NITROSTAT) 0.4 MG SL tablet Place 1 tablet (0.4 mg total) under the tongue every 5 (five) minutes as needed. 30 tablet 6  . omeprazole (PRILOSEC) 40 MG capsule Take 1 capsule (40 mg total) by mouth daily. 90 capsule 1  . triamcinolone cream (KENALOG) 0.1 % APPLY TO AFFECTED AREAS ON BODY TWICE DAILY TO AFFECTED AREAS AS NEEDED    . Vitamin D, Cholecalciferol, 1000 units TABS Take 2,000 Units by mouth every other day.      No current facility-administered medications for this visit.     Allergies:   Other, Codeine, Metformin, Robaxin [methocarbamol], and Simvastatin   Social History:  The patient  reports that she has never smoked. She has never used smokeless tobacco. She reports current alcohol use. She reports that she does not use drugs.   Family History:   family history includes Arthritis in her brother and brother; Breast cancer (age of onset: 31) in her daughter; COPD in her mother; Diabetes in her brother, brother, mother, and sister; Heart disease in her brother and brother; Hypertension in her father, mother, and sister; Stomach cancer in her maternal grandfather; Stroke in her maternal grandfather.    Review of Systems: Review of Systems  Constitutional: Negative.   Respiratory: Negative.   Cardiovascular: Negative.   Gastrointestinal: Negative.   Musculoskeletal: Negative.   Neurological: Negative.   Psychiatric/Behavioral: Negative.   All other systems reviewed and are negative.   PHYSICAL EXAM: VS:  BP (!) 150/84 (BP Location: Left Arm, Patient Position: Sitting, Cuff Size: Normal)   Pulse 79    Temp (!) 97.1 F (36.2 C)   Ht _0  (1.626 m)   Wt 154 lb 8 oz (70.1 kg)   SpO2 94%   BMI 26.52 kg/m  , BMI Body mass index is 26.52 kg/m.   Recent Labs: 09/28/2019: ALT 23; BUN 23; Creatinine, Ser 0.74; Potassium 4.1; Sodium 143; TSH 4.66    Lipid Panel Lab Results  Component Value Date   CHOL 142 09/28/2019   HDL 38.10 (L) 09/28/2019   LDLCALC 88 09/28/2019   TRIG 77.0 09/28/2019      Wt Readings from Last 3 Encounters:  12/10/19 154 lb 8 oz (70.1 kg)  11/29/19 154 lb 12.8 oz (70.2 kg)  10/05/19 156 lb 8 oz (71 kg)      ASSESSMENT AND PLAN:  Essential hypertension - Plan: EKG 12-Lead She will monitor BP at home  typically 130 to 140 at home  Atherosclerosis of native coronary artery of native heart without angina pectoris - Plan: EKG 12-Lead Currently with no symptoms of angina. No further workup at this time. Continue current medication regimen.  Hypercholesteremia Statin intolerance, continue Zetia stable   Type 2 diabetes mellitus with complication, unspecified long term insulin use status ([place]) Numbers  Higher Diet discussed   Total encounter time more than 25 minutes  Greater than 50% was spent in counseling and coordination of care with the patient   Disposition:   F/U  12 months   No orders of the defined types were placed in this encounter.    Signed, Esmond Plants, M.D., Ph.D. 12/10/2019  [place], [place]

## 2019-12-10 ENCOUNTER — Other Ambulatory Visit: Payer: Self-pay

## 2019-12-10 ENCOUNTER — Encounter: Payer: Self-pay | Admitting: Cardiovascular Disease

## 2019-12-10 ENCOUNTER — Ambulatory Visit (INDEPENDENT_AMBULATORY_CARE_PROVIDER_SITE_OTHER): Payer: Medicare Other | Admitting: Cardiovascular Disease

## 2019-12-10 VITALS — BP 150/84 | HR 79 | Temp 97.1°F | Ht 64.0 in | Wt 154.5 lb

## 2019-12-10 DIAGNOSIS — E7800 Pure hypercholesterolemia, unspecified: Secondary | ICD-10-CM | POA: Diagnosis not present

## 2019-12-10 DIAGNOSIS — E119 Type 2 diabetes mellitus without complications: Secondary | ICD-10-CM | POA: Diagnosis not present

## 2019-12-10 DIAGNOSIS — I10 Essential (primary) hypertension: Secondary | ICD-10-CM

## 2019-12-10 DIAGNOSIS — I25118 Atherosclerotic heart disease of native coronary artery with other forms of angina pectoris: Secondary | ICD-10-CM

## 2019-12-10 IMAGING — CT CT ABD-PELV W/ CM
2 of 5 series · 15 of 46 positions shown, 17 images · IV contrast (APPLIED)
Comparison: Prior CT scan of the abdomen and pelvis 06/10/2010

CLINICAL DATA: 71-year-old female with epigastric abdominal pain.

EXAM:
CT ABDOMEN AND PELVIS WITH CONTRAST
TECHNIQUE: Multidetector CT imaging of the abdomen and pelvis was performed
using the standard protocol following bolus administration of
intravenous contrast.
CONTRAST:  80mL DSWH2E-CLL IOPAMIDOL (DSWH2E-CLL) INJECTION 61%

[Series 3: abdomen 5.0 · axial · 0.76mm/px · z∈[+836,+1226]mm · 12 of 92 slices shown, 14 images]
[im 7/92  soft-tissue]
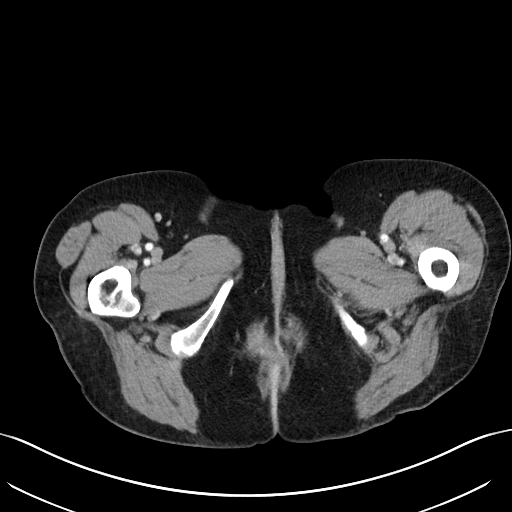
[im 7/92  bone]
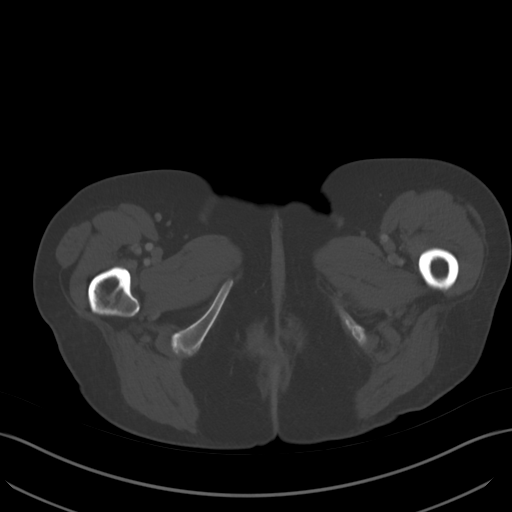
[im 13/92  soft-tissue]
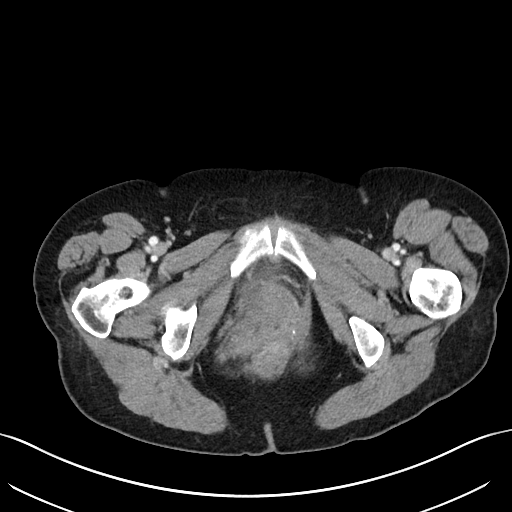
[im 19/92  soft-tissue]
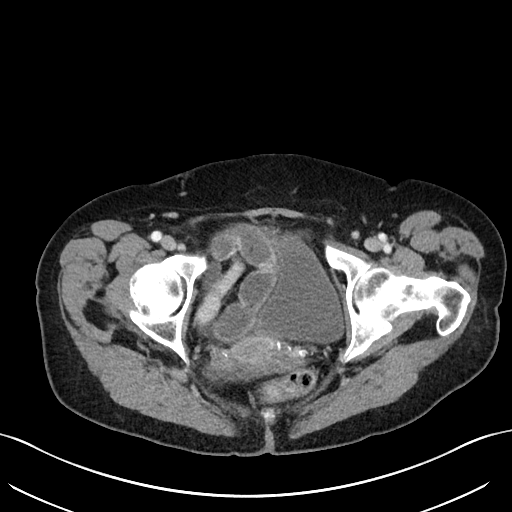
[im 31/92  soft-tissue]
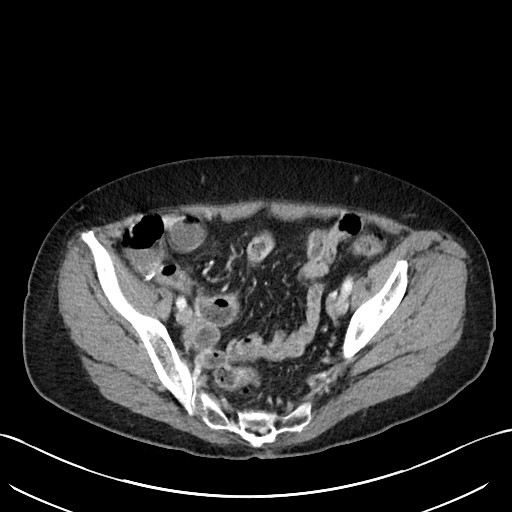
[im 37/92  soft-tissue]
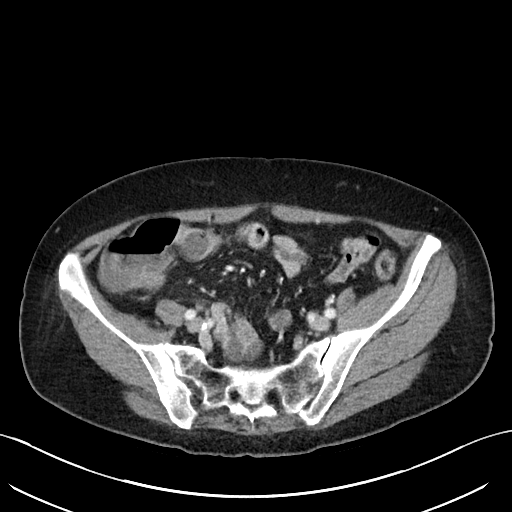
[im 43/92  soft-tissue]
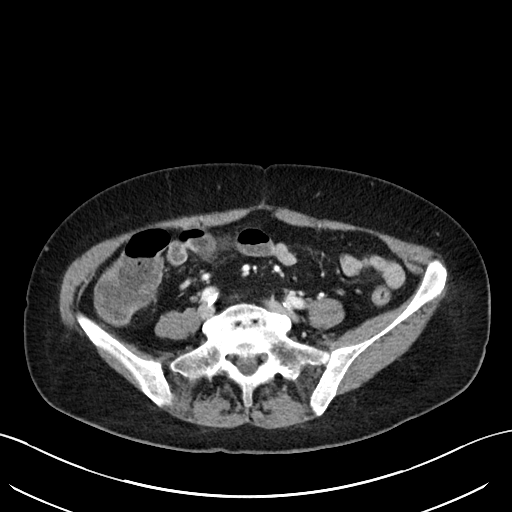
[im 49/92  soft-tissue]
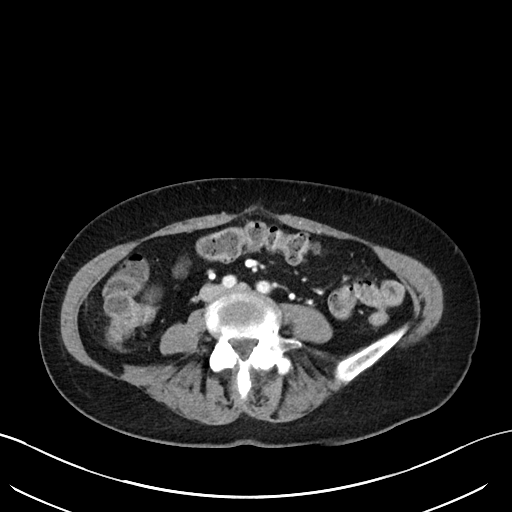
[im 55/92  soft-tissue]
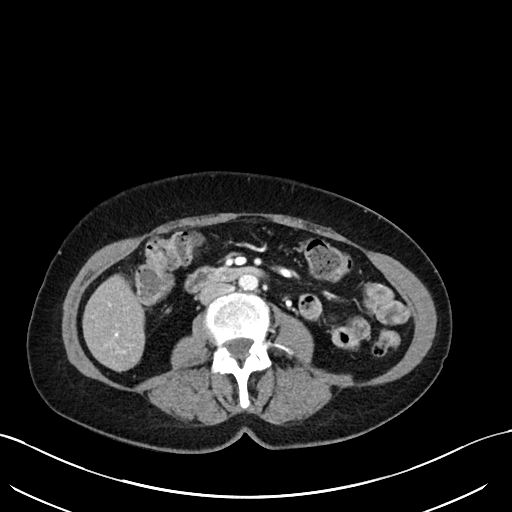
[im 61/92  soft-tissue]
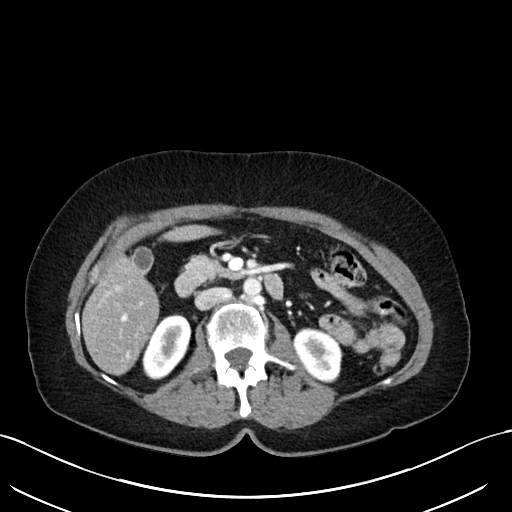
[im 61/92  bone]
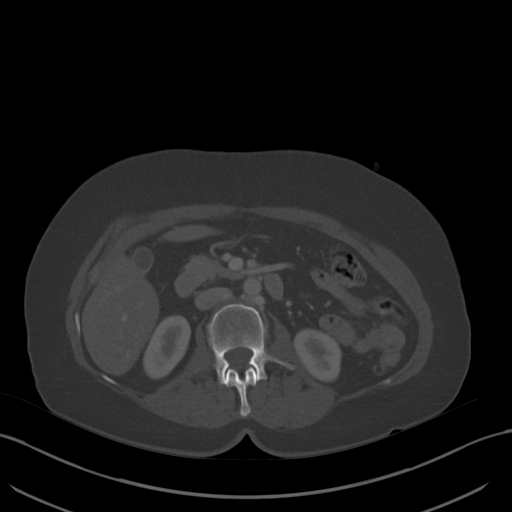
[im 73/92  soft-tissue]
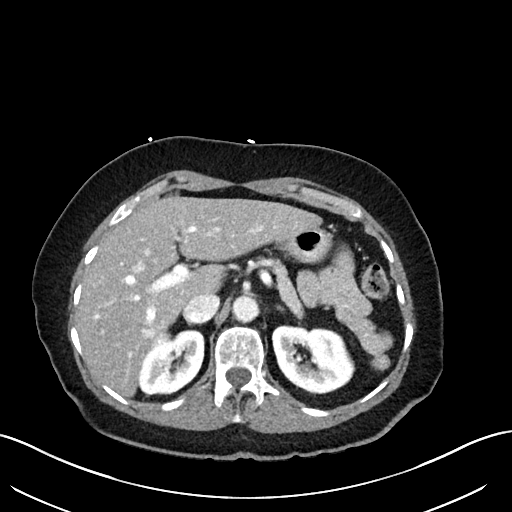
[im 79/92  soft-tissue]
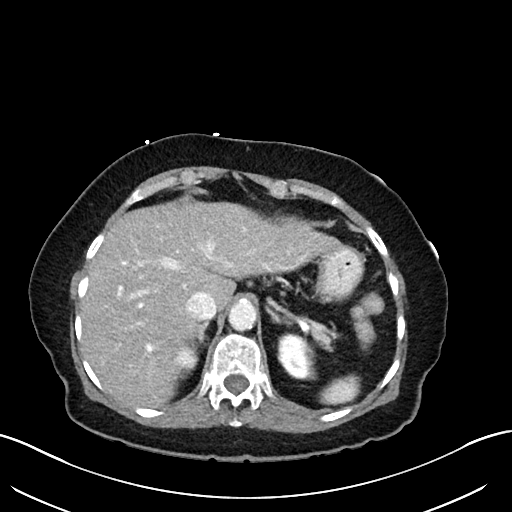
[im 85/92  soft-tissue]
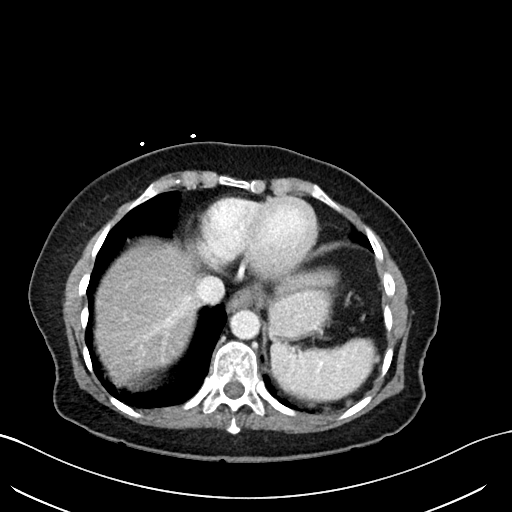

[Series 6: abdomen 3.0 mpr cor · coronal · 0.74mm/px · 3 of 80 slices shown]
[im 27/80  soft-tissue]
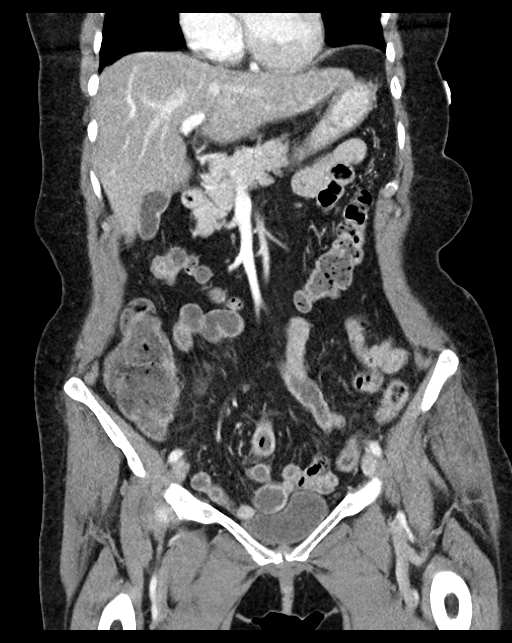
[im 36/80  soft-tissue]
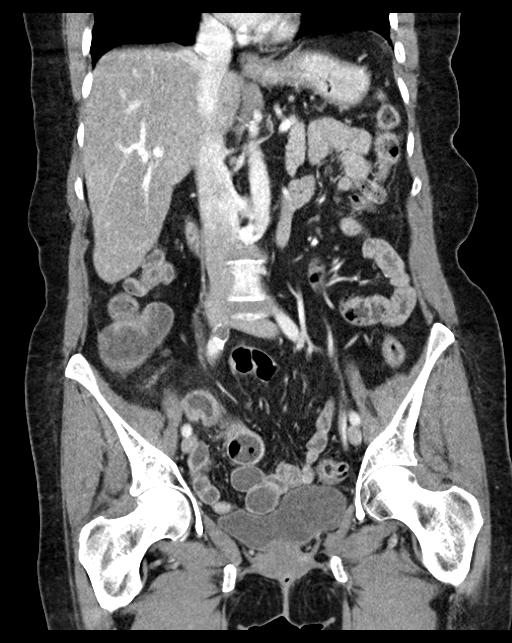
[im 44/80  soft-tissue]
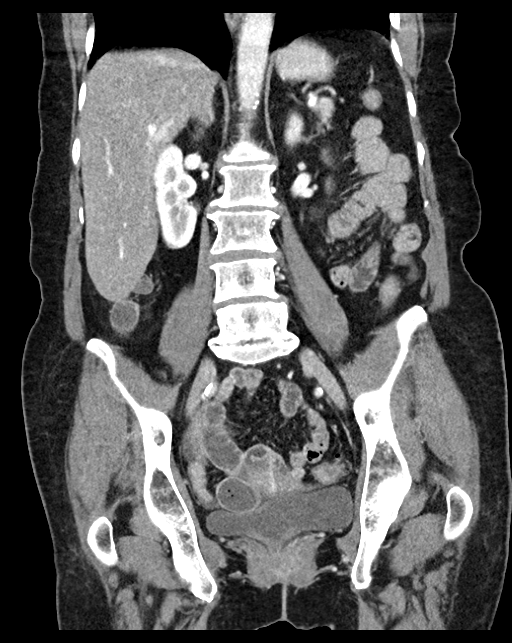

[15 of 46 positions shown; findings below may reference images not displayed]

FINDINGS: Lower chest: Marked respiratory motion artifacts limits evaluation
for small pulmonary nodules. Within this limitation, the lung bases
are essentially clear save for some trace atelectasis versus
scarring in the inferior most aspect of the lingula. The visualized
cardiac structures are within normal limits for size. Unremarkable
visualized distal thoracic esophagus.

Hepatobiliary: Diffuse low attenuation of the hepatic parenchyma
most consistent with hepatic steatosis. Normal hepatic contour and
morphology. No overt cirrhotic changes. No focal lesion. Gallbladder
is unremarkable. No intra or extrahepatic biliary ductal dilatation.

Pancreas: Unremarkable. No pancreatic ductal dilatation or
surrounding inflammatory changes.

Spleen: Normal in size without focal abnormality.

Adrenals/Urinary Tract: Adrenal glands are unremarkable. Kidneys are
normal, without renal calculi, focal lesion, or hydronephrosis.
Bladder is unremarkable.

Stomach/Bowel: Colonic diverticular disease without CT evidence of
active inflammation. The appendix is surgically absent. Unremarkable
terminal ileum. No focal bowel wall thickening or evidence of
obstruction.

Vascular/Lymphatic: Mild aortic atherosclerotic vascular
calcifications without evidence of aneurysm or dissection. No
significant stenosis or occlusive disease involving the visceral
vasculature to suggest the possibility of mesenteric ischemia.
Retroaortic left renal vein. No suspicious lymphadenopathy.

Reproductive: Uterus and bilateral adnexa are unremarkable.

Other: Small volume of free fluid is present within the recto
uterine cul-de-sac of [place]. The fluid is simple in character. No
enhancing nodularity identified.

Musculoskeletal: No acute fracture or aggressive appearing lytic or
blastic osseous lesion. L5-S1 degenerative disc disease.
IMPRESSION: 1. Although no definitive acute abnormality is identified within the
abdomen or pelvis, there is a small volume of simple free fluid in
the pelvic cul-de-sac which is abnormal in a postmenopausal age
female. This may be related to an underlying infectious or
inflammatory process of the bowel which is otherwise occult by
imaging.
2. Mild colonic diverticular disease without evidence of active
inflammation.
3.  Aortic Atherosclerosis (NFML4-170.0).
4. L5-S1 degenerative disc disease.

## 2019-12-10 NOTE — Patient Instructions (Signed)
Please monitor blood pressure Call with numbers  Numbers are elevated today   Medication Instructions:  No changes  If you need a refill on your cardiac medications before your next appointment, please call your pharmacy.    Lab work: No new labs needed   If you have labs (blood work) drawn today and your tests are completely normal, you will receive your results only by: Marland Kitchen MyChart Message (if you have MyChart) OR . A paper copy in the mail If you have any lab test that is abnormal or we need to change your treatment, we will call you to review the results.   Testing/Procedures: No new testing needed   Follow-Up: At [facility], you and your health needs are our priority.  As part of our continuing mission to provide you with exceptional heart care, we have created designated Provider Care Teams.  These Care Teams include your primary Cardiologist (physician) and Advanced Practice Providers (APPs -  Physician Assistants and Nurse Practitioners) who all work together to provide you with the care you need, when you need it.  . You will need a follow up appointment in 12 months   . Providers on your designated Care Team:   . Murray Hodgkins, NP . Christell Faith, PA-C . Marrianne Mood, PA-C  Any Other Special Instructions Will Be Listed Below (If Applicable).  For educational health videos Log in to : [email or web address] Or : [email or web address], password : [email or web address]

## 2019-12-11 ENCOUNTER — Telehealth: Payer: Self-pay

## 2019-12-11 NOTE — Telephone Encounter (Signed)
   [facility] Pre-operative Risk Assessment    Request for surgical clearance:  1. What type of surgery is being performed? Upper Endoscopy   2. When is this surgery scheduled? 01/04/20   3. What type of clearance is required (medical clearance vs. Pharmacy clearance to hold med vs. Both)? Medical  4. Are there any medications that need to be held prior to surgery and how long? None   5. Practice name and name of physician performing surgery?  [facility],  Dr. [name]   6. What is your office phone number [phone]    7.   What is your office fax number [phone]  8.   Anesthesia type (None, local, MAC, general) ? General   Renee [name] 12/11/2019, 10:13 AM  _________________________________________________________________   (provider comments below)

## 2019-12-11 NOTE — Telephone Encounter (Signed)
   Primary Cardiologist: Dr.Gollan  Chart reviewed as part of pre-operative protocol coverage.She has a history of hypertension, and hyperlipidemia. Had cardiac cath in 09/2010 revealing mild to moderate non-obstructive CAD. She was seen in the office on 12/10/2019 by Dr.Gollan and was stable from cardiac standpoint. No further cardiac testing was planned. Given past medical history and time since last visit, based on ACC/AHA guidelines, NECO VISCUSI would be at acceptable risk for the planned procedure without further cardiovascular testing.   I will route this recommendation to the requesting party via Epic fax function and remove from pre-op pool.  Please call with questions.  Phill Myron. Terel Bann DNP, ANP, AACC  12/11/2019, 10:24 AM

## 2020-01-01 ENCOUNTER — Other Ambulatory Visit
Admission: RE | Admit: 2020-01-01 | Discharge: 2020-01-01 | Disposition: A | Discharge status: Home / Self Care | Payer: Medicare Other | Source: Ambulatory Visit | Attending: Gastroenterology | Admitting: Gastroenterology

## 2020-01-01 ENCOUNTER — Other Ambulatory Visit: Payer: Self-pay

## 2020-01-01 DIAGNOSIS — Z20822 Contact with and (suspected) exposure to covid-19: Secondary | ICD-10-CM | POA: Diagnosis not present

## 2020-01-01 DIAGNOSIS — Z01812 Encounter for preprocedural laboratory examination: Secondary | ICD-10-CM | POA: Diagnosis not present

## 2020-01-02 ENCOUNTER — Telehealth: Payer: Self-pay | Admitting: Family Medicine

## 2020-01-02 LAB — SARS CORONAVIRUS 2 (TAT 6-24 HRS): SARS Coronavirus 2: NEGATIVE

## 2020-01-02 NOTE — Telephone Encounter (Signed)
Ms. Hollinger notified as instructed by telephone.

## 2020-01-02 NOTE — Telephone Encounter (Signed)
Patient is calling in regards to the covid vaccine. She stated closer to where she lives they are giving these out for 75+ She wanted to know if you think she should get the vaccine due to her age and health .    Patient stated she is having a procedure done at the hospital where she will be under anesthesia. And wanted to know if she should wait until after the procedure done   Please advise

## 2020-01-02 NOTE — Telephone Encounter (Signed)
I think she should get the vaccine and get it as soon as she is able, prior to procedure.

## 2020-01-04 ENCOUNTER — Encounter
Admission: RE | Disposition: A | Discharge status: Home / Self Care | Payer: Self-pay | Source: Ambulatory Visit | Attending: Gastroenterology

## 2020-01-04 ENCOUNTER — Ambulatory Visit: Payer: Medicare Other | Admitting: Anesthesiology

## 2020-01-04 ENCOUNTER — Other Ambulatory Visit: Payer: Self-pay

## 2020-01-04 ENCOUNTER — Encounter: Payer: Self-pay | Admitting: Gastroenterology

## 2020-01-04 ENCOUNTER — Ambulatory Visit
Admission: RE | Admit: 2020-01-04 | Discharge: 2020-01-04 | Disposition: A | Discharge status: Home / Self Care | Payer: Medicare Other | Source: Ambulatory Visit | Attending: Gastroenterology | Admitting: Gastroenterology

## 2020-01-04 DIAGNOSIS — Z6826 Body mass index (BMI) 26.0-26.9, adult: Secondary | ICD-10-CM | POA: Insufficient documentation

## 2020-01-04 DIAGNOSIS — E785 Hyperlipidemia, unspecified: Secondary | ICD-10-CM | POA: Diagnosis not present

## 2020-01-04 DIAGNOSIS — Z7982 Long term (current) use of aspirin: Secondary | ICD-10-CM | POA: Diagnosis not present

## 2020-01-04 DIAGNOSIS — Z7984 Long term (current) use of oral hypoglycemic drugs: Secondary | ICD-10-CM | POA: Insufficient documentation

## 2020-01-04 DIAGNOSIS — E039 Hypothyroidism, unspecified: Secondary | ICD-10-CM | POA: Diagnosis not present

## 2020-01-04 DIAGNOSIS — I10 Essential (primary) hypertension: Secondary | ICD-10-CM | POA: Insufficient documentation

## 2020-01-04 DIAGNOSIS — E669 Obesity, unspecified: Secondary | ICD-10-CM | POA: Insufficient documentation

## 2020-01-04 DIAGNOSIS — R1310 Dysphagia, unspecified: Secondary | ICD-10-CM | POA: Diagnosis not present

## 2020-01-04 DIAGNOSIS — F419 Anxiety disorder, unspecified: Secondary | ICD-10-CM | POA: Insufficient documentation

## 2020-01-04 DIAGNOSIS — Z833 Family history of diabetes mellitus: Secondary | ICD-10-CM | POA: Insufficient documentation

## 2020-01-04 DIAGNOSIS — I2510 Atherosclerotic heart disease of native coronary artery without angina pectoris: Secondary | ICD-10-CM | POA: Diagnosis not present

## 2020-01-04 DIAGNOSIS — M1990 Unspecified osteoarthritis, unspecified site: Secondary | ICD-10-CM | POA: Diagnosis not present

## 2020-01-04 DIAGNOSIS — Z13810 Encounter for screening for upper gastrointestinal disorder: Secondary | ICD-10-CM | POA: Diagnosis not present

## 2020-01-04 DIAGNOSIS — Z87442 Personal history of urinary calculi: Secondary | ICD-10-CM | POA: Insufficient documentation

## 2020-01-04 DIAGNOSIS — R7303 Prediabetes: Secondary | ICD-10-CM | POA: Insufficient documentation

## 2020-01-04 DIAGNOSIS — Z79899 Other long term (current) drug therapy: Secondary | ICD-10-CM | POA: Insufficient documentation

## 2020-01-04 DIAGNOSIS — Z79890 Hormone replacement therapy (postmenopausal): Secondary | ICD-10-CM | POA: Diagnosis not present

## 2020-01-04 DIAGNOSIS — L409 Psoriasis, unspecified: Secondary | ICD-10-CM | POA: Diagnosis not present

## 2020-01-04 DIAGNOSIS — E119 Type 2 diabetes mellitus without complications: Secondary | ICD-10-CM | POA: Diagnosis not present

## 2020-01-04 DIAGNOSIS — Z8249 Family history of ischemic heart disease and other diseases of the circulatory system: Secondary | ICD-10-CM | POA: Diagnosis not present

## 2020-01-04 HISTORY — PX: ESOPHAGOGASTRODUODENOSCOPY (EGD) WITH PROPOFOL: SHX5813

## 2020-01-04 SURGERY — ESOPHAGOGASTRODUODENOSCOPY (EGD) WITH PROPOFOL
Anesthesia: General

## 2020-01-04 MED ORDER — PROPOFOL 500 MG/50ML IV EMUL
INTRAVENOUS | Status: DC | PRN
  Administered 2020-01-04: 150 ug/kg/min via INTRAVENOUS

## 2020-01-04 MED ORDER — SODIUM CHLORIDE 0.9 % IV SOLN
INTRAVENOUS | Status: DC
  Administered 2020-01-04: 1000 mL via INTRAVENOUS

## 2020-01-04 MED ORDER — LIDOCAINE HCL (PF) 2 % IJ SOLN
INTRAMUSCULAR | Status: DC | PRN
  Administered 2020-01-04: 100 mg via INTRADERMAL

## 2020-01-04 MED ORDER — PROPOFOL 10 MG/ML IV BOLUS
INTRAVENOUS | Status: DC | PRN
  Administered 2020-01-04: 70 mg via INTRAVENOUS

## 2020-01-04 NOTE — Transfer of Care (Signed)
Immediate Anesthesia Transfer of Care Note  Patient: [name]  Procedure(s) Performed: ESOPHAGOGASTRODUODENOSCOPY (EGD) WITH PROPOFOL (N/A )  Patient Location: PACU  Anesthesia Type:General  Level of Consciousness: awake, alert  and oriented  Airway & Oxygen Therapy: Patient Spontanous Breathing and Patient connected to nasal cannula oxygen  Post-op Assessment: Report given to RN and Post -op Vital signs reviewed and stable  Post vital signs: Reviewed and stable  Last Vitals:  Vitals Value Taken Time  BP 154/88 01/04/20 1028  Temp 36.1 C 01/04/20 1027  Pulse 75 01/04/20 1032  Resp 18 01/04/20 1032  SpO2 98 % 01/04/20 1032  Vitals shown include unvalidated device data.  Last Pain:  Vitals:   01/04/20 1027  TempSrc: Temporal  PainSc: 0-No pain         Complications: No apparent anesthesia complications

## 2020-01-04 NOTE — Anesthesia Preprocedure Evaluation (Signed)
Anesthesia Evaluation  Patient identified by MRN, date of birth, ID band Patient awake    Reviewed: Allergy & Precautions, NPO status , Patient's Chart, lab work & pertinent test results  History of Anesthesia Complications Negative for: history of anesthetic complications  Airway Mallampati: II  TM Distance: >3 FB Neck ROM: Full    Dental  (+) Upper Dentures, Lower Dentures   Pulmonary neg pulmonary ROS, neg sleep apnea, neg COPD,    breath sounds clear to auscultation- rhonchi (-) wheezing      Cardiovascular hypertension, Pt. on medications + CAD (nonobstructive)  (-) Past MI, (-) Cardiac Stents and (-) CABG  Rhythm:Regular Rate:Normal - Systolic murmurs and - Diastolic murmurs    Neuro/Psych neg Seizures PSYCHIATRIC DISORDERS Anxiety negative neurological ROS     GI/Hepatic negative GI ROS, Neg liver ROS,   Endo/Other  diabetes, Oral Hypoglycemic AgentsHypothyroidism   Renal/GU Renal disease: hx of nephrolithiasis.     Musculoskeletal  (+) Arthritis ,   Abdominal (+) - obese,   Peds  Hematology negative hematology ROS (+)   Anesthesia Other Findings Past Medical History: No date: Allergy No date: Anxiety No date: Arthritis No date: CAD (coronary artery disease)     Comment:  non-obstructive. cath 11/11 No date: Diabetes mellitus     Comment:  borderline diabetic - diet controlled  No date: History of chicken pox No date: History of phobia     Comment:  clastrophobia No date: Hyperlipidemia No date: Hypertension No date: Hypothyroidism No date: Kidney stones     Comment:  hx of  No date: Lichen sclerosus No date: Psoriasis No date: Urinary tract infection     Comment:  hx of    Reproductive/Obstetrics                             Anesthesia Physical Anesthesia Plan  ASA: III  Anesthesia Plan: General   Post-op Pain Management:    Induction: Intravenous  PONV Risk  Score and Plan: 2 and Propofol infusion  Airway Management Planned: Natural Airway  Additional Equipment:   Intra-op Plan:   Post-operative Plan:   Informed Consent: I have reviewed the patients History and Physical, chart, labs and discussed the procedure including the risks, benefits and alternatives for the proposed anesthesia with the patient or authorized representative who has indicated his/her understanding and acceptance.     Dental advisory given  Plan Discussed with: CRNA and Anesthesiologist  Anesthesia Plan Comments:         Anesthesia Quick Evaluation

## 2020-01-04 NOTE — Anesthesia Postprocedure Evaluation (Signed)
Anesthesia Post Note  Patient: ANYCE WINSTON  Procedure(s) Performed: ESOPHAGOGASTRODUODENOSCOPY (EGD) WITH PROPOFOL (N/A )  Patient location during evaluation: Endoscopy Anesthesia Type: General Level of consciousness: awake and alert and oriented Pain management: pain level controlled Vital Signs Assessment: post-procedure vital signs reviewed and stable Respiratory status: spontaneous breathing, nonlabored ventilation and respiratory function stable Cardiovascular status: blood pressure returned to baseline and stable Postop Assessment: no signs of nausea or vomiting Anesthetic complications: no     Last Vitals:  Vitals:   01/04/20 1047 01/04/20 1057  BP: (!) 176/78 (!) 191/82  Pulse:    Resp: 20   Temp:    SpO2:      Last Pain:  Vitals:   01/04/20 1057  TempSrc:   PainSc: 0-No pain                 Javoni Lucken

## 2020-01-04 NOTE — H&P (Signed)
[name], MD [address] [place], [place], [place], [address] [address], [place], [place], [place], [address] Phone: [phone]  Fax: [phone]  Primary Care Physician:  Jinny Sanders, MD   Pre-Procedure History & Physical: HPI:  Renee [name] is a 74 y.o. female is here for an endoscopy    Past Medical History:  Diagnosis Date  . Allergy   . Anxiety   . Arthritis   . CAD (coronary artery disease)    non-obstructive. cath 11/11  . Diabetes mellitus    borderline diabetic - diet controlled   . History of chicken pox   . History of phobia    clastrophobia  . Hyperlipidemia   . Hypertension   . Hypothyroidism   . Kidney stones    hx of   . Lichen sclerosus   . Psoriasis   . Urinary tract infection    hx of     Past Surgical History:  Procedure Laterality Date  . APPENDECTOMY    . CARDIAC CATHETERIZATION     2011  . CARPAL TUNNEL RELEASE     Left trigger finger  . CATARACT EXTRACTION W/PHACO Left 06/06/2018   Procedure: CATARACT EXTRACTION PHACO AND INTRAOCULAR LENS PLACEMENT (IOC);  Surgeon: Birder Robson, MD;  Location: [facility] ORS;  Service: Ophthalmology;  Laterality: Left;  [place]  00:23 AP% 16.0 CDE 3.69 Fluid pack lot # [id number]  . CATARACT EXTRACTION W/PHACO Right 09/19/2018   Procedure: CATARACT EXTRACTION PHACO AND INTRAOCULAR LENS PLACEMENT (IOC);  Surgeon: Birder Robson, MD;  Location: [facility] ORS;  Service: Ophthalmology;  Laterality: Right;  [place] 00:29.5 AP% 17.0 CDE$ 5.03 Fluid pack lot # [id number]  . COLONOSCOPY WITH PROPOFOL N/A 05/13/2017   Procedure: COLONOSCOPY WITH PROPOFOL;  Surgeon: Manya Silvas, MD;  Location: [facility] ENDOSCOPY;  Service: Endoscopy;  Laterality: N/A;  . EYE SURGERY  2004   lasik - right eye   . JOINT REPLACEMENT    . KNEE SURGERY Right 2015   arthroscopy  . TOTAL KNEE ARTHROPLASTY Bilateral 06/12/2015   Procedure: TOTAL KNEE BILATERAL;  Surgeon: Gaynelle Arabian, MD;  Location: [facility] ORS;  Service:  Orthopedics;  Laterality: Bilateral;  and epidural  . TUBAL LIGATION    . Tubes tied     and untied. and tied again    Prior to Admission medications   Medication Sig Start Date End Date Taking? Authorizing Provider  ACCU-CHEK COMPACT PLUS test strip USE AS DIRECTED TO TEST BLOOD SUGAR DAILY 10/27/18  Yes Bedsole, Amy E, MD  acetaminophen (TYLENOL) 650 MG CR tablet One tab as needed every 8 hours, 30 days 10/29/19  Yes [provider]  aspirin EC 81 MG tablet Take 81 mg by mouth daily.   Yes [provider]  Blood Glucose Monitoring Suppl (ACCU-CHEK COMPACT CARE KIT) KIT Meter Kit 01/29/16  Yes Margarita Rana, MD  clobetasol ointment (TEMOVATE) 0.48 % Apply 1 application topically daily as needed (psoriasis flares). 10/08/19  Yes Bedsole, Amy E, MD  diphenhydrAMINE (BENADRYL) 25 MG tablet Take 1 tablet (25 mg total) by mouth at bedtime as needed for sleep. 06/17/15  Yes Perkins, Alexzandrew L, PA-C  Emollient (CERAVE) CREA Apply 1 application topically daily as needed (psoriasis).    Yes [provider]  ezetimibe (ZETIA) 10 MG tablet Take 1 tablet (10 mg total) by mouth daily. 10/08/19  Yes Bedsole, Amy E, MD  gemfibrozil (LOPID) 600 MG tablet Take 1 tablet (600  mg total) by mouth every morning. 10/05/19  Yes Bedsole, Amy E, MD  glipiZIDE (GLUCOTROL XL) 5 MG 24 hr tablet TAKE 1 TABLET BY MOUTH EVERY DAY WITH BREAKFAST 08/16/19  Yes Bedsole, Amy E, MD  ibuprofen (ADVIL,MOTRIN) 200 MG tablet Take 200 mg by mouth every 6 (six) hours as needed for moderate pain.    Yes [provider]  levothyroxine (SYNTHROID) 75 MCG tablet TAKE 1 TABLET DAILY 10/05/19  Yes Bedsole, Amy E, MD  losartan (COZAAR) 50 MG tablet Take 50 mg by mouth daily. 10/31/19  Yes [provider]  Multiple Vitamin (MULTIVITAMIN) tablet Take 1 tablet by mouth every other day.    Yes [provider]  omeprazole (PRILOSEC) 40 MG capsule Take 1 capsule (40 mg total) by mouth daily. 11/29/19   Yes [name], MD  triamcinolone cream (KENALOG) 0.1 % APPLY TO AFFECTED AREAS ON BODY TWICE DAILY TO AFFECTED AREAS AS NEEDED 08/28/19  Yes [provider]  Vitamin D, Cholecalciferol, 1000 units TABS Take 2,000 Units by mouth every other day.    Yes [provider]  nitroGLYCERIN (NITROSTAT) 0.4 MG SL tablet Place 1 tablet (0.4 mg total) under the tongue every 5 (five) minutes as needed. 09/10/13   Minna Merritts, MD    Allergies as of 11/30/2019 - Review Complete 11/29/2019  Allergen Reaction Noted  . Other Rash 06/06/2015  . Codeine Nausea And Vomiting   . Metformin Nausea And Vomiting   . Robaxin [methocarbamol] Nausea Only 10/20/2015  . Simvastatin Swelling and Rash     Family History  Problem Relation Age of Onset  . COPD Mother   . Diabetes Mother   . Hypertension Mother   . Hypertension Father   . Diabetes Sister   . Hypertension Sister   . Diabetes Brother   . Arthritis Brother   . Heart disease Brother   . Arthritis Brother   . Diabetes Brother   . Heart disease Brother   . Stomach cancer Maternal Grandfather   . Stroke Maternal Grandfather   . Breast cancer Daughter 84       Double mastectomy    Social History   Socioeconomic History  . Marital status: Divorced    Spouse name: Not on file  . Number of children: 6  . Years of education: Not on file  . Highest education level: Not on file  Occupational History  . Occupation: Retired  Tobacco Use  . Smoking status: Never Smoker  . Smokeless tobacco: Never Used  Substance and Sexual Activity  . Alcohol use: Yes    Alcohol/week: 0.0 standard drinks    Comment: occasionally  . Drug use: No  . Sexual activity: Not Currently    Birth control/protection: Post-menopausal  Other Topics Concern  . Not on file  Social History Narrative   Regular exercise.    Divorced   Retired at [employer] in [employer], Loss adjuster, chartered.   Social Determinants of Health   Financial Resource Strain:   . Difficulty of  Paying Living Expenses: Not on file  Food Insecurity:   . Worried About Charity fundraiser in the Last Year: Not on file  . Ran Out of Food in the Last Year: Not on file  Transportation Needs:   . Lack of Transportation (Medical): Not on file  . Lack of Transportation (Non-Medical): Not on file  Physical Activity:   . Days of Exercise per Week: Not on file  . Minutes of Exercise per Session: Not on  file  Stress:   . Feeling of Stress : Not on file  Social Connections:   . Frequency of Communication with Friends and Family: Not on file  . Frequency of Social Gatherings with Friends and Family: Not on file  . Attends Religious Services: Not on file  . Active Member of Clubs or Organizations: Not on file  . Attends Archivist Meetings: Not on file  . Marital Status: Not on file  Intimate Partner Violence:   . Fear of Current or Ex-Partner: Not on file  . Emotionally Abused: Not on file  . Physically Abused: Not on file  . Sexually Abused: Not on file    Review of Systems: See HPI, otherwise negative ROS  Physical Exam: BP (!) 157/77   Pulse 81   Temp (!) 97.5 F (36.4 C) (Temporal)   Resp 16   Ht '5\' 4"'$  (1.626 m)   Wt 70.9 kg   SpO2 97%   BMI 26.83 kg/m  General:   Alert,  pleasant and cooperative in NAD Head:  Normocephalic and atraumatic. Neck:  Supple; no masses or thyromegaly. Lungs:  Clear throughout to auscultation, normal respiratory effort.    Heart:  +S1, +S2, Regular rate and rhythm, No edema. Abdomen:  Soft, nontender and nondistended. Normal bowel sounds, without guarding, and without rebound.   Neurologic:  Alert and  oriented x4;  grossly normal neurologically.  Impression/Plan: Renee [name] is here for an endoscopy  to be performed for  evaluation of dysphagia    Risks, benefits, limitations, and alternatives regarding endoscopy have been reviewed with the patient.  Questions have been answered.  All parties agreeable.   [name], MD   01/04/2020, 9:38 AM

## 2020-01-04 NOTE — Anesthesia Procedure Notes (Signed)
Date/Time: 01/04/2020 10:17 AM Performed by: Nelda Marseille, CRNA Pre-anesthesia Checklist: Patient identified, Emergency Drugs available, Suction available, Patient being monitored and Timeout performed Oxygen Delivery Method: Nasal cannula

## 2020-01-04 NOTE — Op Note (Signed)
[facility] Patient Name: Renee [name] Procedure Date: 01/04/2020 9:37 AM MRN: [id number] Account #: [id number] Date of Birth: 03/02/1946 Admit Type: Outpatient Age: 74 Room: [facility] ENDO ROOM 3 Gender: Female Note Status: Finalized Procedure:             Upper GI endoscopy Indications:           Dysphagia Providers:             [name] MD, MD Referring MD:          Jinny Sanders MD, MD (Referring MD) Medicines:             Monitored Anesthesia Care Complications:         No immediate complications. Procedure:             Pre-Anesthesia Assessment:                        - Prior to the procedure, a History and Physical was                         performed, and patient medications, allergies and                         sensitivities were reviewed. The patient's tolerance                         of previous anesthesia was reviewed.                        - Prior to the procedure, a History and Physical was                         performed, and patient medications, allergies and                         sensitivities were reviewed. The patient's tolerance                         of previous anesthesia was reviewed.                        - The risks and benefits of the procedure and the                         sedation options and risks were discussed with the                         patient. All questions were answered and informed                         consent was obtained.                        - ASA Grade Assessment: II - A patient with mild                         systemic disease.                        After obtaining informed consent, the endoscope was  passed under direct vision. Throughout the procedure,                         the patient's blood pressure, pulse, and oxygen                         saturations were monitored continuously. The Endoscope                         was introduced through the mouth, and  advanced to the                         third part of duodenum. The upper GI endoscopy was                         accomplished with ease. The patient tolerated the                         procedure well. Findings:      The examined duodenum was normal.      The stomach was normal.      The cardia and gastric fundus were normal on retroflexion.      The examined esophagus was normal. Biopsies were taken with a cold       forceps for histology. Impression:            - Normal examined duodenum.                        - Normal stomach.                        - Normal esophagus. Biopsied. Recommendation:        - Await pathology results.                        - Discharge patient to home (with escort).                        - Resume previous diet.                        - Continue present medications.                        - Return to my office as previously scheduled. Procedure Code(s):     --- Professional ---                        [id number], Esophagogastroduodenoscopy, flexible,                         transoral; with biopsy, single or multiple Diagnosis Code(s):     --- Professional ---                        R13.10, Dysphagia, unspecified CPT copyright 2019 American Medical Association. All rights reserved. The codes documented in this report are preliminary and upon coder review may  be revised to meet current compliance requirements. [name], MD [name] MD, MD 01/04/2020 10:21:39 AM This report has been signed electronically. Number of Addenda: 0 Note Initiated On:  01/04/2020 9:37 AM Estimated Blood Loss:  Estimated blood loss: none.      [facility]

## 2020-01-07 ENCOUNTER — Telehealth: Payer: Self-pay | Admitting: Family Medicine

## 2020-01-07 ENCOUNTER — Other Ambulatory Visit: Payer: Self-pay

## 2020-01-07 ENCOUNTER — Other Ambulatory Visit (INDEPENDENT_AMBULATORY_CARE_PROVIDER_SITE_OTHER): Payer: Medicare Other

## 2020-01-07 DIAGNOSIS — E119 Type 2 diabetes mellitus without complications: Secondary | ICD-10-CM | POA: Diagnosis not present

## 2020-01-07 LAB — HEMOGLOBIN A1C: Hgb A1c MFr Bld: 7.2 % — ABNORMAL HIGH (ref 4.6–6.5)

## 2020-01-07 LAB — SURGICAL PATHOLOGY

## 2020-01-07 NOTE — Telephone Encounter (Signed)
-----   Message from Cloyd Stagers, RT sent at 01/01/2020  1:12 PM EST ----- Regarding: Lab Orders for Monday 1.11.2021 Please place lab orders for Monday 1.11.2021, appt notes state "A1C" Thank you, Dyke Maes RT(R)

## 2020-01-08 NOTE — Progress Notes (Signed)
No critical labs need to be addressed urgently. We will discuss labs in detail at upcoming office visit.   

## 2020-01-14 ENCOUNTER — Encounter: Payer: Self-pay | Admitting: Gastroenterology

## 2020-01-29 DIAGNOSIS — L820 Inflamed seborrheic keratosis: Secondary | ICD-10-CM | POA: Diagnosis not present

## 2020-01-29 DIAGNOSIS — D225 Melanocytic nevi of trunk: Secondary | ICD-10-CM | POA: Diagnosis not present

## 2020-01-29 DIAGNOSIS — D2271 Melanocytic nevi of right lower limb, including hip: Secondary | ICD-10-CM | POA: Diagnosis not present

## 2020-01-29 DIAGNOSIS — X32XXXA Exposure to sunlight, initial encounter: Secondary | ICD-10-CM | POA: Diagnosis not present

## 2020-01-29 DIAGNOSIS — Z85828 Personal history of other malignant neoplasm of skin: Secondary | ICD-10-CM | POA: Diagnosis not present

## 2020-01-29 DIAGNOSIS — D2272 Melanocytic nevi of left lower limb, including hip: Secondary | ICD-10-CM | POA: Diagnosis not present

## 2020-01-29 DIAGNOSIS — R208 Other disturbances of skin sensation: Secondary | ICD-10-CM | POA: Diagnosis not present

## 2020-01-29 DIAGNOSIS — L2084 Intrinsic (allergic) eczema: Secondary | ICD-10-CM | POA: Diagnosis not present

## 2020-01-29 DIAGNOSIS — L570 Actinic keratosis: Secondary | ICD-10-CM | POA: Diagnosis not present

## 2020-01-29 DIAGNOSIS — L538 Other specified erythematous conditions: Secondary | ICD-10-CM | POA: Diagnosis not present

## 2020-01-30 DIAGNOSIS — M47812 Spondylosis without myelopathy or radiculopathy, cervical region: Secondary | ICD-10-CM | POA: Diagnosis not present

## 2020-01-30 DIAGNOSIS — M159 Polyosteoarthritis, unspecified: Secondary | ICD-10-CM | POA: Diagnosis not present

## 2020-01-30 DIAGNOSIS — L409 Psoriasis, unspecified: Secondary | ICD-10-CM | POA: Diagnosis not present

## 2020-01-31 ENCOUNTER — Ambulatory Visit: Payer: Medicare Other | Admitting: Gastroenterology

## 2020-02-19 ENCOUNTER — Other Ambulatory Visit: Payer: Self-pay

## 2020-02-19 ENCOUNTER — Encounter: Payer: Self-pay | Admitting: Gastroenterology

## 2020-02-19 ENCOUNTER — Ambulatory Visit (INDEPENDENT_AMBULATORY_CARE_PROVIDER_SITE_OTHER): Payer: Medicare Other | Admitting: Gastroenterology

## 2020-02-19 VITALS — BP 168/88 | HR 75 | Temp 97.9°F | Ht 64.0 in | Wt 157.6 lb

## 2020-02-19 DIAGNOSIS — R1310 Dysphagia, unspecified: Secondary | ICD-10-CM

## 2020-02-19 NOTE — Progress Notes (Signed)
[name] MD, MRCP(U.K) [address]  [place]  [place], [place] [address]  Main: [phone]  Fax: [phone]   Primary Care Physician: Jinny Sanders, MD  Primary Gastroenterologist:  Dr. [name]   Dysphagia follow-up  HPI: Renee [name] is a 74 y.o. female    Summary of history :  Initially referred and seen for dysphagia in December 2020.  She also has a history of sicca syndrome.  Felt that her food got stuck in the middle of her chest.  Has false teeth.  Dysphagia was more significant with solids than for liquids.   Interval history   11/29/2019-2 03/18/2020  01/04/2020: EGD: Normal appearance of the esophagus, biopsies of the esophagus were taken which showed normal mucosa and no increase in neutrophil count.  Seen and evaluated by Dr. Rockey Situ in cardiology for chest discomfort.  No further evaluation recommended. She says since her last visit she make sure she can accompany with every meal with a drink and has had no issues with swallowing.  She is having some issues with her eyes causing redness and I suggested her to discuss with an ophthalmologist because it could probably be related to the sicca syndrome  Current Outpatient Medications  Medication Sig Dispense Refill  . ACCU-CHEK COMPACT PLUS test strip USE AS DIRECTED TO TEST BLOOD SUGAR DAILY 100 each 1  . acetaminophen (TYLENOL) 650 MG CR tablet One tab as needed every 8 hours, 30 days    . aspirin EC 81 MG tablet Take 81 mg by mouth daily.    . Blood Glucose Monitoring Suppl (ACCU-CHEK COMPACT CARE KIT) KIT Meter Kit 1 each 0  . clobetasol ointment (TEMOVATE) 1.30 % Apply 1 application topically daily as needed (psoriasis flares). 30 g 1  . diphenhydrAMINE (BENADRYL) 25 MG tablet Take 1 tablet (25 mg total) by mouth at bedtime as needed for sleep. 30 tablet 0  . Emollient (CERAVE) CREA Apply 1 application topically daily as needed (psoriasis).     . ezetimibe (ZETIA) 10 MG tablet Take 1 tablet (10 mg  total) by mouth daily. 90 tablet 3  . gemfibrozil (LOPID) 600 MG tablet Take 1 tablet (600 mg total) by mouth every morning. 90 tablet 3  . glipiZIDE (GLUCOTROL XL) 5 MG 24 hr tablet TAKE 1 TABLET BY MOUTH EVERY DAY WITH BREAKFAST 90 tablet 3  . ibuprofen (ADVIL,MOTRIN) 200 MG tablet Take 200 mg by mouth every 6 (six) hours as needed for moderate pain.     Marland Kitchen levothyroxine (SYNTHROID) 75 MCG tablet TAKE 1 TABLET DAILY 90 tablet 3  . losartan (COZAAR) 50 MG tablet Take 50 mg by mouth daily.    . Multiple Vitamin (MULTIVITAMIN) tablet Take 1 tablet by mouth every other day.     . nitroGLYCERIN (NITROSTAT) 0.4 MG SL tablet Place 1 tablet (0.4 mg total) under the tongue every 5 (five) minutes as needed. 30 tablet 6  . omeprazole (PRILOSEC) 40 MG capsule Take 1 capsule (40 mg total) by mouth daily. 90 capsule 1  . triamcinolone cream (KENALOG) 0.1 % APPLY TO AFFECTED AREAS ON BODY TWICE DAILY TO AFFECTED AREAS AS NEEDED    . Vitamin D, Cholecalciferol, 1000 units TABS Take 2,000 Units by mouth every other day.      No current facility-administered medications for this visit.    Allergies as of 02/19/2020 - Review Complete 01/04/2020  Allergen Reaction Noted  . Other Rash 06/06/2015  . Codeine Nausea And Vomiting   .  Metformin Nausea And Vomiting   . Robaxin [methocarbamol] Nausea Only 10/20/2015  . Simvastatin Swelling and Rash     ROS:  General: Negative for anorexia, weight loss, fever, chills, fatigue, weakness. ENT: Negative for hoarseness, difficulty swallowing , nasal congestion. CV: Negative for chest pain, angina, palpitations, dyspnea on exertion, peripheral edema.  Respiratory: Negative for dyspnea at rest, dyspnea on exertion, cough, sputum, wheezing.  GI: See history of present illness. GU:  Negative for dysuria, hematuria, urinary incontinence, urinary frequency, nocturnal urination.  Endo: Negative for unusual weight change.    Physical Examination:   There were no vitals  taken for this visit.  General: Well-nourished, well-developed in no acute distress.  Eyes: No icterus. Conjunctivae pink. Mouth: Oropharyngeal mucosa moist and pink , no lesions erythema or exudate. Lungs: Clear to auscultation bilaterally. Non-labored. Heart: Regular rate and rhythm, no murmurs rubs or gallops.  Abdomen: Bowel sounds are normal, nontender, nondistended, no hepatosplenomegaly or masses, no abdominal bruits or hernia , no rebound or guarding.   Extremities: No lower extremity edema. No clubbing or deformities. Neuro: Alert and oriented x 3.  Grossly intact. Skin: Warm and dry, no jaundice.   Psych: Alert and cooperative, normal mood and affect.   Imaging Studies: No results found.  Assessment and Plan:   Renee [name] is a 74 y.o. y/o female here to follow-up for dysphagia.  Very likely secondary to sicca syndrome with a dry mouth .  Reiterated the fact that she needs to sip on liquids while eating to help lubricate the food.   I suggested that she can discontinue the Prilosec   Dr [name]  MD,MRCP [facility]) Follow up in as needed

## 2020-02-26 ENCOUNTER — Telehealth: Payer: Self-pay | Admitting: Family Medicine

## 2020-02-26 NOTE — Telephone Encounter (Signed)
Patient called in regards to lab results She stated she had these done on 1/11 and did not hear from anyone about results It was for her A1C.  In the result encounter is showed that Dr Diona Browner would be discussing and office visit.   Please advise

## 2020-02-26 NOTE — Telephone Encounter (Signed)
Let pt know that A1C was improving in 12/2019.. down from 7.8 to 7.2. keep up the great work with diabetes control.

## 2020-02-27 DIAGNOSIS — H169 Unspecified keratitis: Secondary | ICD-10-CM | POA: Diagnosis not present

## 2020-02-27 NOTE — Telephone Encounter (Signed)
Left message for Renee [name] that her A1C was improving in 12/2019.. down from 7.8 to 7.2. keep up the great work with diabetes control.  I ask that she call us back if she has any questions.

## 2020-03-06 DIAGNOSIS — H169 Unspecified keratitis: Secondary | ICD-10-CM | POA: Diagnosis not present

## 2020-03-18 DIAGNOSIS — J329 Chronic sinusitis, unspecified: Secondary | ICD-10-CM | POA: Diagnosis not present

## 2020-03-18 DIAGNOSIS — H10422 Simple chronic conjunctivitis, left eye: Secondary | ICD-10-CM | POA: Diagnosis not present

## 2020-03-18 DIAGNOSIS — J342 Deviated nasal septum: Secondary | ICD-10-CM | POA: Diagnosis not present

## 2020-03-20 DIAGNOSIS — E119 Type 2 diabetes mellitus without complications: Secondary | ICD-10-CM | POA: Diagnosis not present

## 2020-03-20 LAB — HM DIABETES EYE EXAM

## 2020-03-24 ENCOUNTER — Encounter: Payer: Self-pay | Admitting: Family Medicine

## 2020-04-25 ENCOUNTER — Telehealth: Payer: Self-pay | Admitting: Family Medicine

## 2020-04-25 NOTE — Progress Notes (Signed)
  Chronic Care Management   Note  04/25/2020 Name: Renee [name] MRN: [id number] DOB: 1946/05/26  Renee [name] is a 74 y.o. year old female who is a primary care patient of Diona Browner, Amy E, MD. I reached out to [name] by phone today in response to a referral sent by Ms. Ozella Rocks PCP, Jinny Sanders, MD.   Ms. Stuebe was given information about Chronic Care Management services today including:  1. CCM service includes personalized support from designated clinical staff supervised by her physician, including individualized plan of care and coordination with other care providers 2. 24/7 contact phone numbers for assistance for urgent and routine care needs. 3. Service will only be billed when office clinical staff spend 20 minutes or more in a month to coordinate care. 4. Only one practitioner may furnish and bill the service in a calendar month. 5. The patient may stop CCM services at any time (effective at the end of the month) by phone call to the office staff.   Patient agreed to services and verbal consent obtained.    This note is not being shared with the patient for the following reason: To respect privacy (The patient or proxy has requested that the information not be shared). Follow up plan:   [name] [employer] Scheduler

## 2020-04-25 NOTE — Progress Notes (Signed)
°  Chronic Care Management   Outreach Note  04/25/2020 Name: Renee [name] MRN: [id number] DOB: 12/01/1946  Referred by: Jinny Sanders, MD Reason for referral : No chief complaint on file.   An unsuccessful telephone outreach was attempted today. The patient was referred to the pharmacist for assistance with care management and care coordination.    This note is not being shared with the patient for the following reason: To respect privacy (The patient or proxy has requested that the information not be shared).  Follow Up Plan:   [name] [employer] Scheduler

## 2020-05-19 DIAGNOSIS — M3501 Sicca syndrome with keratoconjunctivitis: Secondary | ICD-10-CM | POA: Diagnosis not present

## 2020-05-29 ENCOUNTER — Telehealth: Payer: Self-pay

## 2020-05-29 DIAGNOSIS — E119 Type 2 diabetes mellitus without complications: Secondary | ICD-10-CM

## 2020-05-29 DIAGNOSIS — I10 Essential (primary) hypertension: Secondary | ICD-10-CM

## 2020-05-29 NOTE — Telephone Encounter (Signed)
Please sign referral

## 2020-05-29 NOTE — Telephone Encounter (Signed)
Per written referral from PCP, requesting referral in Epic for [name] to chronic care management pharmacy services for the following conditions:   Essential hypertension, benign  [I10]  Diabetes mellitus without complication [id number]  Renee [name], PharmD Clinical Pharmacist [facility] [phone]

## 2020-05-30 ENCOUNTER — Other Ambulatory Visit: Payer: Self-pay

## 2020-05-30 ENCOUNTER — Ambulatory Visit: Payer: Medicare Other

## 2020-05-30 DIAGNOSIS — E119 Type 2 diabetes mellitus without complications: Secondary | ICD-10-CM

## 2020-05-30 DIAGNOSIS — I10 Essential (primary) hypertension: Secondary | ICD-10-CM

## 2020-05-30 DIAGNOSIS — E7800 Pure hypercholesterolemia, unspecified: Secondary | ICD-10-CM

## 2020-05-30 NOTE — Chronic Care Management (AMB) (Signed)
Chronic Care Management Pharmacy  Name: Renee [name]  MRN: [id number] DOB: 29-Aug-1946  Chief Complaint/ HPI  [name],  74 y.o., female presents for their Initial CCM visit with the clinical pharmacist via telephone.  PCP : Jinny Sanders, MD  Their chronic conditions include: HTN, CAD, allergic rhinitis, chronic diarrhea, diabetes, hypothyroidism, OA, osteopenia, hypercholesterolemia, vitamin D, GAD, epigastric pain   Patient concerns: Concerned about cost of OTCs meds, specifically Tylenol Arthritis 8 hr 650 mg, usually takes 2 before bed--> recommended generic Tylenol 325 mg for lower cost  Office Visits:  10/05/19: [facility] - pt declines DM med changes, BP controlled, cholesterol at goal, thyroid stable, start PPI for dysphagia, Flonase trial for sinus pressure  Consult Visit:  02/19/20: GI - dysphagia f/u, EGD normal, drink water with each meal, likely associated with dry mouth/Sjogren's Syndrome, d/c Prilosec   01/30/20: OA, psoriasis, taking Tylenol Arthritis and Advil PRN, topical steroids for psoriasis, no med changes rtc 6 months    12/10/19: Cardiology - cont current meds  Allergies  Allergen Reactions   Other Rash    Metal    Codeine Nausea And Vomiting   Metformin Nausea And Vomiting   Robaxin [Methocarbamol] Nausea Only   Simvastatin Swelling and Rash   Medications: Outpatient Encounter Medications as of 05/30/2020  Medication Sig Note   ACCU-CHEK COMPACT PLUS test strip USE AS DIRECTED TO TEST BLOOD SUGAR DAILY    acetaminophen (TYLENOL) 650 MG CR tablet One tab as needed every 8 hours, 30 days    Blood Glucose Monitoring Suppl (ACCU-CHEK COMPACT CARE KIT) KIT Meter Kit    clobetasol ointment (TEMOVATE) 3.97 % Apply 1 application topically daily as needed (psoriasis flares).    Emollient (CERAVE) CREA Apply 1 application topically daily as needed (psoriasis).  11/17/2016: Mix with triamcinolone   ezetimibe (ZETIA) 10 MG tablet Take 1 tablet (10 mg  total) by mouth daily.    Flaxseed, Linseed, (FLAX SEED OIL PO) Take by mouth.    fluticasone (FLONASE) 50 MCG/ACT nasal spray Place 1 spray into both nostrils daily.    gemfibrozil (LOPID) 600 MG tablet Take 1 tablet (600 mg total) by mouth every morning.    glipiZIDE (GLUCOTROL XL) 5 MG 24 hr tablet TAKE 1 TABLET BY MOUTH EVERY DAY WITH BREAKFAST    levothyroxine (SYNTHROID) 75 MCG tablet TAKE 1 TABLET DAILY    losartan (COZAAR) 50 MG tablet Take 50 mg by mouth daily.    Multiple Vitamin (MULTIVITAMIN) tablet Take 1 tablet by mouth every other day.     nitroGLYCERIN (NITROSTAT) 0.4 MG SL tablet Place 1 tablet (0.4 mg total) under the tongue every 5 (five) minutes as needed.    triamcinolone cream (KENALOG) 0.1 % APPLY TO AFFECTED AREAS ON BODY TWICE DAILY TO AFFECTED AREAS AS NEEDED    Vitamin D, Cholecalciferol, 1000 units TABS Take 2,000 Units by mouth every other day.     aspirin EC 81 MG tablet Take 81 mg by mouth daily.    diphenhydrAMINE (BENADRYL) 25 MG tablet Take 1 tablet (25 mg total) by mouth at bedtime as needed for sleep.    ibuprofen (ADVIL,MOTRIN) 200 MG tablet Take 200 mg by mouth every 6 (six) hours as needed for moderate pain.     No facility-administered encounter medications on file as of 05/30/2020.   Current Diagnosis/Assessment:   [employer]: Medium Risk   Difficulty of Paying Living Expenses: Somewhat hard   Goals Addressed  This Visit's Progress    Pharmacy Care Plan       CARE PLAN ENTRY  Current Barriers:   Chronic Disease Management support, education, and care coordination needs related to Hypertension, Hyperlipidemia, and Diabetes   Hypertension  Pharmacist Clinical Goal(s): o Over the next 6 months, patient will work with PharmD and providers to achieve BP goal <140/90 mmHg  Current regimen:   Losartan 50 mg - 1 tablet daily   Interventions: o Begin checking home blood pressure once weekly  Patient  self care activities - Over the next 6 months, patient will: o Check blood pressure weekly, document, and provide at future appointment - call if blood pressure is above 140/90 mmHg o  If possible, purchase Omron home blood pressure monitor and check blood pressure before breakfast and before bedtime for 7 days prior to next visit. o Ensure daily salt intake < 2300 mg/day  Hyperlipidemia  Pharmacist Clinical Goal(s): o Over the next 6 months, patient will work with PharmD and providers to achieve LDL goal < 70  Current regimen:   Ezetimibe 10 mg - 1 tablet daily   Gemfibrozil 600 mg - 1 tablet every morning   Failed therapies: statins   Interventions: o Continue current medications, dietary modifications and exercise   Patient self care activities - Over the next 6 months, patient will: o Continue medications as prescribed o Slowly increase exercise with goal of 30 minutes, 5 days per week o Incorporate a healthy diet high in vegetables, fruits and whole grains with low-fat dairy products, chicken, fish, legumes, non-tropical vegetable oils and nuts. Limit intake of sweets, sugar-sweetened beverages and red meats.  Diabetes  Pharmacist Clinical Goal(s): o Over the next 6 months, patient will work with PharmD and providers to maintain A1c goal < 7.5%  Current regimen:   Glipizide 5 mg XL - 1 tablet daily  Interventions: o Continue current medications   Patient self care activities - Over the next 6 months, patient will: o Check blood sugar with symptoms of low blood sugar, document, and provide at future appointments o Contact provider with any episodes of hypoglycemia (blood sugar < 70 mg/dL)  Additional recommendations:  To reduce cost - try regular Tylenol 325 mg 2 tablets every 6 hours as needed instead of Tylenol Arthritis 650 mg   Initial goal documentation      Hypertension   CMP Latest Ref Rng & Units 09/28/2019 07/10/2019 09/25/2018  Glucose 70 - 99 mg/dL  160(H) 139(H) 142(H)  BUN 6 - 23 mg/dL '23 21 22  '$ Creatinine 0.40 - 1.20 mg/dL 0.74 0.69 0.68  Sodium 135 - 145 mEq/L 143 142 141  Potassium 3.5 - 5.1 mEq/L 4.1 3.9 3.9  Chloride 96 - 112 mEq/L 107 107 106  CO2 19 - 32 mEq/L '25 26 27  '$ Calcium 8.4 - 10.5 mg/dL 10.0 9.7 9.9  Total Protein 6.0 - 8.3 g/dL 7.4 7.6 7.7  Total Bilirubin 0.2 - 1.2 mg/dL 0.5 0.4 0.4  Alkaline Phos 39 - 117 U/L 63 59 54  AST 0 - 37 U/L '27 22 24  '$ ALT 0 - 35 U/L '23 19 19   '$ Office blood pressures are: BP Readings from Last 3 Encounters:  02/19/20 (!) 168/88  01/04/20 (!) 191/82  12/10/19 (!) 150/84   Patient has failed these meds in the past: none reported Patient checks BP at home: daughter comes to check BP occasionally  Patient home BP readings are ranging: fluctuates a lot - reports usually 130s/80s  at home, highest SBP 145  BP < 140/90 mmHg Patient is currently controlled on the following medications:   Losartan 50 mg - 1 tablet daily   We discussed: refills timely; thinks stress at doctors visit has caused BP to increase at times  Plan: Continue current medications; Continue to check blood pressure weekly with daughter. If possible, purchase Omron home blood pressure monitor and check blood pressure before breakfast and before bedtime for 7 days prior to next visit.  Hyperlipidemia/CAD   Lipid Panel     Component Value Date/Time   CHOL 142 09/28/2019 0854   CHOL 168 10/20/2015 1241   TRIG 77.0 09/28/2019 0854   HDL 38.10 (L) 09/28/2019 0854   HDL 47 10/20/2015 1241   LDLCALC 88 09/28/2019 0854   LDLCALC 102 (H) 10/20/2015 1241    CBC Latest Ref Rng & Units 03/28/2018 01/31/2018 11/17/2016  WBC 4.0 - 10.5 K/uL 10.0 5.8 16.9(H)  Hemoglobin 12.0 - 15.0 g/dL 13.2 13.2 13.4  Hematocrit 36 - 46 % 40.6 39.7 39.4  Platelets 150 - 400 K/uL 380 353.0 423(H)   LDL goal < 70 Patient has failed these meds in past: simvastatin - swelling/rash  Patient is currently uncontrolled on the following  medications:   Ezetimibe 10 mg - 1 tablet daily   Gemfibrozil 600 mg - 1 tablet every morning   Aspirin 81 mg - 1 tablet daily   Nitroglycerin 0.4 mg SL - PRN  Assessment: LDL above goal, unable to tolerate statins and cost is limiting factor; CBC stable on aspirin  We discussed: reports on gemfibrozil for a long time, no cost; ezetimibe was expensive but able to afford now - $20 [facility] - coupon)  Plan: Continue current medications; Limit cholesterol content in foods and increase exercise as tolerated.   Diabetes   Recent Relevant Labs: Lab Results  Component Value Date/Time   HGBA1C 7.2 (H) 01/07/2020 12:16 PM   HGBA1C 7.9 (H) 09/28/2019 08:54 AM    Checking BG: Rarely - last checked about 2 weeks ago Some difficulty sticking fingers/pain and cracking  Recent FBG Readings: 140-150s Reports feeling weak if BG < 125, none recently   A1c goal < 7.5% (patient preference) Patient has failed these meds in past: metformin - nausea/diarrhea, Januvia - cost Patient is currently controlled on the following medications:   Glipizide 5 mg XL - 1 tablet daily  Last diabetic eye exam:  Lab Results  Component Value Date/Time   HMDIABEYEEXA No Retinopathy 03/20/2020 12:00 AM    Last diabetic foot exam:  Lab Results  Component Value Date/Time   HMDIABFOOTEX done 10/05/2019 12:00 AM    We discussed: Reports balanced diet, limits cookies/chips, carbohydrates. Generic medications preferred due to cost; Denies interest in any additional medication or more stringent control.   Plan: Continue current medications   Hypothyroidism   Lab Results  Component Value Date/Time   TSH 4.66 (H) 09/28/2019 08:54 AM   TSH 1.98 09/25/2018 11:08 AM   TSH 2.80 09/20/2017 11:58 AM   Patient has failed these meds in past: none  Patient is currently controlled on the following medications:   Levothyroxine 75 mcg - 1 tablet daily before breakfast   We discussed: confirmed adherence, denies  concerns   Plan: Continue current medications   Osteoarthritis   Patient has failed these meds in past: none reported Patient is currently controlled on the following medications:   Tylenol Arthritis 650 mg - 1 tablet TID PRN  Advil 200 mg -  1 tablet TID PRN   Voltaren gel 1% - apply QID PRN (OTC)  We discussed: reports doing well, cost concern with Tylenol Arthritis and Voltaren; takes Tylenol 2 tablets at bedtime several days per week, very rarely takes Advil   Plan: Continue current medications; May switch to regular Tylenol 325 mg due to cost and increase frequency to 2 tablets every 6 hours PRN.  Vitamin D Deficiency    Vitamin D: 09/28/19 41  Patient has failed these meds in past: none  Patient is currently controlled on the following medications:   Vitamin D 2000 IU - 1 tablet daily (occasionally forgets)  Plan: Continue current medications  Medication Management  Misc: Clobetasol ointment 0.05% - applies PRN, triamcinolone cream 0.1% - applies PRN (  OTCs: multivitamin, Benadryl 25 mg (not taking anymore) - previously for sleep,  thera tears eye drops OTC PRN, flonase nasal spray PRN, Allegra 180 mg daily, flax seed oil, famotidine 20 mg PRN - due to some reflux with spicy foods, magnesium for leg cramps  Pharmacy/Benefits: Medicare Part D ([employer])/[facility] (Meds mostly synced) & [facility] for certain medications with coupons (Zetia, creams)  Adherence: no concerns  Affordability: cost concerns but currently able to afford medications  CCM Follow Up: 11/27/20 at 4:00 PM (telephone)  Debbora Dus, PharmD Clinical Pharmacist [facility] at [facility] [phone]

## 2020-05-30 NOTE — Patient Instructions (Addendum)
Dear Renee [name],  It was a pleasure meeting you during our initial appointment on May 30, 2020. Below is a summary of the goals we discussed and components of chronic care management. Please contact me anytime with questions or concerns.   Visit Information  Goals Addressed            This Visit's Progress   . Pharmacy Care Plan       CARE PLAN ENTRY  Current Barriers:  . Chronic Disease Management support, education, and care coordination needs related to Hypertension, Hyperlipidemia, and Diabetes   Hypertension . Pharmacist Clinical Goal(s): o Over the next 6 months, patient will work with PharmD and providers to achieve BP goal <140/90 mmHg . Current regimen:   Losartan 50 mg - 1 tablet daily  . Interventions: o Begin checking home blood pressure once weekly . Patient self care activities - Over the next 6 months, patient will: o Check blood pressure weekly, document, and provide at future appointment - call if blood pressure is above 140/90 mmHg o  If possible, purchase Omron home blood pressure monitor and check blood pressure before breakfast and before bedtime for 7 days prior to next visit. o Ensure daily salt intake < 2300 mg/day  Hyperlipidemia . Pharmacist Clinical Goal(s): o Over the next 6 months, patient will work with PharmD and providers to achieve LDL goal < 70 . Current regimen:  . Ezetimibe 10 mg - 1 tablet daily  . Gemfibrozil 600 mg - 1 tablet every morning  . Failed therapies: statins  . Interventions: o Continue current medications, dietary modifications and exercise  . Patient self care activities - Over the next 6 months, patient will: o Continue medications as prescribed o Slowly increase exercise with goal of 30 minutes, 5 days per week o Incorporate a healthy diet high in vegetables, fruits and whole grains with low-fat dairy products, chicken, fish, legumes, non-tropical vegetable oils and nuts. Limit intake of sweets, sugar-sweetened  beverages and red meats.  Diabetes . Pharmacist Clinical Goal(s): o Over the next 6 months, patient will work with PharmD and providers to maintain A1c goal < 7.5% . Current regimen:   Glipizide 5 mg XL - 1 tablet daily . Interventions: o Continue current medications  . Patient self care activities - Over the next 6 months, patient will: o Check blood sugar with symptoms of low blood sugar, document, and provide at future appointments o Contact provider with any episodes of hypoglycemia (blood sugar < 70 mg/dL)  Additional recommendations: . To reduce cost - try regular Tylenol 325 mg 2 tablets every 6 hours as needed instead of Tylenol Arthritis 650 mg   Initial goal documentation       Renee [name] was given information about Chronic Care Management services today including:  1. CCM service includes personalized support from designated clinical staff supervised by her physician, including individualized plan of care and coordination with other care providers 2. 24/7 contact phone numbers for assistance for urgent and routine care needs. 3. Standard insurance, coinsurance, copays and deductibles apply for chronic care management only during months in which we provide at least 20 minutes of these services. Most insurances cover these services at 100%, however patients may be responsible for any copay, coinsurance and/or deductible if applicable. This service may help you avoid the need for more expensive face-to-face services. 4. Only one practitioner may furnish and bill the service in a calendar month. 5. The patient may stop CCM services at any  time (effective at the end of the month) by phone call to the office staff.  Patient agreed to services and verbal consent obtained.   The patient verbalized understanding of instructions provided today and agreed to receive a mailed copy of patient instruction and/or educational materials. Telephone follow up appointment with pharmacy team  member scheduled for: 11/27/20 at 4:00 PM (telephone)  Debbora Dus, PharmD Clinical Pharmacist [facility] at [facility] [phone]  Diabetes Mellitus and Standards of Medical Care Managing diabetes (diabetes mellitus) can be complicated. Your diabetes treatment may be managed by a team of health care providers, including: A physician who specializes in diabetes (endocrinologist). A nurse practitioner or physician assistant. Nurses. A diet and nutrition specialist (registered dietitian). A certified diabetes educator (CDE). An exercise specialist. A pharmacist. An eye doctor. A foot specialist (podiatrist). A dentist. A primary care provider. A mental health provider. Your health care providers follow guidelines to help you get the best quality of care. The following schedule is a general guideline for your diabetes management plan. Your health care providers may give you more specific instructions. Physical exams Upon being diagnosed with diabetes mellitus, and each year after that, your health care provider will ask about your medical and family history. He or she will also do a physical exam. Your exam may include: Measuring your height, weight, and body mass index (BMI). Checking your blood pressure. This will be done at every routine medical visit. Your target blood pressure may vary depending on your medical conditions, your age, and other factors. Thyroid gland exam. Skin exam. Screening for damage to your nerves (peripheral neuropathy). This may include checking the pulse in your legs and feet and checking the level of sensation in your hands and feet. A complete foot exam to inspect the structure and skin of your feet, including checking for cuts, bruises, redness, blisters, sores, or other problems. Screening for blood vessel (vascular) problems, which may include checking the pulse in your legs and feet and checking your temperature. Blood tests Depending on  your treatment plan and your personal needs, you may have the following tests done: HbA1c (hemoglobin A1c). This test provides information about blood sugar (glucose) control over the previous 2-3 months. It is used to adjust your treatment plan, if needed. This test will be done: At least 2 times a year, if you are meeting your treatment goals. 4 times a year, if you are not meeting your treatment goals or if treatment goals have changed. Lipid testing, including total, LDL, and HDL cholesterol and triglyceride levels. The goal for LDL is less than 100 mg/dL (5.5 mmol/L). If you are at high risk for complications, the goal is less than 70 mg/dL (3.9 mmol/L). The goal for HDL is 40 mg/dL (2.2 mmol/L) or higher for men and 50 mg/dL (2.8 mmol/L) or higher for women. An HDL cholesterol of 60 mg/dL (3.3 mmol/L) or higher gives some protection against heart disease. The goal for triglycerides is less than 150 mg/dL (8.3 mmol/L). Liver function tests. Kidney function tests. Thyroid function tests. Dental and eye exams Visit your dentist two times a year. If you have type 1 diabetes, your health care provider may recommend an eye exam 3-5 years after you are diagnosed, and then once a year after your first exam. For children with type 1 diabetes, a health care provider may recommend an eye exam when your child is age 94 or older and has had diabetes for 3-5 years. After the first exam,  your child should get an eye exam once a year. If you have type 2 diabetes, your health care provider may recommend an eye exam as soon as you are diagnosed, and then once a year after your first exam. Immunizations  The yearly flu (influenza) vaccine is recommended for everyone 6 months or older who has diabetes. The pneumonia (pneumococcal) vaccine is recommended for everyone 2 years or older who has diabetes. If you are 110 or older, you may get the pneumonia vaccine as a series of two separate shots. The hepatitis B  vaccine is recommended for adults shortly after being diagnosed with diabetes. Adults and children with diabetes should receive all other vaccines according to age-specific recommendations from the Centers for Disease Control and Prevention (CDC). Mental and emotional health Screening for symptoms of eating disorders, anxiety, and depression is recommended at the time of diagnosis and afterward as needed. If your screening shows that you have symptoms (positive screening result), you may need more evaluation and you may work with a mental health care provider. Treatment plan Your treatment plan will be reviewed at every medical visit. You and your health care provider will discuss: How you are taking your medicines, including insulin. Any side effects you are experiencing. Your blood glucose target goals. The frequency of your blood glucose monitoring. Lifestyle habits, such as activity level as well as tobacco, alcohol, and substance use. Diabetes self-management education Your health care provider will assess how well you are monitoring your blood glucose levels and whether you are taking your insulin correctly. He or she may refer you to: A certified diabetes educator to manage your diabetes throughout your life, starting at diagnosis. A registered dietitian who can create or review your personal nutrition plan. An exercise specialist who can discuss your activity level and exercise plan. Summary Managing diabetes (diabetes mellitus) can be complicated. Your diabetes treatment may be managed by a team of health care providers. Your health care providers follow guidelines in order to help you get the best quality of care. Standards of care including having regular physical exams, blood tests, blood pressure monitoring, immunizations, screening tests, and education about how to manage your diabetes. Your health care providers may also give you more specific instructions based on your individual  health. This information is not intended to replace advice given to you by your health care provider. Make sure you discuss any questions you have with your health care provider. Document Revised: 09/01/2018 Document Reviewed: 09/10/2016 Elsevier Patient Education  [employer].

## 2020-06-16 NOTE — Progress Notes (Signed)
I have collaborated with the care management provider regarding care management and care coordination activities outlined in this encounter and have reviewed this encounter including documentation in the note and care plan. I am certifying that I agree with the content of this note and encounter as supervising physician.   Signed,  Rayla Pember T. Yesha Muchow, MD  

## 2020-07-07 ENCOUNTER — Telehealth: Payer: Self-pay | Admitting: Family Medicine

## 2020-07-08 ENCOUNTER — Other Ambulatory Visit: Payer: Self-pay

## 2020-07-08 ENCOUNTER — Ambulatory Visit (INDEPENDENT_AMBULATORY_CARE_PROVIDER_SITE_OTHER): Payer: Medicare Other | Admitting: Family Medicine

## 2020-07-08 ENCOUNTER — Encounter: Payer: Self-pay | Admitting: Family Medicine

## 2020-07-08 VITALS — BP 140/70 | HR 90 | Temp 98.4°F | Ht 63.0 in | Wt 151.8 lb

## 2020-07-08 DIAGNOSIS — F5104 Psychophysiologic insomnia: Secondary | ICD-10-CM

## 2020-07-08 DIAGNOSIS — F331 Major depressive disorder, recurrent, moderate: Secondary | ICD-10-CM | POA: Insufficient documentation

## 2020-07-08 DIAGNOSIS — F411 Generalized anxiety disorder: Secondary | ICD-10-CM | POA: Diagnosis not present

## 2020-07-08 MED ORDER — CITALOPRAM HYDROBROMIDE 20 MG PO TABS: 20.0000 mg | ORAL_TABLET | Freq: Every day | ORAL | 3 refills | Status: DC

## 2020-07-08 MED ORDER — TRAZODONE HCL 50 MG PO TABS: 25.0000 mg | ORAL_TABLET | Freq: Every evening | ORAL | 3 refills | Status: DC | PRN

## 2020-07-08 NOTE — Assessment & Plan Note (Addendum)
R/O thyroid change. Start citalopram At Night, reviewed course and possible SE.  Re-eval in 4 weeks.  Refused psychology referral at this time.

## 2020-07-08 NOTE — Assessment & Plan Note (Signed)
Start trazodone at bedtime if not improving with citalopram.

## 2020-07-08 NOTE — Progress Notes (Signed)
Chief Complaint  Patient presents with  . Anxiety  . Depression    History of Present Illness: HPI    74 year old female patient present with worsening anxiety and  New depression.   She has had worsening symptoms in last year with COVID19, now even worse in last 2-4 weeks.Feeling very isolated.  She has been more sadness, tearful. Both trouble falling asleep and staying.  She has been having issues sleeping and feeling very tired during the day.  She is trying to work in garden... no anhedonia.  GAD: previously well controlled on sertraline 50 mg daily.  Was on for 6 months.. stopped  With SE of headaches. When she was on this she felt more relaxed.  Lab Results  Component Value Date   TSH 4.66 (H) 09/28/2019  Free t3 and free 4 at goal last check on levo 75 mcg daily.   PHQ9 14 GAD7 19   This visit occurred during the SARS-CoV-2 public health emergency.  Safety protocols were in place, including screening questions prior to the visit, additional usage of staff PPE, and extensive cleaning of exam room while observing appropriate contact time as indicated for disinfecting solutions.   COVID 19 screen:  No recent travel or known exposure to COVID19 The patient denies respiratory symptoms of COVID 19 at this time. The importance of social distancing was discussed today.     Review of Systems  Constitutional: Negative for chills and fever.  HENT: Negative for congestion and ear pain.   Eyes: Negative for pain and redness.  Respiratory: Negative for cough and shortness of breath.   Cardiovascular: Negative for chest pain, palpitations and leg swelling.  Gastrointestinal: Negative for abdominal pain, blood in stool, constipation, diarrhea, nausea and vomiting.  Genitourinary: Negative for dysuria.  Musculoskeletal: Negative for falls and myalgias.  Skin: Negative for rash.  Neurological: Negative for dizziness.  Psychiatric/Behavioral: Positive for depression. Negative  for hallucinations, memory loss, substance abuse and suicidal ideas. The patient is nervous/anxious.       Past Medical History:  Diagnosis Date  . Allergy   . Anxiety   . Arthritis   . CAD (coronary artery disease)    non-obstructive. cath 11/11  . Diabetes mellitus    borderline diabetic - diet controlled   . History of chicken pox   . History of phobia    clastrophobia  . Hyperlipidemia   . Hypertension   . Hypothyroidism   . Kidney stones    hx of   . Lichen sclerosus   . Psoriasis   . Urinary tract infection    hx of     reports that she has never smoked. She has never used smokeless tobacco. She reports current alcohol use. She reports that she does not use drugs.   Current Outpatient Medications:  .  ACCU-CHEK COMPACT PLUS test strip, USE AS DIRECTED TO TEST BLOOD SUGAR DAILY, Disp: 100 each, Rfl: 1 .  acetaminophen (TYLENOL) 650 MG CR tablet, One tab as needed every 8 hours, 30 days, Disp: , Rfl:  .  aspirin EC 81 MG tablet, Take 81 mg by mouth daily., Disp: , Rfl:  .  Blood Glucose Monitoring Suppl (ACCU-CHEK COMPACT CARE KIT) KIT, Meter Kit, Disp: 1 each, Rfl: 0 .  clobetasol ointment (TEMOVATE) 2.77 %, Apply 1 application topically daily as needed (psoriasis flares)., Disp: 30 g, Rfl: 1 .  diphenhydrAMINE (BENADRYL) 25 MG tablet, Take 1 tablet (25 mg total) by mouth at bedtime as needed  for sleep., Disp: 30 tablet, Rfl: 0 .  Emollient (CERAVE) CREA, Apply 1 application topically daily as needed (psoriasis). , Disp: , Rfl:  .  ezetimibe (ZETIA) 10 MG tablet, Take 1 tablet (10 mg total) by mouth daily., Disp: 90 tablet, Rfl: 3 .  Flaxseed, Linseed, (FLAX SEED OIL PO), Take by mouth., Disp: , Rfl:  .  fluticasone (FLONASE) 50 MCG/ACT nasal spray, Place 1 spray into both nostrils daily., Disp: , Rfl:  .  gemfibrozil (LOPID) 600 MG tablet, Take 1 tablet (600 mg total) by mouth every morning., Disp: 90 tablet, Rfl: 3 .  glipiZIDE (GLUCOTROL XL) 5 MG 24 hr tablet, TAKE 1  TABLET BY MOUTH EVERY DAY WITH BREAKFAST, Disp: 90 tablet, Rfl: 3 .  ibuprofen (ADVIL,MOTRIN) 200 MG tablet, Take 200 mg by mouth every 6 (six) hours as needed for moderate pain. , Disp: , Rfl:  .  levothyroxine (SYNTHROID) 75 MCG tablet, TAKE 1 TABLET DAILY, Disp: 90 tablet, Rfl: 3 .  losartan (COZAAR) 50 MG tablet, Take 50 mg by mouth daily., Disp: , Rfl:  .  Multiple Vitamin (MULTIVITAMIN) tablet, Take 1 tablet by mouth every other day. , Disp: , Rfl:  .  nitroGLYCERIN (NITROSTAT) 0.4 MG SL tablet, Place 1 tablet (0.4 mg total) under the tongue every 5 (five) minutes as needed., Disp: 30 tablet, Rfl: 6 .  triamcinolone cream (KENALOG) 0.1 %, APPLY TO AFFECTED AREAS ON BODY TWICE DAILY TO AFFECTED AREAS AS NEEDED, Disp: , Rfl:  .  Vitamin D, Cholecalciferol, 1000 units TABS, Take 2,000 Units by mouth every other day. , Disp: , Rfl:    Observations/Objective: Blood pressure 140/70, pulse 90, temperature 98.4 F (36.9 C), temperature source Temporal, height '5\' 3"'$  (1.6 m), weight 151 lb 12 oz (68.8 kg), SpO2 96 %.  Physical Exam Constitutional:      General: She is not in acute distress.    Appearance: Normal appearance. She is well-developed. She is not ill-appearing or toxic-appearing.  HENT:     Head: Normocephalic.     Right Ear: Hearing, tympanic membrane, ear canal and external ear normal. Tympanic membrane is not erythematous, retracted or bulging.     Left Ear: Hearing, tympanic membrane, ear canal and external ear normal. Tympanic membrane is not erythematous, retracted or bulging.     Nose: No mucosal edema or rhinorrhea.     Right Sinus: No maxillary sinus tenderness or frontal sinus tenderness.     Left Sinus: No maxillary sinus tenderness or frontal sinus tenderness.     Mouth/Throat:     Pharynx: Uvula midline.  Eyes:     General: Lids are normal. Lids are everted, no foreign bodies appreciated.     Conjunctiva/sclera: Conjunctivae normal.     Pupils: Pupils are equal,  round, and reactive to light.  Neck:     Thyroid: No thyroid mass or thyromegaly.     Vascular: No carotid bruit.     Trachea: Trachea normal.  Cardiovascular:     Rate and Rhythm: Normal rate and regular rhythm.     Pulses: Normal pulses.     Heart sounds: Normal heart sounds, S1 normal and S2 normal. No murmur heard.  No friction rub. No gallop.   Pulmonary:     Effort: Pulmonary effort is normal. No tachypnea or respiratory distress.     Breath sounds: Normal breath sounds. No decreased breath sounds, wheezing, rhonchi or rales.  Abdominal:     General: Bowel sounds are normal.  Palpations: Abdomen is soft.     Tenderness: There is no abdominal tenderness.  Musculoskeletal:     Cervical back: Normal range of motion and neck supple.  Skin:    General: Skin is warm and dry.     Findings: No rash.  Neurological:     Mental Status: She is alert.  Psychiatric:        Attention and Perception: Attention normal.        Mood and Affect: Mood is depressed. Mood is not anxious. Affect is tearful.        Speech: Speech normal.        Behavior: Behavior is agitated. Behavior is not withdrawn. Behavior is cooperative.        Thought Content: Thought content normal.        Cognition and Memory: Cognition normal.        Judgment: Judgment normal.      Assessment and Plan      Eliezer Lofts, MD

## 2020-07-08 NOTE — Patient Instructions (Addendum)
Please stop at the lab to have labs drawn. Start trazodone for sleep at night. Start celexa at bedtime.

## 2020-07-08 NOTE — Assessment & Plan Note (Signed)
Inadequate control.

## 2020-07-09 ENCOUNTER — Telehealth: Payer: Self-pay

## 2020-07-09 DIAGNOSIS — T887XXA Unspecified adverse effect of drug or medicament, initial encounter: Secondary | ICD-10-CM | POA: Diagnosis not present

## 2020-07-09 DIAGNOSIS — G479 Sleep disorder, unspecified: Secondary | ICD-10-CM | POA: Diagnosis not present

## 2020-07-09 DIAGNOSIS — F329 Major depressive disorder, single episode, unspecified: Secondary | ICD-10-CM | POA: Diagnosis not present

## 2020-07-09 DIAGNOSIS — Z885 Allergy status to narcotic agent status: Secondary | ICD-10-CM | POA: Diagnosis not present

## 2020-07-09 DIAGNOSIS — T7840XA Allergy, unspecified, initial encounter: Secondary | ICD-10-CM | POA: Diagnosis not present

## 2020-07-09 DIAGNOSIS — T50905A Adverse effect of unspecified drugs, medicaments and biological substances, initial encounter: Secondary | ICD-10-CM | POA: Diagnosis not present

## 2020-07-09 DIAGNOSIS — Z853 Personal history of malignant neoplasm of breast: Secondary | ICD-10-CM | POA: Diagnosis not present

## 2020-07-09 DIAGNOSIS — T43225A Adverse effect of selective serotonin reuptake inhibitors, initial encounter: Secondary | ICD-10-CM | POA: Diagnosis not present

## 2020-07-09 LAB — T3, FREE: T3, Free: 3.1 pg/mL (ref 2.3–4.2)

## 2020-07-09 LAB — TSH: TSH: 6.31 u[IU]/mL — ABNORMAL HIGH (ref 0.35–4.50)

## 2020-07-09 LAB — T4, FREE: Free T4: 0.84 ng/dL (ref 0.60–1.60)

## 2020-07-09 NOTE — Telephone Encounter (Signed)
Left message for Renee [name] with message below from Dr. Diona Browner.  I ask that they call us back if she wants to try the venlafaxine.

## 2020-07-09 NOTE — Telephone Encounter (Signed)
I am sorry to hear she had a reaction. Please add to allergy list. We could try venlafaxine a different family of medication given the reaction?

## 2020-07-09 NOTE — Telephone Encounter (Signed)
Salena Saner (DPR signed) said pt had visit on 07/08/20 for depression and anxiety. Pt was started on Celexa which pt took first Celexa last night; pt developed throat swelling and difficulty breathing; pt was taken to [facility] ED at [facility] and was given benadryl, prednisone and pepcid; pt was sent home with prednisone rx also.Salena Saner wants to know if pt could try something different for depression and anxiety. Previously zoloft caused H/A. Salena Saner request cb after Dr Diona Browner reviews this note. Today pt does not have any swelling in mouth or throat and no difficulty breathing. Updated allergy list with Celexa. [facility] [place]

## 2020-07-10 NOTE — Telephone Encounter (Signed)
Please also call and offer counseling. I suggested it at appt but she declined in favor of medicaiton. Now that she did not tolerate the med, she may be interested.

## 2020-07-10 NOTE — Telephone Encounter (Signed)
Spoke with Ms. Stenglein.  She is a little scared at this point to try a different medication.  She wants to wait and follow up in 4 weeks as scheduled to discuss other options with Dr. Diona Browner.  She states maybe by then she will be ready to try a new medication.  She will call back and move up appointment if needed.

## 2020-07-11 NOTE — Telephone Encounter (Signed)
Renee [name] notified as instructed by telephone.  She wants to wait until she get straightened out.  She states she is feeling better and is taking the prednisone they gave her at the ED. She will think about counseling and call us back for referral if she decides that is what she wants to do.

## 2020-07-14 MED ORDER — VENLAFAXINE HCL ER 37.5 MG PO CP24: 37.5000 mg | ORAL_CAPSULE | Freq: Every day | ORAL | 0 refills | Status: DC

## 2020-07-14 NOTE — Telephone Encounter (Signed)
Reviewed ER note. Venlafaxine prescription sent in... will do low dose and stay at this level until follow up to make sure she tolerates it. Continue holding  trazodone for now as wel.

## 2020-07-14 NOTE — Telephone Encounter (Signed)
Patient's daughter Renee [name] called stating that her mom feels that the Celexa is out of her system now. Renee [name] stated that her mom is willing to try the new anti-depressant medication that Dr. Diona Browner recommended.Effexor. Renee [name] requested that the new script be sent to the pharmacy. Pharmacy [facility]

## 2020-07-14 NOTE — Addendum Note (Signed)
Addended by: Eliezer Lofts E on: 07/14/2020 03:39 PM   Modules accepted: Orders

## 2020-07-14 NOTE — Telephone Encounter (Signed)
Renee [name] notified as instructed by telephone.  Patient states understanding.  

## 2020-07-30 ENCOUNTER — Other Ambulatory Visit: Payer: Self-pay | Admitting: Family Medicine

## 2020-08-05 ENCOUNTER — Other Ambulatory Visit: Payer: Self-pay | Admitting: Family Medicine

## 2020-08-12 ENCOUNTER — Other Ambulatory Visit: Payer: Self-pay | Admitting: *Deleted

## 2020-08-12 MED ORDER — GLIPIZIDE ER 5 MG PO TB24: ORAL_TABLET | ORAL | 1 refills | Status: DC

## 2020-08-15 ENCOUNTER — Other Ambulatory Visit: Payer: Self-pay

## 2020-08-15 ENCOUNTER — Ambulatory Visit (INDEPENDENT_AMBULATORY_CARE_PROVIDER_SITE_OTHER): Payer: Medicare Other | Admitting: Family Medicine

## 2020-08-15 VITALS — BP 140/66 | HR 77 | Temp 98.1°F | Ht 63.0 in | Wt 151.0 lb

## 2020-08-15 DIAGNOSIS — F411 Generalized anxiety disorder: Secondary | ICD-10-CM | POA: Diagnosis not present

## 2020-08-15 DIAGNOSIS — F331 Major depressive disorder, recurrent, moderate: Secondary | ICD-10-CM | POA: Diagnosis not present

## 2020-08-15 NOTE — Patient Instructions (Addendum)
Wean off every other day x 1 week and then stop. Call if interested in restarting the medication. Work on exercise and get sunshine. Call if interested in referral to counselor. Call if balance issues not resolving off of venlafaxine.

## 2020-08-15 NOTE — Progress Notes (Signed)
Chief Complaint  Patient presents with  . Follow-up    follow up from new medicine, having issue with balance since starting meds     History of Present Illness: HPI   74 year old female presents for follow up depression.   MDD: at last OV started on venlafaxine low dose. She feels like the medication is causing headache and may be making her lose balance. She feels very tired... changes to bedtime.Marland Kitchen less SE. Falling asleep okay sleeping better.   Did not tolerate SSRI ( citalopram)   Less lonely feeling, less anxious. Not crying nonstop. Depression screen [facility] 2/9 08/15/2020 07/08/2020 10/05/2019  Decreased Interest 3 1 0  Down, Depressed, Hopeless 0 1 0  PHQ - 2 Score 3 2 0  Altered sleeping 3 3 -  Tired, decreased energy 3 3 -  Change in appetite 0 2 -  Feeling bad or failure about yourself  0 1 -  Trouble concentrating 0 1 -  Moving slowly or fidgety/restless 0 2 -  Suicidal thoughts 0 0 -  PHQ-9 Score 9 14 -  Difficult doing work/chores - Somewhat difficult -   This visit occurred during the SARS-CoV-2 public health emergency.  Safety protocols were in place, including screening questions prior to the visit, additional usage of staff PPE, and extensive cleaning of exam room while observing appropriate contact time as indicated for disinfecting solutions.   COVID 19 screen:  No recent travel or known exposure to COVID19 The patient denies respiratory symptoms of COVID 19 at this time. The importance of social distancing was discussed today.     Review of Systems  Constitutional: Negative for chills and fever.  HENT: Negative for congestion and ear pain.   Eyes: Negative for pain and redness.  Respiratory: Negative for cough and shortness of breath.   Cardiovascular: Negative for chest pain, palpitations and leg swelling.  Gastrointestinal: Negative for abdominal pain, blood in stool, constipation, diarrhea, nausea and vomiting.  Genitourinary: Negative for dysuria.   Musculoskeletal: Negative for falls and myalgias.  Skin: Negative for rash.  Neurological: Negative for dizziness.  Psychiatric/Behavioral: Negative for depression. The patient is not nervous/anxious.       Past Medical History:  Diagnosis Date  . Allergy   . Anxiety   . Arthritis   . CAD (coronary artery disease)    non-obstructive. cath 11/11  . Diabetes mellitus    borderline diabetic - diet controlled   . History of chicken pox   . History of phobia    clastrophobia  . Hyperlipidemia   . Hypertension   . Hypothyroidism   . Kidney stones    hx of   . Lichen sclerosus   . Psoriasis   . Urinary tract infection    hx of     reports that she has never smoked. She has never used smokeless tobacco. She reports current alcohol use. She reports that she does not use drugs.   Current Outpatient Medications:  .  ACCU-CHEK COMPACT PLUS test strip, USE AS DIRECTED TO TEST BLOOD SUGAR DAILY, Disp: 100 each, Rfl: 1 .  acetaminophen (TYLENOL) 650 MG CR tablet, One tab as needed every 8 hours, 30 days, Disp: , Rfl:  .  aspirin EC 81 MG tablet, Take 81 mg by mouth daily., Disp: , Rfl:  .  Blood Glucose Monitoring Suppl (ACCU-CHEK COMPACT CARE KIT) KIT, Meter Kit, Disp: 1 each, Rfl: 0 .  clobetasol ointment (TEMOVATE) 6.73 %, Apply 1 application topically daily as  needed (psoriasis flares)., Disp: 30 g, Rfl: 1 .  Emollient (CERAVE) CREA, Apply 1 application topically daily as needed (psoriasis). , Disp: , Rfl:  .  ezetimibe (ZETIA) 10 MG tablet, Take 1 tablet (10 mg total) by mouth daily., Disp: 90 tablet, Rfl: 3 .  Flaxseed, Linseed, (FLAX SEED OIL PO), Take by mouth., Disp: , Rfl:  .  fluticasone (FLONASE) 50 MCG/ACT nasal spray, Place 1 spray into both nostrils daily., Disp: , Rfl:  .  gemfibrozil (LOPID) 600 MG tablet, Take 1 tablet (600 mg total) by mouth every morning., Disp: 90 tablet, Rfl: 3 .  glipiZIDE (GLUCOTROL XL) 5 MG 24 hr tablet, TAKE 1 TABLET BY MOUTH EVERY DAY WITH  BREAKFAST, Disp: 90 tablet, Rfl: 1 .  ibuprofen (ADVIL,MOTRIN) 200 MG tablet, Take 200 mg by mouth every 6 (six) hours as needed for moderate pain. , Disp: , Rfl:  .  levothyroxine (SYNTHROID) 75 MCG tablet, TAKE 1 TABLET DAILY, Disp: 90 tablet, Rfl: 3 .  losartan (COZAAR) 50 MG tablet, Take 50 mg by mouth daily., Disp: , Rfl:  .  Multiple Vitamin (MULTIVITAMIN) tablet, Take 1 tablet by mouth every other day. , Disp: , Rfl:  .  nitroGLYCERIN (NITROSTAT) 0.4 MG SL tablet, Place 1 tablet (0.4 mg total) under the tongue every 5 (five) minutes as needed., Disp: 30 tablet, Rfl: 6 .  traZODone (DESYREL) 50 MG tablet, TAKE 0.5-1 TABLETS (25-50 MG TOTAL) BY MOUTH AT BEDTIME AS NEEDED FOR SLEEP., Disp: 90 tablet, Rfl: 0 .  venlafaxine XR (EFFEXOR-XR) 37.5 MG 24 hr capsule, TAKE 1 CAPSULE BY MOUTH DAILY WITH BREAKFAST., Disp: 30 capsule, Rfl: 0 .  Vitamin D, Cholecalciferol, 1000 units TABS, Take 2,000 Units by mouth every other day. , Disp: , Rfl:  .  citalopram (CELEXA) 20 MG tablet, Take 1 tablet (20 mg total) by mouth daily. (Patient not taking: Reported on 08/15/2020), Disp: 30 tablet, Rfl: 3 .  diphenhydrAMINE (BENADRYL) 25 MG tablet, Take 1 tablet (25 mg total) by mouth at bedtime as needed for sleep. (Patient not taking: Reported on 08/15/2020), Disp: 30 tablet, Rfl: 0 .  triamcinolone cream (KENALOG) 0.1 %, APPLY TO AFFECTED AREAS ON BODY TWICE DAILY TO AFFECTED AREAS AS NEEDED (Patient not taking: Reported on 08/15/2020), Disp: , Rfl:    Observations/Objective: Blood pressure 140/66, pulse 77, temperature 98.1 F (36.7 C), height $RemoveBe'5\' 3"'ZGCwbQGwc$  (1.6 m), weight 151 lb (68.5 kg), SpO2 96 %.  Physical Exam Constitutional:      General: She is not in acute distress.    Appearance: Normal appearance. She is well-developed. She is not ill-appearing or toxic-appearing.  HENT:     Head: Normocephalic.     Right Ear: Hearing, tympanic membrane, ear canal and external ear normal. Tympanic membrane is not  erythematous, retracted or bulging.     Left Ear: Hearing, tympanic membrane, ear canal and external ear normal. Tympanic membrane is not erythematous, retracted or bulging.     Nose: No mucosal edema or rhinorrhea.     Right Sinus: No maxillary sinus tenderness or frontal sinus tenderness.     Left Sinus: No maxillary sinus tenderness or frontal sinus tenderness.     Mouth/Throat:     Pharynx: Uvula midline.  Eyes:     General: Lids are normal. Lids are everted, no foreign bodies appreciated.     Conjunctiva/sclera: Conjunctivae normal.     Pupils: Pupils are equal, round, and reactive to light.  Neck:     Thyroid: No  thyroid mass or thyromegaly.     Vascular: No carotid bruit.     Trachea: Trachea normal.  Cardiovascular:     Rate and Rhythm: Normal rate and regular rhythm.     Pulses: Normal pulses.     Heart sounds: Normal heart sounds, S1 normal and S2 normal. No murmur heard.  No friction rub. No gallop.   Pulmonary:     Effort: Pulmonary effort is normal. No tachypnea or respiratory distress.     Breath sounds: Normal breath sounds. No decreased breath sounds, wheezing, rhonchi or rales.  Abdominal:     General: Bowel sounds are normal.     Palpations: Abdomen is soft.     Tenderness: There is no abdominal tenderness.  Musculoskeletal:     Cervical back: Normal range of motion and neck supple.  Skin:    General: Skin is warm and dry.     Findings: No rash.  Neurological:     Mental Status: She is alert.  Psychiatric:        Mood and Affect: Mood is not anxious or depressed.        Speech: Speech normal.        Behavior: Behavior normal. Behavior is cooperative.        Thought Content: Thought content normal.        Judgment: Judgment normal.      Assessment and Plan   MDD (major depressive disorder), recurrent episode, moderate (HCC) Improved control with mood but cannot tolerate SE of balanace issues with venlfaxine.   Wean off . She will call if she is  interested in treating a differnent med.    Eliezer Lofts, MD

## 2020-08-15 NOTE — Assessment & Plan Note (Signed)
Improved control with mood but cannot tolerate SE of balanace issues with venlfaxine.   Wean off . She will call if she is interested in treating a differnent med.

## 2020-09-04 ENCOUNTER — Other Ambulatory Visit: Payer: Self-pay | Admitting: Family Medicine

## 2020-09-22 DIAGNOSIS — Z23 Encounter for immunization: Secondary | ICD-10-CM | POA: Diagnosis not present

## 2020-09-29 DIAGNOSIS — X32XXXA Exposure to sunlight, initial encounter: Secondary | ICD-10-CM | POA: Diagnosis not present

## 2020-09-29 DIAGNOSIS — L821 Other seborrheic keratosis: Secondary | ICD-10-CM | POA: Diagnosis not present

## 2020-09-29 DIAGNOSIS — D225 Melanocytic nevi of trunk: Secondary | ICD-10-CM | POA: Diagnosis not present

## 2020-09-29 DIAGNOSIS — L400 Psoriasis vulgaris: Secondary | ICD-10-CM | POA: Diagnosis not present

## 2020-09-29 DIAGNOSIS — L570 Actinic keratosis: Secondary | ICD-10-CM | POA: Diagnosis not present

## 2020-09-29 LAB — HM DIABETES FOOT EXAM

## 2020-10-06 ENCOUNTER — Telehealth: Payer: Self-pay | Admitting: Family Medicine

## 2020-10-06 DIAGNOSIS — E559 Vitamin D deficiency, unspecified: Secondary | ICD-10-CM

## 2020-10-06 DIAGNOSIS — E039 Hypothyroidism, unspecified: Secondary | ICD-10-CM

## 2020-10-06 DIAGNOSIS — E119 Type 2 diabetes mellitus without complications: Secondary | ICD-10-CM

## 2020-10-06 NOTE — Telephone Encounter (Signed)
-----   Message from Ellamae Sia sent at 09/23/2020  3:09 PM EDT ----- Regarding: Lab orders for Tuesday, 10.11.21 Patient is scheduled for CPX labs, please order future labs, Thanks , Karna Christmas

## 2020-10-07 ENCOUNTER — Ambulatory Visit: Payer: Medicare Other

## 2020-10-07 ENCOUNTER — Ambulatory Visit (INDEPENDENT_AMBULATORY_CARE_PROVIDER_SITE_OTHER): Payer: Medicare Other

## 2020-10-07 ENCOUNTER — Other Ambulatory Visit (INDEPENDENT_AMBULATORY_CARE_PROVIDER_SITE_OTHER): Payer: Medicare Other

## 2020-10-07 ENCOUNTER — Other Ambulatory Visit: Payer: Self-pay

## 2020-10-07 DIAGNOSIS — E559 Vitamin D deficiency, unspecified: Secondary | ICD-10-CM | POA: Diagnosis not present

## 2020-10-07 DIAGNOSIS — E039 Hypothyroidism, unspecified: Secondary | ICD-10-CM | POA: Diagnosis not present

## 2020-10-07 DIAGNOSIS — E119 Type 2 diabetes mellitus without complications: Secondary | ICD-10-CM

## 2020-10-07 DIAGNOSIS — Z0000 Encounter for general adult medical examination without abnormal findings: Secondary | ICD-10-CM | POA: Diagnosis not present

## 2020-10-07 LAB — LIPID PANEL
Cholesterol: 131 mg/dL (ref 0–200)
HDL: 42.1 mg/dL (ref >39.00)
LDL Cholesterol: 76 mg/dL (ref 0–99)
NonHDL: 88.98
Total CHOL/HDL Ratio: 3
Triglycerides: 65 mg/dL (ref 0.0–149.0)
VLDL: 13 mg/dL (ref 0.0–40.0)

## 2020-10-07 LAB — COMPREHENSIVE METABOLIC PANEL
ALT: 25 U/L (ref 0–35)
AST: 28 U/L (ref 0–37)
Albumin: 4.6 g/dL (ref 3.5–5.2)
Alkaline Phosphatase: 54 U/L (ref 39–117)
BUN: 28 mg/dL — ABNORMAL HIGH (ref 6–23)
CO2: 27 mEq/L (ref 19–32)
Calcium: 9.8 mg/dL (ref 8.4–10.5)
Chloride: 103 mEq/L (ref 96–112)
Creatinine, Ser: 0.7 mg/dL (ref 0.40–1.20)
GFR: 84.1 mL/min (ref >60.00)
Glucose, Bld: 143 mg/dL — ABNORMAL HIGH (ref 70–99)
Potassium: 4.5 mEq/L (ref 3.5–5.1)
Sodium: 138 mEq/L (ref 135–145)
Total Bilirubin: 0.4 mg/dL (ref 0.2–1.2)
Total Protein: 7.5 g/dL (ref 6.0–8.3)

## 2020-10-07 LAB — T4, FREE: Free T4: 0.85 ng/dL (ref 0.60–1.60)

## 2020-10-07 LAB — VITAMIN D 25 HYDROXY (VIT D DEFICIENCY, FRACTURES): VITD: 43.32 ng/mL (ref 30.00–100.00)

## 2020-10-07 LAB — TSH: TSH: 2.85 u[IU]/mL (ref 0.35–4.50)

## 2020-10-07 LAB — HEMOGLOBIN A1C: Hgb A1c MFr Bld: 7.9 % — ABNORMAL HIGH (ref 4.6–6.5)

## 2020-10-07 LAB — T3, FREE: T3, Free: 3.3 pg/mL (ref 2.3–4.2)

## 2020-10-07 NOTE — Progress Notes (Signed)
Subjective:   Renee [name] is a 74 y.o. female who presents for Medicare Annual (Subsequent) preventive examination.  Review of Systems: N/A      I connected with the patient today by telephone and verified that I am speaking with the correct person using two identifiers. Location patient: home Location nurse: work Persons participating in the telephone visit: patient, nurse.   I discussed the limitations, risks, security and privacy concerns of performing an evaluation and management service by telephone and the availability of in person appointments. I also discussed with the patient that there may be a patient responsible charge related to this service. The patient expressed understanding and verbally consented to this telephonic visit.        Cardiac Risk Factors include: advanced age (>30men, >32 women);diabetes mellitus;hypertension     Objective:    Today's Vitals   10/07/20 1536  PainSc: 5    There is no height or weight on file to calculate BMI.  Advanced Directives 10/07/2020 01/04/2020 09/25/2018 09/19/2018 06/06/2018 03/28/2018 09/20/2017  Does Patient Have a Medical Advance Directive? Yes No No No No No No  Type of Paramedic of [place];Living will - - - - - -  Does patient want to make changes to medical advance directive? - - - - - - -  Copy of [place] in Chart? No - copy requested - - - - - -  Would patient like information on creating a medical advance directive? - - No - Patient declined No - Patient declined No - Patient declined No - Patient declined Yes (MAU/Ambulatory/Procedural Areas - Information given)    Current Medications (verified) Outpatient Encounter Medications as of 10/07/2020  Medication Sig  . ACCU-CHEK COMPACT PLUS test strip USE AS DIRECTED TO TEST BLOOD SUGAR DAILY  . acetaminophen (TYLENOL) 650 MG CR tablet One tab as needed every 8 hours, 30 days  . aspirin EC 81 MG tablet Take 81 mg by mouth  daily.  . Blood Glucose Monitoring Suppl (ACCU-CHEK COMPACT CARE KIT) KIT Meter Kit  . clobetasol ointment (TEMOVATE) 9.51 % Apply 1 application topically daily as needed (psoriasis flares).  . Emollient (CERAVE) CREA Apply 1 application topically daily as needed (psoriasis).   . ezetimibe (ZETIA) 10 MG tablet Take 1 tablet (10 mg total) by mouth daily.  . Flaxseed, Linseed, (FLAX SEED OIL PO) Take by mouth.  . fluticasone (FLONASE) 50 MCG/ACT nasal spray Place 1 spray into both nostrils daily.  Marland Kitchen gemfibrozil (LOPID) 600 MG tablet Take 1 tablet (600 mg total) by mouth every morning.  Marland Kitchen glipiZIDE (GLUCOTROL XL) 5 MG 24 hr tablet TAKE 1 TABLET BY MOUTH EVERY DAY WITH BREAKFAST  . ibuprofen (ADVIL,MOTRIN) 200 MG tablet Take 200 mg by mouth every 6 (six) hours as needed for moderate pain.   Marland Kitchen levothyroxine (SYNTHROID) 75 MCG tablet TAKE 1 TABLET DAILY  . losartan (COZAAR) 50 MG tablet Take 50 mg by mouth daily.  . Multiple Vitamin (MULTIVITAMIN) tablet Take 1 tablet by mouth every other day.   . nitroGLYCERIN (NITROSTAT) 0.4 MG SL tablet Place 1 tablet (0.4 mg total) under the tongue every 5 (five) minutes as needed.  . traZODone (DESYREL) 50 MG tablet TAKE 0.5-1 TABLETS (25-50 MG TOTAL) BY MOUTH AT BEDTIME AS NEEDED FOR SLEEP.  Marland Kitchen venlafaxine XR (EFFEXOR-XR) 37.5 MG 24 hr capsule TAKE 1 CAPSULE BY MOUTH DAILY WITH BREAKFAST.  Marland Kitchen Vitamin D, Cholecalciferol, 1000 units TABS Take 2,000 Units by mouth  every other day.   . citalopram (CELEXA) 20 MG tablet Take 1 tablet (20 mg total) by mouth daily. (Patient not taking: Reported on 08/15/2020)  . diphenhydrAMINE (BENADRYL) 25 MG tablet Take 1 tablet (25 mg total) by mouth at bedtime as needed for sleep. (Patient not taking: Reported on 08/15/2020)  . triamcinolone cream (KENALOG) 0.1 % APPLY TO AFFECTED AREAS ON BODY TWICE DAILY TO AFFECTED AREAS AS NEEDED (Patient not taking: Reported on 08/15/2020)   No facility-administered encounter medications on file as  of 10/07/2020.    Allergies (verified) Other, Celexa [citalopram], Codeine, Metformin, Robaxin [methocarbamol], and Simvastatin   History: Past Medical History:  Diagnosis Date  . Allergy   . Anxiety   . Arthritis   . CAD (coronary artery disease)    non-obstructive. cath 11/11  . Diabetes mellitus    borderline diabetic - diet controlled   . History of chicken pox   . History of phobia    clastrophobia  . Hyperlipidemia   . Hypertension   . Hypothyroidism   . Kidney stones    hx of   . Lichen sclerosus   . Psoriasis   . Urinary tract infection    hx of    Past Surgical History:  Procedure Laterality Date  . APPENDECTOMY    . CARDIAC CATHETERIZATION     2011  . CARPAL TUNNEL RELEASE     Left trigger finger  . CATARACT EXTRACTION W/PHACO Left 06/06/2018   Procedure: CATARACT EXTRACTION PHACO AND INTRAOCULAR LENS PLACEMENT (IOC);  Surgeon: Birder Robson, MD;  Location: [facility] ORS;  Service: Ophthalmology;  Laterality: Left;  [place]  00:23 AP% 16.0 CDE 3.69 Fluid pack lot # [id number]  . CATARACT EXTRACTION W/PHACO Right 09/19/2018   Procedure: CATARACT EXTRACTION PHACO AND INTRAOCULAR LENS PLACEMENT (IOC);  Surgeon: Birder Robson, MD;  Location: [facility] ORS;  Service: Ophthalmology;  Laterality: Right;  [place] 00:29.5 AP% 17.0 CDE$ 5.03 Fluid pack lot # [id number]  . COLONOSCOPY WITH PROPOFOL N/A 05/13/2017   Procedure: COLONOSCOPY WITH PROPOFOL;  Surgeon: Manya Silvas, MD;  Location[facility] ENDOSCOPY;  Service: Endoscopy;  Laterality: N/A;  . ESOPHAGOGASTRODUODENOSCOPY (EGD) WITH PROPOFOL N/A 01/04/2020   Procedure: ESOPHAGOGASTRODUODENOSCOPY (EGD) WITH PROPOFOL;  Surgeon: [name], MD;  Location: [facility] ENDOSCOPY;  Service: Gastroenterology;  Laterality: N/A;  . EYE SURGERY  2004   lasik - right eye   . JOINT REPLACEMENT    . KNEE SURGERY Right 2015   arthroscopy  . TOTAL KNEE ARTHROPLASTY Bilateral 06/12/2015   Procedure: TOTAL KNEE BILATERAL;  Surgeon: Gaynelle Arabian,  MD;  Location: [facility] ORS;  Service: Orthopedics;  Laterality: Bilateral;  and epidural  . TUBAL LIGATION    . Tubes tied     and untied. and tied again   Family History  Problem Relation Age of Onset  . COPD Mother   . Diabetes Mother   . Hypertension Mother   . Hypertension Father   . Diabetes Sister   . Hypertension Sister   . Diabetes Brother   . Arthritis Brother   . Heart disease Brother   . Arthritis Brother   . Diabetes Brother   . Heart disease Brother   . Stomach cancer Maternal Grandfather   . Stroke Maternal Grandfather   . Breast cancer Daughter 66       Double mastectomy   Social History   Socioeconomic History  . Marital status: Divorced    Spouse name: Not on file  . Number of children: 6  .  Years of education: Not on file  . Highest education level: Not on file  Occupational History  . Occupation: Retired  Tobacco Use  . Smoking status: Never Smoker  . Smokeless tobacco: Never Used  Vaping Use  . Vaping Use: Never used  Substance and Sexual Activity  . Alcohol use: Yes    Alcohol/week: 0.0 standard drinks    Comment: occasionally  . Drug use: No  . Sexual activity: Not Currently    Birth control/protection: Post-menopausal  Other Topics Concern  . Not on file  Social History Narrative   Regular exercise.    Divorced   Retired at [employer] in [employer], Loss adjuster, chartered.   Social Determinants of Health   Financial Resource Strain: Low Risk   . Difficulty of Paying Living Expenses: Not hard at all  Food Insecurity: No Food Insecurity  . Worried About Charity fundraiser in the Last Year: Never true  . Ran Out of Food in the Last Year: Never true  Transportation Needs: No Transportation Needs  . Lack of Transportation (Medical): No  . Lack of Transportation (Non-Medical): No  Physical Activity: Inactive  . Days of Exercise per Week: 0 days  . Minutes of Exercise per Session: 0 min  Stress: Stress Concern Present  . Feeling of Stress : To some extent    Social Connections:   . Frequency of Communication with Friends and Family: Not on file  . Frequency of Social Gatherings with Friends and Family: Not on file  . Attends Religious Services: Not on file  . Active Member of Clubs or Organizations: Not on file  . Attends Archivist Meetings: Not on file  . Marital Status: Not on file    Tobacco Counseling Counseling given: Not Answered   Clinical Intake:  Pre-visit preparation completed: Yes  Pain : 0-10 Pain Score: 5  Pain Type: Chronic pain Pain Location: Arm (both legs) Pain Orientation: Left, Right Pain Descriptors / Indicators: Aching Pain Onset: More than a month ago Pain Frequency: Constant     Nutritional Risks: None Diabetes: Yes CBG done?: No Did pt. bring in CBG monitor from home?: No  How often do you need to have someone help you when you read instructions, pamphlets, or other written materials from your doctor or pharmacy?: 1 - Never  Diabetic: Yes Nutrition Risk Assessment:  Has the patient had any N/V/D within the last 2 months?  No  Does the patient have any non-healing wounds?  No  Has the patient had any unintentional weight loss or weight gain?  No   Diabetes:  Is the patient diabetic?  Yes  If diabetic, was a CBG obtained today?  No  Did the patient bring in their glucometer from home?  No  How often do you monitor your CBG's? When needed.   Financial Strains and Diabetes Management:  Are you having any financial strains with the device, your supplies or your medication? No .  Does the patient want to be seen by Chronic Care Management for management of their diabetes?  No  Would the patient like to be referred to a Nutritionist or for Diabetic Management?  No   Diabetic Exams:  Diabetic Eye Exam: Completed 03/20/2020 Diabetic Foot Exam: Overdue, Pt has been advised about the importance in completing this exam. Pt is scheduled for diabetic foot exam on 10/14/2020.   Interpreter  Needed?: No  Information entered by :: CJohnson, LPN   Activities of Daily Living In your present state of  health, do you have any difficulty performing the following activities: 10/07/2020  Hearing? N  Vision? N  Difficulty concentrating or making decisions? N  Walking or climbing stairs? N  Dressing or bathing? N  Doing errands, shopping? N  Preparing Food and eating ? N  Using the Toilet? N  In the past six months, have you accidently leaked urine? N  Do you have problems with loss of bowel control? N  Managing your Medications? N  Managing your Finances? N  Housekeeping or managing your Housekeeping? N  Some recent data might be hidden    Patient Care Team: Jinny Sanders, MD as PCP - General (Family Medicine) Gaynelle Arabian, MD as Consulting Physician (Orthopedic Surgery) Minna Merritts, MD as Consulting Physician (Cardiology) Dasher, Rayvon Char, MD as Consulting Physician (Dermatology) Birder Robson, MD as Referring Physician (Ophthalmology) Debbora Dus, [facility] as Pharmacist (Pharmacist)  Indicate any recent Medical Services you may have received from other than [facility] providers in the past year (date may be approximate).     Assessment:   This is a routine wellness examination for Renee [name].  Hearing/Vision screen  Hearing Screening   '125Hz'$  $Remo'250Hz'tPOrv$'500Hz'$'1000Hz'$'2000Hz'$'3000Hz'$'4000Hz'$'6000Hz'$'8000Hz'$   Right ear:           Left ear:           Vision Screening Comments: Patient gets annual eye exams   Dietary issues and exercise activities discussed: Current Exercise Habits: The patient does not participate in regular exercise at present, Exercise limited by: None identified  Goals    . Patient Stated     Starting 09/25/2018, I will continue to take medications as prescribed.     . Patient Stated     10/07/2020, I will maintain and continue medications as prescribed.    . Pharmacy Care Plan     CARE PLAN ENTRY  Current Barriers:  . Chronic Disease Management  support, education, and care coordination needs related to Hypertension, Hyperlipidemia, and Diabetes   Hypertension . Pharmacist Clinical Goal(s): o Over the next 6 months, patient will work with PharmD and providers to achieve BP goal <140/90 mmHg . Current regimen:   Losartan 50 mg - 1 tablet daily  . Interventions: o Begin checking home blood pressure once weekly . Patient self care activities - Over the next 6 months, patient will: o Check blood pressure weekly, document, and provide at future appointment - call if blood pressure is above 140/90 mmHg o  If possible, purchase Omron home blood pressure monitor and check blood pressure before breakfast and before bedtime for 7 days prior to next visit. o Ensure daily salt intake < 2300 mg/day  Hyperlipidemia . Pharmacist Clinical Goal(s): o Over the next 6 months, patient will work with PharmD and providers to achieve LDL goal < 70 . Current regimen:  . Ezetimibe 10 mg - 1 tablet daily  . Gemfibrozil 600 mg - 1 tablet every morning  . Failed therapies: statins  . Interventions: o Continue current medications, dietary modifications and exercise  . Patient self care activities - Over the next 6 months, patient will: o Continue medications as prescribed o Slowly increase exercise with goal of 30 minutes, 5 days per week o Incorporate a healthy diet high in vegetables, fruits and whole grains with low-fat dairy products, chicken, fish, legumes, non-tropical vegetable oils and nuts. Limit intake of sweets, sugar-sweetened beverages and red meats.  Diabetes . Pharmacist Clinical Goal(s): o Over the next 6 months, patient  will work with PharmD and providers to maintain A1c goal < 7.5% . Current regimen:   Glipizide 5 mg XL - 1 tablet daily . Interventions: o Continue current medications  . Patient self care activities - Over the next 6 months, patient will: o Check blood sugar with symptoms of low blood sugar, document, and provide  at future appointments o Contact provider with any episodes of hypoglycemia (blood sugar < 70 mg/dL)  Additional recommendations: . To reduce cost - try regular Tylenol 325 mg 2 tablets every 6 hours as needed instead of Tylenol Arthritis 650 mg   Initial goal documentation      Depression Screen PHQ 2/9 Scores 10/07/2020 08/15/2020 07/08/2020 10/05/2019 09/25/2018 09/20/2017 07/30/2016  PHQ - 2 Score $Remov'4 3 2 'hHsfBN$ 0 0 0 0  PHQ- 9 Score $Remov'4 9 14 'XbXKRe$ - 0 0 -    Fall Risk Fall Risk  10/07/2020 10/05/2019 09/25/2018 09/20/2017 07/30/2016  Falls in the past year? 1 0 No No No  Number falls in past yr: 1 - - - -  Injury with Fall? 0 - - - -  Risk for fall due to : Medication side effect - - - -  Follow up Falls evaluation completed;Falls prevention discussed - - - -    Any stairs in or around the home? Yes  If so, are there any without handrails? No  Home free of loose throw rugs in walkways, pet beds, electrical cords, etc? Yes  Adequate lighting in your home to reduce risk of falls? Yes   ASSISTIVE DEVICES UTILIZED TO PREVENT FALLS:  Life alert? No  Use of a cane, walker or w/c? No  Grab bars in the bathroom? No  Shower chair or bench in shower? No  Elevated toilet seat or a handicapped toilet? No   TIMED UP AND GO:  Was the test performed? N/A, telephonic visit .    Cognitive Function: MMSE - Mini Mental State Exam 10/07/2020 09/25/2018 09/20/2017 07/30/2016  Orientation to time $Remov'5 5 5 5  'DWIlTH$ Orientation to Place $Remove'5 5 5 5  'McXTcGr$ Registration $Remov'3 3 3 3  'IDpEbT$ Attention/ Calculation 5 0 0 0  Recall $Remov'3 3 3 3  'llsEPg$ Language- name 2 objects - 0 0 0  Language- repeat $RemoveBeforeDE'1 1 1 1  'HVKnynUTDueWbAQ$ Language- follow 3 step command - $RemoveB'3 3 3  'QntlDFZD$ Language- read & follow direction - 0 0 0  Write a sentence - 0 0 0  Copy design - 0 0 0  Total score - $Remov'20 20 20  'AHeCOt$ Mini Cog  Mini-Cog screen was completed. Maximum score is 22. A value of 0 denotes this part of the MMSE was not completed or the patient failed this part of the Mini-Cog screening.        Immunizations Immunization History  Administered Date(s) Administered  . H1N1 11/02/2008  . Influenza Split 10/22/2006, 10/28/2007, 09/26/2008, 09/13/2009, 09/12/2010, 09/18/2011, 09/27/2012  . Influenza, High Dose Seasonal PF 09/11/2016, 08/28/2019, 09/22/2020  . Influenza,inj,Quad PF,6+ Mos 09/13/2013, 09/13/2014, 09/20/2017, 09/25/2018  . Influenza-Unspecified 08/27/2014, 09/18/2015  . PFIZER SARS-COV-2 Vaccination 01/03/2020, 01/26/2020  . Pneumococcal Conjugate-13 04/05/2014  . Pneumococcal Polysaccharide-23 09/26/2008, 02/08/2017  . Tdap 09/27/2012    TDAP status: Up to date Flu Vaccine status: Up to date Pneumococcal vaccine status: Up to date Covid-19 vaccine status: Completed vaccines  Qualifies for Shingles Vaccine? Yes   Zostavax completed No   Shingrix Completed?: No.    Education has been provided regarding the importance of this vaccine. Patient has been advised to  call insurance company to determine out of pocket expense if they have not yet received this vaccine. Advised may also receive vaccine at local pharmacy or Health Dept. Verbalized acceptance and understanding.  Screening Tests Health Maintenance  Topic Date Due  . MAMMOGRAM  12/07/2019  . FOOT EXAM  10/04/2020  . OPHTHALMOLOGY EXAM  03/20/2021  . HEMOGLOBIN A1C  04/07/2021  . TETANUS/TDAP  09/27/2022  . INFLUENZA VACCINE  Completed  . DEXA SCAN  Completed  . COVID-19 Vaccine  Completed  . Hepatitis C Screening  Completed  . PNA vac Low Risk Adult  Completed    Health Maintenance  Health Maintenance Due  Topic Date Due  . MAMMOGRAM  12/07/2019  . FOOT EXAM  10/04/2020    Colorectal cancer screening: Completed 05/13/2017. Repeat every 5 years Mammogram status:due, Patient will schedule appointment Bone Density status: Completed 12/02/2017. Results reflect: Bone density results: OSTEOPENIA. Repeat every 2-5 years.  Lung Cancer Screening: (Low Dose CT Chest recommended if Age 8-80 years, 30  pack-year currently smoking OR have quit w/in 15 years.) does not qualify.    Additional Screening:  Hepatitis C Screening: does qualify; Completed 07/30/2016  Vision Screening: Recommended annual ophthalmology exams for early detection of glaucoma and other disorders of the eye. Is the patient up to date with their annual eye exam?  Yes  Who is the provider or what is the name of the office in which the patient attends annual eye exams? Dr. Linton Flemings If pt is not established with a provider, would they like to be referred to a provider to establish care? No .   Dental Screening: Recommended annual dental exams for proper oral hygiene  Community Resource Referral / Chronic Care Management: CRR required this visit?  No   CCM required this visit?  No      Plan:     I have personally reviewed and noted the following in the patient's chart:   . Medical and social history . Use of alcohol, tobacco or illicit drugs  . Current medications and supplements . Functional ability and status . Nutritional status . Physical activity . Advanced directives . List of other physicians . Hospitalizations, surgeries, and ER visits in previous 12 months . Vitals . Screenings to include cognitive, depression, and falls . Referrals and appointments  In addition, I have reviewed and discussed with patient certain preventive protocols, quality metrics, and best practice recommendations. A written personalized care plan for preventive services as well as general preventive health recommendations were provided to patient.   Due to this being a telephonic visit, the after visit summary with patients personalized plan was offered to patient via office or my-chart.  Patient preferred to pick up at office at next visit or via mychart.   Andrez Grime, LPN   [id number]

## 2020-10-07 NOTE — Progress Notes (Signed)
No critical labs need to be addressed urgently. We will discuss labs in detail at upcoming office visit.   

## 2020-10-07 NOTE — Progress Notes (Signed)
PCP notes:  Health Maintenance: Mammogram- due Foot exam- due   Abnormal Screenings: none   Patient concerns: none   Nurse concerns: none   Next PCP appt.: 10/15/2019 @ 2:20 pm

## 2020-10-07 NOTE — Patient Instructions (Signed)
Renee [name] , Thank you for taking time to come for your Medicare Wellness Visit. I appreciate your ongoing commitment to your health goals. Please review the following plan we discussed and let me know if I can assist you in the future.   Screening recommendations/referrals: Colonoscopy: Up to date, completed 05/13/2017, due 04/2022 Mammogram: due, you will schedule appointment Bone Density: Up to date, completed 12/02/2017, due 2-5 years  Recommended yearly ophthalmology/optometry visit for glaucoma screening and checkup Recommended yearly dental visit for hygiene and checkup  Vaccinations: Influenza vaccine: Up to date, completed 09/22/2020, due 07/2021 Pneumococcal vaccine: Completed series Tdap vaccine: Up to date, completed 09/27/2012, due 09/2022 Shingles vaccine: due, check with your insurance regarding coverage    Covid-19:Completed series  Advanced directives: Please bring a copy of your POA (Power of Attorney) and/or Living Will to your next appointment.   Conditions/risks identified: diabetes, hypertension  Next appointment: Follow up in one year for your annual wellness visit    Preventive Care 76 Years and Older, Female Preventive care refers to lifestyle choices and visits with your health care provider that can promote health and wellness. What does preventive care include?  A yearly physical exam. This is also called an annual well check.  Dental exams once or twice a year.  Routine eye exams. Ask your health care provider how often you should have your eyes checked.  Personal lifestyle choices, including:  Daily care of your teeth and gums.  Regular physical activity.  Eating a healthy diet.  Avoiding tobacco and drug use.  Limiting alcohol use.  Practicing safe sex.  Taking low-dose aspirin every day.  Taking vitamin and mineral supplements as recommended by your health care provider. What happens during an annual well check? The services and screenings  done by your health care provider during your annual well check will depend on your age, overall health, lifestyle risk factors, and family history of disease. Counseling  Your health care provider may ask you questions about your:  Alcohol use.  Tobacco use.  Drug use.  Emotional well-being.  Home and relationship well-being.  Sexual activity.  Eating habits.  History of falls.  Memory and ability to understand (cognition).  Work and work Statistician.  Reproductive health. Screening  You may have the following tests or measurements:  Height, weight, and BMI.  Blood pressure.  Lipid and cholesterol levels. These may be checked every 5 years, or more frequently if you are over 32 years old.  Skin check.  Lung cancer screening. You may have this screening every year starting at age 45 if you have a 30-pack-year history of smoking and currently smoke or have quit within the past 15 years.  Fecal occult blood test (FOBT) of the stool. You may have this test every year starting at age 81.  Flexible sigmoidoscopy or colonoscopy. You may have a sigmoidoscopy every 5 years or a colonoscopy every 10 years starting at age 43.  Hepatitis C blood test.  Hepatitis B blood test.  Sexually transmitted disease (STD) testing.  Diabetes screening. This is done by checking your blood sugar (glucose) after you have not eaten for a while (fasting). You may have this done every 1-3 years.  Bone density scan. This is done to screen for osteoporosis. You may have this done starting at age 6.  Mammogram. This may be done every 1-2 years. Talk to your health care provider about how often you should have regular mammograms. Talk with your health care provider about  your test results, treatment options, and if necessary, the need for more tests. Vaccines  Your health care provider may recommend certain vaccines, such as:  Influenza vaccine. This is recommended every year.  Tetanus,  diphtheria, and acellular pertussis (Tdap, Td) vaccine. You may need a Td booster every 10 years.  Zoster vaccine. You may need this after age 32.  Pneumococcal 13-valent conjugate (PCV13) vaccine. One dose is recommended after age 30.  Pneumococcal polysaccharide (PPSV23) vaccine. One dose is recommended after age 31. Talk to your health care provider about which screenings and vaccines you need and how often you need them. This information is not intended to replace advice given to you by your health care provider. Make sure you discuss any questions you have with your health care provider. Document Released: 01/09/2016 Document Revised: 09/01/2016 Document Reviewed: 10/14/2015 Elsevier Interactive Patient Education  2017 [place] Prevention in the Home Falls can cause injuries. They can happen to people of all ages. There are many things you can do to make your home safe and to help prevent falls. What can I do on the outside of my home?  Regularly fix the edges of walkways and driveways and fix any cracks.  Remove anything that might make you trip as you walk through a door, such as a raised step or threshold.  Trim any bushes or trees on the path to your home.  Use bright outdoor lighting.  Clear any walking paths of anything that might make someone trip, such as rocks or tools.  Regularly check to see if handrails are loose or broken. Make sure that both sides of any steps have handrails.  Any raised decks and porches should have guardrails on the edges.  Have any leaves, snow, or ice cleared regularly.  Use sand or salt on walking paths during winter.  Clean up any spills in your garage right away. This includes oil or grease spills. What can I do in the bathroom?  Use night lights.  Install grab bars by the toilet and in the tub and shower. Do not use towel bars as grab bars.  Use non-skid mats or decals in the tub or shower.  If you need to sit down in  the shower, use a plastic, non-slip stool.  Keep the floor dry. Clean up any water that spills on the floor as soon as it happens.  Remove soap buildup in the tub or shower regularly.  Attach bath mats securely with double-sided non-slip rug tape.  Do not have throw rugs and other things on the floor that can make you trip. What can I do in the bedroom?  Use night lights.  Make sure that you have a light by your bed that is easy to reach.  Do not use any sheets or blankets that are too big for your bed. They should not hang down onto the floor.  Have a firm chair that has side arms. You can use this for support while you get dressed.  Do not have throw rugs and other things on the floor that can make you trip. What can I do in the kitchen?  Clean up any spills right away.  Avoid walking on wet floors.  Keep items that you use a lot in easy-to-reach places.  If you need to reach something above you, use a strong step stool that has a grab bar.  Keep electrical cords out of the way.  Do not use floor polish or wax  that makes floors slippery. If you must use wax, use non-skid floor wax.  Do not have throw rugs and other things on the floor that can make you trip. What can I do with my stairs?  Do not leave any items on the stairs.  Make sure that there are handrails on both sides of the stairs and use them. Fix handrails that are broken or loose. Make sure that handrails are as long as the stairways.  Check any carpeting to make sure that it is firmly attached to the stairs. Fix any carpet that is loose or worn.  Avoid having throw rugs at the top or bottom of the stairs. If you do have throw rugs, attach them to the floor with carpet tape.  Make sure that you have a light switch at the top of the stairs and the bottom of the stairs. If you do not have them, ask someone to add them for you. What else can I do to help prevent falls?  Wear shoes that:  Do not have high  heels.  Have rubber bottoms.  Are comfortable and fit you well.  Are closed at the toe. Do not wear sandals.  If you use a stepladder:  Make sure that it is fully opened. Do not climb a closed stepladder.  Make sure that both sides of the stepladder are locked into place.  Ask someone to hold it for you, if possible.  Clearly mark and make sure that you can see:  Any grab bars or handrails.  First and last steps.  Where the edge of each step is.  Use tools that help you move around (mobility aids) if they are needed. These include:  Canes.  Walkers.  Scooters.  Crutches.  Turn on the lights when you go into a dark area. Replace any light bulbs as soon as they burn out.  Set up your furniture so you have a clear path. Avoid moving your furniture around.  If any of your floors are uneven, fix them.  If there are any pets around you, be aware of where they are.  Review your medicines   your doctor. Some medicines can make you feel dizzy. This can increase your chance of falling. Ask your doctor what other things that you can do to help prevent falls. This information is not intended to replace advice given to you by your health care provider. Make sure you discuss any questions you have with your health care provider. Document Released: 10/09/2009 Document Revised: 05/20/2016 Document Reviewed: 01/17/2015 Elsevier Interactive Patient Education  2017 [employer].

## 2020-10-14 ENCOUNTER — Other Ambulatory Visit: Payer: Self-pay

## 2020-10-14 ENCOUNTER — Ambulatory Visit (INDEPENDENT_AMBULATORY_CARE_PROVIDER_SITE_OTHER): Payer: Medicare Other | Admitting: Family Medicine

## 2020-10-14 ENCOUNTER — Encounter: Payer: Self-pay | Admitting: Family Medicine

## 2020-10-14 VITALS — BP 140/68 | HR 90 | Temp 97.8°F | Ht 63.0 in | Wt 154.0 lb

## 2020-10-14 DIAGNOSIS — E1159 Type 2 diabetes mellitus with other circulatory complications: Secondary | ICD-10-CM

## 2020-10-14 DIAGNOSIS — F331 Major depressive disorder, recurrent, moderate: Secondary | ICD-10-CM | POA: Diagnosis not present

## 2020-10-14 DIAGNOSIS — E119 Type 2 diabetes mellitus without complications: Secondary | ICD-10-CM | POA: Diagnosis not present

## 2020-10-14 DIAGNOSIS — E785 Hyperlipidemia, unspecified: Secondary | ICD-10-CM | POA: Diagnosis not present

## 2020-10-14 DIAGNOSIS — E7800 Pure hypercholesterolemia, unspecified: Secondary | ICD-10-CM

## 2020-10-14 DIAGNOSIS — E1169 Type 2 diabetes mellitus with other specified complication: Secondary | ICD-10-CM | POA: Diagnosis not present

## 2020-10-14 DIAGNOSIS — I152 Hypertension secondary to endocrine disorders: Secondary | ICD-10-CM

## 2020-10-14 DIAGNOSIS — E039 Hypothyroidism, unspecified: Secondary | ICD-10-CM

## 2020-10-14 DIAGNOSIS — Z0000 Encounter for general adult medical examination without abnormal findings: Secondary | ICD-10-CM | POA: Diagnosis not present

## 2020-10-14 DIAGNOSIS — M791 Myalgia, unspecified site: Secondary | ICD-10-CM | POA: Diagnosis not present

## 2020-10-14 MED ORDER — GLIPIZIDE ER 5 MG PO TB24
ORAL_TABLET | ORAL | 1 refills | Status: DC
Start: 2020-10-14 — End: 2021-05-08

## 2020-10-14 MED ORDER — FREESTYLE LIBRE 14 DAY SENSOR MISC: 3 refills | Status: DC

## 2020-10-14 MED ORDER — FREESTYLE LIBRE 14 DAY READER DEVI: 0 refills | Status: DC

## 2020-10-14 MED ORDER — GEMFIBROZIL 600 MG PO TABS: 600.0000 mg | ORAL_TABLET | Freq: Every morning | ORAL | 3 refills | Status: DC

## 2020-10-14 MED ORDER — LEVOTHYROXINE SODIUM 75 MCG PO TABS
ORAL_TABLET | ORAL | 3 refills | Status: DC
Start: 2020-10-14 — End: 2021-10-11

## 2020-10-14 MED ORDER — LOSARTAN POTASSIUM 50 MG PO TABS
50.0000 mg | ORAL_TABLET | Freq: Every day | ORAL | 3 refills | Status: DC
Start: 2020-10-14 — End: 2021-10-11

## 2020-10-14 MED ORDER — CLOBETASOL PROPIONATE 0.05 % EX OINT
1.0000 | TOPICAL_OINTMENT | Freq: Every day | CUTANEOUS | 1 refills | Status: DC | PRN
Start: 2020-10-14 — End: 2021-05-05

## 2020-10-14 MED ORDER — EZETIMIBE 10 MG PO TABS
10.0000 mg | ORAL_TABLET | Freq: Every day | ORAL | 3 refills | Status: DC
Start: 2020-10-14 — End: 2020-12-08

## 2020-10-14 NOTE — Assessment & Plan Note (Signed)
Stable on levothyroxine.

## 2020-10-14 NOTE — Progress Notes (Addendum)
Chief Complaint  Patient presents with  . Annual Exam  . Leg Pain    History of Present Illness: HPI   The patient presents for  review of chronic health problems. He/She also has the following acute concerns today:  She is having bilateral arm pain, ache.. more in muscle than joint.  Also lower legs are achy and sore.  No joint swelling or redness.  Ongoing x 6 months.  Using tylenol and voltaren gel.. this helps some.  She wonders if it is her cholesterol medication.  The patient saw a LPN or RN for medicare wellness visit.  Prevention and wellness was reviewed in detail. Note reviewed and important notes copied below.  Health Maintenance: Mammogram- due Foot exam- due Abnormal Screenings: none   10/14/20  Diabetes:   Inadequate control on glucotrol Xl 5 mg daily. Lab Results  Component Value Date   HGBA1C 7.9 (H) 10/07/2020  Using medications without difficulties: Hypoglycemic episodes: Hyperglycemic episodes: Feet problems:no ulcers Blood Sugars averaging: not checking eye exam within last year: yes  Hypertension:   BP Readings from Last 3 Encounters:  10/14/20 140/68  08/15/20 140/66  07/08/20 140/70  Using medication without problems or lightheadedness: none Chest pain with exertion: none Edema:none Short of breath:none Average home BPs: Other issues:  Hypothyroid  Stable on levothyroxine. Lab Results  Component Value Date   TSH 2.85 10/07/2020    Elevated Cholesterol:  Good control on  zetia and gemfibozil. Lab Results  Component Value Date   CHOL 131 10/07/2020   HDL 42.10 10/07/2020   LDLCALC 76 10/07/2020   TRIG 65.0 10/07/2020   CHOLHDL 3 10/07/2020  Using medications without problems: Muscle aches:  Diet compliance: moderate Exercise: minimal Other complaints:  MDD  Moderate control on  No med. Stopped citalopram and venlafaxine for SE.   Clinical Support from 10/07/2020 in [place] [facility] Total Score  4     Chronic insomnia stable on trazodone.   This visit occurred during the SARS-CoV-2 public health emergency.  Safety protocols were in place, including screening questions prior to the visit, additional usage of staff PPE, and extensive cleaning of exam room while observing appropriate contact time as indicated for disinfecting solutions.   COVID 19 screen:  No recent travel or known exposure to COVID19 The patient denies respiratory symptoms of COVID 19 at this time. The importance of social distancing was discussed today.     Review of Systems  Constitutional: Negative for chills and fever.  HENT: Negative for congestion and ear pain.   Eyes: Negative for pain and redness.  Respiratory: Negative for cough and shortness of breath.   Cardiovascular: Negative for chest pain, palpitations and leg swelling.  Gastrointestinal: Negative for abdominal pain, blood in stool, constipation, diarrhea, nausea and vomiting.  Genitourinary: Negative for dysuria.  Musculoskeletal: Negative for falls and myalgias.  Skin: Negative for rash.  Neurological: Negative for dizziness.  Psychiatric/Behavioral: Negative for depression. The patient is not nervous/anxious.       Past Medical History:  Diagnosis Date  . Allergy   . Anxiety   . Arthritis   . CAD (coronary artery disease)    non-obstructive. cath 11/11  . Diabetes mellitus    borderline diabetic - diet controlled   . History of chicken pox   . History of phobia    clastrophobia  . Hyperlipidemia   . Hypertension   . Hypothyroidism   . Kidney stones    hx of   .  Lichen sclerosus   . Psoriasis   . Urinary tract infection    hx of     reports that she has never smoked. She has never used smokeless tobacco. She reports current alcohol use. She reports that she does not use drugs.   Current Outpatient Medications:  .  ACCU-CHEK COMPACT PLUS test strip, USE AS DIRECTED TO TEST BLOOD SUGAR DAILY, Disp: 100 each, Rfl: 1 .   acetaminophen (TYLENOL) 650 MG CR tablet, One tab as needed every 8 hours, 30 days, Disp: , Rfl:  .  aspirin EC 81 MG tablet, Take 81 mg by mouth daily., Disp: , Rfl:  .  Blood Glucose Monitoring Suppl (ACCU-CHEK COMPACT CARE KIT) KIT, Meter Kit, Disp: 1 each, Rfl: 0 .  clobetasol ointment (TEMOVATE) 6.60 %, Apply 1 application topically daily as needed (psoriasis flares)., Disp: 30 g, Rfl: 1 .  diphenhydrAMINE (BENADRYL) 25 MG tablet, Take 1 tablet (25 mg total) by mouth at bedtime as needed for sleep., Disp: 30 tablet, Rfl: 0 .  Emollient (CERAVE) CREA, Apply 1 application topically daily as needed (psoriasis). , Disp: , Rfl:  .  ezetimibe (ZETIA) 10 MG tablet, Take 1 tablet (10 mg total) by mouth daily., Disp: 90 tablet, Rfl: 3 .  Flaxseed, Linseed, (FLAX SEED OIL PO), Take by mouth., Disp: , Rfl:  .  fluticasone (FLONASE) 50 MCG/ACT nasal spray, Place 1 spray into both nostrils daily., Disp: , Rfl:  .  gemfibrozil (LOPID) 600 MG tablet, Take 1 tablet (600 mg total) by mouth every morning., Disp: 90 tablet, Rfl: 3 .  glipiZIDE (GLUCOTROL XL) 5 MG 24 hr tablet, TAKE 1 TABLET BY MOUTH EVERY DAY WITH BREAKFAST, Disp: 90 tablet, Rfl: 1 .  ibuprofen (ADVIL,MOTRIN) 200 MG tablet, Take 200 mg by mouth every 6 (six) hours as needed for moderate pain. , Disp: , Rfl:  .  levothyroxine (SYNTHROID) 75 MCG tablet, TAKE 1 TABLET DAILY, Disp: 90 tablet, Rfl: 3 .  losartan (COZAAR) 50 MG tablet, Take 50 mg by mouth daily., Disp: , Rfl:  .  Multiple Vitamin (MULTIVITAMIN) tablet, Take 1 tablet by mouth every other day. , Disp: , Rfl:  .  nitroGLYCERIN (NITROSTAT) 0.4 MG SL tablet, Place 1 tablet (0.4 mg total) under the tongue every 5 (five) minutes as needed., Disp: 30 tablet, Rfl: 6 .  triamcinolone cream (KENALOG) 0.1 %, APPLY TO AFFECTED AREAS ON BODY TWICE DAILY TO AFFECTED AREAS AS NEEDED, Disp: , Rfl:  .  Vitamin D, Cholecalciferol, 1000 units TABS, Take 2,000 Units by mouth every other day. , Disp: , Rfl:     Observations/Objective: Blood pressure 140/68, pulse 90, temperature 97.8 F (36.6 C), height $RemoveBe'5\' 3"'[id number]$  (1.6 m), weight 154 lb (69.9 kg), SpO2 96 %.  Physical Exam Constitutional:      General: She is not in acute distress.    Appearance: Normal appearance. She is well-developed. She is not ill-appearing or toxic-appearing.  HENT:     Head: Normocephalic.     Right Ear: Hearing, tympanic membrane, ear canal and external ear normal.     Left Ear: Hearing, tympanic membrane, ear canal and external ear normal.     Nose: Nose normal.  Eyes:     General: Lids are normal. Lids are everted, no foreign bodies appreciated.     Conjunctiva/sclera: Conjunctivae normal.     Pupils: Pupils are equal, round, and reactive to light.  Neck:     Thyroid: No thyroid mass or thyromegaly.  Vascular: No carotid bruit.     Trachea: Trachea normal.  Cardiovascular:     Rate and Rhythm: Normal rate and regular rhythm.     Heart sounds: Normal heart sounds, S1 normal and S2 normal. No murmur heard.  No gallop.   Pulmonary:     Effort: Pulmonary effort is normal. No respiratory distress.     Breath sounds: Normal breath sounds. No wheezing, rhonchi or rales.  Abdominal:     General: Bowel sounds are normal. There is no distension or abdominal bruit.     Palpations: Abdomen is soft. There is no fluid wave or mass.     Tenderness: There is no abdominal tenderness. There is no guarding or rebound.     Hernia: No hernia is present.  Musculoskeletal:     Cervical back: Normal range of motion and neck supple.  Lymphadenopathy:     Cervical: No cervical adenopathy.  Skin:    General: Skin is warm and dry.     Findings: No rash.  Neurological:     Mental Status: She is alert.     Cranial Nerves: No cranial nerve deficit.     Sensory: No sensory deficit.  Psychiatric:        Mood and Affect: Mood is not anxious or depressed.        Speech: Speech normal.        Behavior: Behavior normal. Behavior is  cooperative.        Judgment: Judgment normal.      Diabetic foot exam: Normal inspection No skin breakdown No calluses  Normal DP pulses Normal sensation to light touch and monofilament Nails normal   Assessment and Plan   The patient's preventative maintenance and recommended screening tests for an annual wellness exam were reviewed in full today. Brought up to date unless services declined.  Counselled on the importance of diet, exercise, and its role in overall health and mortality. The patient's FH and SH was reviewed, including their home life, tobacco status, and drug and alcohol status.   Vaccines:Flu , PNA and COVID uptodate .Shingles - considering Foot exam - DONE today Colon cancer screening - colonoscopy5/2018, plan repeat in 5 years Mammo:11/2018,  Scheduled 12/17/2020 Bone density -11/2017 mild osteopenia in spine, repeat  In 5 year Hep C screening - done nonsmoker  Problem List Items Addressed This Visit    Adult hypothyroidism     Stable on levothyroxine.      Relevant Medications   levothyroxine (SYNTHROID) 75 MCG tablet   Diabetes mellitus without complication ([place])     Will follow more closely with CGM.Marland Kitchen work on low [employer]. May need to increase or change glucotrol.      Relevant Medications   losartan (COZAAR) 50 MG tablet   glipiZIDE (GLUCOTROL XL) 5 MG 24 hr tablet   Hyperlipidemia associated with type 2 diabetes mellitus (HCC)     Intolerant of statin.Marland Kitchen good control  on zetia and gemfibrozil, but hold zetia x 1 week to see is muscle ache resolved/improved. Restart if no improvement.      Relevant Medications   losartan (COZAAR) 50 MG tablet   glipiZIDE (GLUCOTROL XL) 5 MG 24 hr tablet   Hypertension associated with diabetes (HCC)    Tolerable control on losartan 50 mg daily.      Relevant Medications   losartan (COZAAR) 50 MG tablet   glipiZIDE (GLUCOTROL XL) 5 MG 24 hr tablet   MDD (major depressive disorder), recurrent  episode, moderate (HCC)  Moderate control on no med.      Myalgia    ? Med SE ( hold zetia) vs inacitivity ( start home stretching) vs other.       Other Visit Diagnoses    Routine general medical examination at a health care facility    -  Primary         Eliezer Lofts, MD

## 2020-10-14 NOTE — Assessment & Plan Note (Addendum)
Moderate control on no med.

## 2020-10-14 NOTE — Assessment & Plan Note (Addendum)
Will follow more closely with CGM.Marland Kitchen work on low [employer]. May need to increase or change glucotrol.

## 2020-10-14 NOTE — Patient Instructions (Addendum)
Start continuous glucose meter.  Stop zetia for 1 week to see if muscle ache improved. Restart if not improving.  Work low carbohydrate diet.  Increase walking as able.  Get third dose COVID vaccine. Consider shingrix  vaccine.

## 2020-10-14 NOTE — Assessment & Plan Note (Addendum)
Tolerable control on losartan 50 mg daily.  

## 2020-10-14 NOTE — Assessment & Plan Note (Addendum)
Intolerant of statin.Marland Kitchen good control  on zetia and gemfibrozil, but hold zetia x 1 week to see is muscle ache resolved/improved. Restart if no improvement.

## 2020-11-05 DIAGNOSIS — Z23 Encounter for immunization: Secondary | ICD-10-CM | POA: Diagnosis not present

## 2020-11-27 ENCOUNTER — Telehealth: Payer: Medicare Other

## 2020-12-02 ENCOUNTER — Ambulatory Visit: Payer: Medicare Other

## 2020-12-02 DIAGNOSIS — E1159 Type 2 diabetes mellitus with other circulatory complications: Secondary | ICD-10-CM

## 2020-12-02 DIAGNOSIS — E119 Type 2 diabetes mellitus without complications: Secondary | ICD-10-CM

## 2020-12-02 NOTE — Chronic Care Management (AMB) (Signed)
Chronic Care Management Pharmacy  Name: Renee [name]  MRN: [id number] DOB: 27-Apr-1946  Chief Complaint/ HPI  Renee [name],  74 y.o., female presents for their Follow-Up CCM visit with the clinical pharmacist via telephone.  PCP : Jinny Sanders, MD  Their chronic conditions include: HTN, CAD, allergic rhinitis, chronic diarrhea, diabetes, hypothyroidism, OA, osteopenia, hypercholesterolemia, vitamin D, GAD, epigastric pain   Recent med changes: Pt reports she stopped Zetia and gemfibrozil for 3 weeks and feels like a different person. Doing much better. She has been able to increase exercise now that she is feeling better. She is also watching her diet. Per PCP note 10/19, pt was instructed to hold zetia only.   Office Visits:  10/14/20: AWV - DM inadequate control on glucotrol XL 5 mg daily a1c 7.9%, start CGM, stop Zetia 1 week to see if muscle aches improve, restart if not improving, diet, exercise reviewed   08/15/20: Bedsole/depression follow up - started venlafaxine low dose at last OV, thinks it is impairing her balance and causing headache, will wean off, did not tolerate SSRI, will call if interested in another medication   10/05/19: AWV - pt declines DM med changes, BP controlled, cholesterol at goal, thyroid stable, start PPI for dysphagia, Flonase trial for sinus pressure  Consult Visit:  07/09/20: ED - skin reaction to celexa  02/19/20: GI - dysphagia f/u, EGD normal, drink water with each meal, likely associated with dry mouth/Sjogren's Syndrome, d/c Prilosec   01/30/20: OA, psoriasis, taking Tylenol Arthritis and Advil PRN, topical steroids for psoriasis, no med changes rtc 6 months    12/10/19: Cardiology - cont current meds  Allergies  Allergen Reactions  . Other Rash    Metal   . Celexa [Citalopram] Other (See Comments)    Throat swelling and difficulty breathing  . Codeine Nausea And Vomiting  . Metformin Nausea And Vomiting  . Robaxin [Methocarbamol] Nausea  Only  . Simvastatin Swelling and Rash   Medications: Outpatient Encounter Medications as of 12/02/2020  Medication Sig Note  . ACCU-CHEK COMPACT PLUS test strip USE AS DIRECTED TO TEST BLOOD SUGAR DAILY   . acetaminophen (TYLENOL) 650 MG CR tablet One tab as needed every 8 hours, 30 days   . aspirin EC 81 MG tablet Take 81 mg by mouth daily.   . Blood Glucose Monitoring Suppl (ACCU-CHEK COMPACT CARE KIT) KIT Meter Kit   . clobetasol ointment (TEMOVATE) 4.59 % Apply 1 application topically daily as needed (psoriasis flares).   . Continuous Blood Gluc Receiver (FREESTYLE LIBRE 14 DAY READER) DEVI Check blood sugar   . Continuous Blood Gluc Sensor (FREESTYLE LIBRE 14 DAY SENSOR) MISC Check blood sugar E11.9   . diphenhydrAMINE (BENADRYL) 25 MG tablet Take 1 tablet (25 mg total) by mouth at bedtime as needed for sleep.   Marland Kitchen Emollient (CERAVE) CREA Apply 1 application topically daily as needed (psoriasis).  11/17/2016: Mix with triamcinolone  . ezetimibe (ZETIA) 10 MG tablet Take 1 tablet (10 mg total) by mouth daily.   . Flaxseed, Linseed, (FLAX SEED OIL PO) Take by mouth.   . fluticasone (FLONASE) 50 MCG/ACT nasal spray Place 1 spray into both nostrils daily.   Marland Kitchen gemfibrozil (LOPID) 600 MG tablet Take 1 tablet (600 mg total) by mouth every morning.   Marland Kitchen glipiZIDE (GLUCOTROL XL) 5 MG 24 hr tablet TAKE 1 TABLET BY MOUTH EVERY DAY WITH BREAKFAST   . ibuprofen (ADVIL,MOTRIN) 200 MG tablet Take 200 mg by mouth every 6 (  six) hours as needed for moderate pain.    Marland Kitchen levothyroxine (SYNTHROID) 75 MCG tablet TAKE 1 TABLET DAILY   . losartan (COZAAR) 50 MG tablet Take 1 tablet (50 mg total) by mouth daily.   . Multiple Vitamin (MULTIVITAMIN) tablet Take 1 tablet by mouth every other day.    . nitroGLYCERIN (NITROSTAT) 0.4 MG SL tablet Place 1 tablet (0.4 mg total) under the tongue every 5 (five) minutes as needed.   . triamcinolone cream (KENALOG) 0.1 % APPLY TO AFFECTED AREAS ON BODY TWICE DAILY TO AFFECTED  AREAS AS NEEDED   . Vitamin D, Cholecalciferol, 1000 units TABS Take 2,000 Units by mouth every other day.     No facility-administered encounter medications on file as of 12/02/2020.   Current Diagnosis/Assessment:   Emergency planning/management officer Strain: Low Risk   . Difficulty of Paying Living Expenses: Not hard at all   Goals Addressed   None    Hypertension   CMP Latest Ref Rng & Units 10/07/2020 09/28/2019 07/10/2019  Glucose 70 - 99 mg/dL 143(H) 160(H) 139(H)  BUN 6 - 23 mg/dL 28(H) 23 21  Creatinine 0.40 - 1.20 mg/dL 0.70 0.74 0.69  Sodium 135 - 145 mEq/L 138 143 142  Potassium 3.5 - 5.1 mEq/L 4.5 4.1 3.9  Chloride 96 - 112 mEq/L 103 107 107  CO2 19 - 32 mEq/L _0 Calcium 8.4 - 10.5 mg/dL 9.8 10.0 9.7  Total Protein 6.0 - 8.3 g/dL 7.5 7.4 7.6  Total Bilirubin 0.2 - 1.2 mg/dL 0.4 0.5 0.4  Alkaline Phos 39 - 117 U/L 54 63 59  AST 0 - 37 U/L _1 ALT 0 - 35 U/L _2 Office blood pressures are: BP Readings from Last 3 Encounters:  10/14/20 140/68  08/15/20 140/66  07/08/20 140/70   Patient has failed these meds in the past: none reported Patient checks BP at home: daughter comes to check BP occasionally - none reported  BP < 140/90 mmHg Patient is currently controlled on the following medications:   Losartan 50 mg - 1 tablet daily   Update 12/02/20: losartan refills timely, clinic readings much improved   Plan: Continue current medications  Hyperlipidemia/CAD   Lipid Panel     Component Value Date/Time   CHOL 131 10/07/2020 1220   CHOL 168 10/20/2015 1241   TRIG 65.0 10/07/2020 1220   HDL 42.10 10/07/2020 1220   HDL 47 10/20/2015 1241   LDLCALC 76 10/07/2020 1220   LDLCALC 102 (H) 10/20/2015 1241    CBC Latest Ref Rng & Units 03/28/2018 01/31/2018 11/17/2016  WBC 4.0 - 10.5 K/uL 10.0 5.8 16.9(H)  Hemoglobin 12.0 - 15.0 g/dL 13.2 13.2 13.4  Hematocrit 36 - 46 % 40.6 39.7 39.4  Platelets 150 - 400 K/uL 380 353.0 423(H)   LDL goal < 70 Patient has  failed these meds in past: simvastatin - swelling/rash, gemfibrozil - muscle pain, ezetimibe - muscle pain  Patient is currently controlled on the following medications: . Aspirin 81 mg - 1 tablet daily  . Nitroglycerin 0.4 mg SL - PRN  Update 12/02/20: Pt currently off gemfibrozil and ezetimibe since October 2021, still taking aspirin daily. She is feeling a lot better off the cholesterol lowering therapies. She is open to restarting one medication if LDL above goal with next check. Discussed preference for Zetia over gemfibrozil due to CV protection. She prefers gemfibrozil as she was on it for a long time. She reports increasing  her exercise now that she is not having as much muscle pain. Lipid panel 10/07/20 pt was still on zetia, gemfibrozil.  Plan: Continue current medications; Limit cholesterol content in foods and increase exercise as tolerated.   Diabetes   Lab Results  Component Value Date   CREATININE 0.70 10/07/2020   BUN 28 (H) 10/07/2020   GFR 84.10 10/07/2020   GFRNONAA >60 03/28/2018   GFRAA >60 03/28/2018   NA 138 10/07/2020   K 4.5 10/07/2020   CALCIUM 9.8 10/07/2020   CO2 27 10/07/2020   Recent Relevant Labs: Lab Results  Component Value Date/Time   HGBA1C 7.9 (H) 10/07/2020 12:20 PM   HGBA1C 7.2 (H) 01/07/2020 12:16 PM    Checking BG: Rarely -  difficulty sticking fingers/pain and cracking  Reports feeling weak if BG < 125  Patient started on Farxiga 2019 but was not covered by insurance, switch to glipizide. A1c goal < 7.5% (patient preference) Patient has failed these meds in past: metformin - nausea/diarrhea, Januvia - cost Patient is currently uncontrolled on the following medications:   Glipizide 5 mg XL - 1 tablet daily  Last diabetic eye exam:  Lab Results  Component Value Date/Time   HMDIABEYEEXA No Retinopathy 03/20/2020 12:00 AM    Last diabetic foot exam:  Lab Results  Component Value Date/Time   HMDIABFOOTEX done 09/29/2020 12:00 AM     Diet: Reports balanced diet, limits cookies/chips, carbohydrates. Drinks flavored water.  Update 12/02/20: Pt reports she was unable to afford [employer]. Still checking BG infrequently, last checked about 2 weeks ago. Usually checks her BG before breakfast - reports it is always < 200. Confirms still taking glipizide daily. Considering A1c increase, pt is open to adding new DM medication and apply for patient assistance. Would like to avoid SGLT-2 due to risk of yeast infections. Pt likes the idea of a once weekly injection.   Plan: Continue current medications; Recommend applying for Trulicity once weekly through patient assistance to replace glipizide.   Depression   Patient has failed these meds in past: citalopram - throat swelling, venlafaxine - imbalance, trazodone Patient is currently uncontrolled on the following medications:   No pharmacotherapy  Update 12/02/20: Patient would like to complete CBT  Plan: Consult PCP for referral to CBT  Hypothyroidism   Lab Results  Component Value Date/Time   TSH 2.85 10/07/2020 12:20 PM   TSH 6.31 (H) 07/08/2020 02:46 PM   TSH 4.66 (H) 09/28/2019 08:54 AM   Patient has failed these meds in past: none  Patient is currently controlled on the following medications:  . Levothyroxine 75 mcg - 1 tablet daily before breakfast   Update 12/02/20: Pt confirms adherence, refills timely  Plan: Continue current medications   Vitamin D Deficiency    Vitamin D: 09/28/19 41  Patient has failed these meds in past: none  Patient is currently controlled on the following medications:   Vitamin D 2000 IU - 1 tablet daily  Update 12/02/20: Pt confirms adherence  Plan: Continue current medications   Medication Management  Misc: Clobetasol ointment 0.05% - applies PRN, triamcinolone cream 0.1% - applies PRN (  OTCs: multivitamin, flonase nasal spray PRN, Allegra 180 mg daily, flax seed oil, magnesium for leg cramps  Pharmacy/Benefits:  Medicare Part D ([employer])/[facility] (most meds) & [facility] for certain medications with coupons (Zetia, creams)  Adherence: no concerns  Affordability: cost concerns but currently able to afford medications  CCM Follow Up: 2 months (telephone)  Debbora Dus, PharmD  Clinical Pharmacist [facility] [phone]

## 2020-12-03 NOTE — Progress Notes (Signed)
Cardiology Office Note  Date:  12/08/2020   ID:  Renee [name], DOB 10/16/1946, MRN [id number]  PCP:  Bedsole, Amy E, MD   Chief Complaint  Patient presents with  . Follow-up    12 month F/U; Meds verbally reviewed with patient.    HPI:  Renee [name] is a very pleasant 74-year-old woman with a history of  hypertension,  hyperlipidemia,  long history of diabetes.  Cardiac catheterization October 2011 for chest pain showed mild to moderate non-obstructive CAD. 2010  With  EF of 55% to 60%.   40-50% LAD disease, 60% ostial diagonal disease, 40% PDA History of 2 knee replacements  in [place] Medication intolerances She presents today for follow-up of her coronary artery disease  Doing well, On her prior clinic visit she reported having Trouble swallowing Had egd, looked ok Does not take a PPI on a regular basis  active at baseline, no regular exercise program  High blood pressure today Reports that she was stressed getting into the office, her main entrance door to the building was locked and had to go around to main hospital entrance  Recently developed tender myalgias etiology unclear Stopped gemfibrozil and zetia Now better, uncertain if it was from the medications or other etiology  Labs from 09/2019 reviewed HAB1C 7.9 x2  Total chol 131, LDL 76 on zetia Previously unable to tolerate statins  EKG personally reviewed by myself on todays visit Shows normal sinus rhythm with rate 81 bpm no significant ST or T wave changes No change  Previous significant reaction to statins, simvastatin may have caused a rash.     PMH:   has a past medical history of Allergy, Anxiety, Arthritis, CAD (coronary artery disease), Diabetes mellitus, History of chicken pox, History of phobia, Hyperlipidemia, Hypertension, Hypothyroidism, Kidney stones, Lichen sclerosus, Psoriasis, and Urinary tract infection.  PSH:    Past Surgical History:  Procedure Laterality Date  . APPENDECTOMY    .  CARDIAC CATHETERIZATION     2011  . CARPAL TUNNEL RELEASE     Left trigger finger  . CATARACT EXTRACTION W/PHACO Left 06/06/2018   Procedure: CATARACT EXTRACTION PHACO AND INTRAOCULAR LENS PLACEMENT (IOC);  Surgeon: Porfilio, William, MD;  Location: [facility] ORS;  Service: Ophthalmology;  Laterality: Left;  US  00:23 AP% 16.0 CDE 3.69 Fluid pack lot # [id number]  . CATARACT EXTRACTION W/PHACO Right 09/19/2018   Procedure: CATARACT EXTRACTION PHACO AND INTRAOCULAR LENS PLACEMENT (IOC);  Surgeon: Porfilio, William, MD;  Location: [facility] ORS;  Service: Ophthalmology;  Laterality: Right;  US 00:29.5 AP% 17.0 CDE$ 5.03 Fluid pack lot # [id number]  . COLONOSCOPY WITH PROPOFOL N/A 05/13/2017   Procedure: COLONOSCOPY WITH PROPOFOL;  Surgeon: Elliott, Robert T, MD;  Location: [facility] ENDOSCOPY;  Service: Endoscopy;  Laterality: N/A;  . ESOPHAGOGASTRODUODENOSCOPY (EGD) WITH PROPOFOL N/A 01/04/2020   Procedure: ESOPHAGOGASTRODUODENOSCOPY (EGD) WITH PROPOFOL;  Surgeon: Anna, Kiran, MD;  Location: [facility] ENDOSCOPY;  Service: Gastroenterology;  Laterality: N/A;  . EYE SURGERY  2004   lasik - right eye   . JOINT REPLACEMENT    . KNEE SURGERY Right 2015   arthroscopy  . TOTAL KNEE ARTHROPLASTY Bilateral 06/12/2015   Procedure: TOTAL KNEE BILATERAL;  Surgeon: Frank Aluisio, MD;  Location: [facility];  Service: Orthopedics;  Laterality: Bilateral;  and epidural  . TUBAL LIGATION    . Tubes tied     and untied. and tied again    Current Outpatient Medications  Medication Sig Dispense Refill  . ACCU-CHEK COMPACT PLUS test   strip USE AS DIRECTED TO TEST BLOOD SUGAR DAILY 100 each 1  . acetaminophen (TYLENOL) 650 MG CR tablet One tab as needed every 8 hours, 30 days    . aspirin EC 81 MG tablet Take 81 mg by mouth daily.    . Blood Glucose Monitoring Suppl (ACCU-CHEK COMPACT CARE KIT) KIT Meter Kit 1 each 0  . clobetasol ointment (TEMOVATE) 0.05 % Apply 1 application topically daily as needed (psoriasis flares). 30 g 1  .  Continuous Blood Gluc Receiver (FREESTYLE LIBRE 14 DAY READER) DEVI Check blood sugar 1 each 0  . Continuous Blood Gluc Sensor (FREESTYLE LIBRE 14 DAY SENSOR) MISC Check blood sugar E11.9 6 each 3  . Emollient (CERAVE) CREA Apply 1 application topically daily as needed (psoriasis).    . Flaxseed, Linseed, (FLAX SEED OIL PO) Take by mouth.    . fluticasone (FLONASE) 50 MCG/ACT nasal spray Place 1 spray into both nostrils daily.    . glipiZIDE (GLUCOTROL XL) 5 MG 24 hr tablet TAKE 1 TABLET BY MOUTH EVERY DAY WITH BREAKFAST 90 tablet 1  . ibuprofen (ADVIL,MOTRIN) 200 MG tablet Take 200 mg by mouth every 6 (six) hours as needed for moderate pain.    . levothyroxine (SYNTHROID) 75 MCG tablet TAKE 1 TABLET DAILY 90 tablet 3  . losartan (COZAAR) 50 MG tablet Take 1 tablet (50 mg total) by mouth daily. 90 tablet 3  . Multiple Vitamin (MULTIVITAMIN) tablet Take 1 tablet by mouth every other day.     . nitroGLYCERIN (NITROSTAT) 0.4 MG SL tablet Place 1 tablet (0.4 mg total) under the tongue every 5 (five) minutes as needed. 30 tablet 6  . triamcinolone cream (KENALOG) 0.1 % APPLY TO AFFECTED AREAS ON BODY TWICE DAILY TO AFFECTED AREAS AS NEEDED    . Vitamin D, Cholecalciferol, 1000 units TABS Take 2,000 Units by mouth daily.     No current facility-administered medications for this visit.    Allergies:   Other, Celexa [citalopram], Codeine, Metformin, Robaxin [methocarbamol], and Simvastatin   Social History:  The patient  reports that she has never smoked. She has never used smokeless tobacco. She reports current alcohol use. She reports that she does not use drugs.   Family History:   family history includes Arthritis in her brother and brother; Breast cancer (age of onset: 49) in her daughter; COPD in her mother; Diabetes in her brother, brother, mother, and sister; Heart disease in her brother and brother; Hypertension in her father, mother, and sister; Stomach cancer in her maternal grandfather;  Stroke in her maternal grandfather.   Review of Systems: Review of Systems  Constitutional: Negative.   Respiratory: Negative.   Cardiovascular: Negative.   Gastrointestinal: Negative.   Musculoskeletal: Negative.   Neurological: Negative.   Psychiatric/Behavioral: Negative.   All other systems reviewed and are negative.  PHYSICAL EXAM: VS:  BP (!) 150/74 (BP Location: Left Arm, Patient Position: Sitting, Cuff Size: Large)   Pulse 81   Ht 5' 3" (1.6 m)   Wt 154 lb (69.9 kg)   SpO2 96%   BMI 27.28 kg/m  , BMI Body mass index is 27.28 kg/m.   Recent Labs: 10/07/2020: ALT 25; BUN 28; Creatinine, Ser 0.70; Potassium 4.5; Sodium 138; TSH 2.85   Lipid Panel Lab Results  Component Value Date   CHOL 131 10/07/2020   HDL 42.10 10/07/2020   LDLCALC 76 10/07/2020   TRIG 65.0 10/07/2020      Wt Readings from Last 3 Encounters:    12/08/20 154 lb (69.9 kg)  10/14/20 154 lb (69.9 kg)  08/15/20 151 lb (68.5 kg)     ASSESSMENT AND PLAN:  Essential hypertension -  BP elevated today, trouble getting into the office,  Suggested she monitor at home Call with number  Atherosclerosis of native coronary artery of native heart without angina pectoris - Plan: EKG 12-Lead Currently with no symptoms of angina. No further workup at this time. Continue current medication regimen.  Hypercholesteremia Statin intolerance, Stopped gemfibrozil/Zetia, possible muscle side effect stable   Type 2 diabetes mellitus with complication, unspecified long term insulin use status (HCC) We have encouraged continued exercise, careful diet management    Total encounter time more than 25 minutes  Greater than 50% was spent in counseling and coordination of care with the patient   No orders of the defined types were placed in this encounter.    Signed, Tim , M.D., Ph.D. 12/08/2020  [facility], [place] [phone]   

## 2020-12-07 ENCOUNTER — Other Ambulatory Visit: Payer: Self-pay

## 2020-12-08 ENCOUNTER — Other Ambulatory Visit: Payer: Self-pay

## 2020-12-08 ENCOUNTER — Encounter: Payer: Self-pay | Admitting: Cardiovascular Disease

## 2020-12-08 ENCOUNTER — Ambulatory Visit (INDEPENDENT_AMBULATORY_CARE_PROVIDER_SITE_OTHER): Payer: Medicare Other | Admitting: Cardiovascular Disease

## 2020-12-08 VITALS — BP 150/74 | HR 81 | Ht 63.0 in | Wt 154.0 lb

## 2020-12-08 DIAGNOSIS — I25118 Atherosclerotic heart disease of native coronary artery with other forms of angina pectoris: Secondary | ICD-10-CM

## 2020-12-08 DIAGNOSIS — E119 Type 2 diabetes mellitus without complications: Secondary | ICD-10-CM | POA: Diagnosis not present

## 2020-12-08 DIAGNOSIS — E7800 Pure hypercholesterolemia, unspecified: Secondary | ICD-10-CM

## 2020-12-08 DIAGNOSIS — I10 Essential (primary) hypertension: Secondary | ICD-10-CM

## 2020-12-08 NOTE — Patient Instructions (Addendum)
Medication Instructions:  No changes  If you need a refill on your cardiac medications before your next appointment, please call your pharmacy.    Lab work: No new labs needed   If you have labs (blood work) drawn today and your tests are completely normal, you will receive your results only by: Marland Kitchen MyChart Message (if you have MyChart) OR . A paper copy in the mail If you have any lab test that is abnormal or we need to change your treatment, we will call you to review the results.   Testing/Procedures: No new testing needed   Follow-Up: At [facility], you and your health needs are our priority.  As part of our continuing mission to provide you with exceptional heart care, we have created designated Provider Care Teams.  These Care Teams include your primary Cardiologist (physician) and Advanced Practice Providers (APPs -  Physician Assistants and Nurse Practitioners) who all work together to provide you with the care you need, when you need it.  . You will need a follow up appointment in 12 months  . Providers on your designated Care Team:   . Murray Hodgkins, NP . Christell Faith, PA-C . Marrianne Mood, PA-C  Any Other Special Instructions Will Be Listed Below (If Applicable).  Monitor blood pressure It is running high today, could be stress, rushing into office Call with numbers  COVID-19 Vaccine Information can be found at: ShippingScam.co.uk For questions related to vaccine distribution or appointments, please email [email or web address] or call [phone].     Blood Pressure Record Sheet To take your blood pressure, you will need a blood pressure machine. You can buy a blood pressure machine (blood pressure monitor) at your clinic, drug store, or online. When choosing one, consider:  An automatic monitor that has an arm cuff.  A cuff that wraps snugly around your upper arm. You should be able to fit only  one finger between your arm and the cuff.  A device that stores blood pressure reading results.  Do not choose a monitor that measures your blood pressure from your wrist or finger. Follow your health care provider's instructions for how to take your blood pressure. To use this form:  Get one reading in the morning (a.m.) before you take any medicines.  Get one reading in the evening (p.m.) before supper.  Take at least 2 readings with each blood pressure check. This makes sure the results are correct. Wait 1-2 minutes between measurements.  Write down the results in the spaces on this form.  Repeat this once a week, or as told by your health care provider.  Make a follow-up appointment with your health care provider to discuss the results.       Blood pressure log Date: _______________________  a.m. _____________________(1st reading) _____________________(2nd reading)  p.m. _____________________(1st reading) _____________________(2nd reading) Date: _______________________  a.m. _____________________(1st reading) _____________________(2nd reading)  p.m. _____________________(1st reading) _____________________(2nd reading) Date: _______________________  a.m. _____________________(1st reading) _____________________(2nd reading)  p.m. _____________________(1st reading) _____________________(2nd reading) Date: _______________________  a.m. _____________________(1st reading) _____________________(2nd reading)  p.m. _____________________(1st reading) _____________________(2nd reading) Date: _______________________  a.m. _____________________(1st reading) _____________________(2nd reading)  p.m. _____________________(1st reading) _____________________(2nd reading) Date: _______________________  a.m. _____________________(1st reading) _____________________(2nd reading)  p.m. _____________________(1st reading) _____________________(2nd reading) Date:  _______________________  a.m. _____________________(1st reading) _____________________(2nd reading)  p.m. _____________________(1st reading) _____________________(2nd reading) Date: _______________________  a.m. _____________________(1st reading) _____________________(2nd reading)  p.m. _____________________(1st reading) _____________________(2nd reading) Date: _______________________  a.m. _____________________(1st reading) _____________________(2nd reading)  p.m. _____________________(1st reading) _____________________(2nd reading) Date: _______________________  a.m. _____________________(1st reading) _____________________(2nd reading)  p.m. _____________________(1st reading) _____________________(2nd reading) Date: _______________________  a.m. _____________________(1st reading) _____________________(2nd reading)  p.m. _____________________(1st reading) _____________________(2nd reading) Date: _______________________  a.m. _____________________(1st reading) _____________________(2nd reading)  p.m. _____________________(1st reading) _____________________(2nd reading) Date: _______________________  a.m. _____________________(1st reading) _____________________(2nd reading)  p.m. _____________________(1st reading) _____________________(2nd reading) Date: _______________________  a.m. _____________________(1st reading) _____________________(2nd reading)  p.m. _____________________(1st reading) _____________________(2nd reading) Date: _______________________  a.m. _____________________(1st reading) _____________________(2nd reading)  p.m. _____________________(1st reading) _____________________(2nd reading)   How to Take Your Blood Pressure You can take your blood pressure at home with a machine. You may need to check your blood pressure at home:  To check if you have high blood pressure (hypertension).  To check your blood pressure over time.  To make sure your  blood pressure medicine is working. Supplies needed: You will need a blood pressure machine, or monitor. You can buy one at a drugstore or online. When choosing one:  Choose one with an arm cuff.  Choose one that wraps around your upper arm. Only one finger should fit between your arm and the cuff.  Do not choose one that measures your blood pressure from your wrist or finger. Your doctor can suggest a monitor. How to prepare Avoid these things for 30 minutes before checking your blood pressure:  Drinking caffeine.  Drinking alcohol.  Eating.  Smoking.  Exercising. Five minutes before checking your blood pressure:  Pee.  Sit in a dining chair. Avoid sitting in a soft couch or armchair.  Be quiet. Do not talk. How to take your blood pressure Follow the instructions that came with your machine. If you have a digital blood pressure monitor, these may be the instructions: 1. Sit up straight. 2. Place your feet on the floor. Do not cross your ankles or legs. 3. Rest your left arm at the level of your heart. You may rest it on a table, desk, or chair. 4. Pull up your shirt sleeve. 5. Wrap the blood pressure cuff around the upper part of your left arm. The cuff should be 1 inch (2.5 cm) above your elbow. It is best to wrap the cuff around bare skin. 6. Fit the cuff snugly around your arm. You should be able to place only one finger between the cuff and your arm. 7. Put the cord inside the groove of your elbow. 8. Press the power button. 9. Sit quietly while the cuff fills with air and loses air. 10. Write down the numbers on the screen. 11. Wait 2-3 minutes and then repeat steps 1-10. What do the numbers mean? Two numbers make up your blood pressure. The first number is called systolic pressure. The second is called diastolic pressure. An example of a blood pressure reading is "120 over 80" (or 120/80). If you are an adult and do not have a medical condition, use this guide to  find out if your blood pressure is normal: Normal  First number: below 120.  Second number: below 80. Elevated  First number: 120-129.  Second number: below 80. Hypertension stage 1  First number: 130-139.  Second number: 80-89. Hypertension stage 2  First number: 140 or above.  Second number: 37 or above. Your blood pressure is above normal even if only the top or bottom number is above normal. Follow these instructions at home:  Check your blood pressure as often as your doctor tells you to.  Take your monitor to your next doctor's appointment. Your doctor will: ? Make sure you  are using it correctly. ? Make sure it is working right.  Make sure you understand what your blood pressure numbers should be.  Tell your doctor if your medicines are causing side effects. Contact a doctor if:  Your blood pressure keeps being high. Get help right away if:  Your first blood pressure number is higher than 180.  Your second blood pressure number is higher than 120. This information is not intended to replace advice given to you by your health care provider. Make sure you discuss any questions you have with your health care provider. Document Revised: 11/25/2017 Document Reviewed: 05/21/2016 Elsevier Patient Education  2020 [employer].

## 2020-12-09 ENCOUNTER — Telehealth: Payer: Self-pay

## 2020-12-09 MED ORDER — TRULICITY 0.75 MG/0.5ML ~~LOC~~ SOAJ: 0.7500 mg | SUBCUTANEOUS | 11 refills | Status: DC

## 2020-12-09 NOTE — Telephone Encounter (Signed)
Rx for trulicity sent to local pharmacy.  yes she needs a referral for counseling... I place the referral and then she calls. Butch Penny please call pt for location preference and given her [place] Number.  CC [name] as Juluis Rainier

## 2020-12-09 NOTE — Patient Instructions (Addendum)
Dear Renee [name],  Below is a summary of the goals we discussed during our follow up appointment on December 09, 2020. Please contact me anytime with questions or concerns.   Visit Information  Goals Addressed            This Visit's Progress   . Pharmacy Care Plan       CARE PLAN ENTRY  Current Barriers:  . Chronic Disease Management support, education, and care coordination needs related to Hypertension, Hyperlipidemia, and Diabetes   Hypertension . Pharmacist Clinical Goal(s): o Over the next 2 months, patient will work with PharmD and providers to maintain BP goal <140/90 mmHg . Current regimen:   Losartan 50 mg - 1 tablet daily  . Interventions: o Reviewed clinic and home blood pressure monitoring - BP within goal . Patient self care activities - Over the next 2 months, patient will: o Check blood pressure weekly, document, and provide at future appointment - call if blood pressure is above 140/90 mmHg o  Ensure daily salt intake < 2300 mg/day  Hyperlipidemia . Pharmacist Clinical Goal(s): o Over the next 2 months, patient will work with PharmD and providers to achieve LDL goal < 70 . Current regimen:  . No pharmacotherapy  . Failed therapies: statins, zetia, gemfibrozil . Interventions: o Continue off medications  o Discussed dietary modifications and exercise  . Patient self care activities - Over the next 2 months, patient will: o Slowly increase exercise with goal of 30 minutes, 5 days per week o Incorporate a healthy diet high in vegetables, fruits and whole grains with low-fat dairy products, chicken, fish, legumes, non-tropical vegetable oils and nuts. Limit intake of sweets, sugar-sweetened beverages and red meats.  Diabetes . Pharmacist Clinical Goal(s): o Over the next 2 months, patient will work with PharmD and providers to achieve A1c goal < 7.5% . Current regimen:   Glipizide 5 mg XL - 1 tablet daily . Interventions: o Consult PCP to start  Trulicity . Patient self care activities - Over the next 2 months, patient will: o Check blood sugar with symptoms of low blood sugar, document, and provide at future appointments o Contact provider with any episodes of hypoglycemia (blood sugar < 70 mg/dL) o Continue current medication until further contact from [place]   Please see past updates related to this goal by clicking on the "Past Updates" button in the selected goal        The patient verbalized understanding of instructions, educational materials, and care plan provided today and agreed to receive a mailed copy of patient instructions, educational materials, and care plan.   The pharmacy team will reach out to the patient again over the next 30 days.   Debbora Dus, PharmD Clinical Pharmacist [facility] at [facility] [phone]   Diabetes Mellitus and Nutrition, Adult When you have diabetes (diabetes mellitus), it is very important to have healthy eating habits because your blood sugar (glucose) levels are greatly affected by what you eat and drink. Eating healthy foods in the appropriate amounts, at about the same times every day, can help you:  Control your blood glucose.  Lower your risk of heart disease.  Improve your blood pressure.  Reach or maintain a healthy weight. Every person with diabetes is different, and each person has different needs for a meal plan. Your health care provider may recommend that you work with a diet and nutrition specialist (dietitian) to make a meal plan that is best for you. Your  meal plan may vary depending on factors such as:  The calories you need.  The medicines you take.  Your weight.  Your blood glucose, blood pressure, and cholesterol levels.  Your activity level.  Other health conditions you have, such as heart or kidney disease. How do carbohydrates affect me? Carbohydrates, also called carbs, affect your blood glucose level more than any other type  of food. Eating carbs naturally raises the amount of glucose in your blood. Carb counting is a method for keeping track of how many carbs you eat. Counting carbs is important to keep your blood glucose at a healthy level, especially if you use insulin or take certain oral diabetes medicines. It is important to know how many carbs you can safely have in each meal. This is different for every person. Your dietitian can help you calculate how many carbs you should have at each meal and for each snack. Foods that contain carbs include:  Bread, cereal, rice, pasta, and crackers.  Potatoes and corn.  Peas, beans, and lentils.  Milk and yogurt.  Fruit and juice.  Desserts, such as cakes, cookies, ice cream, and candy. How does alcohol affect me? Alcohol can cause a sudden decrease in blood glucose (hypoglycemia), especially if you use insulin or take certain oral diabetes medicines. Hypoglycemia can be a life-threatening condition. Symptoms of hypoglycemia (sleepiness, dizziness, and confusion) are similar to symptoms of having too much alcohol. If your health care provider says that alcohol is safe for you, follow these guidelines:  Limit alcohol intake to no more than 1 drink per day for nonpregnant women and 2 drinks per day for men. One drink equals 12 oz of beer, 5 oz of wine, or 1 oz of hard liquor.  Do not drink on an empty stomach.  Keep yourself hydrated with water, diet soda, or unsweetened iced tea.  Keep in mind that regular soda, juice, and other mixers may contain a lot of sugar and must be counted as carbs. What are tips for following this plan?  Reading food labels  Start by checking the serving size on the "Nutrition Facts" label of packaged foods and drinks. The amount of calories, carbs, fats, and other nutrients listed on the label is based on one serving of the item. Many items contain more than one serving per package.  Check the total grams (g) of carbs in one  serving. You can calculate the number of servings of carbs in one serving by dividing the total carbs by 15. For example, if a food has 30 g of total carbs, it would be equal to 2 servings of carbs.  Check the number of grams (g) of saturated and trans fats in one serving. Choose foods that have low or no amount of these fats.  Check the number of milligrams (mg) of salt (sodium) in one serving. Most people should limit total sodium intake to less than 2,300 mg per day.  Always check the nutrition information of foods labeled as "low-fat" or "nonfat". These foods may be higher in added sugar or refined carbs and should be avoided.  Talk to your dietitian to identify your daily goals for nutrients listed on the label. Shopping  Avoid buying canned, premade, or processed foods. These foods tend to be high in fat, sodium, and added sugar.  Shop around the outside edge of the grocery store. This includes fresh fruits and vegetables, bulk grains, fresh meats, and fresh dairy. Cooking  Use low-heat cooking methods, such as  baking, instead of high-heat cooking methods like deep frying.  Cook using healthy oils, such as olive, canola, or sunflower oil.  Avoid cooking with butter, cream, or high-fat meats. Meal planning  Eat meals and snacks regularly, preferably at the same times every day. Avoid going long periods of time without eating.  Eat foods high in fiber, such as fresh fruits, vegetables, beans, and whole grains. Talk to your dietitian about how many servings of carbs you can eat at each meal.  Eat 4-6 ounces (oz) of lean protein each day, such as lean meat, chicken, fish, eggs, or tofu. One oz of lean protein is equal to: ? 1 oz of meat, chicken, or fish. ? 1 egg. ?  cup of tofu.  Eat some foods each day that contain healthy fats, such as avocado, nuts, seeds, and fish. Lifestyle  Check your blood glucose regularly.  Exercise regularly as told by your health care provider.  This may include: ? 150 minutes of moderate-intensity or vigorous-intensity exercise each week. This could be brisk walking, biking, or water aerobics. ? Stretching and doing strength exercises, such as yoga or weightlifting, at least 2 times a week.  Take medicines as told by your health care provider.  Do not use any products that contain nicotine or tobacco, such as cigarettes and e-cigarettes. If you need help quitting, ask your health care provider.  Work with a Social worker or diabetes educator to identify strategies to manage stress and any emotional and social challenges. Questions to ask a health care provider  Do I need to meet with a diabetes educator?  Do I need to meet with a dietitian?  What number can I call if I have questions?  When are the best times to check my blood glucose? Where to find more information:  American Diabetes Association: diabetes.org  Academy of Nutrition and Dietetics: www.eatright.[employer] of Diabetes and Digestive and Kidney Diseases (NIH): [employer] Summary  A healthy meal plan will help you control your blood glucose and maintain a healthy lifestyle.  Working with a diet and nutrition specialist (dietitian) can help you make a meal plan that is best for you.  Keep in mind that carbohydrates (carbs) and alcohol have immediate effects on your blood glucose levels. It is important to count carbs and to use alcohol carefully. This information is not intended to replace advice given to you by your health care provider. Make sure you discuss any questions you have with your health care provider. Document Revised: 11/25/2017 Document Reviewed: 01/17/2017 Elsevier Patient Education  2020 [employer].

## 2020-12-09 NOTE — Telephone Encounter (Signed)
PCP consult regarding CCM visit 12/02/20:  Diabetes: Patient is interested in trying Trulicity due to recent elevated A1c. No history of pancreatitis or thyroid cancer. She was unable to afford [employer]. Not checking BG often. If you agree, please send prescription for Trulicity 2.76 mg weekly to her pharmacy so it will be on file. We will apply for patient assistance to help with cost. Patient plans to come into the office this week to start paperwork. Will have paperwork ready for her at front desk.  Recent Relevant Labs: Lab Results  Component Value Date/Time   HGBA1C 7.9 (H) 10/07/2020 12:20 PM   HGBA1C 7.2 (H) 01/07/2020 12:16 PM    Depression/anxiety: Patient expressed interest in counseling/CBT. She is unsure if she needs a referral for these services.  Thanks!  Debbora Dus, PharmD Clinical Pharmacist [facility] at [facility] [phone]

## 2020-12-10 NOTE — Telephone Encounter (Signed)
Patient came into office to sign paperwork. Placed in box for Renee [name]. Patient also is confused about conversation for medication and would like to discuss with [name] further. Please advise.

## 2020-12-10 NOTE — Telephone Encounter (Signed)
Patient notified as instructed by telephone and verbalized understanding. Patient stated that she would prefer to see someone in [place] for counseling..  Patient stated that she has an upcoming appointment with [name] the pharmacist and will get the telephone number for [place]  when she comes in. Patient would not write the number down while on the phone call. [phone]

## 2020-12-11 ENCOUNTER — Telehealth: Payer: Self-pay

## 2020-12-11 NOTE — Telephone Encounter (Signed)
Placed in Dr. Rometta Emery box on desk for signature.

## 2020-12-11 NOTE — Telephone Encounter (Addendum)
Called and clarified questions - see telephone note 12/16. Butch Penny, can you have Dr. Diona Browner sign the Trulicity forms in my box and fax to manufacturer?   Debbora Dus, PharmD Clinical Pharmacist [facility] at [facility] [phone]

## 2020-12-11 NOTE — Chronic Care Management (AMB) (Signed)
Chronic Care Management Pharmacy Assistant   Name: ADRIE PICKING  MRN: [id number] DOB: [birth year]  Debbora Dus discussed with patient starting Trulicity earlier this week. We applied for patient assistance on behalf of the patient. She came into the office 12/15 and signed paperwork but told the office staff she was confused about the discussion with [name]. Patient was contacted to clarify any questions she had. She states whomever called yesterday was telling her about pills and she was under the impression she was going to get "a shot". She wanted to know how often she will have to inject the medication and it was explained it will be weekly.  It was confirmed she was getting an injection but it will be a few weeks before she gets this since it is coming from the manufacturer. She was also made aware to continue glipizide until she receives the trulicity.   PCP : Jinny Sanders, MD  Allergies:   Allergies  Allergen Reactions  . Other Rash    Metal   . Celexa [Citalopram] Other (See Comments)    Throat swelling and difficulty breathing  . Codeine Nausea And Vomiting  . Metformin Nausea And Vomiting  . Robaxin [Methocarbamol] Nausea Only  . Simvastatin Swelling and Rash    Medications: Outpatient Encounter Medications as of 12/11/2020  Medication Sig Note  . ACCU-CHEK COMPACT PLUS test strip USE AS DIRECTED TO TEST BLOOD SUGAR DAILY   . acetaminophen (TYLENOL) 650 MG CR tablet One tab as needed every 8 hours, 30 days   . aspirin EC 81 MG tablet Take 81 mg by mouth daily.   . Blood Glucose Monitoring Suppl (ACCU-CHEK COMPACT CARE KIT) KIT Meter Kit   . clobetasol ointment (TEMOVATE) 2.95 % Apply 1 application topically daily as needed (psoriasis flares).   . Continuous Blood Gluc Receiver (FREESTYLE LIBRE 14 DAY READER) DEVI Check blood sugar   . Continuous Blood Gluc Sensor (FREESTYLE LIBRE 14 DAY SENSOR) MISC Check blood sugar E11.9   . Dulaglutide (TRULICITY) 2.84 XL/2.4MW  SOPN Inject 0.75 mg into the skin once a week.   . Emollient (CERAVE) CREA Apply 1 application topically daily as needed (psoriasis). 11/17/2016: Mix with triamcinolone  . Flaxseed, Linseed, (FLAX SEED OIL PO) Take by mouth.   . fluticasone (FLONASE) 50 MCG/ACT nasal spray Place 1 spray into both nostrils daily.   Marland Kitchen glipiZIDE (GLUCOTROL XL) 5 MG 24 hr tablet TAKE 1 TABLET BY MOUTH EVERY DAY WITH BREAKFAST   . ibuprofen (ADVIL,MOTRIN) 200 MG tablet Take 200 mg by mouth every 6 (six) hours as needed for moderate pain.   Marland Kitchen levothyroxine (SYNTHROID) 75 MCG tablet TAKE 1 TABLET DAILY   . losartan (COZAAR) 50 MG tablet Take 1 tablet (50 mg total) by mouth daily.   . Multiple Vitamin (MULTIVITAMIN) tablet Take 1 tablet by mouth every other day.    . nitroGLYCERIN (NITROSTAT) 0.4 MG SL tablet Place 1 tablet (0.4 mg total) under the tongue every 5 (five) minutes as needed.   . triamcinolone cream (KENALOG) 0.1 % APPLY TO AFFECTED AREAS ON BODY TWICE DAILY TO AFFECTED AREAS AS NEEDED   . Vitamin D, Cholecalciferol, 1000 units TABS Take 2,000 Units by mouth daily.    No facility-administered encounter medications on file as of 12/11/2020.    Current Diagnosis: Patient Active Problem List   Diagnosis Date Noted  . MDD (major depressive disorder), recurrent episode, moderate ([place]) 07/08/2020  . Chronic insomnia 07/08/2020  . Chronic diarrhea 03/30/2018  .  GAD (generalized anxiety disorder) 01/31/2018  . Osteopenia 12/08/2017  . Vitamin D deficiency 03/04/2016  . Allergic rhinitis 03/04/2016  . Diabetes mellitus without complication ([place]) 16/55/3748  . External hemorrhoid 10/20/2015  . Psoriasis 10/20/2015  . History of bilateral knee replacement 10/20/2015  . Osteoarthritis of knee 11/25/2014  . Hypertension associated with diabetes ([place]) 11/06/2010  . CAD, NATIVE VESSEL 11/06/2010  . Hyperlipidemia associated with type 2 diabetes mellitus ([place]) 01/25/2006  . Adult hypothyroidism 11/15/2005      Follow-Up:  Patient Assistance Coordination and Pharmacist Review   Debbora Dus, CPP notified  Margaretmary Dys, [place] Pharmacy Assistant [phone]

## 2020-12-15 NOTE — Telephone Encounter (Signed)
[facility] application faxed to [phone].

## 2020-12-17 DIAGNOSIS — Z1231 Encounter for screening mammogram for malignant neoplasm of breast: Secondary | ICD-10-CM | POA: Diagnosis not present

## 2020-12-17 DIAGNOSIS — Z9289 Personal history of other medical treatment: Secondary | ICD-10-CM | POA: Diagnosis not present

## 2020-12-17 LAB — HM MAMMOGRAPHY

## 2020-12-22 DIAGNOSIS — M791 Myalgia, unspecified site: Secondary | ICD-10-CM | POA: Insufficient documentation

## 2020-12-22 NOTE — Assessment & Plan Note (Signed)
?   Med SE ( hold zetia) vs inacitivity ( start home stretching) vs other.

## 2020-12-30 ENCOUNTER — Encounter: Payer: Self-pay | Admitting: Family Medicine

## 2021-01-01 ENCOUNTER — Telehealth: Payer: Self-pay

## 2021-01-01 NOTE — Telephone Encounter (Signed)
Patient assistance application for Trulicity 0.75 mg weekly has been faxed to [employer] 01/01/2021.  Phil Dopp, PharmD Clinical Pharmacist [facility] at [facility] [phone]

## 2021-01-05 ENCOUNTER — Other Ambulatory Visit: Payer: Self-pay | Admitting: *Deleted

## 2021-01-05 ENCOUNTER — Telehealth: Payer: Self-pay

## 2021-01-05 MED ORDER — TRULICITY 0.75 MG/0.5ML ~~LOC~~ SOAJ: 0.7500 mg | SUBCUTANEOUS | 3 refills | Status: DC

## 2021-01-05 NOTE — Chronic Care Management (AMB) (Addendum)
Chronic Care Management Pharmacy Assistant   Name: Renee [name]  MRN: [id number] DOB: [birth year]  Reason for Encounter: Medication Review / Renee [name] Cares/Patient Assistance Coordination  Patient Questions:  1.  Have you seen any other providers since your last visit? No   2.  Any changes in your medicines or health? No   PCP : Jinny Sanders, MD  Allergies:   Allergies  Allergen Reactions   Other Rash    Metal    Celexa [Citalopram] Other (See Comments)    Throat swelling and difficulty breathing   Codeine Nausea And Vomiting   Metformin Nausea And Vomiting   Robaxin [Methocarbamol] Nausea Only   Simvastatin Swelling and Rash    Medications: Outpatient Encounter Medications as of 01/05/2021  Medication Sig Note   ACCU-CHEK COMPACT PLUS test strip USE AS DIRECTED TO TEST BLOOD SUGAR DAILY    acetaminophen (TYLENOL) 650 MG CR tablet One tab as needed every 8 hours, 30 days    aspirin EC 81 MG tablet Take 81 mg by mouth daily.    Blood Glucose Monitoring Suppl (ACCU-CHEK COMPACT CARE KIT) KIT Meter Kit    clobetasol ointment (TEMOVATE) 5.46 % Apply 1 application topically daily as needed (psoriasis flares).    Continuous Blood Gluc Receiver (FREESTYLE LIBRE 14 DAY READER) DEVI Check blood sugar    Continuous Blood Gluc Sensor (FREESTYLE LIBRE 14 DAY SENSOR) MISC Check blood sugar E11.9    Dulaglutide (TRULICITY) 5.68 LE/7.5TZ SOPN Inject 0.75 mg into the skin once a week.    Emollient (CERAVE) CREA Apply 1 application topically daily as needed (psoriasis). 11/17/2016: Mix with triamcinolone   Flaxseed, Linseed, (FLAX SEED OIL PO) Take by mouth.    fluticasone (FLONASE) 50 MCG/ACT nasal spray Place 1 spray into both nostrils daily.    glipiZIDE (GLUCOTROL XL) 5 MG 24 hr tablet TAKE 1 TABLET BY MOUTH EVERY DAY WITH BREAKFAST    ibuprofen (ADVIL,MOTRIN) 200 MG tablet Take 200 mg by mouth every 6 (six) hours as needed for moderate pain.    levothyroxine (SYNTHROID) 75 MCG tablet  TAKE 1 TABLET DAILY    losartan (COZAAR) 50 MG tablet Take 1 tablet (50 mg total) by mouth daily.    Multiple Vitamin (MULTIVITAMIN) tablet Take 1 tablet by mouth every other day.     nitroGLYCERIN (NITROSTAT) 0.4 MG SL tablet Place 1 tablet (0.4 mg total) under the tongue every 5 (five) minutes as needed.    triamcinolone cream (KENALOG) 0.1 % APPLY TO AFFECTED AREAS ON BODY TWICE DAILY TO AFFECTED AREAS AS NEEDED    Vitamin D, Cholecalciferol, 1000 units TABS Take 2,000 Units by mouth daily.    No facility-administered encounter medications on file as of 01/05/2021.    Current Diagnosis: Patient Active Problem List   Diagnosis Date Noted   Myalgia 12/22/2020   MDD (major depressive disorder), recurrent episode, moderate ([place]) 07/08/2020   Chronic insomnia 07/08/2020   Chronic diarrhea 03/30/2018   GAD (generalized anxiety disorder) 01/31/2018   Osteopenia 12/08/2017   Vitamin D deficiency 03/04/2016   Allergic rhinitis 03/04/2016   Diabetes mellitus without complication ([place]) 00/17/4944   External hemorrhoid 10/20/2015   Psoriasis 10/20/2015   History of bilateral knee replacement 10/20/2015   Osteoarthritis of knee 11/25/2014   Hypertension associated with diabetes ([place]) 11/06/2010   CAD, NATIVE VESSEL 11/06/2010   Hyperlipidemia associated with type 2 diabetes mellitus ([place]) 01/25/2006   Adult hypothyroidism 11/15/2005   [place] regarding patient assistance application  faxed 01/01/2021. Stated that they did not have insurance information on patient. This was provided via telephone. Application submitted to processing.   Follow-Up:  Patient Assistance Coordination and Pharmacist Review   Debbora Dus, CPP notified  Margaretmary Dys, [place] Pharmacy Assistant [phone]

## 2021-01-15 ENCOUNTER — Encounter: Payer: Self-pay | Admitting: Family Medicine

## 2021-01-15 ENCOUNTER — Other Ambulatory Visit: Payer: Self-pay

## 2021-01-15 ENCOUNTER — Ambulatory Visit (INDEPENDENT_AMBULATORY_CARE_PROVIDER_SITE_OTHER): Payer: Medicare Other | Admitting: Family Medicine

## 2021-01-15 VITALS — BP 130/64 | HR 80 | Temp 97.4°F | Ht 63.0 in | Wt 150.2 lb

## 2021-01-15 DIAGNOSIS — Z789 Other specified health status: Secondary | ICD-10-CM | POA: Diagnosis not present

## 2021-01-15 DIAGNOSIS — E785 Hyperlipidemia, unspecified: Secondary | ICD-10-CM | POA: Diagnosis not present

## 2021-01-15 DIAGNOSIS — F411 Generalized anxiety disorder: Secondary | ICD-10-CM

## 2021-01-15 DIAGNOSIS — E1169 Type 2 diabetes mellitus with other specified complication: Secondary | ICD-10-CM | POA: Diagnosis not present

## 2021-01-15 DIAGNOSIS — E119 Type 2 diabetes mellitus without complications: Secondary | ICD-10-CM | POA: Diagnosis not present

## 2021-01-15 DIAGNOSIS — I152 Hypertension secondary to endocrine disorders: Secondary | ICD-10-CM | POA: Diagnosis not present

## 2021-01-15 DIAGNOSIS — E1159 Type 2 diabetes mellitus with other circulatory complications: Secondary | ICD-10-CM

## 2021-01-15 DIAGNOSIS — F331 Major depressive disorder, recurrent, moderate: Secondary | ICD-10-CM

## 2021-01-15 LAB — POCT GLYCOSYLATED HEMOGLOBIN (HGB A1C): Hemoglobin A1C: 7 % — AB (ref 4.0–5.6)

## 2021-01-15 NOTE — Assessment & Plan Note (Addendum)
No longer taking zetia or fenofibrate.. LDL at goal < 70 (last check in 09/2020) Intolerant of statins.  Check lipids at next appt in 3 months

## 2021-01-15 NOTE — Assessment & Plan Note (Signed)
Resolved now that  Stress is decreased and now that she is off cholesterol medication, no further body pain. GAD7 0

## 2021-01-15 NOTE — Assessment & Plan Note (Addendum)
Improved control with lifestyle changes.. continue diet changes.  She never started Trulicity or use CGM as were too costly.   Continue glucotrol XL 5 mg daily.. no hypoglycemia.

## 2021-01-15 NOTE — Progress Notes (Signed)
Patient ID: Renee [name], female    DOB: October 24, 1946, 75 y.o.   MRN: [id number]  This visit was conducted in person.  BP 130/64   Pulse 80   Temp (!) 97.4 F (36.3 C) (Temporal)   Ht _0  (1.6 m)   Wt 150 lb 3 oz (68.1 kg)   SpO2 97%   BMI 26.60 kg/m    CC:  Chief Complaint  Patient presents with  . Diabetes    Subjective:   HPI: Renee [name] is a 75 y.o. female presenting on 01/15/2021 for Diabetes  Diabetes:   Improved control on  Glucotrol 5 mg daily. Never started  trulicity 2.24 weekly.  Followed by [facility] pharmacist. Request sent to [facility] for continuous glucose meter.. but has not heard back from [facility].  She has stopped sweets, less starches.  Lab Results  Component Value Date   HGBA1C 7.0 (A) 01/15/2021  Using medications without difficulties: Hypoglycemic episodes: none Hyperglycemic episodes: none Feet problems: no ulcer Blood Sugars averaging: FBS 140 eye exam within last year: yes  Wt Readings from Last 3 Encounters:  01/15/21 150 lb 3 oz (68.1 kg)  12/08/20 154 lb (69.9 kg)  10/14/20 154 lb (69.9 kg)   Hypertension:   good control at home and in the office.. on losartan 50 mg daily BP Readings from Last 3 Encounters:  01/15/21 130/64  12/08/20 (!) 150/74  10/14/20 140/68  Using medication without problems or lightheadedness:  none Chest pain with exertion:none Edema: none Short of breath: none Average home BPs: Other issues:    MDD, GAD, chronic:  Never went to counseling last year but she states her children think she may still  need it. She says her mood is much better in general despite not taking any medication.  Relevant past medical, surgical, family and social history reviewed and updated as indicated. Interim medical history since our last visit reviewed. Allergies and medications reviewed and updated. Outpatient Medications Prior to Visit  Medication Sig Dispense Refill  . ACCU-CHEK COMPACT PLUS test strip USE AS  DIRECTED TO TEST BLOOD SUGAR DAILY 100 each 1  . acetaminophen (TYLENOL) 650 MG CR tablet One tab as needed every 8 hours, 30 days    . aspirin EC 81 MG tablet Take 81 mg by mouth daily.    . Blood Glucose Monitoring Suppl (ACCU-CHEK COMPACT CARE KIT) KIT Meter Kit 1 each 0  . clobetasol ointment (TEMOVATE) 8.25 % Apply 1 application topically daily as needed (psoriasis flares). 30 g 1  . Emollient (CERAVE) CREA Apply 1 application topically daily as needed (psoriasis).    . Flaxseed, Linseed, (FLAX SEED OIL PO) Take by mouth.    . fluticasone (FLONASE) 50 MCG/ACT nasal spray Place 1 spray into both nostrils daily.    Marland Kitchen glipiZIDE (GLUCOTROL XL) 5 MG 24 hr tablet TAKE 1 TABLET BY MOUTH EVERY DAY WITH BREAKFAST 90 tablet 1  . ibuprofen (ADVIL,MOTRIN) 200 MG tablet Take 200 mg by mouth every 6 (six) hours as needed for moderate pain.    Marland Kitchen levothyroxine (SYNTHROID) 75 MCG tablet TAKE 1 TABLET DAILY 90 tablet 3  . losartan (COZAAR) 50 MG tablet Take 1 tablet (50 mg total) by mouth daily. 90 tablet 3  . Multiple Vitamin (MULTIVITAMIN) tablet Take 1 tablet by mouth every other day.     . nitroGLYCERIN (NITROSTAT) 0.4 MG SL tablet Place 1 tablet (0.4 mg total) under the tongue every 5 (five) minutes as needed.  30 tablet 6  . triamcinolone cream (KENALOG) 0.1 % APPLY TO AFFECTED AREAS ON BODY TWICE DAILY TO AFFECTED AREAS AS NEEDED    . Vitamin D, Cholecalciferol, 1000 units TABS Take 2,000 Units by mouth daily.    . Continuous Blood Gluc Receiver (FREESTYLE LIBRE 14 DAY READER) DEVI Check blood sugar (Patient not taking: Reported on 01/15/2021) 1 each 0  . Continuous Blood Gluc Sensor (FREESTYLE LIBRE 14 DAY SENSOR) MISC Check blood sugar E11.9 (Patient not taking: Reported on 01/15/2021) 6 each 3  . Dulaglutide (TRULICITY) 1.65 VV/7.4MO SOPN Inject 0.75 mg into the skin once a week. (Patient not taking: Reported on 01/15/2021) 6.5 mL 3   No facility-administered medications prior to visit.     Per HPI  unless specifically indicated in ROS section below Review of Systems  Constitutional: Negative for fatigue and fever.  HENT: Negative for ear pain.   Eyes: Negative for pain.  Respiratory: Negative for chest tightness and shortness of breath.   Cardiovascular: Negative for chest pain, palpitations and leg swelling.  Gastrointestinal: Negative for abdominal pain.  Genitourinary: Negative for dysuria.   Objective:  BP 130/64   Pulse 80   Temp (!) 97.4 F (36.3 C) (Temporal)   Ht _0  (1.6 m)   Wt 150 lb 3 oz (68.1 kg)   SpO2 97%   BMI 26.60 kg/m   Wt Readings from Last 3 Encounters:  01/15/21 150 lb 3 oz (68.1 kg)  12/08/20 154 lb (69.9 kg)  10/14/20 154 lb (69.9 kg)      Physical Exam Constitutional:      General: She is not in acute distress.Vital signs are normal.     Appearance: Normal appearance. She is well-developed and well-nourished. She is not ill-appearing or toxic-appearing.  HENT:     Head: Normocephalic.     Right Ear: Hearing, tympanic membrane, ear canal and external ear normal. Tympanic membrane is not erythematous, retracted or bulging.     Left Ear: Hearing, tympanic membrane, ear canal and external ear normal. Tympanic membrane is not erythematous, retracted or bulging.     Nose: No mucosal edema or rhinorrhea.     Right Sinus: No maxillary sinus tenderness or frontal sinus tenderness.     Left Sinus: No maxillary sinus tenderness or frontal sinus tenderness.     Mouth/Throat:     Mouth: Oropharynx is clear and moist and mucous membranes are normal.     Pharynx: Uvula midline.  Eyes:     General: Lids are normal. Lids are everted, no foreign bodies appreciated.     Extraocular Movements: EOM normal.     Conjunctiva/sclera: Conjunctivae normal.     Pupils: Pupils are equal, round, and reactive to light.  Neck:     Thyroid: No thyroid mass or thyromegaly.     Vascular: No carotid bruit.     Trachea: Trachea normal.  Cardiovascular:     Rate and  Rhythm: Normal rate and regular rhythm.     Pulses: Normal pulses and intact distal pulses.     Heart sounds: Normal heart sounds, S1 normal and S2 normal. No murmur heard. No friction rub. No gallop.   Pulmonary:     Effort: Pulmonary effort is normal. No tachypnea or respiratory distress.     Breath sounds: Normal breath sounds. No decreased breath sounds, wheezing, rhonchi or rales.  Abdominal:     General: Bowel sounds are normal.     Palpations: Abdomen is soft.  Tenderness: There is no abdominal tenderness.  Musculoskeletal:     Cervical back: Normal range of motion and neck supple.  Skin:    General: Skin is warm, dry and intact.     Findings: No rash.  Neurological:     Mental Status: She is alert.  Psychiatric:        Mood and Affect: Mood is not anxious or depressed.        Speech: Speech normal.        Behavior: Behavior normal. Behavior is cooperative.        Thought Content: Thought content normal.        Cognition and Memory: Cognition and memory normal.        Judgment: Judgment normal.       Results for orders placed or performed in visit on 01/15/21  POCT glycosylated hemoglobin (Hb A1C)  Result Value Ref Range   Hemoglobin A1C 7.0 (A) 4.0 - 5.6 %   HbA1c POC (<> result, manual entry)     HbA1c, POC (prediabetic range)     HbA1c, POC (controlled diabetic range)      This visit occurred during the SARS-CoV-2 public health emergency.  Safety protocols were in place, including screening questions prior to the visit, additional usage of staff PPE, and extensive cleaning of exam room while observing appropriate contact time as indicated for disinfecting solutions.   COVID 19 screen:  No recent travel or known exposure to COVID19 The patient denies respiratory symptoms of COVID 19 at this time. The importance of social distancing was discussed today.   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 continue diet changes.  She never started Trulicity or use CGM as were too costly.   Continue glucotrol XL 5 mg daily.. no hypoglycemia.        GAD (generalized anxiety disorder)    Resolved now that  Stress is decreased and now that she is off cholesterol medication, no further body pain. GAD7 0      Hyperlipidemia associated with type 2 diabetes mellitus ([place])    No longer taking zetia or fenofibrate.. LDL at goal < 70 (last check in 09/2020) Intolerant of statins.  Check lipids at next appt in 3 months      Hypertension associated with diabetes ([place])    Stable, chronic.  Continue current medication.   Losartan 50 mg daily.      MDD (major depressive disorder), recurrent episode, moderate ([place])     Resolved now that  Stress is decreased and now that she is off cholesterol medication, no further body pain. PHQ9: 0      Statin intolerance       Eliezer Lofts, MD

## 2021-01-15 NOTE — Patient Instructions (Addendum)
Keep up the great work on healthy eating and regular activity.

## 2021-01-15 NOTE — Assessment & Plan Note (Addendum)
Resolved now that  Stress is decreased and now that she is off cholesterol medication, no further body pain. PHQ9: 0

## 2021-01-15 NOTE — Assessment & Plan Note (Signed)
Stable, chronic.  Continue current medication.  Losartan 50 mg daily 

## 2021-01-22 ENCOUNTER — Telehealth: Payer: Self-pay

## 2021-01-22 NOTE — Telephone Encounter (Signed)
Contacted patient and left voicemail requesting call back 01/22/21 at 10 AM.  Patient approved for Trulicity 8.26 mg weekly assistance through manufacturer. Approval as of 01/08/21 for 12 month period. Reviewed chart and patient's A1c came down from 7.9% October 2021 to 7% January 2022 while only on glipizide. Unknown if patient has received contact from [employer] or started Trulicity. Attempted to contact patient to give her the option of switching glipizide to Trulicity or continuing glipizide monotherapy due to A1c improvement. Will attempt call again this afternoon.  Lab Results  Component Value Date/Time   HGBA1C 7.0 (A) 01/15/2021 11:21 AM   HGBA1C 7.9 (H) 10/07/2020 12:20 PM   HGBA1C 7.2 (H) 01/07/2020 12:16 PM    Debbora Dus, PharmD Clinical Pharmacist [facility] [facility] at [facility] [phone]

## 2021-01-22 NOTE — Telephone Encounter (Signed)
Patient called stating that she is having issues with getting billed for her Wellness Visit done in October 2021. She called billing department at [facility] and both of her insurances and was told by insurance they paid the portion for wellness of the amount $325 but patient is getting a bill for $325. Patient accounting with [facility] said they do not see any payments on this and her insurance also said she was told diagnoses was wrong and/or maybe additional codes were added for that visit.  Patient states this is the 4th year in a row she had to call and get all of this fixed. She does not understand the issue. Patient states if this is going to be an issue every time she will not be getting physicals done. Patient is frustrated. Spoke with Donzetta Matters and advised patient that Raquel Sarna will call her back after looking into this.

## 2021-01-22 NOTE — Telephone Encounter (Signed)
Patient returned call 01/22/21 11:30 AM   She is excited about improved A1c. Asked patient about switching glipizide for Trulicity - Reports she discussed with PCP working toward goal of being off all medications for diabetes. She will continue glipizide 5 mg XL daily with breakfast at this time. Follow up with PCP in April 2022 for repeat A1c. She is really watching her diet and not having any sweets. We discussed she will not start Trulicity at this time but application approved for 12 months if needed later this year. She reports her goal is for her next A1c to 6%.  Debbora Dus, PharmD Clinical Pharmacist [place] [facility] at [facility] [phone]

## 2021-02-04 ENCOUNTER — Telehealth: Payer: Self-pay

## 2021-02-04 NOTE — Chronic Care Management (AMB) (Addendum)
Chronic Care Management Pharmacy Assistant   Name: Renee [name]  MRN: [id number] DOB: 1946-02-01  Reason for Encounter: Disease State- Diabetes  Patient Question:  1.  Have you seen any other providers or had any medication changes since your last visit with Debbora Dus, Pharm. D? Yes  01/15/21- Dr. Eliezer Lofts- PCP- Discontinued Trulicity  PCP : Jinny Sanders, MD  Allergies:   Allergies  Allergen Reactions   Other Rash    Metal    Celexa [Citalopram] Other (See Comments)    Throat swelling and difficulty breathing   Codeine Nausea And Vomiting   Metformin Nausea And Vomiting   Robaxin [Methocarbamol] Nausea Only   Simvastatin Swelling and Rash    Medications: Outpatient Encounter Medications as of 02/04/2021  Medication Sig Note   ACCU-CHEK COMPACT PLUS test strip USE AS DIRECTED TO TEST BLOOD SUGAR DAILY    acetaminophen (TYLENOL) 650 MG CR tablet One tab as needed every 8 hours, 30 days    aspirin EC 81 MG tablet Take 81 mg by mouth daily.    Blood Glucose Monitoring Suppl (ACCU-CHEK COMPACT CARE KIT) KIT Meter Kit    clobetasol ointment (TEMOVATE) 9.83 % Apply 1 application topically daily as needed (psoriasis flares).    Emollient (CERAVE) CREA Apply 1 application topically daily as needed (psoriasis). 11/17/2016: Mix with triamcinolone   Flaxseed, Linseed, (FLAX SEED OIL PO) Take by mouth.    fluticasone (FLONASE) 50 MCG/ACT nasal spray Place 1 spray into both nostrils daily.    glipiZIDE (GLUCOTROL XL) 5 MG 24 hr tablet TAKE 1 TABLET BY MOUTH EVERY DAY WITH BREAKFAST    ibuprofen (ADVIL,MOTRIN) 200 MG tablet Take 200 mg by mouth every 6 (six) hours as needed for moderate pain.    levothyroxine (SYNTHROID) 75 MCG tablet TAKE 1 TABLET DAILY    losartan (COZAAR) 50 MG tablet Take 1 tablet (50 mg total) by mouth daily.    Multiple Vitamin (MULTIVITAMIN) tablet Take 1 tablet by mouth every other day.     nitroGLYCERIN (NITROSTAT) 0.4 MG SL tablet Place 1 tablet (0.4 mg  total) under the tongue every 5 (five) minutes as needed.    triamcinolone cream (KENALOG) 0.1 % APPLY TO AFFECTED AREAS ON BODY TWICE DAILY TO AFFECTED AREAS AS NEEDED    Vitamin D, Cholecalciferol, 1000 units TABS Take 2,000 Units by mouth daily.    No facility-administered encounter medications on file as of 02/04/2021.    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)  ([place]) 10/20/2015   External hemorrhoid 10/20/2015   Psoriasis 10/20/2015   History of bilateral knee replacement 10/20/2015   Osteoarthritis of knee 11/25/2014   Hypertension associated with diabetes ([place]) 11/06/2010   CAD, NATIVE VESSEL 11/06/2010   Hyperlipidemia associated with type 2 diabetes mellitus ([place]) 01/25/2006   Adult hypothyroidism 11/15/2005    Recent Relevant Labs: Lab Results  Component Value Date/Time   HGBA1C 7.0 (A) 01/15/2021 11:21 AM   HGBA1C 7.9 (H) 10/07/2020 12:20 PM   HGBA1C 7.2 (H) 01/07/2020 12:16 PM    Kidney Function Lab Results  Component Value Date/Time  CREATININE 0.70 10/07/2020 12:20 PM   CREATININE 0.74 09/28/2019 08:54 AM   GFR 84.10 10/07/2020 12:20 PM   GFRNONAA >60 03/28/2018 07:21 AM   GFRAA >60 03/28/2018 07:21 AM    Current antihyperglycemic regimen:  Glipizide 5 mg XL - 1 tablet daily  What recent interventions/DTPs have been made to improve glycemic control:  Discontinued Trulicity - goal is to get off all diabetic medications.   Have there been any recent hospitalizations or ED visits since last visit with CPP? No  Patient reports  hypoglycemic symptoms, including Shaky and Vision changes and confusion. Only happened 1 time and was last week. Was not able to check blood sugar during that time. Able to eat a peppermint candy and her breakfast and started feeling better.   Patient denies hyperglycemic symptoms, including blurry vision, excessive thirst, fatigue, polyuria and weakness   How often are you checking your blood sugar? Twice a week States she has trouble sticking her fingers due to psoriasis. Sometimes she is unable to check blood sugar frequently due to this.   What are your blood sugars ranging?  Fasting:  02/09/21- 128 02/04/21- 135 02/01/21- 143 01/28/21- 119  Before meals: N/A After meals: N/A Bedtime: N/A  On insulin? No  During the week, how often does your blood glucose drop below 70? Never  Are you checking your feet daily/regularly? Yes  Adherence Review: Is the patient currently on a STATIN medication? No Is the patient currently on ACE/ARB medication? Yes Does the patient have >5 day gap between last estimated fill dates? CPP to review  Patient also able to provide recent blood pressure readings. She checks prior to meals and medications using arm cuff.   02/09/21- 141/77  No pulse reading  02/08/21- 135/83  No pulse reading   Patient states she is working very hard on avoiding sugar. She wants to eventually come off all diabetic medications. Patient notes she is still having an issue with billing and has reached out to Dr. Rometta Emery office. She states she has both Medicare and [employer] and she should not have any bills for medical.   Follow-Up:  Pharmacist Review  Debbora Dus, CPP notified  Margaretmary Dys, [place] Assistant [phone]  I have reviewed the care management and care coordination activities outlined in this encounter and I am certifying that I agree with the content of this note. Billing issue documented in chart and sent to Donzetta Matters 01/22/21. Will  check status.   Debbora Dus, PharmD Clinical Pharmacist [facility] at [facility] [phone]

## 2021-02-13 NOTE — Addendum Note (Signed)
Addended by: Eliezer Lofts E on: 02/13/2021 05:31 PM   Modules accepted: Level of Service

## 2021-02-13 NOTE — Telephone Encounter (Signed)
Patient inquiring about an update on this issue.  Debbora Dus, PharmD Clinical Pharmacist [facility] at [facility] [phone]

## 2021-02-16 NOTE — Telephone Encounter (Signed)
Attempted to reach pt to discuss. Call was not able to be completed. Received error message from [place] stating call could not be completed as dialed. After investigation, there has been an adjustment made and this has been refiled with insurance. Will keep an eye on this to see if it is fixed after adjustments.

## 2021-03-24 ENCOUNTER — Telehealth: Payer: Self-pay

## 2021-03-24 NOTE — Chronic Care Management (AMB) (Signed)
Chronic Care Management Pharmacy Assistant   Name: Renee [name]  MRN: [id number] DOB: April 21, 1946   Reason for Encounter: Disease State- Diabetes  Recent office visits:  None since last CCM contact  Recent consult visits:  None since last CCM contact  Hospital visits:  None in previous 6 months  Medications: Outpatient Encounter Medications as of 03/24/2021  Medication Sig Note  . ACCU-CHEK COMPACT PLUS test strip USE AS DIRECTED TO TEST BLOOD SUGAR DAILY   . acetaminophen (TYLENOL) 650 MG CR tablet One tab as needed every 8 hours, 30 days   . aspirin EC 81 MG tablet Take 81 mg by mouth daily.   . Blood Glucose Monitoring Suppl (ACCU-CHEK COMPACT CARE KIT) KIT Meter Kit   . clobetasol ointment (TEMOVATE) 3.61 % Apply 1 application topically daily as needed (psoriasis flares).   . Emollient (CERAVE) CREA Apply 1 application topically daily as needed (psoriasis). 11/17/2016: Mix with triamcinolone  . Flaxseed, Linseed, (FLAX SEED OIL PO) Take by mouth.   . fluticasone (FLONASE) 50 MCG/ACT nasal spray Place 1 spray into both nostrils daily.   Marland Kitchen glipiZIDE (GLUCOTROL XL) 5 MG 24 hr tablet TAKE 1 TABLET BY MOUTH EVERY DAY WITH BREAKFAST   . ibuprofen (ADVIL,MOTRIN) 200 MG tablet Take 200 mg by mouth every 6 (six) hours as needed for moderate pain.   Marland Kitchen levothyroxine (SYNTHROID) 75 MCG tablet TAKE 1 TABLET DAILY   . losartan (COZAAR) 50 MG tablet Take 1 tablet (50 mg total) by mouth daily.   . Multiple Vitamin (MULTIVITAMIN) tablet Take 1 tablet by mouth every other day.    . nitroGLYCERIN (NITROSTAT) 0.4 MG SL tablet Place 1 tablet (0.4 mg total) under the tongue every 5 (five) minutes as needed.   . triamcinolone cream (KENALOG) 0.1 % APPLY TO AFFECTED AREAS ON BODY TWICE DAILY TO AFFECTED AREAS AS NEEDED   . Vitamin D, Cholecalciferol, 1000 units TABS Take 2,000 Units by mouth daily.    No facility-administered encounter medications on file as of 03/24/2021.     Recent Relevant  Labs: Lab Results  Component Value Date/Time   HGBA1C 7.0 (A) 01/15/2021 11:21 AM   HGBA1C 7.9 (H) 10/07/2020 12:20 PM   HGBA1C 7.2 (H) 01/07/2020 12:16 PM    Kidney Function Lab Results  Component Value Date/Time   CREATININE 0.70 10/07/2020 12:20 PM   CREATININE 0.74 09/28/2019 08:54 AM   GFR 84.10 10/07/2020 12:20 PM   GFRNONAA >60 03/28/2018 07:21 AM   GFRAA >60 03/28/2018 07:21 AM   Unable to reach patient on 03/24/21. Able to reach patient on 3/30 to discuss blood sugars.  . Current antihyperglycemic regimen:   Glipizide 5 mg XL - 1 tablet daily (States she stopped taking about 3 weeks due to low blood sugars- states the blood sugars were under 100)   . What recent interventions/DTPs have been made to improve glycemic control:  o No changes  . Have there been any recent hospitalizations or ED visits since last visit with CPP? No  . Patient reports hypoglycemic symptoms, including Vision changes  . Patient denies hyperglycemic symptoms, including blurry vision, excessive thirst, fatigue, polyuria and weakness  . How often are you checking your blood sugar? once daily  . What are your blood sugars ranging?  o Fasting:  - 03/08/21- 150 - 03/09/21- 147 - 03/14/21- 153 - 03/20/21- 149 o Before meals: N/A o After meals: N/A o Bedtime:  - 03/24/21- 131  . On insulin? No  .  During the week, how often does your blood glucose drop below 70? Never  . Are you checking your feet daily/regularly? Yes  Adherence Review: Is the patient currently on a STATIN medication? No Is the patient currently on ACE/ARB medication? Yes Does the patient have >5 day gap between last estimated fill dates? CPP to review  Star Rating Drugs:  Medication:  Last Fill: Day Supply Glipizide 5 mg  11/08/20 90 Losartan 50 mg 12/21/20 90  Patient states she has also been checking blood pressure also. Readings are as follows: 03/08/21- 158/80 03/09/21- 158/88 03/14/21- 153/75 03/19/21-  130/66 03/20/21- 144/74 03/24/21- 121/66  Patient stopped taking her glipizide about 3 weeks ago because she felt her blood sugar was too low. She states her readings were between 80-100. She states that when her blood sugar is below 100 her vision is blurry so she would prefer it be a little higher.    Follow-Up:  Pharmacist Review  Debbora Dus, CPP notified  Margaretmary Dys, [place] [phone]  Total time spent for month: CPA: 40

## 2021-03-25 NOTE — Telephone Encounter (Signed)
I have reviewed the care management and care coordination activities outlined in this encounter and I am certifying that I agree with the content of this note. BG elevated off glipizide but pt with side effects on therapy. Okay to remain off for now. Re-eval with PCP in 30 days. Bp seems to be improving this week.   Pt to follow up with PCP April 17, 2021.   Debbora Dus, PharmD Clinical Pharmacist [facility] at [facility] [phone]

## 2021-03-25 NOTE — Telephone Encounter (Signed)
Agreed -

## 2021-04-01 ENCOUNTER — Telehealth: Payer: Self-pay | Admitting: Family Medicine

## 2021-04-01 DIAGNOSIS — E559 Vitamin D deficiency, unspecified: Secondary | ICD-10-CM

## 2021-04-01 DIAGNOSIS — E119 Type 2 diabetes mellitus without complications: Secondary | ICD-10-CM

## 2021-04-01 DIAGNOSIS — E039 Hypothyroidism, unspecified: Secondary | ICD-10-CM

## 2021-04-01 NOTE — Telephone Encounter (Signed)
-----   Message from Ellamae Sia sent at 03/30/2021 10:51 AM EDT ----- Regarding: Lab orders for Monday, 4.18.22 Lab orders for a 3 month follow up appt.

## 2021-04-13 ENCOUNTER — Other Ambulatory Visit: Payer: Medicare Other

## 2021-04-17 ENCOUNTER — Ambulatory Visit: Payer: Medicare Other | Admitting: Family Medicine

## 2021-04-27 ENCOUNTER — Other Ambulatory Visit (INDEPENDENT_AMBULATORY_CARE_PROVIDER_SITE_OTHER): Payer: Medicare Other

## 2021-04-27 ENCOUNTER — Other Ambulatory Visit: Payer: Self-pay

## 2021-04-27 ENCOUNTER — Other Ambulatory Visit: Payer: Medicare Other

## 2021-04-27 DIAGNOSIS — E119 Type 2 diabetes mellitus without complications: Secondary | ICD-10-CM | POA: Diagnosis not present

## 2021-04-27 LAB — COMPREHENSIVE METABOLIC PANEL
ALT: 18 U/L (ref 0–35)
AST: 20 U/L (ref 0–37)
Albumin: 4.2 g/dL (ref 3.5–5.2)
Alkaline Phosphatase: 52 U/L (ref 39–117)
BUN: 12 mg/dL (ref 6–23)
CO2: 30 mEq/L (ref 19–32)
Calcium: 9.6 mg/dL (ref 8.4–10.5)
Chloride: 104 mEq/L (ref 96–112)
Creatinine, Ser: 0.71 mg/dL (ref 0.40–1.20)
GFR: 82.46 mL/min (ref >60.00)
Glucose, Bld: 172 mg/dL — ABNORMAL HIGH (ref 70–99)
Potassium: 4 mEq/L (ref 3.5–5.1)
Sodium: 141 mEq/L (ref 135–145)
Total Bilirubin: 0.4 mg/dL (ref 0.2–1.2)
Total Protein: 7.5 g/dL (ref 6.0–8.3)

## 2021-04-27 LAB — LIPID PANEL
Cholesterol: 211 mg/dL — ABNORMAL HIGH (ref 0–200)
HDL: 41.5 mg/dL (ref >39.00)
LDL Cholesterol: 140 mg/dL — ABNORMAL HIGH (ref 0–99)
NonHDL: 169.32
Total CHOL/HDL Ratio: 5
Triglycerides: 146 mg/dL (ref 0.0–149.0)
VLDL: 29.2 mg/dL (ref 0.0–40.0)

## 2021-04-27 LAB — HEMOGLOBIN A1C: Hgb A1c MFr Bld: 7.3 % — ABNORMAL HIGH (ref 4.6–6.5)

## 2021-04-27 NOTE — Progress Notes (Signed)
No critical labs need to be addressed urgently. We will discuss labs in detail at upcoming office visit.   

## 2021-05-04 ENCOUNTER — Telehealth: Payer: Self-pay

## 2021-05-04 NOTE — Chronic Care Management (AMB) (Addendum)
    Chronic Care Management Pharmacy Assistant   Name: Renee [name]  MRN: [id number] DOB: [birth year]  Reason for Encounter: Reminder for CCM Appointment 05/11/21    Medications: Outpatient Encounter Medications as of 05/04/2021  Medication Sig Note   ACCU-CHEK COMPACT PLUS test strip USE AS DIRECTED TO TEST BLOOD SUGAR DAILY    acetaminophen (TYLENOL) 650 MG CR tablet One tab as needed every 8 hours, 30 days    aspirin EC 81 MG tablet Take 81 mg by mouth daily.    Blood Glucose Monitoring Suppl (ACCU-CHEK COMPACT CARE KIT) KIT Meter Kit    clobetasol ointment (TEMOVATE) 3.57 % Apply 1 application topically daily as needed (psoriasis flares).    Emollient (CERAVE) CREA Apply 1 application topically daily as needed (psoriasis). 11/17/2016: Mix with triamcinolone   Flaxseed, Linseed, (FLAX SEED OIL PO) Take by mouth.    fluticasone (FLONASE) 50 MCG/ACT nasal spray Place 1 spray into both nostrils daily.    glipiZIDE (GLUCOTROL XL) 5 MG 24 hr tablet TAKE 1 TABLET BY MOUTH EVERY DAY WITH BREAKFAST    ibuprofen (ADVIL,MOTRIN) 200 MG tablet Take 200 mg by mouth every 6 (six) hours as needed for moderate pain.    levothyroxine (SYNTHROID) 75 MCG tablet TAKE 1 TABLET DAILY    losartan (COZAAR) 50 MG tablet Take 1 tablet (50 mg total) by mouth daily.    Multiple Vitamin (MULTIVITAMIN) tablet Take 1 tablet by mouth every other day.     nitroGLYCERIN (NITROSTAT) 0.4 MG SL tablet Place 1 tablet (0.4 mg total) under the tongue every 5 (five) minutes as needed.    triamcinolone cream (KENALOG) 0.1 % APPLY TO AFFECTED AREAS ON BODY TWICE DAILY TO AFFECTED AREAS AS NEEDED    Vitamin D, Cholecalciferol, 1000 units TABS Take 2,000 Units by mouth daily.    No facility-administered encounter medications on file as of 05/04/2021.    Renee [name] was contacted to remind her of her upcoming telephone visit with Debbora Dus on 05/11/21 at 8:30. Patient did not answer. Left message to remind her to have all  medications, supplements and any blood glucose and blood pressure readings available for review at appointment.   Are you having any problems with your medications?- Unable to reach patient  What concerns would you like to discuss with the pharmacist?- Unable to reach patient    Star Rating Drugs: Medication:  Last Fill: Day Supply Glipizide 5 mg  02/04/21  90 Losartan 50 mg  04/19/21 [place], CPP notified  Margaretmary Dys, [place] Assistant [phone]  I have reviewed the care management and care coordination activities outlined in this encounter and I am certifying that I agree with the content of this note. No further action required.  Debbora Dus, PharmD Clinical Pharmacist [facility] at [facility] [phone]

## 2021-05-05 ENCOUNTER — Other Ambulatory Visit: Payer: Self-pay

## 2021-05-05 ENCOUNTER — Ambulatory Visit (INDEPENDENT_AMBULATORY_CARE_PROVIDER_SITE_OTHER): Payer: Medicare Other | Admitting: Family Medicine

## 2021-05-05 ENCOUNTER — Encounter: Payer: Self-pay | Admitting: Family Medicine

## 2021-05-05 VITALS — BP 140/66 | HR 97 | Temp 97.7°F | Ht 63.0 in | Wt 149.5 lb

## 2021-05-05 DIAGNOSIS — E1169 Type 2 diabetes mellitus with other specified complication: Secondary | ICD-10-CM

## 2021-05-05 DIAGNOSIS — L409 Psoriasis, unspecified: Secondary | ICD-10-CM

## 2021-05-05 DIAGNOSIS — E785 Hyperlipidemia, unspecified: Secondary | ICD-10-CM

## 2021-05-05 DIAGNOSIS — E1159 Type 2 diabetes mellitus with other circulatory complications: Secondary | ICD-10-CM | POA: Diagnosis not present

## 2021-05-05 DIAGNOSIS — I152 Hypertension secondary to endocrine disorders: Secondary | ICD-10-CM | POA: Diagnosis not present

## 2021-05-05 DIAGNOSIS — E119 Type 2 diabetes mellitus without complications: Secondary | ICD-10-CM

## 2021-05-05 MED ORDER — CLOBETASOL PROPIONATE 0.05 % EX OINT: 1.0000 "application " | TOPICAL_OINTMENT | Freq: Every day | CUTANEOUS | 1 refills | Status: DC | PRN

## 2021-05-05 MED ORDER — EZETIMIBE 10 MG PO TABS: 10.0000 mg | ORAL_TABLET | Freq: Every day | ORAL | 3 refills | Status: DC

## 2021-05-05 NOTE — Progress Notes (Signed)
Patient ID: Renee [name], female    DOB: 1946-11-17, 75 y.o.   MRN: [id number]  This visit was conducted in person.  BP 140/66   Pulse 97   Temp 97.7 F (36.5 C) (Temporal)   Ht $R'5\' 3"'Yp$  (1.6 m)   Wt 149 lb 8 oz (67.8 kg)   SpO2 97%   BMI 26.48 kg/m    CC:  Chief Complaint  Patient presents with  . Diabetes    Subjective:   HPI: [name] is a 75 y.o. female presenting on 05/05/2021 for Diabetes   Diabetes:   Slight worsening of control in last 3 months.. on glucotrol 5 mg daily  She feels that this increase in A1C is due to having COVID for 3 weeks in 03/2021... was not watching her diet. Drank a lot of cranberry, gatorade  Sugar free  Lab Results  Component Value Date   HGBA1C 7.3 (H) 04/27/2021  Using medications without difficulties: Hypoglycemic episodes: Hyperglycemic episodes: Feet problems: no ulcers Blood Sugars averaging: FBS 150 eye exam within last year: due  She has no further shortness of breath.  Hypertension:  BP Readings from Last 3 Encounters:  05/05/21 140/66  01/15/21 130/64  12/08/20 (!) 150/74  Using medication without problems or lightheadedness:  Chest pain with exertion: none Edema:none Short of breath:none Average home BPs: Other issues:  Elevated Cholesterol:  Cannot tolerate statins. Stopped zetia as well as fenofibrate. Lab Results  Component Value Date   CHOL 211 (H) 04/27/2021   HDL 41.50 04/27/2021   LDLCALC 140 (H) 04/27/2021   TRIG 146.0 04/27/2021   CHOLHDL 5 04/27/2021   Using medications without problems: Muscle aches:  Diet compliance:moderate Exercise: minimal given recent COVID Other complaints:       Relevant past medical, surgical, family and social history reviewed and updated as indicated. Interim medical history since our last visit reviewed. Allergies and medications reviewed and updated. Outpatient Medications Prior to Visit  Medication Sig Dispense Refill  . ACCU-CHEK COMPACT PLUS test strip  USE AS DIRECTED TO TEST BLOOD SUGAR DAILY 100 each 1  . acetaminophen (TYLENOL) 650 MG CR tablet One tab as needed every 8 hours, 30 days    . aspirin EC 81 MG tablet Take 81 mg by mouth daily.    . Blood Glucose Monitoring Suppl (ACCU-CHEK COMPACT CARE KIT) KIT Meter Kit 1 each 0  . clobetasol ointment (TEMOVATE) 6.57 % Apply 1 application topically daily as needed (psoriasis flares). 30 g 1  . Emollient (CERAVE) CREA Apply 1 application topically daily as needed (psoriasis).    . Flaxseed, Linseed, (FLAX SEED OIL PO) Take by mouth.    . fluticasone (FLONASE) 50 MCG/ACT nasal spray Place 1 spray into both nostrils daily.    Marland Kitchen ibuprofen (ADVIL,MOTRIN) 200 MG tablet Take 200 mg by mouth every 6 (six) hours as needed for moderate pain.    Marland Kitchen levothyroxine (SYNTHROID) 75 MCG tablet TAKE 1 TABLET DAILY 90 tablet 3  . losartan (COZAAR) 50 MG tablet Take 1 tablet (50 mg total) by mouth daily. 90 tablet 3  . Multiple Vitamin (MULTIVITAMIN) tablet Take 1 tablet by mouth every other day.     . nitroGLYCERIN (NITROSTAT) 0.4 MG SL tablet Place 1 tablet (0.4 mg total) under the tongue every 5 (five) minutes as needed. 30 tablet 6  . triamcinolone cream (KENALOG) 0.1 % APPLY TO AFFECTED AREAS ON BODY TWICE DAILY TO AFFECTED AREAS AS NEEDED    . Vitamin  D, Cholecalciferol, 1000 units TABS Take 2,000 Units by mouth daily.    Marland Kitchen glipiZIDE (GLUCOTROL XL) 5 MG 24 hr tablet TAKE 1 TABLET BY MOUTH EVERY DAY WITH BREAKFAST (Patient not taking: Reported on 05/05/2021) 90 tablet 1   No facility-administered medications prior to visit.     Per HPI unless specifically indicated in ROS section below Review of Systems  Constitutional: Negative for fatigue and fever.  HENT: Negative for congestion.   Eyes: Negative for pain.  Respiratory: Negative for cough and shortness of breath.   Cardiovascular: Negative for chest pain, palpitations and leg swelling.  Gastrointestinal: Negative for abdominal pain.  Genitourinary:  Negative for dysuria and vaginal bleeding.  Musculoskeletal: Negative for back pain.  Neurological: Negative for syncope, light-headedness and headaches.  Psychiatric/Behavioral: Negative for dysphoric mood.   Objective:  BP 140/66   Pulse 97   Temp 97.7 F (36.5 C) (Temporal)   Ht $R'5\' 3"'Je$  (1.6 m)   Wt 149 lb 8 oz (67.8 kg)   SpO2 97%   BMI 26.48 kg/m   Wt Readings from Last 3 Encounters:  05/05/21 149 lb 8 oz (67.8 kg)  01/15/21 150 lb 3 oz (68.1 kg)  12/08/20 154 lb (69.9 kg)      Physical Exam Constitutional:      General: She is not in acute distress.    Appearance: Normal appearance. She is well-developed. She is not ill-appearing or toxic-appearing.  HENT:     Head: Normocephalic.     Right Ear: Hearing, tympanic membrane, ear canal and external ear normal. Tympanic membrane is not erythematous, retracted or bulging.     Left Ear: Hearing, tympanic membrane, ear canal and external ear normal. Tympanic membrane is not erythematous, retracted or bulging.     Nose: No mucosal edema or rhinorrhea.     Right Sinus: No maxillary sinus tenderness or frontal sinus tenderness.     Left Sinus: No maxillary sinus tenderness or frontal sinus tenderness.     Mouth/Throat:     Pharynx: Uvula midline.  Eyes:     General: Lids are normal. Lids are everted, no foreign bodies appreciated.     Conjunctiva/sclera: Conjunctivae normal.     Pupils: Pupils are equal, round, and reactive to light.  Neck:     Thyroid: No thyroid mass or thyromegaly.     Vascular: No carotid bruit.     Trachea: Trachea normal.  Cardiovascular:     Rate and Rhythm: Normal rate and regular rhythm.     Pulses: Normal pulses.     Heart sounds: Normal heart sounds, S1 normal and S2 normal. No murmur heard. No friction rub. No gallop.   Pulmonary:     Effort: Pulmonary effort is normal. No tachypnea or respiratory distress.     Breath sounds: Normal breath sounds. No decreased breath sounds, wheezing, rhonchi or  rales.  Abdominal:     General: Bowel sounds are normal.     Palpations: Abdomen is soft.     Tenderness: There is no abdominal tenderness.  Musculoskeletal:     Cervical back: Normal range of motion and neck supple.  Skin:    General: Skin is warm and dry.     Findings: No rash.  Neurological:     Mental Status: She is alert.  Psychiatric:        Mood and Affect: Mood is not anxious or depressed.        Speech: Speech normal.  Behavior: Behavior normal. Behavior is cooperative.        Thought Content: Thought content normal.        Judgment: Judgment normal.       Results for orders placed or performed in visit on 04/27/21  Comprehensive metabolic panel  Result Value Ref Range   Sodium 141 135 - 145 mEq/L   Potassium 4.0 3.5 - 5.1 mEq/L   Chloride 104 96 - 112 mEq/L   CO2 30 19 - 32 mEq/L   Glucose, Bld 172 (H) 70 - 99 mg/dL   BUN 12 6 - 23 mg/dL   Creatinine, Ser 0.71 0.40 - 1.20 mg/dL   Total Bilirubin 0.4 0.2 - 1.2 mg/dL   Alkaline Phosphatase 52 39 - 117 U/L   AST 20 0 - 37 U/L   ALT 18 0 - 35 U/L   Total Protein 7.5 6.0 - 8.3 g/dL   Albumin 4.2 3.5 - 5.2 g/dL   GFR 82.46 >60.00 mL/min   Calcium 9.6 8.4 - 10.5 mg/dL  Lipid panel  Result Value Ref Range   Cholesterol 211 (H) 0 - 200 mg/dL   Triglycerides 146.0 0.0 - 149.0 mg/dL   HDL 41.50 >39.00 mg/dL   VLDL 29.2 0.0 - 40.0 mg/dL   LDL Cholesterol 140 (H) 0 - 99 mg/dL   Total CHOL/HDL Ratio 5    NonHDL 169.32   Hemoglobin A1c  Result Value Ref Range   Hgb A1c MFr Bld 7.3 (H) 4.6 - 6.5 %    This visit occurred during the SARS-CoV-2 public health emergency.  Safety protocols were in place, including screening questions prior to the visit, additional usage of staff PPE, and extensive cleaning of exam room while observing appropriate contact time as indicated for disinfecting solutions.   COVID 19 screen:  No recent travel or known exposure to COVID19 The patient denies respiratory symptoms of COVID 19  at this time. The importance of social distancing was discussed today.   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        Relevant Medications   ezetimibe (ZETIA) 10 MG tablet   Hyperlipidemia associated with type 2 diabetes mellitus (HCC)    Worsened, chronic. Restart Zetia given poor control.      Relevant Medications   ezetimibe (ZETIA) 10 MG tablet   Hypertension associated with diabetes ([place])    Stable, chronic.  Continue current medication.   Tolerable control at her age on losartan 50 mg daily      Relevant Medications   ezetimibe (ZETIA) 10 MG tablet   Psoriasis    Worsened, chronic; Refilled topical steroid for rash on hands.       Other Visit Diagnoses    Diabetes mellitus without complication ([place])    -  Primary       Eliezer Lofts, MD

## 2021-05-05 NOTE — Patient Instructions (Addendum)
Set up yearly eye exam for diabetes and have the opthalmologist send [place] a copy of the evaluation for the chart. Get back on track with healthy low carb diet and regular exercise.   If sugars fasting not < 120.Marland Kitchen restart glipizide daily.  Get back to walking as able.

## 2021-05-05 NOTE — Assessment & Plan Note (Addendum)
Worsened control, chronic Better controlled before had COVID.Marland Kitchen she will get back on track with lifestlye changes.Marland Kitchen if FBS not at goal she will restart glucotrol daily.

## 2021-05-05 NOTE — Assessment & Plan Note (Signed)
Stable, chronic.  Continue current medication.   Tolerable control at her age on losartan 50 mg daily

## 2021-05-05 NOTE — Assessment & Plan Note (Addendum)
Worsened, chronic; Refilled topical steroid for rash on hands.

## 2021-05-05 NOTE — Assessment & Plan Note (Addendum)
Worsened, chronic. Restart Zetia given poor control.

## 2021-05-08 ENCOUNTER — Other Ambulatory Visit: Payer: Self-pay | Admitting: Family Medicine

## 2021-05-11 ENCOUNTER — Ambulatory Visit (INDEPENDENT_AMBULATORY_CARE_PROVIDER_SITE_OTHER): Payer: Medicare Other

## 2021-05-11 ENCOUNTER — Other Ambulatory Visit: Payer: Self-pay

## 2021-05-11 DIAGNOSIS — E1169 Type 2 diabetes mellitus with other specified complication: Secondary | ICD-10-CM | POA: Diagnosis not present

## 2021-05-11 DIAGNOSIS — E785 Hyperlipidemia, unspecified: Secondary | ICD-10-CM

## 2021-05-11 DIAGNOSIS — E1159 Type 2 diabetes mellitus with other circulatory complications: Secondary | ICD-10-CM | POA: Diagnosis not present

## 2021-05-11 DIAGNOSIS — I152 Hypertension secondary to endocrine disorders: Secondary | ICD-10-CM | POA: Diagnosis not present

## 2021-05-11 NOTE — Progress Notes (Signed)
Chronic Care Management Pharmacy Note  05/11/2021 Name:  Renee [name] MRN:  [id number] DOB:  04-16-1946  Subjective: Renee [name] is an 75 y.o. year old female who is a primary patient of Bedsole, Amy E, MD.  The CCM team was consulted for assistance with disease management and care coordination needs.    Engaged with patient by telephone for follow up visit in response to provider referral for pharmacy case management and/or care coordination services.   Consent to Services:  The patient was given information about Chronic Care Management services, agreed to services, and gave verbal consent prior to initiation of services.  Please see initial visit note for detailed documentation.   Patient Care Team: Jinny Sanders, MD as PCP - General (Family Medicine) Gaynelle Arabian, MD as Consulting Physician (Orthopedic Surgery) Minna Merritts, MD as Consulting Physician (Cardiology) Dasher, Rayvon Char, MD as Consulting Physician (Dermatology) Birder Robson, MD as Referring Physician (Ophthalmology) Debbora Dus, [facility] as Pharmacist (Pharmacist)  Recent office visits: 05/05/21 - Restart glipizide if fasting BG > 120. Schedule annual diabetic eye exam. Start Zetia 10 mg daily.  Recent consult visits: None since last ccm call  Hospital visits: None in previous 6 months  Objective:  Lab Results  Component Value Date   CREATININE 0.71 04/27/2021   BUN 12 04/27/2021   GFR 82.46 04/27/2021   GFRNONAA >60 03/28/2018   GFRAA >60 03/28/2018   NA 141 04/27/2021   K 4.0 04/27/2021   CALCIUM 9.6 04/27/2021   CO2 30 04/27/2021   GLUCOSE 172 (H) 04/27/2021    Lab Results  Component Value Date/Time   HGBA1C 7.3 (H) 04/27/2021 10:41 AM   HGBA1C 7.0 (A) 01/15/2021 11:21 AM   HGBA1C 7.9 (H) 10/07/2020 12:20 PM   GFR 82.46 04/27/2021 10:41 AM   GFR 84.10 10/07/2020 12:20 PM    Last diabetic Eye exam:  Lab Results  Component Value Date/Time   HMDIABEYEEXA No Retinopathy 03/20/2020  12:00 AM    Last diabetic Foot exam:  Lab Results  Component Value Date/Time   HMDIABFOOTEX done 09/29/2020 12:00 AM     Lab Results  Component Value Date   CHOL 211 (H) 04/27/2021   HDL 41.50 04/27/2021   LDLCALC 140 (H) 04/27/2021   TRIG 146.0 04/27/2021   CHOLHDL 5 04/27/2021    Hepatic Function Latest Ref Rng & Units 04/27/2021 10/07/2020 09/28/2019  Total Protein 6.0 - 8.3 g/dL 7.5 7.5 7.4  Albumin 3.5 - 5.2 g/dL 4.2 4.6 4.6  AST 0 - 37 U/L _0 ALT 0 - 35 U/L _1 Alk Phosphatase 39 - 117 U/L 52 54 63  Total Bilirubin 0.2 - 1.2 mg/dL 0.4 0.4 0.5    Lab Results  Component Value Date/Time   TSH 2.85 10/07/2020 12:20 PM   TSH 6.31 (H) 07/08/2020 02:46 PM   FREET4 0.85 10/07/2020 12:20 PM   FREET4 0.84 07/08/2020 02:46 PM    CBC Latest Ref Rng & Units 03/28/2018 01/31/2018 11/17/2016  WBC 4.0 - 10.5 K/uL 10.0 5.8 16.9(H)  Hemoglobin 12.0 - 15.0 g/dL 13.2 13.2 13.4  Hematocrit 36.0 - 46.0 % 40.6 39.7 39.4  Platelets 150 - 400 K/uL 380 353.0 423(H)    Lab Results  Component Value Date/Time   VD25OH 43.32 10/07/2020 12:20 PM   VD25OH 40.99 09/28/2019 08:54 AM    Clinical ASCVD: Yes  The 10-year ASCVD risk score Mikey Bussing DC Jr., et al., 2013) is: 45.4%   Values  used to calculate the score:     Age: 55 years     Sex: Female     Is Non-Hispanic African American: No     Diabetic: Yes     Tobacco smoker: No     Systolic Blood Pressure: 414 mmHg     Is BP treated: Yes     HDL Cholesterol: 41.5 mg/dL     Total Cholesterol: 211 mg/dL    Depression screen [facility] 2/9 01/15/2021 10/07/2020 08/15/2020  Decreased Interest 0 2 3  Down, Depressed, Hopeless 0 2 0  PHQ - 2 Score 0 4 3  Altered sleeping 0 0 3  Tired, decreased energy 0 0 3  Change in appetite 0 0 0  Feeling bad or failure about yourself  0 0 0  Trouble concentrating 0 0 0  Moving slowly or fidgety/restless 0 0 0  Suicidal thoughts 0 0 0  PHQ-9 Score 0 4 9  Difficult doing work/chores Not difficult at  all Not difficult at all -    Social History   Tobacco Use  Smoking Status Never Smoker  Smokeless Tobacco Never Used   BP Readings from Last 3 Encounters:  05/05/21 140/66  01/15/21 130/64  12/08/20 (!) 150/74   Pulse Readings from Last 3 Encounters:  05/05/21 97  01/15/21 80  12/08/20 81   Wt Readings from Last 3 Encounters:  05/05/21 149 lb 8 oz (67.8 kg)  01/15/21 150 lb 3 oz (68.1 kg)  12/08/20 154 lb (69.9 kg)   BMI Readings from Last 3 Encounters:  05/05/21 26.48 kg/m  01/15/21 26.60 kg/m  12/08/20 27.28 kg/m    Assessment/Interventions: Review of patient past medical history, allergies, medications, health status, including review of consultants reports, laboratory and other test data, was performed as part of comprehensive evaluation and provision of chronic care management services.   SDOH:  (Social Determinants of Health) assessments and interventions performed: Yes SDOH Interventions   Flowsheet Row Most Recent Value  SDOH Interventions   Financial Strain Interventions Intervention Not Indicated     SDOH Screenings   Alcohol Screen: Low Risk   . Last Alcohol Screening Score (AUDIT): 1  Depression (PHQ2-9): Low Risk   . PHQ-2 Score: 0  Financial Resource Strain: Low Risk   . Difficulty of Paying Living Expenses: Not very hard  Food Insecurity: No Food Insecurity  . Worried About Charity fundraiser in the Last Year: Never true  . Ran Out of Food in the Last Year: Never true  Housing: Low Risk   . Last Housing Risk Score: 0  Physical Activity: Inactive  . Days of Exercise per Week: 0 days  . Minutes of Exercise per Session: 0 min  Social Connections: Not on file  Stress: Stress Concern Present  . Feeling of Stress : To some extent  Tobacco Use: Low Risk   . Smoking Tobacco Use: Never Smoker  . Smokeless Tobacco Use: Never Used  Transportation Needs: No Transportation Needs  . Lack of Transportation (Medical): No  . Lack of Transportation  (Non-Medical): No    CCM Care Plan  Allergies  Allergen Reactions  . Other Rash    Metal   . Celexa [Citalopram] Other (See Comments)    Throat swelling and difficulty breathing  . Codeine Nausea And Vomiting  . Metformin Nausea And Vomiting  . Robaxin [Methocarbamol] Nausea Only  . Simvastatin Swelling and Rash    Medications Reviewed Today    Reviewed by Debbora Dus, [facility] (Pharmacist)  on 05/11/21 at [place] List Status: <None>  Medication Order Taking? Sig Documenting Provider Last Dose Status Informant  ACCU-CHEK COMPACT PLUS test strip [id number] Yes USE AS DIRECTED TO TEST BLOOD SUGAR DAILY Bedsole, Amy E, MD Taking Active   acetaminophen (TYLENOL) 650 MG CR tablet [id number] Yes One tab as needed every 8 hours, 30 days [provider] Taking Active   aspirin EC 81 MG tablet [id number] Yes Take 81 mg by mouth daily. Every other day [provider] Taking Active Self  Blood Glucose Monitoring Suppl (ACCU-CHEK COMPACT CARE KIT) KIT [id number] Yes Meter Kit Margarita Rana, MD Taking Active Self  clobetasol ointment (TEMOVATE) 0.05 % [id number] Yes Apply 1 application topically daily as needed (psoriasis flares). Please given in jars instead of tubes Diona Browner, Amy E, MD Taking Active   Emollient (CERAVE) CREA [id number] Yes Apply 1 application topically daily as needed (psoriasis). [provider] Taking Active Self           Med Note Vonzella Nipple, SABAH S   Wed Nov 17, 2016  8:12 AM) Mix with triamcinolone  ezetimibe (ZETIA) 10 MG tablet [id number] Yes Take 1 tablet (10 mg total) by mouth daily. Jinny Sanders, MD Taking Active   Flaxseed, Linseed, (FLAX SEED OIL PO) [id number] Yes Take by mouth. [provider] Taking Active Self  fluticasone (FLONASE) 50 MCG/ACT nasal spray [id number] Yes Place 1 spray into both nostrils daily. [provider] Taking Active Self  glipiZIDE (GLUCOTROL XL) 5 MG 24 hr tablet [id number] Yes TAKE 1 TABLET BY MOUTH  EVERY DAY WITH BREAKFAST Bedsole, Amy E, MD Taking Active   ibuprofen (ADVIL,MOTRIN) 200 MG tablet [id number] Yes Take 200 mg by mouth every 6 (six) hours as needed for moderate pain. [provider] Taking Active Self  levothyroxine (SYNTHROID) 75 MCG tablet [id number] Yes TAKE 1 TABLET DAILY Bedsole, Amy E, MD Taking Active   losartan (COZAAR) 50 MG tablet [id number] Yes Take 1 tablet (50 mg total) by mouth daily. Jinny Sanders, MD Taking Active   Multiple Vitamin (MULTIVITAMIN) tablet [id number] Yes Take 1 tablet by mouth every other day.  [provider] Taking Active Self  nitroGLYCERIN (NITROSTAT) 0.4 MG SL tablet [id number] Yes Place 1 tablet (0.4 mg total) under the tongue every 5 (five) minutes as needed. Minna Merritts, MD Taking Active Self  triamcinolone cream (KENALOG) 0.1 % [id number] Yes APPLY TO AFFECTED AREAS ON BODY TWICE DAILY TO AFFECTED AREAS AS NEEDED [provider] Taking Active   Vitamin D, Cholecalciferol, 1000 units TABS [id number] Yes Take 2,000 Units by mouth daily. [provider] Taking Active Self          Patient Active Problem List   Diagnosis Date Noted  . Statin intolerance 01/15/2021  . Myalgia 12/22/2020  . MDD (major depressive disorder), recurrent episode, moderate ([place]) 07/08/2020  . Chronic insomnia 07/08/2020  . Chronic diarrhea 03/30/2018  . GAD (generalized anxiety disorder) 01/31/2018  . Osteopenia 12/08/2017  . Vitamin D deficiency 03/04/2016  . Allergic rhinitis 03/04/2016  . 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 External hemorrhoid 10/20/2015  . Psoriasis 10/20/2015  . History of bilateral knee replacement 10/20/2015  . Osteoarthritis of knee 11/25/2014  . Hypertension associated with diabetes ([place]) 11/06/2010  . CAD, NATIVE VESSEL 11/06/2010  . Hyperlipidemia associated with type 2 diabetes mellitus ([place]) 01/25/2006  . Adult hypothyroidism 11/15/2005     Immunization History  Administered Date(s) Administered  .  H1N1 11/02/2008  . Influenza Split 10/22/2006, 10/28/2007, 09/26/2008, 09/13/2009, 09/12/2010, 09/18/2011, 09/27/2012  . Influenza, High Dose Seasonal PF 09/11/2016, 08/28/2019, 09/22/2020  . Influenza,inj,Quad PF,6+ Mos 09/13/2013, 09/13/2014, 09/20/2017, 09/25/2018  . Influenza-Unspecified 08/27/2014, 09/18/2015  . PFIZER(Purple Top)SARS-COV-2 Vaccination 01/03/2020, 01/26/2020, 11/05/2020  . Pneumococcal Conjugate-13 04/05/2014  . Pneumococcal Polysaccharide-23 09/26/2008, 02/08/2017  . Tdap 09/27/2012    Conditions to be addressed/monitored:  Hypertension, Hyperlipidemia and Diabetes  Care Plan : [place]  Updates made by Debbora Dus, [place] since 05/11/2021 12:00 AM    Problem: CHL AMB "PATIENT-SPECIFIC PROBLEM"     Long-Range Goal: Disease Management   Start Date: 05/11/2021  Priority: High  Note:    Current Barriers:  . Suboptimal control of diabetes and cholesterol  Pharmacist Clinical Goal(s):  Marland Kitchen Patient will achieve ability to self administer medications as prescribed through use of pillbox as evidenced by patient report through collaboration with PharmD and provider.   Interventions: . 1:1 collaboration with Jinny Sanders, MD regarding development and update of comprehensive plan of care as evidenced by provider attestation and co-signature . Inter-disciplinary care team collaboration (see longitudinal plan of care) . Comprehensive medication review performed; medication list updated in electronic medical record  Hypertension (BP goal <140/90) -Controlled - clinic readings within goal  -Current treatment: . Losartan 50 mg - 1 tablet daily  -Medications previously tried: none  -Current home readings: daughter Loss adjuster, chartered) comes to check BP occasionally -Denies hypotensive/hypertensive symptoms -Educated on BP goals and benefits of medications for prevention of heart attack, stroke and  kidney damage; -Counseled to monitor BP at home monthly, document, and provide log at future appointments -Recommended to continue current medication  Hyperlipidemia: (LDL goal < 70) -Uncontrolled - LDL 140 -Current treatment: . Ezetimibe 10 mg - 1 tablet daily (started 05/05/21)  . Aspirin 81 mg - 1 tablet every other day (patient preference due to bruising) . Flaxseed Oil (Omega 3-6-9) - 1 tablet daily -Medications previously tried: Gemfibrozil - myopathy, statin intolerance -Educated on Cholesterol goals;  Importance of limiting foods high in cholesterol; -Recommended to continue current medication  Diabetes (A1c goal <7%) -Not ideally controlled - A1c 7.3% Pt saw PCP 05/05/21. She reports she resumed glipizide after visit. Prior was off glipizide due to feeling bad with BG 80-100 range. She was instructed to resume glipizide if fasting BG > 120. Pt reports unable to check BG daily due to psoriatic arthritis but states her BG is always > 120 in morning so she plans to take the glipizide daily. -Current medications:  -Glipizide 10 mg - 1 tablet daily before breakfast -Medications previously tried: none reported -Current home glucose readings - none, pt checked BG once this week and reports fasting BG was > 120. -Denies hypoglycemic/hyperglycemic symptoms -Current exercise: minimal due to arthritis pain  -Educated on A1c and blood sugar goals; Prevention and management of hypoglycemic episodes; Carbohydrate counting and/or plate method  -We discussed: Follow up in 1 month to review tolerance of glipizide. Take glipizide with breakfast to prevent low blood glucose.  -Counseled to check feet daily and get yearly eye exams  - eye exam due, pt reports eye exam is schedule for May 20, 2021. -Recommended to continue current medication; Follow up in 1 month to review BG log/tolerance glipizide.   Hypothyroidism (TSH WNL, control symptoms) Controlled, TSH within normal limits -Current  medications: -Levothyroxine 75 mcg - 1 tablet daily before breakfast -Recommended to continue current medication  -Arthritis (Goal: Improve symptoms) Reports primarily pain is  in arms/shoulders, states pain is uncontrolled. Last visit with rheumatology 10/29/2019, 01/30/20 - Dr. Annalee Genta. [facility] Reports her specialist for arthritis left the practice and she has not re-established. Has appt with Dr. Evorn Gong next month. -Current medications:  -Tylenol Arthritis 8 hour 650 mg - 1-2 tablets every 8 hours as needed  -Advil 200 mg - 1 tablet every 4-6 hours as needed  -Voltaren gel 1 % - Uses as needed -Medications previously tried: None -She tries to limit use of OTC meds unless pain is moderate-severe. Reports she has taken ibuprofen once since she had COVID. She takes Tylenol more often. Uses Voltaren gel but cost prohibitive.  -Recommended to continue current medication; Follow up with Dr. Evorn Gong appt next month per patient.  Patient Goals/Self-Care Activities . Patient will:  - check fasting BG once a week and with symptoms of hypoglycemia - shaky, sweaty, hungry, weak  - take glipizide with breakfast rather than one hour before  Follow Up Plan: The care management team will reach out to the patient again over the next 30 days.   Medication Assistance: None required.  Patient affirms current coverage meets needs.      Patient's preferred pharmacy [id number] [place] [id number]: [phone]: [phone] -Purchases Zetia and Clobetasol out of pocket from [employer] (~$20 for 90 DS) -[facility] [place]  -Adherence: No gaps in adherence for statin and glipizide  Medication adherence schedule per patient: Before breakfast: Losartan, glipizide, levothyroxine, Zetia, Aspirin 81 mg  Waits 30-1 hour before eating    Evening Meal: Magnesium for leg cramps, Flaxseed Oil (Omega  3-6-9), Vitamin D3 2000 IU, Mulitvitamin Complete Women's 50+  Recommended: Take levothyroxine by itself first thing in the morning 30-1 hour before breakfast. Then take glipizide, losartan, and Zetia with breakfast. I believe waiting an hour to eat after glipizide may be causing symptoms of hypoglycemia in the past. Take aspirin and vitamins at evening meal.  She has not been taking aspirin lately due to bruising. When she does take it she takes every other day; Discussed importance of taking this at least every other day due to CAD.  Nitroglycerin (has not refilled in several years). Patient denies recent chest pain.  Uses pill box? Yes - fills one week at a time ([facility] Pt endorses 100% compliance  We discussed: Current pharmacy is preferred with insurance plan and patient is satisfied with pharmacy services. [facility] with [facility] Value Script.   Care Plan and Follow Up Patient Decision:  Patient agrees to Care Plan and Follow-up.  MDebbora Dus PharmD Clinical Pharmacist [phone]

## 2021-05-11 NOTE — Patient Instructions (Signed)
Dear Renee [name],  Below is a summary of the goals we discussed during our follow up appointment on May 11, 2021. Please contact me anytime with questions or concerns.   Visit Information  Patient Care Plan: CCM Pharmacy Care Plan    Problem Identified: CHL AMB "PATIENT-SPECIFIC PROBLEM"     Long-Range Goal: Disease Management   Start Date: 05/11/2021  Priority: High  Note:    Current Barriers:  . Suboptimal control of diabetes and cholesterol  Pharmacist Clinical Goal(s):  Marland Kitchen Patient will achieve ability to self administer medications as prescribed through use of pillbox as evidenced by patient report through collaboration with PharmD and provider.   Interventions: . 1:1 collaboration with Jinny Sanders, MD regarding development and update of comprehensive plan of care as evidenced by provider attestation and co-signature . Inter-disciplinary care team collaboration (see longitudinal plan of care) . Comprehensive medication review performed; medication list updated in electronic medical record  Hypertension (BP goal <140/90) -Controlled - clinic readings within goal  -Current treatment: . Losartan 50 mg - 1 tablet daily  -Medications previously tried: none  -Current home readings: daughter Loss adjuster, chartered) comes to check BP occasionally -Denies hypotensive/hypertensive symptoms -Educated on BP goals and benefits of medications for prevention of heart attack, stroke and kidney damage; -Counseled to monitor BP at home monthly, document, and provide log at future appointments -Recommended to continue current medication  Hyperlipidemia: (LDL goal < 70) -Uncontrolled - LDL 140 -Current treatment: . Ezetimibe 10 mg - 1 tablet daily (started 05/05/21)  . Aspirin 81 mg - 1 tablet every other day (patient preference due to bruising) . Flaxseed Oil (Omega 3-6-9) - 1 tablet daily -Medications previously tried: Gemfibrozil - myopathy, statin intolerance -Educated on Cholesterol goals;   Importance of limiting foods high in cholesterol; -Recommended to continue current medication  Diabetes (A1c goal <7%) -Not ideally controlled - A1c 7.3% Pt saw PCP 05/05/21. She reports she resumed glipizide after visit. Prior was off glipizide due to feeling bad with BG 80-100 range. She was instructed to resume glipizide if fasting BG > 120. Pt reports unable to check BG daily due to psoriatic arthritis but states her BG is always > 120 in morning so she plans to take the glipizide daily. -Current medications:  -Glipizide 10 mg - 1 tablet daily before breakfast -Medications previously tried: none reported -Current home glucose readings - none, pt checked BG once this week and reports fasting BG was > 120. -Denies hypoglycemic/hyperglycemic symptoms -Current exercise: minimal due to arthritis pain  -Educated on A1c and blood sugar goals; Prevention and management of hypoglycemic episodes; Carbohydrate counting and/or plate method  -We discussed: Follow up in 1 month to review tolerance of glipizide. Take glipizide with breakfast to prevent low blood glucose.  -Counseled to check feet daily and get yearly eye exams  - eye exam due, pt reports eye exam is schedule for May 20, 2021. -Recommended to continue current medication; Follow up in 1 month to review BG log/tolerance glipizide.   Hypothyroidism (TSH WNL, control symptoms) Controlled, TSH within normal limits -Current medications: -Levothyroxine 75 mcg - 1 tablet daily before breakfast -Recommended to continue current medication  -Arthritis (Goal: Improve symptoms) Reports primarily pain is in arms/shoulders, states pain is uncontrolled. Last visit with rheumatology 10/29/2019, 01/30/20 - Dr. Annalee Genta. [facility] Reports her specialist for arthritis left the practice and she has not re-established. Has appt with Dr. Evorn Gong next month. -Current medications:  -Tylenol Arthritis 8 hour 650 mg - 1-2  tablets every 8 hours as  needed  -Advil 200 mg - 1 tablet every 4-6 hours as needed  -Voltaren gel 1 % - Uses as needed -Medications previously tried: None -She tries to limit use of OTC meds unless pain is moderate-severe. Reports she has taken ibuprofen once since she had COVID. She takes Tylenol more often. Uses Voltaren gel but cost prohibitive.  -Recommended to continue current medication; Follow up with Dr. Evorn Gong appt next month per patient.  Patient Goals/Self-Care Activities . Patient will:  - check fasting BG once a week and with symptoms of hypoglycemia - shaky, sweaty, hungry, weak  - take glipizide with breakfast rather than one hour before  Follow Up Plan: The care management team will reach out to the patient again over the next 30 days.   Medication Assistance: None required.  Patient affirms current coverage meets needs.       The patient verbalized understanding of instructions, educational materials, and care plan provided today and agreed to receive a mailed copy of patient instructions, educational materials, and care plan.  Telephone follow up appointment with pharmacy team member scheduled for: May 18, 2021, 1 PM  Debbora Dus, PharmD Clinical Pharmacist [facility] [phone]   Preventing Hypoglycemia Hypoglycemia occurs when the level of sugar (glucose) in the blood is too low. Hypoglycemia can happen in people who do or do not have diabetes (diabetes mellitus). It can develop quickly, and it can be a medical emergency. For most people with diabetes, a blood glucose level below 70 mg/dL (3.9 mmol/L) is considered hypoglycemia. Glucose is a type of sugar that provides the body's main source of energy. Certain hormones (insulin and glucagon) control the level of glucose in the blood. Insulin lowers blood glucose, and glucagon increases blood glucose. Hypoglycemia can result from having too much insulin in the bloodstream, or from not eating enough food that  contains glucose. Your risk for hypoglycemia is higher:  If you take insulin or diabetes medicines to help lower your blood glucose or help your body make more insulin.  If you skip or delay a meal or snack.  If you are ill.  During and after exercise. You can prevent hypoglycemia by working with your health care provider to adjust your meal plan as needed and by taking other precautions. How can hypoglycemia affect me? Mild symptoms Mild hypoglycemia may not cause any symptoms. If you do have symptoms, they may include:  Hunger.  Anxiety.  Sweating and feeling clammy.  Dizziness or feeling light-headed.  Sleepiness.  Nausea.  Increased heart rate.  Headache.  Blurry vision.  Irritability.  Tingling or numbness around the mouth, lips, or tongue.  A change in coordination.  Restless sleep. If mild hypoglycemia is not recognized and treated, it can quickly become moderate or severe hypoglycemia. Moderate symptoms Moderate hypoglycemia can cause:  Mental confusion and poor judgment.  Behavior changes.  Weakness.  Irregular heartbeat. Severe symptoms Severe hypoglycemia is a medical emergency. It can cause:  Fainting.  Seizures.  Loss of consciousness (coma).  Death. What nutrition changes can be made?  Work with your health care provider or diet and nutrition specialist (dietitian) to make a healthy meal plan that is right for you. Follow your meal plan carefully.  Eat meals at regular times.  If recommended by your health care provider, have snacks between meals.  Donot skip or delay meals or snacks. You can be at risk for hypoglycemia if you are not getting  enough carbohydrates. What lifestyle changes can be made?  Work closely with your health care provider to manage your blood glucose. Make sure you know: ? Your goal blood glucose levels. ? How and when to check your blood glucose. ? The symptoms of hypoglycemia. It is important to treat it  right away to keep it from becoming severe.  Do not drink alcohol on an empty stomach.  When you are ill, check your blood glucose more often than usual. Follow your sick day plan whenever you cannot eat or drink normally. Make this plan in advance with your health care provider.  Always check your blood glucose before, during, and after exercise.   How is this treated? This condition can often be treated by immediately eating or drinking something that contains sugar, such as:  Fruit juice, 4-6 oz (120-150 mL).  Regular (not diet) soda, 4-6 oz (120-150 mL).  Low-fat milk, 4 oz (120 mL).  Several pieces of hard candy.  Sugar or honey, 1 Tbsp (15 mL). Treating hypoglycemia if you have diabetes If you are alert and able to swallow safely, follow the 15:15 rule:  Take 15 grams of a rapid-acting carbohydrate. Talk with your health care provider about how much you should take.  Rapid-acting options include: ? Glucose pills (take 15 grams). ? 6-8 pieces of hard candy. ? 4-6 oz (120-150 mL) of fruit juice. ? 4-6 oz (120-150 mL) of regular (not diet) soda.  Check your blood glucose 15 minutes after you take the carbohydrate.  If the repeat blood glucose level is still at or below 70 mg/dL (3.9 mmol/L), take 15 grams of a carbohydrate again.  If your blood glucose level does not increase above 70 mg/dL (3.9 mmol/L) after 3 tries, seek emergency medical care.  After your blood glucose level returns to normal, eat a meal or a snack within 1 hour. Treating severe hypoglycemia Severe hypoglycemia is when your blood glucose level is at or below 54 mg/dL (3 mmol/L). Severe hypoglycemia is a medical emergency. Get medical help right away. If you have severe hypoglycemia and you cannot eat or drink, you may need an injection of glucagon. A family member or close friend should learn how to check your blood glucose and how to give you a glucagon injection. Ask your health care provider if you  need to have an emergency glucagon injection kit available. Severe hypoglycemia may need to be treated in a hospital. The treatment may include getting glucose through an IV. You may also need treatment for the cause of your hypoglycemia. Where to find more information  American Diabetes Association: www.diabetes.[employer] of Diabetes and Digestive and Kidney Diseases: [employer] Contact a health care provider if:  You have problems keeping your blood glucose in your target range.  You have frequent episodes of hypoglycemia. Get help right away if:  You continue to have hypoglycemia symptoms after eating or drinking something containing glucose.  Your blood glucose level is at or below 54 mg/dL (3 mmol/L).  You faint.  You have a seizure. These symptoms may represent a serious problem that is an emergency. Do not wait to see if the symptoms will go away. Get medical help right away. Call your local emergency services (911 in the [place] Summary  Know the symptoms of hypoglycemia, and when you are at risk for it (such as during exercise or when you are sick). Check your blood glucose often when you are at risk for hypoglycemia.  Hypoglycemia can  develop quickly, and it can be dangerous if it is not treated right away. If you have a history of severe hypoglycemia, make sure you know how to use your glucagon injection kit.  Make sure you know how to treat hypoglycemia. Keep a carbohydrate snack available when you may be at risk for hypoglycemia. This information is not intended to replace advice given to you by your health care provider. Make sure you discuss any questions you have with your health care provider. Document Revised: 04/06/2019 Document Reviewed: 08/10/2017 Elsevier Patient Education  2021 [employer].

## 2021-05-11 NOTE — Progress Notes (Signed)
Encounter details: CCM Time Spent      Value Time User   Time spent with patient (minutes)  45 05/11/2021  9:56 AM Debbora Dus, RPH   Time spent performing Chart review  30 05/11/2021  9:56 AM Debbora Dus, [facility]   Total time (minutes)  75 05/11/2021  9:56 AM Debbora Dus, RPH     Moderate to High Complex Decision Making      Value Time User   Moderate to High complex decision making  Yes 05/11/2021  9:56 AM Debbora Dus, [facility]     CCM Services: This encounter meets complex CCM services and moderate to high decision making.  Prior to outreach and patient consent for Chronic Care Management, I referred this patient for services after reviewing the nominated patient list or from a personal encounter with the patient.  I have personally reviewed this encounter including the documentation in this note and have collaborated with the care management provider regarding care management and care coordination activities to include development and update of the comprehensive care plan. I am certifying that I agree with the content of this note and encounter as supervising physician.

## 2021-05-20 ENCOUNTER — Other Ambulatory Visit: Payer: Self-pay

## 2021-05-20 ENCOUNTER — Encounter: Payer: Self-pay | Admitting: Obstetrics and Gynecology

## 2021-05-20 ENCOUNTER — Ambulatory Visit (INDEPENDENT_AMBULATORY_CARE_PROVIDER_SITE_OTHER): Payer: Medicare Other | Admitting: Obstetrics and Gynecology

## 2021-05-20 VITALS — BP 138/72 | Ht 64.0 in | Wt 149.0 lb

## 2021-05-20 DIAGNOSIS — H43813 Vitreous degeneration, bilateral: Secondary | ICD-10-CM | POA: Diagnosis not present

## 2021-05-20 DIAGNOSIS — R875 Abnormal microbiological findings in specimens from female genital organs: Secondary | ICD-10-CM

## 2021-05-20 DIAGNOSIS — L280 Lichen simplex chronicus: Secondary | ICD-10-CM | POA: Diagnosis not present

## 2021-05-20 DIAGNOSIS — E119 Type 2 diabetes mellitus without complications: Secondary | ICD-10-CM | POA: Diagnosis not present

## 2021-05-20 LAB — HM DIABETES EYE EXAM

## 2021-05-20 MED ORDER — CLOBETASOL PROPIONATE 0.05 % EX OINT: TOPICAL_OINTMENT | CUTANEOUS | 5 refills | Status: DC

## 2021-05-20 NOTE — Progress Notes (Signed)
Patient ID: Renee [name], female   DOB: Aug 06, 1946, 75 y.o.   MRN: [id number]  Reason for Consult: Gynecologic Exam   Referred by Jinny Sanders, MD  Subjective:     HPI:  Renee [name] is a 75 y.o. female. She reports that she has been having an ongoing issue with vulvar itching and redness. She was seen previously by Dalia Heading and prescribed clobetasol which does help her symptoms, but when the medication is discontinued her symptoms return. She has been ussing clobetasol on and off since 2014-2016. She reports she recently has ben using it twice a day.   She also notes leakage of urine. She reports that because of diabetes she frequents the restroom. At night she notes small leakage of urine which irritates her bottom and frequently causes itching.   Gynecological History  No LMP recorded. Patient is postmenopausal. Menarche: 9 Menopause: uncertain  History of fibroids, polyps, or ovarian cysts? : no  History of PCOS? no Hstory of Endometriosis? no History of abnormal pap smears? no Have you had any sexually transmitted infections in the past? no  She is not sexually active.   Past Medical History:  Diagnosis Date  . Allergy   . Anxiety   . Arthritis   . CAD (coronary artery disease)    non-obstructive. cath 11/11  . Diabetes mellitus    borderline diabetic - diet controlled   . History of chicken pox   . History of phobia    clastrophobia  . Hyperlipidemia   . Hypertension   . Hypothyroidism   . Kidney stones    hx of   . Lichen sclerosus   . Psoriasis   . Urinary tract infection    hx of    Family History  Problem Relation Age of Onset  . COPD Mother   . Diabetes Mother   . Hypertension Mother   . Hypertension Father   . Diabetes Sister   . Hypertension Sister   . Diabetes Brother   . Arthritis Brother   . Heart disease Brother   . Arthritis Brother   . Diabetes Brother   . Heart disease Brother   . Stomach cancer Maternal Grandfather    . Stroke Maternal Grandfather   . Breast cancer Daughter 60       Double mastectomy   Past Surgical History:  Procedure Laterality Date  . APPENDECTOMY    . CARDIAC CATHETERIZATION     2011  . CARPAL TUNNEL RELEASE     Left trigger finger  . CATARACT EXTRACTION W/PHACO Left 06/06/2018   Procedure: CATARACT EXTRACTION PHACO AND INTRAOCULAR LENS PLACEMENT (IOC);  Surgeon: Birder Robson, MD;  Location: [facility] ORS;  Service: Ophthalmology;  Laterality: Left;  [place]  00:23 AP% 16.0 CDE 3.69 Fluid pack lot # [id number]  . CATARACT EXTRACTION W/PHACO Right 09/19/2018   Procedure: CATARACT EXTRACTION PHACO AND INTRAOCULAR LENS PLACEMENT (IOC);  Surgeon: Birder Robson, MD;  Location: [facility] ORS;  Service: Ophthalmology;  Laterality: Right;  [place] 00:29.5 AP% 17.0 CDE$ 5.03 Fluid pack lot # [id number]  . COLONOSCOPY WITH PROPOFOL N/A 05/13/2017   Procedure: COLONOSCOPY WITH PROPOFOL;  Surgeon: Manya Silvas, MD;  Location: [facility] ENDOSCOPY;  Service: Endoscopy;  Laterality: N/A;  . ESOPHAGOGASTRODUODENOSCOPY (EGD) WITH PROPOFOL N/A 01/04/2020   Procedure: ESOPHAGOGASTRODUODENOSCOPY (EGD) WITH PROPOFOL;  Surgeon: [name], MD;  Location: [facility] ENDOSCOPY;  Service: Gastroenterology;  Laterality: N/A;  . EYE SURGERY  2004   lasik - right eye   .  JOINT REPLACEMENT    . KNEE SURGERY Right 2015   arthroscopy  . TOTAL KNEE ARTHROPLASTY Bilateral 06/12/2015   Procedure: TOTAL KNEE BILATERAL;  Surgeon: Gaynelle Arabian, MD;  Location: [facility] ORS;  Service: Orthopedics;  Laterality: Bilateral;  and epidural  . TUBAL LIGATION    . Tubes tied     and untied. and tied again    Short Social History:  Social History   Tobacco Use  . Smoking status: Never Smoker  . Smokeless tobacco: Never Used  Substance Use Topics  . Alcohol use: Yes    Alcohol/week: 0.0 standard drinks    Comment: occasionally    Allergies  Allergen Reactions  . Other Rash    Metal   . Celexa [Citalopram] Other (See Comments)     Throat swelling and difficulty breathing  . Codeine Nausea And Vomiting  . Metformin Nausea And Vomiting  . Robaxin [Methocarbamol] Nausea Only  . Simvastatin Swelling and Rash    Current Outpatient Medications  Medication Sig Dispense Refill  . ACCU-CHEK COMPACT PLUS test strip USE AS DIRECTED TO TEST BLOOD SUGAR DAILY 100 each 1  . acetaminophen (TYLENOL) 650 MG CR tablet One tab as needed every 8 hours, 30 days    . aspirin EC 81 MG tablet Take 81 mg by mouth daily. Every other day    . Blood Glucose Monitoring Suppl (ACCU-CHEK COMPACT CARE KIT) KIT Meter Kit 1 each 0  . clobetasol ointment (TEMOVATE) 8.09 % Apply 1 application topically daily as needed (psoriasis flares). Please given in jars instead of tubes 45 g 1  . clobetasol ointment (TEMOVATE) 0.05 % Apply to affected area every night for 4 weeks, then every other day for 4 weeks and then twice a week for 4 weeks or until resolution. 60 g 5  . Emollient (CERAVE) CREA Apply 1 application topically daily as needed (psoriasis).    . ezetimibe (ZETIA) 10 MG tablet Take 1 tablet (10 mg total) by mouth daily. 90 tablet 3  . Flaxseed, Linseed, (FLAX SEED OIL PO) Take by mouth.    . fluticasone (FLONASE) 50 MCG/ACT nasal spray Place 1 spray into both nostrils daily.    Marland Kitchen glipiZIDE (GLUCOTROL XL) 5 MG 24 hr tablet TAKE 1 TABLET BY MOUTH EVERY DAY WITH BREAKFAST 90 tablet 1  . ibuprofen (ADVIL,MOTRIN) 200 MG tablet Take 200 mg by mouth every 6 (six) hours as needed for moderate pain.    Marland Kitchen levothyroxine (SYNTHROID) 75 MCG tablet TAKE 1 TABLET DAILY 90 tablet 3  . losartan (COZAAR) 50 MG tablet Take 1 tablet (50 mg total) by mouth daily. 90 tablet 3  . Multiple Vitamin (MULTIVITAMIN) tablet Take 1 tablet by mouth every other day.     . nitroGLYCERIN (NITROSTAT) 0.4 MG SL tablet Place 1 tablet (0.4 mg total) under the tongue every 5 (five) minutes as needed. 30 tablet 6  . triamcinolone cream (KENALOG) 0.1 % APPLY TO AFFECTED AREAS ON BODY  TWICE DAILY TO AFFECTED AREAS AS NEEDED    . Vitamin D, Cholecalciferol, 1000 units TABS Take 2,000 Units by mouth daily.     No current facility-administered medications for this visit.    Review of Systems  Constitutional: Negative for chills, fatigue, fever and unexpected weight change.  HENT: Negative for trouble swallowing.  Eyes: Negative for loss of vision.  Respiratory: Negative for cough, shortness of breath and wheezing.  Cardiovascular: Negative for chest pain, leg swelling, palpitations and syncope.  GI: Negative for abdominal pain,  blood in stool, diarrhea, nausea and vomiting.  GU: Negative for difficulty urinating, dysuria, frequency and hematuria.  Musculoskeletal: Negative for back pain, leg pain and joint pain.  Skin: Negative for rash.  Neurological: Negative for dizziness, headaches, light-headedness, numbness and seizures.  Psychiatric: Negative for behavioral problem, confusion, depressed mood and sleep disturbance.        Objective:  Objective   Vitals:   05/20/21 1058  BP: 138/72  Weight: 149 lb (67.6 kg)  Height: _0  (1.626 m)   Body mass index is 25.58 kg/m.  Physical Exam Vitals and nursing note reviewed. Exam conducted with a chaperone present.  Constitutional:      Appearance: Normal appearance. She is well-developed.  HENT:     Head: Normocephalic and atraumatic.  Eyes:     Extraocular Movements: Extraocular movements intact.     Pupils: Pupils are equal, round, and reactive to light.  Cardiovascular:     Rate and Rhythm: Normal rate and regular rhythm.  Pulmonary:     Effort: Pulmonary effort is normal. No respiratory distress.     Breath sounds: Normal breath sounds.  Abdominal:     General: Abdomen is flat.     Palpations: Abdomen is soft.  Genitourinary:      Comments: External: Erythematous vulva. No lesions noted.  Speculum examination: Normal appearing cervix. No blood in the vaginal vault. White discharge.   Bimanual  examination: Uterus midline, non-tender, normal in size, shape and contour.  No CMT. No adnexal masses. No adnexal tenderness. Pelvis not fixed.  Breast exam: exam not performed Musculoskeletal:        General: No signs of injury.  Skin:    General: Skin is warm and dry.  Neurological:     Mental Status: She is alert and oriented to person, place, and time.  Psychiatric:        Behavior: Behavior normal.        Thought Content: Thought content normal.        Judgment: Judgment normal.     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 Encouraged the patient not to rub or cleanse the area since this can exacerbate itching. Patient can consider taking an over-the-counter antihistamine before bedtime to help minimize itching at night. Recommend applying clobetasol ointment once a day before going to bed.  We will have patient return in 4 weeks to monitor the bottom.  Discussed that after tapering the amount of steroid which the patient is using she will need to long-term use the ointment once to twice a week to help maintain skin integrity and prevent return flare of lichen simplex chronicus. Will send nuswab to check for infection given white discharge.   More than 20 minutes were spent face to face with the patient in the room, reviewing the medical record, labs and images, and coordinating care for the patient. The plan of management was discussed in detail and counseling was provided.     Adrian Prows MD [facility] 05/20/2021 1:42 PM

## 2021-05-24 LAB — NUSWAB BV AND CANDIDA, NAA
Candida albicans, NAA: NEGATIVE
Candida glabrata, NAA: NEGATIVE

## 2021-05-29 ENCOUNTER — Ambulatory Visit (INDEPENDENT_AMBULATORY_CARE_PROVIDER_SITE_OTHER): Payer: Medicare Other | Admitting: Family Medicine

## 2021-05-29 ENCOUNTER — Other Ambulatory Visit: Payer: Self-pay

## 2021-05-29 ENCOUNTER — Encounter: Payer: Self-pay | Admitting: Family Medicine

## 2021-05-29 VITALS — BP 172/84 | HR 74 | Temp 97.9°F | Ht 64.0 in | Wt 146.0 lb

## 2021-05-29 DIAGNOSIS — M25512 Pain in left shoulder: Secondary | ICD-10-CM | POA: Diagnosis not present

## 2021-05-29 DIAGNOSIS — I25118 Atherosclerotic heart disease of native coronary artery with other forms of angina pectoris: Secondary | ICD-10-CM

## 2021-05-29 DIAGNOSIS — M109 Gout, unspecified: Secondary | ICD-10-CM | POA: Diagnosis not present

## 2021-05-29 DIAGNOSIS — M542 Cervicalgia: Secondary | ICD-10-CM

## 2021-05-29 DIAGNOSIS — M25511 Pain in right shoulder: Secondary | ICD-10-CM

## 2021-05-29 DIAGNOSIS — G8929 Other chronic pain: Secondary | ICD-10-CM | POA: Diagnosis not present

## 2021-05-29 LAB — SEDIMENTATION RATE: Sed Rate: 24 mm/hr (ref 0–30)

## 2021-05-29 MED ORDER — PREDNISONE 20 MG PO TABS: ORAL_TABLET | ORAL | 0 refills | Status: DC

## 2021-05-29 NOTE — Patient Instructions (Addendum)
Please stop at the lab to have labs drawn.  Start prednisone taper.   Stop  Ibuprofen.

## 2021-05-29 NOTE — Progress Notes (Signed)
Patient ID: Renee [name], female    DOB: 10/15/1946, 75 y.o.   MRN: [id number]  This visit was conducted in person.  BP (!) 172/84   Pulse 74   Temp 97.9 F (36.6 C) (Temporal)   Ht _0  (1.626 m)   Wt 146 lb (66.2 kg)   SpO2 95%   BMI 25.06 kg/m    CC:  Chief Complaint  Patient presents with   Shoulder Pain   Neck Pain   Arm Pain   Gout    Right foot     Subjective:   HPI: Renee [name] is a 75 y.o. female presenting on 05/29/2021 for Shoulder Pain, Neck Pain, Arm Pain, and Gout (Right foot/)  She reports pain in upper beck and neck, pain in both shoulders .. radiated to arms. She has had issues in last 2 years with pain in these area... now worse in last 2 months.  Tingling in finger,  She occ drops things from hands.  Pain fluctuates .  Pain increase with lifting arms. She cannot get comfortable at night, to sleep at night.   using ibuprofen 800 mg 2-3 times daily... helps some.  Has been  Using voltaren gel.. does help much   She has history of gout... she has noted in last week.. left foot redness, heat and pain.  She has psoriasis She saw Dr Luevenia Maxin  For polyarthralgia. 10/2019 [facility]  Did not feel she had psoriatic arthritis. Hx of  Bilateral knee replacement.  Xray of cervical spine 09/2019 Mild loss of disc height at C5-C6 with moderate loss of disc height at C6-C7. There are endplate osteophytes at these levels. Remaining cervical discs are well preserved in height.   Facet spurring leads to moderate neural foraminal narrowing on the left at C4-C5 and C5-C6. There is mild left neural foraminal narrowing at C6-C7 from facet and uncovertebral spurring. Predominantly uncovertebral spurring leads to mild neural foraminal narrowing on the right at C5-C6 and C6-C7.             Relevant past medical, surgical, family and social history reviewed and updated as indicated. Interim medical history since our last visit  reviewed. Allergies and medications reviewed and updated. Outpatient Medications Prior to Visit  Medication Sig Dispense Refill   ACCU-CHEK COMPACT PLUS test strip USE AS DIRECTED TO TEST BLOOD SUGAR DAILY 100 each 1   acetaminophen (TYLENOL) 650 MG CR tablet One tab as needed every 8 hours, 30 days     aspirin EC 81 MG tablet Take 81 mg by mouth daily. Every other day     Blood Glucose Monitoring Suppl (ACCU-CHEK COMPACT CARE KIT) KIT Meter Kit 1 each 0   clobetasol ointment (TEMOVATE) 5.02 % Apply 1 application topically daily as needed (psoriasis flares). Please given in jars instead of tubes 45 g 1   clobetasol ointment (TEMOVATE) 0.05 % Apply to affected area every night for 4 weeks, then every other day for 4 weeks and then twice a week for 4 weeks or until resolution. 60 g 5   Emollient (CERAVE) CREA Apply 1 application topically daily as needed (psoriasis).     ezetimibe (ZETIA) 10 MG tablet Take 1 tablet (10 mg total) by mouth daily. 90 tablet 3   Flaxseed, Linseed, (FLAX SEED OIL PO) Take by mouth.     fluticasone (FLONASE) 50 MCG/ACT nasal spray Place 1 spray into both nostrils daily.     glipiZIDE (GLUCOTROL XL) 5 MG 24  hr tablet TAKE 1 TABLET BY MOUTH EVERY DAY WITH BREAKFAST 90 tablet 1   ibuprofen (ADVIL,MOTRIN) 200 MG tablet Take 200 mg by mouth every 6 (six) hours as needed for moderate pain.     levothyroxine (SYNTHROID) 75 MCG tablet TAKE 1 TABLET DAILY 90 tablet 3   losartan (COZAAR) 50 MG tablet Take 1 tablet (50 mg total) by mouth daily. 90 tablet 3   Multiple Vitamin (MULTIVITAMIN) tablet Take 1 tablet by mouth every other day.      nitroGLYCERIN (NITROSTAT) 0.4 MG SL tablet Place 1 tablet (0.4 mg total) under the tongue every 5 (five) minutes as needed. 30 tablet 6   triamcinolone cream (KENALOG) 0.1 % APPLY TO AFFECTED AREAS ON BODY TWICE DAILY TO AFFECTED AREAS AS NEEDED     Vitamin D, Cholecalciferol, 1000 units TABS Take 2,000 Units by mouth daily.     No  facility-administered medications prior to visit.     Per HPI unless specifically indicated in ROS section below Review of Systems  Constitutional:  Negative for fatigue and fever.  HENT:  Negative for congestion.   Eyes:  Negative for pain.  Respiratory:  Negative for cough and shortness of breath.   Cardiovascular:  Negative for chest pain, palpitations and leg swelling.  Gastrointestinal:  Negative for abdominal pain.  Genitourinary:  Negative for dysuria and vaginal bleeding.  Musculoskeletal:  Negative for back pain.  Neurological:  Negative for syncope, light-headedness and headaches.  Psychiatric/Behavioral:  Negative for dysphoric mood.   Objective:  BP (!) 172/84   Pulse 74   Temp 97.9 F (36.6 C) (Temporal)   Ht _0  (1.626 m)   Wt 146 lb (66.2 kg)   SpO2 95%   BMI 25.06 kg/m   Wt Readings from Last 3 Encounters:  05/29/21 146 lb (66.2 kg)  05/20/21 149 lb (67.6 kg)  05/05/21 149 lb 8 oz (67.8 kg)      Physical Exam Constitutional:      General: She is not in acute distress.    Appearance: Normal appearance. She is well-developed. She is not ill-appearing or toxic-appearing.  HENT:     Head: Normocephalic.     Right Ear: Hearing, tympanic membrane, ear canal and external ear normal. Tympanic membrane is not erythematous, retracted or bulging.     Left Ear: Hearing, tympanic membrane, ear canal and external ear normal. Tympanic membrane is not erythematous, retracted or bulging.     Nose: No mucosal edema or rhinorrhea.     Right Sinus: No maxillary sinus tenderness or frontal sinus tenderness.     Left Sinus: No maxillary sinus tenderness or frontal sinus tenderness.     Mouth/Throat:     Pharynx: Uvula midline.  Eyes:     General: Lids are normal. Lids are everted, no foreign bodies appreciated.     Conjunctiva/sclera: Conjunctivae normal.     Pupils: Pupils are equal, round, and reactive to light.  Neck:     Thyroid: No thyroid mass or thyromegaly.      Vascular: No carotid bruit.     Trachea: Trachea normal.  Cardiovascular:     Rate and Rhythm: Normal rate and regular rhythm.     Pulses: Normal pulses.     Heart sounds: Normal heart sounds, S1 normal and S2 normal. No murmur heard.   No friction rub. No gallop.  Pulmonary:     Effort: Pulmonary effort is normal. No tachypnea or respiratory distress.     Breath sounds: Normal  breath sounds. No decreased breath sounds, wheezing, rhonchi or rales.  Abdominal:     General: Bowel sounds are normal.     Palpations: Abdomen is soft.     Tenderness: There is no abdominal tenderness.  Musculoskeletal:     Right shoulder: Tenderness and bony tenderness present. Decreased range of motion.     Left shoulder: Tenderness and bony tenderness present. Decreased range of motion.     Right upper arm: Tenderness present.     Left upper arm: Tenderness present.     Cervical back: Normal range of motion and neck supple. Pain with movement and muscular tenderness present. No spinous process tenderness.     Comments: Left foot mild erythema and warmth  over dorsum  Skin:    General: Skin is warm and dry.     Findings: No rash.  Neurological:     Mental Status: She is alert.     Cranial Nerves: Cranial nerves are intact.     Sensory: Sensation is intact.     Motor: Motor function is intact.  Psychiatric:        Mood and Affect: Mood is not anxious or depressed.        Speech: Speech normal.        Behavior: Behavior normal. Behavior is cooperative.        Thought Content: Thought content normal.        Judgment: Judgment normal.      Results for orders placed or performed in visit on 05/21/21  HM DIABETES EYE EXAM  Result Value Ref Range   HM Diabetic Eye Exam No Retinopathy No Retinopathy    This visit occurred during the SARS-CoV-2 public health emergency.  Safety protocols were in place, including screening questions prior to the visit, additional usage of staff PPE, and extensive cleaning  of exam room while observing appropriate contact time as indicated for disinfecting solutions.   COVID 19 screen:  No recent travel or known exposure to COVID19 The patient denies respiratory symptoms of COVID 19 at this time. The importance of social distancing was discussed today.   Assessment and Plan    Problem List Items Addressed This Visit     Acute gout involving toe of right foot - Primary    Low uric acid diet.. check uric acid level today. Treat likely acute flare with prednisone taper.      Chronic neck pain    Stop NSAID and treat with prednisone course.      Chronic pain of both shoulders    Check sed rate to eval for PMR given shoulder pain and stiffness in elderly female. No temple pain.      Relevant Orders   Uric acid (Completed)   Sedimentation rate (Completed)   Meds ordered this encounter  Medications   DISCONTD: predniSONE (DELTASONE) 20 MG tablet    Sig: 3 tabs by mouth daily x 3 days, then 2 tabs by mouth daily x 2 days then 1 tab by mouth daily x 2 days    Dispense:  15 tablet    Refill:  0      Eliezer Lofts, MD

## 2021-06-01 DIAGNOSIS — Z85828 Personal history of other malignant neoplasm of skin: Secondary | ICD-10-CM | POA: Diagnosis not present

## 2021-06-01 DIAGNOSIS — D2262 Melanocytic nevi of left upper limb, including shoulder: Secondary | ICD-10-CM | POA: Diagnosis not present

## 2021-06-01 DIAGNOSIS — D2261 Melanocytic nevi of right upper limb, including shoulder: Secondary | ICD-10-CM | POA: Diagnosis not present

## 2021-06-01 DIAGNOSIS — L400 Psoriasis vulgaris: Secondary | ICD-10-CM | POA: Diagnosis not present

## 2021-06-01 DIAGNOSIS — L821 Other seborrheic keratosis: Secondary | ICD-10-CM | POA: Diagnosis not present

## 2021-06-01 DIAGNOSIS — L570 Actinic keratosis: Secondary | ICD-10-CM | POA: Diagnosis not present

## 2021-06-01 LAB — URIC ACID: Uric Acid, Serum: 6.4 mg/dL (ref 2.4–7.0)

## 2021-06-02 ENCOUNTER — Other Ambulatory Visit: Payer: Self-pay | Admitting: Family Medicine

## 2021-06-02 DIAGNOSIS — M25511 Pain in right shoulder: Secondary | ICD-10-CM

## 2021-06-02 DIAGNOSIS — L409 Psoriasis, unspecified: Secondary | ICD-10-CM

## 2021-06-02 DIAGNOSIS — M791 Myalgia, unspecified site: Secondary | ICD-10-CM

## 2021-06-02 DIAGNOSIS — G8929 Other chronic pain: Secondary | ICD-10-CM

## 2021-06-02 DIAGNOSIS — M109 Gout, unspecified: Secondary | ICD-10-CM

## 2021-06-02 DIAGNOSIS — M79671 Pain in right foot: Secondary | ICD-10-CM

## 2021-06-04 ENCOUNTER — Other Ambulatory Visit: Payer: Self-pay

## 2021-06-04 ENCOUNTER — Ambulatory Visit (INDEPENDENT_AMBULATORY_CARE_PROVIDER_SITE_OTHER): Payer: Medicare Other

## 2021-06-04 ENCOUNTER — Ambulatory Visit (INDEPENDENT_AMBULATORY_CARE_PROVIDER_SITE_OTHER): Payer: Medicare Other | Admitting: Podiatry

## 2021-06-04 DIAGNOSIS — M21611 Bunion of right foot: Secondary | ICD-10-CM | POA: Diagnosis not present

## 2021-06-04 DIAGNOSIS — M21612 Bunion of left foot: Secondary | ICD-10-CM | POA: Diagnosis not present

## 2021-06-04 DIAGNOSIS — Q828 Other specified congenital malformations of skin: Secondary | ICD-10-CM

## 2021-06-10 ENCOUNTER — Encounter: Payer: Self-pay | Admitting: Podiatry

## 2021-06-10 NOTE — Progress Notes (Signed)
Subjective:  Patient ID: Renee [name], female    DOB: September 28, 1946,  MRN: [id number]  Chief Complaint  Patient presents with   Bunions    Bilateral callus     75 y.o. female presents with the above complaint.  Patient presents with complaint bilateral hyperkeratotic lesion with central nucleated core.  She states that it is very painful to walk on has progressive gotten worse.  This is to the bilateral submetatarsal 5.  She would like to have them debrided down.  She has not seen anyone else prior to seeing me.  She denies any other acute complaints.  She would like to discuss treatment options for it.  She has tried some shoe gear modification and some padding none of which has helped.  Her pain scale is 10 out of 10 especially when ambulating on it.  She constantly is on her foot as well.   Review of Systems: Negative except as noted in the HPI. Denies N/V/F/Ch.  Past Medical History:  Diagnosis Date   Allergy    Anxiety    Arthritis    CAD (coronary artery disease)    non-obstructive. cath 11/11   Diabetes mellitus    borderline diabetic - diet controlled    History of chicken pox    History of phobia    clastrophobia   Hyperlipidemia    Hypertension    Hypothyroidism    Kidney stones    hx of    Lichen sclerosus    Psoriasis    Urinary tract infection    hx of     Current Outpatient Medications:    ACCU-CHEK COMPACT PLUS test strip, USE AS DIRECTED TO TEST BLOOD SUGAR DAILY, Disp: 100 each, Rfl: 1   acetaminophen (TYLENOL) 650 MG CR tablet, One tab as needed every 8 hours, 30 days, Disp: , Rfl:    aspirin EC 81 MG tablet, Take 81 mg by mouth daily. Every other day, Disp: , Rfl:    Blood Glucose Monitoring Suppl (ACCU-CHEK COMPACT CARE KIT) KIT, Meter Kit, Disp: 1 each, Rfl: 0   clobetasol ointment (TEMOVATE) 4.65 %, Apply 1 application topically daily as needed (psoriasis flares). Please given in jars instead of tubes, Disp: 45 g, Rfl: 1   clobetasol ointment (TEMOVATE)  0.05 %, Apply to affected area every night for 4 weeks, then every other day for 4 weeks and then twice a week for 4 weeks or until resolution., Disp: 60 g, Rfl: 5   Emollient (CERAVE) CREA, Apply 1 application topically daily as needed (psoriasis)., Disp: , Rfl:    ezetimibe (ZETIA) 10 MG tablet, Take 1 tablet (10 mg total) by mouth daily., Disp: 90 tablet, Rfl: 3   Flaxseed, Linseed, (FLAX SEED OIL PO), Take by mouth., Disp: , Rfl:    fluticasone (FLONASE) 50 MCG/ACT nasal spray, Place 1 spray into both nostrils daily., Disp: , Rfl:    glipiZIDE (GLUCOTROL XL) 5 MG 24 hr tablet, TAKE 1 TABLET BY MOUTH EVERY DAY WITH BREAKFAST, Disp: 90 tablet, Rfl: 1   ibuprofen (ADVIL,MOTRIN) 200 MG tablet, Take 200 mg by mouth every 6 (six) hours as needed for moderate pain., Disp: , Rfl:    levothyroxine (SYNTHROID) 75 MCG tablet, TAKE 1 TABLET DAILY, Disp: 90 tablet, Rfl: 3   losartan (COZAAR) 50 MG tablet, Take 1 tablet (50 mg total) by mouth daily., Disp: 90 tablet, Rfl: 3   Multiple Vitamin (MULTIVITAMIN) tablet, Take 1 tablet by mouth every other day. , Disp: , Rfl:  nitroGLYCERIN (NITROSTAT) 0.4 MG SL tablet, Place 1 tablet (0.4 mg total) under the tongue every 5 (five) minutes as needed., Disp: 30 tablet, Rfl: 6   predniSONE (DELTASONE) 20 MG tablet, 3 tabs by mouth daily x 3 days, then 2 tabs by mouth daily x 2 days then 1 tab by mouth daily x 2 days, Disp: 15 tablet, Rfl: 0   triamcinolone cream (KENALOG) 0.1 %, APPLY TO AFFECTED AREAS ON BODY TWICE DAILY TO AFFECTED AREAS AS NEEDED, Disp: , Rfl:    Vitamin D, Cholecalciferol, 1000 units TABS, Take 2,000 Units by mouth daily., Disp: , Rfl:   Social History   Tobacco Use  Smoking Status Never  Smokeless Tobacco Never    Allergies  Allergen Reactions   Other Rash    Metal    Celexa [Citalopram] Other (See Comments)    Throat swelling and difficulty breathing   Codeine Nausea And Vomiting   Metformin Nausea And Vomiting   Robaxin  [Methocarbamol] Nausea Only   Simvastatin Swelling and Rash   Objective:  There were no vitals filed for this visit. There is no height or weight on file to calculate BMI. Constitutional Well developed. Well nourished.  Vascular Dorsalis pedis pulses palpable bilaterally. Posterior tibial pulses palpable bilaterally. Capillary refill normal to all digits.  No cyanosis or clubbing noted. Pedal hair growth normal.  Neurologic Normal speech. Oriented to person, place, and time. Epicritic sensation to light touch grossly present bilaterally.  Dermatologic Nails well groomed and normal in appearance. No open wounds. No skin lesions.  Orthopedic: Bilateral submetatarsal 5 porokeratotic/benign skin lesion with central nucleated core noted.  Upon debridement no pinpoint bleeding noted.  No wounds noted.   Radiographs:   3 views of skeletally mature adult bilateral foot: Midfoot arthritis noted.  Plantar and posterior heel spurring noted.  Plantarflexed fifth metatarsal noted with mild signs of tailor's bunion.  There is increase in lateral deviation angle. Assessment:   1. Porokeratosis    Plan:  Patient was evaluated and treated and all questions answered.  Bilateral submetatarsal 5 porokeratosis x2 -X-ray to the patient the etiology of porokeratosis and was treatment options were extensively discussed.  Given the amount of pain she is having I believe she will benefit from debridement of the lesion followed by excision of the lesion.  I discussed this with the patient she states understanding like to proceed with that. -Using chisel blade and handle the lesion was debrided down to healthy striated tissue followed by excision of central nucleated core.  Immediate pain was noted after debridement and excision.  No pinpoint bleeding noted.    No follow-ups on file.

## 2021-06-16 ENCOUNTER — Telehealth: Payer: Self-pay

## 2021-06-16 NOTE — Chronic Care Management (AMB) (Addendum)
Chronic Care Management Pharmacy Assistant   Name: LANELLE LINDO  MRN: [id number] DOB: Jun 28, 1946  Reason for Encounter: Chart Review/CCM Appointment Reminder  Recent office visits:  05/29/21 Orlin Hilding PCP -Start prednisone course, if fasting sugars > 120, restart glipizide daily  05/05/21 - Dr.Bedsole PCP - Refilled topical steroid for rash on hands  Recent consult visits:  06/04/21 -Podiatry - No medication changes 05/20/21 - OB-GYN - Started clobetasol propionate 0.05% ointment  Hospital visits:  None in previous 6 months  Medications: Outpatient Encounter Medications as of 06/16/2021  Medication Sig Note   ACCU-CHEK COMPACT PLUS test strip USE AS DIRECTED TO TEST BLOOD SUGAR DAILY    acetaminophen (TYLENOL) 650 MG CR tablet One tab as needed every 8 hours, 30 days    aspirin EC 81 MG tablet Take 81 mg by mouth daily. Every other day    Blood Glucose Monitoring Suppl (ACCU-CHEK COMPACT CARE KIT) KIT Meter Kit    clobetasol ointment (TEMOVATE) 5.88 % Apply 1 application topically daily as needed (psoriasis flares). Please given in jars instead of tubes    clobetasol ointment (TEMOVATE) 0.05 % Apply to affected area every night for 4 weeks, then every other day for 4 weeks and then twice a week for 4 weeks or until resolution.    Emollient (CERAVE) CREA Apply 1 application topically daily as needed (psoriasis). 11/17/2016: Mix with triamcinolone   ezetimibe (ZETIA) 10 MG tablet Take 1 tablet (10 mg total) by mouth daily.    Flaxseed, Linseed, (FLAX SEED OIL PO) Take by mouth.    fluticasone (FLONASE) 50 MCG/ACT nasal spray Place 1 spray into both nostrils daily.    glipiZIDE (GLUCOTROL XL) 5 MG 24 hr tablet TAKE 1 TABLET BY MOUTH EVERY DAY WITH BREAKFAST    ibuprofen (ADVIL,MOTRIN) 200 MG tablet Take 200 mg by mouth every 6 (six) hours as needed for moderate pain.    levothyroxine (SYNTHROID) 75 MCG tablet TAKE 1 TABLET DAILY    losartan (COZAAR) 50 MG tablet Take 1 tablet (50 mg  total) by mouth daily.    Multiple Vitamin (MULTIVITAMIN) tablet Take 1 tablet by mouth every other day.     nitroGLYCERIN (NITROSTAT) 0.4 MG SL tablet Place 1 tablet (0.4 mg total) under the tongue every 5 (five) minutes as needed.    predniSONE (DELTASONE) 20 MG tablet 3 tabs by mouth daily x 3 days, then 2 tabs by mouth daily x 2 days then 1 tab by mouth daily x 2 days    triamcinolone cream (KENALOG) 0.1 % APPLY TO AFFECTED AREAS ON BODY TWICE DAILY TO AFFECTED AREAS AS NEEDED    Vitamin D, Cholecalciferol, 1000 units TABS Take 2,000 Units by mouth daily.    No facility-administered encounter medications on file as of 06/16/2021.   KATHELINE BRENDLINGER  did not answer the phone  to remind her of her upcoming telephone visit with Debbora Dus on 06/18/2021 at 1:00 PM.  She was reminded to have all medications, supplements and any blood glucose and blood pressure readings available for review at appointment. Message was left for the patient.  Star Rating Drugs: Medication:  Last Fill: Day Supply Glipizide 15m  05/08/21 90 Losartan 510m4/24/22 90  PCP appointment on 08/21/2021  MiDebbora DusCPP notified  VeAvel SensorCCBarahonassistant [phone] have reviewed the care management and care coordination activities outlined in this encounter and I am certifying that I agree with the content of this note.  No further action required.  Debbora Dus, PharmD Clinical Pharmacist [facility] [phone]

## 2021-06-17 ENCOUNTER — Encounter (HOSPITAL_COMMUNITY): Payer: Self-pay | Admitting: Emergency Medicine

## 2021-06-17 ENCOUNTER — Emergency Department (HOSPITAL_COMMUNITY): Payer: Medicare Other

## 2021-06-17 ENCOUNTER — Encounter: Payer: Self-pay | Admitting: Obstetrics and Gynecology

## 2021-06-17 ENCOUNTER — Emergency Department (HOSPITAL_COMMUNITY)
Admission: EM | Admit: 2021-06-17 | Discharge: 2021-06-18 | Disposition: A | Discharge status: Home / Self Care | Payer: Medicare Other | Attending: Emergency Medicine | Admitting: Emergency Medicine

## 2021-06-17 ENCOUNTER — Ambulatory Visit (INDEPENDENT_AMBULATORY_CARE_PROVIDER_SITE_OTHER): Payer: Medicare Other | Admitting: Obstetrics and Gynecology

## 2021-06-17 ENCOUNTER — Other Ambulatory Visit: Payer: Self-pay

## 2021-06-17 VITALS — BP 128/72 | Ht 65.0 in | Wt 142.0 lb

## 2021-06-17 DIAGNOSIS — Z79899 Other long term (current) drug therapy: Secondary | ICD-10-CM | POA: Diagnosis not present

## 2021-06-17 DIAGNOSIS — G459 Transient cerebral ischemic attack, unspecified: Secondary | ICD-10-CM | POA: Insufficient documentation

## 2021-06-17 DIAGNOSIS — R4182 Altered mental status, unspecified: Secondary | ICD-10-CM | POA: Diagnosis not present

## 2021-06-17 DIAGNOSIS — I2510 Atherosclerotic heart disease of native coronary artery without angina pectoris: Secondary | ICD-10-CM | POA: Diagnosis not present

## 2021-06-17 DIAGNOSIS — Z96653 Presence of artificial knee joint, bilateral: Secondary | ICD-10-CM | POA: Insufficient documentation

## 2021-06-17 DIAGNOSIS — L280 Lichen simplex chronicus: Secondary | ICD-10-CM | POA: Diagnosis not present

## 2021-06-17 DIAGNOSIS — Z7984 Long term (current) use of oral hypoglycemic drugs: Secondary | ICD-10-CM | POA: Diagnosis not present

## 2021-06-17 DIAGNOSIS — I10 Essential (primary) hypertension: Secondary | ICD-10-CM | POA: Diagnosis not present

## 2021-06-17 DIAGNOSIS — J341 Cyst and mucocele of nose and nasal sinus: Secondary | ICD-10-CM | POA: Diagnosis not present

## 2021-06-17 DIAGNOSIS — E039 Hypothyroidism, unspecified: Secondary | ICD-10-CM | POA: Diagnosis not present

## 2021-06-17 DIAGNOSIS — I6782 Cerebral ischemia: Secondary | ICD-10-CM | POA: Diagnosis not present

## 2021-06-17 DIAGNOSIS — E1159 Type 2 diabetes mellitus with other circulatory complications: Secondary | ICD-10-CM | POA: Diagnosis not present

## 2021-06-17 DIAGNOSIS — Z7982 Long term (current) use of aspirin: Secondary | ICD-10-CM | POA: Diagnosis not present

## 2021-06-17 DIAGNOSIS — H538 Other visual disturbances: Secondary | ICD-10-CM | POA: Diagnosis present

## 2021-06-17 DIAGNOSIS — R29818 Other symptoms and signs involving the nervous system: Secondary | ICD-10-CM | POA: Diagnosis not present

## 2021-06-17 LAB — CBC
HCT: 45.2 % (ref 36.0–46.0)
Hemoglobin: 14.4 g/dL (ref 12.0–15.0)
MCH: 30.9 pg (ref 26.0–34.0)
MCHC: 31.9 g/dL (ref 30.0–36.0)
MCV: 97 fL (ref 80.0–100.0)
Platelets: 374 10*3/uL (ref 150–400)
RBC: 4.66 MIL/uL (ref 3.87–5.11)
RDW: 13.2 % (ref 11.5–15.5)
WBC: 7.9 10*3/uL (ref 4.0–10.5)
nRBC: 0 % (ref 0.0–0.2)

## 2021-06-17 LAB — I-STAT CHEM 8, ED
BUN: 26 mg/dL — ABNORMAL HIGH (ref 8–23)
Calcium, Ion: 1.28 mmol/L (ref 1.15–1.40)
Chloride: 108 mmol/L (ref 98–111)
Creatinine, Ser: 0.6 mg/dL (ref 0.44–1.00)
Glucose, Bld: 105 mg/dL — ABNORMAL HIGH (ref 70–99)
HCT: 46 % (ref 36.0–46.0)
Hemoglobin: 15.6 g/dL — ABNORMAL HIGH (ref 12.0–15.0)
Potassium: 3.8 mmol/L (ref 3.5–5.1)
Sodium: 144 mmol/L (ref 135–145)
TCO2: 28 mmol/L (ref 22–32)

## 2021-06-17 LAB — COMPREHENSIVE METABOLIC PANEL
ALT: 18 U/L (ref 0–44)
AST: 24 U/L (ref 15–41)
Albumin: 4.2 g/dL (ref 3.5–5.0)
Alkaline Phosphatase: 56 U/L (ref 38–126)
Anion gap: 9 (ref 5–15)
BUN: 21 mg/dL (ref 8–23)
CO2: 25 mmol/L (ref 22–32)
Calcium: 9.6 mg/dL (ref 8.9–10.3)
Chloride: 107 mmol/L (ref 98–111)
Creatinine, Ser: 0.72 mg/dL (ref 0.44–1.00)
GFR, Estimated: >60 mL/min (ref >60)
Glucose, Bld: 104 mg/dL — ABNORMAL HIGH (ref 70–99)
Potassium: 4.1 mmol/L (ref 3.5–5.1)
Sodium: 141 mmol/L (ref 135–145)
Total Bilirubin: 0.6 mg/dL (ref 0.3–1.2)
Total Protein: 7.5 g/dL (ref 6.5–8.1)

## 2021-06-17 LAB — APTT: aPTT: 36 seconds (ref 24–36)

## 2021-06-17 LAB — DIFFERENTIAL
Abs Immature Granulocytes: 0.03 10*3/uL (ref 0.00–0.07)
Basophils Absolute: 0.1 10*3/uL (ref 0.0–0.1)
Basophils Relative: 1 %
Eosinophils Absolute: 0.1 10*3/uL (ref 0.0–0.5)
Eosinophils Relative: 2 %
Immature Granulocytes: 0 %
Lymphocytes Relative: 27 %
Lymphs Abs: 2.1 10*3/uL (ref 0.7–4.0)
Monocytes Absolute: 0.9 10*3/uL (ref 0.1–1.0)
Monocytes Relative: 12 %
Neutro Abs: 4.7 10*3/uL (ref 1.7–7.7)
Neutrophils Relative %: 58 %

## 2021-06-17 LAB — RAPID URINE DRUG SCREEN, HOSP PERFORMED
Amphetamines: NOT DETECTED
Barbiturates: NOT DETECTED
Benzodiazepines: NOT DETECTED
Cocaine: NOT DETECTED
Opiates: NOT DETECTED
Tetrahydrocannabinol: NOT DETECTED

## 2021-06-17 LAB — PROTIME-INR
INR: 0.9 (ref 0.8–1.2)
Prothrombin Time: 12.4 seconds (ref 11.4–15.2)

## 2021-06-17 LAB — CBG MONITORING, ED: Glucose-Capillary: 95 mg/dL (ref 70–99)

## 2021-06-17 LAB — ETHANOL: Alcohol, Ethyl (B): <10 mg/dL (ref <10)

## 2021-06-17 NOTE — Progress Notes (Signed)
Patient ID: Renee [name], female   DOB: [birth year], 75 y.o.   MRN: [id number]  Reason for Consult: Gynecologic Exam   Referred by Jinny Sanders, MD  Subjective:     HPI:  Renee [name] is a 75 y.o. female. She reprots she is having less itching and her bottom is feeling better after steroid use.  She reports new symptoms today of right sided numbness In her arm. Pain and numbness in her right butt cheek and right sided facial numbness. She felt a little off balanced and had some vision changes similar to es in the past. She thought her sugar was low, but she checked it and it was normal. She felt better after eating.   Gynecological History  No LMP recorded. Patient is postmenopausal.    Past Medical History:  Diagnosis Date   Allergy    Anxiety    Arthritis    CAD (coronary artery disease)    non-obstructive. cath 11/11   Diabetes mellitus    borderline diabetic - diet controlled    History of chicken pox    History of phobia    clastrophobia   Hyperlipidemia    Hypertension    Hypothyroidism    Kidney stones    hx of    Lichen sclerosus    Psoriasis    Urinary tract infection    hx of    Family History  Problem Relation Age of Onset   COPD Mother    Diabetes Mother    Hypertension Mother    Hypertension Father    Diabetes Sister    Hypertension Sister    Diabetes Brother    Arthritis Brother    Heart disease Brother    Arthritis Brother    Diabetes Brother    Heart disease Brother    Stomach cancer Maternal Grandfather    Stroke Maternal Grandfather    Breast cancer Daughter 85       Double mastectomy   Past Surgical History:  Procedure Laterality Date   APPENDECTOMY     CARDIAC CATHETERIZATION     2011   CARPAL TUNNEL RELEASE     Left trigger finger   CATARACT EXTRACTION W/PHACO Left 06/06/2018   Procedure: CATARACT EXTRACTION PHACO AND INTRAOCULAR LENS PLACEMENT ([place]);  Surgeon: Birder Robson, MD;  Location: [facility] ORS;  Service:  Ophthalmology;  Laterality: Left;  [place]  00:23 AP% 16.0 CDE 3.69 Fluid pack lot # [id number]   CATARACT EXTRACTION W/PHACO Right 09/19/2018   Procedure: CATARACT EXTRACTION PHACO AND INTRAOCULAR LENS PLACEMENT ([facility]);  Surgeon: Birder Robson, MD;  Location: [facility] ORS;  Service: Ophthalmology;  Laterality: Right;  [place] 00:29.5 AP% 17.0 CDE$ 5.03 Fluid pack lot # [id number]   COLONOSCOPY WITH PROPOFOL N/A 05/13/2017   Procedure: COLONOSCOPY WITH PROPOFOL;  Surgeon: Manya Silvas, MD;  Location: [facility] ENDOSCOPY;  Service: Endoscopy;  Laterality: N/A;   ESOPHAGOGASTRODUODENOSCOPY (EGD) WITH PROPOFOL N/A 01/04/2020   Procedure: ESOPHAGOGASTRODUODENOSCOPY (EGD) WITH PROPOFOL;  Surgeon: [name], MD;  Location: [facility] ENDOSCOPY;  Service: Gastroenterology;  Laterality: N/A;   EYE SURGERY  2004   lasik - right eye    JOINT REPLACEMENT     KNEE SURGERY Right 2015   arthroscopy   TOTAL KNEE ARTHROPLASTY Bilateral 06/12/2015   Procedure: TOTAL KNEE BILATERAL;  Surgeon: Gaynelle Arabian, MD;  Location: [facility] ORS;  Service: Orthopedics;  Laterality: Bilateral;  and epidural   TUBAL LIGATION     Tubes tied     and untied.  and tied again    Short Social History:  Social History   Tobacco Use   Smoking status: Never   Smokeless tobacco: Never  Substance Use Topics   Alcohol use: Yes    Alcohol/week: 0.0 standard drinks    Comment: occasionally    Allergies  Allergen Reactions   Other Rash    Metal    Celexa [Citalopram] Other (See Comments)    Throat swelling and difficulty breathing   Codeine Nausea And Vomiting   Metformin Nausea And Vomiting   Robaxin [Methocarbamol] Nausea Only   Simvastatin Swelling and Rash    Current Outpatient Medications  Medication Sig Dispense Refill   ACCU-CHEK COMPACT PLUS test strip USE AS DIRECTED TO TEST BLOOD SUGAR DAILY 100 each 1   acetaminophen (TYLENOL) 650 MG CR tablet One tab as needed every 8 hours, 30 days     aspirin EC 81 MG tablet Take 81 mg by  mouth daily. Every other day     Blood Glucose Monitoring Suppl (ACCU-CHEK COMPACT CARE KIT) KIT Meter Kit 1 each 0   clobetasol ointment (TEMOVATE) 3.71 % Apply 1 application topically daily as needed (psoriasis flares). Please given in jars instead of tubes 45 g 1   clobetasol ointment (TEMOVATE) 0.05 % Apply to affected area every night for 4 weeks, then every other day for 4 weeks and then twice a week for 4 weeks or until resolution. 60 g 5   Emollient (CERAVE) CREA Apply 1 application topically daily as needed (psoriasis).     ezetimibe (ZETIA) 10 MG tablet Take 1 tablet (10 mg total) by mouth daily. 90 tablet 3   Flaxseed, Linseed, (FLAX SEED OIL PO) Take by mouth.     fluticasone (FLONASE) 50 MCG/ACT nasal spray Place 1 spray into both nostrils daily.     glipiZIDE (GLUCOTROL XL) 5 MG 24 hr tablet TAKE 1 TABLET BY MOUTH EVERY DAY WITH BREAKFAST 90 tablet 1   ibuprofen (ADVIL,MOTRIN) 200 MG tablet Take 200 mg by mouth every 6 (six) hours as needed for moderate pain.     levothyroxine (SYNTHROID) 75 MCG tablet TAKE 1 TABLET DAILY 90 tablet 3   losartan (COZAAR) 50 MG tablet Take 1 tablet (50 mg total) by mouth daily. 90 tablet 3   Multiple Vitamin (MULTIVITAMIN) tablet Take 1 tablet by mouth every other day.      nitroGLYCERIN (NITROSTAT) 0.4 MG SL tablet Place 1 tablet (0.4 mg total) under the tongue every 5 (five) minutes as needed. 30 tablet 6   predniSONE (DELTASONE) 20 MG tablet 3 tabs by mouth daily x 3 days, then 2 tabs by mouth daily x 2 days then 1 tab by mouth daily x 2 days 15 tablet 0   triamcinolone cream (KENALOG) 0.1 % APPLY TO AFFECTED AREAS ON BODY TWICE DAILY TO AFFECTED AREAS AS NEEDED     Vitamin D, Cholecalciferol, 1000 units TABS Take 2,000 Units by mouth daily.     No current facility-administered medications for this visit.    Review of Systems  Constitutional: Negative for chills, fatigue, fever and unexpected weight change.  HENT: Negative for trouble  swallowing.  Eyes: Negative for loss of vision.  Respiratory: Negative for cough, shortness of breath and wheezing.  Cardiovascular: Negative for chest pain, leg swelling, palpitations and syncope.  GI: Negative for abdominal pain, blood in stool, diarrhea, nausea and vomiting.  GU: Negative for difficulty urinating, dysuria, frequency and hematuria.  Musculoskeletal: Negative for back pain, leg pain and  joint pain.  Skin: Negative for rash.  Neurological: Positive for numbness. Negative for dizziness, headaches, light-headedness and seizures.  Psychiatric: Negative for behavioral problem, confusion, depressed mood and sleep disturbance.       Objective:  Objective   Vitals:   06/17/21 1404  BP: 128/72  Weight: 142 lb (64.4 kg)  Height: _0  (1.651 m)   Body mass index is 23.63 kg/m.  Physical Exam Vitals and nursing note reviewed. Exam conducted with a chaperone present.  Constitutional:      Appearance: Normal appearance. She is well-developed.  HENT:     Head: Normocephalic and atraumatic.  Eyes:     Extraocular Movements: Extraocular movements intact.     Pupils: Pupils are equal, round, and reactive to light.  Cardiovascular:     Rate and Rhythm: Normal rate and regular rhythm.  Pulmonary:     Effort: Pulmonary effort is normal. No respiratory distress.     Breath sounds: Normal breath sounds.  Abdominal:     General: Abdomen is flat.     Palpations: Abdomen is soft.  Genitourinary:    Comments:  Improved whiteness and overall appearance of bilateral labia Musculoskeletal:        General: No signs of injury.  Skin:    General: Skin is warm and dry.  Neurological:     Mental Status: She is alert and oriented to person, place, and time.  Psychiatric:        Behavior: Behavior normal.        Thought Content: Thought content normal.        Judgment: Judgment normal.    Assessment/Plan:    75 yo with lichen sclerosis- reports her symptoms have been improving  with nightly application of steroid. She is having much less itching. She is transitioning to every other application tomorrow.  After 4 weeks will then transition to twice a week application for long term skin maitenance.  Will follow up for GYN examination for monitoring in 6 months.   Patient reports symptoms concerning for TIA today. Advised her to go to the ER for evaluation. She is somewhat reluctant to do this, but will discuss with her PCP.   More than 20 minutes were spent face to face with the patient in the room, reviewing the medical record, labs and images, and coordinating care for the patient. The plan of management was discussed in detail and counseling was provided.     Adrian Prows MD [facility], [facility] 06/17/2021 2:38 PM

## 2021-06-17 NOTE — ED Provider Notes (Signed)
11:30 - Patient here with neuro deficits as detailed in MSE note. 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   She has remained hypertensive for duration of visit. H/O stroke. Patient asking about leaving given the wait. MRI ordered for complete evaluation. Patient and family understanding regarding wait times.    Charlann Lange, PA-C 43/01/48 [id number]    Delora Fuel, MD 79/53/69 [phone]

## 2021-06-17 NOTE — ED Provider Notes (Signed)
Emergency Medicine Provider Triage Evaluation Note  Renee [name] , a 75 y.o. female  was evaluated in triage.  Pt states that she woke up this morning and had right-sided buttock paresthesias that quickly resolved.  This was new for her.  She thought perhaps it was her sugars, but her sugars were normal.  Later that morning she developed blurred vision bilaterally and felt as though the right side of her face was numb.  She also had right-sided hand paresthesias while she was driving to her OB/GYN.  She states that she was having difficulty expressing what she wanted to say as if there was a disconnect in her brain.  She states that her symptoms have all resolved with the exception of her mildly blurred vision.  She denies any eye pain.  Review of Systems  Positive: Paresthesias.  Blurred vision.  Negative: Fevers, weakness, gait disturbance.  Physical Exam  BP (!) 192/80 (BP Location: Left Arm)   Pulse 88   Temp 98.4 F (36.9 C) (Oral)   Resp 16   SpO2 98%  Gen:   Awake, no distress   Resp:  Normal effort  MSK:   Moves extremities without difficulty  Other:  CN II to XII grossly intact.  Sensation intact throughout.  Strength symmetric throughout.  Good pulses.  Medical Decision Making  Medically screening exam initiated at 6:26 PM.  Appropriate orders placed.  TAILA BASINSKI was informed that the remainder of the evaluation will be completed by another provider, this initial triage assessment does not replace that evaluation, and the importance of remaining in the ED until their evaluation is complete.  Discussed case with Dr. Eulis Foster.  Will order work-up but not activate code stroke.     Corena Herter, PA-C 06/17/21 Pauline Aus    Daleen Bo, MD 06/17/21 2017

## 2021-06-17 NOTE — ED Triage Notes (Signed)
Pt here from home with c/o right side weakness slurred speech and vision changes , lasted for 1.5 hrs , no symptoms at present

## 2021-06-17 NOTE — Patient Instructions (Signed)
Transient Ischemic Attack A transient ischemic attack (TIA) causes stroke-like symptoms that go away quickly. Having a TIA means that a person is at higher risk for a stroke. A TIA happens when blood supply to the brain is blocked temporarily. A TIA is amedical emergency. What are the causes? This condition is caused by a temporary blockage in an artery in the head or neck. This means the brain does not get the blood supply it needs. There is no permanent brain damage with a TIA. A blockage can be caused by: Fatty buildup in an artery in the head or neck (atherosclerosis). A blood clot. An artery tear (dissection). Inflammation of an artery (vasculitis). Sometimes the cause is not known. What increases the risk? Certain factors may make you more likely to develop this condition. Some of these are things that you can change, such as: Obesity. Using products that contain nicotine or tobacco. Taking oral birth control, especially if you also use tobacco. Not being active. Heavy alcohol use. Drug use, especially cocaine and methamphetamine. Medical conditions that may increase your risk include: High blood pressure (hypertension). High cholesterol. Diabetes. Heart disease (coronary artery disease). An irregular heartbeat, also called atrial fibrillation (AFib). Sickle cell disease. Sleep problems (sleep apnea). Chronic inflammatory diseases, such as rheumatoid arthritis or lupus. Blood clotting disorders (hypercoagulable state). Other risk factors include: Being over the age of 60. Being female. Family history of stroke. Previous history of blood clots, stroke, TIA, or heart attack. Having a history of preeclampsia. Migraine headache. What are the signs or symptoms? Symptoms of a TIA are the same as those of a stroke. The symptoms develop suddenly, and then go away quickly. They may include: Weakness or numbness in your face, arm, or leg, especially on one side of your body. Trouble  walking or moving your arms or legs. Trouble speaking, understanding speech, or both (aphasia). Vision changes, such as double vision, blurred vision, or loss of vision. Dizziness. Confusion. Loss of balance or coordination. Nausea and vomiting. Severe headache. If possible, note what time your symptoms started. Tell your health careprovider. How is this diagnosed? This condition may be diagnosed based on: Your symptoms and medical history. A physical exam. Imaging tests, usually a CT scan or MRI of the brain. Blood tests. You may also have other tests, including: Electrocardiogram (ECG). Echocardiogram. Carotid ultrasound. A scan of blood circulation in the brain (CT angiogram or MR angiogram). Continuous heart monitoring. How is this treated? The goal of treatment is to reduce the risk for a stroke. Stroke prevention therapies may include: Changes to diet and lifestyle, such as being physically active and stopping smoking. Medicines to thin the blood (antiplatelets or anticoagulants). Blood pressure medicines. Medicines to reduce cholesterol. Treating other health conditions, such as diabetes or AFib. If testing shows a narrowing in the arteries to your brain, your health care provider may recommend a procedure, such as: Carotid endarterectomy. This is done to remove the blockage from your artery. Carotid angioplasty and stenting. This uses a tube (stent) to open or widen an artery in the neck. The stent helps keep the artery open by supporting the artery walls. Follow these instructions at home: Medicines Take over-the-counter and prescription medicines only as told by your health care provider. If you were told to take a medicine to thin your blood, such as aspirin or an anticoagulant, take it exactly as told by your health care provider. Taking too much blood-thinning medicine can cause bleeding. Taking too little will not protect   you against a stroke and other  problems. Eating and drinking  Eat 5 or more servings of fruits and vegetables each day. Follow guidelines from your health care provider about your diet. You may need to follow a certain diet to help manage risk factors for stroke. This may include: Eating a low-fat, low-salt diet. Choosing high-fiber foods. Limiting carbohydrates and sugar. If you drink alcohol: Limit how much you have to: 0-1 drink a day for women who are not pregnant. 0-2 drinks a day for men. Know how much alcohol is in a drink. In the [place] one drink equals one 12 oz bottle of beer (355mL), one 5 oz glass of wine (148mL), or one 1 oz glass of hard liquor (44mL).  General instructions Maintain a healthy weight. Try to get at least 30 minutes of exercise on most days. Get treatment if you have sleep apnea. Do not use any products that contain nicotine or tobacco. These products include cigarettes, chewing tobacco, and vaping devices, such as e-cigarettes. If you need help quitting, ask your health care provider. Do not use drugs. Keep all follow-up visits. This is important. Where to find more information American Stroke Association: www.stroke.org Get help right away if: You have chest pain or an irregular heartbeat. You have any symptoms of a stroke. "BE FAST" is an easy way to remember the main warning signs of a stroke. B - Balance. Signs are dizziness, sudden trouble walking, or loss of balance. E - Eyes. Signs are trouble seeing or a sudden change in vision. F - Face. Signs are sudden weakness or numbness of the face, or the face or eyelid drooping on one side. A - Arms. Signs are weakness or numbness in an arm. This happens suddenly and usually on one side of the body. S - Speech. Signs are sudden trouble speaking, slurred speech, or trouble understanding what people say. T - Time. Time to call emergency services. Write down what time symptoms started. You have other signs of a stroke, such as: A sudden,  severe headache with no known cause. Nausea or vomiting. Seizure. These symptoms may represent a serious problem that is an emergency. Do not wait to see if the symptoms will go away. Get medical help right away. Call your local emergency services (911 in the [place] Do not drive yourself to the hospital. Summary A transient ischemic attack (TIA) happens when an artery in the head or neck is blocked. The blockage clears before there is any permanent brain damage. A TIA is a medical emergency. Symptoms of a TIA are the same as those of a stroke. The symptoms develop suddenly, and then go away quickly. Having a TIA means that you are at higher risk for a stroke. The goal of treatment is to reduce your risk for a stroke. Treatment may include medicines to thin the blood and changes to diet and lifestyle. This information is not intended to replace advice given to you by your health care provider. Make sure you discuss any questions you have with your healthcare provider. Document Revised: 07/08/2020 Document Reviewed: 07/08/2020 Elsevier Patient Education  2022 Elsevier Inc.  

## 2021-06-18 ENCOUNTER — Ambulatory Visit (INDEPENDENT_AMBULATORY_CARE_PROVIDER_SITE_OTHER): Payer: Medicare Other

## 2021-06-18 ENCOUNTER — Emergency Department (HOSPITAL_COMMUNITY): Payer: Medicare Other

## 2021-06-18 ENCOUNTER — Telehealth: Payer: Self-pay

## 2021-06-18 DIAGNOSIS — E1159 Type 2 diabetes mellitus with other circulatory complications: Secondary | ICD-10-CM | POA: Diagnosis not present

## 2021-06-18 DIAGNOSIS — E1169 Type 2 diabetes mellitus with other specified complication: Secondary | ICD-10-CM | POA: Diagnosis not present

## 2021-06-18 DIAGNOSIS — I6782 Cerebral ischemia: Secondary | ICD-10-CM | POA: Diagnosis not present

## 2021-06-18 DIAGNOSIS — I152 Hypertension secondary to endocrine disorders: Secondary | ICD-10-CM

## 2021-06-18 DIAGNOSIS — R29818 Other symptoms and signs involving the nervous system: Secondary | ICD-10-CM | POA: Diagnosis not present

## 2021-06-18 DIAGNOSIS — E785 Hyperlipidemia, unspecified: Secondary | ICD-10-CM

## 2021-06-18 DIAGNOSIS — J341 Cyst and mucocele of nose and nasal sinus: Secondary | ICD-10-CM | POA: Diagnosis not present

## 2021-06-18 DIAGNOSIS — G459 Transient cerebral ischemic attack, unspecified: Secondary | ICD-10-CM | POA: Diagnosis not present

## 2021-06-18 LAB — CBG MONITORING, ED: Glucose-Capillary: 99 mg/dL (ref 70–99)

## 2021-06-18 MED ORDER — LORAZEPAM 2 MG/ML IJ SOLN
0.5000 mg | Freq: Once | INTRAMUSCULAR | Status: AC
  Administered 2021-06-18: 0.5 mg via INTRAVENOUS
  Filled 2021-06-18: qty 1

## 2021-06-18 NOTE — ED Notes (Signed)
Pt ambulated down hallway and back independently without need of assistance and without difficulty.

## 2021-06-18 NOTE — Patient Instructions (Signed)
Dear Renee [name],  Below is a summary of the goals we discussed during our follow up appointment on June 18, 2021. Please contact me anytime with questions or concerns.   Visit Information  Patient Care Plan: CCM Pharmacy Care Plan     Problem Identified: CHL AMB "PATIENT-SPECIFIC PROBLEM"      Long-Range Goal: Disease Management   Start Date: 05/11/2021  Priority: High  Note:   Current Barriers:  None identified   Pharmacist Clinical Goal(s):  Patient will achieve ability to self administer medications as prescribed through use of pillbox as evidenced by patient report through collaboration with PharmD and provider.   Interventions: 1:1 collaboration with Jinny Sanders, MD regarding development and update of comprehensive plan of care as evidenced by provider attestation and co-signature Inter-disciplinary care team collaboration (see longitudinal plan of care) Comprehensive medication review performed; medication list updated in electronic medical record  Hypertension (BP goal <140/90) -Not ideally controlled -Current treatment: Losartan 50 mg - 1 tablet daily  -Medications previously tried: none  -Current home readings: using wrist cuff 6/7 - 161/76 6/11 - 154/78 6/12 - 134/70 6/14 -  169/80 6/15 - 145/79 6/20 - 153/77 -Concerned these may not be accurate do to wrist cuff. Yes[facility] at [facility] her BP was 128/72.  -Counseled to monitor BP at home daily use an arm cuff, document, and call if above 140/90 -Recommended to continue current medication for now, see PCP within next 2 weeks  Hyperlipidemia: (LDL goal < 70) -Uncontrolled - LDL 140 (04/27/21 - prior to starting Zetia) -Current treatment: Ezetimibe 10 mg - 1 tablet daily (started 05/05/21)  Aspirin 81 mg - 1 tablet every other day (patient preference due to bruising) Flaxseed Oil (Omega 3-6-9) - 1 tablet daily -Medications previously tried: Gemfibrozil - myopathy, statin intolerance -Recommended to continue current  medication; Will re-eval with next labs. Recommend taking aspirin daily.   Diabetes (A1c goal <7%) -Not ideally controlled per home readings  -A1c 7.3% (pt resumed glipizide after this A1c) -Current medications: Glipizide 10 mg - 1 tablet daily with breakfast -Medications previously tried: none reported -Current home glucose readings -   -Fasting:  last 2 weeks --> 140, 104, 124, 178, 148, 145 -Denies hypoglycemic/hyperglycemia. We discussed the prednisone taper, 05/29/21 likely caused BG to be up the past couple of weeks. Let us know if these don't return to normal. A1c follow up in August with PCP. Pt would like to consider transition to Trulicity at that time if PCP agrees.She still has some on hand from earlier this year, but never started it. I think this would be a good option for CV risk reduction, less risk of hypoglycemia. We discussed it could decrease appetite and cause upset stomach. -Diet - Limits junk food.  -Eye and foot exam are up to date -Recommended to continue current medication; Follow up with PCP in August for DM visit.  Hypothyroidism (TSH WNL, control symptoms) Controlled, TSH within normal limits -Current medications: -Levothyroxine 75 mcg - 1 tablet daily before breakfast -Recommended to continue current medication  Patient Goals/Self-Care Activities Patient will:  - check blood glucose daily for 2 weeks, bring log to PCP -check blood pressure daily for 2 weeks, bring log to PCP  Follow Up Plan: The care management team will reach out to the patient again over the next 90 days.        Patient verbalizes understanding of instructions provided today and agrees to view in [place].    Debbora Dus, PharmD  Clinical Pharmacist [facility] at [facility] [phone]

## 2021-06-18 NOTE — Telephone Encounter (Signed)
Spoke with patient and daughter, tammy, who is a Marine scientist. Patient reports the following home BP from the past month.  She is using a wrist cuff.  Discussed limited accuracy.  Recommended checking with arm cuff this week to ensure BP is < 140/90. They will keep close eye on blood pressure and let us know if it continues to run high with arm cuff.   We also reviewed her BG log. BG has been a little higher (fasting 140s) since steroid taper this month. She continues glipizide once daily. Denies lows. We discussed we may switch to Trulicity pending next H6F. She is still getting Trulicity in mail from [facility], but not taking, never started.  Discussed discharge f/u - schedule office visit with PCP and neurology within 2 weeks. See CCM visit - 06/18/21.  Home BP log: 6/7 - 161/76 6/11 - 154/78 6/12 - 134/70 6/14 -  169/80 6/15 - 145/79 6/20 - 153/77   Debbora Dus, PharmD Clinical Pharmacist [facility] at [facility] [phone]

## 2021-06-18 NOTE — Progress Notes (Signed)
Chronic Care Management Pharmacy Note  06/18/2021 Name:  Renee [name] MRN:  [id number] DOB:  [birth year]  Subjective: Renee [name] is an 75 y.o. year old female who is a primary patient of Bedsole, Amy E, MD.  The CCM team was consulted for assistance with disease management and care coordination needs.    Engaged with patient by telephone for follow up visit in response to provider referral for pharmacy case management and/or care coordination services. We discussed ED visit 06/17/21 - She plans to call and schedule neurology and PCP for follow up. Spoke with patient and her daughter, Renee [name], who is a Marine scientist.   Consent to Services:  The patient was given information about Chronic Care Management services, agreed to services, and gave verbal consent prior to initiation of services.  Please see initial visit note for detailed documentation.   Patient Care Team: Jinny Sanders, MD as PCP - General (Family Medicine) Gaynelle Arabian, MD as Consulting Physician (Orthopedic Surgery) Minna Merritts, MD as Consulting Physician (Cardiology) Dasher, Rayvon Char, MD as Consulting Physician (Dermatology) Birder Robson, MD as Referring Physician (Ophthalmology) Debbora Dus, [facility] as Pharmacist (Pharmacist)  Recent office visits:  05/29/21 Orlin Hilding PCP - labs ordered stop ibuprofen start prednisone 55m taper 05/05/21 - PCP Refilled topical steroid for rash on hands.   Recent consult visits:  06/04/21 - Podiatry  no medication changes 05/20/21 - OB-GYN started clobetasol propionate 0.05% ointment apply topical day If sugars fasting not < 120..Marland Kitchenrestart glipizide daily.   Hospital visits:  ED - 06/17/21   Objective:  Lab Results  Component Value Date   CREATININE 0.60 06/17/2021   BUN 26 (H) 06/17/2021   GFR 82.46 04/27/2021   GFRNONAA >60 06/17/2021   GFRAA >60 03/28/2018   NA 144 06/17/2021   K 3.8 06/17/2021   CALCIUM 9.6 06/17/2021   CO2 25 06/17/2021   GLUCOSE 105 (H) 06/17/2021     Lab Results  Component Value Date/Time   HGBA1C 7.3 (H) 04/27/2021 10:41 AM   HGBA1C 7.0 (A) 01/15/2021 11:21 AM   HGBA1C 7.9 (H) 10/07/2020 12:20 PM   GFR 82.46 04/27/2021 10:41 AM   GFR 84.10 10/07/2020 12:20 PM    Last diabetic Eye exam:  Lab Results  Component Value Date/Time   HMDIABEYEEXA No Retinopathy 05/20/2021 12:00 AM    Last diabetic Foot exam:  Lab Results  Component Value Date/Time   HMDIABFOOTEX done 09/29/2020 12:00 AM     Lab Results  Component Value Date   CHOL 211 (H) 04/27/2021   HDL 41.50 04/27/2021   LDLCALC 140 (H) 04/27/2021   TRIG 146.0 04/27/2021   CHOLHDL 5 04/27/2021    Hepatic Function Latest Ref Rng & Units 06/17/2021 04/27/2021 10/07/2020  Total Protein 6.5 - 8.1 g/dL 7.5 7.5 7.5  Albumin 3.5 - 5.0 g/dL 4.2 4.2 4.6  AST 15 - 41 U/L _0 ALT 0 - 44 U/L _1 Alk Phosphatase 38 - 126 U/L 56 52 54  Total Bilirubin 0.3 - 1.2 mg/dL 0.6 0.4 0.4    Lab Results  Component Value Date/Time   TSH 2.85 10/07/2020 12:20 PM   TSH 6.31 (H) 07/08/2020 02:46 PM   FREET4 0.85 10/07/2020 12:20 PM   FREET4 0.84 07/08/2020 02:46 PM    CBC Latest Ref Rng & Units 06/17/2021 06/17/2021 03/28/2018  WBC 4.0 - 10.5 K/uL - 7.9 10.0  Hemoglobin 12.0 - 15.0 g/dL 15.6(H) 14.4 13.2  Hematocrit 36.0 -  46.0 % 46.0 45.2 40.6  Platelets 150 - 400 K/uL - 374 380    Lab Results  Component Value Date/Time   VD25OH 43.32 10/07/2020 12:20 PM   VD25OH 40.99 09/28/2019 08:54 AM    Clinical ASCVD: Yes  The 10-year ASCVD risk score Mikey Bussing DC Jr., et al., 2013) is: 64.3%   Values used to calculate the score:     Age: 35 years     Sex: Female     Is Non-Hispanic African American: No     Diabetic: Yes     Tobacco smoker: No     Systolic Blood Pressure: 287 mmHg     Is BP treated: Yes     HDL Cholesterol: 41.5 mg/dL     Total Cholesterol: 211 mg/dL    Depression screen [facility] 2/9 05/29/2021 01/15/2021 10/07/2020  Decreased Interest 0 0 2  Down, Depressed,  Hopeless 0 0 2  PHQ - 2 Score 0 0 4  Altered sleeping 0 0 0  Tired, decreased energy 1 0 0  Change in appetite 0 0 0  Feeling bad or failure about yourself  0 0 0  Trouble concentrating 0 0 0  Moving slowly or fidgety/restless 0 0 0  Suicidal thoughts 0 0 0  PHQ-9 Score 1 0 4  Difficult doing work/chores Not difficult at all Not difficult at all Not difficult at all    Social History   Tobacco Use  Smoking Status Never  Smokeless Tobacco Never   BP Readings from Last 3 Encounters:  06/18/21 (!) 182/80  06/17/21 128/72  05/29/21 (!) 172/84   Pulse Readings from Last 3 Encounters:  06/18/21 69  05/29/21 74  05/05/21 97   Wt Readings from Last 3 Encounters:  06/18/21 142 lb (64.4 kg)  06/17/21 142 lb (64.4 kg)  05/29/21 146 lb (66.2 kg)   BMI Readings from Last 3 Encounters:  06/18/21 24.37 kg/m  06/17/21 23.63 kg/m  05/29/21 25.06 kg/m    Assessment/Interventions: Review of patient past medical history, allergies, medications, health status, including review of consultants reports, laboratory and other test data, was performed as part of comprehensive evaluation and provision of chronic care management services.   SDOH:  (Social Determinants of Health) assessments and interventions performed: Yes SDOH Interventions    Flowsheet Row Most Recent Value  SDOH Interventions   Financial Strain Interventions Intervention Not Indicated       SDOH Screenings   Alcohol Screen: Low Risk    Last Alcohol Screening Score (AUDIT): 1  Depression (PHQ2-9): Low Risk    PHQ-2 Score: 1  Financial Resource Strain: Low Risk    Difficulty of Paying Living Expenses: Not very hard  Food Insecurity: No Food Insecurity   Worried About Charity fundraiser in the Last Year: Never true   Ran Out of Food in the Last Year: Never true  Housing: Low Risk    Last Housing Risk Score: 0  Physical Activity: Inactive   Days of Exercise per Week: 0 days   Minutes of Exercise per Session: 0  min  Social Connections: Not on file  Stress: Stress Concern Present   Feeling of Stress : To some extent  Tobacco Use: Low Risk    Smoking Tobacco Use: Never   Smokeless Tobacco Use: Never  Transportation Needs: No Transportation Needs   Lack of Transportation (Medical): No   Lack of Transportation (Non-Medical): No    CCM Care Plan  Allergies  Allergen Reactions   Other Rash  Metal    Celexa [Citalopram] Other (See Comments)    Throat swelling and difficulty breathing   Codeine Nausea And Vomiting   Metformin Nausea And Vomiting   Robaxin [Methocarbamol] Nausea Only   Crestor [Rosuvastatin] Rash   Simvastatin Swelling and Rash    Medications Reviewed Today     Reviewed by Lannette Donath, CPhT (Pharmacy Technician) on 06/18/21 at [place] List Status: Complete   Medication Order Taking? Sig Documenting Provider Last Dose Status Informant  ACCU-CHEK COMPACT PLUS test strip [id number] Yes USE AS DIRECTED TO TEST BLOOD SUGAR DAILY  Patient taking differently: 1 each by Other route See admin instructions. Check blood sugar twice a week   Bedsole, Amy E, MD  Active Multiple Informants  acetaminophen (TYLENOL) 650 MG CR tablet [id number] Yes Take 650 mg by mouth every 8 (eight) hours as needed for pain. [provider] Past Week Active Multiple Informants  aspirin EC 81 MG tablet [id number] Yes Take 81 mg by mouth daily. Every other day [provider] 06/16/2021 Active Multiple Informants  Blood Glucose Monitoring Suppl (ACCU-CHEK COMPACT CARE KIT) KIT [id number] Yes Meter Kit Margarita Rana, MD  Active Multiple Informants  Carboxymethylcellulose Sodium (THERATEARS OP) [id number] Yes Place 1 drop into both eyes in the morning and at bedtime. [provider] 06/17/2021 Active Multiple Informants  clobetasol ointment (TEMOVATE) 0.05 % [id number] Yes Apply 1 application topically daily as needed (psoriasis flares). Please given in jars instead of tubes Bedsole,  Amy E, MD 06/17/2021 Active Multiple Informants  clobetasol ointment (TEMOVATE) 0.05 % [id number] Yes Apply to affected area every night for 4 weeks, then every other day for 4 weeks and then twice a week for 4 weeks or until resolution. Homero Fellers, MD 06/16/2021 Active Multiple Informants           Med Note [place] Crane, MISTY D   Thu Jun 18, 2021  2:26 AM) Pt finished the bedtime x 4 weeks 06/17/21  Emollient (CERAVE) CREA [id number] Yes Apply 1 application topically daily as needed (psoriasis). [provider] Past Month Active Multiple Informants           Med Note Mabeline Caras   Wed Nov 17, 2016  8:12 AM) Mix with triamcinolone  ezetimibe (ZETIA) 10 MG tablet [id number] Yes Take 1 tablet (10 mg total) by mouth daily. Jinny Sanders, MD 06/17/2021 Active Multiple Informants  famotidine (PEPCID) 20 MG tablet [id number] Yes Take 20 mg by mouth daily as needed for heartburn or indigestion. [provider] Past Month Active Multiple Informants  Flaxseed, Linseed, (FLAX SEED OIL PO) [id number] Yes Take 1 capsule by mouth daily. [provider] Past Week Active Multiple Informants  fluticasone (FLONASE) 50 MCG/ACT nasal spray [id number] Yes Place 1 spray into both nostrils daily as needed for allergies. [provider] Past Month Active Multiple Informants  glipiZIDE (GLUCOTROL XL) 5 MG 24 hr tablet [id number] Yes TAKE 1 TABLET BY MOUTH EVERY DAY WITH BREAKFAST  Patient taking differently: Take 5 mg by mouth daily with breakfast.   Diona Browner, Amy E, MD 06/17/2021 Active Multiple Informants  ibuprofen (ADVIL,MOTRIN) 200 MG tablet [id number] Yes Take 600-800 mg by mouth every 6 (six) hours as needed for moderate pain. [provider] Past Week Active Multiple Informants  levothyroxine (SYNTHROID) 75 MCG tablet [id number] Yes TAKE 1 TABLET DAILY  Patient taking differently: Take 75 mcg by mouth daily before breakfast.   Jinny Sanders, MD 06/17/2021 Active Multiple  Informants  losartan (  COZAAR) 50 MG tablet [id number] Yes Take 1 tablet (50 mg total) by mouth daily. Jinny Sanders, MD 06/17/2021 Active Multiple Informants  Multiple Vitamin (MULTIVITAMIN) tablet [id number] Yes Take 1 tablet by mouth every other day.  [provider] Past Week Active Multiple Informants  nitroGLYCERIN (NITROSTAT) 0.4 MG SL tablet [id number] No Place 1 tablet (0.4 mg total) under the tongue every 5 (five) minutes as needed.  Patient not taking: No sig reported   Rockey Situ, Kathlene November, MD Not Taking Active Multiple Informants  predniSONE (DELTASONE) 20 MG tablet [id number] No 3 tabs by mouth daily x 3 days, then 2 tabs by mouth daily x 2 days then 1 tab by mouth daily x 2 days  Patient not taking: No sig reported   Jinny Sanders, MD Completed Course Active Multiple Informants  Skin Protectants, Misc. (EUCERIN) cream [id number] Yes Apply 1 application topically See admin instructions. Eczema relief applies to arms and hands daily [provider] 06/17/2021 Active Multiple Informants  triamcinolone cream (KENALOG) 0.1 % [id number] Yes Apply 1 application topically 2 (two) times daily as needed (psoriasis). [provider] Past Month Active Multiple Informants  Vitamin D, Cholecalciferol, 1000 units TABS [id number] Yes Take 2,000 Units by mouth daily. 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)  ([place]) 10/20/2015   External hemorrhoid 10/20/2015   Psoriasis 10/20/2015   History of  bilateral knee replacement 10/20/2015   Osteoarthritis of knee 11/25/2014   Hypertension associated with diabetes ([place]) 11/06/2010   CAD, NATIVE VESSEL 11/06/2010   Hyperlipidemia associated with type 2 diabetes mellitus ([employer]) 01/25/2006   Adult hypothyroidism 11/15/2005    Immunization History  Administered Date(s) Administered   H1N1 11/02/2008   Influenza Split 10/22/2006, 10/28/2007, 09/26/2008, 09/13/2009, 09/12/2010, 09/18/2011, 09/27/2012   Influenza, High Dose Seasonal PF 09/11/2016, 08/28/2019, 09/22/2020   Influenza,inj,Quad PF,6+ Mos 09/13/2013, 09/13/2014, 09/20/2017, 09/25/2018   Influenza-Unspecified 08/27/2014, 09/18/2015   PFIZER(Purple Top)SARS-COV-2 Vaccination 01/03/2020, 01/26/2020, 11/05/2020   Pneumococcal Conjugate-13 04/05/2014   Pneumococcal Polysaccharide-23 09/26/2008, 02/08/2017   Tdap 09/27/2012    Conditions to be addressed/monitored:  Hypertension, Hyperlipidemia and Diabetes  Care Plan : [place]  Updates made by Debbora Dus, [place] since 06/18/2021 12:00 AM     Problem: CHL AMB "PATIENT-SPECIFIC PROBLEM"      Long-Range Goal: Disease Management   Start Date: 05/11/2021  Priority: High  Note:   Current Barriers:  None identified   Pharmacist Clinical Goal(s):  Patient will achieve ability to self administer medications as prescribed through use of pillbox as evidenced by patient report through collaboration with PharmD and provider.   Interventions: 1:1 collaboration with Jinny Sanders, MD regarding development and update of comprehensive plan of care as evidenced by provider attestation and co-signature Inter-disciplinary care team collaboration (see longitudinal plan of care) Comprehensive medication review performed; medication list updated in electronic medical record  Hypertension (BP goal <140/90) -Not ideally controlled -Current treatment: Losartan 50 mg - 1 tablet daily  -Medications previously tried: none   -Current  home readings: using wrist cuff 6/7 - 161/76 6/11 - 154/78 6/12 - 134/70 6/14 -  169/80 6/15 - 145/79 6/20 - 153/77 -Concerned these may not be accurate do to wrist cuff. [facility] at [facility] her BP was 128/72.  -Counseled to monitor BP at home daily use an arm cuff, document, and call if above 140/90 -Recommended to continue current medication for now, see PCP within next 2 weeks  Hyperlipidemia: (LDL goal < 70) -Uncontrolled - LDL 140 (04/27/21 - prior to starting Zetia) -Current treatment: Ezetimibe 10 mg - 1 tablet daily (started 05/05/21)  Aspirin 81 mg - 1 tablet every other day (patient preference due to bruising) Flaxseed Oil (Omega 3-6-9) - 1 tablet daily -Medications previously tried: Gemfibrozil - myopathy, statin intolerance -Recommended to continue current medication; Will re-eval with next labs. Recommend taking aspirin daily.   Diabetes (A1c goal <7%) -Not ideally controlled per home readings  -A1c 7.3% (pt resumed glipizide after this A1c) -Current medications: Glipizide 10 mg - 1 tablet daily with breakfast -Medications previously tried: none reported -Current home glucose readings -   -Fasting:  last 2 weeks --> 140, 104, 124, 178, 148, 145 -Denies hypoglycemic/hyperglycemia. We discussed the prednisone taper, 05/29/21 likely caused BG to be up the past couple of weeks. Let us know if these don't return to normal. A1c follow up in August with PCP. Pt would like to consider transition to Trulicity at that time if PCP agrees.She still has some on hand from earlier this year, but never started it. I think this would be a good option for CV risk reduction, less risk of hypoglycemia. We discussed it could decrease appetite and cause upset stomach. -Diet - Limits junk food.  -Eye and foot exam are up to date -Recommended to continue current medication; Follow up with PCP in August for DM visit.  Hypothyroidism (TSH WNL, control symptoms) Controlled, TSH within normal  limits -Current medications: -Levothyroxine 75 mcg - 1 tablet daily before breakfast -Recommended to continue current medication  Patient Goals/Self-Care Activities Patient will:  - check blood glucose daily for 2 weeks, bring log to PCP -check blood pressure daily for 2 weeks, bring log to PCP  Follow Up Plan: The care management team will reach out to the patient again over the next 90 days.       Medication Assistance: None required.  Patient affirms current coverage meets needs.  Patient's preferred pharmacy [facility]: [id number] [place] [id number]: [phone]: [phone] Uses pill box? Yes - fills one week at a time ([facility] Pt endorses 100% compliance  We discussed: Current pharmacy is preferred with insurance plan and patient is satisfied with pharmacy services. [facility] with [facility] Value Script.   Care Plan and Follow Up Patient Decision:  Patient agrees to Care Plan and Follow-up.  MDebbora Dus PharmD Clinical Pharmacist [phone]

## 2021-06-18 NOTE — ED Provider Notes (Signed)
[facility] EMERGENCY DEPARTMENT Provider Note   CSN: [id number] Arrival date & time: 06/17/21  1734     History No chief complaint on file.   Renee [name] is a 75 y.o. female.  75 year old female with multi medical problems documented below who presents to the emerged from today with multiple complaints.  Suddenly patient woke up this morning had on and off again blurry vision associated with some blackening of her vision as well.  She did not have visual loss.  It was in both eyes.  She had minimal other symptoms at this time.  On the way to see her gynecologist she started having tightness in the right side of her face.  She also had complete numbness of her whole right hand from the wrist down.  No other associated symptoms at that time.  States that she saw a gynecologist who sent her to the ER for further evaluation.  Since being here her symptoms have resolved.  She never had anything at this before besides arthritis related issues.  She also talks about multiple types of headaches that she has had in the past.  No palpitations, chest pain, back pain, abdominal pain or other associated symptoms.        Past Medical History:  Diagnosis Date   Allergy    Anxiety    Arthritis    CAD (coronary artery disease)    non-obstructive. cath 11/11   Diabetes mellitus    borderline diabetic - diet controlled    History of chicken pox    History of phobia    clastrophobia   Hyperlipidemia    Hypertension    Hypothyroidism    Kidney stones    hx of    Lichen sclerosus    Psoriasis    Urinary tract infection    hx of     Patient Active Problem List   Diagnosis Date Noted   Statin intolerance 01/15/2021   Myalgia 12/22/2020   MDD (major depressive disorder), recurrent episode, moderate (HCC) 07/08/2020   Chronic insomnia 07/08/2020   Chronic diarrhea 03/30/2018   GAD (generalized anxiety disorder) 01/31/2018   Osteopenia 12/08/2017   Vitamin D deficiency  03/04/2016   Allergic rhinitis 03/04/2016   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)  ([place]) 10/20/2015   External hemorrhoid 10/20/2015   Psoriasis 10/20/2015   History of bilateral knee replacement 10/20/2015   Osteoarthritis of knee 11/25/2014   Hypertension associated with diabetes ([place]) 11/06/2010   CAD, NATIVE VESSEL 11/06/2010   Hyperlipidemia associated with type 2 diabetes mellitus ([place]) 01/25/2006   Adult hypothyroidism 11/15/2005    Past Surgical History:  Procedure Laterality Date   APPENDECTOMY     CARDIAC CATHETERIZATION     2011   CARPAL TUNNEL RELEASE     Left trigger finger   CATARACT EXTRACTION W/PHACO Left 06/06/2018   Procedure: CATARACT EXTRACTION PHACO AND INTRAOCULAR LENS PLACEMENT ([place]);  Surgeon: Birder Robson, MD;  Location: [facility] ORS;  Service: Ophthalmology;  Laterality: Left;  [place]  00:23 AP% 16.0 CDE 3.69 Fluid pack lot # [id number]   CATARACT EXTRACTION W/PHACO Right 09/19/2018   Procedure: CATARACT EXTRACTION PHACO AND INTRAOCULAR LENS PLACEMENT (IOC);  Surgeon: Birder Robson, MD;  Location: [facility] ORS;  Service: Ophthalmology;  Laterality: Right;  [place] 00:29.5 AP% 17.0 CDE$ 5.03 Fluid pack lot # [id number]   COLONOSCOPY WITH PROPOFOL N/A 05/13/2017   Procedure: COLONOSCOPY WITH PROPOFOL;  Surgeon: Manya Silvas, MD;  Location: [facility]  ENDOSCOPY;  Service: Endoscopy;  Laterality: N/A;   ESOPHAGOGASTRODUODENOSCOPY (EGD) WITH PROPOFOL N/A 01/04/2020   Procedure: ESOPHAGOGASTRODUODENOSCOPY (EGD) WITH PROPOFOL;  Surgeon: [name], MD;  Location: [facility] ENDOSCOPY;  Service: Gastroenterology;  Laterality: N/A;   EYE SURGERY  2004   lasik - right eye    JOINT REPLACEMENT     KNEE SURGERY Right 2015   arthroscopy   TOTAL KNEE ARTHROPLASTY Bilateral 06/12/2015   Procedure: TOTAL KNEE BILATERAL;  Surgeon: Gaynelle Arabian, MD;  Location: [facility] ORS;  Service: Orthopedics;  Laterality: Bilateral;  and epidural   TUBAL LIGATION     Tubes tied      and untied. and tied again     OB History     Gravida  6   Para  6   Term  6   Preterm      AB      Living  6      SAB      IAB      Ectopic      Multiple      Live Births  10           Family History  Problem Relation Age of Onset   COPD Mother    Diabetes Mother    Hypertension Mother    Hypertension Father    Diabetes Sister    Hypertension Sister    Diabetes Brother    Arthritis Brother    Heart disease Brother    Arthritis Brother    Diabetes Brother    Heart disease Brother    Stomach cancer Maternal Grandfather    Stroke Maternal Grandfather    Breast cancer Daughter 47       Double mastectomy    Social History   Tobacco Use   Smoking status: Never   Smokeless tobacco: Never  Vaping Use   Vaping Use: Never used  Substance Use Topics   Alcohol use: Yes    Alcohol/week: 0.0 standard drinks    Comment: occasionally   Drug use: No    Home Medications Prior to Admission medications   Medication Sig Start Date End Date Taking? Authorizing Provider  ACCU-CHEK COMPACT PLUS test strip USE AS DIRECTED TO TEST BLOOD SUGAR DAILY Patient taking differently: 1 each by Other route See admin instructions. Check blood sugar twice a week 10/27/18  Yes Bedsole, Amy E, MD  acetaminophen (TYLENOL) 650 MG CR tablet Take 650 mg by mouth every 8 (eight) hours as needed for pain. 10/29/19  Yes [provider]  aspirin EC 81 MG tablet Take 81 mg by mouth daily. Every other day   Yes [provider]  Blood Glucose Monitoring Suppl (ACCU-CHEK COMPACT CARE KIT) KIT Meter Kit 01/29/16  Yes Margarita Rana, MD  Carboxymethylcellulose Sodium (THERATEARS OP) Place 1 drop into both eyes in the morning and at bedtime.   Yes [provider]  clobetasol ointment (TEMOVATE) 5.32 % Apply 1 application topically daily as needed (psoriasis flares). Please given in jars instead of tubes 05/05/21  Yes Bedsole, Amy E, MD  clobetasol ointment  (TEMOVATE) 0.05 % Apply to affected area every night for 4 weeks, then every other day for 4 weeks and then twice a week for 4 weeks or until resolution. 05/20/21  Yes Schuman, Christanna R, MD  Emollient (CERAVE) CREA Apply 1 application topically daily as needed (psoriasis).   Yes [provider]  ezetimibe (ZETIA) 10 MG tablet Take 1 tablet (10 mg total) by mouth daily. 05/05/21  Yes Bedsole, Amy E, MD  famotidine (PEPCID) 20 MG tablet Take 20 mg by mouth daily as needed for heartburn or indigestion.   Yes [provider]  Flaxseed, Linseed, (FLAX SEED OIL PO) Take 1 capsule by mouth daily.   Yes [provider]  fluticasone (FLONASE) 50 MCG/ACT nasal spray Place 1 spray into both nostrils daily as needed for allergies.   Yes [provider]  glipiZIDE (GLUCOTROL XL) 5 MG 24 hr tablet TAKE 1 TABLET BY MOUTH EVERY DAY WITH BREAKFAST Patient taking differently: Take 5 mg by mouth daily with breakfast. 05/08/21  Yes Bedsole, Amy E, MD  ibuprofen (ADVIL,MOTRIN) 200 MG tablet Take 600-800 mg by mouth every 6 (six) hours as needed for moderate pain.   Yes [provider]  levothyroxine (SYNTHROID) 75 MCG tablet TAKE 1 TABLET DAILY Patient taking differently: Take 75 mcg by mouth daily before breakfast. 10/14/20  Yes Bedsole, Amy E, MD  losartan (COZAAR) 50 MG tablet Take 1 tablet (50 mg total) by mouth daily. 10/14/20  Yes Bedsole, Amy E, MD  Multiple Vitamin (MULTIVITAMIN) tablet Take 1 tablet by mouth every other day.    Yes [provider]  Skin Protectants, Misc. (EUCERIN) cream Apply 1 application topically See admin instructions. Eczema relief applies to arms and hands daily   Yes [provider]  triamcinolone cream (KENALOG) 0.1 % Apply 1 application topically 2 (two) times daily as needed (psoriasis). 08/28/19  Yes [provider]  Vitamin D, Cholecalciferol, 1000 units TABS Take 2,000 Units by mouth daily.   Yes [provider]  nitroGLYCERIN (NITROSTAT) 0.4 MG SL tablet Place 1 tablet (0.4 mg total) under the tongue every 5 (five) minutes as needed. Patient not taking: No sig reported 09/10/13   Antonieta Iba, MD  predniSONE (DELTASONE) 20 MG tablet 3 tabs by mouth daily x 3 days, then 2 tabs by mouth daily x 2 days then 1 tab by mouth daily x 2 days Patient not taking: No sig reported 05/29/21   Excell Seltzer, MD    Allergies    Other, Celexa [citalopram], Codeine, Metformin, Robaxin [methocarbamol], Crestor [rosuvastatin], and Simvastatin  Review of Systems   Review of Systems  All other systems reviewed and are negative.  Physical Exam Updated Vital Signs BP (!) 182/80   Pulse 69   Temp 97.6 F (36.4 C) (Oral)   Resp 16   Ht 5\' 4"  (1.626 m)   Wt 64.4 kg   SpO2 99%   BMI 24.37 kg/m   Physical Exam Vitals and nursing note reviewed.  Constitutional:      Appearance: She is well-developed.  HENT:     Head: Normocephalic and atraumatic.     Mouth/Throat:     Mouth: Mucous membranes are moist.     Pharynx: Oropharynx is clear.  Eyes:     Pupils: Pupils are equal, round, and reactive to light.  Cardiovascular:     Rate and Rhythm: Normal rate and regular rhythm.  Pulmonary:     Effort: No respiratory distress.     Breath sounds: No stridor.  Abdominal:     General: Abdomen is flat. There is no distension.  Musculoskeletal:        General: No swelling or tenderness. Normal range of motion.     Cervical back: Normal range of motion.  Skin:    General: Skin is warm and dry.  Neurological:     General: No focal deficit present.  Mental Status: She is alert.     Comments: No altered mental status, able to give full seemingly accurate history.  Face is symmetric, EOM's intact, pupils equal and reactive, vision intact, tongue and uvula midline without deviation. Upper and Lower extremity motor 5/5, intact pain perception in distal extremities, 2+ reflexes in biceps, patella  and achilles tendons. Able to perform finger to nose normal with both hands. Walks without assistance or evident ataxia.      ED Results / Procedures / Treatments   Labs (all labs ordered are listed, but only abnormal results are displayed) Labs Reviewed  COMPREHENSIVE METABOLIC PANEL - Abnormal; Notable for the following components:      Result Value   Glucose, Bld 104 (*)    All other components within normal limits  I-STAT CHEM 8, ED - Abnormal; Notable for the following components:   BUN 26 (*)    Glucose, Bld 105 (*)    Hemoglobin 15.6 (*)    All other components within normal limits  ETHANOL  PROTIME-INR  APTT  CBC  DIFFERENTIAL  RAPID URINE DRUG SCREEN, HOSP PERFORMED  URINALYSIS, ROUTINE W REFLEX MICROSCOPIC  CBG MONITORING, ED  CBG MONITORING, ED    EKG EKG Interpretation  Date/Time:  Wednesday June 17 2021 18:12:57 EDT Ventricular Rate:  85 PR Interval:  148 QRS Duration: 70 QT Interval:  346 QTC Calculation: 411 R Axis:   69 Text Interpretation: Normal sinus rhythm Nonspecific ST abnormality Abnormal ECG since last tracing no significant change Confirmed by Daleen Bo [phone]) on 06/17/2021 8:16:50 PM  Radiology CT Head Wo Contrast  Result Date: 06/17/2021 CLINICAL DATA:  Altered mental status EXAM: CT HEAD WITHOUT CONTRAST TECHNIQUE: Contiguous axial images were obtained from the base of the skull through the vertex without intravenous contrast. COMPARISON:  MRI 06/02/2006 FINDINGS: Brain: Normal anatomic configuration. Parenchymal volume loss is commensurate with the patient's age. Mild periventricular white matter changes are present likely reflecting the sequela of small vessel ischemia. Remote lacunar infarct again noted within the right lentiform nucleus. No abnormal intra or extra-axial mass lesion or fluid collection. No abnormal mass effect or midline shift. No evidence of acute intracranial hemorrhage or infarct. Ventricular size is normal. Cerebellum  unremarkable. Vascular: No asymmetric hyperdense vasculature at the skull base. Skull: Intact Sinuses/Orbits: Paranasal sinuses are clear. Orbits are unremarkable. Other: Mastoid air cells and middle ear cavities are clear. 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 No evidence of acute intracranial hemorrhage or infarct. Electronically Signed   By: Fidela Salisbury MD   On: 06/17/2021 19:38   MR BRAIN WO CONTRAST  Result Date: 06/18/2021 CLINICAL DATA:  Initial evaluation for neuro deficit, stroke suspected. EXAM: MRI HEAD WITHOUT CONTRAST TECHNIQUE: Multiplanar, multiecho pulse sequences of the brain and surrounding structures were obtained without intravenous contrast. COMPARISON:  Prior CT from 06/17/2021. FINDINGS: Brain: Cerebral volume within normal limits for age. Scattered subcentimeter foci of T2/FLAIR hyperintensity seen involving the periventricular and deep white matter both cerebral hemispheres, nonspecific, but most likely related to chronic microvascular ischemic disease, mild in nature. No abnormal foci of restricted diffusion to suggest acute or subacute ischemia. Gray-white matter differentiation well maintained. No encephalomalacia to suggest chronic cortical infarction. No evidence for acute or chronic intracranial hemorrhage. No mass lesion, midline shift or mass effect. No hydrocephalus or extra-axial fluid collection. Pituitary gland suprasellar region within normal limits. Midline structures intact and normal. Vascular: Major intracranial vascular flow voids are well maintained. Skull and upper  cervical spine: Craniocervical junction within normal limits. Bone marrow signal intensity normal. No scalp soft tissue abnormality. Sinuses/Orbits: Globes and orbital soft tissues demonstrate no acute finding. Patient status post bilateral ocular lens replacement. Few scattered retention cyst noted about the paranasal sinuses. No significant mastoid effusion.  Inner ear structures grossly normal. Other: None. IMPRESSION: 1. No acute intracranial abnormality. 2. Mild chronic microvascular ischemic disease for age. Electronically Signed   By: Jeannine Boga M.D.   On: 06/18/2021 01:45    Procedures Procedures   Medications Ordered in ED Medications  LORazepam (ATIVAN) injection 0.5 mg (0.5 mg Intravenous Given 06/18/21 0027)    ED Course  I have reviewed the triage vital signs and the nursing notes.  Pertinent labs & imaging results that were available during my care of the patient were reviewed by me and considered in my medical decision making (see chart for details).    MDM Rules/Calculators/A&P                          Patient's symptoms seem consistent with a likely TIA.  We will follow-up with.  She is neurologically intact at this time.  Final Clinical Impression(s) / ED Diagnoses Final diagnoses:  TIA (transient ischemic attack)    Rx / DC Orders ED Discharge Orders          Ordered    Ambulatory referral to Neurology       Comments: An appointment is requested in approximately: 2 weeks   06/18/21 0305             Vir Whetstine, Corene Cornea, MD 06/18/21 [phone]

## 2021-06-18 NOTE — Progress Notes (Addendum)
Called and had to leave a message for the patient to start taking her 81mg  aspirin everyday and to please call if any questions or concerns   Debbora Dus, CPP notified  Avel Sensor, [place] Assistant [phone]  I have reviewed the care management and care coordination activities outlined in this encounter and I am certifying that I agree with the content of this note. No further action required.  Debbora Dus, PharmD Clinical Pharmacist [facility] ([place]) [phone]

## 2021-06-18 NOTE — ED Notes (Signed)
Follow up care reviewed and explained, pt verbalized understanding.

## 2021-06-19 ENCOUNTER — Telehealth: Payer: Self-pay

## 2021-06-19 NOTE — Telephone Encounter (Signed)
[facility] Night - Client Nonclinical Telephone Record AccessNurse Client [place] Night - Client Client Site [place] - Night Physician Eliezer Lofts - MD Contact Type Call Who Is Calling Patient / Member / Family / Caregiver Caller Name Candise Bowens Phone Number [phone] Patient Name Renee [name] Patient DOB Apr 27, 1946 Call Type Message Only Information Provided Reason for Call Request to Schedule Office Appointment Initial Comment Caller states her mother is needing to schedule an appointment. Patient request to speak to RN No Additional Comment Declined triage. Office hours provided. Disp. Time Disposition Final User 06/18/2021 5:07:13 PM General Information Provided Yes Uvaldo Rising Call Closed By: Uvaldo Rising Transaction Date/Time: 06/18/2021 5:02:48 PM (ET)

## 2021-06-25 ENCOUNTER — Other Ambulatory Visit: Payer: Self-pay

## 2021-06-25 ENCOUNTER — Ambulatory Visit (INDEPENDENT_AMBULATORY_CARE_PROVIDER_SITE_OTHER): Payer: Medicare Other | Admitting: Neurology

## 2021-06-25 ENCOUNTER — Encounter: Payer: Self-pay | Admitting: Neurology

## 2021-06-25 VITALS — BP 154/71 | HR 79 | Ht 64.0 in | Wt 148.6 lb

## 2021-06-25 DIAGNOSIS — G43009 Migraine without aura, not intractable, without status migrainosus: Secondary | ICD-10-CM | POA: Diagnosis not present

## 2021-06-25 DIAGNOSIS — M5481 Occipital neuralgia: Secondary | ICD-10-CM

## 2021-06-25 DIAGNOSIS — G459 Transient cerebral ischemic attack, unspecified: Secondary | ICD-10-CM

## 2021-06-25 DIAGNOSIS — H538 Other visual disturbances: Secondary | ICD-10-CM

## 2021-06-25 NOTE — Patient Instructions (Addendum)
I had a long discussion with the patient and her daughter regarding her episode of transient blurred vision, difficulty thinking and right hand numbness and discussed differential diagnosis which includes posterior circulation TIA versus atypical migraine and answered questions.  I recommend further evaluation with checking CT angiogram of the brain and neck, ESR and temporal artery ultrasound.  She will continue aspirin 81 mg daily for stroke prevention and maintain aggressive risk factor modification with strict control of hypertension with blood pressure goal below 130/90, lipids with LDL cholesterol goal below 70 mg percent and diabetes with hemoglobin A1c goal below 6.5%.  She will return for follow-up in the future in 2 months or call earlier if necessary.

## 2021-06-25 NOTE — Progress Notes (Signed)
[facility] [address] [place]. [place] [address] [phone]       OFFICE CONSULT NOTE  [name] Date of Birth:  1946-06-01 Medical Record Number:  [id number]   Referring MD:  Merrily Pew  Reason for Referral:  TIA  HPI: Renee [name] is a pleasant 75 year old Caucasian lady who is referred today for initial office consultation visit for TIA.  History is obtained from the patient, her daughter as well as review of electronic medical records and I personally reviewed pertinent imaging films in PACS.  She has a past medical history of coronary artery disease, diabetes, hypertension, hyperlipidemia, hypothyroidism and arthritis.  She states she had episode of sudden onset of blurred vision and dark vision when she woke up from sleep on 06/17/2021.  She was able to see but vision was not clear.  She checked her blood sugar which was fine.  She also noticed that she has some trouble thinking straight and trouble speaking.  She went to a restaurant where she had trouble ordering her food eventually she was able to place an order.  She subsequently had a meal and sat in a car and while driving to the gynecologist office noticed numbness in the right hand from the wrist down.  She also felt that her right cheek of her cheek was being pulled to the side.  When she continues with annual gynecology she advised her to go to the ER.  Her symptoms are completely resolved by the time she reached there.  Patient denied any accompanying headache, double vision, vertigo, gait or balance problems.  Patient said she had a somewhat similar episode a couple of years ago when she also had some difficulty with vision, focusing as well as trouble thinking and word finding which also did not stay long.  She did not have a headache after that.  She did not seek medical help at that time.  Patient does have a history of remote migraines but she states she is outgrown them and has not had one for  years.  2 of her daughters also have migraines.  She denies any other prior history of strokes or TIAs or seizures.  She had a CT scan of the head on 06/17/2021 which showed possibly a right basal ganglia lacunar however an MRI scan done the next day showed no evidence of acute stroke or old strokes and only showed age-related atrophy and white matter changes.  Lab work on 04/27/2021 showed hemoglobin A1c of 7.3 and LDL cholesterol 140 mg percent.  Patient also has complaints of headache with posterior neck pain with tenderness in the left occipital groove.  She also complains of muscle aches and pains and in fact has an appointment soon to see rheumatologist Dr. Estanislado Pandy for these complaints..  ROS:   14 system review of systems is positive for blurred vision, speech difficulty, difficulty figuring out things, numbness and headache and neck pain and all other systems negative  PMH:  Past Medical History:  Diagnosis Date   Allergy    Anxiety    Arthritis    CAD (coronary artery disease)    non-obstructive. cath 11/11   Diabetes mellitus    borderline diabetic - diet controlled    History of chicken pox    History of phobia    clastrophobia   Hyperlipidemia    Hypertension    Hypothyroidism    Kidney stones    hx of    Lichen sclerosus  Psoriasis    Stroke ([place])    Urinary tract infection    hx of     Social History:  Social History   Socioeconomic History   Marital status: Divorced    Spouse name: Not on file   Number of children: 6   Years of education: Not on file   Highest education level: Not on file  Occupational History   Occupation: Retired  Tobacco Use   Smoking status: Never   Smokeless tobacco: Never  Vaping Use   Vaping Use: Never used  Substance and Sexual Activity   Alcohol use: Yes    Alcohol/week: 0.0 standard drinks    Comment: occasionally   Drug use: No   Sexual activity: Not Currently    Birth control/protection: Post-menopausal  Other Topics  Concern   Not on file  Social History Narrative   Lives alone   Right Handed   Drink 1 cups caffeine daily   Social Determinants of Health   Financial Resource Strain: Low Risk    Difficulty of Paying Living Expenses: Not very hard  Food Insecurity: No Food Insecurity   Worried About Charity fundraiser in the Last Year: Never true   Ran Out of Food in the Last Year: Never true  Transportation Needs: No Transportation Needs   Lack of Transportation (Medical): No   Lack of Transportation (Non-Medical): No  Physical Activity: Inactive   Days of Exercise per Week: 0 days   Minutes of Exercise per Session: 0 min  Stress: Stress Concern Present   Feeling of Stress : To some extent  Social Connections: Not on file  Intimate Partner Violence: Not At Risk   Fear of Current or Ex-Partner: No   Emotionally Abused: No   Physically Abused: No   Sexually Abused: No    Medications:   Current Outpatient Medications on File Prior to Visit  Medication Sig Dispense Refill   ACCU-CHEK COMPACT PLUS test strip USE AS DIRECTED TO TEST BLOOD SUGAR DAILY (Patient taking differently: 1 each by Other route See admin instructions. Check blood sugar twice a week) 100 each 1   acetaminophen (TYLENOL) 650 MG CR tablet Take 650 mg by mouth every 8 (eight) hours as needed for pain.     aspirin EC 81 MG tablet Take 81 mg by mouth daily. Every other day     Blood Glucose Monitoring Suppl (ACCU-CHEK COMPACT CARE KIT) KIT Meter Kit 1 each 0   Carboxymethylcellulose Sodium (THERATEARS OP) Place 1 drop into both eyes in the morning and at bedtime.     clobetasol ointment (TEMOVATE) 3.32 % Apply 1 application topically daily as needed (psoriasis flares). Please given in jars instead of tubes 45 g 1   clobetasol ointment (TEMOVATE) 0.05 % Apply to affected area every night for 4 weeks, then every other day for 4 weeks and then twice a week for 4 weeks or until resolution. 60 g 5   Emollient (CERAVE) CREA Apply 1  application topically daily as needed (psoriasis).     ezetimibe (ZETIA) 10 MG tablet Take 1 tablet (10 mg total) by mouth daily. 90 tablet 3   famotidine (PEPCID) 20 MG tablet Take 20 mg by mouth daily as needed for heartburn or indigestion.     Flaxseed, Linseed, (FLAX SEED OIL PO) Take 1 capsule by mouth daily.     fluticasone (FLONASE) 50 MCG/ACT nasal spray Place 1 spray into both nostrils daily as needed for allergies.     glipiZIDE (  GLUCOTROL XL) 5 MG 24 hr tablet TAKE 1 TABLET BY MOUTH EVERY DAY WITH BREAKFAST (Patient taking differently: Take 5 mg by mouth daily with breakfast.) 90 tablet 1   ibuprofen (ADVIL,MOTRIN) 200 MG tablet Take 600-800 mg by mouth every 6 (six) hours as needed for moderate pain.     levothyroxine (SYNTHROID) 75 MCG tablet TAKE 1 TABLET DAILY (Patient taking differently: Take 75 mcg by mouth daily before breakfast.) 90 tablet 3   losartan (COZAAR) 50 MG tablet Take 1 tablet (50 mg total) by mouth daily. 90 tablet 3   Multiple Vitamin (MULTIVITAMIN) tablet Take 1 tablet by mouth every other day.      nitroGLYCERIN (NITROSTAT) 0.4 MG SL tablet Place 1 tablet (0.4 mg total) under the tongue every 5 (five) minutes as needed. 30 tablet 6   predniSONE (DELTASONE) 20 MG tablet 3 tabs by mouth daily x 3 days, then 2 tabs by mouth daily x 2 days then 1 tab by mouth daily x 2 days 15 tablet 0   Skin Protectants, Misc. (EUCERIN) cream Apply 1 application topically See admin instructions. Eczema relief applies to arms and hands daily     triamcinolone cream (KENALOG) 0.1 % Apply 1 application topically 2 (two) times daily as needed (psoriasis).     Vitamin D, Cholecalciferol, 1000 units TABS Take 2,000 Units by mouth daily.     No current facility-administered medications on file prior to visit.    Allergies:   Allergies  Allergen Reactions   Other Rash    Metal    Celexa [Citalopram] Other (See Comments)    Throat swelling and difficulty breathing   Codeine Nausea And  Vomiting   Metformin Nausea And Vomiting   Robaxin [Methocarbamol] Nausea Only   Crestor [Rosuvastatin] Rash   Simvastatin Swelling and Rash    Physical Exam General: well developed, well nourished elderly Caucasian lady, seated, in no evident distress Head: head normocephalic and atraumatic.   Neck: supple with no carotid or supraclavicular bruits Cardiovascular: regular rate and rhythm, no murmurs Musculoskeletal: no deformity tenderness left occipital groove Skin:  no rash/petichiae Vascular:  Normal pulses all extremities no tenderness of superficial temporal artery.  Neurologic Exam Mental Status: Awake and fully alert. Oriented to place and time. Recent and remote memory intact. Attention span, concentration and fund of knowledge appropriate. Mood and affect appropriate.  Cranial Nerves: Fundoscopic exam reveals sharp disc margins. Pupils equal, briskly reactive to light. Extraocular movements full without nystagmus. Visual fields full to confrontation. Hearing intact. Facial sensation intact. Face, tongue, palate moves normally and symmetrically.  Motor: Normal bulk and tone. Normal strength in all tested extremity muscles. Sensory.: intact to touch , pinprick , position and vibratory sensation.  Coordination: Rapid alternating movements normal in all extremities. Finger-to-nose and heel-to-shin performed accurately bilaterally. Gait and Station: Arises from chair without difficulty. Stance is normal. Gait demonstrates normal stride length and balance . Able to heel, toe and tandem walk with moderate difficulty.  Reflexes: 1+ and symmetric. Toes downgoing.   NIHSS  0 Modified Rankin  1   ASSESSMENT: 75 year old pleasant Caucasian lady with transient episode of vision and cognitive difficulties, numbness possibly posterior circulation TIAs versus atypical migraine.  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     PLAN: I had a long discussion  with the patient and her daughter regarding her episode of transient blurred vision, difficulty thinking and right hand numbness and discussed differential diagnosis which includes posterior circulation TIA versus atypical  migraine and answered questions.  I recommend further evaluation with checking CT angiogram of the brain and neck, ESR and temporal artery ultrasound.  She will continue aspirin 81 mg daily for stroke prevention and maintain aggressive risk factor modification with strict control of hypertension with blood pressure goal below 130/90, lipids with LDL cholesterol goal below 70 mg percent and diabetes with hemoglobin A1c goal below 6.5%.  She will return for follow-up in the future in 2 months or call earlier if necessary.  Greater than 50% time during this 45-minute consultation visit was spent on counseling and coordination of care about her episode of TIA versus atypical migraine and discussion about headaches and answering questions Antony Contras, MD Note: This document was prepared with digital dictation and possible smart phrase technology. Any transcriptional errors that result from this process are unintentional.

## 2021-06-26 ENCOUNTER — Other Ambulatory Visit: Payer: Self-pay

## 2021-06-26 ENCOUNTER — Encounter: Payer: Self-pay | Admitting: Family Medicine

## 2021-06-26 ENCOUNTER — Ambulatory Visit (INDEPENDENT_AMBULATORY_CARE_PROVIDER_SITE_OTHER): Payer: Medicare Other | Admitting: Family Medicine

## 2021-06-26 VITALS — BP 150/67 | HR 73 | Temp 98.1°F | Ht 64.0 in | Wt 146.0 lb

## 2021-06-26 DIAGNOSIS — I25118 Atherosclerotic heart disease of native coronary artery with other forms of angina pectoris: Secondary | ICD-10-CM | POA: Diagnosis not present

## 2021-06-26 DIAGNOSIS — Z8673 Personal history of transient ischemic attack (TIA), and cerebral infarction without residual deficits: Secondary | ICD-10-CM | POA: Diagnosis not present

## 2021-06-26 DIAGNOSIS — I152 Hypertension secondary to endocrine disorders: Secondary | ICD-10-CM

## 2021-06-26 DIAGNOSIS — E1159 Type 2 diabetes mellitus with other circulatory complications: Secondary | ICD-10-CM | POA: Diagnosis not present

## 2021-06-26 DIAGNOSIS — E1169 Type 2 diabetes mellitus with other specified complication: Secondary | ICD-10-CM | POA: Diagnosis not present

## 2021-06-26 DIAGNOSIS — E785 Hyperlipidemia, unspecified: Secondary | ICD-10-CM

## 2021-06-26 LAB — SEDIMENTATION RATE: Sed Rate: 15 mm/hr (ref 0–40)

## 2021-06-26 NOTE — Patient Instructions (Addendum)
Follow BPat home... goal < 130/90. Call with measurement in 1-2 weeks. If above goal.. we will plan on increasing the losartan to 100 mg daily.   Continue Gluctrol Xl 5 mg daily... start Trulicity weekly. Check fasting blood sugar daily. Occ postprandial checks.

## 2021-06-26 NOTE — Assessment & Plan Note (Signed)
Now back on zetia for last 2 months.. will recheck lipids prior.  Encouraged exercise, weight loss, healthy eating habits.    Statin intolerant in past.

## 2021-06-26 NOTE — Assessment & Plan Note (Addendum)
Recommendation per neurology. Reviewed note and recommendations in detail with patient  Recommend further evaluation with checking CT angiogram of the brain and neck, ESR and temporal artery ultrasound.  She will continue aspirin 81 mg daily for stroke prevention and maintain aggressive risk factor modification with strict control of hypertension with blood pressure goal below 130/90, lipids with LDL cholesterol goal below 70 mg percent and diabetes with hemoglobin A1c goal below 6.5%.

## 2021-06-26 NOTE — Progress Notes (Signed)
Kindly inform the patient that erythrocyte sedimentation rate was normal.  This is a marker for inflammation which is sometimes elevated in patients with temporal arteritis.

## 2021-06-26 NOTE — Progress Notes (Signed)
Patient ID: Renee [name], female    DOB: 1946/08/09, 75 y.o.   MRN: [id number]  This visit was conducted in person.  BP (!) 150/67 Comment: With Patient's Wrist Monitor  Pulse 73   Temp 98.1 F (36.7 C) (Temporal)   Ht 5\' 4"  (1.626 m)   Wt 146 lb (66.2 kg)   SpO2 97%   BMI 25.06 kg/m    CC: Chief Complaint  Patient presents with   Follow-up    ED Visit for TIA    Subjective:   HPI: Renee [name] is a 75 y.o. female  with history of  DM ( last A1c 7.3) presenting on 06/26/2021 for Follow-up (ED Visit for TIA)  Seen in ED on 06/17/2021 for TIA symptoms. Summary copied below.   Suddenly patient woke up this morning had on and off again blurry vision associated with some blackening of her vision as well.    tightness in the right side of her face.  She also had complete numbness of her whole right hand from the wrist down.  Normal labs and UA.  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   MRI brain showed: IMPRESSION: 1. No acute intracranial abnormality. 2. Mild chronic microvascular ischemic disease for age.     She neurology yesterday.. note reviewed in detail. Dr. 06/19/2021 recommend further evaluation with checking CT angiogram of the brain and neck, ESR and temporal artery ultrasound.  She will continue aspirin 81 mg daily for stroke prevention and maintain aggressive risk factor modification with strict control of hypertension with blood pressure goal below 130/90, lipids with LDL cholesterol goal below 70 mg percent and diabetes with hemoglobin A1c goal below 6.5%.   Both chol and DM not at ideal control.   Glucotrol 5 mg daily XL.. she does have trulicity at home. Lab Results  Component Value Date   HGBA1C 7.3 (H) 04/27/2021   Was of zetia from 10/21 to 04/2021.. restarted back on zetia Lab Results  Component Value Date   CHOL 211 (H) 04/27/2021   HDL 41.50 04/27/2021   LDLCALC 140 (H) 04/27/2021   TRIG 146.0 04/27/2021   CHOLHDL 5 04/27/2021   BP not at  goal < 130/90. 134/64 BP Readings from Last 3 Encounters:  06/26/21 (!) 150/67  06/25/21 (!) 154/71  06/18/21 (!) 182/80     Relevant past medical, surgical, family and social history reviewed and updated as indicated. Interim medical history since our last visit reviewed. Allergies and medications reviewed and updated. Outpatient Medications Prior to Visit  Medication Sig Dispense Refill   ACCU-CHEK COMPACT PLUS test strip USE AS DIRECTED TO TEST BLOOD SUGAR DAILY (Patient taking differently: 1 each by Other route See admin instructions. Check blood sugar twice a week) 100 each 1   acetaminophen (TYLENOL) 650 MG CR tablet Take 650 mg by mouth every 8 (eight) hours as needed for pain.     aspirin EC 81 MG tablet Take by mouth daily.     Blood Glucose Monitoring Suppl (ACCU-CHEK COMPACT CARE KIT) KIT Meter Kit 1 each 0   Carboxymethylcellulose Sodium (THERATEARS OP) Place 1 drop into both eyes at bedtime as needed.     clobetasol ointment (TEMOVATE) 0.05 % Apply 1 application topically daily as needed (psoriasis flares). Please given in jars instead of tubes 45 g 1   clobetasol ointment (TEMOVATE) 0.05 % Apply to affected area every night for 4 weeks, then every other day for 4 weeks and then twice  a week for 4 weeks or until resolution. 60 g 5   Emollient (CERAVE) CREA Apply 1 application topically daily as needed (psoriasis).     ezetimibe (ZETIA) 10 MG tablet Take 1 tablet (10 mg total) by mouth daily. 90 tablet 3   famotidine (PEPCID) 20 MG tablet Take 20 mg by mouth daily as needed for heartburn or indigestion.     Flaxseed, Linseed, (FLAX SEED OIL PO) Take 1 capsule by mouth daily.     fluticasone (FLONASE) 50 MCG/ACT nasal spray Place 1 spray into both nostrils daily as needed for allergies.     glipiZIDE (GLUCOTROL XL) 5 MG 24 hr tablet TAKE 1 TABLET BY MOUTH EVERY DAY WITH BREAKFAST (Patient taking differently: Take 5 mg by mouth daily with breakfast.) 90 tablet 1   ibuprofen  (ADVIL,MOTRIN) 200 MG tablet Take 600-800 mg by mouth every 6 (six) hours as needed for moderate pain.     levothyroxine (SYNTHROID) 75 MCG tablet TAKE 1 TABLET DAILY (Patient taking differently: Take 75 mcg by mouth daily before breakfast.) 90 tablet 3   losartan (COZAAR) 50 MG tablet Take 1 tablet (50 mg total) by mouth daily. 90 tablet 3   Multiple Vitamin (MULTIVITAMIN) tablet Take 1 tablet by mouth every other day.      nitroGLYCERIN (NITROSTAT) 0.4 MG SL tablet Place 1 tablet (0.4 mg total) under the tongue every 5 (five) minutes as needed. 30 tablet 6   predniSONE (DELTASONE) 20 MG tablet 3 tabs by mouth daily x 3 days, then 2 tabs by mouth daily x 2 days then 1 tab by mouth daily x 2 days 15 tablet 0   Skin Protectants, Misc. (EUCERIN) cream Apply 1 application topically See admin instructions. Eczema relief applies to arms and hands daily     triamcinolone cream (KENALOG) 0.1 % Apply 1 application topically 2 (two) times daily as needed (psoriasis).     Vitamin D, Cholecalciferol, 1000 units TABS Take 2,000 Units by mouth daily.     No facility-administered medications prior to visit.     Per HPI unless specifically indicated in ROS section below Review of Systems  Constitutional:  Negative for fatigue and fever.  HENT:  Negative for ear pain.   Eyes:  Negative for pain.  Respiratory:  Negative for chest tightness and shortness of breath.   Cardiovascular:  Negative for chest pain, palpitations and leg swelling.  Gastrointestinal:  Negative for abdominal pain.  Genitourinary:  Negative for dysuria.  Objective:  BP (!) 150/67 Comment: With Patient's Wrist Monitor  Pulse 73   Temp 98.1 F (36.7 C) (Temporal)   Ht $R'5\' 4"'bQ$  (1.626 m)   Wt 146 lb (66.2 kg)   SpO2 97%   BMI 25.06 kg/m   Wt Readings from Last 3 Encounters:  06/26/21 146 lb (66.2 kg)  06/25/21 148 lb 9.6 oz (67.4 kg)  06/18/21 142 lb (64.4 kg)      Physical Exam Constitutional:      General: She is not in acute  distress.    Appearance: Normal appearance. She is well-developed. She is not ill-appearing or toxic-appearing.  HENT:     Head: Normocephalic.     Right Ear: Hearing, tympanic membrane, ear canal and external ear normal. Tympanic membrane is not erythematous, retracted or bulging.     Left Ear: Hearing, tympanic membrane, ear canal and external ear normal. Tympanic membrane is not erythematous, retracted or bulging.     Nose: No mucosal edema or rhinorrhea.  Right Sinus: No maxillary sinus tenderness or frontal sinus tenderness.     Left Sinus: No maxillary sinus tenderness or frontal sinus tenderness.     Mouth/Throat:     Pharynx: Uvula midline.  Eyes:     General: Lids are normal. Lids are everted, no foreign bodies appreciated.     Conjunctiva/sclera: Conjunctivae normal.     Pupils: Pupils are equal, round, and reactive to light.  Neck:     Thyroid: No thyroid mass or thyromegaly.     Vascular: No carotid bruit.     Trachea: Trachea normal.  Cardiovascular:     Rate and Rhythm: Normal rate and regular rhythm.     Pulses: Normal pulses.     Heart sounds: Normal heart sounds, S1 normal and S2 normal. No murmur heard.   No friction rub. No gallop.  Pulmonary:     Effort: Pulmonary effort is normal. No tachypnea or respiratory distress.     Breath sounds: Normal breath sounds. No decreased breath sounds, wheezing, rhonchi or rales.  Abdominal:     General: Bowel sounds are normal.     Palpations: Abdomen is soft.     Tenderness: There is no abdominal tenderness.  Musculoskeletal:     Cervical back: Normal range of motion and neck supple.  Skin:    General: Skin is warm and dry.     Findings: No rash.  Neurological:     Mental Status: She is alert.  Psychiatric:        Mood and Affect: Mood is not anxious or depressed.        Speech: Speech normal.        Behavior: Behavior normal. Behavior is cooperative.        Thought Content: Thought content normal.        Judgment:  Judgment normal.      Results for orders placed or performed in visit on 06/25/21  Sedimentation Rate  Result Value Ref Range   Sed Rate 15 0 - 40 mm/hr    This visit occurred during the SARS-CoV-2 public health emergency.  Safety protocols were in place, including screening questions prior to the visit, additional usage of staff PPE, and extensive cleaning of exam room while observing appropriate contact time as indicated for disinfecting solutions.   COVID 19 screen:  No recent travel or known exposure to COVID19 The patient denies respiratory symptoms of COVID 19 at this time. The importance of social distancing was discussed today.   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 start Trulicity weekly. Check fasting blood sugar daily. Occ postprandial checks. Plan wean off glucotrol over time.      History of TIA (transient ischemic attack) - Primary    Recommendation per neurology. Reviewed note and recommendations in detail with patient  Recommend further evaluation with checking CT angiogram of the brain and neck, ESR and temporal artery ultrasound.  She will continue aspirin 81 mg daily for stroke prevention and maintain aggressive risk factor modification with strict control of hypertension with blood pressure goal below 130/90, lipids with LDL cholesterol goal below 70 mg percent and diabetes with hemoglobin A1c goal below 6.5%.      Hyperlipidemia associated with type 2 diabetes mellitus ([place])     Now back on zetia for last 2 months.. will recheck lipids  prior.  Encouraged exercise, weight loss, healthy eating habits.    Statin intolerant in past.      Hypertension associated with diabetes ([place])    Follow BPat home... goal < 130/90. Call with measurement in 1-2 weeks. If above goal.. we will plan on increasing the losartan to 100 mg  daily.        Eliezer Lofts, MD

## 2021-06-30 ENCOUNTER — Telehealth: Payer: Self-pay | Admitting: *Deleted

## 2021-06-30 ENCOUNTER — Telehealth: Payer: Self-pay

## 2021-06-30 DIAGNOSIS — E1159 Type 2 diabetes mellitus with other circulatory complications: Secondary | ICD-10-CM

## 2021-06-30 MED ORDER — ONETOUCH ULTRASOFT LANCETS MISC: 11 refills | Status: DC

## 2021-06-30 MED ORDER — ONETOUCH VERIO VI STRP: ORAL_STRIP | 11 refills | Status: DC

## 2021-06-30 NOTE — Telephone Encounter (Signed)
-----   Message from Garvin Fila, MD sent at 06/26/2021  3:47 PM EDT ----- Renee [name] inform the patient that erythrocyte sedimentation rate was normal.  This is a marker for inflammation which is sometimes elevated in patients with temporal arteritis.

## 2021-06-30 NOTE — Telephone Encounter (Signed)
Glucose meter is [employer].  Rx for test strips and lancets sent to [facility] in [place].

## 2021-06-30 NOTE — Telephone Encounter (Signed)
I spoke to patient's dgt on DPR and provided her with the lab results.

## 2021-06-30 NOTE — Telephone Encounter (Signed)
Left message for Renee [name] to call back and verify the name of her current glucose meter she is using.

## 2021-06-30 NOTE — Telephone Encounter (Signed)
Received call from patient states insurance will not pay if frequency of testing each day is not on script would like to have Renee [name] take care of it for her. Will forward message.

## 2021-07-01 ENCOUNTER — Ambulatory Visit (HOSPITAL_COMMUNITY): Payer: Medicare Other

## 2021-07-10 ENCOUNTER — Other Ambulatory Visit: Payer: Self-pay

## 2021-07-10 ENCOUNTER — Encounter: Payer: Self-pay | Admitting: Family Medicine

## 2021-07-10 ENCOUNTER — Ambulatory Visit (INDEPENDENT_AMBULATORY_CARE_PROVIDER_SITE_OTHER): Payer: Medicare Other | Admitting: Family Medicine

## 2021-07-10 VITALS — BP 128/66 | HR 82 | Temp 97.8°F | Ht 64.0 in | Wt 146.0 lb

## 2021-07-10 DIAGNOSIS — I152 Hypertension secondary to endocrine disorders: Secondary | ICD-10-CM | POA: Diagnosis not present

## 2021-07-10 DIAGNOSIS — E1159 Type 2 diabetes mellitus with other circulatory complications: Secondary | ICD-10-CM

## 2021-07-10 NOTE — Progress Notes (Signed)
Patient ID: Renee [name], female    DOB: September 03, 1946, 75 y.o.   MRN: [id number]  This visit was conducted in person.  BP 128/66   Pulse 82   Temp 97.8 F (36.6 C) (Temporal)   Ht $R'5\' 4"'uC$  (1.626 m)   Wt 146 lb (66.2 kg)   SpO2 98%   BMI 25.06 kg/m    CC: Chief Complaint  Patient presents with   2 Week Follow-up    Subjective:   HPI: Renee [name] is a 75 y.o. female presenting on 07/10/2021 for 2 Week Follow-up  Hypertension:  BP at goal on current regimen BP Readings from Last 3 Encounters:  07/10/21 128/66  06/26/21 (!) 150/67  06/25/21 (!) 154/71  Using medication without problems or lightheadedness: none Chest pain with exertion:none Edema:none Short of breath:none Average home BPs: reviewed lab Other issues:  Diabetes:  2 week post  Trulicity start. Also on glucotrol XL 5 mg daily Using medications without difficulties: None Hypoglycemic episodes:one.. felt shaky when 82 Hyperglycemic episodes:none Feet problems: none Blood Sugars averaging: FBS 110-126, post prandial 84-137n eye exam within last year: Wt Readings from Last 3 Encounters:  07/10/21 146 lb (66.2 kg)  06/26/21 146 lb (66.2 kg)  06/25/21 148 lb 9.6 oz (67.4 kg)   Lab Results  Component Value Date   HGBA1C 7.3 (H) 04/27/2021         Relevant past medical, surgical, family and social history reviewed and updated as indicated. Interim medical history since our last visit reviewed. Allergies and medications reviewed and updated. Outpatient Medications Prior to Visit  Medication Sig Dispense Refill   acetaminophen (TYLENOL) 650 MG CR tablet Take 650 mg by mouth every 8 (eight) hours as needed for pain.     aspirin EC 81 MG tablet Take by mouth daily.     Blood Glucose Monitoring Suppl (ONETOUCH VERIO) w/Device KIT by Does not apply route. Use to check fasting blood sugar daily and an occasional 2 hr postprandial     Carboxymethylcellulose Sodium (THERATEARS OP) Place 1 drop into both eyes at  bedtime as needed.     clobetasol ointment (TEMOVATE) 2.70 % Apply 1 application topically daily as needed (psoriasis flares). Please given in jars instead of tubes 45 g 1   clobetasol ointment (TEMOVATE) 0.05 % Apply to affected area every night for 4 weeks, then every other day for 4 weeks and then twice a week for 4 weeks or until resolution. 60 g 5   Emollient (CERAVE) CREA Apply 1 application topically daily as needed (psoriasis).     ezetimibe (ZETIA) 10 MG tablet Take 1 tablet (10 mg total) by mouth daily. 90 tablet 3   famotidine (PEPCID) 20 MG tablet Take 20 mg by mouth daily as needed for heartburn or indigestion.     Flaxseed, Linseed, (FLAX SEED OIL PO) Take 1 capsule by mouth daily.     fluticasone (FLONASE) 50 MCG/ACT nasal spray Place 1 spray into both nostrils daily as needed for allergies.     glipiZIDE (GLUCOTROL XL) 5 MG 24 hr tablet TAKE 1 TABLET BY MOUTH EVERY DAY WITH BREAKFAST (Patient taking differently: Take 5 mg by mouth daily with breakfast.) 90 tablet 1   glucose blood (ONETOUCH VERIO) test strip Use to check fasting blood sugar daily and an occasional 2 hr postprandial. 100 each 11   ibuprofen (ADVIL,MOTRIN) 200 MG tablet Take 600-800 mg by mouth every 6 (six) hours as needed for moderate pain.  Lancets (ONETOUCH ULTRASOFT) lancets Use to check fasting blood sugar daily and an occasional 2 hr postprandial 100 each 11   levothyroxine (SYNTHROID) 75 MCG tablet TAKE 1 TABLET DAILY (Patient taking differently: Take 75 mcg by mouth daily before breakfast.) 90 tablet 3   losartan (COZAAR) 50 MG tablet Take 1 tablet (50 mg total) by mouth daily. 90 tablet 3   Multiple Vitamin (MULTIVITAMIN) tablet Take 1 tablet by mouth every other day.      nitroGLYCERIN (NITROSTAT) 0.4 MG SL tablet Place 1 tablet (0.4 mg total) under the tongue every 5 (five) minutes as needed. 30 tablet 6   Skin Protectants, Misc. (EUCERIN) cream Apply 1 application topically See admin instructions. Eczema  relief applies to arms and hands daily     triamcinolone cream (KENALOG) 0.1 % Apply 1 application topically 2 (two) times daily as needed (psoriasis).     Vitamin D, Cholecalciferol, 1000 units TABS Take 2,000 Units by mouth daily.     No facility-administered medications prior to visit.     Per HPI unless specifically indicated in ROS section below Review of Systems  Constitutional:  Negative for fatigue and fever.  HENT:  Negative for congestion.   Eyes:  Negative for pain.  Respiratory:  Negative for cough and shortness of breath.   Cardiovascular:  Negative for chest pain, palpitations and leg swelling.  Gastrointestinal:  Negative for abdominal pain.  Genitourinary:  Negative for dysuria and vaginal bleeding.  Musculoskeletal:  Negative for back pain.  Neurological:  Negative for syncope, light-headedness and headaches.  Psychiatric/Behavioral:  Negative for dysphoric mood.   Objective:  BP 128/66   Pulse 82   Temp 97.8 F (36.6 C) (Temporal)   Ht $R'5\' 4"'ky$  (1.626 m)   Wt 146 lb (66.2 kg)   SpO2 98%   BMI 25.06 kg/m   Wt Readings from Last 3 Encounters:  07/10/21 146 lb (66.2 kg)  06/26/21 146 lb (66.2 kg)  06/25/21 148 lb 9.6 oz (67.4 kg)      Physical Exam Constitutional:      General: She is not in acute distress.    Appearance: Normal appearance. She is well-developed. She is not ill-appearing or toxic-appearing.  HENT:     Head: Normocephalic.     Right Ear: Hearing, tympanic membrane, ear canal and external ear normal. Tympanic membrane is not erythematous, retracted or bulging.     Left Ear: Hearing, tympanic membrane, ear canal and external ear normal. Tympanic membrane is not erythematous, retracted or bulging.     Nose: No mucosal edema or rhinorrhea.     Right Sinus: No maxillary sinus tenderness or frontal sinus tenderness.     Left Sinus: No maxillary sinus tenderness or frontal sinus tenderness.     Mouth/Throat:     Pharynx: Uvula midline.  Eyes:      General: Lids are normal. Lids are everted, no foreign bodies appreciated.     Conjunctiva/sclera: Conjunctivae normal.     Pupils: Pupils are equal, round, and reactive to light.  Neck:     Thyroid: No thyroid mass or thyromegaly.     Vascular: No carotid bruit.     Trachea: Trachea normal.  Cardiovascular:     Rate and Rhythm: Normal rate and regular rhythm.     Pulses: Normal pulses.     Heart sounds: Normal heart sounds, S1 normal and S2 normal. No murmur heard.   No friction rub. No gallop.  Pulmonary:     Effort: Pulmonary  effort is normal. No tachypnea or respiratory distress.     Breath sounds: Normal breath sounds. No decreased breath sounds, wheezing, rhonchi or rales.  Abdominal:     General: Bowel sounds are normal.     Palpations: Abdomen is soft.     Tenderness: There is no abdominal tenderness.  Musculoskeletal:     Cervical back: Normal range of motion and neck supple.  Skin:    General: Skin is warm and dry.     Findings: No rash.  Neurological:     Mental Status: She is alert.  Psychiatric:        Mood and Affect: Mood is not anxious or depressed.        Speech: Speech normal.        Behavior: Behavior normal. Behavior is cooperative.        Thought Content: Thought content normal.        Judgment: Judgment normal.      Results for orders placed or performed in visit on 06/25/21  Sedimentation Rate  Result Value Ref Range   Sed Rate 15 0 - 40 mm/hr    This visit occurred during the SARS-CoV-2 public health emergency.  Safety protocols were in place, including screening questions prior to the visit, additional usage of staff PPE, and extensive cleaning of exam room while observing appropriate contact time as indicated for disinfecting solutions.   COVID 19 screen:  No recent travel or known exposure to COVID19 The patient denies respiratory symptoms of COVID 19 at this time. The importance of social distancing was discussed today.   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  No SE. Continue with plan to D/C glucotrol if CBGs dropping < 80 or if symptomatic frequently with numbers < 100.       Relevant Medications   Dulaglutide (TRULICITY) 4.01 UU/7.2ZD SOPN   Hypertension associated with diabetes ([place])    In office at goal.. at home 50% of BPs above goal 150/90. Will follow BP at home and continue on losartan 50 mg daily.  If BP continuing above goal, will need to increase medication.       Relevant Medications   Dulaglutide (TRULICITY) 6.64 QI/3.4VQ SOPN       Eliezer Lofts, MD

## 2021-07-10 NOTE — Patient Instructions (Addendum)
Continue Trulicity at current dose.  Continue Losartan at 50 mg daily.  Continue glucotrol 5 mg XL  for now... if fasting blood sugars dropping < 80 or feleing shaky frequently... you can hold this. Keep already scheduled appointments.

## 2021-07-10 NOTE — Assessment & Plan Note (Signed)
In office at goal.. at home 50% of BPs above goal 150/90. Will follow BP at home and continue on losartan 50 mg daily.  If BP continuing above goal, will need to increase medication.

## 2021-07-10 NOTE — Assessment & Plan Note (Signed)
IMproving control on Trulicity 6.41 mg weekly.  No SE. Continue with plan to D/C glucotrol if CBGs dropping < 80 or if symptomatic frequently with numbers < 100.

## 2021-07-15 NOTE — Progress Notes (Signed)
Office Visit Note  [name]             Date of Birth: 03-26-1946           MRN: [id number]             PCP: Jinny Sanders, MD Referring: Jinny Sanders, MD Visit Date: 07/29/2021 Occupation: @GUAROCC @  Subjective:  Pain in both shoulders.   History of Present Illness: Renee [name] is a 75 y.o. female seen in consultation per request of her PCP.  She is accompanied by her daughter Renee [name] to the office today.  According the patient she developed a rash on her hands when she was in her 40s and was told she had dermatitis.  About 2 years ago she was diagnosed with psoriasis by a dermatologist in [place] and was given topical agents.  She has had knee joint discomfort since 2014.  She underwent bilateral total knee replacement in 2016 by Dr.Alusio.  She did well after total knee replacement.  She states for the last 3 years she has been having pain and discomfort in her bilateral shoulders.  She has difficulty raising her arms and also has nocturnal pain.  She was seeing a rheumatologist in [place] who did not give her a diagnosis and advised Tylenol arthritis and Voltaren gel for discomfort.  She does not recall having x-rays of her shoulder joints.  She continues to have some neck discomfort and pain in her both shoulders.  She also has discomfort in her bilateral hands.  She denies any history of joint swelling.  Several years ago she had left carpal tunnel release and left third trigger finger release and had been doing well as regards to the surgery.  She was also evaluated by Dr. Leonie Man for neck pain who diagnosed her with cervical occipital neuralgias.  She also had x-ray of her cervical spine by Dr. Diona Browner which showed degenerative changes.  None of the other joints are painful.  She has seen a podiatrist in the past for foot discomfort.  She states the calluses were removed and her feet discomfort improved.  There is no family history of autoimmune disease.  Is gravida 6,  para 6, miscarriages 0.  There is no history of DVTs.  Activities of Daily Living:  Patient reports morning stiffness for 0 minutes.   Patient Reports nocturnal pain.  Difficulty dressing/grooming: Reports Difficulty climbing stairs: Denies Difficulty getting out of chair: Denies Difficulty using hands for taps, buttons, cutlery, and/or writing: Reports  Review of Systems  Constitutional:  Positive for fatigue.  HENT:  Positive for mouth dryness and nose dryness. Negative for mouth sores.   Eyes:  Positive for pain and dryness.  Respiratory:  Negative for shortness of breath and difficulty breathing.   Cardiovascular:  Negative for chest pain and palpitations.  Gastrointestinal:  Negative for blood in stool, constipation and diarrhea.  Endocrine: Negative for increased urination.  Genitourinary:  Negative for difficulty urinating.  Musculoskeletal:  Positive for joint pain, joint pain, joint swelling, myalgias, muscle tenderness and myalgias. Negative for morning stiffness.  Skin:  Positive for rash. Negative for color change.  Allergic/Immunologic: Negative for susceptible to infections.  Neurological:  Negative for dizziness, numbness, headaches and memory loss.  Hematological:  Positive for bruising/bleeding tendency.  Psychiatric/Behavioral:  Negative for confusion.    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 cath 11/11   Diabetes mellitus    borderline diabetic - diet controlled    History of chicken pox    History of phobia    clastrophobia   Hyperlipidemia    Hypertension    Hypothyroidism    Kidney stones    hx of    Lichen sclerosus    Psoriasis    Stroke ([place])    Urinary tract infection    hx of     Family History  Problem Relation Age of Onset   COPD Mother    Diabetes Mother    Hypertension Mother    Hypertension Father    Diabetes Sister    Hypertension Sister    Diabetes Brother    Arthritis Brother    Heart disease Brother    Arthritis Brother    Diabetes Brother    Heart disease Brother    Stomach cancer Maternal Grandfather    Stroke Maternal Grandfather    Breast cancer Daughter 66       Double mastectomy   Past Surgical History:  Procedure Laterality Date   APPENDECTOMY     CARDIAC CATHETERIZATION     2011   CARPAL TUNNEL RELEASE     Left trigger finger   CATARACT EXTRACTION W/PHACO Left 06/06/2018   Procedure: CATARACT EXTRACTION PHACO AND INTRAOCULAR LENS PLACEMENT ([place]);  Surgeon: Birder Robson, MD;  Location: [facility] ORS;  Service: Ophthalmology;  Laterality: Left;  [place]  00:23 AP% 16.0 CDE 3.69 Fluid pack lot # [id number]   CATARACT EXTRACTION W/PHACO Right 09/19/2018   Procedure: CATARACT EXTRACTION PHACO AND INTRAOCULAR LENS PLACEMENT (I[facility]);  Surgeon: Birder Robson, MD;  Location: [facility] ORS;  Service: Ophthalmology;  Laterality: Right;  [place] 00:29.5 AP% 17.0 CDE$ 5.03 Fluid pack lot # [id number]   COLONOSCOPY WITH PROPOFOL N/A 05/13/2017    Procedure: COLONOSCOPY WITH PROPOFOL;  Surgeon: Manya Silvas, MD;  Location[facility];  Service: Endoscopy;  Laterality: N/A;   ESOPHAGOGASTRODUODENOSCOPY (EGD) WITH PROPOFOL N/A 01/04/2020   Procedure: ESOPHAGOGASTRODUODENOSCOPY (EGD) WITH PROPOFOL;  Surgeon: [name], MD;  Location: [facility] ENDOSCOPY;  Service: Gastroenterology;  Laterality: N/A;   EYE SURGERY  2004   lasik - right eye    JOINT REPLACEMENT     KNEE SURGERY Right 2015   arthroscopy   TOTAL KNEE ARTHROPLASTY Bilateral 06/12/2015   Procedure: TOTAL KNEE BILATERAL;  Surgeon: Gaynelle Arabian, MD;  Location: [facility] ORS;  Service: Orthopedics;  Laterality: Bilateral;  and epidural   TUBAL LIGATION     Tubes tied     and untied. and tied again   Social History   Social History Narrative  Lives alone   Right Handed   Drink 1 cups caffeine daily   Immunization History  Administered Date(s) Administered   H1N1 11/02/2008   Influenza Split 10/22/2006, 10/28/2007, 09/26/2008, 09/13/2009, 09/12/2010, 09/18/2011, 09/27/2012   Influenza, High Dose Seasonal PF 09/11/2016, 08/28/2019, 09/22/2020   Influenza,inj,Quad PF,6+ Mos 09/13/2013, 09/13/2014, 09/20/2017, 09/25/2018   Influenza-Unspecified 08/27/2014, 09/18/2015   PFIZER(Purple Top)SARS-COV-2 Vaccination 01/03/2020, 01/26/2020, 11/05/2020   Pneumococcal Conjugate-13 04/05/2014   Pneumococcal Polysaccharide-23 09/26/2008, 02/08/2017   Tdap 09/27/2012     Objective: Vital Signs: BP (!) 163/92 (BP Location: Right Arm, Patient Position: Sitting, Cuff Size: Normal)   Pulse 66   Ht $R'5\' 3"'KJ$  (1.6 m)   Wt 146 lb 9.6 oz (66.5 kg)   BMI 25.97 kg/m    Physical Exam Vitals and nursing note reviewed.  Constitutional:      Appearance: She is well-developed.  HENT:     Head: Normocephalic and atraumatic.  Eyes:     Conjunctiva/sclera: Conjunctivae normal.  Cardiovascular:     Rate and Rhythm: Normal rate and regular rhythm.     Heart sounds: Normal heart sounds.   Pulmonary:     Effort: Pulmonary effort is normal.     Breath sounds: Normal breath sounds.  Abdominal:     General: Bowel sounds are normal.     Palpations: Abdomen is soft.  Musculoskeletal:     Cervical back: Normal range of motion.  Lymphadenopathy:     Cervical: No cervical adenopathy.  Skin:    General: Skin is warm and dry.     Capillary Refill: Capillary refill takes less than 2 seconds.     Comments: Dry skin was noted on bilateral hands over the fingertips.  Neurological:     Mental Status: She is alert and oriented to person, place, and time.  Psychiatric:        Behavior: Behavior normal.     Musculoskeletal Exam: She had discomfort range of motion of her cervical spine.  She had thoracic kyphosis.  She had discomfort with abduction of bilateral shoulders and tenderness over subacromial region.  She had painful internal rotation bilaterally.  Elbow joints, wrist joints, MCPs with good range of motion.  She had PIP and DIP thickening with no synovitis.  Hip joints with good range of motion.  Bilateral knee joints were replaced without any joint swelling or effusion.  There was no tenderness over ankles or MTPs.  CDAI Exam: CDAI Score: -- Patient Global: --; Provider Global: -- Swollen: 0 ; Tender: 0  Joint Exam 07/29/2021   No joint exam has been documented for this visit   There is currently no information documented on the homunculus. Go to the Rheumatology activity and complete the homunculus joint exam.  Investigation: No additional findings.  Imaging: CT ANGIO HEAD W OR WO CONTRAST  Result Date: 07/17/2021 CLINICAL DATA:  TIA (transient ischemic attack) G45.9 (ICD-10-CM). Stroke/TIA, assess intracranial arteries. Additional history provided by scanning technologist: Patient reports right lower extremity numbness, blurry vision, memory loss, difficulty speaking, right hand numbness 1 month ago. EXAM: CT ANGIOGRAPHY HEAD AND NECK TECHNIQUE: Multidetector CT  imaging of the head and neck was performed using the standard protocol during bolus administration of intravenous contrast. Multiplanar CT image reconstructions and MIPs were obtained to evaluate the vascular anatomy. Carotid stenosis measurements (when applicable) are obtained utilizing NASCET criteria, using the distal internal carotid diameter as the denominator. CONTRAST:  45mL ISOVUE-370 IOPAMIDOL (ISOVUE-370) INJECTION 76% COMPARISON:  Brain MRI 06/18/2021.  Head CT 06/18/2021.  FINDINGS: CT HEAD FINDINGS Brain: Cerebral volume is normal for age. Mild patchy and ill-defined hypoattenuation within the cerebral white matter, nonspecific but compatible with chronic small vessel ischemic disease. These findings are stable. There is no acute intracranial hemorrhage. No demarcated cortical infarct. No extra-axial fluid collection. No evidence of an intracranial mass. No midline shift. Vascular: No hyperdense vessel.  Atherosclerotic calcifications. Skull: Normal. Negative for fracture or focal lesion. Sinuses: Mild mucosal thickening within the right frontal sinus. Small mucous retention cysts within the bilateral maxillary sinuses. Orbits: No mass or acute finding. Review of the MIP images confirms the above findings CTA NECK FINDINGS Aortic arch: Standard aortic branching. The visualized aortic arch is normal in caliber. No hemodynamically significant innominate or proximal subclavian artery stenosis. Right carotid system: CCA and ICA patent within the neck without stenosis. Minimal calcified plaque within the carotid bifurcation. Left carotid system: CCA and ICA patent within the neck without stenosis. Minimal soft plaque within the carotid bifurcation. Vertebral arteries: Codominant and patent within the neck without stenosis. Skeleton: No acute bony abnormality or aggressive osseous lesion. Cervical spondylosis greatest at C6-C7 where there is advanced disc space narrowing, a posterior disc osteophyte complex  and uncovertebral hypertrophy. Other neck: No neck mass or cervical lymphadenopathy. Upper chest: No consolidation within the imaged lung apices. Small calcified granuloma within the right upper lobe. Biapical pleuroparenchymal scarring. Review of the MIP images confirms the above findings CTA HEAD FINDINGS Anterior circulation: The intracranial internal carotid arteries are patent. Sclerotic plaque within both vessels. No stenosis within the intracranial right ICA. Mild stenosis within the distal cavernous/paraclinoid left ICA The M1 middle cerebral arteries are patent. No M2 proximal branch occlusion or high-grade proximal stenosis is identified. The anterior cerebral arteries are patent. 1-2 mm inferiorly projecting vascular protrusion arising from the paraclinoid left ICA, which may reflect an aneurysm or infundibulum (series 12, image 113). 1-2 mm infundibulum arising from the supraclinoid right ICA (series 12, image 97). Posterior circulation: The intracranial vertebral arteries are patent. The basilar artery is patent. The posterior cerebral arteries are patent. Posterior communicating arteries are hypoplastic or absent bilaterally. Venous sinuses: Within the limitations of contrast timing, no convincing thrombus. Anatomic variants: As described Review of the MIP images confirms the above findings IMPRESSION: CT head: 1. No evidence of acute intracranial abnormality. 2. Mild chronic small vessel ischemic changes within the cerebral white matter, stable. 3. Paranasal sinus disease, as described. CTA neck: The common carotid, internal carotid and vertebral arteries are patent within the neck without stenosis. Minimal atherosclerotic plaque within the carotid bifurcations bilaterally. CTA head: 1. No intracranial large vessel occlusion or proximal high-grade arterial stenosis. 2. Atherosclerotic disease within the intracranial internal carotid arteries bilaterally. Mild atherosclerotic narrowing of the distal  cavernous/paraclinoid left ICA. 3. 1-2 mm inferiorly projecting vascular protrusion arising from the paraclinoid left ICA, which may reflect an aneurysm or infundibulum. Electronically Signed   By: Kellie Simmering DO   On: 07/17/2021 09:12   CT ANGIO NECK W OR WO CONTRAST  Result Date: 07/17/2021 CLINICAL DATA:  TIA (transient ischemic attack) G45.9 (ICD-10-CM). Stroke/TIA, assess intracranial arteries. Additional history provided by scanning technologist: Patient reports right lower extremity numbness, blurry vision, memory loss, difficulty speaking, right hand numbness 1 month ago. EXAM: CT ANGIOGRAPHY HEAD AND NECK TECHNIQUE: Multidetector CT imaging of the head and neck was performed using the standard protocol during bolus administration of intravenous contrast. Multiplanar CT image reconstructions and MIPs were obtained to evaluate the vascular anatomy. Carotid  stenosis measurements (when applicable) are obtained utilizing NASCET criteria, using the distal internal carotid diameter as the denominator. CONTRAST:  74mL ISOVUE-370 IOPAMIDOL (ISOVUE-370) INJECTION 76% COMPARISON:  Brain MRI 06/18/2021.  Head CT 06/18/2021. FINDINGS: CT HEAD FINDINGS Brain: Cerebral volume is normal for age. Mild patchy and ill-defined hypoattenuation within the cerebral white matter, nonspecific but compatible with chronic small vessel ischemic disease. These findings are stable. There is no acute intracranial hemorrhage. No demarcated cortical infarct. No extra-axial fluid collection. No evidence of an intracranial mass. No midline shift. Vascular: No hyperdense vessel.  Atherosclerotic calcifications. Skull: Normal. Negative for fracture or focal lesion. Sinuses: Mild mucosal thickening within the right frontal sinus. Small mucous retention cysts within the bilateral maxillary sinuses. Orbits: No mass or acute finding. Review of the MIP images confirms the above findings CTA NECK FINDINGS Aortic arch: Standard aortic branching.  The visualized aortic arch is normal in caliber. No hemodynamically significant innominate or proximal subclavian artery stenosis. Right carotid system: CCA and ICA patent within the neck without stenosis. Minimal calcified plaque within the carotid bifurcation. Left carotid system: CCA and ICA patent within the neck without stenosis. Minimal soft plaque within the carotid bifurcation. Vertebral arteries: Codominant and patent within the neck without stenosis. Skeleton: No acute bony abnormality or aggressive osseous lesion. Cervical spondylosis greatest at C6-C7 where there is advanced disc space narrowing, a posterior disc osteophyte complex and uncovertebral hypertrophy. Other neck: No neck mass or cervical lymphadenopathy. Upper chest: No consolidation within the imaged lung apices. Small calcified granuloma within the right upper lobe. Biapical pleuroparenchymal scarring. Review of the MIP images confirms the above findings CTA HEAD FINDINGS Anterior circulation: The intracranial internal carotid arteries are patent. Sclerotic plaque within both vessels. No stenosis within the intracranial right ICA. Mild stenosis within the distal cavernous/paraclinoid left ICA The M1 middle cerebral arteries are patent. No M2 proximal branch occlusion or high-grade proximal stenosis is identified. The anterior cerebral arteries are patent. 1-2 mm inferiorly projecting vascular protrusion arising from the paraclinoid left ICA, which may reflect an aneurysm or infundibulum (series 12, image 113). 1-2 mm infundibulum arising from the supraclinoid right ICA (series 12, image 97). Posterior circulation: The intracranial vertebral arteries are patent. The basilar artery is patent. The posterior cerebral arteries are patent. Posterior communicating arteries are hypoplastic or absent bilaterally. Venous sinuses: Within the limitations of contrast timing, no convincing thrombus. Anatomic variants: As described Review of the MIP images  confirms the above findings IMPRESSION: CT head: 1. No evidence of acute intracranial abnormality. 2. Mild chronic small vessel ischemic changes within the cerebral white matter, stable. 3. Paranasal sinus disease, as described. CTA neck: The common carotid, internal carotid and vertebral arteries are patent within the neck without stenosis. Minimal atherosclerotic plaque within the carotid bifurcations bilaterally. CTA head: 1. No intracranial large vessel occlusion or proximal high-grade arterial stenosis. 2. Atherosclerotic disease within the intracranial internal carotid arteries bilaterally. Mild atherosclerotic narrowing of the distal cavernous/paraclinoid left ICA. 3. 1-2 mm inferiorly projecting vascular protrusion arising from the paraclinoid left ICA, which may reflect an aneurysm or infundibulum. Electronically Signed   By: Kellie Simmering DO   On: 07/17/2021 09:12   TEMPORAL ARTERY  Result Date: 07/16/2021  TEMPORAL ARTERY REPORT Patient Name:  LISMARY KIEHN  Date of Exam:   07/16/2021 Medical Rec #: [id number]      Accession #:    [id number] Date of Birth: 04-11-1946       Patient Gender: F Patient Age:  74Y Exam [facility] Procedure:      VAS [place] TEMPORTAL ARTERY BILATERAL Referring Phys: [id number] [place] --------------------------------------------------------------------------------  Indications: Blurred vision and atypical migraine. High Risk Factors: Age > 28 yrs and female.  Comparison Study: No prior studies. Performing Technologist: Darlin Coco RDMS,RVT  Examination Guidelines: Patient in reclined position. 2D, color and spectral doppler sampling in the temporal artery along the hairline and temple in the longitudinal plane. 2D images along the hairline and temple in the transverse plane. Exam is bilateral.  Summary: Absence of a "halo" sign in the bilateral temporal artery, although not definitive, makes a diagnosis of temporal arteritis unlikely.  *See table(s) above for  measurements and observations.  Electronically signed by Antony Contras MD on 07/16/2021 at 12:41:05 PM.   Final     Recent Labs: Lab Results  Component Value Date   WBC 7.9 06/17/2021   HGB 15.6 (H) 06/17/2021   PLT 374 06/17/2021   NA 144 06/17/2021   K 3.8 06/17/2021   CL 108 06/17/2021   CO2 25 06/17/2021   GLUCOSE 105 (H) 06/17/2021   BUN 26 (H) 06/17/2021   CREATININE 0.60 06/17/2021   BILITOT 0.6 06/17/2021   ALKPHOS 56 06/17/2021   AST 24 06/17/2021   ALT 18 06/17/2021   PROT 7.5 06/17/2021   ALBUMIN 4.2 06/17/2021   CALCIUM 9.6 06/17/2021   GFRAA >60 03/28/2018    Speciality Comments: No specialty comments available.  Procedures:  No procedures performed Allergies: Other, Celexa [citalopram], Codeine, Metformin, Robaxin [methocarbamol], Crestor [rosuvastatin], and Simvastatin   Assessment / Plan:     Visit Diagnoses: Chronic pain of both shoulders - 05/29/21: Uric acid 6.4, ESR 24 - Plan: XR Shoulder Left, XR Shoulder Right, x-ray of bilateral shoulder joints showed mild degenerative changes in the right shoulder joint.  Left shoulder joint x-rays were unremarkable.  Her clinical findings are consistent with subacromial bursitis.  I will refer her to physical therapy.  If she has an adequate response to physical therapy we may consider cortisone injection in the future.  I have advised her to monitor blood glucose closely and we can make the decision of injecting her shoulder at the follow-up visit.  To complete the work-up I will obtain some labs today.  Ambulatory referral to Physical Therapy, Rheumatoid factor, Cyclic citrul peptide antibody, IgG  History of bilateral knee replacement - 2016 by Dr. Maureen Ralphs.  She had no swelling or effusion in her knee joints.  She denies discomfort in her knees.  Pain in both feet-she continues to have some discomfort in her feet.  She states she was seen by podiatrist and had calluses filed which helped.  DDD (degenerative disc disease),  cervical -she complains of pain and discomfort in her cervical region.  I reviewed x-rays of her cervical spine which were consistent with degenerative disc disease and facet joint arthropathy.  Plan: Ambulatory referral to Physical Therapy  Psoriasis-she has psoriasis on the fingertips.  She uses topical agents and has been followed by dermatologist in [place].  Myalgia -she complains of pain and discomfort in her bilateral arms.  I believe the discomfort is coming from subacromial bursitis.  Plan: CK  Osteopenia of multiple sites-I reviewed her DEXA scan per her request.  It was consistent with osteopenia.  Use of calcium, vitamin D and resistive exercises was discussed.  Postural kyphosis of thoracic region-stretching exercises were discussed.  Hypertension associated with diabetes (HCC)-blood pressure is elevated today.  Monitoring blood  pressure was discussed.  Other medical problems are listed as follows:  Controlled type 2 diabetes mellitus with other circulatory complication, without long-term current use of insulin (HCC)  Atherosclerosis of native coronary artery of native heart with stable angina pectoris ([place])  Hyperlipidemia associated with type 2 diabetes mellitus ([place])  Statin intolerance  History of TIA (transient ischemic attack)  Adult hypothyroidism  Vitamin D deficiency  MDD (major depressive disorder), recurrent episode, moderate (HCC)  GAD (generalized anxiety disorder)  Chronic insomnia  Orders: Orders Placed This Encounter  Procedures   XR Shoulder Left   XR Shoulder Right   CK   Rheumatoid factor   Cyclic citrul peptide antibody, IgG   Ambulatory referral to Physical Therapy    No orders of the defined types were placed in this encounter.    Follow-Up Instructions: Return for Osteoarthritis, Ps.   Bo Merino, MD  Note - This record has been created using Editor, commissioning.  Chart creation errors have been sought, but may not always   have been located. Such creation errors do not reflect on  the standard of medical care.

## 2021-07-16 ENCOUNTER — Ambulatory Visit
Admission: RE | Admit: 2021-07-16 | Discharge: 2021-07-16 | Disposition: A | Discharge status: Home / Self Care | Payer: Medicare Other | Source: Ambulatory Visit | Attending: Neurology | Admitting: Neurology

## 2021-07-16 ENCOUNTER — Encounter: Payer: Self-pay | Admitting: Podiatry

## 2021-07-16 ENCOUNTER — Ambulatory Visit (HOSPITAL_COMMUNITY)
Admission: RE | Admit: 2021-07-16 | Discharge: 2021-07-16 | Disposition: A | Discharge status: Home / Self Care | Payer: Medicare Other | Source: Ambulatory Visit | Attending: Neurology | Admitting: Neurology

## 2021-07-16 ENCOUNTER — Other Ambulatory Visit: Payer: Self-pay

## 2021-07-16 ENCOUNTER — Ambulatory Visit (INDEPENDENT_AMBULATORY_CARE_PROVIDER_SITE_OTHER): Payer: Medicare Other | Admitting: Podiatry

## 2021-07-16 DIAGNOSIS — Q828 Other specified congenital malformations of skin: Secondary | ICD-10-CM

## 2021-07-16 DIAGNOSIS — M79675 Pain in left toe(s): Secondary | ICD-10-CM

## 2021-07-16 DIAGNOSIS — I6523 Occlusion and stenosis of bilateral carotid arteries: Secondary | ICD-10-CM | POA: Diagnosis not present

## 2021-07-16 DIAGNOSIS — M79674 Pain in right toe(s): Secondary | ICD-10-CM

## 2021-07-16 DIAGNOSIS — H538 Other visual disturbances: Secondary | ICD-10-CM | POA: Diagnosis not present

## 2021-07-16 DIAGNOSIS — G43009 Migraine without aura, not intractable, without status migrainosus: Secondary | ICD-10-CM | POA: Diagnosis not present

## 2021-07-16 DIAGNOSIS — J341 Cyst and mucocele of nose and nasal sinus: Secondary | ICD-10-CM | POA: Diagnosis not present

## 2021-07-16 DIAGNOSIS — G459 Transient cerebral ischemic attack, unspecified: Secondary | ICD-10-CM

## 2021-07-16 DIAGNOSIS — J329 Chronic sinusitis, unspecified: Secondary | ICD-10-CM | POA: Diagnosis not present

## 2021-07-16 DIAGNOSIS — B351 Tinea unguium: Secondary | ICD-10-CM | POA: Diagnosis not present

## 2021-07-16 DIAGNOSIS — I672 Cerebral atherosclerosis: Secondary | ICD-10-CM | POA: Diagnosis not present

## 2021-07-16 DIAGNOSIS — Z8673 Personal history of transient ischemic attack (TIA), and cerebral infarction without residual deficits: Secondary | ICD-10-CM | POA: Diagnosis not present

## 2021-07-16 MED ORDER — IOPAMIDOL (ISOVUE-370) INJECTION 76%
75.0000 mL | Freq: Once | INTRAVENOUS | Status: AC | PRN
  Administered 2021-07-16: 75 mL via INTRAVENOUS

## 2021-07-16 NOTE — Progress Notes (Signed)
Subjective:  Patient ID: Renee [name], female    DOB: 07/11/1946,  MRN: [id number]  Chief Complaint  Patient presents with   Callouses    Bilateral callous     75 y.o. female presents with the above complaint.  Patient presents with complaint of bilateral fifth metatarsal hyperkeratotic lesion.  Patient stated they are still very painful but the debridement does help.  She also has secondary complaint thickened elongated dystrophic toenails x10.  Patient would like to have them debrided down.  She denies any other acute complaints.   Review of Systems: Negative except as noted in the HPI. Denies N/V/F/Ch.  Past Medical History:  Diagnosis Date   Allergy    Anxiety    Arthritis    CAD (coronary artery disease)    non-obstructive. cath 11/11   Diabetes mellitus    borderline diabetic - diet controlled    History of chicken pox    History of phobia    clastrophobia   Hyperlipidemia    Hypertension    Hypothyroidism    Kidney stones    hx of    Lichen sclerosus    Psoriasis    Stroke ([place])    Urinary tract infection    hx of     Current Outpatient Medications:    acetaminophen (TYLENOL) 650 MG CR tablet, Take 650 mg by mouth every 8 (eight) hours as needed for pain., Disp: , Rfl:    aspirin EC 81 MG tablet, Take by mouth daily., Disp: , Rfl:    Blood Glucose Monitoring Suppl (ONETOUCH VERIO) w/Device KIT, by Does not apply route. Use to check fasting blood sugar daily and an occasional 2 hr postprandial, Disp: , Rfl:    Carboxymethylcellulose Sodium (THERATEARS OP), Place 1 drop into both eyes at bedtime as needed., Disp: , Rfl:    clobetasol ointment (TEMOVATE) 8.45 %, Apply 1 application topically daily as needed (psoriasis flares). Please given in jars instead of tubes, Disp: 45 g, Rfl: 1   clobetasol ointment (TEMOVATE) 0.05 %, Apply to affected area every night for 4 weeks, then every other day for 4 weeks and then twice a week for 4 weeks or until resolution., Disp: 60  g, Rfl: 5   Dulaglutide (TRULICITY) 3.64 WO/0.3OZ SOPN, Inject into the skin once a week., Disp: , Rfl:    Emollient (CERAVE) CREA, Apply 1 application topically daily as needed (psoriasis)., Disp: , Rfl:    ezetimibe (ZETIA) 10 MG tablet, Take 1 tablet (10 mg total) by mouth daily., Disp: 90 tablet, Rfl: 3   famotidine (PEPCID) 20 MG tablet, Take 20 mg by mouth daily as needed for heartburn or indigestion., Disp: , Rfl:    Flaxseed, Linseed, (FLAX SEED OIL PO), Take 1 capsule by mouth daily., Disp: , Rfl:    fluticasone (FLONASE) 50 MCG/ACT nasal spray, Place 1 spray into both nostrils daily as needed for allergies., Disp: , Rfl:    glipiZIDE (GLUCOTROL XL) 5 MG 24 hr tablet, TAKE 1 TABLET BY MOUTH EVERY DAY WITH BREAKFAST (Patient taking differently: Take 5 mg by mouth daily with breakfast.), Disp: 90 tablet, Rfl: 1   glucose blood (ONETOUCH VERIO) test strip, Use to check fasting blood sugar daily and an occasional 2 hr postprandial., Disp: 100 each, Rfl: 11   ibuprofen (ADVIL,MOTRIN) 200 MG tablet, Take 600-800 mg by mouth every 6 (six) hours as needed for moderate pain., Disp: , Rfl:    Lancets (ONETOUCH ULTRASOFT) lancets, Use to check fasting blood sugar  daily and an occasional 2 hr postprandial, Disp: 100 each, Rfl: 11   levothyroxine (SYNTHROID) 75 MCG tablet, TAKE 1 TABLET DAILY (Patient taking differently: Take 75 mcg by mouth daily before breakfast.), Disp: 90 tablet, Rfl: 3   losartan (COZAAR) 50 MG tablet, Take 1 tablet (50 mg total) by mouth daily., Disp: 90 tablet, Rfl: 3   Multiple Vitamin (MULTIVITAMIN) tablet, Take 1 tablet by mouth every other day. , Disp: , Rfl:    nitroGLYCERIN (NITROSTAT) 0.4 MG SL tablet, Place 1 tablet (0.4 mg total) under the tongue every 5 (five) minutes as needed., Disp: 30 tablet, Rfl: 6   Skin Protectants, Misc. (EUCERIN) cream, Apply 1 application topically See admin instructions. Eczema relief applies to arms and hands daily, Disp: , Rfl:     triamcinolone cream (KENALOG) 0.1 %, Apply 1 application topically 2 (two) times daily as needed (psoriasis)., Disp: , Rfl:    Vitamin D, Cholecalciferol, 1000 units TABS, Take 2,000 Units by mouth daily., Disp: , Rfl:   Social History   Tobacco Use  Smoking Status Never  Smokeless Tobacco Never    Allergies  Allergen Reactions   Other Rash    Metal    Celexa [Citalopram] Other (See Comments)    Throat swelling and difficulty breathing   Codeine Nausea And Vomiting   Metformin Nausea And Vomiting   Robaxin [Methocarbamol] Nausea Only   Crestor [Rosuvastatin] Rash   Simvastatin Swelling and Rash   Objective:  There were no vitals filed for this visit. There is no height or weight on file to calculate BMI. Constitutional Well developed. Well nourished.  Vascular Dorsalis pedis pulses palpable bilaterally. Posterior tibial pulses palpable bilaterally. Capillary refill normal to all digits.  No cyanosis or clubbing noted. Pedal hair growth normal.  Neurologic Normal speech. Oriented to person, place, and time. Epicritic sensation to light touch grossly present bilaterally.  Dermatologic Nail Exam: Pt has thick disfigured discolored nails with subungual debris noted bilateral entire nail hallux through fifth toenails.  Pain on palpation to the nails. No open wounds. No skin lesions.  Orthopedic: Bilateral submetatarsal 5 porokeratotic/benign skin lesion with central nucleated core noted.  Upon debridement no pinpoint bleeding noted.  No wounds noted.   Radiographs:   3 views of skeletally mature adult bilateral foot: Midfoot arthritis noted.  Plantar and posterior heel spurring noted.  Plantarflexed fifth metatarsal noted with mild signs of tailor's bunion.  There is increase in lateral deviation angle. Assessment:   1. Porokeratosis   2. Pain due to onychomycosis of toenails of both feet     Plan:  Patient was evaluated and treated and all questions answered.  Bilateral  submetatarsal 5 porokeratosis x2 -X-ray to the patient the etiology of porokeratosis and was treatment options were extensively discussed.  Given the amount of pain she is having I believe she will benefit from debridement of the lesion followed by excision of the lesion.  I discussed this with the patient she states understanding like to proceed with that. -Using chisel blade and handle the lesion was debrided down to healthy striated tissue followed by excision of central nucleated core.  Immediate pain was noted after debridement and excision.  No pinpoint bleeding noted.  Onychomycosis with pain  -Nails palliatively debrided as below. -Educated on self-care  Procedure: Nail Debridement Rationale: pain  Type of Debridement: manual, sharp debridement. Instrumentation: Nail nipper, rotary burr. Number of Nails: 10  Procedures and Treatment: Consent by patient was obtained for treatment procedures. The  patient understood the discussion of treatment and procedures well. All questions were answered thoroughly reviewed. Debridement of mycotic and hypertrophic toenails, 1 through 5 bilateral and clearing of subungual debris. No ulceration, no infection noted.  Return Visit-Office Procedure: Patient instructed to return to the office for a follow up visit 3 months for continued evaluation and treatment.  Boneta Lucks, DPM    No follow-ups on file.     No follow-ups on file.

## 2021-07-23 ENCOUNTER — Telehealth: Payer: Self-pay

## 2021-07-23 NOTE — Telephone Encounter (Signed)
Pt's daughter called ( Tammy, ok per dpr) to review recent CTA of head/neck.  My chart message was received but just wanted to clarify findings.   I reviewed, and she verbalized understanding. Daughter asked for message to be sent to Dr. Leonie Man about increased forgetfulness noted with her mom. She sts she forgot to mention during the pt's appt on 06/25/21. She sts forgetfulness is more noticeable when completing common household chores and has been present now for the last 6-8 months.  Pt has scheduled f/u for 09/16/21 and will keep and further discuss at that time.

## 2021-07-23 NOTE — Telephone Encounter (Signed)
Per Dr. Leonie Man, pt will need new appt for memory concerns to be discussed I have change the Sept appt to a Consult visit to address this issue.  Will ask new pt records to call pcp and obtain any pertinent information for this issue.

## 2021-07-29 ENCOUNTER — Encounter: Payer: Self-pay | Admitting: Rheumatology

## 2021-07-29 ENCOUNTER — Ambulatory Visit: Payer: Self-pay

## 2021-07-29 ENCOUNTER — Ambulatory Visit (INDEPENDENT_AMBULATORY_CARE_PROVIDER_SITE_OTHER): Payer: Medicare Other | Admitting: Rheumatology

## 2021-07-29 ENCOUNTER — Other Ambulatory Visit: Payer: Self-pay

## 2021-07-29 VITALS — BP 163/92 | HR 66 | Ht 63.0 in | Wt 146.6 lb

## 2021-07-29 DIAGNOSIS — M25512 Pain in left shoulder: Secondary | ICD-10-CM | POA: Diagnosis not present

## 2021-07-29 DIAGNOSIS — E1159 Type 2 diabetes mellitus with other circulatory complications: Secondary | ICD-10-CM | POA: Diagnosis not present

## 2021-07-29 DIAGNOSIS — I25118 Atherosclerotic heart disease of native coronary artery with other forms of angina pectoris: Secondary | ICD-10-CM | POA: Diagnosis not present

## 2021-07-29 DIAGNOSIS — Z789 Other specified health status: Secondary | ICD-10-CM

## 2021-07-29 DIAGNOSIS — E1169 Type 2 diabetes mellitus with other specified complication: Secondary | ICD-10-CM | POA: Diagnosis not present

## 2021-07-29 DIAGNOSIS — E785 Hyperlipidemia, unspecified: Secondary | ICD-10-CM

## 2021-07-29 DIAGNOSIS — M8589 Other specified disorders of bone density and structure, multiple sites: Secondary | ICD-10-CM | POA: Diagnosis not present

## 2021-07-29 DIAGNOSIS — M25511 Pain in right shoulder: Secondary | ICD-10-CM | POA: Diagnosis not present

## 2021-07-29 DIAGNOSIS — M5030 Other cervical disc degeneration, unspecified cervical region: Secondary | ICD-10-CM | POA: Diagnosis not present

## 2021-07-29 DIAGNOSIS — F5104 Psychophysiologic insomnia: Secondary | ICD-10-CM

## 2021-07-29 DIAGNOSIS — Z8673 Personal history of transient ischemic attack (TIA), and cerebral infarction without residual deficits: Secondary | ICD-10-CM

## 2021-07-29 DIAGNOSIS — M791 Myalgia, unspecified site: Secondary | ICD-10-CM | POA: Diagnosis not present

## 2021-07-29 DIAGNOSIS — G8929 Other chronic pain: Secondary | ICD-10-CM | POA: Diagnosis not present

## 2021-07-29 DIAGNOSIS — I152 Hypertension secondary to endocrine disorders: Secondary | ICD-10-CM

## 2021-07-29 DIAGNOSIS — L409 Psoriasis, unspecified: Secondary | ICD-10-CM | POA: Diagnosis not present

## 2021-07-29 DIAGNOSIS — Z96653 Presence of artificial knee joint, bilateral: Secondary | ICD-10-CM | POA: Diagnosis not present

## 2021-07-29 DIAGNOSIS — M79672 Pain in left foot: Secondary | ICD-10-CM

## 2021-07-29 DIAGNOSIS — M79671 Pain in right foot: Secondary | ICD-10-CM | POA: Diagnosis not present

## 2021-07-29 DIAGNOSIS — M4004 Postural kyphosis, thoracic region: Secondary | ICD-10-CM

## 2021-07-29 DIAGNOSIS — F411 Generalized anxiety disorder: Secondary | ICD-10-CM

## 2021-07-29 DIAGNOSIS — E559 Vitamin D deficiency, unspecified: Secondary | ICD-10-CM

## 2021-07-29 DIAGNOSIS — E039 Hypothyroidism, unspecified: Secondary | ICD-10-CM

## 2021-07-29 DIAGNOSIS — F331 Major depressive disorder, recurrent, moderate: Secondary | ICD-10-CM

## 2021-07-29 NOTE — Patient Instructions (Signed)
Shoulder Exercises Ask your health care provider which exercises are safe for you. Do exercises exactly as told by your health care provider and adjust them as directed. It is normal to feel mild stretching, pulling, tightness, or discomfort as you do these exercises. Stop right away if you feel sudden pain or your pain gets worse. Do not begin these exercises until told by your health care provider. Stretching exercises External rotation and abduction This exercise is sometimes called corner stretch. This exercise rotates your arm outward (external rotation) and moves your arm out from your body (abduction). Stand in a doorway with one of your feet slightly in front of the other. This is called a staggered stance. If you cannot reach your forearms to the door frame, stand facing a corner of a room. Choose one of the following positions as told by your health care provider: Place your hands and forearms on the door frame above your head. Place your hands and forearms on the door frame at the height of your head. Place your hands on the door frame at the height of your elbows. Slowly move your weight onto your front foot until you feel a stretch across your chest and in the front of your shoulders. Keep your head and chest upright and keep your abdominal muscles tight. Hold for __________ seconds. To release the stretch, shift your weight to your back foot. Repeat __________ times. Complete this exercise __________ times a day. Extension, standing Stand and hold a broomstick, a cane, or a similar object behind your back. Your hands should be a little wider than shoulder width apart. Your palms should face away from your back. Keeping your elbows straight and your shoulder muscles relaxed, move the stick away from your body until you feel a stretch in your shoulders (extension). Avoid shrugging your shoulders while you move the stick. Keep your shoulder blades tucked down toward the middle of your  back. Hold for __________ seconds. Slowly return to the starting position. Repeat __________ times. Complete this exercise __________ times a day. Range-of-motion exercises Pendulum  Stand near a wall or a surface that you can hold onto for balance. Bend at the waist and let your left / right arm hang straight down. Use your other arm to support you. Keep your back straight and do not lock your knees. Relax your left / right arm and shoulder muscles, and move your hips and your trunk so your left / right arm swings freely. Your arm should swing because of the motion of your body, not because you are using your arm or shoulder muscles. Keep moving your hips and trunk so your arm swings in the following directions, as told by your health care provider: Side to side. Forward and backward. In clockwise and counterclockwise circles. Continue each motion for __________ seconds, or for as long as told by your health care provider. Slowly return to the starting position. Repeat __________ times. Complete this exercise __________ times a day. Shoulder flexion, standing  Stand and hold a broomstick, a cane, or a similar object. Place your hands a little more than shoulder width apart on the object. Your left / right hand should be palm up, and your other hand should be palm down. Keep your elbow straight and your shoulder muscles relaxed. Push the stick up with your healthy arm to raise your left / right arm in front of your body, and then over your head until you feel a stretch in your shoulder (flexion). Avoid   shrugging your shoulder while you raise your arm. Keep your shoulder blade tucked down toward the middle of your back. Hold for __________ seconds. Slowly return to the starting position. Repeat __________ times. Complete this exercise __________ times a day. Shoulder abduction, standing Stand and hold a broomstick, a cane, or a similar object. Place your hands a little more than shoulder  width apart on the object. Your left / right hand should be palm up, and your other hand should be palm down. Keep your elbow straight and your shoulder muscles relaxed. Push the object across your body toward your left / right side. Raise your left / right arm to the side of your body (abduction) until you feel a stretch in your shoulder. Do not raise your arm above shoulder height unless your health care provider tells you to do that. If directed, raise your arm over your head. Avoid shrugging your shoulder while you raise your arm. Keep your shoulder blade tucked down toward the middle of your back. Hold for __________ seconds. Slowly return to the starting position. Repeat __________ times. Complete this exercise __________ times a day. Internal rotation  Place your left / right hand behind your back, palm up. Use your other hand to dangle an exercise band, a towel, or a similar object over your shoulder. Grasp the band with your left / right hand so you are holding on to both ends. Gently pull up on the band until you feel a stretch in the front of your left / right shoulder. The movement of your arm toward the center of your body is called internal rotation. Avoid shrugging your shoulder while you raise your arm. Keep your shoulder blade tucked down toward the middle of your back. Hold for __________ seconds. Release the stretch by letting go of the band and lowering your hands. Repeat __________ times. Complete this exercise __________ times a day. Strengthening exercises External rotation  Sit in a stable chair without armrests. Secure an exercise band to a stable object at elbow height on your left / right side. Place a soft object, such as a folded towel or a small pillow, between your left / right upper arm and your body to move your elbow about 4 inches (10 cm) away from your side. Hold the end of the exercise band so it is tight and there is no slack. Keeping your elbow pressed  against the soft object, slowly move your forearm out, away from your abdomen (external rotation). Keep your body steady so only your forearm moves. Hold for __________ seconds. Slowly return to the starting position. Repeat __________ times. Complete this exercise __________ times a day. Shoulder abduction  Sit in a stable chair without armrests, or stand up. Hold a __________ weight in your left / right hand, or hold an exercise band with both hands. Start with your arms straight down and your left / right palm facing in, toward your body. Slowly lift your left / right hand out to your side (abduction). Do not lift your hand above shoulder height unless your health care provider tells you that this is safe. Keep your arms straight. Avoid shrugging your shoulder while you do this movement. Keep your shoulder blade tucked down toward the middle of your back. Hold for __________ seconds. Slowly lower your arm, and return to the starting position. Repeat __________ times. Complete this exercise __________ times a day. Shoulder extension Sit in a stable chair without armrests, or stand up. Secure an exercise band   to a stable object in front of you so it is at shoulder height. Hold one end of the exercise band in each hand. Your palms should face each other. Straighten your elbows and lift your hands up to shoulder height. Step back, away from the secured end of the exercise band, until the band is tight and there is no slack. Squeeze your shoulder blades together as you pull your hands down to the sides of your thighs (extension). Stop when your hands are straight down by your sides. Do not let your hands go behind your body. Hold for __________ seconds. Slowly return to the starting position. Repeat __________ times. Complete this exercise __________ times a day. Shoulder row Sit in a stable chair without armrests, or stand up. Secure an exercise band to a stable object in front of you so it  is at waist height. Hold one end of the exercise band in each hand. Position your palms so that your thumbs are facing the ceiling (neutral position). Bend each of your elbows to a 90-degree angle (right angle) and keep your upper arms at your sides. Step back until the band is tight and there is no slack. Slowly pull your elbows back behind you. Hold for __________ seconds. Slowly return to the starting position. Repeat __________ times. Complete this exercise __________ times a day. Shoulder press-ups  Sit in a stable chair that has armrests. Sit upright, with your feet flat on the floor. Put your hands on the armrests so your elbows are bent and your fingers are pointing forward. Your hands should be about even with the sides of your body. Push down on the armrests and use your arms to lift yourself off the chair. Straighten your elbows and lift yourself up as much as you comfortably can. Move your shoulder blades down, and avoid letting your shoulders move up toward your ears. Keep your feet on the ground. As you get stronger, your feet should support less of your body weight as you lift yourself up. Hold for __________ seconds. Slowly lower yourself back into the chair. Repeat __________ times. Complete this exercise __________ times a day. Wall push-ups  Stand so you are facing a stable wall. Your feet should be about one arm-length away from the wall. Lean forward and place your palms on the wall at shoulder height. Keep your feet flat on the floor as you bend your elbows and lean forward toward the wall. Hold for __________ seconds. Straighten your elbows to push yourself back to the starting position. Repeat __________ times. Complete this exercise __________ times a day. This information is not intended to replace advice given to you by your health care provider. Make sure you discuss any questions you have with your healthcare provider. Document Revised: 04/06/2019 Document  Reviewed: 01/12/2019 [employer] Patient Education  2022 [employer] Cervical Strain and Sprain Rehab Ask your health care provider which exercises are safe for you. Do exercises exactly as told by your health care provider and adjust them as directed. It is normal to feel mild stretching, pulling, tightness, or discomfort as you do these exercises. Stop right away if you feel sudden pain or your pain gets worse. Do not begin these exercises until told by your health care provider. Stretching and range-of-motion exercises Cervical side bending  Using good posture, sit on a stable chair or stand up. Without moving your shoulders, slowly tilt your left / right ear to your shoulder until you feel a stretch in the opposite   side neck muscles. You should be looking straight ahead. Hold for __________ seconds. Repeat with the other side of your neck. Repeat __________ times. Complete this exercise __________ times a day. Cervical rotation  Using good posture, sit on a stable chair or stand up. Slowly turn your head to the side as if you are looking over your left / right shoulder. Keep your eyes level with the ground. Stop when you feel a stretch along the side and the back of your neck. Hold for __________ seconds. Repeat this by turning to your other side. Repeat __________ times. Complete this exercise __________ times a day. Thoracic extension and pectoral stretch Roll a towel or a small blanket so it is about 4 inches (10 cm) in diameter. Lie down on your back on a firm surface. Put the towel lengthwise, under your spine in the middle of your back. It should not be under your shoulder blades. The towel should line up with your spine from your middle back to your lower back. Put your hands behind your head and let your elbows fall out to your sides. Hold for __________ seconds. Repeat __________ times. Complete this exercise __________ times a day. Strengthening exercises Isometric upper  cervical flexion Lie on your back with a thin pillow behind your head and a small rolled-up towel under your neck. Gently tuck your chin toward your chest and nod your head down to look toward your feet. Do not lift your head off the pillow. Hold for __________ seconds. Release the tension slowly. Relax your neck muscles completely before you repeat this exercise. Repeat __________ times. Complete this exercise __________ times a day. Isometric cervical extension  Stand about 6 inches (15 cm) away from a wall, with your back facing the wall. Place a soft object, about 6-8 inches (15-20 cm) in diameter, between the back of your head and the wall. A soft object could be a small pillow, a ball, or a folded towel. Gently tilt your head back and press into the soft object. Keep your jaw and forehead relaxed. Hold for __________ seconds. Release the tension slowly. Relax your neck muscles completely before you repeat this exercise. Repeat __________ times. Complete this exercise __________ times a day. Posture and body mechanics Body mechanics refers to the movements and positions of your body while you do your daily activities. Posture is part of body mechanics. Good posture and healthy body mechanics can help to relieve stress in your body's tissues and joints. Good posture means that your spine is in its natural S-curve position (your spine is neutral), your shoulders are pulled back slightly, and your head is not tipped forward. The following are general guidelines for applying improved posture andbody mechanics to your everyday activities. Sitting  When sitting, keep your spine neutral and keep your feet flat on the floor. Use a footrest, if necessary, and keep your thighs parallel to the floor. Avoid rounding your shoulders, and avoid tilting your head forward. When working at a desk or a computer, keep your desk at a height where your hands are slightly lower than your elbows. Slide your chair  under your desk so you are close enough to maintain good posture. When working at a computer, place your monitor at a height where you are looking straight ahead and you do not have to tilt your head forward or downward to look at the screen.  Standing  When standing, keep your spine neutral and keep your feet about hip-width apart. Keep a   slight bend in your knees. Your ears, shoulders, and hips should line up. When you do a task in which you stand in one place for a long time, place one foot up on a stable object that is 2-4 inches (5-10 cm) high, such as a footstool. This helps keep your spine neutral.  Resting When lying down and resting, avoid positions that are most painful for you. Try to support your neck in a neutral position. You can use a contour pillow or asmall rolled-up towel. Your pillow should support your neck but not push on it. This information is not intended to replace advice given to you by your health care provider. Make sure you discuss any questions you have with your healthcare provider. Document Revised: 04/04/2019 Document Reviewed: 09/13/2018 [employer] Patient Education  2022 [employer]  

## 2021-07-30 LAB — CYCLIC CITRUL PEPTIDE ANTIBODY, IGG: Cyclic Citrullin Peptide Ab: <16 UNITS

## 2021-07-30 LAB — RHEUMATOID FACTOR: Rheumatoid fact SerPl-aCnc: <14 IU/mL (ref <14)

## 2021-07-30 LAB — CK: Total CK: 60 U/L (ref 29–143)

## 2021-07-30 NOTE — Progress Notes (Signed)
CK is normal, rheumatoid factor and anti-CCP are negative.  I will discuss results at the follow-up visit.

## 2021-08-06 ENCOUNTER — Other Ambulatory Visit: Payer: Self-pay

## 2021-08-06 ENCOUNTER — Ambulatory Visit: Payer: Medicare Other | Attending: Rheumatology

## 2021-08-06 ENCOUNTER — Telehealth: Payer: Self-pay

## 2021-08-06 DIAGNOSIS — M6281 Muscle weakness (generalized): Secondary | ICD-10-CM | POA: Insufficient documentation

## 2021-08-06 DIAGNOSIS — G8929 Other chronic pain: Secondary | ICD-10-CM | POA: Insufficient documentation

## 2021-08-06 DIAGNOSIS — M25511 Pain in right shoulder: Secondary | ICD-10-CM | POA: Insufficient documentation

## 2021-08-06 DIAGNOSIS — M25512 Pain in left shoulder: Secondary | ICD-10-CM | POA: Diagnosis not present

## 2021-08-06 NOTE — Chronic Care Management (AMB) (Addendum)
Chronic Care Management Pharmacy Assistant   Name: Renee [name]  MRN: [id number] DOB: 18-Nov-1946  Reason for Encounter: Diabetes Review  Recent office visits:  07/10/21 - Dr. Diona Browner, PCP - Pt presented for diabetes follow up. She started Trulicity 2 weeks ago. Improving control. Will continue Trulicity with plan to discontinue glipizide if BG < 80 or symptomatic.   Recent consult visits:  07/29/21 - Rheumatology - Patient presented for bilateral shoulder pain. Labs ordered, X-rays. Referral to physical therapy. No medication changes. 07/16/21 - Podiatry  - Patient presented for bilateral callous debridement. No medication changes. 6/30 - Neurology - Pt presented for hospital follow up, posterior circulation TIA versus atypical migraine Recommend CT angiogram of the brain and neck, ESR and temporal artery ultrasound.  Continue aspirin 81 mg daily strict control of hypertension with blood pressure goal below 130/90, lipids with LDL cholesterol goal below 70 mg percent and diabetes with hemoglobin A1c goal below 6.5%.  Follow-up in 2 months.  Hospital visits:  None in previous 6 months  Medications: Outpatient Encounter Medications as of 08/06/2021  Medication Sig Note   acetaminophen (TYLENOL) 650 MG CR tablet Take 650 mg by mouth every 8 (eight) hours as needed for pain.    aspirin EC 81 MG tablet Take by mouth daily.    Blood Glucose Monitoring Suppl (ONETOUCH VERIO) w/Device KIT by Does not apply route. Use to check fasting blood sugar daily and an occasional 2 hr postprandial    Carboxymethylcellulose Sodium (THERATEARS OP) Place 1 drop into both eyes at bedtime as needed.    clobetasol ointment (TEMOVATE) 5.94 % Apply 1 application topically daily as needed (psoriasis flares). Please given in jars instead of tubes    clobetasol ointment (TEMOVATE) 0.05 % Apply to affected area every night for 4 weeks, then every other day for 4 weeks and then twice a week for 4 weeks or until resolution.  (Patient not taking: Reported on 07/29/2021) 06/18/2021: Pt finished the bedtime x 4 weeks 06/17/21   Dulaglutide (TRULICITY) 7.07 AJ/5.1ID SOPN Inject into the skin once a week.    Emollient (CERAVE) CREA Apply 1 application topically daily as needed (psoriasis). 11/17/2016: Mix with triamcinolone   ezetimibe (ZETIA) 10 MG tablet Take 1 tablet (10 mg total) by mouth daily.    famotidine (PEPCID) 20 MG tablet Take 20 mg by mouth daily as needed for heartburn or indigestion.    Flaxseed, Linseed, (FLAX SEED OIL PO) Take 1 capsule by mouth daily.    fluticasone (FLONASE) 50 MCG/ACT nasal spray Place 1 spray into both nostrils daily as needed for allergies.    glipiZIDE (GLUCOTROL XL) 5 MG 24 hr tablet TAKE 1 TABLET BY MOUTH EVERY DAY WITH BREAKFAST (Patient taking differently: Take 5 mg by mouth daily with breakfast.)    glucose blood (ONETOUCH VERIO) test strip Use to check fasting blood sugar daily and an occasional 2 hr postprandial.    ibuprofen (ADVIL,MOTRIN) 200 MG tablet Take 600-800 mg by mouth every 6 (six) hours as needed for moderate pain.    Lancets (ONETOUCH ULTRASOFT) lancets Use to check fasting blood sugar daily and an occasional 2 hr postprandial    levothyroxine (SYNTHROID) 75 MCG tablet TAKE 1 TABLET DAILY (Patient taking differently: Take 75 mcg by mouth daily before breakfast.)    losartan (COZAAR) 50 MG tablet Take 1 tablet (50 mg total) by mouth daily.    Multiple Vitamin (MULTIVITAMIN) tablet Take 1 tablet by mouth every other day.  nitroGLYCERIN (NITROSTAT) 0.4 MG SL tablet Place 1 tablet (0.4 mg total) under the tongue every 5 (five) minutes as needed. (Patient not taking: Reported on 07/29/2021)    Skin Protectants, Misc. (EUCERIN) cream Apply 1 application topically See admin instructions. Eczema relief applies to arms and hands daily    triamcinolone cream (KENALOG) 0.1 % Apply 1 application topically 2 (two) times daily as needed (psoriasis).    Vitamin D, Cholecalciferol,  1000 units TABS Take 2,000 Units by mouth daily.    No facility-administered encounter medications on file as of 08/06/2021.      Recent Relevant Labs: Lab Results  Component Value Date/Time   HGBA1C 7.3 (H) 04/27/2021 10:41 AM   HGBA1C 7.0 (A) 01/15/2021 11:21 AM   HGBA1C 7.9 (H) 10/07/2020 12:20 PM    Kidney Function Lab Results  Component Value Date/Time   CREATININE 0.60 06/17/2021 07:33 PM   CREATININE 0.72 06/17/2021 07:08 PM   GFR 82.46 04/27/2021 10:41 AM   GFRNONAA >60 06/17/2021 07:08 PM   GFRAA >60 03/28/2018 07:21 AM    Contacted patient on 08/07/2021 to discuss diabetes disease state.   Current antihyperglycemic regimen:  Trulicity 7.34 mg - Injects once weekly on Sunday morning Glipizide 5 mg - Take 1 tablet daily in morning  Patient verbally confirms she is taking the above medications as directed. Yes  What diet changes have been made to improve diabetes control? The patient reports eating fresh vegetables and lean meats.  What recent interventions/DTPs have been made to improve glycemic control:   07/10/21- PCP- May be able to drop the glipizide if good control of CBG's  Have there been any recent hospitalizations or ED visits since last visit with CPP? No  Patient denies hypoglycemic symptoms, including Pale, Sweaty, Shaky, Hungry, Nervous/irritable, and Vision changes  Patient denies hyperglycemic symptoms, including blurry vision, excessive thirst, fatigue, polyuria, and weakness  How often are you checking your blood sugar? in the morning before eating or drinking  What are your blood sugars ranging?  Fasting: 07/27/21 -126 07/28/21 -108 07/29/21 -128      07/30/21 -145 07/31/21-117 08/01/21-116     08/02/21-118 08/03/21-119 08/04/21-121    08/05/21-99 08/06/21-102 08/07/21-112  During the week, how often does your blood glucose drop below 70? Never  Are you checking your feet daily/regularly? Yes  Adherence Review: Is the patient currently on a STATIN medication?  No - on Zetia Is the patient currently on ACE/ARB medication? Yes Does the patient have >5 day gap between last estimated fill dates? No  Care Gaps: Last annual wellness visit: 10/14/2020 Last eye exam / retinopathy screening: up to date Last diabetic foot exam: 07/16/2021  Counseled patient on importance of annual eye and foot exam. The patient reports she has yearly eye exams and her feet get checked regularly.  Star Rating Drugs:  Medication:  Last Fill: Day Supply Glipizide 24m  51/93/7990 Trulicity  (PAP) Losartan 594m7/19/22 90  PCP appointment on 08/21/21  MiDebbora DusCPP notified  VeAvel SensorCMRosendale Hamletssistant [phone] have reviewed the care management and care coordination activities outlined in this encounter and I am certifying that I agree with the content of this note. No further action required.  MiDebbora DusPharmD Clinical Pharmacist [phone]

## 2021-08-06 NOTE — Therapy (Addendum)
[place] [facility] [address]. [place], [place], [address] Phone: [phone]   Fax:  [phone]   Physical Therapy Evaluation  Patient Details  Name: Renee [name] MRN: [id number] Date of Birth: 28-Jun-1946 Referring Provider (PT): Bo Merino  Encounter Date: 08/06/2021   PT End of Session - 08/09/21 1540     Visit Number 1    Number of Visits 17    Date for PT Re-Evaluation 10/01/21    Authorization Type eval: 08/06/21    PT Start Time 1530    PT Stop Time 1615    PT Time Calculation (min) 45 min    Activity Tolerance Patient tolerated treatment well    Behavior During Therapy [facility] for tasks assessed/performed             Past Medical History:  Diagnosis Date   Allergy    Anxiety    Arthritis    CAD (coronary artery disease)    non-obstructive. cath 11/11   Diabetes mellitus    borderline diabetic - diet controlled    History of chicken pox    History of phobia    clastrophobia   Hyperlipidemia    Hypertension    Hypothyroidism    Kidney stones    hx of    Lichen sclerosus    Psoriasis    Stroke ([place])    Urinary tract infection    hx of     Past Surgical History:  Procedure Laterality Date   APPENDECTOMY     CARDIAC CATHETERIZATION     2011   CARPAL TUNNEL RELEASE     Left trigger finger   CATARACT EXTRACTION W/PHACO Left 06/06/2018   Procedure: CATARACT EXTRACTION PHACO AND INTRAOCULAR LENS PLACEMENT ([place]);  Surgeon: Birder Robson, MD;  Location: [facility] ORS;  Service: Ophthalmology;  Laterality: Left;  [place]  00:23 AP% 16.0 CDE 3.69 Fluid pack lot # [id number]   CATARACT EXTRACTION W/PHACO Right 09/19/2018   Procedure: CATARACT EXTRACTION PHACO AND INTRAOCULAR LENS PLACEMENT (IOC);  Surgeon: Birder Robson, MD;  Location: [facility] ORS;  Service: Ophthalmology;  Laterality: Right;  [place] 00:29.5 AP% 17.0 CDE$ 5.03 Fluid pack lot # [id number]   COLONOSCOPY WITH PROPOFOL N/A 05/13/2017   Procedure: COLONOSCOPY  WITH PROPOFOL;  Surgeon: Manya Silvas, MD;  Location: [facility] ENDOSCOPY;  Service: Endoscopy;  Laterality: N/A;   ESOPHAGOGASTRODUODENOSCOPY (EGD) WITH PROPOFOL N/A 01/04/2020   Procedure: ESOPHAGOGASTRODUODENOSCOPY (EGD) WITH PROPOFOL;  Surgeon: [name], MD;  Location: [facility] ENDOSCOPY;  Service: Gastroenterology;  Laterality: N/A;   EYE SURGERY  2004   lasik - right eye    JOINT REPLACEMENT     KNEE SURGERY Right 2015   arthroscopy   TOTAL KNEE ARTHROPLASTY Bilateral 06/12/2015   Procedure: TOTAL KNEE BILATERAL;  Surgeon: Gaynelle Arabian, MD;  Location: [facility] ORS;  Service: Orthopedics;  Laterality: Bilateral;  and epidural   TUBAL LIGATION     Tubes tied     and untied. and tied again    There were no vitals filed for this visit.    Subjective Assessment - 08/09/21 1525     Subjective Bilateral shoulder pain    Pertinent History Pt reports that she has a history of bilateral shoulder/arm pain for the last 2.5 years. No history of trauma. "The pain occurs in the muscles." She struggles with pain when lifting her arms. Pain occasionally wakes her up at night. She also complains of occasional neck pain and reports that she was previously diagnosed  with occipital neuralgia. No back pain and pt denies pain in other joints. Denies any redness, swelling, or warmth in shoulders. She reports that she has tried OTC pain relievers and Voltaren gel without significant relief. Per medical record plain film radiographs of shoulders show mild degenerative changes in the R shoulder and no findings in L shoulder. Cervical radiographs showed DDD and facet joint arthropathy. MD diagnosed her with subacromial bursitis and referred her for physical therapy. Return appointment with MD is in September. Years ago she started taking a cholesterol medication and had full body aches. She stopped and her pain resolved so now she is on a non-statin cholesterol medication. PMH includes psoriasis, DM, and bilateral TKR (2016).     Currently in Pain? Yes   See history               [facility] PT Assessment - 08/09/21 1538       Assessment   Medical Diagnosis Shoulder pain    Referring Provider (PT) Abel Presto Deveshwar    Onset Date/Surgical Date 08/06/18   Approximate   Hand Dominance Left    Next MD Visit September 2022    Prior Therapy None for this issue      Precautions   Precautions None      Restrictions   Weight Bearing Restrictions No                      SUBJECTIVE Chief complaint: Bilateral shoulder pain Onset: Pt reports that she has a history of bilateral shoulder/arm pain for the last 2.5 years. No history of trauma. "The pain occurs in the muscles." She struggles with pain when lifting her arms. Pain occasionally wakes her up at night. She also complains of occasional neck pain and reports that she was previously diagnosed with occipital neuralgia. No back pain and pt denies pain in other joints. Denies any redness, swelling, or warmth in shoulders. She reports that she has tried OTC pain relievers and Voltaren gel without significant relief. Per medical record plain film radiographs of shoulders show mild degenerative changes in the R shoulder and no findings in L shoulder. Cervical radiographs showed DDD and facet joint arthropathy. MD diagnosed her with subacromial bursitis and referred her for physical therapy. Return appointment with MD is in September. Years ago she started taking a cholesterol medication and had full body aches. She stopped and her pain resolved so now she is on a non-statin cholesterol medication. PMH includes psoriasis, DM, and bilateral TKR (2016).    Shoulder trauma: No  Pain quality: throbbing Pain: 6/10 Present, 6/10 Best, 9/10 Worst Aggravating factors: reaching, hurts at rest Easing factors: "sitting in the sun," repositioning, Tylenol and Advil 24 hour pain behavior: no change, pain wakes her up  Radiating pain: Yes Numbness/Tingling: Yes, bilateral  hands;  Prior history of shoulder injury or pain: No Prior history of therapy for shoulder: No Follow-up appointment with MD: Yes, September but pt doesn't recall the date Dominant hand: Left   OBJECTIVE  MUSCULOSKELETAL: Tremor: Normal Bulk: Normal Tone: Normal  Cervical Screen AROM: Lateral flexin: 25 degrees bilateral with pain, flexion: 65, painless, extension: 25 painless; Spurlings A (ipsilateral lateral flexion/axial compression): R: Positive L: Positive Spurlings B (ipsilateral lateral flexion/contralateral rotation/axial compression): R: Negative L: Negative Repeated movement: No centralization or peripheralization with protraction or retraction Hoffman Sign (cervical cord compression): R: Negative L: Negative ULTT Median: R: Not examined L: Not examined ULTT Ulnar: R: Not examined L: Not examined ULTT  Radial: R: Not examined L: Not examined  Elbow Screen Elbow AROM: Within Normal Limits  Palpation Pain to palpation along bilateral upper arms in biceps, triceps, and ant/post/lateral deltoids. Pain in bilateral upper traps;  Strength R/L 4+*/4+* Shoulder flexion (anterior deltoid/pec major/coracobrachialis, axillary n. (C5-6) and musculocutaneous n. (C5-7), pain is worse on the L 4+*/4+*Shoulder abduction (deltoid/supraspinatus, axillary/suprascapular n, C5) 4*/4* Shoulder external rotation (infraspinatus/teres minor) 5*/4* Shoulder internal rotation (subcapularis/lats/pec major) 5/5 Elbow flexion (biceps brachii, brachialis, brachioradialis, musculoskeletal n, C5-6), sitting 5*/5 Elbow extension (triceps, radial n, C7), sitting 5/5 Wrist Extension 5/5 Wrist Flexion 5/5 Finger adduction (interossei, ulnar n, T1)  AROM R/L 137*/147* Shoulder flexion 110*/125* Shoulder abduction 45*/80 Shoulder external rotation 60*/72 Shoulder internal rotation *Indicates pain, overpressure performed unless otherwise indicated  NEUROLOGICAL:  Mental Status Patient is  oriented to person, place and time.  Recent memory is intact.  Remote memory is intact.  Attention span and concentration are intact.  Expressive speech is intact.  Patient's fund of knowledge is within normal limits for educational level.  Cranial Nerves Deferred  Sensation Deferred  Reflexes R/L 2+/2+ Biceps 2+/2+ Brachioradialis 0/1+ Triceps  Coordination/Cerebellar Deferred  SPECIAL TESTS Deferred         Objective measurements completed on examination: See above findings.               PT Education - 08/09/21 1539     Education Details Plan of care    Person(s) Educated Patient    Methods Explanation    Comprehension Verbalized understanding              PT Short Term Goals - 08/09/21 1552       PT SHORT TERM GOAL #1   Title Pt will be independent with HEP in order to improve shoulder range of motion and strength as well as decrease pain in order to improve pain-free function at home.    Baseline --    Time 4    Period Weeks    Status New    Target Date 09/03/21               PT Long Term Goals - 08/09/21 1553       PT LONG TERM GOAL #1   Title Pt will decrease worst bilateral shoulder pain as reported on NPRS by at least 3 points in order to demonstrate clinically significant reduction in shoulder pain.    Baseline 08/06/21: Worst: 9/10    Time 8    Period Weeks    Status New    Target Date 10/01/21      PT LONG TERM GOAL #2   Title Pt will increase FOTO to at 62 least in order to demonstrate significant improvement in function related to bilateral shoulder pain    Baseline 08/06/21: 48    Time 8    Period Weeks    Status New    Target Date 10/01/21      PT LONG TERM GOAL #3   Title Pt will report at least 50% improvement in her symptoms in order to improve pain-free function at home    Time 8    Period Weeks    Status New    Target Date 10/01/21                    Plan - 08/09/21 1540     Clinical  Impression Statement Pt is a pleasant 75 year-old referred for bilateral shoulder pain. She is painful to  palpation along bilateral upper arms in her biceps, triceps, and ant/post/lateral deltoids. She is also painful to palpation in her bilateral upper traps. Pt has poor sitting and standing posture with upper thoracic kyphosis resulting in a forward head and rounded shoulders. Limited and painful active bilateral shoulder flexion and abduction as well as significant pain with resisted strength testing of both shoulders. Right shoulder is more impaired than left. Cervical AROM is also limited and painful with positive Spurlings A Test bilaterally. Additional testing will be performed at first follow-up visit and HEP will be initiated. Pt will benefit from PT services to address deficits in strength, range of motion, and pain in order to return to full function at home with less pain.    Personal Factors and Comorbidities Age;Comorbidity 3+;Past/Current Experience;Time since onset of injury/illness/exacerbation    Comorbidities psoriasis, anxiety, DM    Examination-Activity Limitations Reach Overhead    Examination-Participation Restrictions Meal Prep;Laundry;Cleaning;Shop    Stability/Clinical Decision Making Evolving/Moderate complexity    Clinical Decision Making Moderate    Rehab Potential Good    PT Frequency 2x / week    PT Duration 8 weeks    PT Treatment/Interventions ADLs/Self Care Home Management;Aquatic Therapy;Biofeedback;Canalith Repostioning;Cryotherapy;Electrical Stimulation;Iontophoresis '4mg'$ /ml Dexamethasone;Moist Heat;Traction;Ultrasound;Gait training;Functional mobility training;Therapeutic activities;Therapeutic exercise;Balance training;Neuromuscular re-education;Manual techniques;Dry needling;Vestibular;Spinal Manipulations;Joint Manipulations;Passive range of motion    PT Next Visit Plan special testing of bilateral shoulders, cervical rotation AROM, initiate manual therapy and  strengthening for shoulders    PT Home Exercise Plan None currently    Consulted and Agree with Plan of Care Patient             Patient will benefit from skilled therapeutic intervention in order to improve the following deficits and impairments:  Pain, Decreased range of motion, Decreased strength  Visit Diagnosis: Chronic left shoulder pain  Chronic right shoulder pain  Muscle weakness (generalized)     Problem List Patient Active Problem List   Diagnosis Date Noted   History of TIA (transient ischemic attack) 06/26/2021   Statin intolerance 01/15/2021   Myalgia 12/22/2020   MDD (major depressive disorder), recurrent episode, moderate (HCC) 07/08/2020   Chronic insomnia 07/08/2020   Chronic diarrhea 03/30/2018   GAD (generalized anxiety disorder) 01/31/2018   Osteopenia 12/08/2017   Vitamin D deficiency 03/04/2016   Allergic rhinitis 03/04/2016   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 [place], [place], [address] Phone: [phone]   Fax:  [phone]  Name: Renee [name] MRN: [id number] Date of Birth: 1946-11-10

## 2021-08-09 NOTE — Addendum Note (Signed)
Addended by: Roxana Hires D on: 08/09/2021 04:01 PM   Modules accepted: Orders

## 2021-08-10 NOTE — Progress Notes (Deleted)
Office Visit Note  [name]             Date of Birth: 1946-05-31           MRN: [id number]             PCP: Jinny Sanders, MD Referring: Jinny Sanders, MD Visit Date: 08/20/2021 Occupation: _0 @  Subjective:  No chief complaint on file.   History of Present Illness: Renee [name] is a 75 y.o. female ***   Activities of Daily Living:  Patient reports morning stiffness for *** {minute/hour:19697}.   Patient {ACTIONS;DENIES/REPORTS:21021675::"Denies"} nocturnal pain.  Difficulty dressing/grooming: {ACTIONS;DENIES/REPORTS:21021675::"Denies"} Difficulty climbing stairs: {ACTIONS;DENIES/REPORTS:21021675::"Denies"} Difficulty getting out of chair: {ACTIONS;DENIES/REPORTS:21021675::"Denies"} Difficulty using hands for taps, buttons, cutlery, and/or writing: {ACTIONS;DENIES/REPORTS:21021675::"Denies"}  No Rheumatology ROS completed.   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 cath 11/11   Diabetes  mellitus    borderline diabetic - diet controlled    History of chicken pox    History of phobia    clastrophobia   Hyperlipidemia    Hypertension    Hypothyroidism    Kidney stones    hx of    Lichen sclerosus    Psoriasis    Stroke ([place])    Urinary tract infection    hx of     Family History  Problem Relation Age of Onset   COPD Mother    Diabetes Mother    Hypertension Mother    Hypertension Father    Diabetes Sister    Hypertension Sister    Diabetes Brother    Arthritis Brother    Heart disease Brother    Arthritis Brother    Diabetes Brother    Heart disease Brother    Stomach cancer Maternal Grandfather    Stroke Maternal Grandfather    Breast cancer Daughter 56       Double mastectomy   Past Surgical History:  Procedure Laterality Date   APPENDECTOMY     CARDIAC CATHETERIZATION     2011   CARPAL TUNNEL RELEASE     Left trigger finger   CATARACT EXTRACTION W/PHACO Left 06/06/2018   Procedure: CATARACT EXTRACTION PHACO AND INTRAOCULAR LENS PLACEMENT ([place]);  Surgeon: Birder Robson, MD;  Location: [facility] ORS;  Service: Ophthalmology;  Laterality: Left;  [place]  00:23 AP% 16.0 CDE 3.69 Fluid pack lot # [id number]   CATARACT EXTRACTION W/PHACO Right 09/19/2018   Procedure: CATARACT EXTRACTION PHACO AND INTRAOCULAR LENS PLACEMENT ([facility]);  Surgeon: Birder Robson, MD;  Location: [facility] ORS;  Service: Ophthalmology;  Laterality: Right;  [place] 00:29.5 AP% 17.0 CDE$ 5.03 Fluid pack lot # [id number]   COLONOSCOPY WITH PROPOFOL N/A 05/13/2017   Procedure: COLONOSCOPY WITH PROPOFOL;  Surgeon: Manya Silvas, MD;  Location: [facility] ENDOSCOPY;  Service: Endoscopy;  Laterality: N/A;   ESOPHAGOGASTRODUODENOSCOPY (EGD) WITH PROPOFOL N/A 01/04/2020   Procedure: ESOPHAGOGASTRODUODENOSCOPY (EGD) WITH PROPOFOL;  Surgeon: [name], MD;  Location[facility]COPY;  Service: Gastroenterology;  Laterality: N/A;   EYE SURGERY  2004   lasik - right eye    JOINT REPLACEMENT     KNEE SURGERY  Right 2015   arthroscopy   TOTAL KNEE ARTHROPLASTY Bilateral 06/12/2015   Procedure: TOTAL KNEE BILATERAL;  Surgeon: Gaynelle Arabian, MD;  Location: [facility] ORS;  Service: Orthopedics;  Laterality: Bilateral;  and epidural   TUBAL LIGATION     Tubes tied     and untied. and tied again   Social History   Social History Narrative   Lives alone   Right Handed   Drink 1 cups caffeine daily   Immunization History  Administered Date(s) Administered   H1N1 11/02/2008   Influenza Split 10/22/2006, 10/28/2007, 09/26/2008, 09/13/2009, 09/12/2010, 09/18/2011, 09/27/2012   Influenza, High Dose Seasonal PF 09/11/2016, 08/28/2019, 09/22/2020   Influenza,inj,Quad PF,6+ Mos 09/13/2013, 09/13/2014, 09/20/2017, 09/25/2018   Influenza-Unspecified 08/27/2014, 09/18/2015   PFIZER(Purple Top)SARS-COV-2 Vaccination 01/03/2020, 01/26/2020, 11/05/2020   Pneumococcal Conjugate-13 04/05/2014   Pneumococcal Polysaccharide-23 09/26/2008, 02/08/2017   Tdap 09/27/2012     Objective: Vital Signs: There were no vitals taken for this visit.   Physical Exam   Musculoskeletal Exam: ***  CDAI Exam: CDAI Score: -- Patient Global: --; Provider Global: -- Swollen: --; Tender: -- Joint Exam 08/20/2021   No joint exam has been documented for this visit   There is currently no information documented on the homunculus. Go to the Rheumatology activity and complete the homunculus joint exam.  Investigation: No additional findings.  Imaging: CT ANGIO HEAD W OR WO CONTRAST  Result Date: 07/17/2021 CLINICAL DATA:  TIA (transient ischemic attack) G45.9 (ICD-10-CM). Stroke/TIA, assess intracranial arteries. Additional history provided by scanning technologist: Patient reports right lower extremity numbness, blurry vision, memory loss, difficulty speaking, right hand numbness 1 month ago. EXAM: CT ANGIOGRAPHY HEAD AND NECK TECHNIQUE: Multidetector CT imaging of the head and neck was performed using the standard protocol  during bolus administration of intravenous contrast. Multiplanar CT image reconstructions and MIPs were obtained to evaluate the vascular anatomy. Carotid stenosis measurements (when applicable) are obtained utilizing NASCET criteria, using the distal internal carotid diameter as the denominator. CONTRAST:  72m ISOVUE-370 IOPAMIDOL (ISOVUE-370) INJECTION 76% COMPARISON:  Brain MRI 06/18/2021.  Head CT 06/18/2021. FINDINGS: CT HEAD FINDINGS Brain: Cerebral volume is normal for age. Mild patchy and ill-defined hypoattenuation within the cerebral white matter, nonspecific but compatible with chronic small vessel ischemic disease. These findings are stable. There is no acute intracranial hemorrhage. No demarcated cortical infarct. No extra-axial fluid collection. No evidence of an intracranial mass. No midline shift. Vascular: No hyperdense vessel.  Atherosclerotic calcifications. Skull: Normal. Negative for fracture or focal lesion. Sinuses: Mild mucosal thickening within the right frontal sinus. Small mucous retention cysts within the bilateral maxillary sinuses. Orbits: No mass or acute finding. Review of the MIP images confirms the above findings CTA NECK FINDINGS Aortic arch: Standard aortic branching. The visualized aortic arch is normal in caliber. No hemodynamically significant innominate or proximal subclavian artery stenosis. Right carotid system: CCA and ICA patent within the neck without stenosis. Minimal calcified plaque within  the carotid bifurcation. Left carotid system: CCA and ICA patent within the neck without stenosis. Minimal soft plaque within the carotid bifurcation. Vertebral arteries: Codominant and patent within the neck without stenosis. Skeleton: No acute bony abnormality or aggressive osseous lesion. Cervical spondylosis greatest at C6-C7 where there is advanced disc space narrowing, a posterior disc osteophyte complex and uncovertebral hypertrophy. Other neck: No neck mass or cervical  lymphadenopathy. Upper chest: No consolidation within the imaged lung apices. Small calcified granuloma within the right upper lobe. Biapical pleuroparenchymal scarring. Review of the MIP images confirms the above findings CTA HEAD FINDINGS Anterior circulation: The intracranial internal carotid arteries are patent. Sclerotic plaque within both vessels. No stenosis within the intracranial right ICA. Mild stenosis within the distal cavernous/paraclinoid left ICA The M1 middle cerebral arteries are patent. No M2 proximal branch occlusion or high-grade proximal stenosis is identified. The anterior cerebral arteries are patent. 1-2 mm inferiorly projecting vascular protrusion arising from the paraclinoid left ICA, which may reflect an aneurysm or infundibulum (series 12, image 113). 1-2 mm infundibulum arising from the supraclinoid right ICA (series 12, image 97). Posterior circulation: The intracranial vertebral arteries are patent. The basilar artery is patent. The posterior cerebral arteries are patent. Posterior communicating arteries are hypoplastic or absent bilaterally. Venous sinuses: Within the limitations of contrast timing, no convincing thrombus. Anatomic variants: As described Review of the MIP images confirms the above findings IMPRESSION: CT head: 1. No evidence of acute intracranial abnormality. 2. Mild chronic small vessel ischemic changes within the cerebral white matter, stable. 3. Paranasal sinus disease, as described. CTA neck: The common carotid, internal carotid and vertebral arteries are patent within the neck without stenosis. Minimal atherosclerotic plaque within the carotid bifurcations bilaterally. CTA head: 1. No intracranial large vessel occlusion or proximal high-grade arterial stenosis. 2. Atherosclerotic disease within the intracranial internal carotid arteries bilaterally. Mild atherosclerotic narrowing of the distal cavernous/paraclinoid left ICA. 3. 1-2 mm inferiorly projecting  vascular protrusion arising from the paraclinoid left ICA, which may reflect an aneurysm or infundibulum. Electronically Signed   By: Kellie Simmering DO   On: 07/17/2021 09:12   CT ANGIO NECK W OR WO CONTRAST  Result Date: 07/17/2021 CLINICAL DATA:  TIA (transient ischemic attack) G45.9 (ICD-10-CM). Stroke/TIA, assess intracranial arteries. Additional history provided by scanning technologist: Patient reports right lower extremity numbness, blurry vision, memory loss, difficulty speaking, right hand numbness 1 month ago. EXAM: CT ANGIOGRAPHY HEAD AND NECK TECHNIQUE: Multidetector CT imaging of the head and neck was performed using the standard protocol during bolus administration of intravenous contrast. Multiplanar CT image reconstructions and MIPs were obtained to evaluate the vascular anatomy. Carotid stenosis measurements (when applicable) are obtained utilizing NASCET criteria, using the distal internal carotid diameter as the denominator. CONTRAST:  72m ISOVUE-370 IOPAMIDOL (ISOVUE-370) INJECTION 76% COMPARISON:  Brain MRI 06/18/2021.  Head CT 06/18/2021. FINDINGS: CT HEAD FINDINGS Brain: Cerebral volume is normal for age. Mild patchy and ill-defined hypoattenuation within the cerebral white matter, nonspecific but compatible with chronic small vessel ischemic disease. These findings are stable. There is no acute intracranial hemorrhage. No demarcated cortical infarct. No extra-axial fluid collection. No evidence of an intracranial mass. No midline shift. Vascular: No hyperdense vessel.  Atherosclerotic calcifications. Skull: Normal. Negative for fracture or focal lesion. Sinuses: Mild mucosal thickening within the right frontal sinus. Small mucous retention cysts within the bilateral maxillary sinuses. Orbits: No mass or acute finding. Review of the MIP images confirms the above findings CTA NECK FINDINGS Aortic arch: Standard  aortic branching. The visualized aortic arch is normal in caliber. No  hemodynamically significant innominate or proximal subclavian artery stenosis. Right carotid system: CCA and ICA patent within the neck without stenosis. Minimal calcified plaque within the carotid bifurcation. Left carotid system: CCA and ICA patent within the neck without stenosis. Minimal soft plaque within the carotid bifurcation. Vertebral arteries: Codominant and patent within the neck without stenosis. Skeleton: No acute bony abnormality or aggressive osseous lesion. Cervical spondylosis greatest at C6-C7 where there is advanced disc space narrowing, a posterior disc osteophyte complex and uncovertebral hypertrophy. Other neck: No neck mass or cervical lymphadenopathy. Upper chest: No consolidation within the imaged lung apices. Small calcified granuloma within the right upper lobe. Biapical pleuroparenchymal scarring. Review of the MIP images confirms the above findings CTA HEAD FINDINGS Anterior circulation: The intracranial internal carotid arteries are patent. Sclerotic plaque within both vessels. No stenosis within the intracranial right ICA. Mild stenosis within the distal cavernous/paraclinoid left ICA The M1 middle cerebral arteries are patent. No M2 proximal branch occlusion or high-grade proximal stenosis is identified. The anterior cerebral arteries are patent. 1-2 mm inferiorly projecting vascular protrusion arising from the paraclinoid left ICA, which may reflect an aneurysm or infundibulum (series 12, image 113). 1-2 mm infundibulum arising from the supraclinoid right ICA (series 12, image 97). Posterior circulation: The intracranial vertebral arteries are patent. The basilar artery is patent. The posterior cerebral arteries are patent. Posterior communicating arteries are hypoplastic or absent bilaterally. Venous sinuses: Within the limitations of contrast timing, no convincing thrombus. Anatomic variants: As described Review of the MIP images confirms the above findings IMPRESSION: CT head: 1.  No evidence of acute intracranial abnormality. 2. Mild chronic small vessel ischemic changes within the cerebral white matter, stable. 3. Paranasal sinus disease, as described. CTA neck: The common carotid, internal carotid and vertebral arteries are patent within the neck without stenosis. Minimal atherosclerotic plaque within the carotid bifurcations bilaterally. CTA head: 1. No intracranial large vessel occlusion or proximal high-grade arterial stenosis. 2. Atherosclerotic disease within the intracranial internal carotid arteries bilaterally. Mild atherosclerotic narrowing of the distal cavernous/paraclinoid left ICA. 3. 1-2 mm inferiorly projecting vascular protrusion arising from the paraclinoid left ICA, which may reflect an aneurysm or infundibulum. Electronically Signed   By: Kellie Simmering DO   On: 07/17/2021 09:12   TEMPORAL ARTERY  Result Date: 07/16/2021  TEMPORAL ARTERY REPORT Patient Name:  Renee [name]  Date of Exam:   07/16/2021 Medical Rec #: [id number]      Accession #:    [id number] Date of Birth: 06-25-1946       Patient Gender: F Patient Age:   68Y Exam Location:  [facility] Procedure:      VAS [place] [facility] ARTERY BILATERAL Referring Phys: [id number] --------------------------------------------------------------------------------  Indications: Blurred vision and atypical migraine. High Risk Factors: Age > 2 yrs and female.  Comparison Study: No prior studies. Performing Technologist: Darlin Coco RDMS,RVT  Examination Guidelines: Patient in reclined position. 2D, color and spectral doppler sampling in the temporal artery along the hairline and temple in the longitudinal plane. 2D images along the hairline and temple in the transverse plane. Exam is bilateral.  Summary: Absence of a "halo" sign in the bilateral temporal artery, although not definitive, makes a diagnosis of temporal arteritis unlikely.  *See table(s) above for measurements and observations.  Electronically  signed by Antony Contras MD on 07/16/2021 at 12:41:05 PM.   Final    XR Shoulder Left  Result Date: 07/29/2021 No glenohumeral or acromioclavicular joint space narrowing was noted.  No chondrocalcinosis was noted. Impression: Unremarkable x-ray of the shoulder joint.  XR Shoulder Right  Result Date: 07/29/2021 No glenohumeral joint space narrowing was noted.  Inferior humeral spur was noted.  Mild acromioclavicular joint space narrowing and spurring was noted.  No chondrocalcinosis was noted. Impression: These findings are consistent with mild degenerative changes of glenohumeral joint and acromioclavicular joint.   Recent Labs: Lab Results  Component Value Date   WBC 7.9 06/17/2021   HGB 15.6 (H) 06/17/2021   PLT 374 06/17/2021   NA 144 06/17/2021   K 3.8 06/17/2021   CL 108 06/17/2021   CO2 25 06/17/2021   GLUCOSE 105 (H) 06/17/2021   BUN 26 (H) 06/17/2021   CREATININE 0.60 06/17/2021   BILITOT 0.6 06/17/2021   ALKPHOS 56 06/17/2021   AST 24 06/17/2021   ALT 18 06/17/2021   PROT 7.5 06/17/2021   ALBUMIN 4.2 06/17/2021   CALCIUM 9.6 06/17/2021   GFRAA >60 03/28/2018   July 29, 2021 CK 60, RF negative, anti-CCP negative  June 25, 2021 sed rate 15  05/29/21: Uric acid 6.4, ESR 24   Speciality Comments: No specialty comments available.  Procedures:  No procedures performed Allergies: Other, Celexa [citalopram], Codeine, Metformin, Robaxin [methocarbamol], Crestor [rosuvastatin], and Simvastatin   Assessment / Plan:     Visit Diagnoses: No diagnosis found.  Orders: No orders of the defined types were placed in this encounter.  No orders of the defined types were placed in this encounter.   Face-to-face time spent with patient was *** minutes. Greater than 50% of time was spent in counseling and coordination of care.  Follow-Up Instructions: No follow-ups on file.   Bo Merino, MD  Note - This record has been created using Editor, commissioning.  Chart creation  errors have been sought, but may not always  have been located. Such creation errors do not reflect on  the standard of medical care.

## 2021-08-12 DIAGNOSIS — M25511 Pain in right shoulder: Secondary | ICD-10-CM | POA: Insufficient documentation

## 2021-08-12 DIAGNOSIS — G8929 Other chronic pain: Secondary | ICD-10-CM | POA: Insufficient documentation

## 2021-08-12 DIAGNOSIS — M109 Gout, unspecified: Secondary | ICD-10-CM | POA: Insufficient documentation

## 2021-08-12 NOTE — Assessment & Plan Note (Signed)
Low uric acid diet.. check uric acid level today. Treat likely acute flare with prednisone taper.

## 2021-08-12 NOTE — Assessment & Plan Note (Signed)
Stop NSAID and treat with prednisone course.

## 2021-08-12 NOTE — Assessment & Plan Note (Signed)
Check sed rate to eval for PMR given shoulder pain and stiffness in elderly female. No temple pain.

## 2021-08-13 ENCOUNTER — Telehealth: Payer: Self-pay | Admitting: Family Medicine

## 2021-08-13 ENCOUNTER — Other Ambulatory Visit: Payer: Self-pay

## 2021-08-13 ENCOUNTER — Ambulatory Visit: Payer: Medicare Other

## 2021-08-13 DIAGNOSIS — G8929 Other chronic pain: Secondary | ICD-10-CM | POA: Diagnosis not present

## 2021-08-13 DIAGNOSIS — M25512 Pain in left shoulder: Secondary | ICD-10-CM | POA: Diagnosis not present

## 2021-08-13 DIAGNOSIS — M25511 Pain in right shoulder: Secondary | ICD-10-CM | POA: Diagnosis not present

## 2021-08-13 DIAGNOSIS — M6281 Muscle weakness (generalized): Secondary | ICD-10-CM

## 2021-08-13 DIAGNOSIS — E1159 Type 2 diabetes mellitus with other circulatory complications: Secondary | ICD-10-CM

## 2021-08-13 NOTE — Telephone Encounter (Signed)
-----   Message from Ellamae Sia sent at 07/27/2021 10:08 AM EDT ----- Regarding: Lab orders for Friday, 8.19.22 Lab orders for f/u labs

## 2021-08-13 NOTE — Therapy (Signed)
[place] [facility] [address]. [place], [place], [address] Phone: [phone]   Fax:  [phone]  Physical Therapy Treatment  Patient Details  Name: Renee [name] MRN: [id number] Date of Birth: 1946/10/28 Referring Provider (PT): Bo Merino   Encounter Date: 08/13/2021   PT End of Session - 08/13/21 1632     Visit Number 2    Number of Visits 17    Date for PT Re-Evaluation 10/01/21    Authorization Type eval: 08/06/21    PT Start Time 1530    PT Stop Time 1615    PT Time Calculation (min) 45 min    Activity Tolerance Patient tolerated treatment well    Behavior During Therapy [facility] for tasks assessed/performed             Past Medical History:  Diagnosis Date   Allergy    Anxiety    Arthritis    CAD (coronary artery disease)    non-obstructive. cath 11/11   Diabetes mellitus    borderline diabetic - diet controlled    History of chicken pox    History of phobia    clastrophobia   Hyperlipidemia    Hypertension    Hypothyroidism    Kidney stones    hx of    Lichen sclerosus    Psoriasis    Stroke ([place])    Urinary tract infection    hx of     Past Surgical History:  Procedure Laterality Date   APPENDECTOMY     CARDIAC CATHETERIZATION     2011   CARPAL TUNNEL RELEASE     Left trigger finger   CATARACT EXTRACTION W/PHACO Left 06/06/2018   Procedure: CATARACT EXTRACTION PHACO AND INTRAOCULAR LENS PLACEMENT ([place]);  Surgeon: Birder Robson, MD;  Location: [facility] ORS;  Service: Ophthalmology;  Laterality: Left;  [place]  00:23 AP% 16.0 CDE 3.69 Fluid pack lot # [id number]   CATARACT EXTRACTION W/PHACO Right 09/19/2018   Procedure: CATARACT EXTRACTION PHACO AND INTRAOCULAR LENS PLACEMENT ([facility]);  Surgeon: Birder Robson, MD;  Location: [facility] ORS;  Service: Ophthalmology;  Laterality: Right;  [place] 00:29.5 AP% 17.0 CDE$ 5.03 Fluid pack lot # [id number]   COLONOSCOPY WITH PROPOFOL N/A 05/13/2017   Procedure: COLONOSCOPY  WITH PROPOFOL;  Surgeon: Manya Silvas, MD;  Location: [facility] ENDOSCOPY;  Service: Endoscopy;  Laterality: N/A;   ESOPHAGOGASTRODUODENOSCOPY (EGD) WITH PROPOFOL N/A 01/04/2020   Procedure: ESOPHAGOGASTRODUODENOSCOPY (EGD) WITH PROPOFOL;  Surgeon: [name], MD;  Location: [facility] ENDOSCOPY;  Service: Gastroenterology;  Laterality: N/A;   EYE SURGERY  2004   lasik - right eye    JOINT REPLACEMENT     KNEE SURGERY Right 2015   arthroscopy   TOTAL KNEE ARTHROPLASTY Bilateral 06/12/2015   Procedure: TOTAL KNEE BILATERAL;  Surgeon: Gaynelle Arabian, MD;  Location: [facility] ORS;  Service: Orthopedics;  Laterality: Bilateral;  and epidural   TUBAL LIGATION     Tubes tied     and untied. and tied again    There were no vitals filed for this visit.   Subjective Assessment - 08/13/21 1630     Subjective Patient reports she is doing well today.  Reports 4/10 right shoulder pain upon arrival.  Left shoulder is not currently painful at rest.  No specific questions or his currently.    Pertinent History Pt reports that she has a history of bilateral shoulder/arm pain for the last 2.5 years. No history of trauma. "The pain occurs in the muscles." She struggles  with pain when lifting her arms. Pain occasionally wakes her up at night. She also complains of occasional neck pain and reports that she was previously diagnosed with occipital neuralgia. No back pain and pt denies pain in other joints. Denies any redness, swelling, or warmth in shoulders. She reports that she has tried OTC pain relievers and Voltaren gel without significant relief. Per medical record plain film radiographs of shoulders show mild degenerative changes in the R shoulder and no findings in L shoulder. Cervical radiographs showed DDD and facet joint arthropathy. MD diagnosed her with subacromial bursitis and referred her for physical therapy. Return appointment with MD is in September. Years ago she started taking a cholesterol medication and had full  body aches. She stopped and her pain resolved so now she is on a non-statin cholesterol medication. PMH includes psoriasis, DM, and bilateral TKR (2016).                TREATMENT   Manual Therapy  Cervical Rotation AROM: R/L: 30/50 Gentle right shoulder passive range of motion into flexion, abduction, and external rotation; R shoulder GH A/P mobilizations at neutral grade I-II, 30s/bout x 2 bouts; R shoulder GH inferior mobilizations at 90 abduction grade I-II, 30s/bout x 2 bouts; R shoulder GH A/P mobilizations at 90 abduction and available end range ER, grade I-II, 30s/bout x 2 bouts; R shoulder distraction mobilizations gradually progressing through partial abduction PROM;    Ther-ex  Supine R shoulder flexion from neutral to 90 with 1# dumbbell (DB) 2 x 10; Supine R shoulder serratus punch with 1# DB 2 x 10; Supine R shoulder circles clockwise/counterclockwise with 1# DB 2 x 10;  L sidelying R shoulder ER with 1# DB 2 x 10; L sidelying R shoulder abduction with 1# DB 2 x 10; Seated rows with red tband 2 x 10; Seated pulleys for flexion and abduction x 1 minutes each; HEP issued with education about how to perform at home;   Pt educated throughout session about proper posture and technique with exercises. Improved exercise technique, movement at target joints, use of target muscles after min to mod verbal, visual, tactile cues.    Patient demonstrates decreased cervical range of motion and rotation especially to the right.  Initiated passive accessory mobilizations for right shoulder during session today to improve range of motion and decrease pain.  Strengthening performed for right shoulder and periscapular muscles.  Initiated HEP which included seated pulleys for flexion abduction to improve range of motion as well as rows for strength.  Patient encouraged follow-up as scheduled. Pt will benefit from PT services to address deficits in strength, range of motion, and pain in  order to return to full function at home.                     PT Short Term Goals - 08/09/21 1552       PT SHORT TERM GOAL #1   Title Pt will be independent with HEP in order to improve shoulder range of motion and strength as well as decrease pain in order to improve pain-free function at home.    Baseline --    Time 4    Period Weeks    Status New    Target Date 09/03/21               PT Long Term Goals - 08/09/21 1553       PT LONG TERM GOAL #1   Title Pt will decrease  worst bilateral shoulder pain as reported on NPRS by at least 3 points in order to demonstrate clinically significant reduction in shoulder pain.    Baseline 08/06/21: Worst: 9/10    Time 8    Period Weeks    Status New    Target Date 10/01/21      PT LONG TERM GOAL #2   Title Pt will increase FOTO to at 62 least in order to demonstrate significant improvement in function related to bilateral shoulder pain    Baseline 08/06/21: 48    Time 8    Period Weeks    Status New    Target Date 10/01/21      PT LONG TERM GOAL #3   Title Pt will report at least 50% improvement in her symptoms in order to improve pain-free function at home    Time 8    Period Weeks    Status New    Target Date 10/01/21                   Plan - 08/13/21 1616     Clinical Impression Statement Patient demonstrates decreased cervical range of motion and rotation especially to the right.  Initiated passive accessory mobilizations for right shoulder during session today to improve range of motion and decrease pain.  Strengthening performed for right shoulder and periscapular muscles.  Initiated HEP which included seated pulleys for flexion abduction to improve range of motion as well as rows for strength.  Patient encouraged follow-up as scheduled. Pt will benefit from PT services to address deficits in strength, range of motion, and pain in order to return to full function at home.    Personal Factors and  Comorbidities Age;Comorbidity 3+;Past/Current Experience;Time since onset of injury/illness/exacerbation    Comorbidities psoriasis, anxiety, DM    Examination-Activity Limitations Reach Overhead    Examination-Participation Restrictions Meal Prep;Laundry;Cleaning;Shop    Stability/Clinical Decision Making Evolving/Moderate complexity    Rehab Potential Good    PT Frequency 2x / week    PT Duration 8 weeks    PT Treatment/Interventions ADLs/Self Care Home Management;Aquatic Therapy;Biofeedback;Canalith Repostioning;Cryotherapy;Electrical Stimulation;Iontophoresis '4mg'$ /ml Dexamethasone;Moist Heat;Traction;Ultrasound;Gait training;Functional mobility training;Therapeutic activities;Therapeutic exercise;Balance training;Neuromuscular re-education;Manual techniques;Dry needling;Vestibular;Spinal Manipulations;Joint Manipulations;Passive range of motion    PT Next Visit Plan continue manual therapy and strengthening for shoulders    PT Home Exercise Plan Access Code: [email or web address]    Consulted and Agree with Plan of Care Patient             Patient will benefit from skilled therapeutic intervention in order to improve the following deficits and impairments:  Pain, Decreased range of motion, Decreased strength  Visit Diagnosis: Chronic right shoulder pain  Muscle weakness (generalized)  Chronic left shoulder pain     Problem List Patient Active Problem List   Diagnosis Date Noted   Acute gout involving toe of right foot 08/12/2021   Chronic pain of both shoulders 08/12/2021   History of TIA (transient ischemic attack) 06/26/2021   Statin intolerance 01/15/2021   Myalgia 12/22/2020   MDD (major depressive disorder), recurrent episode, moderate (HCC) 07/08/2020   Chronic insomnia 07/08/2020   Chronic neck pain 10/05/2019   Chronic diarrhea 03/30/2018   GAD (generalized anxiety disorder) 01/31/2018   Osteopenia 12/08/2017   Vitamin D deficiency 03/04/2016   Allergic rhinitis 03/04/2016    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 [place], [place], [address] Phone: [phone]   Fax:  [phone]  Name: Renee [name] MRN: [id number] Date of Birth: 09/10/1946

## 2021-08-13 NOTE — Patient Instructions (Signed)
Access Code: [email or web address] URL: [email or web address] Date: 08/13/2021 Prepared by: Roxana Hires  Exercises Seated Shoulder Flexion AAROM with Pulley Behind - 1 x daily - 7 x weekly - 2 sets - 10 reps - 3s hold Seated Shoulder Abduction AAROM with Pulley Behind - 1 x daily - 7 x weekly - 2 sets - 10 reps - 3s hold Standing Shoulder Row with Anchored Resistance - 1 x daily - 7 x weekly - 2 sets - 10 reps - 3s hold

## 2021-08-14 ENCOUNTER — Other Ambulatory Visit (INDEPENDENT_AMBULATORY_CARE_PROVIDER_SITE_OTHER): Payer: Medicare Other

## 2021-08-14 DIAGNOSIS — E1159 Type 2 diabetes mellitus with other circulatory complications: Secondary | ICD-10-CM | POA: Diagnosis not present

## 2021-08-14 LAB — HEMOGLOBIN A1C: Hgb A1c MFr Bld: 6.6 % — ABNORMAL HIGH (ref 4.6–6.5)

## 2021-08-14 LAB — COMPREHENSIVE METABOLIC PANEL
ALT: 19 U/L (ref 0–35)
AST: 21 U/L (ref 0–37)
Albumin: 4.2 g/dL (ref 3.5–5.2)
Alkaline Phosphatase: 59 U/L (ref 39–117)
BUN: 21 mg/dL (ref 6–23)
CO2: 27 mEq/L (ref 19–32)
Calcium: 9.6 mg/dL (ref 8.4–10.5)
Chloride: 106 mEq/L (ref 96–112)
Creatinine, Ser: 0.68 mg/dL (ref 0.40–1.20)
GFR: 84.28 mL/min (ref >60.00)
Glucose, Bld: 115 mg/dL — ABNORMAL HIGH (ref 70–99)
Potassium: 4.1 mEq/L (ref 3.5–5.1)
Sodium: 141 mEq/L (ref 135–145)
Total Bilirubin: 0.4 mg/dL (ref 0.2–1.2)
Total Protein: 7.2 g/dL (ref 6.0–8.3)

## 2021-08-14 LAB — LIPID PANEL
Cholesterol: 146 mg/dL (ref 0–200)
HDL: 45.8 mg/dL (ref >39.00)
LDL Cholesterol: 84 mg/dL (ref 0–99)
NonHDL: 100.66
Total CHOL/HDL Ratio: 3
Triglycerides: 81 mg/dL (ref 0.0–149.0)
VLDL: 16.2 mg/dL (ref 0.0–40.0)

## 2021-08-14 NOTE — Progress Notes (Signed)
No critical labs need to be addressed urgently. We will discuss labs in detail at upcoming office visit.   

## 2021-08-18 ENCOUNTER — Ambulatory Visit: Payer: Medicare Other

## 2021-08-18 ENCOUNTER — Other Ambulatory Visit: Payer: Self-pay

## 2021-08-18 DIAGNOSIS — M25512 Pain in left shoulder: Secondary | ICD-10-CM

## 2021-08-18 DIAGNOSIS — M6281 Muscle weakness (generalized): Secondary | ICD-10-CM | POA: Diagnosis not present

## 2021-08-18 DIAGNOSIS — G8929 Other chronic pain: Secondary | ICD-10-CM | POA: Diagnosis not present

## 2021-08-18 DIAGNOSIS — M25511 Pain in right shoulder: Secondary | ICD-10-CM | POA: Diagnosis not present

## 2021-08-18 NOTE — Progress Notes (Signed)
Office Visit Note  [name]             Date of Birth: 09-17-1946           MRN: [id number]             PCP: Jinny Sanders, MD Referring: Jinny Sanders, MD Visit Date: 08/21/2021 Occupation: '@GUAROCC'$ @  Subjective:  Pain in both shoulders, neck and hands   History of Present Illness: SHARLIZE LAUS is a 75 y.o. female history of osteoarthritis.  She has been going to physical therapy which has been helpful.  Although she is a still having difficulty raising her right arm.  She also complains of discomfort in the left shoulder and left trapezius region.  She continues to have pain and stiffness in her bilateral hands.  Her feet are feeling better.  She has discomfort in the bilateral knee joints.  She denies any history of joint swelling.  Activities of Daily Living:  Patient reports morning stiffness for 0 minutes.   Patient Reports nocturnal pain.  Difficulty dressing/grooming: Denies Difficulty climbing stairs: Denies Difficulty getting out of chair: Denies Difficulty using hands for taps, buttons, cutlery, and/or writing: Reports  Review of Systems  Constitutional:  Negative for fatigue.  HENT:  Positive for mouth dryness. Negative for mouth sores and nose dryness.   Eyes:  Positive for dryness. Negative for pain and itching.  Respiratory:  Negative for shortness of breath and difficulty breathing.   Cardiovascular:  Negative for chest pain and palpitations.  Gastrointestinal:  Negative for blood in stool, constipation and diarrhea.  Endocrine: Negative for increased urination.  Genitourinary:  Negative for difficulty urinating.  Musculoskeletal:  Positive for joint pain, joint pain, myalgias, muscle tenderness and myalgias. Negative for morning stiffness.  Skin:  Positive for rash and redness. Negative for color change.  Allergic/Immunologic: Negative for susceptible to infections.  Neurological:  Positive for memory loss. Negative for dizziness, numbness,  headaches and weakness.  Hematological:  Positive for bruising/bleeding tendency.  Psychiatric/Behavioral:  Negative for confusion.    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 cath 11/11   Diabetes mellitus    borderline diabetic - diet controlled    History of chicken pox    History of phobia    clastrophobia   Hyperlipidemia    Hypertension    Hypothyroidism    Kidney stones  hx of    Lichen sclerosus    Psoriasis    Stroke ([place])    Urinary tract infection    hx of     Family History  Problem Relation Age of Onset   COPD Mother    Diabetes Mother    Hypertension Mother    Hypertension Father    Diabetes Sister    Hypertension Sister    Diabetes Brother    Arthritis Brother    Heart disease Brother    Arthritis Brother    Diabetes Brother    Heart disease Brother     Stomach cancer Maternal Grandfather    Stroke Maternal Grandfather    Breast cancer Daughter 67       Double mastectomy   Past Surgical History:  Procedure Laterality Date   APPENDECTOMY     CARDIAC CATHETERIZATION     2011   CARPAL TUNNEL RELEASE     Left trigger finger   CATARACT EXTRACTION W/PHACO Left 06/06/2018   Procedure: CATARACT EXTRACTION PHACO AND INTRAOCULAR LENS PLACEMENT ([place]);  Surgeon: Birder Robson, MD;  Location: [facility] ORS;  Service: Ophthalmology;  Laterality: Left;  [place]  00:23 AP% 16.0 CDE 3.69 Fluid pack lot # [id number]   CATARACT EXTRACTION W/PHACO Right 09/19/2018   Procedure: CATARACT EXTRACTION PHACO AND INTRAOCULAR LENS PLACEMENT ([facility]);  Surgeon: Birder Robson, MD;  Location: [facility] ORS;  Service: Ophthalmology;  Laterality: Right;  [place] 00:29.5 AP% 17.0 CDE$ 5.03 Fluid pack lot # [id number]   COLONOSCOPY WITH PROPOFOL N/A 05/13/2017   Procedure: COLONOSCOPY WITH PROPOFOL;  Surgeon: Manya Silvas, MD;  Location: [facility] ENDOSCOPY;  Service: Endoscopy;  Laterality: N/A;   ESOPHAGOGASTRODUODENOSCOPY (EGD) WITH PROPOFOL N/A 01/04/2020   Procedure: ESOPHAGOGASTRODUODENOSCOPY (EGD) WITH PROPOFOL;  Surgeon: [name], MD;  Location: [facility] ENDOSCOPY;  Service: Gastroenterology;  Laterality: N/A;   EYE SURGERY  2004   lasik - right eye    JOINT REPLACEMENT     KNEE SURGERY Right 2015   arthroscopy   TOTAL KNEE ARTHROPLASTY Bilateral 06/12/2015   Procedure: TOTAL KNEE BILATERAL;  Surgeon: Gaynelle Arabian, MD;  Location: [facility] ORS;  Service: Orthopedics;  Laterality: Bilateral;  and epidural   TUBAL LIGATION     Tubes tied     and untied. and tied again   Social History   Social History Narrative   Lives alone   Right Handed   Drink 1 cups caffeine daily   Immunization History  Administered Date(s) Administered   H1N1 11/02/2008   Influenza Split 10/22/2006, 10/28/2007, 09/26/2008, 09/13/2009, 09/12/2010, 09/18/2011, 09/27/2012   Influenza, High Dose Seasonal PF  09/11/2016, 08/28/2019, 09/22/2020   Influenza,inj,Quad PF,6+ Mos 09/13/2013, 09/13/2014, 09/20/2017, 09/25/2018   Influenza-Unspecified 08/27/2014, 09/18/2015   PFIZER(Purple Top)SARS-COV-2 Vaccination 01/03/2020, 01/26/2020, 11/05/2020   Pneumococcal Conjugate-13 04/05/2014   Pneumococcal Polysaccharide-23 09/26/2008, 02/08/2017   Tdap 09/27/2012     Objective: Vital Signs: BP (!) 147/85 (BP Location: Left Arm, Patient Position: Sitting, Cuff Size: Normal)   Pulse 75   Ht '5\' 3"'$  (1.6 m)   Wt 145 lb 12.8 oz (66.1 kg)   BMI 25.83 kg/m    Physical Exam Vitals and nursing note reviewed.  Constitutional:      Appearance: She is well-developed.  HENT:     Head: Normocephalic and atraumatic.  Eyes:     Conjunctiva/sclera: Conjunctivae normal.  Cardiovascular:     Rate and Rhythm: Normal rate and regular rhythm.     Heart sounds: Normal heart sounds.  Pulmonary:  Effort: Pulmonary effort is normal.     Breath sounds: Normal breath sounds.  Abdominal:     General: Bowel sounds are normal.     Palpations: Abdomen is soft.  Musculoskeletal:     Cervical back: Normal range of motion.  Lymphadenopathy:     Cervical: No cervical adenopathy.  Skin:    General: Skin is warm and dry.     Capillary Refill: Capillary refill takes less than 2 seconds.     Comments: Her skin on fingertips was dry.  Neurological:     Mental Status: She is alert and oriented to person, place, and time.  Psychiatric:        Behavior: Behavior normal.     Musculoskeletal Exam: She has good range of motion of her cervical spine.  She had tenderness over bilateral trapezius region more so on the left side.  She had discomfort range of motion of bilateral shoulder joints.  Right shoulder joint abduction was limited to underlying 60 degrees.  Elbow joints and wrist joints in good range of motion.  She had bilateral PIP and DIP thickening with no synovitis.  Hip joints and knee joints were in good range of  motion.  Bilateral knee joints are replaced.  There was no tenderness or synovitis over her MCPs PIPs or DIPs.  CDAI Exam: CDAI Score: -- Patient Global: --; Provider Global: -- Swollen: --; Tender: -- Joint Exam 08/21/2021   No joint exam has been documented for this visit   There is currently no information documented on the homunculus. Go to the Rheumatology activity and complete the homunculus joint exam.  Investigation: No additional findings.  Imaging: XR Shoulder Left  Result Date: 07/29/2021 No glenohumeral or acromioclavicular joint space narrowing was noted.  No chondrocalcinosis was noted. Impression: Unremarkable x-ray of the shoulder joint.  XR Shoulder Right  Result Date: 07/29/2021 No glenohumeral joint space narrowing was noted.  Inferior humeral spur was noted.  Mild acromioclavicular joint space narrowing and spurring was noted.  No chondrocalcinosis was noted. Impression: These findings are consistent with mild degenerative changes of glenohumeral joint and acromioclavicular joint.   Recent Labs: Lab Results  Component Value Date   WBC 7.9 06/17/2021   HGB 15.6 (H) 06/17/2021   PLT 374 06/17/2021   NA 141 08/14/2021   K 4.1 08/14/2021   CL 106 08/14/2021   CO2 27 08/14/2021   GLUCOSE 115 (H) 08/14/2021   BUN 21 08/14/2021   CREATININE 0.68 08/14/2021   BILITOT 0.4 08/14/2021   ALKPHOS 59 08/14/2021   AST 21 08/14/2021   ALT 19 08/14/2021   PROT 7.2 08/14/2021   ALBUMIN 4.2 08/14/2021   CALCIUM 9.6 08/14/2021   GFRAA >60 03/28/2018   July 29, 2021 CK 60, RF negative, anti-CCP negative  Speciality Comments: No specialty comments available.  Procedures:  Large Joint Inj: R subacromial bursa on 08/21/2021 10:27 AM Indications: pain Details: 27 G 1.5 in needle, posterior approach  Arthrogram: No  Medications: 1 mL lidocaine 1 %; 40 mg triamcinolone acetonide 40 MG/ML Aspirate: 0 mL Outcome: tolerated well, no immediate complications Procedure,  treatment alternatives, risks and benefits explained, specific risks discussed. Consent was given by the patient. Immediately prior to procedure a time out was called to verify the correct patient, procedure, equipment, support staff and site/side marked as required. Patient was prepped and draped in the usual sterile fashion.    Trigger Point Inj  Date/Time: 08/21/2021 10:28 AM Performed by: Bo Merino, MD Authorized by:  Bo Merino, MD   Consent Given by:  Patient Site marked: the procedure site was marked   Timeout: prior to procedure the correct patient, procedure, and site was verified   Indications:  Muscle spasm and pain Total # of Trigger Points:  1 Location: neck   Needle Size:  27 G Approach:  Dorsal Medications #1:  0.5 mL lidocaine 1 %; 10 mg triamcinolone acetonide 40 MG/ML Patient tolerance:  Patient tolerated the procedure well with no immediate complications Allergies: Other, Celexa [citalopram], Codeine, Metformin, Robaxin [methocarbamol], Crestor [rosuvastatin], and Simvastatin   Assessment / Plan:     Visit Diagnoses: Chronic pain of both shoulders - X-ray showed mild degenerative changes.  She also had subacromial bursitis.  She was referred to physical therapy.  She continues to have pain and discomfort in her shoulders.  She has difficulty raising her arms.  I discussed cortisone injection with the patient and her daughter.  Patient wanted to proceed with a cortisone injection in the right subacromial region.  After informed consent was obtained the right subacromial bursa was injected with cortisone as described above.  She tolerated the procedure well.  Postprocedure instructions were given.  Pain in both hands -she has been experiencing pain and discomfort in her bilateral hands.  She had bilateral PIP and DIP thickening but no synovitis was noted.  X-rays were obtained today.  Plan: XR Hand 2 View Right, XR Hand 2 View Left.  X-ray of bilateral hands  consistent with osteoarthritis.  Generalized osteopenia was noted.  No erosive changes were noted.  History of bilateral knee replacement - 2016 by Dr. Maureen Ralphs.  Pain in both feet - She is followed by podiatrist.  She also has calluses on her feet.  Trapezius muscle spasm-she had bilateral trapezius spasm more prominent on the left side.  After informed consent was obtained left trapezius region was injected with cyst described above.  Patient tolerated the procedure well.  DDD (degenerative disc disease), cervical - X-rays were consistent with degenerative disc disease and facet joint arthropathy.  She has been going to physical therapy which has been helpful.  Psoriasis - She is followed by dermatologist in [place].  She is followed by a dermatologist in [place].followed by a dermatologist in [place] a dermatologist .  Osteopenia of multiple sites-she is on calcium and vitamin D.  Postural kyphosis of thoracic region  Hypertension associated with diabetes (HCC)-she has been advised to monitor blood pressure and blood sugar closely after cortisone injection.  Atherosclerosis of native coronary artery of native heart with stable angina pectoris ([place])  Hyperlipidemia associated with type 2 diabetes mellitus (HCC)-patient was advised to monitor blood sugar closely.  Statin intolerance  Controlled type 2 diabetes mellitus with other circulatory complication, without long-term current use of insulin (HCC)  History of TIA (transient ischemic attack)  Adult hypothyroidism  Vitamin D deficiency  Anxiety and depression  Chronic insomnia  Orders: Orders Placed This Encounter  Procedures   Large Joint Inj   Trigger Point Inj   XR Hand 2 View Right   XR Hand 2 View Left    No orders of the defined types were placed in this encounter.    Follow-Up Instructions: Return in about 3 months (around 11/21/2021) for Osteoarthritis.   Bo Merino, MD  Note - This  record has been created using Editor, commissioning.  Chart creation errors have been sought, but may not always  have been located. Such creation errors do not reflect on  the standard  of medical care.

## 2021-08-18 NOTE — Therapy (Signed)
[facility] [address] [place], [place], [address] Phone: [phone]   Fax:  [phone]  Physical Therapy Treatment  Patient Details  Name: Renee [name] MRN: [id number] Date of Birth: Jul 29, 1946 Referring Provider (PT): Bo Merino   Encounter Date: 08/18/2021   PT End of Session - 08/18/21 1526     Visit Number 3    Number of Visits 17    Date for PT Re-Evaluation 10/01/21    Authorization Type eval: 08/06/21    PT Start Time 1530    PT Stop Time 1615    PT Time Calculation (min) 45 min    Activity Tolerance Patient tolerated treatment well    Behavior During Therapy [facility] for tasks assessed/performed             Past Medical History:  Diagnosis Date   Allergy    Anxiety    Arthritis    CAD (coronary artery disease)    non-obstructive. cath 11/11   Diabetes mellitus    borderline diabetic - diet controlled    History of chicken pox    History of phobia    clastrophobia   Hyperlipidemia    Hypertension    Hypothyroidism    Kidney stones    hx of    Lichen sclerosus    Psoriasis    Stroke ([place])    Urinary tract infection    hx of     Past Surgical History:  Procedure Laterality Date   APPENDECTOMY     CARDIAC CATHETERIZATION     2011   CARPAL TUNNEL RELEASE     Left trigger finger   CATARACT EXTRACTION W/PHACO Left 06/06/2018   Procedure: CATARACT EXTRACTION PHACO AND INTRAOCULAR LENS PLACEMENT ([place]);  Surgeon: Birder Robson, MD;  Location: [facility] ORS;  Service: Ophthalmology;  Laterality: Left;  [place]  00:23 AP% 16.0 CDE 3.69 Fluid pack lot # [id number]   CATARACT EXTRACTION W/PHACO Right 09/19/2018   Procedure: CATARACT EXTRACTION PHACO AND INTRAOCULAR LENS PLACEMENT ([facility]);  Surgeon: Birder Robson, MD;  Location: [facility] ORS;  Service: Ophthalmology;  Laterality: Right;  [place] 00:29.5 AP% 17.0 CDE$ 5.03 Fluid pack lot # [id number]   COLONOSCOPY WITH PROPOFOL N/A 05/13/2017   Procedure: COLONOSCOPY  WITH PROPOFOL;  Surgeon: Manya Silvas, MD;  [facility] ENDOSCOPY;  Service: Endoscopy;  Laterality: N/A;   ESOPHAGOGASTRODUODENOSCOPY (EGD) WITH PROPOFOL N/A 01/04/2020   Procedure: ESOPHAGOGASTRODUODENOSCOPY (EGD) WITH PROPOFOL;  Surgeon: [name], MD;  Location: [facility] ENDOSCOPY;  Service: Gastroenterology;  Laterality: N/A;   EYE SURGERY  2004   lasik - right eye    JOINT REPLACEMENT     KNEE SURGERY Right 2015   arthroscopy   TOTAL KNEE ARTHROPLASTY Bilateral 06/12/2015   Procedure: TOTAL KNEE BILATERAL;  Surgeon: Gaynelle Arabian, MD;  Location: [facility] ORS;  Service: Orthopedics;  Laterality: Bilateral;  and epidural   TUBAL LIGATION     Tubes tied     and untied. and tied again    There were no vitals filed for this visit.   Subjective Assessment - 08/18/21 1526     Subjective Patient reports she is doing well today.  Reports 4/10 right shoulder pain upon arrival.  Left shoulder is not currently painful at rest.  No specific questions or concerns currently.    Pertinent History Pt reports that she has a history of bilateral shoulder/arm pain for the last 2.5 years. No history of trauma. "The pain occurs in the muscles." She struggles  with pain when lifting her arms. Pain occasionally wakes her up at night. She also complains of occasional neck pain and reports that she was previously diagnosed with occipital neuralgia. No back pain and pt denies pain in other joints. Denies any redness, swelling, or warmth in shoulders. She reports that she has tried OTC pain relievers and Voltaren gel without significant relief. Per medical record plain film radiographs of shoulders show mild degenerative changes in the R shoulder and no findings in L shoulder. Cervical radiographs showed DDD and facet joint arthropathy. MD diagnosed her with subacromial bursitis and referred her for physical therapy. Return appointment with MD is in September. Years ago she started taking a cholesterol medication and had  full body aches. She stopped and her pain resolved so now she is on a non-statin cholesterol medication. PMH includes psoriasis, DM, and bilateral TKR (2016).                TREATMENT   Manual Therapy  Gentle right shoulder passive range of motion into flexion, abduction, and external rotation; R shoulder GH A/P mobilizations at neutral grade I-II, 30s/bout x 2 bouts; R shoulder GH inferior mobilizations at 90 abduction grade I-II, 30s/bout x 2 bouts; R shoulder GH A/P mobilizations at 90 abduction and available end range ER, grade I-II, 30s/bout x 2 bouts; R shoulder distraction mobilizations gradually progressing through partial abduction PROM;    Ther-ex  Supine R shoulder flexion from neutral to 90 with 2# dumbbell (DB) 2 x 10; Supine R shoulder serratus punch with manual resistance from therapist 2 x 10; Supine R shoulder circles clockwise/counterclockwise with 2# DB 2 x 10;  Supine cane chest press with manual resistance from therapist 2 x 10; L sidelying R shoulder ER with 2# DB 2 x 10; L sidelying R shoulder abduction with 2# DB 2 x 10; Seated Nautilus rows 30# 3 x 10; Seated Nautilus lat pull downs 30# 3 x 10; Overhead ball circles on wall clockwise/counterclockwise x 20 each bilaterally;   Pt educated throughout session about proper posture and technique with exercises. Improved exercise technique, movement at target joints, use of target muscles after min to mod verbal, visual, tactile cues.    Continued passive accessory mobilizations for right shoulder during session today to improve range of motion and decrease pain.  Strengthening performed for right shoulder and periscapular muscles.  Progressed patient to Nautilus strengthening during session today.  Will continue progressive manual techniques and strengthening to improve long-term management of shoulder pain.  Patient encouraged to follow-up as scheduled. She will benefit from PT services to address deficits in  strength, range of motion, and pain in order to return to full function at home.                     PT Short Term Goals - 08/09/21 1552       PT SHORT TERM GOAL #1   Title Pt will be independent with HEP in order to improve shoulder range of motion and strength as well as decrease pain in order to improve pain-free function at home.    Baseline --    Time 4    Period Weeks    Status New    Target Date 09/03/21               PT Long Term Goals - 08/09/21 1553       PT LONG TERM GOAL #1   Title Pt will decrease worst bilateral shoulder  pain as reported on NPRS by at least 3 points in order to demonstrate clinically significant reduction in shoulder pain.    Baseline 08/06/21: Worst: 9/10    Time 8    Period Weeks    Status New    Target Date 10/01/21      PT LONG TERM GOAL #2   Title Pt will increase FOTO to at 62 least in order to demonstrate significant improvement in function related to bilateral shoulder pain    Baseline 08/06/21: 48    Time 8    Period Weeks    Status New    Target Date 10/01/21      PT LONG TERM GOAL #3   Title Pt will report at least 50% improvement in her symptoms in order to improve pain-free function at home    Time 8    Period Weeks    Status New    Target Date 10/01/21                   Plan - 08/18/21 1526     Clinical Impression Statement Continued passive accessory mobilizations for right shoulder during session today to improve range of motion and decrease pain.  Strengthening performed for right shoulder and periscapular muscles.  Progressed patient to Nautilus strengthening during session today.  Will continue progressive manual techniques and strengthening to improve long-term management of shoulder pain.  Patient encouraged to follow-up as scheduled. She will benefit from PT services to address deficits in strength, range of motion, and pain in order to return to full function at home.    Personal Factors  and Comorbidities Age;Comorbidity 3+;Past/Current Experience;Time since onset of injury/illness/exacerbation    Comorbidities psoriasis, anxiety, DM    Examination-Activity Limitations Reach Overhead    Examination-Participation Restrictions Meal Prep;Laundry;Cleaning;Shop    Stability/Clinical Decision Making Evolving/Moderate complexity    Rehab Potential Good    PT Frequency 2x / week    PT Duration 8 weeks    PT Treatment/Interventions ADLs/Self Care Home Management;Aquatic Therapy;Biofeedback;Canalith Repostioning;Cryotherapy;Electrical Stimulation;Iontophoresis '4mg'$ /ml Dexamethasone;Moist Heat;Traction;Ultrasound;Gait training;Functional mobility training;Therapeutic activities;Therapeutic exercise;Balance training;Neuromuscular re-education;Manual techniques;Dry needling;Vestibular;Spinal Manipulations;Joint Manipulations;Passive range of motion    PT Next Visit Plan continue manual therapy and strengthening for shoulders    PT Home Exercise Plan Access Code: [email or web address]    Consulted and Agree with Plan of Care Patient             Patient will benefit from skilled therapeutic intervention in order to improve the following deficits and impairments:  Pain, Decreased range of motion, Decreased strength  Visit Diagnosis: Chronic right shoulder pain  Chronic left shoulder pain  Muscle weakness (generalized)     Problem List Patient Active Problem List   Diagnosis Date Noted   Acute gout involving toe of right foot 08/12/2021   Chronic pain of both shoulders 08/12/2021   History of TIA (transient ischemic attack) 06/26/2021   Statin intolerance 01/15/2021   Myalgia 12/22/2020   MDD (major depressive disorder), recurrent episode, moderate (HCC) 07/08/2020   Chronic insomnia 07/08/2020   Chronic neck pain 10/05/2019   Chronic diarrhea 03/30/2018   GAD (generalized anxiety disorder) 01/31/2018   Osteopenia 12/08/2017   Vitamin D deficiency 03/04/2016   Allergic rhinitis  03/04/2016   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 [place], [place], [address] Phone: [phone]   Fax:  [phone]  Name: Renee [name] MRN: [id number] Date of Birth: 1946/07/27

## 2021-08-20 ENCOUNTER — Ambulatory Visit: Payer: Medicare Other | Admitting: Rheumatology

## 2021-08-20 ENCOUNTER — Other Ambulatory Visit: Payer: Self-pay

## 2021-08-20 ENCOUNTER — Ambulatory Visit: Payer: Medicare Other

## 2021-08-20 DIAGNOSIS — M6281 Muscle weakness (generalized): Secondary | ICD-10-CM | POA: Diagnosis not present

## 2021-08-20 DIAGNOSIS — M25512 Pain in left shoulder: Secondary | ICD-10-CM | POA: Diagnosis not present

## 2021-08-20 DIAGNOSIS — M4004 Postural kyphosis, thoracic region: Secondary | ICD-10-CM

## 2021-08-20 DIAGNOSIS — E559 Vitamin D deficiency, unspecified: Secondary | ICD-10-CM

## 2021-08-20 DIAGNOSIS — G8929 Other chronic pain: Secondary | ICD-10-CM

## 2021-08-20 DIAGNOSIS — M8589 Other specified disorders of bone density and structure, multiple sites: Secondary | ICD-10-CM

## 2021-08-20 DIAGNOSIS — M5030 Other cervical disc degeneration, unspecified cervical region: Secondary | ICD-10-CM

## 2021-08-20 DIAGNOSIS — E039 Hypothyroidism, unspecified: Secondary | ICD-10-CM

## 2021-08-20 DIAGNOSIS — E1159 Type 2 diabetes mellitus with other circulatory complications: Secondary | ICD-10-CM

## 2021-08-20 DIAGNOSIS — Z96653 Presence of artificial knee joint, bilateral: Secondary | ICD-10-CM

## 2021-08-20 DIAGNOSIS — I25118 Atherosclerotic heart disease of native coronary artery with other forms of angina pectoris: Secondary | ICD-10-CM

## 2021-08-20 DIAGNOSIS — M25511 Pain in right shoulder: Secondary | ICD-10-CM | POA: Diagnosis not present

## 2021-08-20 DIAGNOSIS — Z8673 Personal history of transient ischemic attack (TIA), and cerebral infarction without residual deficits: Secondary | ICD-10-CM

## 2021-08-20 DIAGNOSIS — E1169 Type 2 diabetes mellitus with other specified complication: Secondary | ICD-10-CM

## 2021-08-20 DIAGNOSIS — L409 Psoriasis, unspecified: Secondary | ICD-10-CM

## 2021-08-20 DIAGNOSIS — M79671 Pain in right foot: Secondary | ICD-10-CM

## 2021-08-20 DIAGNOSIS — Z789 Other specified health status: Secondary | ICD-10-CM

## 2021-08-20 DIAGNOSIS — F419 Anxiety disorder, unspecified: Secondary | ICD-10-CM

## 2021-08-20 DIAGNOSIS — F5104 Psychophysiologic insomnia: Secondary | ICD-10-CM

## 2021-08-20 NOTE — Therapy (Signed)
[facility] [address] [place], [place], [address] Phone: [phone]   Fax:  [phone]  Physical Therapy Treatment  Patient Details  Name: Renee [name] MRN: [id number] Date of Birth: Oct 03, 1946 Referring Provider (PT): Bo Merino   Encounter Date: 08/20/2021   PT End of Session - 08/20/21 1108     Visit Number 4    Number of Visits 17    Date for PT Re-Evaluation 10/01/21    Authorization Type eval: 08/06/21    PT Start Time 1100    PT Stop Time 1145    PT Time Calculation (min) 45 min    Activity Tolerance Patient tolerated treatment well    Behavior During Therapy [facility] for tasks assessed/performed             Past Medical History:  Diagnosis Date   Allergy    Anxiety    Arthritis    CAD (coronary artery disease)    non-obstructive. cath 11/11   Diabetes mellitus    borderline diabetic - diet controlled    History of chicken pox    History of phobia    clastrophobia   Hyperlipidemia    Hypertension    Hypothyroidism    Kidney stones    hx of    Lichen sclerosus    Psoriasis    Stroke ([place])    Urinary tract infection    hx of     Past Surgical History:  Procedure Laterality Date   APPENDECTOMY     CARDIAC CATHETERIZATION     2011   CARPAL TUNNEL RELEASE     Left trigger finger   CATARACT EXTRACTION W/PHACO Left 06/06/2018   Procedure: CATARACT EXTRACTION PHACO AND INTRAOCULAR LENS PLACEMENT ([place]);  Surgeon: Birder Robson, MD;  Location: [facility] ORS;  Service: Ophthalmology;  Laterality: Left;  [place]  00:23 AP% 16.0 CDE 3.69 Fluid pack lot # [id number]   CATARACT EXTRACTION W/PHACO Right 09/19/2018   Procedure: CATARACT EXTRACTION PHACO AND INTRAOCULAR LENS PLACEMENT ([facility]);  Surgeon: Birder Robson, MD;  Location: [facility] ORS;  Service: Ophthalmology;  Laterality: Right;  [place] 00:29.5 AP% 17.0 CDE$ 5.03 Fluid pack lot # [id number]   COLONOSCOPY WITH PROPOFOL N/A 05/13/2017   Procedure: COLONOSCOPY  WITH PROPOFOL;  Surgeon: Manya Silvas, MD;  Location: [facility] ENDOSCOPY;  Service: Endoscopy;  Laterality: N/A;   ESOPHAGOGASTRODUODENOSCOPY (EGD) WITH PROPOFOL N/A 01/04/2020   Procedure: ESOPHAGOGASTRODUODENOSCOPY (EGD) WITH PROPOFOL;  Surgeon: [name], MD;  Location: [facility] ENDOSCOPY;  Service: Gastroenterology;  Laterality: N/A;   EYE SURGERY  2004   lasik - right eye    JOINT REPLACEMENT     KNEE SURGERY Right 2015   arthroscopy   TOTAL KNEE ARTHROPLASTY Bilateral 06/12/2015   Procedure: TOTAL KNEE BILATERAL;  Surgeon: Gaynelle Arabian, MD;  Location: [facility] ORS;  Service: Orthopedics;  Laterality: Bilateral;  and epidural   TUBAL LIGATION     Tubes tied     and untied. and tied again    There were no vitals filed for this visit.   Subjective Assessment - 08/20/21 1107     Subjective Patient reports she is doing well today.  Reports 5/10 bilateral shoulder pain upon arrival.  No specific questions or concerns currently.    Pertinent History Pt reports that she has a history of bilateral shoulder/arm pain for the last 2.5 years. No history of trauma. "The pain occurs in the muscles." She struggles with pain when lifting her arms. Pain occasionally wakes  her up at night. She also complains of occasional neck pain and reports that she was previously diagnosed with occipital neuralgia. No back pain and pt denies pain in other joints. Denies any redness, swelling, or warmth in shoulders. She reports that she has tried OTC pain relievers and Voltaren gel without significant relief. Per medical record plain film radiographs of shoulders show mild degenerative changes in the R shoulder and no findings in L shoulder. Cervical radiographs showed DDD and facet joint arthropathy. MD diagnosed her with subacromial bursitis and referred her for physical therapy. Return appointment with MD is in September. Years ago she started taking a cholesterol medication and had full body aches. She stopped and her pain  resolved so now she is on a non-statin cholesterol medication. PMH includes psoriasis, DM, and bilateral TKR (2016).                TREATMENT   Manual Therapy  Gentle left shoulder passive range of motion into flexion, abduction, and external rotation; L shoulder GH A/P mobilizations at neutral grade I-II, 30s/bout x 2 bouts; L shoulder GH inferior mobilizations at 90 abduction grade I-II, 30s/bout x 2 bouts; L shoulder GH A/P mobilizations at 90 abduction and available end range ER, grade I-II, 30s/bout x 2 bouts; L shoulder distraction mobilizations gradually progressing through partial abduction PROM;    Ther-ex  UBE x 4 minutes (2 minutes forward/2 minutes backward) for warm-up during history; Supine bilateral shoulder flexion with cane and manual resistance from therapist 2 x 10; Supine cane chest press with manual resistance from therapist 2 x 10; Standing green tband rows 2 x 10; Standing green tband bicep curls 2 x 10;   Pt educated throughout session about proper posture and technique with exercises. Improved exercise technique, movement at target joints, use of target muscles after min to mod verbal, visual, tactile cues.    Continued passive accessory mobilizations today but focused on L shoulder due to increased aggravation. Strengthening performed for bilateral shoulders and periscapular muscles. Discussed HEP modifications. Will continue progressive manual techniques and strengthening to improve long-term management of shoulder pain.  Patient encouraged to follow-up as scheduled. She will benefit from PT services to address deficits in strength, range of motion, and pain in order to return to full function at home.                     PT Short Term Goals - 08/09/21 1552       PT SHORT TERM GOAL #1   Title Pt will be independent with HEP in order to improve shoulder range of motion and strength as well as decrease pain in order to improve pain-free  function at home.    Baseline --    Time 4    Period Weeks    Status New    Target Date 09/03/21               PT Long Term Goals - 08/09/21 1553       PT LONG TERM GOAL #1   Title Pt will decrease worst bilateral shoulder pain as reported on NPRS by at least 3 points in order to demonstrate clinically significant reduction in shoulder pain.    Baseline 08/06/21: Worst: 9/10    Time 8    Period Weeks    Status New    Target Date 10/01/21      PT LONG TERM GOAL #2   Title Pt will increase FOTO to at  62 least in order to demonstrate significant improvement in function related to bilateral shoulder pain    Baseline 08/06/21: 48    Time 8    Period Weeks    Status New    Target Date 10/01/21      PT LONG TERM GOAL #3   Title Pt will report at least 50% improvement in her symptoms in order to improve pain-free function at home    Time 8    Period Weeks    Status New    Target Date 10/01/21                   Plan - 08/20/21 1108     Clinical Impression Statement Continued passive accessory mobilizations today but focused on L shoulder due to increased aggravation. Strengthening performed for bilateral shoulders and periscapular muscles. Discussed HEP modifications. Will continue progressive manual techniques and strengthening to improve long-term management of shoulder pain.  Patient encouraged to follow-up as scheduled. She will benefit from PT services to address deficits in strength, range of motion, and pain in order to return to full function at home.    Personal Factors and Comorbidities Age;Comorbidity 3+;Past/Current Experience;Time since onset of injury/illness/exacerbation    Comorbidities psoriasis, anxiety, DM    Examination-Activity Limitations Reach Overhead    Examination-Participation Restrictions Meal Prep;Laundry;Cleaning;Shop    Stability/Clinical Decision Making Evolving/Moderate complexity    Rehab Potential Good    PT Frequency 2x / week     PT Duration 8 weeks    PT Treatment/Interventions ADLs/Self Care Home Management;Aquatic Therapy;Biofeedback;Canalith Repostioning;Cryotherapy;Electrical Stimulation;Iontophoresis '4mg'$ /ml Dexamethasone;Moist Heat;Traction;Ultrasound;Gait training;Functional mobility training;Therapeutic activities;Therapeutic exercise;Balance training;Neuromuscular re-education;Manual techniques;Dry needling;Vestibular;Spinal Manipulations;Joint Manipulations;Passive range of motion    PT Next Visit Plan continue manual therapy and strengthening for shoulders    PT Home Exercise Plan Access Code: [email or web address]    Consulted and Agree with Plan of Care Patient             Patient will benefit from skilled therapeutic intervention in order to improve the following deficits and impairments:  Pain, Decreased range of motion, Decreased strength  Visit Diagnosis: Chronic right shoulder pain  Chronic left shoulder pain     Problem List Patient Active Problem List   Diagnosis Date Noted   Acute gout involving toe of right foot 08/12/2021   Chronic pain of both shoulders 08/12/2021   History of TIA (transient ischemic attack) 06/26/2021   Statin intolerance 01/15/2021   Myalgia 12/22/2020   MDD (major depressive disorder), recurrent episode, moderate (HCC) 07/08/2020   Chronic insomnia 07/08/2020   Chronic neck pain 10/05/2019   Chronic diarrhea 03/30/2018   GAD (generalized anxiety disorder) 01/31/2018   Osteopenia 12/08/2017   Vitamin D deficiency 03/04/2016   Allergic rhinitis 03/04/2016   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 [place], [place], [address] Phone: [phone]   Fax:  [phone]  Name: Renee [name] MRN: [id number] Date of Birth: [birth year]

## 2021-08-21 ENCOUNTER — Ambulatory Visit: Payer: Self-pay

## 2021-08-21 ENCOUNTER — Telehealth: Payer: Self-pay | Admitting: *Deleted

## 2021-08-21 ENCOUNTER — Ambulatory Visit (INDEPENDENT_AMBULATORY_CARE_PROVIDER_SITE_OTHER): Payer: Medicare Other | Admitting: Family Medicine

## 2021-08-21 ENCOUNTER — Encounter: Payer: Self-pay | Admitting: Family Medicine

## 2021-08-21 ENCOUNTER — Encounter: Payer: Self-pay | Admitting: Rheumatology

## 2021-08-21 ENCOUNTER — Ambulatory Visit (INDEPENDENT_AMBULATORY_CARE_PROVIDER_SITE_OTHER): Payer: Medicare Other | Admitting: Rheumatology

## 2021-08-21 VITALS — BP 147/85 | HR 75 | Ht 63.0 in | Wt 145.8 lb

## 2021-08-21 DIAGNOSIS — M4004 Postural kyphosis, thoracic region: Secondary | ICD-10-CM

## 2021-08-21 DIAGNOSIS — M79642 Pain in left hand: Secondary | ICD-10-CM | POA: Diagnosis not present

## 2021-08-21 DIAGNOSIS — M79672 Pain in left foot: Secondary | ICD-10-CM

## 2021-08-21 DIAGNOSIS — M25511 Pain in right shoulder: Secondary | ICD-10-CM | POA: Diagnosis not present

## 2021-08-21 DIAGNOSIS — E1169 Type 2 diabetes mellitus with other specified complication: Secondary | ICD-10-CM

## 2021-08-21 DIAGNOSIS — G8929 Other chronic pain: Secondary | ICD-10-CM | POA: Diagnosis not present

## 2021-08-21 DIAGNOSIS — Z8673 Personal history of transient ischemic attack (TIA), and cerebral infarction without residual deficits: Secondary | ICD-10-CM

## 2021-08-21 DIAGNOSIS — M25512 Pain in left shoulder: Secondary | ICD-10-CM | POA: Diagnosis not present

## 2021-08-21 DIAGNOSIS — E1159 Type 2 diabetes mellitus with other circulatory complications: Secondary | ICD-10-CM

## 2021-08-21 DIAGNOSIS — I25118 Atherosclerotic heart disease of native coronary artery with other forms of angina pectoris: Secondary | ICD-10-CM

## 2021-08-21 DIAGNOSIS — E785 Hyperlipidemia, unspecified: Secondary | ICD-10-CM

## 2021-08-21 DIAGNOSIS — F419 Anxiety disorder, unspecified: Secondary | ICD-10-CM

## 2021-08-21 DIAGNOSIS — M79671 Pain in right foot: Secondary | ICD-10-CM

## 2021-08-21 DIAGNOSIS — F32A Depression, unspecified: Secondary | ICD-10-CM

## 2021-08-21 DIAGNOSIS — I152 Hypertension secondary to endocrine disorders: Secondary | ICD-10-CM

## 2021-08-21 DIAGNOSIS — M5030 Other cervical disc degeneration, unspecified cervical region: Secondary | ICD-10-CM

## 2021-08-21 DIAGNOSIS — Z96653 Presence of artificial knee joint, bilateral: Secondary | ICD-10-CM

## 2021-08-21 DIAGNOSIS — E039 Hypothyroidism, unspecified: Secondary | ICD-10-CM

## 2021-08-21 DIAGNOSIS — L409 Psoriasis, unspecified: Secondary | ICD-10-CM | POA: Diagnosis not present

## 2021-08-21 DIAGNOSIS — M62838 Other muscle spasm: Secondary | ICD-10-CM

## 2021-08-21 DIAGNOSIS — M8589 Other specified disorders of bone density and structure, multiple sites: Secondary | ICD-10-CM

## 2021-08-21 DIAGNOSIS — Z789 Other specified health status: Secondary | ICD-10-CM

## 2021-08-21 DIAGNOSIS — M79641 Pain in right hand: Secondary | ICD-10-CM | POA: Diagnosis not present

## 2021-08-21 DIAGNOSIS — E559 Vitamin D deficiency, unspecified: Secondary | ICD-10-CM

## 2021-08-21 DIAGNOSIS — F5104 Psychophysiologic insomnia: Secondary | ICD-10-CM

## 2021-08-21 MED ORDER — LIDOCAINE HCL 1 % IJ SOLN
1.0000 mL | INTRAMUSCULAR | Status: AC | PRN
  Administered 2021-08-21: 1 mL

## 2021-08-21 MED ORDER — TRIAMCINOLONE ACETONIDE 40 MG/ML IJ SUSP
40.0000 mg | INTRAMUSCULAR | Status: AC | PRN
  Administered 2021-08-21: 40 mg via INTRA_ARTICULAR

## 2021-08-21 MED ORDER — TRIAMCINOLONE ACETONIDE 40 MG/ML IJ SUSP
10.0000 mg | INTRAMUSCULAR | Status: AC | PRN
  Administered 2021-08-21: 10 mg via INTRAMUSCULAR

## 2021-08-21 MED ORDER — LIDOCAINE HCL 1 % IJ SOLN
0.5000 mL | INTRAMUSCULAR | Status: AC | PRN
  Administered 2021-08-21: .5 mL

## 2021-08-21 NOTE — Assessment & Plan Note (Addendum)
Improved control on trulicity in addition to low dose sulfonylurea.  Continue both for now given today receive a steroid injection for subacromial bursitis.  If CBGs at baseline in 2 weeks.. can stop glipizide.

## 2021-08-21 NOTE — Assessment & Plan Note (Signed)
Stable, chronic.  Continue current medication.  Losartan 50 mg daily 

## 2021-08-21 NOTE — Patient Instructions (Signed)
Please monitor blood pressure and blood sugar closely after cortisone injection.

## 2021-08-21 NOTE — Progress Notes (Signed)
Patient ID: Renee [name], female    DOB: [birth year], 75 y.o.   MRN: [id number]  This visit was conducted in person.  BP 130/70   Pulse 84   Temp 97.7 F (36.5 C) (Temporal)   Ht _0  (1.626 m)   Wt 146 lb 4 oz (66.3 kg)   SpO2 97%   BMI 25.10 kg/m    CC:  Chief Complaint  Patient presents with   Diabetes    Subjective:   HPI: Renee [name] is a 75 y.o. female presenting on 08/21/2021 for Diabetes  Diabetes:  On trulicity and  glipizide 5 mg daily Lab Results  Component Value Date   HGBA1C 6.6 (H) 08/14/2021  Using medications without difficulties: Hypoglycemic episodes:none Hyperglycemic episodes: none Feet problems: Blood Sugars averaging: 99-126 eye exam within last year:  Wt Readings from Last 3 Encounters:  08/21/21 146 lb 4 oz (66.3 kg)  08/21/21 145 lb 12.8 oz (66.1 kg)  07/29/21 146 lb 9.6 oz (66.5 kg)     Hypertension:    At goal today on losartan 50 mg daily  BP Readings from Last 3 Encounters:  08/21/21 130/70  08/21/21 (!) 147/85  07/29/21 (!) 163/92  Using medication without problems or lightheadedness:  none Chest pain with exertion: none Edema: Short of breath: Average home BPs: Other issues:      Elevated Cholesterol:  Now  much improved almost at goal LDL < 70  On zetia Lab Results  Component Value Date   CHOL 146 08/14/2021   HDL 45.80 08/14/2021   LDLCALC 84 08/14/2021   TRIG 81.0 08/14/2021   CHOLHDL 3 08/14/2021  Using medications without problems: Muscle aches:  Diet compliance: heart healthy Exercise: walking 3-4 days week. Other complaints:  Received steroid injections  r shoulder and trapezius trigger point injection.  this morning with Dr. Estanislado Pandy this AM.  Also doing PT.  Relevant past medical, surgical, family and social history reviewed and updated as indicated. Interim medical history since our last visit reviewed. Allergies and medications reviewed and updated. Outpatient Medications Prior to Visit   Medication Sig Dispense Refill   acetaminophen (TYLENOL) 650 MG CR tablet Take 650 mg by mouth every 8 (eight) hours as needed for pain.     aspirin EC 81 MG tablet Take by mouth daily.     Blood Glucose Monitoring Suppl (ONETOUCH VERIO) w/Device KIT by Does not apply route. Use to check fasting blood sugar daily and an occasional 2 hr postprandial     Carboxymethylcellulose Sodium (THERATEARS OP) Place 1 drop into both eyes at bedtime as needed.     clobetasol ointment (TEMOVATE) 5.37 % Apply 1 application topically daily as needed (psoriasis flares). Please given in jars instead of tubes 45 g 1   clobetasol ointment (TEMOVATE) 0.05 % Apply to affected area every night for 4 weeks, then every other day for 4 weeks and then twice a week for 4 weeks or until resolution. 60 g 5   Dulaglutide (TRULICITY) 4.82 LM/7.8ML SOPN Inject into the skin once a week.     Emollient (CERAVE) CREA Apply 1 application topically daily as needed (psoriasis).     ezetimibe (ZETIA) 10 MG tablet Take 1 tablet (10 mg total) by mouth daily. 90 tablet 3   famotidine (PEPCID) 20 MG tablet Take 20 mg by mouth daily as needed for heartburn or indigestion.     Flaxseed, Linseed, (FLAX SEED OIL PO) Take 1 capsule by mouth daily.  fluticasone (FLONASE) 50 MCG/ACT nasal spray Place 1 spray into both nostrils daily as needed for allergies.     glipiZIDE (GLUCOTROL XL) 5 MG 24 hr tablet TAKE 1 TABLET BY MOUTH EVERY DAY WITH BREAKFAST 90 tablet 1   glucose blood (ONETOUCH VERIO) test strip Use to check fasting blood sugar daily and an occasional 2 hr postprandial. 100 each 11   ibuprofen (ADVIL,MOTRIN) 200 MG tablet Take 600-800 mg by mouth every 6 (six) hours as needed for moderate pain.     Lancets (ONETOUCH ULTRASOFT) lancets Use to check fasting blood sugar daily and an occasional 2 hr postprandial 100 each 11   levothyroxine (SYNTHROID) 75 MCG tablet TAKE 1 TABLET DAILY 90 tablet 3   losartan (COZAAR) 50 MG tablet Take 1  tablet (50 mg total) by mouth daily. 90 tablet 3   MAGNESIUM PO Take by mouth daily.     Multiple Vitamin (MULTIVITAMIN) tablet Take 1 tablet by mouth every other day.      nitroGLYCERIN (NITROSTAT) 0.4 MG SL tablet Place 1 tablet (0.4 mg total) under the tongue every 5 (five) minutes as needed. 30 tablet 6   Skin Protectants, Misc. (EUCERIN) cream Apply 1 application topically See admin instructions. Eczema relief applies to arms and hands daily     triamcinolone cream (KENALOG) 0.1 % Apply 1 application topically 2 (two) times daily as needed (psoriasis).     Vitamin D, Cholecalciferol, 1000 units TABS Take 2,000 Units by mouth daily.     No facility-administered medications prior to visit.     Per HPI unless specifically indicated in ROS section below Review of Systems  Constitutional:  Negative for fatigue and fever.  HENT:  Negative for ear pain.   Eyes:  Negative for pain.  Respiratory:  Negative for chest tightness and shortness of breath.   Cardiovascular:  Negative for chest pain, palpitations and leg swelling.  Gastrointestinal:  Negative for abdominal pain.  Genitourinary:  Negative for dysuria.  Objective:  BP 130/70   Pulse 84   Temp 97.7 F (36.5 C) (Temporal)   Ht _0  (1.626 m)   Wt 146 lb 4 oz (66.3 kg)   SpO2 97%   BMI 25.10 kg/m   Wt Readings from Last 3 Encounters:  08/21/21 146 lb 4 oz (66.3 kg)  08/21/21 145 lb 12.8 oz (66.1 kg)  07/29/21 146 lb 9.6 oz (66.5 kg)      Physical Exam Vitals and nursing note reviewed.  Constitutional:      General: She is not in acute distress.    Appearance: Normal appearance. She is well-developed. She is not ill-appearing or toxic-appearing.  HENT:     Head: Normocephalic.     Right Ear: Hearing, tympanic membrane, ear canal and external ear normal.     Left Ear: Hearing, tympanic membrane, ear canal and external ear normal.     Nose: Nose normal.  Eyes:     General: Lids are normal. Lids are everted, no foreign  bodies appreciated.     Conjunctiva/sclera: Conjunctivae normal.     Pupils: Pupils are equal, round, and reactive to light.  Neck:     Thyroid: No thyroid mass or thyromegaly.     Vascular: No carotid bruit.     Trachea: Trachea normal.  Cardiovascular:     Rate and Rhythm: Normal rate and regular rhythm.     Heart sounds: Normal heart sounds, S1 normal and S2 normal. No murmur heard.   No gallop.  Pulmonary:     Effort: Pulmonary effort is normal. No respiratory distress.     Breath sounds: Normal breath sounds. No wheezing, rhonchi or rales.  Abdominal:     General: Bowel sounds are normal. There is no distension or abdominal bruit.     Palpations: Abdomen is soft. There is no fluid wave or mass.     Tenderness: There is no abdominal tenderness. There is no guarding or rebound.     Hernia: No hernia is present.  Musculoskeletal:     Cervical back: Normal range of motion and neck supple.  Lymphadenopathy:     Cervical: No cervical adenopathy.  Skin:    General: Skin is warm and dry.     Findings: No rash.  Neurological:     Mental Status: She is alert.     Cranial Nerves: No cranial nerve deficit.     Sensory: No sensory deficit.  Psychiatric:        Mood and Affect: Mood is not anxious or depressed.        Speech: Speech normal.        Behavior: Behavior normal. Behavior is cooperative.        Judgment: Judgment normal.      Results for orders placed or performed in visit on 08/14/21  Comprehensive metabolic panel  Result Value Ref Range   Sodium 141 135 - 145 mEq/L   Potassium 4.1 3.5 - 5.1 mEq/L   Chloride 106 96 - 112 mEq/L   CO2 27 19 - 32 mEq/L   Glucose, Bld 115 (H) 70 - 99 mg/dL   BUN 21 6 - 23 mg/dL   Creatinine, Ser 0.68 0.40 - 1.20 mg/dL   Total Bilirubin 0.4 0.2 - 1.2 mg/dL   Alkaline Phosphatase 59 39 - 117 U/L   AST 21 0 - 37 U/L   ALT 19 0 - 35 U/L   Total Protein 7.2 6.0 - 8.3 g/dL   Albumin 4.2 3.5 - 5.2 g/dL   GFR 84.28 >60.00 mL/min   Calcium  9.6 8.4 - 10.5 mg/dL  Lipid panel  Result Value Ref Range   Cholesterol 146 0 - 200 mg/dL   Triglycerides 81.0 0.0 - 149.0 mg/dL   HDL 45.80 >39.00 mg/dL   VLDL 16.2 0.0 - 40.0 mg/dL   LDL Cholesterol 84 0 - 99 mg/dL   Total CHOL/HDL Ratio 3    NonHDL 100.66   Hemoglobin A1c  Result Value Ref Range   Hgb A1c MFr Bld 6.6 (H) 4.6 - 6.5 %    This visit occurred during the SARS-CoV-2 public health emergency.  Safety protocols were in place, including screening questions prior to the visit, additional usage of staff PPE, and extensive cleaning of exam room while observing appropriate contact time as indicated for disinfecting solutions.   COVID 19 screen:  No recent travel or known exposure to COVID19 The patient denies respiratory symptoms of COVID 19 at this time. The importance of social distancing was discussed today.   Assessment and Plan    Problem List Items Addressed This Visit     Chronic pain of both shoulders    Treated by rheum.  given steroid injection and trigger point injection today.      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  Continue both for now given today receive a steroid injection for subacromial bursitis.  If CBGs  at baseline in 2 weeks.. can stop glipizide.       Hyperlipidemia associated with type 2 diabetes mellitus (HCC)    LDL almost at goal LDL <70. Continue work on diet changes and re-eval in 3 months.      Hypertension associated with diabetes (HCC)    Stable, chronic.  Continue current medication.   Losartan 50 mg daily        Eliezer Lofts, MD

## 2021-08-21 NOTE — Patient Instructions (Addendum)
Stop glipizide in next 1-2 week once steroid is out system.. Continue Trulicity weekly.  Continue daily fasting blood sugars.  Continue zetia for now.

## 2021-08-21 NOTE — Telephone Encounter (Signed)
I called patient with x-rays results.

## 2021-08-21 NOTE — Assessment & Plan Note (Signed)
LDL almost at goal LDL <70. Continue work on diet changes and re-eval in 3 months.

## 2021-08-21 NOTE — Assessment & Plan Note (Signed)
Treated by rheum.  given steroid injection and trigger point injection today.

## 2021-08-24 NOTE — Assessment & Plan Note (Signed)
Continue Gluctrol Xl 5 mg daily... start Trulicity weekly. Check fasting blood sugar daily. Occ postprandial checks. Plan wean off glucotrol over time.

## 2021-08-24 NOTE — Assessment & Plan Note (Signed)
Follow BPat home... goal < 130/90. Call with measurement in 1-2 weeks. If above goal.. we will plan on increasing the losartan to 100 mg daily.

## 2021-08-25 ENCOUNTER — Ambulatory Visit: Payer: Medicare Other

## 2021-08-25 ENCOUNTER — Other Ambulatory Visit: Payer: Self-pay

## 2021-08-25 DIAGNOSIS — M6281 Muscle weakness (generalized): Secondary | ICD-10-CM

## 2021-08-25 DIAGNOSIS — G8929 Other chronic pain: Secondary | ICD-10-CM | POA: Diagnosis not present

## 2021-08-25 DIAGNOSIS — M25512 Pain in left shoulder: Secondary | ICD-10-CM | POA: Diagnosis not present

## 2021-08-25 DIAGNOSIS — M25511 Pain in right shoulder: Secondary | ICD-10-CM | POA: Diagnosis not present

## 2021-08-25 NOTE — Therapy (Signed)
[facility] [address] [place], [place], [address] Phone: [phone]   Fax:  [phone]  Physical Therapy Treatment  Patient Details  Name: Renee [name] MRN: [id number] Date of Birth: 05/10/1946 Referring Provider (PT): Bo Merino   Encounter Date: 08/25/2021   PT End of Session - 08/25/21 1546     Visit Number 5    Number of Visits 17    Date for PT Re-Evaluation 10/01/21    Authorization Type eval: 08/06/21    PT Start Time 1530    PT Stop Time 1615    PT Time Calculation (min) 45 min    Activity Tolerance Patient tolerated treatment well    Behavior During Therapy [facility] for tasks assessed/performed             Past Medical History:  Diagnosis Date   Allergy    Anxiety    Arthritis    CAD (coronary artery disease)    non-obstructive. cath 11/11   Diabetes mellitus    borderline diabetic - diet controlled    History of chicken pox    History of phobia    clastrophobia   Hyperlipidemia    Hypertension    Hypothyroidism    Kidney stones    hx of    Lichen sclerosus    Psoriasis    Stroke ([place])    Urinary tract infection    hx of     Past Surgical History:  Procedure Laterality Date   APPENDECTOMY     CARDIAC CATHETERIZATION     2011   CARPAL TUNNEL RELEASE     Left trigger finger   CATARACT EXTRACTION W/PHACO Left 06/06/2018   Procedure: CATARACT EXTRACTION PHACO AND INTRAOCULAR LENS PLACEMENT ([place]);  Surgeon: Birder Robson, MD;  Location: [facility] ORS;  Service: Ophthalmology;  Laterality: Left;  [place]  00:23 AP% 16.0 CDE 3.69 Fluid pack lot # [id number]   CATARACT EXTRACTION W/PHACO Right 09/19/2018   Procedure: CATARACT EXTRACTION PHACO AND INTRAOCULAR LENS PLACEMENT (I[facility]);  Surgeon: Birder Robson, MD;  Location: [facility] ORS;  Service: Ophthalmology;  Laterality: Right;  [place] 00:29.5 AP% 17.0 CDE$ 5.03 Fluid pack lot # [id number]   COLONOSCOPY WITH PROPOFOL N/A 05/13/2017   Procedure: COLONOSCOPY  WITH PROPOFOL;  Surgeon: Manya Silvas, MD;  Location: [facility]COPY;  Service: Endoscopy;  Laterality: N/A;   ESOPHAGOGASTRODUODENOSCOPY (EGD) WITH PROPOFOL N/A 01/04/2020   Procedure: ESOPHAGOGASTRODUODENOSCOPY (EGD) WITH PROPOFOL;  Surgeon: [name], MD;  Location: [facility] ENDOSCOPY;  Service: Gastroenterology;  Laterality: N/A;   EYE SURGERY  2004   lasik - right eye    JOINT REPLACEMENT     KNEE SURGERY Right 2015   arthroscopy   TOTAL KNEE ARTHROPLASTY Bilateral 06/12/2015   Procedure: TOTAL KNEE BILATERAL;  Surgeon: Gaynelle Arabian, MD;  Location: [facility] ORS;  Service: Orthopedics;  Laterality: Bilateral;  and epidural   TUBAL LIGATION     Tubes tied     and untied. and tied again    There were no vitals filed for this visit.   Subjective Assessment - 08/25/21 1537     Subjective Patient reports she is doing well today. She saw Dr. Estanislado Pandy on 08/21/21 who gave her a steroid injection in her R subacromial space and a trigger point injection in her left upper trap. She has not noted significant improvement in her shoulder pain on either side since that time. Denies any resting pain upon arrival today.  No specific questions or concerns currently.  Pertinent History Pt reports that she has a history of bilateral shoulder/arm pain for the last 2.5 years. No history of trauma. "The pain occurs in the muscles." She struggles with pain when lifting her arms. Pain occasionally wakes her up at night. She also complains of occasional neck pain and reports that she was previously diagnosed with occipital neuralgia. No back pain and pt denies pain in other joints. Denies any redness, swelling, or warmth in shoulders. She reports that she has tried OTC pain relievers and Voltaren gel without significant relief. Per medical record plain film radiographs of shoulders show mild degenerative changes in the R shoulder and no findings in L shoulder. Cervical radiographs showed DDD and facet joint arthropathy.  MD diagnosed her with subacromial bursitis and referred her for physical therapy. Return appointment with MD is in September. Years ago she started taking a cholesterol medication and had full body aches. She stopped and her pain resolved so now she is on a non-statin cholesterol medication. PMH includes psoriasis, DM, and bilateral TKR (2016).                TREATMENT   Ther-ex  UBE x 5 minutes (2.5 minutes forward/2.5 minutes backward) for warm-up during history; Nautilus lat pull down 30# x 10, 40# 2 x 10; Nautilus rows 30# x 10; 40# 2 x 10; Nautilus chest press 30# 3 x 10; Seated bilateral shoulder flexion to 90 degrees with 2# dumbbells (DB) 2 x 10; Seated bilateral shoulder scaption to 90 degrees with 2# DB 2 x 10; Seated bilateral bicep curls with 2# DB 2 x 10;   Pt educated throughout session about proper posture and technique with exercises. Improved exercise technique, movement at target joints, use of target muscles after min to mod verbal, visual, tactile cues.    Given that patient denies any pain in her shoulders upon arrival today session focused on strengthening.  Incorporated Nautilus machine to add resistance to her strengthening.  Patient reports mild increase in right shoulder pain during lat pull downs but otherwise denies pain with additional exercises. She does report increase in bilateral shoulder fatigue as session progresses. Patient encouraged to follow-up as scheduled. She will benefit from PT services to address deficits in strength, range of motion, and pain in order to return to full function at home.                     PT Short Term Goals - 08/09/21 1552       PT SHORT TERM GOAL #1   Title Pt will be independent with HEP in order to improve shoulder range of motion and strength as well as decrease pain in order to improve pain-free function at home.    Baseline --    Time 4    Period Weeks    Status New    Target Date 09/03/21                PT Long Term Goals - 08/09/21 1553       PT LONG TERM GOAL #1   Title Pt will decrease worst bilateral shoulder pain as reported on NPRS by at least 3 points in order to demonstrate clinically significant reduction in shoulder pain.    Baseline 08/06/21: Worst: 9/10    Time 8    Period Weeks    Status New    Target Date 10/01/21      PT LONG TERM GOAL #2   Title Pt will  increase FOTO to at 62 least in order to demonstrate significant improvement in function related to bilateral shoulder pain    Baseline 08/06/21: 48    Time 8    Period Weeks    Status New    Target Date 10/01/21      PT LONG TERM GOAL #3   Title Pt will report at least 50% improvement in her symptoms in order to improve pain-free function at home    Time 8    Period Weeks    Status New    Target Date 10/01/21                   Plan - 08/25/21 1547     Clinical Impression Statement Given that patient denies any pain in her shoulders upon arrival today session focused on strengthening.  Incorporated Nautilus machine to add resistance to her strengthening.  Patient reports mild increase in right shoulder pain during lat pull downs but otherwise denies pain with additional exercises. She does report increase in bilateral shoulder fatigue as session progresses. Patient encouraged to follow-up as scheduled. She will benefit from PT services to address deficits in strength, range of motion, and pain in order to return to full function at home.    Personal Factors and Comorbidities Age;Comorbidity 3+;Past/Current Experience;Time since onset of injury/illness/exacerbation    Comorbidities psoriasis, anxiety, DM    Examination-Activity Limitations Reach Overhead    Examination-Participation Restrictions Meal Prep;Laundry;Cleaning;Shop    Stability/Clinical Decision Making Evolving/Moderate complexity    Rehab Potential Good    PT Frequency 2x / week    PT Duration 8 weeks    PT  Treatment/Interventions ADLs/Self Care Home Management;Aquatic Therapy;Biofeedback;Canalith Repostioning;Cryotherapy;Electrical Stimulation;Iontophoresis '4mg'$ /ml Dexamethasone;Moist Heat;Traction;Ultrasound;Gait training;Functional mobility training;Therapeutic activities;Therapeutic exercise;Balance training;Neuromuscular re-education;Manual techniques;Dry needling;Vestibular;Spinal Manipulations;Joint Manipulations;Passive range of motion    PT Next Visit Plan continue manual therapy and strengthening for shoulders    PT Home Exercise Plan Access Code: [email or web address]    Consulted and Agree with Plan of Care Patient             Patient will benefit from skilled therapeutic intervention in order to improve the following deficits and impairments:  Pain, Decreased range of motion, Decreased strength  Visit Diagnosis: Chronic right shoulder pain  Chronic left shoulder pain  Muscle weakness (generalized)     Problem List Patient Active Problem List   Diagnosis Date Noted   Acute gout involving toe of right foot 08/12/2021   Chronic pain of both shoulders 08/12/2021   History of TIA (transient ischemic attack) 06/26/2021   Statin intolerance 01/15/2021   Myalgia 12/22/2020   MDD (major depressive disorder), recurrent episode, moderate (HCC) 07/08/2020   Chronic insomnia 07/08/2020   Chronic neck pain 10/05/2019   Chronic diarrhea 03/30/2018   GAD (generalized anxiety disorder) 01/31/2018   Osteopenia 12/08/2017   Vitamin D deficiency 03/04/2016   Allergic rhinitis 03/04/2016   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 [place], [place], [address] Phone: [phone]   Fax:  [phone]  Name: AMARIE GAISER MRN: [id number] Date of Birth: [birth year]

## 2021-08-27 ENCOUNTER — Other Ambulatory Visit: Payer: Self-pay

## 2021-08-27 ENCOUNTER — Ambulatory Visit: Payer: Medicare Other | Attending: Rheumatology

## 2021-08-27 DIAGNOSIS — M25511 Pain in right shoulder: Secondary | ICD-10-CM | POA: Insufficient documentation

## 2021-08-27 DIAGNOSIS — G8929 Other chronic pain: Secondary | ICD-10-CM | POA: Diagnosis not present

## 2021-08-27 DIAGNOSIS — M6281 Muscle weakness (generalized): Secondary | ICD-10-CM | POA: Diagnosis not present

## 2021-08-27 DIAGNOSIS — M25512 Pain in left shoulder: Secondary | ICD-10-CM | POA: Insufficient documentation

## 2021-08-27 NOTE — Therapy (Signed)
[facility] [address] [place], [place], [address] Phone: [phone]   Fax:  [phone]  Physical Therapy Treatment  Patient Details  Name: Renee [name] MRN: [id number] Date of Birth: 03-22-1946 Referring Provider (PT): Bo Merino   Encounter Date: 08/27/2021   PT End of Session - 08/27/21 1528     Visit Number 6    Number of Visits 17    Date for PT Re-Evaluation 10/01/21    Authorization Type eval: 08/06/21    PT Start Time 1530    PT Stop Time 1615    PT Time Calculation (min) 45 min    Activity Tolerance Patient tolerated treatment well    Behavior During Therapy [facility] for tasks assessed/performed             Past Medical History:  Diagnosis Date   Allergy    Anxiety    Arthritis    CAD (coronary artery disease)    non-obstructive. cath 11/11   Diabetes mellitus    borderline diabetic - diet controlled    History of chicken pox    History of phobia    clastrophobia   Hyperlipidemia    Hypertension    Hypothyroidism    Kidney stones    hx of    Lichen sclerosus    Psoriasis    Stroke ([place])    Urinary tract infection    hx of     Past Surgical History:  Procedure Laterality Date   APPENDECTOMY     CARDIAC CATHETERIZATION     2011   CARPAL TUNNEL RELEASE     Left trigger finger   CATARACT EXTRACTION W/PHACO Left 06/06/2018   Procedure: CATARACT EXTRACTION PHACO AND INTRAOCULAR LENS PLACEMENT ([place]);  Surgeon: Birder Robson, MD;  Location: [facility] ORS;  Service: Ophthalmology;  Laterality: Left;  [place]  00:23 AP% 16.0 CDE 3.69 Fluid pack lot # [id number]   CATARACT EXTRACTION W/PHACO Right 09/19/2018   Procedure: CATARACT EXTRACTION PHACO AND INTRAOCULAR LENS PLACEMENT (I[place]);  Surgeon: Birder Robson, MD;  Location: [facility] ORS;  Service: Ophthalmology;  Laterality: Right;  [place] 00:29.5 AP% 17.0 CDE$ 5.03 Fluid pack lot # [id number]   COLONOSCOPY WITH PROPOFOL N/A 05/13/2017   Procedure: COLONOSCOPY  WITH PROPOFOL;  Surgeon: Manya Silvas, MD;  Location: [facility] ENDOSCOPY;  Service: Endoscopy;  Laterality: N/A;   ESOPHAGOGASTRODUODENOSCOPY (EGD) WITH PROPOFOL N/A 01/04/2020   Procedure: ESOPHAGOGASTRODUODENOSCOPY (EGD) WITH PROPOFOL;  Surgeon: [name], MD;  Location: [facility] ENDOSCOPY;  Service: Gastroenterology;  Laterality: N/A;   EYE SURGERY  2004   lasik - right eye    JOINT REPLACEMENT     KNEE SURGERY Right 2015   arthroscopy   TOTAL KNEE ARTHROPLASTY Bilateral 06/12/2015   Procedure: TOTAL KNEE BILATERAL;  Surgeon: Gaynelle Arabian, MD;  Location: [facility] ORS;  Service: Orthopedics;  Laterality: Bilateral;  and epidural   TUBAL LIGATION     Tubes tied     and untied. and tied again    There were no vitals filed for this visit.   Subjective Assessment - 08/27/21 1528     Subjective Patient reports she is doing well today.  She reports soreness after last session as well as yesterday however denies any resting bilateral shoulder pain upon arrival today.  Reports compliance with HEP.  No specific questions or concerns currently    Pertinent History Pt reports that she has a history of bilateral shoulder/arm pain for the last 2.5 years. No history of  trauma. "The pain occurs in the muscles." She struggles with pain when lifting her arms. Pain occasionally wakes her up at night. She also complains of occasional neck pain and reports that she was previously diagnosed with occipital neuralgia. No back pain and pt denies pain in other joints. Denies any redness, swelling, or warmth in shoulders. She reports that she has tried OTC pain relievers and Voltaren gel without significant relief. Per medical record plain film radiographs of shoulders show mild degenerative changes in the R shoulder and no findings in L shoulder. Cervical radiographs showed DDD and facet joint arthropathy. MD diagnosed her with subacromial bursitis and referred her for physical therapy. Return appointment with MD is in  September. Years ago she started taking a cholesterol medication and had full body aches. She stopped and her pain resolved so now she is on a non-statin cholesterol medication. PMH includes psoriasis, DM, and bilateral TKR (2016).                TREATMENT   Ther-ex  UBE x 5 minutes (2.5 minutes forward/2.5 minutes backward) for warm-up during history; Nautilus lat pull down 40# 3 x 10; Nautilus rows 40# 3 x 10; Nautilus chest press 30# 3 x 10; Seated bilateral shoulder flexion to 90 degrees with 2# dumbbells (DB) 2 x 10; Seated bilateral shoulder scaption to 90 degrees with 2# DB 2 x 10; Standing doorway pec stretch 30s hold x 3; Ball rolls on wall CW/CCW x 10 each direction bilaterally;   Pt educated throughout session about proper posture and technique with exercises. Improved exercise technique, movement at target joints, use of target muscles after min to mod verbal, visual, tactile cues.    Given that patient denies any pain in her shoulders upon arrival session focused on strengthening again today.  Continued with Nautilus machine to add resistance to her strengthening.  Increased resistance with a variety of exercises to provide additional challenge. She does report increase in bilateral shoulder fatigue as session progresses and adequate rest breaks provided between repetitions.  No HEP progression on this date. Patient encouraged to follow-up as scheduled. She will benefit from PT services to address deficits in strength, range of motion, and pain in order to return to full function at home.                     PT Short Term Goals - 08/09/21 1552       PT SHORT TERM GOAL #1   Title Pt will be independent with HEP in order to improve shoulder range of motion and strength as well as decrease pain in order to improve pain-free function at home.    Baseline --    Time 4    Period Weeks    Status New    Target Date 09/03/21               PT Long  Term Goals - 08/09/21 1553       PT LONG TERM GOAL #1   Title Pt will decrease worst bilateral shoulder pain as reported on NPRS by at least 3 points in order to demonstrate clinically significant reduction in shoulder pain.    Baseline 08/06/21: Worst: 9/10    Time 8    Period Weeks    Status New    Target Date 10/01/21      PT LONG TERM GOAL #2   Title Pt will increase FOTO to at 62 least in order to demonstrate significant  improvement in function related to bilateral shoulder pain    Baseline 08/06/21: 48    Time 8    Period Weeks    Status New    Target Date 10/01/21      PT LONG TERM GOAL #3   Title Pt will report at least 50% improvement in her symptoms in order to improve pain-free function at home    Time 8    Period Weeks    Status New    Target Date 10/01/21                   Plan - 08/27/21 1528     Clinical Impression Statement Given that patient denies any pain in her shoulders upon arrival session focused on strengthening again today.  Continued with Nautilus machine to add resistance to her strengthening.  Increased resistance with a variety of exercises to provide additional challenge. She does report increase in bilateral shoulder fatigue as session progresses and adequate rest breaks provided between repetitions.  No HEP progression on this date. Patient encouraged to follow-up as scheduled. She will benefit from PT services to address deficits in strength, range of motion, and pain in order to return to full function at home.    Personal Factors and Comorbidities Age;Comorbidity 3+;Past/Current Experience;Time since onset of injury/illness/exacerbation    Comorbidities psoriasis, anxiety, DM    Examination-Activity Limitations Reach Overhead    Examination-Participation Restrictions Meal Prep;Laundry;Cleaning;Shop    Stability/Clinical Decision Making Evolving/Moderate complexity    Rehab Potential Good    PT Frequency 2x / week    PT Duration 8 weeks     PT Treatment/Interventions ADLs/Self Care Home Management;Aquatic Therapy;Biofeedback;Canalith Repostioning;Cryotherapy;Electrical Stimulation;Iontophoresis '4mg'$ /ml Dexamethasone;Moist Heat;Traction;Ultrasound;Gait training;Functional mobility training;Therapeutic activities;Therapeutic exercise;Balance training;Neuromuscular re-education;Manual techniques;Dry needling;Vestibular;Spinal Manipulations;Joint Manipulations;Passive range of motion    PT Next Visit Plan continue manual therapy and strengthening for shoulders    PT Home Exercise Plan Access Code: [email or web address]    Consulted and Agree with Plan of Care Patient             Patient will benefit from skilled therapeutic intervention in order to improve the following deficits and impairments:  Pain, Decreased range of motion, Decreased strength  Visit Diagnosis: Chronic right shoulder pain  Chronic left shoulder pain  Muscle weakness (generalized)     Problem List Patient Active Problem List   Diagnosis Date Noted   Acute gout involving toe of right foot 08/12/2021   Chronic pain of both shoulders 08/12/2021   History of TIA (transient ischemic attack) 06/26/2021   Statin intolerance 01/15/2021   Myalgia 12/22/2020   MDD (major depressive disorder), recurrent episode, moderate (HCC) 07/08/2020   Chronic insomnia 07/08/2020   Chronic neck pain 10/05/2019   Chronic diarrhea 03/30/2018   GAD (generalized anxiety disorder) 01/31/2018   Osteopenia 12/08/2017   Vitamin D deficiency 03/04/2016   Allergic rhinitis 03/04/2016   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 [place], [place], [address] Phone: [phone]   Fax:  [phone]  Name:  Renee [name] MRN: [id number] Date of Birth: May 27, 1946

## 2021-09-01 ENCOUNTER — Ambulatory Visit: Payer: Medicare Other

## 2021-09-01 ENCOUNTER — Other Ambulatory Visit: Payer: Self-pay

## 2021-09-01 DIAGNOSIS — M6281 Muscle weakness (generalized): Secondary | ICD-10-CM

## 2021-09-01 DIAGNOSIS — G8929 Other chronic pain: Secondary | ICD-10-CM

## 2021-09-01 DIAGNOSIS — M25511 Pain in right shoulder: Secondary | ICD-10-CM | POA: Diagnosis not present

## 2021-09-01 DIAGNOSIS — M25512 Pain in left shoulder: Secondary | ICD-10-CM | POA: Diagnosis not present

## 2021-09-01 NOTE — Therapy (Signed)
[facility] [address] [place], [place], [address] Phone: [phone]   Fax:  [phone]  Physical Therapy Treatment  Patient Details  Name: Renee [name] MRN: [id number] Date of Birth: Jan 01, 1946 Referring Provider (PT): Bo Merino   Encounter Date: 09/01/2021   PT End of Session - 09/01/21 1552     Visit Number 7    Number of Visits 17    Date for PT Re-Evaluation 10/01/21    Authorization Type eval: 08/06/21    PT Start Time 1530    PT Stop Time 1615    PT Time Calculation (min) 45 min    Activity Tolerance Patient tolerated treatment well    Behavior During Therapy [facility] for tasks assessed/performed             Past Medical History:  Diagnosis Date   Allergy    Anxiety    Arthritis    CAD (coronary artery disease)    non-obstructive. cath 11/11   Diabetes mellitus    borderline diabetic - diet controlled    History of chicken pox    History of phobia    clastrophobia   Hyperlipidemia    Hypertension    Hypothyroidism    Kidney stones    hx of    Lichen sclerosus    Psoriasis    Stroke ([place])    Urinary tract infection    hx of     Past Surgical History:  Procedure Laterality Date   APPENDECTOMY     CARDIAC CATHETERIZATION     2011   CARPAL TUNNEL RELEASE     Left trigger finger   CATARACT EXTRACTION W/PHACO Left 06/06/2018   Procedure: CATARACT EXTRACTION PHACO AND INTRAOCULAR LENS PLACEMENT ([place]);  Surgeon: Birder Robson, MD;  Location: [facility] ORS;  Service: Ophthalmology;  Laterality: Left;  [place]  00:23 AP% 16.0 CDE 3.69 Fluid pack lot # [id number]   CATARACT EXTRACTION W/PHACO Right 09/19/2018   Procedure: CATARACT EXTRACTION PHACO AND INTRAOCULAR LENS PLACEMENT ([facility]);  Surgeon: Birder Robson, MD;  Location: [facility] ORS;  Service: Ophthalmology;  Laterality: Right;  [place] 00:29.5 AP% 17.0 CDE$ 5.03 Fluid pack lot # [id number]   COLONOSCOPY WITH PROPOFOL N/A 05/13/2017   Procedure: COLONOSCOPY  WITH PROPOFOL;  Surgeon: Manya Silvas, MD;  Location: [facility] ENDOSCOPY;  Service: Endoscopy;  Laterality: N/A;   ESOPHAGOGASTRODUODENOSCOPY (EGD) WITH PROPOFOL N/A 01/04/2020   Procedure: ESOPHAGOGASTRODUODENOSCOPY (EGD) WITH PROPOFOL;  Surgeon: [name], MD;  Location: [facility] END[facility]COPY;  Service: Gastroenterology;  Laterality: N/A;   EYE SURGERY  2004   lasik - right eye    JOINT REPLACEMENT     KNEE SURGERY Right 2015   arthroscopy   TOTAL KNEE ARTHROPLASTY Bilateral 06/12/2015   Procedure: TOTAL KNEE BILATERAL;  Surgeon: Gaynelle Arabian, MD;  Location: [facility] ORS;  Service: Orthopedics;  Laterality: Bilateral;  and epidural   TUBAL LIGATION     Tubes tied     and untied. and tied again    There were no vitals filed for this visit.   Subjective Assessment - 09/01/21 1551     Subjective Patient reports she is doing well today. No L shoulder pain currently but her R shoulder is very irritated. She rates the R shoulder pain as 8/10 upon arrival. Mild soreness after the last therapy session.  Reports compliance with HEP.  No specific questions or concerns currently    Pertinent History Pt reports that she has a history of bilateral shoulder/arm pain  for the last 2.5 years. No history of trauma. "The pain occurs in the muscles." She struggles with pain when lifting her arms. Pain occasionally wakes her up at night. She also complains of occasional neck pain and reports that she was previously diagnosed with occipital neuralgia. No back pain and pt denies pain in other joints. Denies any redness, swelling, or warmth in shoulders. She reports that she has tried OTC pain relievers and Voltaren gel without significant relief. Per medical record plain film radiographs of shoulders show mild degenerative changes in the R shoulder and no findings in L shoulder. Cervical radiographs showed DDD and facet joint arthropathy. MD diagnosed her with subacromial bursitis and referred her for physical therapy. Return  appointment with MD is in September. Years ago she started taking a cholesterol medication and had full body aches. She stopped and her pain resolved so now she is on a non-statin cholesterol medication. PMH includes psoriasis, DM, and bilateral TKR (2016).                TREATMENT   Ther-ex  UBE x 4 minutes (2 minutes forward/2 minutes backward) for warm-up during history; Supine R shoulder flexion with 3# dumbbell (DB) 2 x 10; Supine R shoulder serratus punch with manual resistance 2 x 10; Supine R shoulder rhythmic stabilization with resistance at wrist 30s x 2; L sidelying R shoulder ER with elbow at side and 3# DB 2 x 10; L sidelying R shoulder abduction with 3# DB 2 x 10; Nautilus lat pull down 50# 2 x 10; Nautilus rows 40# 2 x 10;   Manual Therapy  Gentle right shoulder passive range of motion into flexion, abduction, and external rotation; Supine R shoulder GH A/P mobilizations at neutral grade I-II, 30s/bout x 2 bouts; Supine R shoulder GH inferior mobilizations at 90 abduction grade I-II, 30s/bout x 2 bouts; Supine R shoulder GH A/P mobilizations at 90 abduction and available end range ER, grade I-II, 30s/bout x 2 bouts; Supine R shoulder distraction mobilizations gradually progressing through partial abduction PROM;    Pt educated throughout session about proper posture and technique with exercises. Improved exercise technique, movement at target joints, use of target muscles after min to mod verbal, visual, tactile cues.    Pt arrives with excellent motivation to participate in therapy.  Utilized manual techniques for right shoulder due to increased aggravation upon arrival today.  Continued with progressive loading of right shoulder during session for long-term management of pain.  No HEP progression on this date. Patient encouraged to follow-up as scheduled. She will benefit from PT services to address deficits in strength, range of motion, and pain in order to  return to full function at home.                     PT Short Term Goals - 08/09/21 1552       PT SHORT TERM GOAL #1   Title Pt will be independent with HEP in order to improve shoulder range of motion and strength as well as decrease pain in order to improve pain-free function at home.    Baseline --    Time 4    Period Weeks    Status New    Target Date 09/03/21               PT Long Term Goals - 08/09/21 1553       PT LONG TERM GOAL #1   Title Pt will decrease worst bilateral  shoulder pain as reported on NPRS by at least 3 points in order to demonstrate clinically significant reduction in shoulder pain.    Baseline 08/06/21: Worst: 9/10    Time 8    Period Weeks    Status New    Target Date 10/01/21      PT LONG TERM GOAL #2   Title Pt will increase FOTO to at 62 least in order to demonstrate significant improvement in function related to bilateral shoulder pain    Baseline 08/06/21: 48    Time 8    Period Weeks    Status New    Target Date 10/01/21      PT LONG TERM GOAL #3   Title Pt will report at least 50% improvement in her symptoms in order to improve pain-free function at home    Time 8    Period Weeks    Status New    Target Date 10/01/21                   Plan - 09/01/21 1553     Clinical Impression Statement Pt arrives with excellent motivation to participate in therapy.  Utilized manual techniques for right shoulder due to increased aggravation upon arrival today.  Continued with progressive loading of right shoulder during session for long-term management of pain.  No HEP progression on this date. Patient encouraged to follow-up as scheduled. She will benefit from PT services to address deficits in strength, range of motion, and pain in order to return to full function at home.    Personal Factors and Comorbidities Age;Comorbidity 3+;Past/Current Experience;Time since onset of injury/illness/exacerbation    Comorbidities  psoriasis, anxiety, DM    Examination-Activity Limitations Reach Overhead    Examination-Participation Restrictions Meal Prep;Laundry;Cleaning;Shop    Stability/Clinical Decision Making Evolving/Moderate complexity    Rehab Potential Good    PT Frequency 2x / week    PT Duration 8 weeks    PT Treatment/Interventions ADLs/Self Care Home Management;Aquatic Therapy;Biofeedback;Canalith Repostioning;Cryotherapy;Electrical Stimulation;Iontophoresis '4mg'$ /ml Dexamethasone;Moist Heat;Traction;Ultrasound;Gait training;Functional mobility training;Therapeutic activities;Therapeutic exercise;Balance training;Neuromuscular re-education;Manual techniques;Dry needling;Vestibular;Spinal Manipulations;Joint Manipulations;Passive range of motion    PT Next Visit Plan continue manual therapy and strengthening for shoulders    PT Home Exercise Plan Access Code: [email or web address]    Consulted and Agree with Plan of Care Patient             Patient will benefit from skilled therapeutic intervention in order to improve the following deficits and impairments:  Pain, Decreased range of motion, Decreased strength  Visit Diagnosis: Chronic right shoulder pain  Muscle weakness (generalized)     Problem List Patient Active Problem List   Diagnosis Date Noted   Acute gout involving toe of right foot 08/12/2021   Chronic pain of both shoulders 08/12/2021   History of TIA (transient ischemic attack) 06/26/2021   Statin intolerance 01/15/2021   Myalgia 12/22/2020   MDD (major depressive disorder), recurrent episode, moderate (HCC) 07/08/2020   Chronic insomnia 07/08/2020   Chronic neck pain 10/05/2019   Chronic diarrhea 03/30/2018   GAD (generalized anxiety disorder) 01/31/2018   Osteopenia 12/08/2017   Vitamin D deficiency 03/04/2016   Allergic rhinitis 03/04/2016   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 [place], [place], [address] Phone: [phone]   Fax:  [phone]  Name: Renee [name] MRN: [id number] Date of Birth: 1946-05-22

## 2021-09-03 ENCOUNTER — Other Ambulatory Visit: Payer: Self-pay

## 2021-09-03 ENCOUNTER — Ambulatory Visit: Payer: Medicare Other

## 2021-09-03 DIAGNOSIS — M6281 Muscle weakness (generalized): Secondary | ICD-10-CM | POA: Diagnosis not present

## 2021-09-03 DIAGNOSIS — G8929 Other chronic pain: Secondary | ICD-10-CM | POA: Diagnosis not present

## 2021-09-03 DIAGNOSIS — M25511 Pain in right shoulder: Secondary | ICD-10-CM | POA: Diagnosis not present

## 2021-09-03 DIAGNOSIS — M25512 Pain in left shoulder: Secondary | ICD-10-CM | POA: Diagnosis not present

## 2021-09-03 NOTE — Therapy (Signed)
[facility] [address] [place], [place], [address] Phone: [phone]   Fax:  [phone]  Physical Therapy Treatment  Patient Details  Name: Renee [name] MRN: [id number] Date of Birth: [birth year] Referring Provider (PT): Bo Merino   Encounter Date: 09/03/2021   PT End of Session - 09/03/21 1541     Visit Number 8    Number of Visits 17    Date for PT Re-Evaluation 10/01/21    Authorization Type eval: 08/06/21    PT Start Time 1533    PT Stop Time 1615    PT Time Calculation (min) 42 min    Activity Tolerance Patient tolerated treatment well    Behavior During Therapy [facility] for tasks assessed/performed              Past Medical History:  Diagnosis Date   Allergy    Anxiety    Arthritis    CAD (coronary artery disease)    non-obstructive. cath 11/11   Diabetes mellitus    borderline diabetic - diet controlled    History of chicken pox    History of phobia    clastrophobia   Hyperlipidemia    Hypertension    Hypothyroidism    Kidney stones    hx of    Lichen sclerosus    Psoriasis    Stroke ([place])    Urinary tract infection    hx of     Past Surgical History:  Procedure Laterality Date   APPENDECTOMY     CARDIAC CATHETERIZATION     2011   CARPAL TUNNEL RELEASE     Left trigger finger   CATARACT EXTRACTION W/PHACO Left 06/06/2018   Procedure: CATARACT EXTRACTION PHACO AND INTRAOCULAR LENS PLACEMENT ([place]);  Surgeon: Birder Robson, MD;  Location: [facility] ORS;  Service: Ophthalmology;  Laterality: Left;  [place]  00:23 AP% 16.0 CDE 3.69 Fluid pack lot # [id number]   CATARACT EXTRACTION W/PHACO Right 09/19/2018   Procedure: CATARACT EXTRACTION PHACO AND INTRAOCULAR LENS PLACEMENT (IOC);  Surgeon: Birder Robson, MD;  Location: [facility] ORS;  Service: Ophthalmology;  Laterality: Right;  [place] 00:29.5 AP% 17.0 CDE$ 5.03 Fluid pack lot # [id number]   COLONOSCOPY WITH PROPOFOL N/A 05/13/2017   Procedure: COLONOSCOPY  WITH PROPOFOL;  Surgeon: Manya Silvas, MD;  Location: [facility] ENDOSCOPY;  Service: Endoscopy;  Laterality: N/A;   ESOPHAGOGASTRODUODENOSCOPY (EGD) WITH PROPOFOL N/A 01/04/2020   Procedure: ESOPHAGOGASTRODUODENOSCOPY (EGD) WITH PROPOFOL;  Surgeon: [name], MD;  Location: [facility] ENDOSCOPY;  Service: Gastroenterology;  Laterality: N/A;   EYE SURGERY  2004   lasik - right eye    JOINT REPLACEMENT     KNEE SURGERY Right 2015   arthroscopy   TOTAL KNEE ARTHROPLASTY Bilateral 06/12/2015   Procedure: TOTAL KNEE BILATERAL;  Surgeon: Gaynelle Arabian, MD;  Location: [facility] ORS;  Service: Orthopedics;  Laterality: Bilateral;  and epidural   TUBAL LIGATION     Tubes tied     and untied. and tied again    There were no vitals filed for this visit.   Subjective Assessment - 09/03/21 1541     Subjective Patient reports she is doing well today. No L shoulder pain currently but her R shoulder remains painful. She rates the R shoulder pain as 7/10 upon arrival. Reports compliance with HEP.  No specific questions or concerns currently    Pertinent History Pt reports that she has a history of bilateral shoulder/arm pain for the last 2.5 years. No history of  trauma. "The pain occurs in the muscles." She struggles with pain when lifting her arms. Pain occasionally wakes her up at night. She also complains of occasional neck pain and reports that she was previously diagnosed with occipital neuralgia. No back pain and pt denies pain in other joints. Denies any redness, swelling, or warmth in shoulders. She reports that she has tried OTC pain relievers and Voltaren gel without significant relief. Per medical record plain film radiographs of shoulders show mild degenerative changes in the R shoulder and no findings in L shoulder. Cervical radiographs showed DDD and facet joint arthropathy. MD diagnosed her with subacromial bursitis and referred her for physical therapy. Return appointment with MD is in September. Years ago she  started taking a cholesterol medication and had full body aches. She stopped and her pain resolved so now she is on a non-statin cholesterol medication. PMH includes psoriasis, DM, and bilateral TKR (2016).                 TREATMENT   Ther-ex  UBE x 4 minutes (2 minutes forward/2 minutes backward) for warm-up during history; Seated shoulder flexion with 2# dumbbell (DB) 3 x 10; Seated shoulder scaption with 2# DB 3 x 10; Standing bilateral shoulder extension with red tband 3 x 10; Nautilus lat pull down 50# 3 x 10; Nautilus rows 40# 3 x 10;   Manual Therapy  Gentle right shoulder passive range of motion into flexion, abduction, and external rotation; Supine R shoulder GH A/P mobilizations at neutral grade I-II, 30s/bout x 2 bouts; Supine R shoulder GH inferior mobilizations at 90 abduction grade I-II, 30s/bout x 2 bouts; Supine R shoulder GH A/P mobilizations at 90 abduction and available end range ER, grade I-II, 30s/bout x 2 bouts; Supine R shoulder distraction mobilizations gradually progressing through partial abduction PROM;    Pt educated throughout session about proper posture and technique with exercises. Improved exercise technique, movement at target joints, use of target muscles after min to mod verbal, visual, tactile cues.    Pt arrives with excellent motivation to participate in therapy. Utilized manual techniques for right shoulder and continued with progressive loading of right shoulder during session for long-term management of pain. Unable to significantly increase resistance so added a third set to strengthening exercises. No HEP progression on this date. Patient encouraged to follow-up as scheduled. She will benefit from PT services to address deficits in strength, range of motion, and pain in order to return to full function at home.                     PT Short Term Goals - 08/09/21 1552       PT SHORT TERM GOAL #1   Title Pt will be  independent with HEP in order to improve shoulder range of motion and strength as well as decrease pain in order to improve pain-free function at home.    Baseline --    Time 4    Period Weeks    Status New    Target Date 09/03/21               PT Long Term Goals - 08/09/21 1553       PT LONG TERM GOAL #1   Title Pt will decrease worst bilateral shoulder pain as reported on NPRS by at least 3 points in order to demonstrate clinically significant reduction in shoulder pain.    Baseline 08/06/21: Worst: 9/10    Time 8  Period Weeks    Status New    Target Date 10/01/21      PT LONG TERM GOAL #2   Title Pt will increase FOTO to at 62 least in order to demonstrate significant improvement in function related to bilateral shoulder pain    Baseline 08/06/21: 48    Time 8    Period Weeks    Status New    Target Date 10/01/21      PT LONG TERM GOAL #3   Title Pt will report at least 50% improvement in her symptoms in order to improve pain-free function at home    Time 8    Period Weeks    Status New    Target Date 10/01/21                   Plan - 09/03/21 1553     Clinical Impression Statement Pt arrives with excellent motivation to participate in therapy. Utilized manual techniques for right shoulder and continued with progressive loading of right shoulder during session for long-term management of pain. Unable to significantly increase resistance so added a third set to strengthening exercises. No HEP progression on this date. Patient encouraged to follow-up as scheduled. She will benefit from PT services to address deficits in strength, range of motion, and pain in order to return to full function at home.    Personal Factors and Comorbidities Age;Comorbidity 3+;Past/Current Experience;Time since onset of injury/illness/exacerbation    Comorbidities psoriasis, anxiety, DM    Examination-Activity Limitations Reach Overhead    Examination-Participation Restrictions  Meal Prep;Laundry;Cleaning;Shop    Stability/Clinical Decision Making Evolving/Moderate complexity    Rehab Potential Good    PT Frequency 2x / week    PT Duration 8 weeks    PT Treatment/Interventions ADLs/Self Care Home Management;Aquatic Therapy;Biofeedback;Canalith Repostioning;Cryotherapy;Electrical Stimulation;Iontophoresis '4mg'$ /ml Dexamethasone;Moist Heat;Traction;Ultrasound;Gait training;Functional mobility training;Therapeutic activities;Therapeutic exercise;Balance training;Neuromuscular re-education;Manual techniques;Dry needling;Vestibular;Spinal Manipulations;Joint Manipulations;Passive range of motion    PT Next Visit Plan continue manual therapy and strengthening for shoulders    PT Home Exercise Plan Access Code: [email or web address]    Consulted and Agree with Plan of Care Patient              Patient will benefit from skilled therapeutic intervention in order to improve the following deficits and impairments:  Pain, Decreased range of motion, Decreased strength  Visit Diagnosis: Chronic right shoulder pain  Muscle weakness (generalized)     Problem List Patient Active Problem List   Diagnosis Date Noted   Acute gout involving toe of right foot 08/12/2021   Chronic pain of both shoulders 08/12/2021   History of TIA (transient ischemic attack) 06/26/2021   Statin intolerance 01/15/2021   Myalgia 12/22/2020   MDD (major depressive disorder), recurrent episode, moderate (HCC) 07/08/2020   Chronic insomnia 07/08/2020   Chronic neck pain 10/05/2019   Chronic diarrhea 03/30/2018   GAD (generalized anxiety disorder) 01/31/2018   Osteopenia 12/08/2017   Vitamin D deficiency 03/04/2016   Allergic rhinitis 03/04/2016   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 [place], [place], [address] Phone: [phone]   Fax:  [phone]  Name: Renee [name] MRN: [id number] Date of Birth: January 29, 1946

## 2021-09-08 ENCOUNTER — Other Ambulatory Visit: Payer: Self-pay

## 2021-09-08 ENCOUNTER — Ambulatory Visit: Payer: Medicare Other

## 2021-09-08 DIAGNOSIS — M25511 Pain in right shoulder: Secondary | ICD-10-CM

## 2021-09-08 DIAGNOSIS — M6281 Muscle weakness (generalized): Secondary | ICD-10-CM | POA: Diagnosis not present

## 2021-09-08 DIAGNOSIS — M25512 Pain in left shoulder: Secondary | ICD-10-CM | POA: Diagnosis not present

## 2021-09-08 DIAGNOSIS — G8929 Other chronic pain: Secondary | ICD-10-CM | POA: Diagnosis not present

## 2021-09-08 NOTE — Therapy (Signed)
[place] [facility] [address]. [place], [place], [address] Phone: [phone]   Fax:  [phone]  Physical Therapy Treatment  Patient Details  Name: Renee [name] MRN: [id number] Date of Birth: 1946/07/20 Referring Provider (PT): Bo Merino   Encounter Date: 09/08/2021   PT End of Session - 09/08/21 1535     Visit Number 9    Number of Visits 17    Date for PT Re-Evaluation 10/01/21    Authorization Type eval: 08/06/21    PT Start Time 1530    PT Stop Time 1615    PT Time Calculation (min) 45 min    Activity Tolerance Patient tolerated treatment well    Behavior During Therapy [facility] for tasks assessed/performed              Past Medical History:  Diagnosis Date   Allergy    Anxiety    Arthritis    CAD (coronary artery disease)    non-obstructive. cath 11/11   Diabetes mellitus    borderline diabetic - diet controlled    History of chicken pox    History of phobia    clastrophobia   Hyperlipidemia    Hypertension    Hypothyroidism    Kidney stones    hx of    Lichen sclerosus    Psoriasis    Stroke ([place])    Urinary tract infection    hx of     Past Surgical History:  Procedure Laterality Date   APPENDECTOMY     CARDIAC CATHETERIZATION     2011   CARPAL TUNNEL RELEASE     Left trigger finger   CATARACT EXTRACTION W/PHACO Left 06/06/2018   Procedure: CATARACT EXTRACTION PHACO AND INTRAOCULAR LENS PLACEMENT ([place]);  Surgeon: Birder Robson, MD;  Location: [facility] ORS;  Service: Ophthalmology;  Laterality: Left;  [place]  00:23 AP% 16.0 CDE 3.69 Fluid pack lot # [id number]   CATARACT EXTRACTION W/PHACO Right 09/19/2018   Procedure: CATARACT EXTRACTION PHACO AND INTRAOCULAR LENS PLACEMENT (I[place]);  Surgeon: Birder Robson, MD;  Location: [facility] ORS;  Service: Ophthalmology;  Laterality: Right;  [place] 00:29.5 AP% 17.0 CDE$ 5.03 Fluid pack lot # [id number]   COLONOSCOPY WITH PROPOFOL N/A 05/13/2017   Procedure: COLONOSCOPY  WITH PROPOFOL;  Surgeon: Manya Silvas, MD;  Location: [facility] ENDOSCOPY;  Service: Endoscopy;  Laterality: N/A;   ESOPHAGOGASTRODUODENOSCOPY (EGD) WITH PROPOFOL N/A 01/04/2020   Procedure: ESOPHAGOGASTRODUODENOSCOPY (EGD) WITH PROPOFOL;  Surgeon: [name], MD;  Location: [facility] ENDOSCOPY;  Service: Gastroenterology;  Laterality: N/A;   EYE SURGERY  2004   lasik - right eye    JOINT REPLACEMENT     KNEE SURGERY Right 2015   arthroscopy   TOTAL KNEE ARTHROPLASTY Bilateral 06/12/2015   Procedure: TOTAL KNEE BILATERAL;  Surgeon: Gaynelle Arabian, MD;  Location: [facility]S;  Service: Orthopedics;  Laterality: Bilateral;  and epidural   TUBAL LIGATION     Tubes tied     and untied. and tied again    There were no vitals filed for this visit.   Subjective Assessment - 09/08/21 1534     Subjective Patient reports she is doing well today. No L shoulder pain currently but her R shoulder continues to be very painful. She rates the R shoulder pain as 9/10 upon arrival. She states that the pain occurs at rest and actually feels better when she is moving it. Reports compliance with HEP.  No specific questions or concerns currently    Pertinent  History Pt reports that she has a history of bilateral shoulder/arm pain for the last 2.5 years. No history of trauma. "The pain occurs in the muscles." She struggles with pain when lifting her arms. Pain occasionally wakes her up at night. She also complains of occasional neck pain and reports that she was previously diagnosed with occipital neuralgia. No back pain and pt denies pain in other joints. Denies any redness, swelling, or warmth in shoulders. She reports that she has tried OTC pain relievers and Voltaren gel without significant relief. Per medical record plain film radiographs of shoulders show mild degenerative changes in the R shoulder and no findings in L shoulder. Cervical radiographs showed DDD and facet joint arthropathy. MD diagnosed her with subacromial  bursitis and referred her for physical therapy. Return appointment with MD is in September. Years ago she started taking a cholesterol medication and had full body aches. She stopped and her pain resolved so now she is on a non-statin cholesterol medication. PMH includes psoriasis, DM, and bilateral TKR (2016).                 TREATMENT   Ther-ex  UBE x 4 minutes (2 minutes forward/2 minutes backward) for warm-up during history; Seated shoulder pulleys for AAROM flexion and abduction; Supine R shoulder flexion with 3# dumbbell (DB) 2 x 10; Supine right shoulder serratus punch with 3 pound dumbbell 2x10; Supine right shoulder circles with 3 pound dumbbell clockwise/counterclockwise 2x10 each; Left side-lying right shoulder abduction with 3 pound dumbbell 2x10; Left side-lying right shoulder external rotation with 3 pound dumbbell 2x10;   Manual Therapy  Gentle right shoulder passive range of motion into flexion, abduction, and external rotation; Supine R shoulder GH A/P mobilizations at neutral grade III, 30s/bout x 2 bouts; Supine R shoulder GH inferior mobilizations at 90 abduction grade III, 30s/bout x 2 bouts; Supine R shoulder GH A/P mobilizations at 90 abduction and available end range ER, grade III, 30s/bout x 2 bouts; Supine R shoulder distraction mobilizations gradually progressing through partial abduction PROM;    Pt educated throughout session about proper posture and technique with exercises. Improved exercise technique, movement at target joints, use of target muscles after min to mod verbal, visual, tactile cues.    Pt arrives with excellent motivation to participate in therapy. Utilized manual techniques for right shoulder and continued with progressive loading of right shoulder during session for long-term management of pain. No HEP progression on this date.  Patient continues to struggle with significant right shoulder pain and is considering seeking a referral to  orthopedics.  She does have notable right shoulder external and internal rotation weakness with strength testing.  Patient encouraged to follow-up as scheduled. She will benefit from PT services to address deficits in strength, range of motion, and pain in order to return to full function at home.                     PT Short Term Goals - 08/09/21 1552       PT SHORT TERM GOAL #1   Title Pt will be independent with HEP in order to improve shoulder range of motion and strength as well as decrease pain in order to improve pain-free function at home.    Baseline --    Time 4    Period Weeks    Status New    Target Date 09/03/21               PT  Long Term Goals - 08/09/21 1553       PT LONG TERM GOAL #1   Title Pt will decrease worst bilateral shoulder pain as reported on NPRS by at least 3 points in order to demonstrate clinically significant reduction in shoulder pain.    Baseline 08/06/21: Worst: 9/10    Time 8    Period Weeks    Status New    Target Date 10/01/21      PT LONG TERM GOAL #2   Title Pt will increase FOTO to at 62 least in order to demonstrate significant improvement in function related to bilateral shoulder pain    Baseline 08/06/21: 48    Time 8    Period Weeks    Status New    Target Date 10/01/21      PT LONG TERM GOAL #3   Title Pt will report at least 50% improvement in her symptoms in order to improve pain-free function at home    Time 8    Period Weeks    Status New    Target Date 10/01/21                   Plan - 09/08/21 1535     Clinical Impression Statement Pt arrives with excellent motivation to participate in therapy. Utilized manual techniques for right shoulder and continued with progressive loading of right shoulder during session for long-term management of pain. No HEP progression on this date.  Patient continues to struggle with significant right shoulder pain and is considering seeking a referral to  orthopedics.  She does have notable right shoulder external and internal rotation weakness with strength testing.  Patient encouraged to follow-up as scheduled. She will benefit from PT services to address deficits in strength, range of motion, and pain in order to return to full function at home.    Personal Factors and Comorbidities Age;Comorbidity 3+;Past/Current Experience;Time since onset of injury/illness/exacerbation    Comorbidities psoriasis, anxiety, DM    Examination-Activity Limitations Reach Overhead    Examination-Participation Restrictions Meal Prep;Laundry;Cleaning;Shop    Stability/Clinical Decision Making Evolving/Moderate complexity    Rehab Potential Good    PT Frequency 2x / week    PT Duration 8 weeks    PT Treatment/Interventions ADLs/Self Care Home Management;Aquatic Therapy;Biofeedback;Canalith Repostioning;Cryotherapy;Electrical Stimulation;Iontophoresis '4mg'$ /ml Dexamethasone;Moist Heat;Traction;Ultrasound;Gait training;Functional mobility training;Therapeutic activities;Therapeutic exercise;Balance training;Neuromuscular re-education;Manual techniques;Dry needling;Vestibular;Spinal Manipulations;Joint Manipulations;Passive range of motion    PT Next Visit Plan Update outcome measures, goals, progress note; continue manual therapy and strengthening for shoulders    PT Home Exercise Plan Access Code: [email or web address]    Consulted and Agree with Plan of Care Patient              Patient will benefit from skilled therapeutic intervention in order to improve the following deficits and impairments:  Pain, Decreased range of motion, Decreased strength  Visit Diagnosis: Chronic right shoulder pain  Muscle weakness (generalized)     Problem List Patient Active Problem List   Diagnosis Date Noted   Acute gout involving toe of right foot 08/12/2021   Chronic pain of both shoulders 08/12/2021   History of TIA (transient ischemic attack) 06/26/2021   Statin intolerance  01/15/2021   Myalgia 12/22/2020   MDD (major depressive disorder), recurrent episode, moderate (HCC) 07/08/2020   Chronic insomnia 07/08/2020   Chronic neck pain 10/05/2019   Chronic diarrhea 03/30/2018   GAD (generalized anxiety disorder) 01/31/2018   Osteopenia 12/08/2017   Vitamin D deficiency 03/04/2016   Allergic rhinitis  03/04/2016   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 [place], [place], [address] Phone: [phone]   Fax:  [phone]  Name: Renee [name] MRN: [id number] Date of Birth: [birth year]

## 2021-09-10 ENCOUNTER — Ambulatory Visit: Payer: Medicare Other

## 2021-09-10 ENCOUNTER — Other Ambulatory Visit: Payer: Self-pay

## 2021-09-10 DIAGNOSIS — M25512 Pain in left shoulder: Secondary | ICD-10-CM

## 2021-09-10 DIAGNOSIS — G8929 Other chronic pain: Secondary | ICD-10-CM | POA: Diagnosis not present

## 2021-09-10 DIAGNOSIS — M6281 Muscle weakness (generalized): Secondary | ICD-10-CM

## 2021-09-10 DIAGNOSIS — M25511 Pain in right shoulder: Secondary | ICD-10-CM | POA: Diagnosis not present

## 2021-09-10 NOTE — Therapy (Signed)
[facility] [address] [place], [place], [address] Phone: [phone]   Fax:  [phone]  Physical Therapy Progress Note/Treatment   Dates of reporting period  08/06/21  to   09/10/21  Patient Details  Name: Renee [name] MRN: [id number] Date of Birth: [birth year] Referring Provider (PT): Bo Merino   Encounter Date: 09/10/2021   PT End of Session - 09/10/21 1539     Visit Number 10    Number of Visits 17    Date for PT Re-Evaluation 10/01/21    Authorization Type eval: 08/06/21    PT Start Time 1535    PT Stop Time 1615    PT Time Calculation (min) 40 min    Activity Tolerance Patient tolerated treatment well    Behavior During Therapy [facility] for tasks assessed/performed              Past Medical History:  Diagnosis Date   Allergy    Anxiety    Arthritis    CAD (coronary artery disease)    non-obstructive. cath 11/11   Diabetes mellitus    borderline diabetic - diet controlled    History of chicken pox    History of phobia    clastrophobia   Hyperlipidemia    Hypertension    Hypothyroidism    Kidney stones    hx of    Lichen sclerosus    Psoriasis    Stroke ([place])    Urinary tract infection    hx of     Past Surgical History:  Procedure Laterality Date   APPENDECTOMY     CARDIAC CATHETERIZATION     2011   CARPAL TUNNEL RELEASE     Left trigger finger   CATARACT EXTRACTION W/PHACO Left 06/06/2018   Procedure: CATARACT EXTRACTION PHACO AND INTRAOCULAR LENS PLACEMENT ([place]);  Surgeon: Birder Robson, MD;  Location: [facility] ORS;  Service: Ophthalmology;  Laterality: Left;  [place]  00:23 AP% 16.0 CDE 3.69 Fluid pack lot # [id number]   CATARACT EXTRACTION W/PHACO Right 09/19/2018   Procedure: CATARACT EXTRACTION PHACO AND INTRAOCULAR LENS PLACEMENT ([place]);  Surgeon: Birder Robson, MD;  Location: [facility] ORS;  Service: Ophthalmology;  Laterality: Right;  [place] 00:29.5 AP% 17.0 CDE$ 5.03 Fluid pack lot # [id number] H    COLONOSCOPY WITH PROPOFOL N/A 05/13/2017   Procedure: COLONOSCOPY WITH PROPOFOL;  Surgeon: Manya Silvas, MD;  Location: [facility] ENDOSCOPY;  Service: Endoscopy;  Laterality: N/A;   ESOPHAGOGASTRODUODENOSCOPY (EGD) WITH PROPOFOL N/A 01/04/2020   Procedure: ESOPHAGOGASTRODUODENOSCOPY (EGD) WITH PROPOFOL;  Surgeon: [name], MD;  Location: [facility] ENDOSCOPY;  Service: Gastroenterology;  Laterality: N/A;   EYE SURGERY  2004   lasik - right eye    JOINT REPLACEMENT     KNEE SURGERY Right 2015   arthroscopy   TOTAL KNEE ARTHROPLASTY Bilateral 06/12/2015   Procedure: TOTAL KNEE BILATERAL;  Surgeon: Gaynelle Arabian, MD;  Location: [facility] ORS;  Service: Orthopedics;  Laterality: Bilateral;  and epidural   TUBAL LIGATION     Tubes tied     and untied. and tied again    There were no vitals filed for this visit.   Subjective Assessment - 09/10/21 1538     Subjective Patient reports she is doing well today. She was sore yesterday but denies any pain in either shoulder today. No specific questions or concerns currently    Pertinent History Pt reports that she has a history of bilateral shoulder/arm pain for the last 2.5 years. No history of  trauma. "The pain occurs in the muscles." She struggles with pain when lifting her arms. Pain occasionally wakes her up at night. She also complains of occasional neck pain and reports that she was previously diagnosed with occipital neuralgia. No back pain and pt denies pain in other joints. Denies any redness, swelling, or warmth in shoulders. She reports that she has tried OTC pain relievers and Voltaren gel without significant relief. Per medical record plain film radiographs of shoulders show mild degenerative changes in the R shoulder and no findings in L shoulder. Cervical radiographs showed DDD and facet joint arthropathy. MD diagnosed her with subacromial bursitis and referred her for physical therapy. Return appointment with MD is in September. Years ago she started  taking a cholesterol medication and had full body aches. She stopped and her pain resolved so now she is on a non-statin cholesterol medication. PMH includes psoriasis, DM, and bilateral TKR (2016).                 TREATMENT   Ther-ex  UBE x 4 minutes (2 minutes forward/2 minutes backward) for warm-up during history; Seated shoulder pulleys for AAROM flexion and abduction; Updated outcome measures with patient: Worst pain: L: 4/10; R: 7/10; % improvement: L: 60% improvement, R: 50% improvement; FOTO: 65 Seated bilateral shoulder flexion with 3# dumbbell (DB) 2 x 10 to shoulder height; Seated bilateral shoulder scaption with 3# DB 2 x 10 to shoulder height; Seated overhead shoulder press with 3# DB 2 x 10; Nautilus shoulder IR and ER 2 x 10 each bilateral;   Manual Therapy  Gentle right shoulder passive range of motion into flexion, abduction, and external rotation; Supine R shoulder GH A/P mobilizations at neutral grade III, 30s/bout x 2 bouts; Supine R shoulder GH inferior mobilizations at 90 abduction grade III, 30s/bout x 2 bouts; Supine R shoulder GH A/P mobilizations at 90 abduction and available end range ER, grade III, 30s/bout x 2 bouts; Supine R shoulder distraction mobilizations gradually progressing through partial abduction PROM;    Pt educated throughout session about proper posture and technique with exercises. Improved exercise technique, movement at target joints, use of target muscles after min to mod verbal, visual, tactile cues.    Pt arrives with excellent motivation to participate in therapy.  Updated outcome measures and goals with patient during session today.  Her worst shoulder pain has decreased from 9/10 bilaterally at initial evaluation to 4/10 on the left and 7/10 on the right today.  Her FOTO score improved from 48 at initial evaluation to 65 today and overall she reports 60% improvement in symptoms in left shoulder and 50% improvement symptoms in  right shoulder.  Utilized manual techniques for right shoulder and continued with progressive loading of right shoulder during session for long-term management of pain. No HEP progression on this date.  Patient continues to struggle with significant right shoulder pain and is considering seeking a referral to orthopedics.  She does have notable right shoulder external and internal rotation weakness with strength testing last session. Patient encouraged to follow-up as scheduled. She will benefit from PT services to address deficits in strength, range of motion, and pain in order to return to full function at home.                     PT Short Term Goals - 09/10/21 1541       PT SHORT TERM GOAL #1   Title Pt will be independent with HEP in  order to improve shoulder range of motion and strength as well as decrease pain in order to improve pain-free function at home.    Time 4    Period Weeks    Status New    Target Date 09/03/21               PT Long Term Goals - 09/10/21 1541       PT LONG TERM GOAL #1   Title Pt will decrease worst bilateral shoulder pain as reported on NPRS by at least 3 points in order to demonstrate clinically significant reduction in shoulder pain.    Baseline 08/06/21: Worst: 9/10; 09/10/21: L: 4/10; R: 7/10;    Time 8    Period Weeks    Status Partially Met    Target Date 10/01/21      PT LONG TERM GOAL #2   Title Pt will increase FOTO to at 62 least in order to demonstrate significant improvement in function related to bilateral shoulder pain    Baseline 08/06/21: 48; 09/10/21: 65    Time 8    Period Weeks    Status Achieved    Target Date --      PT LONG TERM GOAL #3   Title Pt will report at least 50% improvement in her symptoms in order to improve pain-free function at home    Baseline 09/10/21: L: 60% improvement, R: 50% improvement    Time 8    Period Weeks    Status Achieved    Target Date --                   Plan -  09/10/21 1541     Clinical Impression Statement Pt arrives with excellent motivation to participate in therapy.  Updated outcome measures and goals with patient during session today.  Her worst shoulder pain has decreased from 9/10 bilaterally at initial evaluation to 4/10 on the left and 7/10 on the right today.  Her FOTO score improved from 48 at initial evaluation to 65 today and overall she reports 60% improvement in symptoms in left shoulder and 50% improvement symptoms in right shoulder.  Utilized manual techniques for right shoulder and continued with progressive loading of right shoulder during session for long-term management of pain. No HEP progression on this date.  Patient continues to struggle with significant right shoulder pain and is considering seeking a referral to orthopedics.  She does have notable right shoulder external and internal rotation weakness with strength testing last session. Patient encouraged to follow-up as scheduled. She will benefit from PT services to address deficits in strength, range of motion, and pain in order to return to full function at home.    Personal Factors and Comorbidities Age;Comorbidity 3+;Past/Current Experience;Time since onset of injury/illness/exacerbation    Comorbidities psoriasis, anxiety, DM    Examination-Activity Limitations Reach Overhead    Examination-Participation Restrictions Meal Prep;Laundry;Cleaning;Shop    Stability/Clinical Decision Making Evolving/Moderate complexity    Rehab Potential Good    PT Frequency 2x / week    PT Duration 8 weeks    PT Treatment/Interventions ADLs/Self Care Home Management;Aquatic Therapy;Biofeedback;Canalith Repostioning;Cryotherapy;Electrical Stimulation;Iontophoresis 4mg /ml Dexamethasone;Moist Heat;Traction;Ultrasound;Gait training;Functional mobility training;Therapeutic activities;Therapeutic exercise;Balance training;Neuromuscular re-education;Manual techniques;Dry needling;Vestibular;Spinal  Manipulations;Joint Manipulations;Passive range of motion    PT Next Visit Plan Update outcome measures, goals, progress note; continue manual therapy and strengthening for shoulders    PT Home Exercise Plan Access Code: [email or web address]    Consulted and Agree with Plan of Care Patient  Patient will benefit from skilled therapeutic intervention in order to improve the following deficits and impairments:  Pain, Decreased range of motion, Decreased strength  Visit Diagnosis: Chronic right shoulder pain  Muscle weakness (generalized)  Chronic left shoulder pain     Problem List Patient Active Problem List   Diagnosis Date Noted   Acute gout involving toe of right foot 08/12/2021   Chronic pain of both shoulders 08/12/2021   History of TIA (transient ischemic attack) 06/26/2021   Statin intolerance 01/15/2021   Myalgia 12/22/2020   MDD (major depressive disorder), recurrent episode, moderate (HCC) 07/08/2020   Chronic insomnia 07/08/2020   Chronic neck pain 10/05/2019   Chronic diarrhea 03/30/2018   GAD (generalized anxiety disorder) 01/31/2018   Osteopenia 12/08/2017   Vitamin D deficiency 03/04/2016   Allergic rhinitis 03/04/2016   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 [place], [place], [address] Phone: [phone]   Fax:  [phone]  Name: JEWELIA BOCCHINO MRN: [id number] Date of Birth: 1946-11-20

## 2021-09-15 ENCOUNTER — Other Ambulatory Visit: Payer: Self-pay

## 2021-09-15 ENCOUNTER — Ambulatory Visit: Payer: Medicare Other

## 2021-09-15 DIAGNOSIS — M6281 Muscle weakness (generalized): Secondary | ICD-10-CM

## 2021-09-15 DIAGNOSIS — G8929 Other chronic pain: Secondary | ICD-10-CM | POA: Diagnosis not present

## 2021-09-15 DIAGNOSIS — M25511 Pain in right shoulder: Secondary | ICD-10-CM | POA: Diagnosis not present

## 2021-09-15 DIAGNOSIS — M25512 Pain in left shoulder: Secondary | ICD-10-CM

## 2021-09-15 NOTE — Therapy (Signed)
[facility] [address] [place], [place], [address] Phone: [phone]   Fax:  [phone]  Physical Therapy Treatment   Patient Details  Name: Renee [name] MRN: [id number] Date of Birth: [birth year] Referring Provider (PT): Bo Merino   Encounter Date: 09/15/2021   PT End of Session - 09/15/21 1452     Visit Number 11    Number of Visits 17    Date for PT Re-Evaluation 10/01/21    Authorization Type eval: 08/06/21    PT Start Time 1448    PT Stop Time 1530    PT Time Calculation (min) 42 min    Activity Tolerance Patient tolerated treatment well    Behavior During Therapy [facility] for tasks assessed/performed              Past Medical History:  Diagnosis Date   Allergy    Anxiety    Arthritis    CAD (coronary artery disease)    non-obstructive. cath 11/11   Diabetes mellitus    borderline diabetic - diet controlled    History of chicken pox    History of phobia    clastrophobia   Hyperlipidemia    Hypertension    Hypothyroidism    Kidney stones    hx of    Lichen sclerosus    Psoriasis    Stroke ([place])    Urinary tract infection    hx of     Past Surgical History:  Procedure Laterality Date   APPENDECTOMY     CARDIAC CATHETERIZATION     2011   CARPAL TUNNEL RELEASE     Left trigger finger   CATARACT EXTRACTION W/PHACO Left 06/06/2018   Procedure: CATARACT EXTRACTION PHACO AND INTRAOCULAR LENS PLACEMENT ([place]);  Surgeon: Birder Robson, MD;  Location: [facility] ORS;  Service: Ophthalmology;  Laterality: Left;  [place]  00:23 AP% 16.0 CDE 3.69 Fluid pack lot # [id number]   CATARACT EXTRACTION W/PHACO Right 09/19/2018   Procedure: CATARACT EXTRACTION PHACO AND INTRAOCULAR LENS PLACEMENT (IOC);  Surgeon: Birder Robson, MD;  Location: [facility] ORS;  Service: Ophthalmology;  Laterality: Right;  [place] 00:29.5 AP% 17.0 CDE$ 5.03 Fluid pack lot # [id number] H   COLONOSCOPY WITH PROPOFOL N/A 05/13/2017   Procedure:  COLONOSCOPY WITH PROPOFOL;  Surgeon: Manya Silvas, MD;  Location: [facility] ENDOSCOPY;  Service: Endoscopy;  Laterality: N/A;   ESOPHAGOGASTRODUODENOSCOPY (EGD) WITH PROPOFOL N/A 01/04/2020   Procedure: ESOPHAGOGASTRODUODENOSCOPY (EGD) WITH PROPOFOL;  Surgeon: [name], MD;  Location: [facility] ENDOSCOPY;  Service: Gastroenterology;  Laterality: N/A;   EYE SURGERY  2004   lasik - right eye    JOINT REPLACEMENT     KNEE SURGERY Right 2015   arthroscopy   TOTAL KNEE ARTHROPLASTY Bilateral 06/12/2015   Procedure: TOTAL KNEE BILATERAL;  Surgeon: Gaynelle Arabian, MD;  Location: [facility] ORS;  Service: Orthopedics;  Laterality: Bilateral;  and epidural   TUBAL LIGATION     Tubes tied     and untied. and tied again    There were no vitals filed for this visit.   Subjective Assessment - 09/15/21 1451     Subjective Patient reports she is doing alright today. She was having a lot of shoulder pain over the weekend. She reports 8/10 R shoulder pain and 5/10 L shoulder pain currently. No specific questions or concerns currently    Pertinent History Pt reports that she has a history of bilateral shoulder/arm pain for the last 2.5 years. No history of trauma. "The  pain occurs in the muscles." She struggles with pain when lifting her arms. Pain occasionally wakes her up at night. She also complains of occasional neck pain and reports that she was previously diagnosed with occipital neuralgia. No back pain and pt denies pain in other joints. Denies any redness, swelling, or warmth in shoulders. She reports that she has tried OTC pain relievers and Voltaren gel without significant relief. Per medical record plain film radiographs of shoulders show mild degenerative changes in the R shoulder and no findings in L shoulder. Cervical radiographs showed DDD and facet joint arthropathy. MD diagnosed her with subacromial bursitis and referred her for physical therapy. Return appointment with MD is in September. Years ago she started  taking a cholesterol medication and had full body aches. She stopped and her pain resolved so now she is on a non-statin cholesterol medication. PMH includes psoriasis, DM, and bilateral TKR (2016).                 TREATMENT   Ther-ex  UBE x 5 minutes (2.5 minutes forward/2.5 minutes backward) for warm-up during history; Nautilus lat pull down 50#, discontinued secondary to R shoulder pain; Nautilus rows 50# 2 x 10; Nautilus shoulder IR and ER 10# 2 x 10 each bilateral; Seated bilateral shoulder flexion with 3# dumbbell (DB) 2 x 10 to shoulder height; Seated bilateral shoulder scaption with 3# DB 2 x 10 to shoulder height; Seated overhead shoulder press with 3# DB 2 x 10; Standing ball circles on wall CW/CCW 2 x 10 each; Standing ball serratus punch into wall x10 each; Wall push-up x10;   Pt educated throughout session about proper posture and technique with exercises. Improved exercise technique, movement at target joints, use of target muscles after min to mod verbal, visual, tactile cues.    Pt arrives with excellent motivation to participate in therapy.  She continues with significant right shoulder pain and reports that she would likely seek a referral to orthopedics.  Continued with strengthening during session today.  Attempted lat pull downs however patient reports increase in right shoulder pain so discontinued.  Progressed to closed chain activities such as ball rolls on wall as well as wall push-ups.  Patient encouraged to follow-up as scheduled. She will benefit from PT services to address deficits in strength, range of motion, and pain in order to return to full function at home.                     PT Short Term Goals - 09/10/21 1541       PT SHORT TERM GOAL #1   Title Pt will be independent with HEP in order to improve shoulder range of motion and strength as well as decrease pain in order to improve pain-free function at home.    Time 4     Period Weeks    Status New    Target Date 09/03/21               PT Long Term Goals - 09/10/21 1541       PT LONG TERM GOAL #1   Title Pt will decrease worst bilateral shoulder pain as reported on NPRS by at least 3 points in order to demonstrate clinically significant reduction in shoulder pain.    Baseline 08/06/21: Worst: 9/10; 09/10/21: L: 4/10; R: 7/10;    Time 8    Period Weeks    Status Partially Met    Target Date 10/01/21  PT LONG TERM GOAL #2   Title Pt will increase FOTO to at 62 least in order to demonstrate significant improvement in function related to bilateral shoulder pain    Baseline 08/06/21: 48; 09/10/21: 65    Time 8    Period Weeks    Status Achieved    Target Date --      PT LONG TERM GOAL #3   Title Pt will report at least 50% improvement in her symptoms in order to improve pain-free function at home    Baseline 09/10/21: L: 60% improvement, R: 50% improvement    Time 8    Period Weeks    Status Achieved    Target Date --                   Plan - 09/15/21 1452     Clinical Impression Statement Pt arrives with excellent motivation to participate in therapy.  She continues with significant right shoulder pain and reports that she would likely seek a referral to orthopedics.  Continued with strengthening during session today.  Attempted lat pull downs however patient reports increase in right shoulder pain so discontinued.  Progressed to closed chain activities such as ball rolls on wall as well as wall push-ups.  Patient encouraged to follow-up as scheduled. She will benefit from PT services to address deficits in strength, range of motion, and pain in order to return to full function at home.    Personal Factors and Comorbidities Age;Comorbidity 3+;Past/Current Experience;Time since onset of injury/illness/exacerbation    Comorbidities psoriasis, anxiety, DM    Examination-Activity Limitations Reach Overhead    Examination-Participation  Restrictions Meal Prep;Laundry;Cleaning;Shop    Stability/Clinical Decision Making Evolving/Moderate complexity    Rehab Potential Good    PT Frequency 2x / week    PT Duration 8 weeks    PT Treatment/Interventions ADLs/Self Care Home Management;Aquatic Therapy;Biofeedback;Canalith Repostioning;Cryotherapy;Electrical Stimulation;Iontophoresis 27m/ml Dexamethasone;Moist Heat;Traction;Ultrasound;Gait training;Functional mobility training;Therapeutic activities;Therapeutic exercise;Balance training;Neuromuscular re-education;Manual techniques;Dry needling;Vestibular;Spinal Manipulations;Joint Manipulations;Passive range of motion    PT Next Visit Plan Continue manual therapy and strengthening for shoulders    PT Home Exercise Plan Access Code: [email or web address]   Consulted and Agree with Plan of Care Patient              Patient will benefit from skilled therapeutic intervention in order to improve the following deficits and impairments:  Pain, Decreased range of motion, Decreased strength  Visit Diagnosis: Chronic right shoulder pain  Chronic left shoulder pain  Muscle weakness (generalized)     Problem List Patient Active Problem List   Diagnosis Date Noted   Acute gout involving toe of right foot 08/12/2021   Chronic pain of both shoulders 08/12/2021   History of TIA (transient ischemic attack) 06/26/2021   Statin intolerance 01/15/2021   Myalgia 12/22/2020   MDD (major depressive disorder), recurrent episode, moderate (HCC) 07/08/2020   Chronic insomnia 07/08/2020   Chronic neck pain 10/05/2019   Chronic diarrhea 03/30/2018   GAD (generalized anxiety disorder) 01/31/2018   Osteopenia 12/08/2017   Vitamin D deficiency 03/04/2016   Allergic rhinitis 03/04/2016   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 [place], [place], [address] Phone: [phone]   Fax:  [phone]  Name: LYRICA MCCLARTY MRN: [id number] Date of Birth: 1946-12-02

## 2021-09-16 ENCOUNTER — Telehealth: Payer: Self-pay | Admitting: Rheumatology

## 2021-09-16 ENCOUNTER — Other Ambulatory Visit: Payer: Self-pay | Admitting: *Deleted

## 2021-09-16 ENCOUNTER — Ambulatory Visit (INDEPENDENT_AMBULATORY_CARE_PROVIDER_SITE_OTHER): Payer: Medicare Other | Admitting: Neurology

## 2021-09-16 ENCOUNTER — Encounter: Payer: Self-pay | Admitting: Neurology

## 2021-09-16 VITALS — BP 131/74 | HR 84 | Ht 64.0 in | Wt 145.0 lb

## 2021-09-16 DIAGNOSIS — R413 Other amnesia: Secondary | ICD-10-CM | POA: Diagnosis not present

## 2021-09-16 DIAGNOSIS — I25118 Atherosclerotic heart disease of native coronary artery with other forms of angina pectoris: Secondary | ICD-10-CM | POA: Diagnosis not present

## 2021-09-16 DIAGNOSIS — G8929 Other chronic pain: Secondary | ICD-10-CM

## 2021-09-16 DIAGNOSIS — Z8673 Personal history of transient ischemic attack (TIA), and cerebral infarction without residual deficits: Secondary | ICD-10-CM | POA: Diagnosis not present

## 2021-09-16 DIAGNOSIS — M25511 Pain in right shoulder: Secondary | ICD-10-CM

## 2021-09-16 MED ORDER — COENZYME Q10 30 MG PO CAPS: ORAL_CAPSULE | ORAL | 1 refills | Status: DC

## 2021-09-16 NOTE — Patient Instructions (Signed)
I had a long discussion with the patient and the daughter regarding her episode of transient blurred vision and numbness possibly TIA versus atypical migraine and she seems to be doing well and has had no further episodes.  Discussed results of CT angiograms and temporal artery ultrasound and answered questions.  She is also having mild short-term memory difficulties likely due to mild age-related cognitive impairment.  I recommend she increase participation in cognitively challenging activities like solving crossword puzzles, playing bridge and sodoku and we discussed memory compensation strategies.  Continue aspirin for stroke prevention and maintain aggressive risk factor modification.  Add co-Q10 200 mg daily for her myalgias.  She will return for follow-up in 6 months or call earlier if necessary. Memory Compensation Strategies  Use "WARM" strategy.  W= write it down  A= associate it  R= repeat it  M= make a mental note  2.   You can keep a Social worker.  Use a 3-ring notebook with sections for the following: calendar, important names and phone numbers,  medications, doctors' names/phone numbers, lists/reminders, and a section to journal what you did  each day.   3.    Use a calendar to write appointments down.  4.    Write yourself a schedule for the day.  This can be placed on the calendar or in a separate section of the Memory Notebook.  Keeping a  regular schedule can help memory.  5.    Use medication organizer with sections for each day or morning/evening pills.  You may need help loading it  6.    Keep a basket, or pegboard by the door.  Place items that you need to take out with you in the basket or on the pegboard.  You may also want to  include a message board for reminders.  7.    Use sticky notes.  Place sticky notes with reminders in a place where the task is performed.  For example: " turn off the  stove" placed by the stove, "lock the door" placed on the door at eye  level, " take your medications" on  the bathroom mirror or by the place where you normally take your medications.  8.    Use alarms/timers.  Use while cooking to remind yourself to check on food or as a reminder to take your medicine, or as a  reminder to make a call, or as a reminder to perform another task, etc.

## 2021-09-16 NOTE — Telephone Encounter (Signed)
Ok to schedule an appointment at [facility] for further evaluation. She has now tried PT and Dr. Estanislado Pandy performed injections in both shoulders at her appointment on 08/21/21.

## 2021-09-16 NOTE — Telephone Encounter (Signed)
Tammy, patient's daughter calling in reference to reoccurring shoulder pain that patient is having. Patient has been taking PT, but shoulders now beginning to hurt again. PT did ask about Orthopedic evaluation for patient. Patient has gone to [facility] in the past, but is not sure if she should go to Orthopedic, or return here? Please call to advise.

## 2021-09-16 NOTE — Telephone Encounter (Signed)
I called patient's daughter, [name], referral placed for [facility].

## 2021-09-16 NOTE — Progress Notes (Signed)
[facility] [address] [place]. [place] [address] [phone]       OFFICE FOLLOW UP VISIT NOTE  Ms. ELOINA ERGLE Date of Birth:  17-Dec-1946 Medical Record Number:  [id number]   Referring MD:  Merrily Pew  Reason for Referral:  TIA  HPI: Initial visit 06/25/2021 :Ms. Dolce is a pleasant 75 year old Caucasian lady who is referred today for initial office consultation visit for TIA.  History is obtained from the patient, her daughter as well as review of electronic medical records and I personally reviewed pertinent imaging films in PACS.  She has a past medical history of coronary artery disease, diabetes, hypertension, hyperlipidemia, hypothyroidism and arthritis.  She states she had episode of sudden onset of blurred vision and dark vision when she woke up from sleep on 06/17/2021.  She was able to see but vision was not clear.  She checked her blood sugar which was fine.  She also noticed that she has some trouble thinking straight and trouble speaking.  She went to a restaurant where she had trouble ordering her food eventually she was able to place an order.  She subsequently had a meal and sat in a car and while driving to the gynecologist office noticed numbness in the right hand from the wrist down.  She also felt that her right cheek of her cheek was being pulled to the side.  When she continues with annual gynecology she advised her to go to the ER.  Her symptoms are completely resolved by the time she reached there.  Patient denied any accompanying headache, double vision, vertigo, gait or balance problems.  Patient said she had a somewhat similar episode a couple of years ago when she also had some difficulty with vision, focusing as well as trouble thinking and word finding which also did not stay long.  She did not have a headache after that.  She did not seek medical help at that time.  Patient does have a history of remote migraines but she states she is  outgrown them and has not had one for years.  2 of her daughters also have migraines.  She denies any other prior history of strokes or TIAs or seizures.  She had a CT scan of the head on 06/17/2021 which showed possibly a right basal ganglia lacunar however an MRI scan done the next day showed no evidence of acute stroke or old strokes and only showed age-related atrophy and white matter changes.  Lab work on 04/27/2021 showed hemoglobin A1c of 7.3 and LDL cholesterol 140 mg percent.  Patient also has complaints of headache with posterior neck pain with tenderness in the left occipital groove.  She also complains of muscle aches and pains and in fact has an appointment soon to see rheumatologist Dr. Estanislado Pandy for these complaints.Marland Kitchen Update 09/16/2021 : She returns for follow-up after last visit 2 and half months ago.  She is accompanied by her daughter [name].  Patient continues to have memory difficulties mostly for short-term.  She still lives independently and manages most of her activities of daily living though her daughters live nearby and provide support.  She has had no further episodes of TIA or blurred vision no complicated migraine episodes.  Main complaint today is chronic right shoulder pain which limits her range of motion.  She has gotten some injection of steroids for chronic pain that has not helped.  She is currently doing some home therapy.  She had recent lab work  on 08/24/2021 which showed borderline hemoglobin A1c of 6.6 and LDL cholesterol of 84 mg percent.  She remains on Zetia and previously has been intolerant to Crestor and Zocor.  She had CT angiogram of the brain and neck done on 07/17/2021 which showed no significant large vessel intracranial or extracranial stenosis.  There was 1 to 2 mm paraclinoid left ICA infundibulum versus tiny aneurysm noted.  Temporal artery ultrasound on 07/16/2021 was negative for halo sign. ROS:   14 system review of systems is positive for blurred vision, memory  difficulties speech difficulty, difficulty figuring out things, numbness and headache and neck pain and all other systems negative  PMH:  Past Medical History:  Diagnosis Date   Allergy    Anxiety    Arthritis    CAD (coronary artery disease)    non-obstructive. cath 11/11   Diabetes mellitus    borderline diabetic - diet controlled    History of chicken pox    History of phobia    clastrophobia   Hyperlipidemia    Hypertension    Hypothyroidism    Kidney stones    hx of    Lichen sclerosus    Psoriasis    Stroke ([place])    Urinary tract infection    hx of     Social History:  Social History   Socioeconomic History   Marital status: Divorced    Spouse name: Not on file   Number of children: 6   Years of education: Not on file   Highest education level: Not on file  Occupational History   Occupation: Retired  Tobacco Use   Smoking status: Never   Smokeless tobacco: Never  Vaping Use   Vaping Use: Never used  Substance and Sexual Activity   Alcohol use: Yes    Alcohol/week: 0.0 standard drinks    Comment: occasionally   Drug use: No   Sexual activity: Not Currently    Birth control/protection: Post-menopausal  Other Topics Concern   Not on file  Social History Narrative   Lives alone   Right Handed   Drink 1 cups caffeine daily   Social Determinants of Health   Financial Resource Strain: Low Risk    Difficulty of Paying Living Expenses: Not very hard  Food Insecurity: No Food Insecurity   Worried About Charity fundraiser in the Last Year: Never true   Ran Out of Food in the Last Year: Never true  Transportation Needs: No Transportation Needs   Lack of Transportation (Medical): No   Lack of Transportation (Non-Medical): No  Physical Activity: Inactive   Days of Exercise per Week: 0 days   Minutes of Exercise per Session: 0 min  Stress: Stress Concern Present   Feeling of Stress : To some extent  Social Connections: Not on file  Intimate Partner  Violence: Not At Risk   Fear of Current or Ex-Partner: No   Emotionally Abused: No   Physically Abused: No   Sexually Abused: No    Medications:   Current Outpatient Medications on File Prior to Visit  Medication Sig Dispense Refill   acetaminophen (TYLENOL) 650 MG CR tablet Take 650 mg by mouth every 8 (eight) hours as needed for pain.     aspirin EC 81 MG tablet Take by mouth daily.     Blood Glucose Monitoring Suppl (ONETOUCH VERIO) w/Device KIT by Does not apply route. Use to check fasting blood sugar daily and an occasional 2 hr postprandial  Carboxymethylcellulose Sodium (THERATEARS OP) Place 1 drop into both eyes at bedtime as needed.     clobetasol ointment (TEMOVATE) 1.61 % Apply 1 application topically daily as needed (psoriasis flares). Please given in jars instead of tubes 45 g 1   clobetasol ointment (TEMOVATE) 0.05 % Apply to affected area every night for 4 weeks, then every other day for 4 weeks and then twice a week for 4 weeks or until resolution. 60 g 5   Dulaglutide (TRULICITY) 0.96 EA/5.4UJ SOPN Inject into the skin once a week.     Emollient (CERAVE) CREA Apply 1 application topically daily as needed (psoriasis).     ezetimibe (ZETIA) 10 MG tablet Take 1 tablet (10 mg total) by mouth daily. 90 tablet 3   famotidine (PEPCID) 20 MG tablet Take 20 mg by mouth daily as needed for heartburn or indigestion.     Flaxseed, Linseed, (FLAX SEED OIL PO) Take 1 capsule by mouth daily.     fluticasone (FLONASE) 50 MCG/ACT nasal spray Place 1 spray into both nostrils daily as needed for allergies.     glucose blood (ONETOUCH VERIO) test strip Use to check fasting blood sugar daily and an occasional 2 hr postprandial. 100 each 11   ibuprofen (ADVIL,MOTRIN) 200 MG tablet Take 600-800 mg by mouth every 6 (six) hours as needed for moderate pain.     Lancets (ONETOUCH ULTRASOFT) lancets Use to check fasting blood sugar daily and an occasional 2 hr postprandial 100 each 11    levothyroxine (SYNTHROID) 75 MCG tablet TAKE 1 TABLET DAILY 90 tablet 3   losartan (COZAAR) 50 MG tablet Take 1 tablet (50 mg total) by mouth daily. 90 tablet 3   MAGNESIUM PO Take by mouth daily.     Multiple Vitamin (MULTIVITAMIN) tablet Take 1 tablet by mouth every other day.      nitroGLYCERIN (NITROSTAT) 0.4 MG SL tablet Place 1 tablet (0.4 mg total) under the tongue every 5 (five) minutes as needed. 30 tablet 6   Skin Protectants, Misc. (EUCERIN) cream Apply 1 application topically See admin instructions. Eczema relief applies to arms and hands daily     triamcinolone cream (KENALOG) 0.1 % Apply 1 application topically 2 (two) times daily as needed (psoriasis).     Vitamin D, Cholecalciferol, 1000 units TABS Take 2,000 Units by mouth daily.     No current facility-administered medications on file prior to visit.    Allergies:   Allergies  Allergen Reactions   Other Rash    Metal    Celexa [Citalopram] Other (See Comments)    Throat swelling and difficulty breathing   Codeine Nausea And Vomiting   Metformin Nausea And Vomiting   Robaxin [Methocarbamol] Nausea Only   Crestor [Rosuvastatin] Rash   Simvastatin Swelling and Rash    Physical Exam General: well developed, well nourished elderly Caucasian lady, seated, in no evident distress Head: head normocephalic and atraumatic.   Neck: supple with no carotid or supraclavicular bruits Cardiovascular: regular rate and rhythm, no murmurs Musculoskeletal: no deformity tenderness left occipital groove Skin:  no rash/petichiae Vascular:  Normal pulses all extremities no tenderness of superficial temporal artery.  Neurologic Exam Mental Status: Awake and fully alert. Oriented to place and time. Recent and remote memory intact. Attention span, concentration and fund of knowledge appropriate. Mood and affect appropriate.  Initially called.  Mini-Mental status exam score 26/30 .  Geriatric depression scale scored 6 Cranial Nerves:  Fundoscopic exam reveals sharp disc margins. Pupils equal, briskly reactive  to light. Extraocular movements full without nystagmus. Visual fields full to confrontation. Hearing intact. Facial sensation intact. Face, tongue, palate moves normally and symmetrically.  Motor: Normal bulk and tone. Normal strength in all tested extremity muscles. Sensory.: intact to touch , pinprick , position and vibratory sensation.  Coordination: Rapid alternating movements normal in all extremities. Finger-to-nose and heel-to-shin performed accurately bilaterally. Gait and Station: Arises from chair without difficulty. Stance is normal. Gait demonstrates normal stride length and balance . Able to heel, toe and tandem walk with moderate difficulty.  Reflexes: 1+ and symmetric. Toes downgoing.    MMSE - Mini Mental State Exam 09/16/2021 10/07/2020 09/25/2018  Orientation to time _0 Orientation to Place _1 Registration _2 Attention/ Calculation 2 5 0  Recall _3 Language- name 2 objects 2 - 0  Language- repeat _4 Language- follow 3 step command 3 - 3  Language- read & follow direction 1 - 0  Write a sentence 1 - 0  Copy design 1 - 0  Total score 26 - 20    ASSESSMENT: 75 year old pleasant Caucasian lady with transient episode of vision and cognitive difficulties, numbness possibly posterior circulation TIAs versus atypical migraine.  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    PLAN: I had a long discussion with the patient and the daughter regarding her episode of transient blurred vision and numbness possibly TIA versus atypical migraine and she seems to be doing well and has had no further episodes.  Discussed results of CT angiograms and temporal artery ultrasound and answered questions.  She is also having mild short-term memory difficulties likely due to mild age-related cognitive impairment.  I recommend she increase participation in cognitively  challenging activities like solving crossword puzzles, playing bridge and sodoku and we discussed memory compensation strategies.  Continue aspirin for stroke prevention and maintain aggressive risk factor modification.  Add co-Q10 200 mg daily for her myalgias.  She will return for follow-up in 6 months or call earlier if necessary.Greater than 50% time during this 30-minute  visit was spent on counseling and coordination of care about her episode of TIA versus atypical migraine and discussion about headaches and answering questions Antony Contras, MD Note: This document was prepared with digital dictation and possible smart phrase technology. Any transcriptional errors that result from this process are unintentional.

## 2021-09-17 ENCOUNTER — Ambulatory Visit: Payer: Medicare Other

## 2021-09-17 ENCOUNTER — Other Ambulatory Visit: Payer: Self-pay

## 2021-09-17 DIAGNOSIS — M25512 Pain in left shoulder: Secondary | ICD-10-CM

## 2021-09-17 DIAGNOSIS — M6281 Muscle weakness (generalized): Secondary | ICD-10-CM | POA: Diagnosis not present

## 2021-09-17 DIAGNOSIS — M25511 Pain in right shoulder: Secondary | ICD-10-CM

## 2021-09-17 DIAGNOSIS — G8929 Other chronic pain: Secondary | ICD-10-CM | POA: Diagnosis not present

## 2021-09-17 NOTE — Therapy (Signed)
[place] [facility] [address] [place], [place], [address] Phone: [phone]   Fax:  [phone]  Physical Therapy Treatment/Discharge   Patient Details  Name: Renee [name] MRN: [id number] Date of Birth: 1946-01-03 Referring Provider (PT): Bo Merino   Encounter Date: 09/17/2021   PT End of Session - 09/17/21 1153     Visit Number 12    Number of Visits 17    Date for PT Re-Evaluation 10/01/21    Authorization Type eval: 08/06/21    PT Start Time 1148    PT Stop Time 1230    PT Time Calculation (min) 42 min    Activity Tolerance Patient tolerated treatment well    Behavior During Therapy [facility] for tasks assessed/performed              Past Medical History:  Diagnosis Date   Allergy    Anxiety    Arthritis    CAD (coronary artery disease)    non-obstructive. cath 11/11   Diabetes mellitus    borderline diabetic - diet controlled    History of chicken pox    History of phobia    clastrophobia   Hyperlipidemia    Hypertension    Hypothyroidism    Kidney stones    hx of    Lichen sclerosus    Psoriasis    Stroke ([place])    Urinary tract infection    hx of     Past Surgical History:  Procedure Laterality Date   APPENDECTOMY     CARDIAC CATHETERIZATION     2011   CARPAL TUNNEL RELEASE     Left trigger finger   CATARACT EXTRACTION W/PHACO Left 06/06/2018   Procedure: CATARACT EXTRACTION PHACO AND INTRAOCULAR LENS PLACEMENT ([place]);  Surgeon: Birder Robson, MD;  Location: [facility] ORS;  Service: Ophthalmology;  Laterality: Left;  [place]  00:23 AP% 16.0 CDE 3.69 Fluid pack lot # [id number]   CATARACT EXTRACTION W/PHACO Right 09/19/2018   Procedure: CATARACT EXTRACTION PHACO AND INTRAOCULAR LENS PLACEMENT ([facility]);  Surgeon: Birder Robson, MD;  Location: [facility] ORS;  Service: Ophthalmology;  Laterality: Right;  [place] 00:29.5 AP% 17.0 CDE$ 5.03 Fluid pack lot # [id number]   COLONOSCOPY WITH PROPOFOL N/A 05/13/2017    Procedure: COLONOSCOPY WITH PROPOFOL;  Surgeon: Manya Silvas, MD;  Location: [facility] ENDOSCOPY;  Service: Endoscopy;  Laterality: N/A;   ESOPHAGOGASTRODUODENOSCOPY (EGD) WITH PROPOFOL N/A 01/04/2020   Procedure: ESOPHAGOGASTRODUODENOSCOPY (EGD) WITH PROPOFOL;  Surgeon: [name], MD;  Location: [facility] END[facility]COPY;  Service: Gastroenterology;  Laterality: N/A;   EYE SURGERY  2004   lasik - right eye    JOINT REPLACEMENT     KNEE SURGERY Right 2015   arthroscopy   TOTAL KNEE ARTHROPLASTY Bilateral 06/12/2015   Procedure: TOTAL KNEE BILATERAL;  Surgeon: Gaynelle Arabian, MD;  Location: [facility] ORS;  Service: Orthopedics;  Laterality: Bilateral;  and epidural   TUBAL LIGATION     Tubes tied     and untied. and tied again    There were no vitals filed for this visit.   Subjective Assessment - 09/17/21 1152     Subjective Patient reports she is doing alright today. She is going to see orthopedics about her R shoulder pain and wants to make today her last therapy appontment. She reports 8/10 R shoulder pain and 6/10 L shoulder pain upon arrival. No specific questions currently.    Pertinent History Pt reports that she has a history of bilateral shoulder/arm pain for the  last 2.5 years. No history of trauma. "The pain occurs in the muscles." She struggles with pain when lifting her arms. Pain occasionally wakes her up at night. She also complains of occasional neck pain and reports that she was previously diagnosed with occipital neuralgia. No back pain and pt denies pain in other joints. Denies any redness, swelling, or warmth in shoulders. She reports that she has tried OTC pain relievers and Voltaren gel without significant relief. Per medical record plain film radiographs of shoulders show mild degenerative changes in the R shoulder and no findings in L shoulder. Cervical radiographs showed DDD and facet joint arthropathy. MD diagnosed her with subacromial bursitis and referred her for physical therapy. Return  appointment with MD is in September. Years ago she started taking a cholesterol medication and had full body aches. She stopped and her pain resolved so now she is on a non-statin cholesterol medication. PMH includes psoriasis, DM, and bilateral TKR (2016).                 TREATMENT   Ther-ex  UBE x 5 minutes (2.5 minutes forward/2.5 minutes backward) for warm-up during history; Nautilus lat pull down 40# x 10, pt reports excessive strain on R shoulder so deferred second set; Nautilus rows 50# 2 x 10; Nautilus shoulder extension 30# 2 x 10; Nautilus tricep extension 30# 2 x 10; Nautilus chest press 40# 2 x 10; Nautilus shoulder IR and ER 10# x 10 each bilateral; HEP reviewed with patient;   Pt educated throughout session about proper posture and technique with exercises. Improved exercise technique, movement at target joints, use of target muscles after min to mod verbal, visual, tactile cues.    Pt arrives with excellent motivation to participate in therapy.  Updated outcome measures and goals with patient during therapy session.  Her worst shoulder pain has decreased from 9/10 bilaterally at initial evaluation to 7/10 on the left and remains at 9/10 on the right. However one week ago she reported 4/10 on the left and 7/10 on the right.  Her FOTO score improved from 48 at initial evaluation to 57 today and overall she reports 60% improvement in symptoms in left shoulder and 50% improvement symptoms in right shoulder since starting physical therapy.  Patient continues to struggle with significant right shoulder pain and is planning to see orthopedics.  She would like to stop physical therapy until after she is consulted with orthopedics. Patient encouraged to continue her HEP and will be discharged on this date.                    PT Short Term Goals - 09/17/21 1308       PT SHORT TERM GOAL #1   Title Pt will be independent with HEP in order to improve shoulder  range of motion and strength as well as decrease pain in order to improve pain-free function at home.    Time 4    Period Weeks    Status Achieved    Target Date 09/03/21               PT Long Term Goals - 09/17/21 1153       PT LONG TERM GOAL #1   Title Pt will decrease worst bilateral shoulder pain as reported on NPRS by at least 3 points in order to demonstrate clinically significant reduction in shoulder pain.    Baseline 08/06/21: Worst: 9/10; 09/10/21: L: 4/10; R: 7/10; 09/17/21: R: 9/10 L: 7/10;  Time 8    Period Weeks    Status Partially Met      PT LONG TERM GOAL #2   Title Pt will increase FOTO to at 62 least in order to demonstrate significant improvement in function related to bilateral shoulder pain    Baseline 08/06/21: 48; 09/10/21: 65; 09/17/21: 57    Time 8    Period Weeks    Status Partially Met      PT LONG TERM GOAL #3   Title Pt will report at least 50% improvement in her symptoms in order to improve pain-free function at home    Baseline 09/10/21: L: 60% improvement, R: 50% improvement; 09/17/21: L: 60% improvement, R: 50% improvement    Time 8    Period Weeks    Status Achieved                   Plan - 09/17/21 1223     Clinical Impression Statement Pt arrives with excellent motivation to participate in therapy.  Updated outcome measures and goals with patient during therapy session.  Her worst shoulder pain has decreased from 9/10 bilaterally at initial evaluation to 7/10 on the left and remains at 9/10 on the right. However one week ago she reported 4/10 on the left and 7/10 on the right.  Her FOTO score improved from 48 at initial evaluation to 57 today and overall she reports 60% improvement in symptoms in left shoulder and 50% improvement symptoms in right shoulder since starting physical therapy.  Patient continues to struggle with significant right shoulder pain and is planning to see orthopedics.  She would like to stop physical therapy until  after she is consulted with orthopedics. Patient encouraged to continue her HEP and will be discharged on this date.    Personal Factors and Comorbidities Age;Comorbidity 3+;Past/Current Experience;Time since onset of injury/illness/exacerbation    Comorbidities psoriasis, anxiety, DM    Examination-Activity Limitations Reach Overhead    Examination-Participation Restrictions Meal Prep;Laundry;Cleaning;Shop    Stability/Clinical Decision Making Evolving/Moderate complexity    Rehab Potential Good    PT Frequency 2x / week    PT Duration 8 weeks    PT Treatment/Interventions ADLs/Self Care Home Management;Aquatic Therapy;Biofeedback;Canalith Repostioning;Cryotherapy;Electrical Stimulation;Iontophoresis 24m/ml Dexamethasone;Moist Heat;Traction;Ultrasound;Gait training;Functional mobility training;Therapeutic activities;Therapeutic exercise;Balance training;Neuromuscular re-education;Manual techniques;Dry needling;Vestibular;Spinal Manipulations;Joint Manipulations;Passive range of motion    PT Next Visit Plan Discharge    PT Home Exercise Plan Access Code: [email or web address]   Consulted and Agree with Plan of Care Patient               Patient will benefit from skilled therapeutic intervention in order to improve the following deficits and impairments:  Pain, Decreased range of motion, Decreased strength  Visit Diagnosis: Chronic right shoulder pain  Chronic left shoulder pain  Muscle weakness (generalized)     Problem List Patient Active Problem List   Diagnosis Date Noted   Acute gout involving toe of right foot 08/12/2021   Chronic pain of both shoulders 08/12/2021   History of TIA (transient ischemic attack) 06/26/2021   Statin intolerance 01/15/2021   Myalgia 12/22/2020   MDD (major depressive disorder), recurrent episode, moderate (HCC) 07/08/2020   Chronic insomnia 07/08/2020   Chronic neck pain 10/05/2019   Chronic diarrhea 03/30/2018   GAD (generalized anxiety disorder)  01/31/2018   Osteopenia 12/08/2017   Vitamin D deficiency 03/04/2016   Allergic rhinitis 03/04/2016   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)  ([place] 10/20/2015  External hemorrhoid 10/20/2015   Psoriasis 10/20/2015   History of bilateral knee replacement 10/20/2015   Osteoarthritis of knee 11/25/2014   Hypertension associated with diabetes ([place]) 11/06/2010   CAD, NATIVE VESSEL 11/06/2010   Hyperlipidemia associated with type 2 diabetes mellitus ([place]) 01/25/2006   Adult hypothyroidism 11/15/2005   Phillips Grout PT, DPT, GCS  Abigail Teall 09/17/2021, 3:58 PM  [place] [facility] [address]. [place], [place], [address] Phone: [phone]   Fax:  [phone]  Name: ELOYSE CAUSEY MRN: [id number] Date of Birth: 17-Dec-1946

## 2021-09-29 ENCOUNTER — Ambulatory Visit: Payer: Medicare Other | Admitting: Neurology

## 2021-10-03 DIAGNOSIS — Z23 Encounter for immunization: Secondary | ICD-10-CM | POA: Diagnosis not present

## 2021-10-08 ENCOUNTER — Telehealth: Payer: Self-pay

## 2021-10-08 NOTE — Chronic Care Management (AMB) (Addendum)
Chronic Care Management Pharmacy Assistant   Name: Renee [name]  MRN: [id number] DOB: [birth year]  Reason for Encounter: General Adherence   Recent office visits:  08/21/21-PCP-Patient presented for follow up diabetes. Steroid and trigger point injections bilateral shoulders. Stop glipizide in 2 weeks if CBG's baseline.  Recent consult visits:  09/16/21-Neurology-Patient presented for follow up TIA.  Continue aspirin for stroke prevention and maintain aggressive risk factor modification.  Add co-Q10 200 mg daily for her myalgias. Participate in cognitive challenging activities. Keep memory notebook. Follow up 6 months. 08/21/21-Rheumatology-Patient presented for multiple joint pain.Xrays and previous labs reviewed. Right subacromial bursa injection, left side trapezius injection.Follow up 3 months.  Hospital visits:  None in previous 6 months  Medications: Outpatient Encounter Medications as of 10/08/2021  Medication Sig Note   acetaminophen (TYLENOL) 650 MG CR tablet Take 650 mg by mouth every 8 (eight) hours as needed for pain.    aspirin EC 81 MG tablet Take by mouth daily.    Blood Glucose Monitoring Suppl (ONETOUCH VERIO) w/Device KIT by Does not apply route. Use to check fasting blood sugar daily and an occasional 2 hr postprandial    Carboxymethylcellulose Sodium (THERATEARS OP) Place 1 drop into both eyes at bedtime as needed.    clobetasol ointment (TEMOVATE) 0.05 % Apply 1 application topically daily as needed (psoriasis flares). Please given in jars instead of tubes    clobetasol ointment (TEMOVATE) 0.05 % Apply to affected area every night for 4 weeks, then every other day for 4 weeks and then twice a week for 4 weeks or until resolution.    co-enzyme Q-10 30 MG capsule Take 200 mg daily    Dulaglutide (TRULICITY) 0.75 MG/0.5ML SOPN Inject into the skin once a week.    Emollient (CERAVE) CREA Apply 1 application topically daily as needed (psoriasis). 11/17/2016: Mix with  triamcinolone   ezetimibe (ZETIA) 10 MG tablet Take 1 tablet (10 mg total) by mouth daily.    famotidine (PEPCID) 20 MG tablet Take 20 mg by mouth daily as needed for heartburn or indigestion.    Flaxseed, Linseed, (FLAX SEED OIL PO) Take 1 capsule by mouth daily.    fluticasone (FLONASE) 50 MCG/ACT nasal spray Place 1 spray into both nostrils daily as needed for allergies.    glucose blood (ONETOUCH VERIO) test strip Use to check fasting blood sugar daily and an occasional 2 hr postprandial.    ibuprofen (ADVIL,MOTRIN) 200 MG tablet Take 600-800 mg by mouth every 6 (six) hours as needed for moderate pain.    Lancets (ONETOUCH ULTRASOFT) lancets Use to check fasting blood sugar daily and an occasional 2 hr postprandial    levothyroxine (SYNTHROID) 75 MCG tablet TAKE 1 TABLET DAILY    losartan (COZAAR) 50 MG tablet Take 1 tablet (50 mg total) by mouth daily.    MAGNESIUM PO Take by mouth daily.    Multiple Vitamin (MULTIVITAMIN) tablet Take 1 tablet by mouth every other day.     nitroGLYCERIN (NITROSTAT) 0.4 MG SL tablet Place 1 tablet (0.4 mg total) under the tongue every 5 (five) minutes as needed.    Skin Protectants, Misc. (EUCERIN) cream Apply 1 application topically See admin instructions. Eczema relief applies to arms and hands daily    triamcinolone cream (KENALOG) 0.1 % Apply 1 application topically 2 (two) times daily as needed (psoriasis).    Vitamin D, Cholecalciferol, 1000 units TABS Take 2,000 Units by mouth daily.    No facility-administered encounter medications on file  as of 10/08/2021.      Contacted CHARITIE HINOTE on 10/08/21 for general disease state and medication adherence call.   Patient is not > 5 days past due for refill on the following medications per chart history:  [facility] Medications: Medication Name/mg Last Fill Days Supply  Trulicity                        (PAP)    Patient reports 90ds on hand  Losartan $RemoveB'50mg'MvbCPEUz$              07/14/21            90  What concerns do  you have about your medications? No concerns at this time   The patient denies side effects with her medications.   How often do you forget or accidentally miss a dose? Rarely She has had some memory issues in the past .  Do you use a pillbox? Yes  The patient reports her daughter helps put out 1 week at a time .   Are you having any problems getting your medications from your pharmacy? No  She will use [facility] and  [facility]  Has the cost of your medications been a concern? No  Since last visit with CPP, the following interventions have been made:The patient reports she was able to stop Glipizide due to BG's much improved:  since 09/26/21   Fasting 116,117,123,125,121,116  The patient has not had an ED visit since last contact.   The patient denies problems with their health.   she denies  concerns or questions for Debbora Dus, Pharm. D at this time.The patient states to thank [facility] for all her help with managing BG.  Counseled patient on:  [place] job taking medications, Importance of taking medication daily without missed doses, Benefits of adherence packaging or a pillbox, and Access to CCM team for any cost, medication or pharmacy concerns.   Care Gaps: Annual wellness visit in last year? Yes 10/14/20 Most Recent BP reading:131/74  84-P  If Diabetic: Most recent A1C reading:6.6  08/14/21 Last eye exam / retinopathy screening:05/20/21 Last diabetic foot exam: 10/06/21  PCP appointment on 12/01/21, CCM appointment on 12/08/21, and Cardiology appointment on 12/23/21  Debbora Dus, CPP notified  Avel Sensor, [place] Assistant [phone]  I have reviewed the care management and care coordination activities outlined in this encounter and I am certifying that I agree with the content of this note. No further action required.  Debbora Dus, PharmD Clinical Pharmacist [facility] at [facility] [phone]

## 2021-10-10 ENCOUNTER — Other Ambulatory Visit: Payer: Self-pay | Admitting: Family Medicine

## 2021-10-21 DIAGNOSIS — M19011 Primary osteoarthritis, right shoulder: Secondary | ICD-10-CM | POA: Diagnosis not present

## 2021-10-21 DIAGNOSIS — M25512 Pain in left shoulder: Secondary | ICD-10-CM | POA: Diagnosis not present

## 2021-10-21 DIAGNOSIS — M25511 Pain in right shoulder: Secondary | ICD-10-CM | POA: Diagnosis not present

## 2021-10-22 ENCOUNTER — Ambulatory Visit (INDEPENDENT_AMBULATORY_CARE_PROVIDER_SITE_OTHER): Payer: Medicare Other | Admitting: Podiatry

## 2021-10-22 ENCOUNTER — Other Ambulatory Visit: Payer: Self-pay

## 2021-10-22 DIAGNOSIS — Q828 Other specified congenital malformations of skin: Secondary | ICD-10-CM

## 2021-10-27 NOTE — Progress Notes (Signed)
Subjective:  Patient ID: Renee [name], female    DOB: 1946-12-13,  MRN: [id number]  Chief Complaint  Patient presents with   Callouses    Bilateral     75 y.o. female presents with the above complaint.  Patient presents with complaint of bilateral fifth metatarsal hyperkeratotic lesion.  Patient stated they are still very painful but the debridement does help.  She does not have any secondary complaint.  The lesions are very painful to ambulate on.  Pain scale 6 out of 10 sharp shooting in nature   Review of Systems: Negative except as noted in the HPI. Denies N/V/F/Ch.  Past Medical History:  Diagnosis Date   Allergy    Anxiety    Arthritis    CAD (coronary artery disease)    non-obstructive. cath 11/11   Diabetes mellitus    borderline diabetic - diet controlled    History of chicken pox    History of phobia    clastrophobia   Hyperlipidemia    Hypertension    Hypothyroidism    Kidney stones    hx of    Lichen sclerosus    Psoriasis    Stroke ([place])    Urinary tract infection    hx of     Current Outpatient Medications:    acetaminophen (TYLENOL) 650 MG CR tablet, Take 650 mg by mouth every 8 (eight) hours as needed for pain., Disp: , Rfl:    aspirin EC 81 MG tablet, Take by mouth daily., Disp: , Rfl:    Blood Glucose Monitoring Suppl (ONETOUCH VERIO) w/Device KIT, by Does not apply route. Use to check fasting blood sugar daily and an occasional 2 hr postprandial, Disp: , Rfl:    Carboxymethylcellulose Sodium (THERATEARS OP), Place 1 drop into both eyes at bedtime as needed., Disp: , Rfl:    clobetasol ointment (TEMOVATE) 1.95 %, Apply 1 application topically daily as needed (psoriasis flares). Please given in jars instead of tubes, Disp: 45 g, Rfl: 1   clobetasol ointment (TEMOVATE) 0.05 %, Apply to affected area every night for 4 weeks, then every other day for 4 weeks and then twice a week for 4 weeks or until resolution., Disp: 60 g, Rfl: 5   co-enzyme Q-10 30 MG  capsule, Take 200 mg daily, Disp: 1 capsule, Rfl: 1   Dulaglutide (TRULICITY) 0.93 OI/7.1IW SOPN, Inject into the skin once a week., Disp: , Rfl:    Emollient (CERAVE) CREA, Apply 1 application topically daily as needed (psoriasis)., Disp: , Rfl:    ezetimibe (ZETIA) 10 MG tablet, Take 1 tablet (10 mg total) by mouth daily., Disp: 90 tablet, Rfl: 3   famotidine (PEPCID) 20 MG tablet, Take 20 mg by mouth daily as needed for heartburn or indigestion., Disp: , Rfl:    Flaxseed, Linseed, (FLAX SEED OIL PO), Take 1 capsule by mouth daily., Disp: , Rfl:    fluticasone (FLONASE) 50 MCG/ACT nasal spray, Place 1 spray into both nostrils daily as needed for allergies., Disp: , Rfl:    glucose blood (ONETOUCH VERIO) test strip, Use to check fasting blood sugar daily and an occasional 2 hr postprandial., Disp: 100 each, Rfl: 11   ibuprofen (ADVIL,MOTRIN) 200 MG tablet, Take 600-800 mg by mouth every 6 (six) hours as needed for moderate pain., Disp: , Rfl:    Lancets (ONETOUCH ULTRASOFT) lancets, Use to check fasting blood sugar daily and an occasional 2 hr postprandial, Disp: 100 each, Rfl: 11   levothyroxine (SYNTHROID) 75 MCG tablet,  TAKE 1 TABLET BY MOUTH EVERY DAY, Disp: 90 tablet, Rfl: 0   losartan (COZAAR) 50 MG tablet, TAKE 1 TABLET BY MOUTH EVERY DAY, Disp: 90 tablet, Rfl: 1   MAGNESIUM PO, Take by mouth daily., Disp: , Rfl:    Multiple Vitamin (MULTIVITAMIN) tablet, Take 1 tablet by mouth every other day. , Disp: , Rfl:    nitroGLYCERIN (NITROSTAT) 0.4 MG SL tablet, Place 1 tablet (0.4 mg total) under the tongue every 5 (five) minutes as needed., Disp: 30 tablet, Rfl: 6   Skin Protectants, Misc. (EUCERIN) cream, Apply 1 application topically See admin instructions. Eczema relief applies to arms and hands daily, Disp: , Rfl:    triamcinolone cream (KENALOG) 0.1 %, Apply 1 application topically 2 (two) times daily as needed (psoriasis)., Disp: , Rfl:    Vitamin D, Cholecalciferol, 1000 units TABS, Take  2,000 Units by mouth daily., Disp: , Rfl:   Social History   Tobacco Use  Smoking Status Never  Smokeless Tobacco Never    Allergies  Allergen Reactions   Other Rash    Metal    Celexa [Citalopram] Other (See Comments)    Throat swelling and difficulty breathing   Codeine Nausea And Vomiting   Metformin Nausea And Vomiting   Robaxin [Methocarbamol] Nausea Only   Crestor [Rosuvastatin] Rash   Simvastatin Swelling and Rash   Objective:  There were no vitals filed for this visit. There is no height or weight on file to calculate BMI. Constitutional Well developed. Well nourished.  Vascular Dorsalis pedis pulses palpable bilaterally. Posterior tibial pulses palpable bilaterally. Capillary refill normal to all digits.  No cyanosis or clubbing noted. Pedal hair growth normal.  Neurologic Normal speech. Oriented to person, place, and time. Epicritic sensation to light touch grossly present bilaterally.  Dermatologic Nail Exam: Pt has thick disfigured discolored nails with subungual debris noted bilateral entire nail hallux through fifth toenails.  Pain on palpation to the nails. No open wounds. No skin lesions.  Orthopedic: Bilateral submetatarsal 5 porokeratotic/benign skin lesion with central nucleated core noted.  Upon debridement no pinpoint bleeding noted.  No wounds noted.   Radiographs:   3 views of skeletally mature adult bilateral foot: Midfoot arthritis noted.  Plantar and posterior heel spurring noted.  Plantarflexed fifth metatarsal noted with mild signs of tailor's bunion.  There is increase in lateral deviation angle. Assessment:   1. Porokeratosis      Plan:  Patient was evaluated and treated and all questions answered.  Bilateral submetatarsal 5 porokeratosis x2 -X-ray to the patient the etiology of porokeratosis and was treatment options were extensively discussed.  Given the amount of pain she is having I believe she will benefit from debridement of the  lesion followed by excision of the lesion.  I discussed this with the patient she states understanding like to proceed with that. -Using chisel blade and handle the lesion was debrided down to healthy striated tissue followed by excision of central nucleated core.  Immediate pain was noted after debridement and excision.  No pinpoint bleeding noted.  Onychomycosis with pain  -Nails palliatively debrided as below. -Educated on self-care  Procedure: Nail Debridement Rationale: pain  Type of Debridement: manual, sharp debridement. Instrumentation: Nail nipper, rotary burr. Number of Nails: 10  Procedures and Treatment: Consent by patient was obtained for treatment procedures. The patient understood the discussion of treatment and procedures well. All questions were answered thoroughly reviewed. Debridement of mycotic and hypertrophic toenails, 1 through 5 bilateral and clearing of  subungual debris. No ulceration, no infection noted.  Return Visit-Office Procedure: Patient instructed to return to the office for a follow up visit 3 months for continued evaluation and treatment.  Boneta Lucks, DPM    No follow-ups on file.     No follow-ups on file.

## 2021-11-02 ENCOUNTER — Other Ambulatory Visit: Payer: Self-pay | Admitting: Family Medicine

## 2021-11-02 NOTE — Telephone Encounter (Signed)
Last office visit 08/21/2021 for DM.  Refill request states: The original prescription was discontinued on 09/16/2021 by Andre Lefort, CMA for the following reason: Error. Renewing this prescription may not be appropriate.  Per 10/08/21 Pharmacy note:  Since last visit with CPP, the following interventions have been made:The patient reports she was able to stop Glipizide due to BG's much improved:  since 09/26/21   Fasting 116,117,123,125,121,116.  Ok to deny refill?

## 2021-11-03 ENCOUNTER — Other Ambulatory Visit: Payer: Self-pay | Admitting: Family Medicine

## 2021-11-03 NOTE — Telephone Encounter (Signed)
Last office visit 08/21/2021 for DM.  Last refilled 05/20/2021 for #60 g with 5 refills.  CPE scheduled for 12/01/2021.

## 2021-11-09 ENCOUNTER — Ambulatory Visit: Payer: Medicare Other | Admitting: Neurology

## 2021-11-26 ENCOUNTER — Telehealth: Payer: Self-pay | Admitting: Family Medicine

## 2021-11-26 ENCOUNTER — Other Ambulatory Visit: Payer: Medicare Other

## 2021-11-26 ENCOUNTER — Ambulatory Visit: Payer: Medicare Other | Admitting: Physician Assistant

## 2021-11-26 DIAGNOSIS — E1159 Type 2 diabetes mellitus with other circulatory complications: Secondary | ICD-10-CM

## 2021-11-26 DIAGNOSIS — E039 Hypothyroidism, unspecified: Secondary | ICD-10-CM

## 2021-11-26 NOTE — Telephone Encounter (Signed)
-----   Message from Ellamae Sia sent at 11/10/2021  3:42 PM EST ----- Regarding: Lab orders for Thursday, 12.1.22 Patient is scheduled for CPX labs, please order future labs, Thanks , Karna Christmas

## 2021-11-27 ENCOUNTER — Other Ambulatory Visit (INDEPENDENT_AMBULATORY_CARE_PROVIDER_SITE_OTHER): Payer: Medicare Other

## 2021-11-27 ENCOUNTER — Other Ambulatory Visit: Payer: Self-pay

## 2021-11-27 DIAGNOSIS — E1159 Type 2 diabetes mellitus with other circulatory complications: Secondary | ICD-10-CM | POA: Diagnosis not present

## 2021-11-27 DIAGNOSIS — E039 Hypothyroidism, unspecified: Secondary | ICD-10-CM | POA: Diagnosis not present

## 2021-11-27 NOTE — Addendum Note (Signed)
Addended by: Neta Ehlers on: 11/27/2021 01:24 PM   Modules accepted: Orders

## 2021-11-27 NOTE — Addendum Note (Signed)
Addended by: Neta Ehlers on: 11/27/2021 01:25 PM   Modules accepted: Orders

## 2021-11-28 LAB — HEMOGLOBIN A1C
Hgb A1c MFr Bld: 6.3 % of total Hgb — ABNORMAL HIGH (ref <5.7)
Mean Plasma Glucose: 134 mg/dL
eAG (mmol/L): 7.4 mmol/L

## 2021-11-28 LAB — T4, FREE: Free T4: 1.3 ng/dL (ref 0.8–1.8)

## 2021-11-28 LAB — T3, FREE: T3, Free: 2.6 pg/mL (ref 2.3–4.2)

## 2021-11-28 LAB — TSH: TSH: 2.67 mIU/L (ref 0.40–4.50)

## 2021-11-30 ENCOUNTER — Ambulatory Visit (INDEPENDENT_AMBULATORY_CARE_PROVIDER_SITE_OTHER): Payer: Medicare Other

## 2021-11-30 VITALS — Ht 64.0 in | Wt 145.0 lb

## 2021-11-30 DIAGNOSIS — Z0000 Encounter for general adult medical examination without abnormal findings: Secondary | ICD-10-CM | POA: Diagnosis not present

## 2021-11-30 DIAGNOSIS — Z780 Asymptomatic menopausal state: Secondary | ICD-10-CM | POA: Diagnosis not present

## 2021-11-30 NOTE — Patient Instructions (Signed)
Renee [name] , Thank you for taking time to complete your Medicare Wellness Visit. I appreciate your ongoing commitment to your health goals. Please review the following plan we discussed and let me know if I can assist you in the future.   Screening recommendations/referrals: Colonoscopy: no longer required Mammogram: due, last completed 12/17/20, you have an up coming appointment 1/5 /23 Bone Density: due, last completed 12/02/17, ordered today, someone will call to schedule an appointment Recommended yearly ophthalmology/optometry visit for glaucoma screening and checkup Recommended yearly dental visit for hygiene and checkup  Vaccinations: Influenza vaccine: up to date Pneumococcal vaccine: up to date Tdap vaccine: up to date, completed 09/27/12 due , 09/27/22 Shingles vaccine: Discuss with your local pharmacy Covid-19: newest booster available at your local pharmacy  Advanced directives: information available at your next appointment  Conditions/risks identified: see problem  Next appointment: Follow up in one year for your annual wellness visit 12/02/22 @ 12:00pm, this will be a telephone visit   Preventive Care 65 Years and Older, Female Preventive care refers to lifestyle choices and visits with your health care provider that can promote health and wellness. What does preventive care include? A yearly physical exam. This is also called an annual well check. Dental exams once or twice a year. Routine eye exams. Ask your health care provider how often you should have your eyes checked. Personal lifestyle choices, including: Daily care of your teeth and gums. Regular physical activity. Eating a healthy diet. Avoiding tobacco and drug use. Limiting alcohol use. Practicing safe sex. Taking low-dose aspirin every day. Taking vitamin and mineral supplements as recommended by your health care provider. What happens during an annual well check? The services and screenings done by  your health care provider during your annual well check will depend on your age, overall health, lifestyle risk factors, and family history of disease. Counseling  Your health care provider may ask you questions about your: Alcohol use. Tobacco use. Drug use. Emotional well-being. Home and relationship well-being. Sexual activity. Eating habits. History of falls. Memory and ability to understand (cognition). Work and work Statistician. Reproductive health. Screening  You may have the following tests or measurements: Height, weight, and BMI. Blood pressure. Lipid and cholesterol levels. These may be checked every 5 years, or more frequently if you are over 47 years old. Skin check. Lung cancer screening. You may have this screening every year starting at age 21 if you have a 30-pack-year history of smoking and currently smoke or have quit within the past 15 years. Fecal occult blood test (FOBT) of the stool. You may have this test every year starting at age 65. Flexible sigmoidoscopy or colonoscopy. You may have a sigmoidoscopy every 5 years or a colonoscopy every 10 years starting at age 47. Hepatitis C blood test. Hepatitis B blood test. Sexually transmitted disease (STD) testing. Diabetes screening. This is done by checking your blood sugar (glucose) after you have not eaten for a while (fasting). You may have this done every 1-3 years. Bone density scan. This is done to screen for osteoporosis. You may have this done starting at age 27. Mammogram. This may be done every 1-2 years. Talk to your health care provider about how often you should have regular mammograms. Talk with your health care provider about your test results, treatment options, and if necessary, the need for more tests. Vaccines  Your health care provider may recommend certain vaccines, such as: Influenza vaccine. This is recommended every year. Tetanus, diphtheria, and  acellular pertussis (Tdap, Td) vaccine. You  may need a Td booster every 10 years. Zoster vaccine. You may need this after age 12. Pneumococcal 13-valent conjugate (PCV13) vaccine. One dose is recommended after age 2. Pneumococcal polysaccharide (PPSV23) vaccine. One dose is recommended after age 48. Talk to your health care provider about which screenings and vaccines you need and how often you need them. This information is not intended to replace advice given to you by your health care provider. Make sure you discuss any questions you have with your health care provider. Document Released: 01/09/2016 Document Revised: 09/01/2016 Document Reviewed: 10/14/2015 Elsevier Interactive Patient Education  2017 [place] Prevention in the Home Falls can cause injuries. They can happen to people of all ages. There are many things you can do to make your home safe and to help prevent falls. What can I do on the outside of my home? Regularly fix the edges of walkways and driveways and fix any cracks. Remove anything that might make you trip as you walk through a door, such as a raised step or threshold. Trim any bushes or trees on the path to your home. Use bright outdoor lighting. Clear any walking paths of anything that might make someone trip, such as rocks or tools. Regularly check to see if handrails are loose or broken. Make sure that both sides of any steps have handrails. Any raised decks and porches should have guardrails on the edges. Have any leaves, snow, or ice cleared regularly. Use sand or salt on walking paths during winter. Clean up any spills in your garage right away. This includes oil or grease spills. What can I do in the bathroom? Use night lights. Install grab bars by the toilet and in the tub and shower. Do not use towel bars as grab bars. Use non-skid mats or decals in the tub or shower. If you need to sit down in the shower, use a plastic, non-slip stool. Keep the floor dry. Clean up any water that spills  on the floor as soon as it happens. Remove soap buildup in the tub or shower regularly. Attach bath mats securely with double-sided non-slip rug tape. Do not have throw rugs and other things on the floor that can make you trip. What can I do in the bedroom? Use night lights. Make sure that you have a light by your bed that is easy to reach. Do not use any sheets or blankets that are too big for your bed. They should not hang down onto the floor. Have a firm chair that has side arms. You can use this for support while you get dressed. Do not have throw rugs and other things on the floor that can make you trip. What can I do in the kitchen? Clean up any spills right away. Avoid walking on wet floors. Keep items that you use a lot in easy-to-reach places. If you need to reach something above you, use a strong step stool that has a grab bar. Keep electrical cords out of the way. Do not use floor polish or wax that makes floors slippery. If you must use wax, use non-skid floor wax. Do not have throw rugs and other things on the floor that can make you trip. What can I do with my stairs? Do not leave any items on the stairs. Make sure that there are handrails on both sides of the stairs and use them. Fix handrails that are broken or loose. Make sure that  handrails are as long as the stairways. Check any carpeting to make sure that it is firmly attached to the stairs. Fix any carpet that is loose or worn. Avoid having throw rugs at the top or bottom of the stairs. If you do have throw rugs, attach them to the floor with carpet tape. Make sure that you have a light switch at the top of the stairs and the bottom of the stairs. If you do not have them, ask someone to add them for you. What else can I do to help prevent falls? Wear shoes that: Do not have high heels. Have rubber bottoms. Are comfortable and fit you well. Are closed at the toe. Do not wear sandals. If you use a stepladder: Make  sure that it is fully opened. Do not climb a closed stepladder. Make sure that both sides of the stepladder are locked into place. Ask someone to hold it for you, if possible. Clearly mark and make sure that you can see: Any grab bars or handrails. First and last steps. Where the edge of each step is. Use tools that help you move around (mobility aids) if they are needed. These include: Canes. Walkers. Scooters. Crutches. Turn on the lights when you go into a dark area. Replace any light bulbs as soon as they burn out. Set up your furniture so you have a clear path. Avoid moving your furniture around. If any of your floors are uneven, fix them. If there are any pets around you, be aware of where they are. Review your medicines with your doctor. Some medicines can make you feel dizzy. This can increase your chance of falling. Ask your doctor what other things that you can do to help prevent falls. This information is not intended to replace advice given to you by your health care provider. Make sure you discuss any questions you have with your health care provider. Document Released: 10/09/2009 Document Revised: 05/20/2016 Document Reviewed: 01/17/2015 Elsevier Interactive Patient Education  2017 [place]

## 2021-11-30 NOTE — Progress Notes (Signed)
Subjective:   Renee [name] is a 75 y.o. female who presents for Medicare Annual (Subsequent) preventive examination.  I connected with Panda Crossin today by telephone and verified that I am speaking with the correct person using two identifiers. Location patient: home Location provider: work Persons participating in the virtual visit: patient, Marine scientist.    I discussed the limitations, risks, security and privacy concerns of performing an evaluation and management service by telephone and the availability of in person appointments. I also discussed with the patient that there may be a patient responsible charge related to this service. The patient expressed understanding and verbally consented to this telephonic visit.    Interactive audio and video telecommunications were attempted between this provider and patient, however failed, due to patient having technical difficulties OR patient did not have access to video capability.  We continued and completed visit with audio only.  Some vital signs may be absent or patient reported.   Time Spent with patient on telephone encounter: 30 minutes  Review of Systems     Cardiac Risk Factors include: advanced age (>93mn, >>57women);diabetes mellitus;dyslipidemia;hypertension     Objective:    Today's Vitals   11/30/21 1314 11/30/21 1315  Weight: 145 lb (65.8 kg)   Height: _0  (1.626 m)   PainSc:  5    Body mass index is 24.89 kg/m.  Advanced Directives 11/30/2021 10/07/2020 01/04/2020 09/25/2018 09/19/2018 06/06/2018 03/28/2018  Does Patient Have a Medical Advance Directive? No Yes _1   Type of Advance Directive - [place] will - - - - -  Does patient want to make changes to medical advance directive? - - - - - - -  Copy of [place] Chart? - No - copy requested - - - - -  Would patient like information on creating a medical advance directive? Yes (MAU/Ambulatory/Procedural Areas -  Information given) - - No - Patient declined No - Patient declined No - Patient declined No - Patient declined    Current Medications (verified) Outpatient Encounter Medications as of 11/30/2021  Medication Sig   acetaminophen (TYLENOL) 650 MG CR tablet Take 650 mg by mouth every 8 (eight) hours as needed for pain.   aspirin EC 81 MG tablet Take by mouth daily.   Blood Glucose Monitoring Suppl (ONETOUCH VERIO) w/Device KIT by Does not apply route. Use to check fasting blood sugar daily and an occasional 2 hr postprandial   Carboxymethylcellulose Sodium (THERATEARS OP) Place 1 drop into both eyes at bedtime as needed.   clobetasol ointment (TEMOVATE) 0.05 % Apply to affected area every night for 4 weeks, then every other day for 4 weeks and then twice a week for 4 weeks or until resolution.   clobetasol ointment (TEMOVATE) 0.05 % APPLY TOPICALLY DAILY AS NEEDED FOR PSORIASIS FLARES   co-enzyme Q-10 30 MG capsule Take 200 mg daily   Dulaglutide (TRULICITY) 06.28MBT/5.1VOSOPN Inject into the skin once a week.   Emollient (CERAVE) CREA Apply 1 application topically daily as needed (psoriasis).   ezetimibe (ZETIA) 10 MG tablet Take 1 tablet (10 mg total) by mouth daily.   famotidine (PEPCID) 20 MG tablet Take 20 mg by mouth daily as needed for heartburn or indigestion.   Flaxseed, Linseed, (FLAX SEED OIL PO) Take 1 capsule by mouth daily.   fluticasone (FLONASE) 50 MCG/ACT nasal spray Place 1 spray into both nostrils daily as needed for allergies.   glucose blood (  ONETOUCH VERIO) test strip Use to check fasting blood sugar daily and an occasional 2 hr postprandial.   ibuprofen (ADVIL,MOTRIN) 200 MG tablet Take 600-800 mg by mouth every 6 (six) hours as needed for moderate pain.   Lancets (ONETOUCH ULTRASOFT) lancets Use to check fasting blood sugar daily and an occasional 2 hr postprandial   levothyroxine (SYNTHROID) 75 MCG tablet TAKE 1 TABLET BY MOUTH EVERY DAY   losartan (COZAAR) 50 MG tablet  TAKE 1 TABLET BY MOUTH EVERY DAY   MAGNESIUM PO Take by mouth daily.   Multiple Vitamin (MULTIVITAMIN) tablet Take 1 tablet by mouth every other day.    nitroGLYCERIN (NITROSTAT) 0.4 MG SL tablet Place 1 tablet (0.4 mg total) under the tongue every 5 (five) minutes as needed.   Skin Protectants, Misc. (EUCERIN) cream Apply 1 application topically See admin instructions. Eczema relief applies to arms and hands daily   triamcinolone cream (KENALOG) 0.1 % Apply 1 application topically 2 (two) times daily as needed (psoriasis).   Vitamin D, Cholecalciferol, 1000 units TABS Take 2,000 Units by mouth daily.   No facility-administered encounter medications on file as of 11/30/2021.    Allergies (verified) Other, Celexa [citalopram], Codeine, Metformin, Robaxin [methocarbamol], Crestor [rosuvastatin], and Simvastatin   History: Past Medical History:  Diagnosis Date   Allergy    Anxiety    Arthritis    CAD (coronary artery disease)    non-obstructive. cath 11/11   Diabetes mellitus    borderline diabetic - diet controlled    History of chicken pox    History of phobia    clastrophobia   Hyperlipidemia    Hypertension    Hypothyroidism    Kidney stones    hx of    Lichen sclerosus    Psoriasis    Stroke ([place])    Urinary tract infection    hx of    Past Surgical History:  Procedure Laterality Date   APPENDECTOMY     CARDIAC CATHETERIZATION     2011   CARPAL TUNNEL RELEASE     Left trigger finger   CATARACT EXTRACTION W/PHACO Left 06/06/2018   Procedure: CATARACT EXTRACTION PHACO AND INTRAOCULAR LENS PLACEMENT ([place]);  Surgeon: Birder Robson, MD;  Location: [facility] ORS;  Service: Ophthalmology;  Laterality: Left;  [place]  00:23 AP% 16.0 CDE 3.69 Fluid pack lot # [id number]   CATARACT EXTRACTION W/PHACO Right 09/19/2018   Procedure: CATARACT EXTRACTION PHACO AND INTRAOCULAR LENS PLACEMENT ([facility]);  Surgeon: Birder Robson, MD;  Location: [facility] ORS;  Service: Ophthalmology;  Laterality:  Right;  [place] 00:29.5 AP% 17.0 CDE$ 5.03 Fluid pack lot # [id number]   COLONOSCOPY WITH PROPOFOL N/A 05/13/2017   Procedure: COLONOSCOPY WITH PROPOFOL;  Surgeon: Manya Silvas, MD;  Location: [facility] ENDOSCOPY;  Service: Endoscopy;  Laterality: N/A;   ESOPHAGOGASTRODUODENOSCOPY (EGD) WITH PROPOFOL N/A 01/04/2020   Procedure: ESOPHAGOGASTRODUODENOSCOPY (EGD) WITH PROPOFOL;  Surgeon: [name], MD;  [facility] END[facility]COPY;  Service: Gastroenterology;  Laterality: N/A;   EYE SURGERY  2004   lasik - right eye    JOINT REPLACEMENT     KNEE SURGERY Right 2015   arthroscopy   TOTAL KNEE ARTHROPLASTY Bilateral 06/12/2015   Procedure: TOTAL KNEE BILATERAL;  Surgeon: Gaynelle Arabian, MD;  Location: [facility] ORS;  Service: Orthopedics;  Laterality: Bilateral;  and epidural   TUBAL LIGATION     Tubes tied     and untied. and tied again   Family History  Problem Relation Age of Onset   COPD Mother  Diabetes Mother    Hypertension Mother    Hypertension Father    Diabetes Sister    Hypertension Sister    Diabetes Brother    Arthritis Brother    Heart disease Brother    Arthritis Brother    Diabetes Brother    Heart disease Brother    Stomach cancer Maternal Grandfather    Stroke Maternal Grandfather    Breast cancer Daughter 68       Double mastectomy   Social History   Socioeconomic History   Marital status: Divorced    Spouse name: Not on file   Number of children: 6   Years of education: Not on file   Highest education level: Not on file  Occupational History   Occupation: Retired  Tobacco Use   Smoking status: Never   Smokeless tobacco: Never  Vaping Use   Vaping Use: Never used  Substance and Sexual Activity   Alcohol use: Yes    Alcohol/week: 0.0 standard drinks    Comment: occasionally   Drug use: No   Sexual activity: Not Currently    Birth control/protection: Post-menopausal  Other Topics Concern   Not on file  Social History Narrative   Lives alone   Right Handed    Drink 1 cups caffeine daily   Social Determinants of Health   Financial Resource Strain: Low Risk    Difficulty of Paying Living Expenses: Not very hard  Food Insecurity: No Food Insecurity   Worried About Charity fundraiser in the Last Year: Never true   Ran Out of Food in the Last Year: Never true  Transportation Needs: No Transportation Needs   Lack of Transportation (Medical): No   Lack of Transportation (Non-Medical): No  Physical Activity: Sufficiently Active   Days of Exercise per Week: 3 days   Minutes of Exercise per Session: 60 min  Stress: No Stress Concern Present   Feeling of Stress : Not at all  Social Connections: Socially Isolated   Frequency of Communication with Friends and Family: More than three times a week   Frequency of Social Gatherings with Friends and Family: Twice a week   Attends Religious Services: Never   Marine scientist or Organizations: No   Attends Music therapist: Never   Marital Status: Divorced    Tobacco Counseling Counseling given: Not Answered   Clinical Intake:  Pre-visit preparation completed: Yes  Pain : 0-10 Pain Score: 5  Pain Location: Shoulder Pain Orientation: Right     BMI - recorded: 24.88 Nutritional Status: BMI 25 -29 Overweight Nutritional Risks: None Diabetes: Yes CBG done?: No (visit completed over the phone) Did pt. bring in CBG monitor from home?: No  How often do you need to have someone help you when you read instructions, pamphlets, or other written materials from your doctor or pharmacy?: 1 - Never  Diabetes:  Is the patient diabetic?  Yes  If diabetic, was a CBG obtained today?  No , visit completed over the phone.  Did the patient bring in their glucometer from home?  No , visit completed over the phone. How often do you monitor your CBG's? 1 time daily.   Financial Strains and Diabetes Management:  Are you having any financial strains with the device, your supplies or your  medication? No .  Does the patient want to be seen by Chronic Care Management for management of their diabetes?  No  Would the patient like to be referred to a  Nutritionist or for Diabetic Management?  No   Diabetic Exams:  Diabetic Eye Exam: Completed 05/17/21. Overdue for diabetic eye exam. Pt has been advised about the importance in completing this exam. A referral has been placed today. Message sent to referral coordinator for scheduling purposes. Advised pt to expect a call from our office re: appt.  Diabetic Foot Exam: Due, patient has an upcoming appointment with the PCP.   Interpreter Needed?: No  Information entered by :: Janah Mcculloh LPN   Activities of Daily Living In your present state of health, do you have any difficulty performing the following activities: 11/30/2021  Hearing? N  Vision? N  Difficulty concentrating or making decisions? N  Walking or climbing stairs? N  Dressing or bathing? N  Doing errands, shopping? Y  Comment daughter assist with doctors appointment and heavy Airline pilot and eating ? N  Using the Toilet? N  In the past six months, have you accidently leaked urine? Y  Do you have problems with loss of bowel control? N  Managing your Medications? N  Managing your Finances? N  Housekeeping or managing your Housekeeping? N  Some recent data might be hidden    Patient Care Team: Jinny Sanders, MD as PCP - General (Family Medicine) Gaynelle Arabian, MD as Consulting Physician (Orthopedic Surgery) Minna Merritts, MD as Consulting Physician (Cardiology) Dasher, Rayvon Char, MD as Consulting Physician (Dermatology) Birder Robson, MD as Referring Physician (Ophthalmology) Debbora Dus, [facility] as Pharmacist (Pharmacist)  Indicate any recent Medical Services you may have received from other than [facility] providers in the past year (date may be approximate).     Assessment:   This is a routine wellness examination for  [place].  Hearing/Vision screen Hearing Screening - Comments:: Slight decrease in hearing from far away Vision Screening - Comments:: Last exam 05/20/21, Dr. Percell [place]  Dietary issues and exercise activities discussed: Current Exercise Habits: Home exercise routine, Type of exercise: walking, Time (Minutes): 60, Frequency (Times/Week): 3, Weekly Exercise (Minutes/Week): 180, Intensity: Moderate   Goals Addressed             This Visit's Progress    Patient Stated       Would like to continue to walk       Depression Screen PHQ 2/9 Scores 11/30/2021 05/29/2021 01/15/2021 10/07/2020 08/15/2020 07/08/2020 10/05/2019  PHQ - 2 Score 0 0 0 _0 0  PHQ- 9 Score - 1 0 _1 -    Fall Risk Fall Risk  11/30/2021 10/07/2020 10/05/2019 09/25/2018 09/20/2017  Falls in the past year? 0 1 0 No No  Number falls in past yr: 0 1 - - -  Injury with Fall? 0 0 - - -  Risk for fall due to : No Fall Risks Medication side effect - - -  Follow up Falls prevention discussed Falls evaluation completed;Falls prevention discussed - - -    FALL RISK PREVENTION PERTAINING TO THE HOME:  Any stairs in or around the home? Yes  If so, are there any without handrails? No  Home free of loose throw rugs in walkways, pet beds, electrical cords, etc? No  Adequate lighting in your home to reduce risk of falls? Yes   ASSISTIVE DEVICES UTILIZED TO PREVENT FALLS:  Life alert? No  Use of a cane, walker or w/c? No  Grab bars in the bathroom? Yes  Shower chair or bench in shower? Yes  Elevated toilet seat or a handicapped toilet? Yes  TIMED UP AND GO:  Was the test performed? No , visit completed over the phone.   Cognitive Function: Normal cognitive status assessed by this Nurse Health Advisor. No abnormalities found.   MMSE - Mini Mental State Exam 09/16/2021 10/07/2020 09/25/2018 09/20/2017 07/30/2016  Orientation to time _0 Orientation to Place _1 Registration _2 Attention/ Calculation 2  5 0 0 0  Recall _3 Language- name 2 objects 2 - 0 0 0  Language- repeat _4 Language- follow 3 step command 3 - _5 Language- read & follow direction 1 - 0 0 0  Write a sentence 1 - 0 0 0  Copy design 1 - 0 0 0  Total score 26 - _6 Immunizations Immunization History  Administered Date(s) Administered   H1N1 11/02/2008   Influenza Split 10/22/2006, 10/28/2007, 09/26/2008, 09/13/2009, 09/12/2010, 09/18/2011, 09/27/2012   Influenza, High Dose Seasonal PF 09/11/2016, 08/28/2019, 09/22/2020, 10/03/2021   Influenza,inj,Quad PF,6+ Mos 09/13/2013, 09/13/2014, 09/20/2017, 09/25/2018   Influenza-Unspecified 08/27/2014, 09/18/2015   PFIZER(Purple Top)SARS-COV-2 Vaccination 01/03/2020, 01/26/2020, 11/05/2020   Pneumococcal Conjugate-13 04/05/2014   Pneumococcal Polysaccharide-23 09/26/2008, 02/08/2017   Tdap 09/27/2012    TDAP status: Up to date  Flu Vaccine status: Up to date  Pneumococcal vaccine status: Up to date  Covid-19 vaccine status: Information provided on how to obtain vaccines.   Qualifies for Shingles Vaccine? Yes   Zostavax completed No   Shingrix Completed?: No.    Education has been provided regarding the importance of this vaccine. Patient has been advised to call insurance company to determine out of pocket expense if they have not yet received this vaccine. Advised may also receive vaccine at local pharmacy or Health Dept. Verbalized acceptance and understanding.  Screening Tests Health Maintenance  Topic Date Due   Zoster Vaccines- Shingrix (1 of 2) Never done   COVID-19 Vaccine (4 - Booster for Pfizer series) 12/31/2020   FOOT EXAM  09/29/2021   MAMMOGRAM  12/17/2021   OPHTHALMOLOGY EXAM  05/20/2022   HEMOGLOBIN A1C  05/28/2022   TETANUS/TDAP  09/27/2022   Pneumonia Vaccine 53+ Years old  Completed   INFLUENZA VACCINE  Completed   DEXA SCAN  Completed   Hepatitis C Screening  Completed   HPV VACCINES  Aged Out   COLONOSCOPY  (Pts 45-19yr Insurance coverage will need to be confirmed)  [place] Creek[place]aintenance Due  Topic Date Due   Zoster Vaccines- Shingrix (1 of 2) Never done   COVID-19 Vaccine (4 - Booster for PSteeleseries) 12/31/2020   FOOT EXAM  09/29/2021    Colorectal cancer screening: No longer required.   Mammogram status: Ordered patient has an appointment scheduled for 12/31/21. Pt provided with contact info and advised to call to schedule appt.   Bone Density status: Ordered 11/30/21. Pt provided with contact info and advised to call to schedule appt.  Lung Cancer Screening: (Low Dose CT Chest recommended if Age 77-80years, 30 pack-year currently smoking OR have quit w/in 15years.) does not qualify.     Additional Screening:  Hepatitis C Screening: does qualify; Completed 07/30/16  Vision Screening: Recommended annual ophthalmology exams for early detection of glaucoma and other disorders of the eye. Is the patient up to date with their annual eye exam?  Yes  Who  is the provider or what is the name of the office in which the patient attends annual eye exams? Dr. Percell [place]   Dental Screening: Recommended annual dental exams for proper oral hygiene  Community Resource Referral / Chronic Care Management: CRR required this visit?  No   CCM required this visit?  No      Plan:     I have personally reviewed and noted the following in the patient's chart:   Medical and social history Use of alcohol, tobacco or illicit drugs  Current medications and supplements including opioid prescriptions.  Functional ability and status Nutritional status Physical activity Advanced directives List of other physicians Hospitalizations, surgeries, and ER visits in previous 12 months Vitals Screenings to include cognitive, depression, and falls Referrals and appointments  In addition, I have reviewed and discussed with patient certain preventive protocols, quality  metrics, and best practice recommendations. A written personalized care plan for preventive services as well as general preventive health recommendations were provided to patient.   Due to this being a telephonic visit, the after visit summary with patients personalized plan was offered to patient via mail or my-chart. Patient would like to access on my-chart.   Loma Messing, LPN   [id number]   Nurse Health Advisor  Nurse Notes: none

## 2021-11-30 NOTE — Progress Notes (Signed)
No critical labs need to be addressed urgently. We will discuss labs in detail at upcoming office visit.   

## 2021-12-01 ENCOUNTER — Ambulatory Visit (INDEPENDENT_AMBULATORY_CARE_PROVIDER_SITE_OTHER): Payer: Medicare Other | Admitting: Family Medicine

## 2021-12-01 ENCOUNTER — Encounter: Payer: Self-pay | Admitting: Family Medicine

## 2021-12-01 ENCOUNTER — Other Ambulatory Visit: Payer: Self-pay

## 2021-12-01 VITALS — BP 148/70 | HR 96 | Temp 97.5°F | Ht 62.75 in | Wt 139.5 lb

## 2021-12-01 DIAGNOSIS — I10 Essential (primary) hypertension: Secondary | ICD-10-CM | POA: Diagnosis not present

## 2021-12-01 DIAGNOSIS — M25511 Pain in right shoulder: Secondary | ICD-10-CM | POA: Diagnosis not present

## 2021-12-01 DIAGNOSIS — I152 Hypertension secondary to endocrine disorders: Secondary | ICD-10-CM

## 2021-12-01 DIAGNOSIS — G8929 Other chronic pain: Secondary | ICD-10-CM

## 2021-12-01 DIAGNOSIS — E1169 Type 2 diabetes mellitus with other specified complication: Secondary | ICD-10-CM | POA: Diagnosis not present

## 2021-12-01 DIAGNOSIS — M25512 Pain in left shoulder: Secondary | ICD-10-CM | POA: Diagnosis not present

## 2021-12-01 DIAGNOSIS — I25118 Atherosclerotic heart disease of native coronary artery with other forms of angina pectoris: Secondary | ICD-10-CM | POA: Diagnosis not present

## 2021-12-01 DIAGNOSIS — E785 Hyperlipidemia, unspecified: Secondary | ICD-10-CM

## 2021-12-01 DIAGNOSIS — Z789 Other specified health status: Secondary | ICD-10-CM

## 2021-12-01 DIAGNOSIS — F331 Major depressive disorder, recurrent, moderate: Secondary | ICD-10-CM | POA: Diagnosis not present

## 2021-12-01 DIAGNOSIS — E039 Hypothyroidism, unspecified: Secondary | ICD-10-CM | POA: Diagnosis not present

## 2021-12-01 DIAGNOSIS — E1159 Type 2 diabetes mellitus with other circulatory complications: Secondary | ICD-10-CM

## 2021-12-01 MED ORDER — MELOXICAM 7.5 MG PO TABS: 7.5000 mg | ORAL_TABLET | Freq: Every day | ORAL | 0 refills | Status: DC

## 2021-12-01 NOTE — Progress Notes (Signed)
Patient ID: APRYL [name], female    DOB: 22-Jun-1946, 75 y.o.   MRN: [id number]  This visit was conducted in person.  BP (!) 148/70   Pulse 96   Temp (!) 97.5 F (36.4 C) (Temporal)   Ht 5' 2.75" (1.594 m)   Wt 139 lb 8 oz (63.3 kg)   SpO2 100%   BMI 24.91 kg/m    CC: Chief Complaint  Patient presents with   Annual Exam    Part 2    Subjective:   HPI: Renee [name] is a 75 y.o. female presenting on 12/01/2021 for Annual Exam (Part 2)  The patient presents for  review of chronic health problems. He/She also has the following acute concerns today: none  The patient saw a LPN or RN for medicare wellness visit.  Prevention and wellness was reviewed in detail. Note reviewed and important notes copied below.  Diabetes:   Good control on Trulicity  Lab Results  Component Value Date   HGBA1C 6.3 (H) 11/27/2021  Using medications without difficulties: Hypoglycemic episodes:nonw Hyperglycemic episodes: none Feet problems: Blood Sugars averaging: recent blood sugars have been elevated  since steroid injection in 10/21/2021'  FBS:  110-174 eye exam within last year:   Shoulder pain improved for 3 weeks but it is now back. Using tylenol alternated with ibuprofen.  Hypertension:   Slightly elevated in office today on losartan 50 mg daily BP Readings from Last 3 Encounters:  12/01/21 (!) 148/70  09/16/21 131/74  08/21/21 130/70  Using medication without problems or lightheadedness:  none Chest pain with exertion:none Edema:nonenone Average home BPs: elevated at home at times with pain. Other issues:    Elevated Cholesterol: Not re-eval'd with lab draw.  Lab Results  Component Value Date   CHOL 146 08/14/2021   HDL 45.80 08/14/2021   LDLCALC 84 08/14/2021   TRIG 81.0 08/14/2021   CHOLHDL 3 08/14/2021  Using medications without problems: Muscle aches:  Diet compliance: Exercise: Other complaints:  Hypothyroid  TSH, free t3 and free t4 in nml range   MDD, GAD,  Chronic insomnia:  PHQ 9: 4.. worse given the pain. Relevant past medical, surgical, family and social history reviewed and updated as indicated. Interim medical history since our last visit reviewed. Allergies and medications reviewed and updated. Outpatient Medications Prior to Visit  Medication Sig Dispense Refill   acetaminophen (TYLENOL) 650 MG CR tablet Take 650 mg by mouth every 8 (eight) hours as needed for pain.     aspirin EC 81 MG tablet Take by mouth daily.     Blood Glucose Monitoring Suppl (ONETOUCH VERIO) w/Device KIT by Does not apply route. Use to check fasting blood sugar daily and an occasional 2 hr postprandial     Carboxymethylcellulose Sodium (THERATEARS OP) Place 1 drop into both eyes at bedtime as needed.     clobetasol ointment (TEMOVATE) 0.05 % APPLY TOPICALLY DAILY AS NEEDED FOR PSORIASIS FLARES 60 g 0   co-enzyme Q-10 30 MG capsule Take 200 mg daily 1 capsule 1   Dulaglutide (TRULICITY) 4.09 WJ/1.9JY SOPN Inject into the skin once a week.     Emollient (CERAVE) CREA Apply 1 application topically daily as needed (psoriasis).     ezetimibe (ZETIA) 10 MG tablet Take 1 tablet (10 mg total) by mouth daily. 90 tablet 3   famotidine (PEPCID) 20 MG tablet Take 20 mg by mouth daily as needed for heartburn or indigestion.     Flaxseed, Linseed, (FLAX SEED  OIL PO) Take 1 capsule by mouth daily.     fluticasone (FLONASE) 50 MCG/ACT nasal spray Place 1 spray into both nostrils daily as needed for allergies.     glucose blood (ONETOUCH VERIO) test strip Use to check fasting blood sugar daily and an occasional 2 hr postprandial. 100 each 11   ibuprofen (ADVIL,MOTRIN) 200 MG tablet Take 600-800 mg by mouth every 6 (six) hours as needed for moderate pain.     Lancets (ONETOUCH ULTRASOFT) lancets Use to check fasting blood sugar daily and an occasional 2 hr postprandial 100 each 11   levothyroxine (SYNTHROID) 75 MCG tablet TAKE 1 TABLET BY MOUTH EVERY DAY 90 tablet 0   losartan  (COZAAR) 50 MG tablet TAKE 1 TABLET BY MOUTH EVERY DAY 90 tablet 1   MAGNESIUM PO Take by mouth daily.     Multiple Vitamin (MULTIVITAMIN) tablet Take 1 tablet by mouth every other day.      nitroGLYCERIN (NITROSTAT) 0.4 MG SL tablet Place 1 tablet (0.4 mg total) under the tongue every 5 (five) minutes as needed. 30 tablet 6   Skin Protectants, Misc. (EUCERIN) cream Apply 1 application topically See admin instructions. Eczema relief applies to arms and hands daily     triamcinolone cream (KENALOG) 0.1 % Apply 1 application topically 2 (two) times daily as needed (psoriasis).     Vitamin D, Cholecalciferol, 1000 units TABS Take 2,000 Units by mouth daily.     clobetasol ointment (TEMOVATE) 0.05 % Apply to affected area every night for 4 weeks, then every other day for 4 weeks and then twice a week for 4 weeks or until resolution. 60 g 5   No facility-administered medications prior to visit.     Per HPI unless specifically indicated in ROS section below Review of Systems  Constitutional:  Negative for fatigue and fever.  HENT:  Negative for congestion.   Eyes:  Negative for pain.  Respiratory:  Negative for cough and shortness of breath.   Cardiovascular:  Negative for chest pain, palpitations and leg swelling.  Gastrointestinal:  Negative for abdominal pain.  Genitourinary:  Negative for dysuria and vaginal bleeding.  Musculoskeletal:  Positive for arthralgias. Negative for back pain and joint swelling.  Neurological:  Negative for syncope, light-headedness and headaches.  Psychiatric/Behavioral:  Negative for dysphoric mood.   Objective:  BP (!) 148/70   Pulse 96   Temp (!) 97.5 F (36.4 C) (Temporal)   Ht 5' 2.75" (1.594 m)   Wt 139 lb 8 oz (63.3 kg)   SpO2 100%   BMI 24.91 kg/m   Wt Readings from Last 3 Encounters:  12/01/21 139 lb 8 oz (63.3 kg)  11/30/21 145 lb (65.8 kg)  09/16/21 145 lb (65.8 kg)      Physical Exam Vitals and nursing note reviewed.  Constitutional:       General: She is not in acute distress.    Appearance: Normal appearance. She is well-developed. She is not ill-appearing or toxic-appearing.  HENT:     Head: Normocephalic.     Right Ear: Hearing, tympanic membrane, ear canal and external ear normal.     Left Ear: Hearing, tympanic membrane, ear canal and external ear normal.     Nose: Nose normal.  Eyes:     General: Lids are normal. Lids are everted, no foreign bodies appreciated.     Conjunctiva/sclera: Conjunctivae normal.     Pupils: Pupils are equal, round, and reactive to light.  Neck:  Thyroid: No thyroid mass or thyromegaly.     Vascular: No carotid bruit.     Trachea: Trachea normal.  Cardiovascular:     Rate and Rhythm: Normal rate and regular rhythm.     Heart sounds: Normal heart sounds, S1 normal and S2 normal. No murmur heard.   No gallop.  Pulmonary:     Effort: Pulmonary effort is normal. No respiratory distress.     Breath sounds: Normal breath sounds. No wheezing, rhonchi or rales.  Abdominal:     General: Bowel sounds are normal. There is no distension or abdominal bruit.     Palpations: Abdomen is soft. There is no fluid wave or mass.     Tenderness: There is no abdominal tenderness. There is no guarding or rebound.     Hernia: No hernia is present.  Musculoskeletal:     Cervical back: Normal range of motion and neck supple.  Lymphadenopathy:     Cervical: No cervical adenopathy.  Skin:    General: Skin is warm and dry.     Findings: No rash.  Neurological:     Mental Status: She is alert.     Cranial Nerves: No cranial nerve deficit.     Sensory: No sensory deficit.  Psychiatric:        Mood and Affect: Mood is not anxious or depressed.        Speech: Speech normal.        Behavior: Behavior normal. Behavior is cooperative.        Judgment: Judgment normal.      Results for orders placed or performed in visit on 11/27/21  Hemoglobin A1c  Result Value Ref Range   Hgb A1c MFr Bld 6.3 (H) <5.7 %  of total Hgb   Mean Plasma Glucose 134 mg/dL   eAG (mmol/L) 7.4 mmol/L  TSH  Result Value Ref Range   TSH 2.67 0.40 - 4.50 mIU/L  T4, free  Result Value Ref Range   Free T4 1.3 0.8 - 1.8 ng/dL  T3, free  Result Value Ref Range   T3, Free 2.6 2.3 - 4.2 pg/mL    This visit occurred during the SARS-CoV-2 public health emergency.  Safety protocols were in place, including screening questions prior to the visit, additional usage of staff PPE, and extensive cleaning of exam room while observing appropriate contact time as indicated for disinfecting solutions.   COVID 19 screen:  No recent travel or known exposure to COVID19 The patient denies respiratory symptoms of COVID 19 at this time. The importance of social distancing was discussed today.   Assessment and Plan   The patient's preventative maintenance and recommended screening tests for an annual wellness exam were reviewed in full today. Brought up to date unless services declined.  Counselled on the importance of diet, exercise, and its role in overall health and mortality. The patient's FH and SH was reviewed, including their home life, tobacco status, and drug and alcohol status.   Vaccines:  Flu , PNA and COVID x 3 uptodate . Shingles - considering Foot exam - DONE  Colon cancer screening - colonoscopy 04/2017, plan repeat in 5 years per pt request.   Mammo: 11/2020,  Scheduled Bone density -  11/2017 mild osteopenia in spine, repeat due  Hep C screening - done  Nonsmoker   Problem List Items Addressed This Visit     Adult hypothyroidism    Stable, chronic.  Continue current medication.   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  Failed steroid injection in 09/27/2021.. will try trial of meloxicam.      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  Continue current medication.  Ttrulcity 0.75 mg weekly        Hyperlipidemia associated with type 2 diabetes mellitus ([place])    Almost at goal last OV on zetia.      Hypertension associated with diabetes ([place]) - Primary    Stable, chronic.  Continue current medication.    losartan 50 mg daily      MDD (major depressive disorder), recurrent episode, moderate (HCC)    Moderate control given current shoulder pain.      Statin intolerance     Eliezer Lofts, MD

## 2021-12-01 NOTE — Assessment & Plan Note (Signed)
Moderate control given current shoulder pain.

## 2021-12-01 NOTE — Assessment & Plan Note (Addendum)
Associated with HTN  Stable, chronic.  Continue current medication.  Ttrulcity 0.75 mg weekly

## 2021-12-01 NOTE — Patient Instructions (Addendum)
Trial of meloxicam low dose  daily for  shoulder pain.  Call if pain not controlled or follow up with [facility].  Restart glipizide 5 mg daily until blood sugars decrease back down.  Follow BP at home ... call if remaining up once pain is improved.  Please call the location of your choice from the menu below to schedule your Mammogram and/or Bone Density appointment.     [place] at [facility]   Phone:  [phone]   [place], [place] [address]                                            Services: 3D Mammogram and [place]  [place] at [facility])  Phone:  [phone]   [address] [place], [place] [address]                                              Services:  3D Mammogram and Bone Density

## 2021-12-01 NOTE — Assessment & Plan Note (Signed)
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  Failed steroid injection in 09/27/2021.. will try trial of meloxicam.

## 2021-12-01 NOTE — Assessment & Plan Note (Signed)
Stable, chronic.  Continue current medication.  losartan 50 mg daily   

## 2021-12-01 NOTE — Assessment & Plan Note (Signed)
Almost at goal last OV on zetia.

## 2021-12-01 NOTE — Assessment & Plan Note (Signed)
Stable, chronic.  Continue current medication.   Levothyroxine 75 mcg daily

## 2021-12-02 DIAGNOSIS — Z23 Encounter for immunization: Secondary | ICD-10-CM | POA: Diagnosis not present

## 2021-12-03 ENCOUNTER — Telehealth: Payer: Self-pay

## 2021-12-03 NOTE — Progress Notes (Signed)
    Chronic Care Management Pharmacy Assistant   Name: Renee [name]  MRN: [id number] DOB: 07/11/1946  Reason for Encounter: CCM (Appointment Reminder)   Medications: Outpatient Encounter Medications as of 12/03/2021  Medication Sig Note   acetaminophen (TYLENOL) 650 MG CR tablet Take 650 mg by mouth every 8 (eight) hours as needed for pain.    aspirin EC 81 MG tablet Take by mouth daily.    Blood Glucose Monitoring Suppl (ONETOUCH VERIO) w/Device KIT by Does not apply route. Use to check fasting blood sugar daily and an occasional 2 hr postprandial    Carboxymethylcellulose Sodium (THERATEARS OP) Place 1 drop into both eyes at bedtime as needed.    clobetasol ointment (TEMOVATE) 0.05 % APPLY TOPICALLY DAILY AS NEEDED FOR PSORIASIS FLARES    co-enzyme Q-10 30 MG capsule Take 200 mg daily    Dulaglutide (TRULICITY) 7.03 JK/0.9FG SOPN Inject into the skin once a week.    Emollient (CERAVE) CREA Apply 1 application topically daily as needed (psoriasis). 11/17/2016: Mix with triamcinolone   ezetimibe (ZETIA) 10 MG tablet Take 1 tablet (10 mg total) by mouth daily.    famotidine (PEPCID) 20 MG tablet Take 20 mg by mouth daily as needed for heartburn or indigestion.    Flaxseed, Linseed, (FLAX SEED OIL PO) Take 1 capsule by mouth daily.    fluticasone (FLONASE) 50 MCG/ACT nasal spray Place 1 spray into both nostrils daily as needed for allergies.    glucose blood (ONETOUCH VERIO) test strip Use to check fasting blood sugar daily and an occasional 2 hr postprandial.    ibuprofen (ADVIL,MOTRIN) 200 MG tablet Take 600-800 mg by mouth every 6 (six) hours as needed for moderate pain.    Lancets (ONETOUCH ULTRASOFT) lancets Use to check fasting blood sugar daily and an occasional 2 hr postprandial    levothyroxine (SYNTHROID) 75 MCG tablet TAKE 1 TABLET BY MOUTH EVERY DAY    losartan (COZAAR) 50 MG tablet TAKE 1 TABLET BY MOUTH EVERY DAY    MAGNESIUM PO Take by mouth daily.    meloxicam (MOBIC) 7.5 MG  tablet Take 1 tablet (7.5 mg total) by mouth daily.    Multiple Vitamin (MULTIVITAMIN) tablet Take 1 tablet by mouth every other day.     nitroGLYCERIN (NITROSTAT) 0.4 MG SL tablet Place 1 tablet (0.4 mg total) under the tongue every 5 (five) minutes as needed.    Skin Protectants, Misc. (EUCERIN) cream Apply 1 application topically See admin instructions. Eczema relief applies to arms and hands daily    triamcinolone cream (KENALOG) 0.1 % Apply 1 application topically 2 (two) times daily as needed (psoriasis).    Vitamin D, Cholecalciferol, 1000 units TABS Take 2,000 Units by mouth daily.    No facility-administered encounter medications on file as of 12/03/2021.   Left a message for Renee [name] to remind her of her upcoming telephone visit with Debbora Dus on 12/08/2021 at 1:00. Patient was reminded to have all medications, supplements and any blood glucose and blood pressure readings available for review at appointment.  Star Rating Drugs: Medication:  Last Fill: Day Supply Losartan 50 mg 18/29/9371 90 Trulicity 6.96 mg No fill date noted - could not speak with patient to verify   Debbora Dus, CPP notified  Marijean Niemann, [place] Assistant [phone]

## 2021-12-04 ENCOUNTER — Telehealth: Payer: Self-pay | Admitting: Family Medicine

## 2021-12-04 MED ORDER — TRIAMCINOLONE ACETONIDE 0.1 % EX CREA: 1.0000 "application " | TOPICAL_CREAM | Freq: Two times a day (BID) | CUTANEOUS | 0 refills | Status: AC | PRN

## 2021-12-04 NOTE — Telephone Encounter (Addendum)
Spoke with Ms. Crass and advise her to take her medication bottles to her pharmacy and they will change out lids for her that will be easier for her to open.  She is also requesting a new Rx for triamcinolone cream.  She states in the past the got a 454 gram jar which is easy for her to open and use but for some reason a tube was sent in and she has a hard time squeezing the tube with her trigger fingers.   She also needs the order her bone density to be faxed to [facility] in [place].  Order faxed as requested.   She wanted to let Dr. Diona Browner know that her FBS this morning was 121 mg/dl and her BP was 130/79 so it is doing much better.  She states the pill that Dr. Diona Browner gave her has helped.

## 2021-12-04 NOTE — Telephone Encounter (Signed)
Pt want to talk to cma about all her medications. Pt stated she can not squeeze or twist off her medication caps so she needs a different way

## 2021-12-08 ENCOUNTER — Other Ambulatory Visit: Payer: Self-pay

## 2021-12-08 ENCOUNTER — Ambulatory Visit: Payer: Medicare Other

## 2021-12-08 VITALS — BP 125/73

## 2021-12-08 DIAGNOSIS — Z789 Other specified health status: Secondary | ICD-10-CM

## 2021-12-08 DIAGNOSIS — E1159 Type 2 diabetes mellitus with other circulatory complications: Secondary | ICD-10-CM

## 2021-12-08 DIAGNOSIS — E1169 Type 2 diabetes mellitus with other specified complication: Secondary | ICD-10-CM

## 2021-12-08 NOTE — Progress Notes (Signed)
Chronic Care Management Pharmacy Note  12/08/21 Name:  Renee [name] MRN:  [id number] DOB:  05-25-1946  Subjective: Renee [name] is an 75 y.o. year old female who is a primary patient of Bedsole, Amy E, MD.  The CCM team was consulted for assistance with disease management and care coordination needs.    Engaged with patient by telephone for follow up visit in response to provider referral for pharmacy case management and/or care coordination services.   Consent to Services:  The patient was given information about Chronic Care Management services, agreed to services, and gave verbal consent prior to initiation of services.  Please see initial visit note for detailed documentation.   Patient Care Team: Jinny Sanders, MD as PCP - General (Family Medicine) Gaynelle Arabian, MD as Consulting Physician (Orthopedic Surgery) Minna Merritts, MD as Consulting Physician (Cardiology) Dasher, Rayvon Char, MD as Consulting Physician (Dermatology) Birder Robson, MD as Referring Physician (Ophthalmology) Debbora Dus, [facility] as Pharmacist (Pharmacist)  Recent office visits:  12/01/21 Orlin Hilding PCP - Pt present for pain. Trial of meloxicam low dose daily for shoulder pain. Call if pain not controlled or follow up with [facility].  Restart glipizide 5 mg daily until blood sugars decrease back down.  Follow BP at home ... call if remaining up once pain is improved. Please call the location of your choice from the menu below to schedule your Mammogram and/or Bone Density appointment.   08/21/21 - Dr.Bedsole PCP - LDL improved, near goal on Zetia; DM improved control on Trulicity, glipizide. Continue both for now given today receive a steroid injection for subacromial bursitis. If CBGs at baseline in 2 weeks.. can stop glipizide.   Recent consult visits:  09/16/21 - Neurology - Pt presented for TIA follow up. Continue aspirin for stroke prevention and maintain aggressive risk factor modification.  Add  co-Q10 200 mg daily for her myalgias.    Hospital visits:  ED - 06/17/21 - TIA    Objective:  Lab Results  Component Value Date   CREATININE 0.68 08/14/2021   BUN 21 08/14/2021   GFR 84.28 08/14/2021   GFRNONAA >60 06/17/2021   GFRAA >60 03/28/2018   NA 141 08/14/2021   K 4.1 08/14/2021   CALCIUM 9.6 08/14/2021   CO2 27 08/14/2021   GLUCOSE 115 (H) 08/14/2021    Lab Results  Component Value Date/Time   HGBA1C 6.3 (H) 11/27/2021 03:25 PM   HGBA1C 6.6 (H) 08/14/2021 11:07 AM   GFR 84.28 08/14/2021 11:07 AM   GFR 82.46 04/27/2021 10:41 AM    Last diabetic Eye exam:  Lab Results  Component Value Date/Time   HMDIABEYEEXA No Retinopathy 05/20/2021 12:00 AM    Last diabetic Foot exam:  Lab Results  Component Value Date/Time   HMDIABFOOTEX done 09/29/2020 12:00 AM     Lab Results  Component Value Date   CHOL 146 08/14/2021   HDL 45.80 08/14/2021   LDLCALC 84 08/14/2021   TRIG 81.0 08/14/2021   CHOLHDL 3 08/14/2021    Hepatic Function Latest Ref Rng & Units 08/14/2021 06/17/2021 04/27/2021  Total Protein 6.0 - 8.3 g/dL 7.2 7.5 7.5  Albumin 3.5 - 5.2 g/dL 4.2 4.2 4.2  AST 0 - 37 U/L _0 ALT 0 - 35 U/L _1 Alk Phosphatase 39 - 117 U/L 59 56 52  Total Bilirubin 0.2 - 1.2 mg/dL 0.4 0.6 0.4    Lab Results  Component Value Date/Time   TSH  2.67 11/27/2021 03:25 PM   TSH 2.85 10/07/2020 12:20 PM   FREET4 1.3 11/27/2021 03:25 PM   FREET4 0.85 10/07/2020 12:20 PM    CBC Latest Ref Rng & Units 06/17/2021 06/17/2021 03/28/2018  WBC 4.0 - 10.5 K/uL - 7.9 10.0  Hemoglobin 12.0 - 15.0 g/dL 15.6(H) 14.4 13.2  Hematocrit 36.0 - 46.0 % 46.0 45.2 40.6  Platelets 150 - 400 K/uL - 374 380    Lab Results  Component Value Date/Time   VD25OH 43.32 10/07/2020 12:20 PM   VD25OH 40.99 09/28/2019 08:54 AM    Clinical ASCVD: Yes  The 10-year ASCVD risk score (Arnett DK, et al., 2019) is: 41.5%   Values used to calculate the score:     Age: 70 years     Sex: Female      Is Non-Hispanic African American: No     Diabetic: Yes     Tobacco smoker: No     Systolic Blood Pressure: 659 mmHg     Is BP treated: Yes     HDL Cholesterol: 45.8 mg/dL     Total Cholesterol: 146 mg/dL    Depression screen [facility] 2/9 12/01/2021 11/30/2021 05/29/2021  Decreased Interest 0 0 0  Down, Depressed, Hopeless 2 0 0  PHQ - 2 Score 2 0 0  Altered sleeping 1 - 0  Tired, decreased energy 1 - 1  Change in appetite 0 - 0  Feeling bad or failure about yourself  0 - 0  Trouble concentrating 0 - 0  Moving slowly or fidgety/restless 0 - 0  Suicidal thoughts 0 - 0  PHQ-9 Score 4 - 1  Difficult doing work/chores Somewhat difficult - Not difficult at all  Some recent data might be hidden    Social History   Tobacco Use  Smoking Status Never  Smokeless Tobacco Never   BP Readings from Last 3 Encounters:  12/08/21 125/73  12/01/21 (!) 148/70  09/16/21 131/74   Pulse Readings from Last 3 Encounters:  12/01/21 96  09/16/21 84  08/21/21 84   Wt Readings from Last 3 Encounters:  12/01/21 139 lb 8 oz (63.3 kg)  11/30/21 145 lb (65.8 kg)  09/16/21 145 lb (65.8 kg)   BMI Readings from Last 3 Encounters:  12/01/21 24.91 kg/m  11/30/21 24.89 kg/m  09/16/21 24.89 kg/m    Assessment/Interventions: Review of patient past medical history, allergies, medications, health status, including review of consultants reports, laboratory and other test data, was performed as part of comprehensive evaluation and provision of chronic care management services.   SDOH:  (Social Determinants of Health) assessments and interventions performed: Yes SDOH Interventions    Flowsheet Row Most Recent Value  SDOH Interventions   Financial Strain Interventions Intervention Not Indicated        SDOH Screenings   Alcohol Screen: Low Risk    Last Alcohol Screening Score (AUDIT): 1  Depression (PHQ2-9): Low Risk    PHQ-2 Score: 4  Financial Resource Strain: Low Risk    Difficulty of Paying  Living Expenses: Not very hard  Food Insecurity: No Food Insecurity   Worried About Charity fundraiser in the Last Year: Never true   Ran Out of Food in the Last Year: Never true  Housing: Low Risk    Last Housing Risk Score: 0  Physical Activity: Sufficiently Active   Days of Exercise per Week: 3 days   Minutes of Exercise per Session: 60 min  Social Connections: Socially Isolated   Frequency of Communication  with Friends and Family: More than three times a week   Frequency of Social Gatherings with Friends and Family: Twice a week   Attends Religious Services: Never   Marine scientist or Organizations: No   Attends Music therapist: Never   Marital Status: Divorced  Stress: No Stress Concern Present   Feeling of Stress : Not at all  Tobacco Use: Low Risk    Smoking Tobacco Use: Never   Smokeless Tobacco Use: Never   Passive Exposure: Not on file  Transportation Needs: No Transportation Needs   Lack of Transportation (Medical): No   Lack of Transportation (Non-Medical): No    CCM Care Plan  Allergies  Allergen Reactions   Other Rash    Metal    Celexa [Citalopram] Other (See Comments)    Throat swelling and difficulty breathing   Codeine Nausea And Vomiting   Metformin Nausea And Vomiting   Nickel    Robaxin [Methocarbamol] Nausea Only   Crestor [Rosuvastatin] Rash   Simvastatin Swelling and Rash    Medications Reviewed Today     Reviewed by Debbora Dus, [facility] (Pharmacist) on 12/08/21 at [place] List Status: <None>   Medication Order Taking? Sig Documenting Provider Last Dose Status Informant  acetaminophen (TYLENOL) 500 MG tablet [id number] Yes Take 500 mg by mouth every 8 (eight) hours as needed. Taking BID [provider] Taking Active   aspirin EC 81 MG tablet [id number] Yes Take by mouth daily. [provider] Taking Active Multiple Informants  Blood Glucose Monitoring Suppl (ONETOUCH VERIO) w/Device KIT [id number] Yes by  Does not apply route. Use to check fasting blood sugar daily and an occasional 2 hr postprandial [provider] Taking Active   Carboxymethylcellulose Sodium (THERATEARS OP) [id number] Yes Place 1 drop into both eyes at bedtime as needed. [provider] Taking Active Multiple Informants  clobetasol ointment (TEMOVATE) 0.05 % [id number] Yes APPLY TOPICALLY DAILY AS NEEDED FOR PSORIASIS FLARES Bedsole, Amy E, MD Taking Active   co-enzyme Q-10 30 MG capsule [id number] Yes Take 200 mg daily Garvin Fila, MD Taking Active   Dulaglutide (TRULICITY) 6.46 OE/3.2ZY SOPN [id number] Yes Inject into the skin once a week. [provider] Taking Active   Emollient (CERAVE) CREA [id number] Yes Apply 1 application topically daily as needed (psoriasis). [provider] Taking Active Multiple Informants           Med Note Mabeline Caras   Wed Nov 17, 2016  8:12 AM) Mix with triamcinolone  ezetimibe (ZETIA) 10 MG tablet [id number] Yes Take 1 tablet (10 mg total) by mouth daily. Jinny Sanders, MD Taking Active Multiple Informants  famotidine (PEPCID) 20 MG tablet [id number] Yes Take 20 mg by mouth daily as needed for heartburn or indigestion. [provider] Taking Active Multiple Informants  Flaxseed, Linseed, (FLAX SEED OIL PO) [id number] Yes Take 1 capsule by mouth daily. [provider] Taking Active Multiple Informants  fluticasone (FLONASE) 50 MCG/ACT nasal spray [id number]  Place 1 spray into both nostrils daily as needed for allergies. [provider]  Active Multiple Informants  glucose blood (ONETOUCH VERIO) test strip [id number] Yes Use to check fasting blood sugar daily and an occasional 2 hr postprandial. Bedsole, Amy E, MD Taking Active   Lancets [facility] ULTRASOFT) lancets [id number] Yes Use to check fasting blood sugar daily and an occasional 2 hr postprandial Bedsole, Amy E, MD Taking Active   levothyroxine (SYNTHROID) 75 MCG tablet [id number]  Yes TAKE  1 TABLET BY MOUTH EVERY DAY Bedsole, Amy E, MD Taking Active   losartan (COZAAR) 50 MG tablet [id number] Yes TAKE 1 TABLET BY MOUTH EVERY DAY Jinny Sanders, MD Taking Active   MAGNESIUM PO [id number] Yes Take by mouth daily. [provider] Taking Active   meloxicam (MOBIC) 7.5 MG tablet [id number] Yes Take 1 tablet (7.5 mg total) by mouth daily. Jinny Sanders, MD Taking Active   Multiple Vitamin (MULTIVITAMIN) tablet [id number] Yes Take 1 tablet by mouth daily. [provider] Taking Active Multiple Informants  nitroGLYCERIN (NITROSTAT) 0.4 MG SL tablet [id number] Yes Place 1 tablet (0.4 mg total) under the tongue every 5 (five) minutes as needed. Minna Merritts, MD Taking Active   Skin Protectants, Misc. (EUCERIN) cream [id number] Yes Apply 1 application topically See admin instructions. Eczema relief applies to arms and hands daily [provider] Taking Active Multiple Informants  triamcinolone cream (KENALOG) 0.1 % [id number] Yes Apply 1 application topically 2 (two) times daily as needed (psoriasis). Jinny Sanders, MD Taking Active   Vitamin D, Cholecalciferol, 1000 units TABS [id number] Yes Take 2,000 Units by mouth daily. [provider] Taking Active Multiple Informants            Patient Active Problem List   Diagnosis Date Noted   Acute gout involving toe of right foot 08/12/2021   Chronic pain of both shoulders 08/12/2021   History of TIA (transient ischemic attack) 06/26/2021   Statin intolerance 01/15/2021   MDD (major depressive disorder), recurrent episode, moderate (HCC) 07/08/2020   Chronic insomnia 07/08/2020   Chronic neck pain 10/05/2019   Chronic diarrhea 03/30/2018   GAD (generalized anxiety disorder) 01/31/2018   Osteopenia 12/08/2017   Vitamin D deficiency 03/04/2016   Allergic rhinitis 03/04/2016   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)  ([place]) 10/20/2015   External hemorrhoid 10/20/2015    Psoriasis 10/20/2015   History of bilateral knee replacement 10/20/2015   Osteoarthritis of knee 11/25/2014   Hypertension associated with diabetes ([place]) 11/06/2010   CAD, NATIVE VESSEL 11/06/2010   Hyperlipidemia associated with type 2 diabetes mellitus ([place]) 01/25/2006   Adult hypothyroidism 11/15/2005    Immunization History  Administered Date(s) Administered   H1N1 11/02/2008   Influenza Split 10/22/2006, 10/28/2007, 09/26/2008, 09/13/2009, 09/12/2010, 09/18/2011, 09/27/2012   Influenza, High Dose Seasonal PF 09/11/2016, 08/28/2019, 09/22/2020, 10/03/2021   Influenza,inj,Quad PF,6+ Mos 09/13/2013, 09/13/2014, 09/20/2017, 09/25/2018   Influenza-Unspecified 08/27/2014, 09/18/2015   PFIZER(Purple Top)SARS-COV-2 Vaccination 01/03/2020, 01/26/2020, 11/05/2020   Pneumococcal Conjugate-13 04/05/2014   Pneumococcal Polysaccharide-23 09/26/2008, 02/08/2017   Tdap 09/27/2012    Conditions to be addressed/monitored:  Hypertension, Hyperlipidemia and Diabetes  Care Plan : [place]  Updates made by Debbora Dus, [place] since 12/11/2021 12:00 AM     Problem: CHL AMB "PATIENT-SPECIFIC PROBLEM"      Long-Range Goal: Disease Management   Start Date: 05/11/2021  This Visit's Progress: On track  Priority: High  Note:   Current Barriers:  None identified   Pharmacist Clinical Goal(s):  Patient will achieve ability to self administer medications as prescribed through use of pillbox as evidenced by patient report through collaboration with PharmD and provider.   Interventions: 1:1 collaboration with Jinny Sanders, MD regarding development and update of comprehensive plan of care as evidenced by provider attestation and co-signature Inter-disciplinary care team collaboration (see longitudinal plan of care) Comprehensive medication review performed; medication list updated in electronic medical record  Hypertension (BP goal <140/90) -Controlled,  per home BP readings   -Current treatment: Losartan 50 mg - 1 tablet daily  -Medications previously tried: none  -Current home readings: using arm cuff, she purchased a new one this year -12/6 - 140/85 -12/8 - 143/88  -12/9 - 130/79  -12/10 - 154/84  -12/11 - 127/75 -12/12 - 131/81 -12/13 - 125/73 -Counseled to monitor BP at home weekly or when symptomatic, with an arm cuff, document, and call if above 140/90 -Recommended to continue current medication   Hyperlipidemia: (LDL goal < 70) -Adequate control - LDL 84  -Current treatment: Ezetimibe 10 mg - 1 tablet daily  Aspirin 81 mg - 1 tablet every day Flaxseed Oil (Omega 3-6-9) - 1 tablet daily -Medications previously tried: Gemfibrozil - myopathy, statin intolerance -Recommended to continue current medication  Diabetes (A1c goal <7%) -Controlled, A1c 6.3%, she has noticed improvement in BG as her pain has improved. -She denies any recent courses of prednisone or injections. She is having a lot less lows off glipizide. No longer feeling weak. She has really enjoyed the Trulicity. -Current medications: Trulicity 0.25 mg - Inject once weekly  -Medications previously tried:  glipizide - stopped when she started Trulicity -Current home glucose readings - checking daily fasting BG this month  -12/6 - 184 -12/8 - 162  -12/9 - 121  -12/10 - 138  -12/11 - 180 -12/12 - 108 -12/13 - 122  -Denies hypoglycemic/hyperglycemia.  -Diet - continues to limit breads and sweets, tries to eat vegetables often -Eye and foot exam are up to date -She receives Trulicity through PAP. States she has 5 months supply on hand. Will need renewal before May 2022. -Recommended to continue current medication  Arthritis/Shoulder Pain Meloxicam - once daily (7 PM) Tylenol 500 mg - takes 2 tablet (1000 mg) BID Voltaren gel - up to four time days PRN (uses up to once daily)  Patient Goals/Self-Care Activities Patient will:  - check blood glucose daily and keep log  - check  blood pressure weekly and keep log   Follow Up Plan:  CCM team call - 3 months (renew Trulicity PAP)  CCM pharmacist - 6 months   Medication Assistance: Patient receives Trulicity 4.86 mg once weekly through patient assistance program. Enrollment ends 12/26/21.     Patient's preferred pharmacy is: [id number] [place] [id number]: [phone]: [phone] Uses pill box? Yes - fills one week at a time ([facility] endorses 100% compliance  We discussed: Current pharmacy is preferred with insurance plan and patient is satisfied with pharmacy services. [facility] with [facility] Value Script.   Care Plan and Follow Up Patient Decision:  Patient agrees to Care Plan and Follow-up.  MDebbora Dus PharmD Clinical Pharmacist [phone]

## 2021-12-08 NOTE — Patient Instructions (Addendum)
Dear Renee [name],  Below is a summary of the goals we discussed during our follow up appointment on December 08, 2021. Please contact me anytime with questions or concerns.   Visit Information  Patient Care Plan: CCM Pharmacy Care Plan     Problem Identified: CHL AMB "PATIENT-SPECIFIC PROBLEM"      Long-Range Goal: Disease Management   Start Date: 05/11/2021  This Visit's Progress: On track  Priority: High  Note:   Current Barriers:  None identified   Pharmacist Clinical Goal(s):  Patient will achieve ability to self administer medications as prescribed through use of pillbox as evidenced by patient report through collaboration with PharmD and provider.   Interventions: 1:1 collaboration with Jinny Sanders, MD regarding development and update of comprehensive plan of care as evidenced by provider attestation and co-signature Inter-disciplinary care team collaboration (see longitudinal plan of care) Comprehensive medication review performed; medication list updated in electronic medical record  Hypertension (BP goal <140/90) -Controlled, per home BP readings  -Current treatment: Losartan 50 mg - 1 tablet daily  -Medications previously tried: none  -Current home readings: using arm cuff, she purchased a new one this year -12/6 - 140/85 -12/8 - 143/88  -12/9 - 130/79  -12/10 - 154/84  -12/11 - 127/75 -12/12 - 131/81 -12/13 - 125/73 -Counseled to monitor BP at home weekly or when symptomatic, with an arm cuff, document, and call if above 140/90 -Recommended to continue current medication   Hyperlipidemia: (LDL goal < 70) -Adequate control - LDL 84  -Current treatment: Ezetimibe 10 mg - 1 tablet daily  Aspirin 81 mg - 1 tablet every day Flaxseed Oil (Omega 3-6-9) - 1 tablet daily -Medications previously tried: Gemfibrozil - myopathy, statin intolerance -Recommended to continue current medication  Diabetes (A1c goal <7%) -Controlled, A1c 6.3%, she has noticed  improvement in BG as her pain has improved. -She denies any recent courses of prednisone or injections. She is having a lot less lows off glipizide. No longer feeling weak. She has really enjoyed the Trulicity. -Current medications: Trulicity 6.37 mg - Inject once weekly  -Medications previously tried:  glipizide - stopped when she started Trulicity -Current home glucose readings - checking daily fasting BG this month  -12/6 - 184 -12/8 - 162  -12/9 - 121  -12/10 - 138  -12/11 - 180 -12/12 - 108 -12/13 - 122  -Denies hypoglycemic/hyperglycemia.  -Diet - continues to limit breads and sweets, tries to eat vegetables often -Eye and foot exam are up to date -She receives Trulicity through PAP. States she has 5 months supply on hand. Will need renewal before May 2022. -Recommended to continue current medication  Arthritis/Shoulder Pain Meloxicam - once daily (7 PM) Tylenol 500 mg - takes 2 tablet (1000 mg) BID Voltaren gel - up to four time days PRN (uses up to once daily)  Patient Goals/Self-Care Activities Patient will:  - check blood glucose daily and keep log  - check blood pressure weekly and keep log   Follow Up Plan:  CCM team call - 3 months (renew Trulicity PAP)  CCM pharmacist - 6 months   Medication Assistance: Patient receives Trulicity 8.58 mg once weekly through patient assistance program. Enrollment ends 12/26/21.      Patient verbalizes understanding of instructions provided today and agrees to view in [place].   Debbora Dus, PharmD Clinical Pharmacist Practitioner [facility] at [facility] [phone]

## 2021-12-09 NOTE — Progress Notes (Signed)
Cardiology Office Note  Date:  12/11/2021   ID:  Renee [name] 1946-09-25, MRN [id number]  PCP:  Renee [name], Renee [name]   Chief Complaint  Patient presents with   Other    12 Month f/u no complaints today. Meds reviewed verbally with pt.    HPI:  Renee [name] is a very pleasant 75 year old woman with a history of  hypertension,  hyperlipidemia,  long history of diabetes.  Cardiac catheterization October 2011 for chest pain showed mild to moderate non-obstructive CAD. 2010  With  EF of 55% to 60%.   40-50% LAD disease, 60% ostial diagonal disease, 40% PDA History of 2 knee replacements  in [place] Medication intolerances She presents today for follow-up of her coronary artery disease  Daughter on the phone for today's visit, works at [employer] seen in clinic December 2021 In the hospital 05/2021: Possible stroke MRI: no CVA Neck CT: mild carotid disease Etiology unclear  Previously took her self off gemfibrozil and Zetia, was having muscle aches Cholesterol jumped up greater than 200  put her self back on Zetia, no side effects  14 pound weight loss compared to last year On Trulicity Lab work reviewed  Total chol 146 A1C 6.3  Muscle hurting in arms, had cortisone (lasted 3 weeks), completed PT Has seen ortho and rheumatology On meloxicam  No regular exercise program  Elevated blood pressure today Blood pressure elevated on prior clinic visit Reports it is better controlled at home but did not bring any numbers with her Reports sometimes in the 1 teens to 130s May have whitecoat  Statin intolerance  EKG personally reviewed by myself on todays visit Shows normal sinus rhythm with rate 77 bpm no significant ST or T wave changes No change   Previous significant reaction to statins, simvastatin may have caused a rash.     PMH:   has a past medical history of Allergy, Anxiety, Arthritis, CAD (coronary artery disease), Diabetes mellitus, History of chicken pox,  History of phobia, Hyperlipidemia, Hypertension, Hypothyroidism, Kidney stones, Lichen sclerosus, Psoriasis, Stroke ([place]), and Urinary tract infection.  PSH:    Past Surgical History:  Procedure Laterality Date   APPENDECTOMY     CARDIAC CATHETERIZATION     2011   CARPAL TUNNEL RELEASE     Left trigger finger   CATARACT EXTRACTION W/PHACO Left 06/06/2018   Procedure: CATARACT EXTRACTION PHACO AND INTRAOCULAR LENS PLACEMENT ([place]);  Surgeon: Birder Robson, Renee [name];  Location: [facility] ORS;  Service: Ophthalmology;  Laterality: Left;  [place]  00:23 AP% 16.0 CDE 3.69 Fluid pack lot # [id number]   CATARACT EXTRACTION W/PHACO Right 09/19/2018   Procedure: CATARACT EXTRACTION PHACO AND INTRAOCULAR LENS PLACEMENT ([facility]);  Surgeon: Birder Robson, Renee [name];  Location: [facility] ORS;  Service: Ophthalmology;  Laterality: Right;  [place] 00:29.5 AP% 17.0 CDE$ 5.03 Fluid pack lot # [id number]   COLONOSCOPY WITH PROPOFOL N/A 05/13/2017   Procedure: COLONOSCOPY WITH PROPOFOL;  Surgeon: Manya Silvas, Renee [name];  [facility];  Service: Endoscopy;  Laterality: N/A;   ESOPHAGOGASTRODUODENOSCOPY (EGD) WITH PROPOFOL N/A 01/04/2020   Procedure: ESOPHAGOGASTRODUODENOSCOPY (EGD) WITH PROPOFOL;  Surgeon: [name], Renee [name];  Location: [facility] ENDOSCOPY;  Service: Gastroenterology;  Laterality: N/A;   EYE SURGERY  2004   lasik - right eye    JOINT REPLACEMENT     KNEE SURGERY Right 2015   arthroscopy   TOTAL KNEE ARTHROPLASTY Bilateral 06/12/2015   Procedure: TOTAL KNEE BILATERAL;  Surgeon: Gaynelle Arabian, Renee [name];  Location: [facility] ORS;  Service:  Orthopedics;  Laterality: Bilateral;  and epidural   TUBAL LIGATION     Tubes tied     and untied. and tied again    Current Outpatient Medications  Medication Sig Dispense Refill   acetaminophen (TYLENOL) 500 MG tablet Take 500 mg by mouth every 8 (eight) hours as needed. Taking BID     aspirin EC 81 MG tablet Take by mouth daily.     Blood Glucose Monitoring Suppl (ONETOUCH VERIO) w/Device KIT  by Does not apply route. Use to check fasting blood sugar daily and an occasional 2 hr postprandial     Carboxymethylcellulose Sodium (THERATEARS OP) Place 1 drop into both eyes at bedtime as needed.     clobetasol ointment (TEMOVATE) 0.05 % APPLY TOPICALLY DAILY AS NEEDED FOR PSORIASIS FLARES 60 g 0   co-enzyme Q-10 30 MG capsule Take 200 mg daily 1 capsule 1   Dulaglutide (TRULICITY) 2.77 OE/4.2PN SOPN Inject into the skin once a week.     Emollient (CERAVE) CREA Apply 1 application topically daily as needed (psoriasis).     ezetimibe (ZETIA) 10 MG tablet Take 1 tablet (10 mg total) by mouth daily. 90 tablet 3   famotidine (PEPCID) 20 MG tablet Take 20 mg by mouth daily as needed for heartburn or indigestion.     Flaxseed, Linseed, (FLAX SEED OIL PO) Take 1 capsule by mouth daily.     fluticasone (FLONASE) 50 MCG/ACT nasal spray Place 1 spray into both nostrils daily as needed for allergies.     glucose blood (ONETOUCH VERIO) test strip Use to check fasting blood sugar daily and an occasional 2 hr postprandial. 100 each 11   Lancets (ONETOUCH ULTRASOFT) lancets Use to check fasting blood sugar daily and an occasional 2 hr postprandial 100 each 11   levothyroxine (SYNTHROID) 75 MCG tablet TAKE 1 TABLET BY MOUTH EVERY DAY 90 tablet 0   losartan (COZAAR) 50 MG tablet TAKE 1 TABLET BY MOUTH EVERY DAY 90 tablet 1   MAGNESIUM PO Take by mouth daily.     meloxicam (MOBIC) 7.5 MG tablet Take 1 tablet (7.5 mg total) by mouth daily. 30 tablet 0   Multiple Vitamin (MULTIVITAMIN) tablet Take 1 tablet by mouth daily.     Skin Protectants, Misc. (EUCERIN) cream Apply 1 application topically See admin instructions. Eczema relief applies to arms and hands daily     triamcinolone cream (KENALOG) 0.1 % Apply 1 application topically 2 (two) times daily as needed (psoriasis). 454 g 0   Vitamin D, Cholecalciferol, 1000 units TABS Take 2,000 Units by mouth daily.     nitroGLYCERIN (NITROSTAT) 0.4 MG SL tablet Place  1 tablet (0.4 mg total) under the tongue every 5 (five) minutes as needed. 25 tablet 1   No current facility-administered medications for this visit.    Allergies:   Other, Celexa [citalopram], Codeine, Metformin, Nickel, Robaxin [methocarbamol], Crestor [rosuvastatin], and Simvastatin   Social History:  The patient  reports that she has never smoked. She has never used smokeless tobacco. She reports current alcohol use. She reports that she does not use drugs.   Family History:   family history includes Arthritis in her brother and brother; Breast cancer (age of onset: 16) in her daughter; COPD in her mother; Diabetes in her brother, brother, mother, and sister; Heart disease in her brother and brother; Hypertension in her father, mother, and sister; Stomach cancer in her maternal grandfather; Stroke in her maternal grandfather.   Review of Systems: Review of  Systems  Constitutional: Negative.   Respiratory: Negative.    Cardiovascular: Negative.   Gastrointestinal: Negative.   Musculoskeletal: Negative.   Neurological: Negative.   Psychiatric/Behavioral: Negative.    All other systems reviewed and are negative.  PHYSICAL EXAM: VS:  BP (!) 145/85    Pulse 77    Ht $R'5\' 3"'lk$  (1.6 m)    Wt 140 lb 4 oz (63.6 kg)    SpO2 96%    BMI 24.84 kg/m  , BMI Body mass index is 24.84 kg/m. Constitutional:  oriented to person, place, and time. No distress.  HENT:  Head: Grossly normal Eyes:  no discharge. No scleral icterus.  Neck: No JVD, no carotid bruits  Cardiovascular: Regular rate and rhythm, no murmurs appreciated Pulmonary/Chest: Clear to auscultation bilaterally, no wheezes or rails Abdominal: Soft.  no distension.  no tenderness.  Musculoskeletal: Normal range of motion Neurological:  normal muscle tone. Coordination normal. No atrophy Skin: Skin warm and dry Psychiatric: normal affect, pleasant  Recent Labs: 06/17/2021: Hemoglobin 15.6; Platelets 374 08/14/2021: ALT 19; BUN 21;  Creatinine, Ser 0.68; Potassium 4.1; Sodium 141 11/27/2021: TSH 2.67   Lipid Panel Lab Results  Component Value Date   CHOL 146 08/14/2021   HDL 45.80 08/14/2021   LDLCALC 84 08/14/2021   TRIG 81.0 08/14/2021      Wt Readings from Last 3 Encounters:  12/11/21 140 lb 4 oz (63.6 kg)  12/01/21 139 lb 8 oz (63.3 kg)  11/30/21 145 lb (65.8 kg)     ASSESSMENT AND PLAN:  Essential hypertension -  Long discussion with patient and daughter on phone, recommend she monitor blood pressure closely at home call us with numbers My repeat blood pressure check today was improved but still not at goal  Atherosclerosis of native coronary artery of native heart without angina pectoris - Plan: EKG 12-Lead Currently with no symptoms of angina. No further workup at this time. Continue current medication regimen. We discussed PCSK9 inhibitor  Hypercholesteremia Statin intolerance, Stopped gemfibrozil/Zetia, possible muscle side effect Back on Zetia with no issues Numbers improved LDL not quite at goal, discussed PCSK9 inhibitor, for now we will continue to monitor as weight continues to drop  Type 2 diabetes mellitus with complication, unspecified long term insulin use status (HCC) Drop in weight 14 pounds, A1c much improved low 6 range    Total encounter time more than 35 minutes  Greater than 50% was spent in counseling and coordination of care with the patient   Orders Placed This Encounter  Procedures   EKG 12-Lead      Signed, Esmond Plants, M.D., Ph.D. 12/11/2021  [place], [place]

## 2021-12-11 ENCOUNTER — Ambulatory Visit (INDEPENDENT_AMBULATORY_CARE_PROVIDER_SITE_OTHER): Payer: Medicare Other | Admitting: Cardiovascular Disease

## 2021-12-11 ENCOUNTER — Encounter: Payer: Self-pay | Admitting: Cardiovascular Disease

## 2021-12-11 ENCOUNTER — Other Ambulatory Visit: Payer: Self-pay

## 2021-12-11 VITALS — BP 145/85 | HR 77 | Ht 63.0 in | Wt 140.2 lb

## 2021-12-11 DIAGNOSIS — I10 Essential (primary) hypertension: Secondary | ICD-10-CM

## 2021-12-11 DIAGNOSIS — T466X5A Adverse effect of antihyperlipidemic and antiarteriosclerotic drugs, initial encounter: Secondary | ICD-10-CM

## 2021-12-11 DIAGNOSIS — I25118 Atherosclerotic heart disease of native coronary artery with other forms of angina pectoris: Secondary | ICD-10-CM

## 2021-12-11 DIAGNOSIS — E7800 Pure hypercholesterolemia, unspecified: Secondary | ICD-10-CM

## 2021-12-11 DIAGNOSIS — E119 Type 2 diabetes mellitus without complications: Secondary | ICD-10-CM

## 2021-12-11 DIAGNOSIS — M791 Myalgia, unspecified site: Secondary | ICD-10-CM

## 2021-12-11 MED ORDER — NITROGLYCERIN 0.4 MG SL SUBL: 0.4000 mg | SUBLINGUAL_TABLET | SUBLINGUAL | 1 refills | Status: DC | PRN

## 2021-12-11 NOTE — Patient Instructions (Addendum)
Medication Instructions:  No changes  If you need a refill on your cardiac medications before your next appointment, please call your pharmacy.   Lab work: No new labs needed  Testing/Procedures: No new testing needed  Follow-Up: At [facility], you and your health needs are our priority.  As part of our continuing mission to provide you with exceptional heart care, we have created designated Provider Care Teams.  These Care Teams include your primary Cardiologist (physician) and Advanced Practice Providers (APPs -  Physician Assistants and Nurse Practitioners) who all work together to provide you with the care you need, when you need it.  You will need a follow up appointment in 12 months  Providers on your designated Care Team:   Christopher Berge, NP Ryan Dunn, PA-C Cadence Furth, PA-C  COVID-19 Vaccine Information can be found at: [email or web address] For questions related to vaccine distribution or appointments, please email [email or web address] or call [phone].   

## 2021-12-23 ENCOUNTER — Ambulatory Visit: Payer: Medicare Other | Admitting: Obstetrics and Gynecology

## 2021-12-28 ENCOUNTER — Other Ambulatory Visit: Payer: Self-pay | Admitting: Family Medicine

## 2021-12-29 NOTE — Telephone Encounter (Signed)
Last office visit 12/01/2021 for CPE.  Last refilled 12/01/2021 for #30 with no refills.  Next Appt: 06/04/2022 for 6 month follow up.

## 2021-12-31 DIAGNOSIS — Z1231 Encounter for screening mammogram for malignant neoplasm of breast: Secondary | ICD-10-CM | POA: Diagnosis not present

## 2021-12-31 DIAGNOSIS — Z780 Asymptomatic menopausal state: Secondary | ICD-10-CM | POA: Diagnosis not present

## 2021-12-31 LAB — HM MAMMOGRAPHY

## 2021-12-31 LAB — HM DEXA SCAN: HM Dexa Scan: NORMAL

## 2022-01-01 ENCOUNTER — Encounter: Payer: Self-pay | Admitting: Family Medicine

## 2022-01-07 ENCOUNTER — Other Ambulatory Visit: Payer: Self-pay | Admitting: Family Medicine

## 2022-01-11 ENCOUNTER — Ambulatory Visit (INDEPENDENT_AMBULATORY_CARE_PROVIDER_SITE_OTHER): Payer: Medicare Other | Admitting: Obstetrics and Gynecology

## 2022-01-11 ENCOUNTER — Encounter: Payer: Self-pay | Admitting: Obstetrics and Gynecology

## 2022-01-11 ENCOUNTER — Other Ambulatory Visit: Payer: Self-pay

## 2022-01-11 VITALS — BP 120/74 | Ht 63.0 in | Wt 142.6 lb

## 2022-01-11 DIAGNOSIS — L900 Lichen sclerosus et atrophicus: Secondary | ICD-10-CM | POA: Diagnosis not present

## 2022-01-11 NOTE — Progress Notes (Signed)
Patient ID: Renee [name], female   DOB: November 08, 1946, 76 y.o.   MRN: [id number]  Reason for Consult: Follow-up   Referred by Jinny Sanders, MD  Subjective:     HPI:  Renee [name] is a 76 y.o. female. She is following up today regarding lichen sclerosis. She reports that her bottom has fluctuated in terms of pain.  She reports that she has similar flares on her hands from psoriasis.  She reports that when the psoriasis on her hands acts up she notices more problems on her bottom as well.  She has been using the steroid ointment 1-2 times a week.    She notes she has also been having issues with constipation and hemorrhoids.  Gynecological History  No LMP recorded. Patient is postmenopausal.  Past Medical History:  Diagnosis Date   Allergy    Anxiety    Arthritis    CAD (coronary artery disease)    non-obstructive. cath 11/11   Diabetes mellitus    borderline diabetic - diet controlled    History of chicken pox    History of phobia    clastrophobia   Hyperlipidemia    Hypertension    Hypothyroidism    Kidney stones    hx of    Lichen sclerosus    Psoriasis    Stroke ([place])    Urinary tract infection    hx of    Family History  Problem Relation Age of Onset   COPD Mother    Diabetes Mother    Hypertension Mother    Hypertension Father    Diabetes Sister    Hypertension Sister    Diabetes Brother    Arthritis Brother    Heart disease Brother    Arthritis Brother    Diabetes Brother    Heart disease Brother    Stomach cancer Maternal Grandfather    Stroke Maternal Grandfather    Breast cancer Daughter 81       Double mastectomy   Past Surgical History:  Procedure Laterality Date   APPENDECTOMY     CARDIAC CATHETERIZATION     2011   CARPAL TUNNEL RELEASE     Left trigger finger   CATARACT EXTRACTION W/PHACO Left 06/06/2018   Procedure: CATARACT EXTRACTION PHACO AND INTRAOCULAR LENS PLACEMENT ([place]);  Surgeon: Birder Robson, MD;  Location: [facility] ORS;   Service: Ophthalmology;  Laterality: Left;  [place]  00:23 AP% 16.0 CDE 3.69 Fluid pack lot # [id number]   CATARACT EXTRACTION W/PHACO Right 09/19/2018   Procedure: CATARACT EXTRACTION PHACO AND INTRAOCULAR LENS PLACEMENT ([facility]);  Surgeon: Birder Robson, MD;  Location: [facility] ORS;  Service: Ophthalmology;  Laterality: Right;  [place] 00:29.5 AP% 17.0 CDE$ 5.03 Fluid pack lot # [id number] H   COLONOSCOPY WITH PROPOFOL N/A 05/13/2017   Procedure: COLONOSCOPY WITH PROPOFOL;  Surgeon: Manya Silvas, MD;  Location: [facility] ENDOSCOPY;  Service: Endoscopy;  Laterality: N/A;   ESOPHAGOGASTRODUODENOSCOPY (EGD) WITH PROPOFOL N/A 01/04/2020   Procedure: ESOPHAGOGASTRODUODENOSCOPY (EGD) WITH PROPOFOL;  Surgeon: [name], MD;  Location[facility] ENDOSCOPY;  Service: Gastroenterology;  Laterality: N/A;   EYE SURGERY  2004   lasik - right eye    JOINT REPLACEMENT     KNEE SURGERY Right 2015   arthroscopy   TOTAL KNEE ARTHROPLASTY Bilateral 06/12/2015   Procedure: TOTAL KNEE BILATERAL;  Surgeon: Gaynelle Arabian, MD;  Location: [facility] ORS;  Service: Orthopedics;  Laterality: Bilateral;  and epidural   TUBAL LIGATION     Tubes tied  and untied. and tied again    Short Social History:  Social History   Tobacco Use   Smoking status: Never   Smokeless tobacco: Never  Substance Use Topics   Alcohol use: Yes    Alcohol/week: 0.0 standard drinks    Comment: occasionally    Allergies  Allergen Reactions   Other Rash    Metal    Celexa [Citalopram] Other (See Comments)    Throat swelling and difficulty breathing   Codeine Nausea And Vomiting   Metformin Nausea And Vomiting   Nickel    Robaxin [Methocarbamol] Nausea Only   Crestor [Rosuvastatin] Rash   Simvastatin Swelling and Rash    Current Outpatient Medications  Medication Sig Dispense Refill   acetaminophen (TYLENOL) 500 MG tablet Take 500 mg by mouth every 8 (eight) hours as needed. Taking BID     aspirin EC 81 MG tablet Take by mouth daily.     Blood  Glucose Monitoring Suppl (ONETOUCH VERIO) w/Device KIT by Does not apply route. Use to check fasting blood sugar daily and an occasional 2 hr postprandial     Carboxymethylcellulose Sodium (THERATEARS OP) Place 1 drop into both eyes at bedtime as needed.     clobetasol ointment (TEMOVATE) 0.05 % APPLY TOPICALLY DAILY AS NEEDED FOR PSORIASIS FLARES 60 g 0   co-enzyme Q-10 30 MG capsule Take 200 mg daily 1 capsule 1   Dulaglutide (TRULICITY) 2.75 TZ/0.0FV SOPN Inject into the skin once a week.     Emollient (CERAVE) CREA Apply 1 application topically daily as needed (psoriasis).     ezetimibe (ZETIA) 10 MG tablet Take 1 tablet (10 mg total) by mouth daily. 90 tablet 3   famotidine (PEPCID) 20 MG tablet Take 20 mg by mouth daily as needed for heartburn or indigestion.     Flaxseed, Linseed, (FLAX SEED OIL PO) Take 1 capsule by mouth daily.     fluticasone (FLONASE) 50 MCG/ACT nasal spray Place 1 spray into both nostrils daily as needed for allergies.     glucose blood (ONETOUCH VERIO) test strip Use to check fasting blood sugar daily and an occasional 2 hr postprandial. 100 each 11   Lancets (ONETOUCH ULTRASOFT) lancets Use to check fasting blood sugar daily and an occasional 2 hr postprandial 100 each 11   levothyroxine (SYNTHROID) 75 MCG tablet TAKE 1 TABLET BY MOUTH EVERY DAY 90 tablet 3   losartan (COZAAR) 50 MG tablet TAKE 1 TABLET BY MOUTH EVERY DAY 90 tablet 1   MAGNESIUM PO Take by mouth daily.     meloxicam (MOBIC) 7.5 MG tablet TAKE 1 TABLET BY MOUTH EVERY DAY 30 tablet 0   Multiple Vitamin (MULTIVITAMIN) tablet Take 1 tablet by mouth daily.     nitroGLYCERIN (NITROSTAT) 0.4 MG SL tablet Place 1 tablet (0.4 mg total) under the tongue every 5 (five) minutes as needed. 25 tablet 1   Skin Protectants, Misc. (EUCERIN) cream Apply 1 application topically See admin instructions. Eczema relief applies to arms and hands daily     triamcinolone cream (KENALOG) 0.1 % Apply 1 application topically 2  (two) times daily as needed (psoriasis). 454 g 0   Vitamin D, Cholecalciferol, 1000 units TABS Take 2,000 Units by mouth daily.     No current facility-administered medications for this visit.    Review of Systems  Constitutional: Negative for chills, fatigue, fever and unexpected weight change.  HENT: Negative for trouble swallowing.  Eyes: Negative for loss of vision.  Respiratory: Negative for  cough, shortness of breath and wheezing.  Cardiovascular: Negative for chest pain, leg swelling, palpitations and syncope.  GI: Negative for abdominal pain, blood in stool, diarrhea, nausea and vomiting.  GU: Negative for difficulty urinating, dysuria, frequency and hematuria.  Musculoskeletal: Negative for back pain, leg pain and joint pain.  Skin: Negative for rash.  Neurological: Negative for dizziness, headaches, light-headedness, numbness and seizures.  Psychiatric: Negative for behavioral problem, confusion, depressed mood and sleep disturbance.       Objective:  Objective   Vitals:   01/11/22 1441  BP: 120/74  Weight: 142 lb 9.6 oz (64.7 kg)  Height: $Remove'5\' 3"'vujZXhk$  (1.6 m)   Body mass index is 25.26 kg/m.  Physical Exam Vitals and nursing note reviewed. Exam conducted with a chaperone present.  Constitutional:      Appearance: Normal appearance. She is well-developed.  HENT:     Head: Normocephalic and atraumatic.  Eyes:     Extraocular Movements: Extraocular movements intact.     Pupils: Pupils are equal, round, and reactive to light.  Cardiovascular:     Rate and Rhythm: Normal rate and regular rhythm.  Pulmonary:     Effort: Pulmonary effort is normal. No respiratory distress.     Breath sounds: Normal breath sounds.  Abdominal:     General: Abdomen is flat.     Palpations: Abdomen is soft.  Genitourinary:      Comments: External: Loss of labial definition, atrophy above clitoral hood, mild erythema along labial majora Musculoskeletal:        General: No signs of  injury.  Skin:    General: Skin is warm and dry.  Neurological:     Mental Status: She is alert and oriented to person, place, and time.  Psychiatric:        Behavior: Behavior normal.        Thought Content: Thought content normal.        Judgment: Judgment normal.    Assessment/Plan:    76 yo with lichen sclerosis Recommended increasing steroid application to daily at night.  We will follow-up in 4 weeks. Provided AUGS handout on constipation  More than 15 minutes were spent face to face with the patient in the room, reviewing the medical record, labs and images, and coordinating care for the patient. The plan of management was discussed in detail and counseling was provided.    Adrian Prows MD [facility], [facility] [place] [facility] 01/11/2022 2:46 PM

## 2022-01-25 ENCOUNTER — Other Ambulatory Visit: Payer: Self-pay | Admitting: Family Medicine

## 2022-01-25 NOTE — Telephone Encounter (Signed)
Last office visit 12/01/2021 for CPE.  Last refilled 12/29/2021 for #30 with no refills.  Next Appt: 06/04/22 for 6 month follow up.

## 2022-01-26 ENCOUNTER — Other Ambulatory Visit: Payer: Self-pay

## 2022-01-26 ENCOUNTER — Ambulatory Visit (INDEPENDENT_AMBULATORY_CARE_PROVIDER_SITE_OTHER): Payer: Medicare Other | Admitting: Podiatry

## 2022-01-26 DIAGNOSIS — B351 Tinea unguium: Secondary | ICD-10-CM

## 2022-01-26 DIAGNOSIS — M79675 Pain in left toe(s): Secondary | ICD-10-CM

## 2022-01-26 DIAGNOSIS — M79674 Pain in right toe(s): Secondary | ICD-10-CM | POA: Diagnosis not present

## 2022-01-26 DIAGNOSIS — Q828 Other specified congenital malformations of skin: Secondary | ICD-10-CM | POA: Diagnosis not present

## 2022-01-27 ENCOUNTER — Encounter: Payer: Self-pay | Admitting: Podiatry

## 2022-01-27 NOTE — Progress Notes (Signed)
Subjective:  Patient ID: Renee [name], female    DOB: Nov 30, 1946,  MRN: [id number]  Chief Complaint  Patient presents with   Callouses    76 y.o. female presents with the above complaint.  Patient presents with complaint of bilateral fifth metatarsal hyperkeratotic lesion.  Patient stated they are still very painful but the debridement does help.  She states that she is got thickened elongated dystrophic toenails x10.  Mild pain on palpation.  She would like to have them debrided down as well.  She is a diabetic.  She is more diet-controlled and borderline.   Review of Systems: Negative except as noted in the HPI. Denies N/V/F/Ch.  Past Medical History:  Diagnosis Date   Allergy    Anxiety    Arthritis    CAD (coronary artery disease)    non-obstructive. cath 11/11   Diabetes mellitus    borderline diabetic - diet controlled    History of chicken pox    History of phobia    clastrophobia   Hyperlipidemia    Hypertension    Hypothyroidism    Kidney stones    hx of    Lichen sclerosus    Psoriasis    Stroke ([place])    Urinary tract infection    hx of     Current Outpatient Medications:    acetaminophen (TYLENOL) 500 MG tablet, Take 500 mg by mouth every 8 (eight) hours as needed. Taking BID, Disp: , Rfl:    aspirin EC 81 MG tablet, Take by mouth daily., Disp: , Rfl:    Blood Glucose Monitoring Suppl (ONETOUCH VERIO) w/Device KIT, by Does not apply route. Use to check fasting blood sugar daily and an occasional 2 hr postprandial, Disp: , Rfl:    Carboxymethylcellulose Sodium (THERATEARS OP), Place 1 drop into both eyes at bedtime as needed., Disp: , Rfl:    clobetasol ointment (TEMOVATE) 0.05 %, APPLY TOPICALLY DAILY AS NEEDED FOR PSORIASIS FLARES, Disp: 60 g, Rfl: 0   co-enzyme Q-10 30 MG capsule, Take 200 mg daily, Disp: 1 capsule, Rfl: 1   Dulaglutide (TRULICITY) 6.26 RS/8.5IO SOPN, Inject into the skin once a week., Disp: , Rfl:    Emollient (CERAVE) CREA, Apply 1  application topically daily as needed (psoriasis)., Disp: , Rfl:    ezetimibe (ZETIA) 10 MG tablet, Take 1 tablet (10 mg total) by mouth daily., Disp: 90 tablet, Rfl: 3   famotidine (PEPCID) 20 MG tablet, Take 20 mg by mouth daily as needed for heartburn or indigestion., Disp: , Rfl:    Flaxseed, Linseed, (FLAX SEED OIL PO), Take 1 capsule by mouth daily., Disp: , Rfl:    fluticasone (FLONASE) 50 MCG/ACT nasal spray, Place 1 spray into both nostrils daily as needed for allergies., Disp: , Rfl:    glucose blood (ONETOUCH VERIO) test strip, Use to check fasting blood sugar daily and an occasional 2 hr postprandial., Disp: 100 each, Rfl: 11   Lancets (ONETOUCH ULTRASOFT) lancets, Use to check fasting blood sugar daily and an occasional 2 hr postprandial, Disp: 100 each, Rfl: 11   levothyroxine (SYNTHROID) 75 MCG tablet, TAKE 1 TABLET BY MOUTH EVERY DAY, Disp: 90 tablet, Rfl: 3   losartan (COZAAR) 50 MG tablet, TAKE 1 TABLET BY MOUTH EVERY DAY, Disp: 90 tablet, Rfl: 1   MAGNESIUM PO, Take by mouth daily., Disp: , Rfl:    meloxicam (MOBIC) 7.5 MG tablet, TAKE 1 TABLET BY MOUTH EVERY DAY, Disp: 30 tablet, Rfl: 0   Multiple Vitamin (  MULTIVITAMIN) tablet, Take 1 tablet by mouth daily., Disp: , Rfl:    nitroGLYCERIN (NITROSTAT) 0.4 MG SL tablet, Place 1 tablet (0.4 mg total) under the tongue every 5 (five) minutes as needed., Disp: 25 tablet, Rfl: 1   Skin Protectants, Misc. (EUCERIN) cream, Apply 1 application topically See admin instructions. Eczema relief applies to arms and hands daily, Disp: , Rfl:    triamcinolone cream (KENALOG) 0.1 %, Apply 1 application topically 2 (two) times daily as needed (psoriasis)., Disp: 454 g, Rfl: 0   Vitamin D, Cholecalciferol, 1000 units TABS, Take 2,000 Units by mouth daily., Disp: , Rfl:   Social History   Tobacco Use  Smoking Status Never  Smokeless Tobacco Never    Allergies  Allergen Reactions   Other Rash    Metal    Celexa [Citalopram] Other (See  Comments)    Throat swelling and difficulty breathing   Codeine Nausea And Vomiting   Metformin Nausea And Vomiting   Nickel    Robaxin [Methocarbamol] Nausea Only   Crestor [Rosuvastatin] Rash   Simvastatin Swelling and Rash   Objective:  There were no vitals filed for this visit. There is no height or weight on file to calculate BMI. Constitutional Well developed. Well nourished.  Vascular Dorsalis pedis pulses palpable bilaterally. Posterior tibial pulses palpable bilaterally. Capillary refill normal to all digits.  No cyanosis or clubbing noted. Pedal hair growth normal.  Neurologic Normal speech. Oriented to person, place, and time. Epicritic sensation to light touch grossly present bilaterally.  Dermatologic Nail Exam: Pt has thick disfigured discolored nails with subungual debris noted bilateral entire nail hallux through fifth toenails.  Pain on palpation to the nails. No open wounds. No skin lesions.  Orthopedic: Bilateral submetatarsal 5 porokeratotic/benign skin lesion with central nucleated core noted.  Upon debridement no pinpoint bleeding noted.  No wounds noted.   Radiographs:   3 views of skeletally mature adult bilateral foot: Midfoot arthritis noted.  Plantar and posterior heel spurring noted.  Plantarflexed fifth metatarsal noted with mild signs of tailor's bunion.  There is increase in lateral deviation angle. Assessment:   1. Pain due to onychomycosis of toenails of both feet   2. Porokeratosis       Plan:  Patient was evaluated and treated and all questions answered.  Bilateral submetatarsal 5 porokeratosis x2 -X-ray to the patient the etiology of porokeratosis and was treatment options were extensively discussed.  Given the amount of pain she is having I believe she will benefit from debridement of the lesion followed by excision of the lesion.  I discussed this with the patient she states understanding like to proceed with that. -Using chisel blade and  handle the lesion was debrided down to healthy striated tissue followed by excision of central nucleated core.  Immediate pain was noted after debridement and excision.  No pinpoint bleeding noted.  Onychomycosis with pain  -Nails palliatively debrided as below. -Educated on self-care  Procedure: Nail Debridement Rationale: pain  Type of Debridement: manual, sharp debridement. Instrumentation: Nail nipper, rotary burr. Number of Nails: 10  Procedures and Treatment: Consent by patient was obtained for treatment procedures. The patient understood the discussion of treatment and procedures well. All questions were answered thoroughly reviewed. Debridement of mycotic and hypertrophic toenails, 1 through 5 bilateral and clearing of subungual debris. No ulceration, no infection noted.  Return Visit-Office Procedure: Patient instructed to return to the office for a follow up visit 3 months for continued evaluation and treatment.  Lennette Bihari  Posey Pronto, DPM    No follow-ups on file.     No follow-ups on file.

## 2022-02-01 DIAGNOSIS — D2262 Melanocytic nevi of left upper limb, including shoulder: Secondary | ICD-10-CM | POA: Diagnosis not present

## 2022-02-01 DIAGNOSIS — D2271 Melanocytic nevi of right lower limb, including hip: Secondary | ICD-10-CM | POA: Diagnosis not present

## 2022-02-01 DIAGNOSIS — Z85828 Personal history of other malignant neoplasm of skin: Secondary | ICD-10-CM | POA: Diagnosis not present

## 2022-02-01 DIAGNOSIS — L570 Actinic keratosis: Secondary | ICD-10-CM | POA: Diagnosis not present

## 2022-02-01 DIAGNOSIS — L814 Other melanin hyperpigmentation: Secondary | ICD-10-CM | POA: Diagnosis not present

## 2022-02-01 DIAGNOSIS — L821 Other seborrheic keratosis: Secondary | ICD-10-CM | POA: Diagnosis not present

## 2022-02-01 DIAGNOSIS — L400 Psoriasis vulgaris: Secondary | ICD-10-CM | POA: Diagnosis not present

## 2022-02-16 ENCOUNTER — Telehealth: Payer: Self-pay | Admitting: *Deleted

## 2022-02-16 NOTE — Telephone Encounter (Signed)
Noted will evaluated in office

## 2022-02-16 NOTE — Telephone Encounter (Signed)
Spoke to patient by telephone and was advised that she has had a sty on her eyelid for about two weeks. Patient stated that it has drained some. Patient stated that she may need an ointment or some medication to help clear the sty up. Patient scheduled for an office visit tomorrow 02/17/22 with Romilda Garret NP at 11:00 pm.

## 2022-02-16 NOTE — Telephone Encounter (Signed)
PLEASE NOTE: All timestamps contained within this report are represented as [place] Standard Time. CONFIDENTIALTY NOTICE: This fax transmission is intended only for the addressee. It contains information that is legally privileged, confidential or otherwise protected from use or disclosure. If you are not the intended recipient, you are strictly prohibited from reviewing, disclosing, copying using or disseminating any of this information or taking any action in reliance on or regarding this information. If you have received this fax in error, please notify us immediately by telephone so that we can arrange for its return to [place]. Phone: [phone], Toll-Free: [phone], Fax: [phone] Page: 1 of 1 Call Id: [id number] [facility] - Client TELEPHONE ADVICE RECORD [facility] Patient Name: Renee [name] Gender: Female DOB: [birth year] Age: 76 Y 63 M 15 D Return Phone Number: [phone] (Primary) Address: [place]/ [place]/ [place] [address] Client [facility] - Client Client Site [facility] Day Provider Eliezer Lofts - MD Contact Type Call Who Is Calling Patient / Member / Family / Caregiver Call Type Triage / Clinical Relationship To Patient Self Return Phone Number [phone] (Primary) Chief Complaint Eye Pain Reason for Call Symptomatic / Request for [name] she has a sty on her right eye and would like to know what to do to treat it. Translation No Disp. Time Eilene Ghazi Time) Disposition Final User 02/16/2022 2:10:02 PM Attempt made - message left Pryor Montes 02/16/2022 2:37:34 PM Send To RN Personal Raphael Gibney, RN, Vera 02/16/2022 2:40:11 PM Attempt made - message left Ysidro Evert, RN, Levada Dy 02/16/2022 2:55:52 PM FINAL ATTEMPT MADE - no message left Yes Ysidro Evert, RN, Levada Dy

## 2022-02-17 ENCOUNTER — Encounter: Payer: Self-pay | Admitting: Nurse Practitioner

## 2022-02-17 ENCOUNTER — Other Ambulatory Visit: Payer: Self-pay

## 2022-02-17 ENCOUNTER — Ambulatory Visit (INDEPENDENT_AMBULATORY_CARE_PROVIDER_SITE_OTHER): Payer: Medicare Other | Admitting: Nurse Practitioner

## 2022-02-17 VITALS — BP 140/62 | HR 78 | Temp 97.4°F | Ht 63.0 in | Wt 142.2 lb

## 2022-02-17 DIAGNOSIS — H00014 Hordeolum externum left upper eyelid: Secondary | ICD-10-CM | POA: Diagnosis not present

## 2022-02-17 MED ORDER — ERYTHROMYCIN 5 MG/GM OP OINT: 1.0000 "application " | TOPICAL_OINTMENT | Freq: Three times a day (TID) | OPHTHALMIC | 0 refills | Status: DC

## 2022-02-17 NOTE — Progress Notes (Signed)
Acute Office Visit  Subjective:    Patient ID: Renee [name], female    DOB: 1946-01-22, 76 y.o.   MRN: [id number]  Chief Complaint  Patient presents with   Eye Problem    Had a bump come up on left eyelid for 2 weeks, thinks its a stye. She has been using hot compress. Some burning sensation present. At first left eye was swollen     Patient is in today for eye problem  Started approx 2 weeks ago on her left eye Has gotten better. States that it was double in size and had eye swelling and had purulent discharge. No fever and chills. Swelling went down. No visual disturbance. Some burning.   Warm compress, tea bag warm that helped some. Does not wear contacts or glasses. Hx of cataracts. Last visit in May 2022 to her eye professional. Next visit in may 2023  Past Medical History:  Diagnosis Date   Allergy    Anxiety    Arthritis    CAD (coronary artery disease)    non-obstructive. cath 11/11   Diabetes mellitus    borderline diabetic - diet controlled    History of chicken pox    History of phobia    clastrophobia   Hyperlipidemia    Hypertension    Hypothyroidism    Kidney stones    hx of    Lichen sclerosus    Psoriasis    Stroke ([place])    Urinary tract infection    hx of     Past Surgical History:  Procedure Laterality Date   APPENDECTOMY     CARDIAC CATHETERIZATION     2011   CARPAL TUNNEL RELEASE     Left trigger finger   CATARACT EXTRACTION W/PHACO Left 06/06/2018   Procedure: CATARACT EXTRACTION PHACO AND INTRAOCULAR LENS PLACEMENT ([place]);  Surgeon: Birder Robson, MD;  Location: [facility] ORS;  Service: Ophthalmology;  Laterality: Left;  [place]  00:23 AP% 16.0 CDE 3.69 Fluid pack lot # [id number]   CATARACT EXTRACTION W/PHACO Right 09/19/2018   Procedure: CATARACT EXTRACTION PHACO AND INTRAOCULAR LENS PLACEMENT ([facility]);  Surgeon: Birder Robson, MD;  Location: [facility] ORS;  Service: Ophthalmology;  Laterality: Right;  [place] 00:29.5 AP% 17.0 CDE$ 5.03 Fluid pack  lot # [id number]   COLONOSCOPY WITH PROPOFOL N/A 05/13/2017   Procedure: COLONOSCOPY WITH PROPOFOL;  Surgeon: Manya Silvas, MD;  Location: [facility] ENDOSCOPY;  Service: Endoscopy;  Laterality: N/A;   ESOPHAGOGASTRODUODENOSCOPY (EGD) WITH PROPOFOL N/A 01/04/2020   Procedure: ESOPHAGOGASTRODUODENOSCOPY (EGD) WITH PROPOFOL;  Surgeon: [name], MD;  Location: [facility] END[facility]COPY;  Service: Gastroenterology;  Laterality: N/A;   EYE SURGERY  2004   lasik - right eye    JOINT REPLACEMENT     KNEE SURGERY Right 2015   arthroscopy   TOTAL KNEE ARTHROPLASTY Bilateral 06/12/2015   Procedure: TOTAL KNEE BILATERAL;  Surgeon: Gaynelle Arabian, MD;  Location: [facility] ORS;  Service: Orthopedics;  Laterality: Bilateral;  and epidural   TUBAL LIGATION     Tubes tied     and untied. and tied again    Family History  Problem Relation Age of Onset   COPD Mother    Diabetes Mother    Hypertension Mother    Hypertension Father    Diabetes Sister    Hypertension Sister    Diabetes Brother    Arthritis Brother    Heart disease Brother    Arthritis Brother    Diabetes Brother    Heart disease Brother  Stomach cancer Maternal Grandfather    Stroke Maternal Grandfather    Breast cancer Daughter 52       Double mastectomy    Social History   Socioeconomic History   Marital status: Divorced    Spouse name: Not on file   Number of children: 6   Years of education: Not on file   Highest education level: Not on file  Occupational History   Occupation: Retired  Tobacco Use   Smoking status: Never   Smokeless tobacco: Never  Vaping Use   Vaping Use: Never used  Substance and Sexual Activity   Alcohol use: Yes    Alcohol/week: 0.0 standard drinks    Comment: occasionally   Drug use: No   Sexual activity: Not Currently    Birth control/protection: Post-menopausal  Other Topics Concern   Not on file  Social History Narrative   Lives alone   Right Handed   Drink 1 cups caffeine daily   Social  Determinants of Health   Financial Resource Strain: Low Risk    Difficulty of Paying Living Expenses: Not very hard  Food Insecurity: No Food Insecurity   Worried About Charity fundraiser in the Last Year: Never true   Ran Out of Food in the Last Year: Never true  Transportation Needs: No Transportation Needs   Lack of Transportation (Medical): No   Lack of Transportation (Non-Medical): No  Physical Activity: Sufficiently Active   Days of Exercise per Week: 3 days   Minutes of Exercise per Session: 60 min  Stress: No Stress Concern Present   Feeling of Stress : Not at all  Social Connections: Socially Isolated   Frequency of Communication with Friends and Family: More than three times a week   Frequency of Social Gatherings with Friends and Family: Twice a week   Attends Religious Services: Never   Marine scientist or Organizations: No   Attends Music therapist: Never   Marital Status: Divorced  Human resources officer Violence: Not At Risk   Fear of Current or Ex-Partner: No   Emotionally Abused: No   Physically Abused: No   Sexually Abused: No    Outpatient Medications Prior to Visit  Medication Sig Dispense Refill   acetaminophen (TYLENOL) 500 MG tablet Take 500 mg by mouth every 8 (eight) hours as needed. Taking BID     aspirin EC 81 MG tablet Take by mouth daily.     Blood Glucose Monitoring Suppl (ONETOUCH VERIO) w/Device KIT by Does not apply route. Use to check fasting blood sugar daily and an occasional 2 hr postprandial     Carboxymethylcellulose Sodium (THERATEARS OP) Place 1 drop into both eyes at bedtime as needed.     clobetasol ointment (TEMOVATE) 0.05 % APPLY TOPICALLY DAILY AS NEEDED FOR PSORIASIS FLARES 60 g 0   co-enzyme Q-10 30 MG capsule Take 200 mg daily 1 capsule 1   Dulaglutide (TRULICITY) 3.66 YQ/0.3KV SOPN Inject into the skin once a week.     Emollient (CERAVE) CREA Apply 1 application topically daily as needed (psoriasis).     ezetimibe  (ZETIA) 10 MG tablet Take 1 tablet (10 mg total) by mouth daily. 90 tablet 3   famotidine (PEPCID) 20 MG tablet Take 20 mg by mouth daily as needed for heartburn or indigestion.     Flaxseed, Linseed, (FLAX SEED OIL PO) Take 1 capsule by mouth daily.     fluticasone (FLONASE) 50 MCG/ACT nasal spray Place 1 spray into both  nostrils daily as needed for allergies.     glucose blood (ONETOUCH VERIO) test strip Use to check fasting blood sugar daily and an occasional 2 hr postprandial. 100 each 11   Lancets (ONETOUCH ULTRASOFT) lancets Use to check fasting blood sugar daily and an occasional 2 hr postprandial 100 each 11   levothyroxine (SYNTHROID) 75 MCG tablet TAKE 1 TABLET BY MOUTH EVERY DAY 90 tablet 3   losartan (COZAAR) 50 MG tablet TAKE 1 TABLET BY MOUTH EVERY DAY 90 tablet 1   MAGNESIUM PO Take by mouth daily.     meloxicam (MOBIC) 7.5 MG tablet TAKE 1 TABLET BY MOUTH EVERY DAY 30 tablet 0   Multiple Vitamin (MULTIVITAMIN) tablet Take 1 tablet by mouth daily.     nitroGLYCERIN (NITROSTAT) 0.4 MG SL tablet Place 1 tablet (0.4 mg total) under the tongue every 5 (five) minutes as needed. 25 tablet 1   Skin Protectants, Misc. (EUCERIN) cream Apply 1 application topically See admin instructions. Eczema relief applies to arms and hands daily     triamcinolone cream (KENALOG) 0.1 % Apply 1 application topically 2 (two) times daily as needed (psoriasis). 454 g 0   Vitamin D, Cholecalciferol, 1000 units TABS Take 2,000 Units by mouth daily.     No facility-administered medications prior to visit.    Allergies  Allergen Reactions   Other Rash    Metal    Celexa [Citalopram] Other (See Comments)    Throat swelling and difficulty breathing   Codeine Nausea And Vomiting   Metformin Nausea And Vomiting   Nickel    Robaxin [Methocarbamol] Nausea Only   Crestor [Rosuvastatin] Rash   Simvastatin Swelling and Rash    Review of Systems  Constitutional:  Negative for chills and fever.  Eyes:   Positive for pain, discharge and redness. Negative for itching and visual disturbance.      Objective:    Physical Exam Vitals and nursing note reviewed.  Constitutional:      Appearance: Normal appearance.  Eyes:     General: Vision grossly intact. Gaze aligned appropriately.     Extraocular Movements: Extraocular movements intact.     Pupils: Pupils are equal, round, and reactive to light.   Cardiovascular:     Rate and Rhythm: Normal rate and regular rhythm.  Pulmonary:     Effort: Pulmonary effort is normal.     Breath sounds: Normal breath sounds.  Neurological:     Mental Status: She is alert.    BP 140/62    Pulse 78    Temp (!) 97.4 F (36.3 C)    Ht 5' 3" (1.6 m)    Wt 142 lb 3 oz (64.5 kg)    SpO2 96%    BMI 25.19 kg/m  Wt Readings from Last 3 Encounters:  02/17/22 142 lb 3 oz (64.5 kg)  01/11/22 142 lb 9.6 oz (64.7 kg)  12/11/21 140 lb 4 oz (63.6 kg)    Health Maintenance Due  Topic Date Due   Zoster Vaccines- Shingrix (1 of 2) Never done   COVID-19 Vaccine (4 - Booster for Pfizer series) 12/31/2020   FOOT EXAM  09/29/2021    There are no preventive care reminders to display for this patient.   Lab Results  Component Value Date   TSH 2.67 11/27/2021   Lab Results  Component Value Date   WBC 7.9 06/17/2021   HGB 15.6 (H) 06/17/2021   HCT 46.0 06/17/2021   MCV 97.0 06/17/2021   PLT  374 06/17/2021   Lab Results  Component Value Date   NA 141 08/14/2021   K 4.1 08/14/2021   CO2 27 08/14/2021   GLUCOSE 115 (H) 08/14/2021   BUN 21 08/14/2021   CREATININE 0.68 08/14/2021   BILITOT 0.4 08/14/2021   ALKPHOS 59 08/14/2021   AST 21 08/14/2021   ALT 19 08/14/2021   PROT 7.2 08/14/2021   ALBUMIN 4.2 08/14/2021   CALCIUM 9.6 08/14/2021   ANIONGAP 9 06/17/2021   GFR 84.28 08/14/2021   Lab Results  Component Value Date   CHOL 146 08/14/2021   Lab Results  Component Value Date   HDL 45.80 08/14/2021   Lab Results  Component Value Date    LDLCALC 84 08/14/2021   Lab Results  Component Value Date   TRIG 81.0 08/14/2021   Lab Results  Component Value Date   CHOLHDL 3 08/14/2021   Lab Results  Component Value Date   HGBA1C 6.3 (H) 11/27/2021       Assessment & Plan:   Problem List Items Addressed This Visit       Other   Hordeolum externum of left upper eyelid - Primary    Exam consistent with stye of left upper eyelid.  Will start erythromycin ointment 3 times daily for 5 to 7 days.  Did discuss signs and symptoms when to be seen urgent or emergently and when to return to clinic.  Patient states is improved greatly still quite noticeable on exam.  Also discussed in hygiene and keeping hands away from her eyes.      Relevant Medications   erythromycin ophthalmic ointment     Meds ordered this encounter  Medications   erythromycin ophthalmic ointment    Sig: Place 1 application into the left eye 3 (three) times daily. For 5-7 days    Dispense:  3.5 g    Refill:  0    Order Specific Question:   Supervising Provider    Answer:   Loura Pardon A [[id number]]   This visit occurred during the SARS-CoV-2 public health emergency.  Safety protocols were in place, including screening questions prior to the visit, additional usage of staff PPE, and extensive cleaning of exam room while observing appropriate contact time as indicated for disinfecting solutions.    Romilda Garret, NP

## 2022-02-17 NOTE — Assessment & Plan Note (Signed)
Exam consistent with stye of left upper eyelid.  Will start erythromycin ointment 3 times daily for 5 to 7 days.  Did discuss signs and symptoms when to be seen urgent or emergently and when to return to clinic.  Patient states is improved greatly still quite noticeable on exam.  Also discussed in hygiene and keeping hands away from her eyes.

## 2022-02-17 NOTE — Patient Instructions (Signed)
Nice to see you today I sent in the eye ointment, can use it for the next 5-7 days.  If the eye swells up, vision changes, you start having fever be seen or let us know.  Follow up if no improvement.

## 2022-02-25 ENCOUNTER — Other Ambulatory Visit: Payer: Self-pay

## 2022-02-25 ENCOUNTER — Encounter: Payer: Self-pay | Admitting: Obstetrics and Gynecology

## 2022-02-25 ENCOUNTER — Ambulatory Visit (INDEPENDENT_AMBULATORY_CARE_PROVIDER_SITE_OTHER): Payer: Medicare Other | Admitting: Obstetrics and Gynecology

## 2022-02-25 VITALS — BP 128/70 | Ht 63.0 in | Wt 142.0 lb

## 2022-02-25 DIAGNOSIS — L900 Lichen sclerosus et atrophicus: Secondary | ICD-10-CM

## 2022-02-25 NOTE — Progress Notes (Signed)
Patient ID: Renee [name], female   DOB: [birth year], 76 y.o.   MRN: [id number]  Reason for Consult: Follow-up   Referred by Jinny Sanders, MD  Subjective:     HPI:  Renee [name] is a 76 y.o. female she is following up today regarding lichen sclerosus.  She is asked previously elapsed visit to apply the ointment daily.  However she reports that the symptoms of lichen sclerosus resolved as well as the lesions on her hands and she is discontinued the ointment.  Gynecological History  No LMP recorded. Patient is postmenopausal. M Past Medical History:  Diagnosis Date   Allergy    Anxiety    Arthritis    CAD (coronary artery disease)    non-obstructive. cath 11/11   Diabetes mellitus    borderline diabetic - diet controlled    History of chicken pox    History of phobia    clastrophobia   Hyperlipidemia    Hypertension    Hypothyroidism    Kidney stones    hx of    Lichen sclerosus    Psoriasis    Stroke ([place])    Urinary tract infection    hx of    Family History  Problem Relation Age of Onset   COPD Mother    Diabetes Mother    Hypertension Mother    Hypertension Father    Diabetes Sister    Hypertension Sister    Diabetes Brother    Arthritis Brother    Heart disease Brother    Arthritis Brother    Diabetes Brother    Heart disease Brother    Stomach cancer Maternal Grandfather    Stroke Maternal Grandfather    Breast cancer Daughter 86       Double mastectomy   Past Surgical History:  Procedure Laterality Date   APPENDECTOMY     CARDIAC CATHETERIZATION     2011   CARPAL TUNNEL RELEASE     Left trigger finger   CATARACT EXTRACTION W/PHACO Left 06/06/2018   Procedure: CATARACT EXTRACTION PHACO AND INTRAOCULAR LENS PLACEMENT ([place]);  Surgeon: Birder Robson, MD;  Location: [facility] ORS;  Service: Ophthalmology;  Laterality: Left;  [place]  00:23 AP% 16.0 CDE 3.69 Fluid pack lot # [id number]   CATARACT EXTRACTION W/PHACO Right 09/19/2018   Procedure: CATARACT  EXTRACTION PHACO AND INTRAOCULAR LENS PLACEMENT ([facility]);  Surgeon: Birder Robson, MD;  Location: [facility] ORS;  Service: Ophthalmology;  Laterality: Right;  [place] 00:29.5 AP% 17.0 CDE$ 5.03 Fluid pack lot # [id number]   COLONOSCOPY WITH PROPOFOL N/A 05/13/2017   Procedure: COLONOSCOPY WITH PROPOFOL;  Surgeon: Manya Silvas, MD;  Location: [facility] ENDOSCOPY;  Service: Endoscopy;  Laterality: N/A;   ESOPHAGOGASTRODUODENOSCOPY (EGD) WITH PROPOFOL N/A 01/04/2020   Procedure: ESOPHAGOGASTRODUODENOSCOPY (EGD) WITH PROPOFOL;  Surgeon: [name], MD;  Location: [facility] ENDOSCOPY;  Service: Gastroenterology;  Laterality: N/A;   EYE SURGERY  2004   lasik - right eye    JOINT REPLACEMENT     KNEE SURGERY Right 2015   arthroscopy   TOTAL KNEE ARTHROPLASTY Bilateral 06/12/2015   Procedure: TOTAL KNEE BILATERAL;  Surgeon: Gaynelle Arabian, MD;  Location: [facility] ORS;  Service: Orthopedics;  Laterality: Bilateral;  and epidural   TUBAL LIGATION     Tubes tied     and untied. and tied again    Short Social History:  Social History   Tobacco Use   Smoking status: Never   Smokeless tobacco: Never  Substance Use Topics  Alcohol use: Yes    Alcohol/week: 0.0 standard drinks    Comment: occasionally    Allergies  Allergen Reactions   Other Rash    Metal    Celexa [Citalopram] Other (See Comments)    Throat swelling and difficulty breathing   Codeine Nausea And Vomiting   Metformin Nausea And Vomiting   Nickel    Robaxin [Methocarbamol] Nausea Only   Crestor [Rosuvastatin] Rash   Simvastatin Swelling and Rash    Current Outpatient Medications  Medication Sig Dispense Refill   acetaminophen (TYLENOL) 500 MG tablet Take 500 mg by mouth every 8 (eight) hours as needed. Taking BID     aspirin EC 81 MG tablet Take by mouth daily.     Blood Glucose Monitoring Suppl (ONETOUCH VERIO) w/Device KIT by Does not apply route. Use to check fasting blood sugar daily and an occasional 2 hr postprandial      Carboxymethylcellulose Sodium (THERATEARS OP) Place 1 drop into both eyes at bedtime as needed.     clobetasol ointment (TEMOVATE) 0.05 % APPLY TOPICALLY DAILY AS NEEDED FOR PSORIASIS FLARES 60 g 0   co-enzyme Q-10 30 MG capsule Take 200 mg daily 1 capsule 1   Dulaglutide (TRULICITY) 6.22 WL/7.9GX SOPN Inject into the skin once a week.     Emollient (CERAVE) CREA Apply 1 application topically daily as needed (psoriasis).     erythromycin ophthalmic ointment Place 1 application into the left eye 3 (three) times daily. For 5-7 days 3.5 g 0   ezetimibe (ZETIA) 10 MG tablet Take 1 tablet (10 mg total) by mouth daily. 90 tablet 3   famotidine (PEPCID) 20 MG tablet Take 20 mg by mouth daily as needed for heartburn or indigestion.     Flaxseed, Linseed, (FLAX SEED OIL PO) Take 1 capsule by mouth daily.     fluticasone (FLONASE) 50 MCG/ACT nasal spray Place 1 spray into both nostrils daily as needed for allergies.     glucose blood (ONETOUCH VERIO) test strip Use to check fasting blood sugar daily and an occasional 2 hr postprandial. 100 each 11   Lancets (ONETOUCH ULTRASOFT) lancets Use to check fasting blood sugar daily and an occasional 2 hr postprandial 100 each 11   levothyroxine (SYNTHROID) 75 MCG tablet TAKE 1 TABLET BY MOUTH EVERY DAY 90 tablet 3   losartan (COZAAR) 50 MG tablet TAKE 1 TABLET BY MOUTH EVERY DAY 90 tablet 1   MAGNESIUM PO Take by mouth daily.     meloxicam (MOBIC) 7.5 MG tablet TAKE 1 TABLET BY MOUTH EVERY DAY 30 tablet 0   Multiple Vitamin (MULTIVITAMIN) tablet Take 1 tablet by mouth daily.     nitroGLYCERIN (NITROSTAT) 0.4 MG SL tablet Place 1 tablet (0.4 mg total) under the tongue every 5 (five) minutes as needed. 25 tablet 1   Skin Protectants, Misc. (EUCERIN) cream Apply 1 application topically See admin instructions. Eczema relief applies to arms and hands daily     triamcinolone cream (KENALOG) 0.1 % Apply 1 application topically 2 (two) times daily as needed (psoriasis).  454 g 0   Vitamin D, Cholecalciferol, 1000 units TABS Take 2,000 Units by mouth daily.     No current facility-administered medications for this visit.    Review of Systems  Constitutional: Negative for chills, fatigue, fever and unexpected weight change.  HENT: Negative for trouble swallowing.  Eyes: Negative for loss of vision.  Respiratory: Negative for cough, shortness of breath and wheezing.  Cardiovascular: Negative for chest pain,  leg swelling, palpitations and syncope.  GI: Negative for abdominal pain, blood in stool, diarrhea, nausea and vomiting.  GU: Negative for difficulty urinating, dysuria, frequency and hematuria.  Musculoskeletal: Negative for back pain, leg pain and joint pain.  Skin: Negative for rash.  Neurological: Negative for dizziness, headaches, light-headedness, numbness and seizures.  Psychiatric: Negative for behavioral problem, confusion, depressed mood and sleep disturbance.       Objective:  Objective   Vitals:   02/25/22 1430  BP: 128/70  Weight: 142 lb (64.4 kg)  Height: $Remove'5\' 3"'AdwPbtT$  (1.6 m)   Body mass index is 25.15 kg/m.  Physical Exam Vitals and nursing note reviewed. Exam conducted with a chaperone present.  Constitutional:      Appearance: Normal appearance. She is well-developed.  HENT:     Head: Normocephalic and atraumatic.  Eyes:     Extraocular Movements: Extraocular movements intact.     Pupils: Pupils are equal, round, and reactive to light.  Cardiovascular:     Rate and Rhythm: Normal rate and regular rhythm.  Pulmonary:     Effort: Pulmonary effort is normal. No respiratory distress.     Breath sounds: Normal breath sounds.  Abdominal:     General: Abdomen is flat.     Palpations: Abdomen is soft.  Genitourinary:    Comments: External: Loss of labia minora definition.  Mild erythema. Musculoskeletal:        General: No signs of injury.  Skin:    General: Skin is warm and dry.  Neurological:     Mental Status: She is alert  and oriented to person, place, and time.  Psychiatric:        Behavior: Behavior normal.        Thought Content: Thought content normal.        Judgment: Judgment normal.    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  Patient stated understanding of this and will modify her behavior. Plan to follow-up in 6 months for examination of the vulva.  More than 10 minutes were spent face to face with the patient in the room, reviewing the medical record, labs and images, and coordinating care for the patient. The plan of management was discussed in detail and counseling was provided.      Adrian Prows MD [facility], [facility] 02/25/2022 2:56 PM

## 2022-02-26 ENCOUNTER — Other Ambulatory Visit: Payer: Self-pay | Admitting: Family Medicine

## 2022-02-26 NOTE — Telephone Encounter (Signed)
Last office visit 02/17/22 for eye problem with [facility].  Last refilled 01/25/2022 for #30 with no refills.  Next Appt: 06/04/2022 for 6 month follow. ?

## 2022-03-16 ENCOUNTER — Encounter: Payer: Self-pay | Admitting: Neurology

## 2022-03-16 ENCOUNTER — Ambulatory Visit (INDEPENDENT_AMBULATORY_CARE_PROVIDER_SITE_OTHER): Payer: Medicare Other | Admitting: Neurology

## 2022-03-16 VITALS — BP 138/70 | HR 86 | Ht 62.75 in | Wt 142.0 lb

## 2022-03-16 DIAGNOSIS — Z8673 Personal history of transient ischemic attack (TIA), and cerebral infarction without residual deficits: Secondary | ICD-10-CM

## 2022-03-16 DIAGNOSIS — G3184 Mild cognitive impairment, so stated: Secondary | ICD-10-CM

## 2022-03-16 DIAGNOSIS — R413 Other amnesia: Secondary | ICD-10-CM | POA: Diagnosis not present

## 2022-03-16 NOTE — Patient Instructions (Signed)
I had a long discussion with the patient and her daughter regarding her memory loss and mild cognitive impairment which appears stable.  She was encouraged to increase participation in cognitively challenging activities like solving crossword puzzles, playing bridge and sudoku.  We also discussed memory compensation strategies.  She seems also stable from neurovascular standpoint.  Continue aspirin for stroke prevention and maintain aggressive risk factor modification strict control of hypertension with blood pressure goal below 130/90, lipids with LDL cholesterol goal below 70 mg percent and diabetes with hemoglobin A1c goal below 6.5%.  She will return for follow-up in the future in 6 months or call earlier if necessary. ?Memory Compensation Strategies ? ?Use "WARM" strategy. ? W= write it down ? A= associate it ? R= repeat it ? M= make a mental note ? ?2.   You can keep a Social worker. ? Use a 3-ring notebook with sections for the following: calendar, important names and phone numbers,  medications, doctors' names/phone numbers, lists/reminders, and a section to journal what you did  each day.  ? ?3.    Use a calendar to write appointments down. ? ?4.    Write yourself a schedule for the day. ? This can be placed on the calendar or in a separate section of the Memory Notebook.  Keeping a  regular schedule can help memory. ? ?5.    Use medication organizer with sections for each day or morning/evening pills. ? You may need help loading it ? ?6.    Keep a basket, or pegboard by the door. ? Place items that you need to take out with you in the basket or on the pegboard.  You may also want to  include a message board for reminders. ? ?7.    Use sticky notes. ? Place sticky notes with reminders in a place where the task is performed.  For example: " turn off the  stove" placed by the stove, "lock the door" placed on the door at eye level, " take your medications" on  the bathroom mirror or by the place where you  normally take your medications. ? ?8.    Use alarms/timers. ? Use while cooking to remind yourself to check on food or as a reminder to take your medicine, or as a  reminder to make a call, or as a reminder to perform another task, etc. ? ?

## 2022-03-16 NOTE — Progress Notes (Signed)
? ?[facility] ?[address] ?[place] [address] ?[phone] ? ?     OFFICE FOLLOW UP VISIT NOTE ? ?[name] ?Date of Birth:  07-16-1946 ?Medical Record Number:  [id number]  ? ?Referring MD:  Renee [name] ? ?Reason for Referral:  TIA ? ?HPI: Initial visit 06/25/2021 :Renee [name] is a pleasant 76 year old Caucasian lady who is referred today for initial office consultation visit for TIA.  History is obtained from the patient, her daughter as well as review of electronic medical records and I personally reviewed pertinent imaging films in PACS.  She has a past medical history of coronary artery disease, diabetes, hypertension, hyperlipidemia, hypothyroidism and arthritis.  She states she had episode of sudden onset of blurred vision and dark vision when she woke up from sleep on 06/17/2021.  She was able to see but vision was not clear.  She checked her blood sugar which was fine.  She also noticed that she has some trouble thinking straight and trouble speaking.  She went to a restaurant where she had trouble ordering her food eventually she was able to place an order.  She subsequently had a meal and sat in a car and while driving to the gynecologist office noticed numbness in the right hand from the wrist down.  She also felt that her right cheek of her cheek was being pulled to the side.  When she continues with annual gynecology she advised her to go to the ER.  Her symptoms are completely resolved by the time she reached there.  Patient denied any accompanying headache, double vision, vertigo, gait or balance problems.  Patient said she had a somewhat similar episode a couple of years ago when she also had some difficulty with vision, focusing as well as trouble thinking and word finding which also did not stay long.  She did not have a headache after that.  She did not seek medical help at that time.  Patient does have a history of remote migraines but she states she is  outgrown them and has not had one for years.  2 of her daughters also have migraines.  She denies any other prior history of strokes or TIAs or seizures.  She had a CT scan of the head on 06/17/2021 which showed possibly a right basal ganglia lacunar however an MRI scan done the next day showed no evidence of acute stroke or old strokes and only showed age-related atrophy and white matter changes.  Lab work on 04/27/2021 showed hemoglobin A1c of 7.3 and LDL cholesterol 140 mg percent.  Patient also has complaints of headache with posterior neck pain with tenderness in the left occipital groove.  She also complains of muscle aches and pains and in fact has an appointment soon to see rheumatologist Dr. Estanislado [name] for these complaints.Marland Kitchen ?Update 09/16/2021 : She returns for follow-up after last visit 2 and half months ago.  She is accompanied by her daughter Renee [name].  Patient continues to have memory difficulties mostly for short-term.  She still lives independently and manages most of her activities of daily living though her daughters live nearby and provide support.  She has had no further episodes of TIA or blurred vision no complicated migraine episodes.  Main complaint today is chronic right shoulder pain which limits her range of motion.  She has gotten some injection of steroids for chronic pain that has not helped.  She is currently doing some home therapy.  She had recent lab work  on 08/24/2021 which showed borderline hemoglobin A1c of 6.6 and LDL cholesterol of 84 mg percent.  She remains on Zetia and previously has been intolerant to Crestor and Zocor.  She had CT angiogram of the brain and neck done on 07/17/2021 which showed no significant large vessel intracranial or extracranial stenosis.  There was 1 to 2 mm paraclinoid left ICA infundibulum versus tiny aneurysm noted.  Temporal artery ultrasound on 07/16/2021 was negative for halo sign. ?Update 03/16/2022 ; she returns for follow-up after last visit 6 months ago.   She is accompanied by her daughter.  Is doing well.  She continues to live alone and is independent in actives of daily living.  She is still driving.  She does not finances except online billing which her daughter handles.  She continues to have short-term memory difficulties but these are nonprogressive and unchanged.  She does write things down and uses a calendar.  She does not do many activities like solving puzzles or sudoku as she does not like it.  She has not had any stroke or TIA symptoms.  She remains on aspirin she is tolerating well without bruising or bleeding.  Her blood pressure is under good control today it is 138/70.  She states her sugars are doing pretty good.  She saw her primary care physician and all lab work is satisfactory.  On Mini-Mental status testing by my assistant today she scored 21/30 but probably would not have a good time is on my testing she scored recall 2/3 and able to name 14 animals will can walk on 4 legs and clock drawing she scored normal 4/4 ?ROS:   ?14 system review of systems is positive for blurred vision, memory difficulties speech difficulty, difficulty figuring out things, numbness and headache and neck pain and all other systems negative ? ?PMH:  ?Past Medical History:  ?Diagnosis Date  ? Allergy   ? Anxiety   ? Arthritis   ? CAD (coronary artery disease)   ? non-obstructive. cath 11/11  ? Diabetes mellitus   ? borderline diabetic - diet controlled   ? History of chicken pox   ? History of phobia   ? clastrophobia  ? Hyperlipidemia   ? Hypertension   ? Hypothyroidism   ? Kidney stones   ? hx of   ? Lichen sclerosus   ? Psoriasis   ? Stroke [facility])   ? Urinary tract infection   ? hx of   ? ? ?Social History:  ?Social History  ? ?Socioeconomic History  ? Marital status: Divorced  ?  Spouse name: Not on file  ? Number of children: 6  ? Years of education: Not on file  ? Highest education level: Not on file  ?Occupational History  ? Occupation: Retired  ?Tobacco Use  ?  Smoking status: Never  ? Smokeless tobacco: Never  ?Vaping Use  ? Vaping Use: Never used  ?Substance and Sexual Activity  ? Alcohol use: Yes  ?  Alcohol/week: 0.0 standard drinks  ?  Comment: occasionally  ? Drug use: No  ? Sexual activity: Not Currently  ?  Birth control/protection: Post-menopausal  ?Other Topics Concern  ? Not on file  ?Social History Narrative  ? Lives alone  ? Right Handed  ? Drink 1 cups caffeine daily  ? ?Social Determinants of Health  ? ?Financial Resource Strain: Low Risk   ? Difficulty of Paying Living Expenses: Not very hard  ?Food Insecurity: No Food Insecurity  ? Worried About  Running Out of Food in the Last Year: Never true  ? Ran Out of Food in the Last Year: Never true  ?Transportation Needs: No Transportation Needs  ? Lack of Transportation (Medical): No  ? Lack of Transportation (Non-Medical): No  ?Physical Activity: Sufficiently Active  ? Days of Exercise per Week: 3 days  ? Minutes of Exercise per Session: 60 min  ?Stress: No Stress Concern Present  ? Feeling of Stress : Not at all  ?Social Connections: Socially Isolated  ? Frequency of Communication with Friends and Family: More than three times a week  ? Frequency of Social Gatherings with Friends and Family: Twice a week  ? Attends Religious Services: Never  ? Active Member of Clubs or Organizations: No  ? Attends Archivist Meetings: Never  ? Marital Status: Divorced  ?Intimate Partner Violence: Not At Risk  ? Fear of Current or Ex-Partner: No  ? Emotionally Abused: No  ? Physically Abused: No  ? Sexually Abused: No  ? ? ?Medications:   ?Current Outpatient Medications on File Prior to Visit  ?Medication Sig Dispense Refill  ? acetaminophen (TYLENOL) 500 MG tablet Take 500 mg by mouth every 8 (eight) hours as needed. Taking BID    ? aspirin EC 81 MG tablet Take by mouth daily.    ? Blood Glucose Monitoring Suppl (ONETOUCH VERIO) w/Device KIT by Does not apply route. Use to check fasting blood sugar daily and an  occasional 2 hr postprandial    ? Carboxymethylcellulose Sodium (THERATEARS OP) Place 1 drop into both eyes at bedtime as needed.    ? clobetasol ointment (TEMOVATE) 0.05 % APPLY TOPICALLY DAILY AS NEEDED FOR PSO

## 2022-04-05 ENCOUNTER — Other Ambulatory Visit: Payer: Self-pay | Admitting: Family Medicine

## 2022-04-22 ENCOUNTER — Telehealth: Payer: Self-pay

## 2022-04-22 NOTE — Progress Notes (Signed)
? ? ?  Chronic Care Management ?Pharmacy Assistant  ? ?Name: Renee [name]  MRN: [id number] DOB: 05-11-1946 ? ?Reason for Encounter: CCM Counsellor) ?  ?Medications: ?Outpatient Encounter Medications as of 04/22/2022  ?Medication Sig Note  ? acetaminophen (TYLENOL) 500 MG tablet Take 500 mg by mouth every 8 (eight) hours as needed. Taking BID   ? aspirin EC 81 MG tablet Take by mouth daily.   ? Blood Glucose Monitoring Suppl (ONETOUCH VERIO) w/Device KIT by Does not apply route. Use to check fasting blood sugar daily and an occasional 2 hr postprandial   ? Carboxymethylcellulose Sodium (THERATEARS OP) Place 1 drop into both eyes at bedtime as needed.   ? clobetasol ointment (TEMOVATE) 0.05 % APPLY TOPICALLY DAILY AS NEEDED FOR PSORIASIS FLARES   ? co-enzyme Q-10 30 MG capsule Take 200 mg daily   ? Dulaglutide (TRULICITY) 3.84 [place]/6.4WO SOPN Inject into the skin once a week.   ? Emollient (CERAVE) CREA Apply 1 application topically daily as needed (psoriasis). 11/17/2016: Mix with triamcinolone  ? erythromycin ophthalmic ointment Place 1 application into the left eye 3 (three) times daily. For 5-7 days   ? ezetimibe (ZETIA) 10 MG tablet Take 1 tablet (10 mg total) by mouth daily.   ? famotidine (PEPCID) 20 MG tablet Take 20 mg by mouth daily as needed for heartburn or indigestion.   ? Flaxseed, Linseed, (FLAX SEED OIL PO) Take 1 capsule by mouth daily.   ? fluticasone (FLONASE) 50 MCG/ACT nasal spray Place 1 spray into both nostrils daily as needed for allergies.   ? glucose blood (ONETOUCH VERIO) test strip Use to check fasting blood sugar daily and an occasional 2 hr postprandial.   ? Lancets (ONETOUCH ULTRASOFT) lancets Use to check fasting blood sugar daily and an occasional 2 hr postprandial   ? levothyroxine (SYNTHROID) 75 MCG tablet TAKE 1 TABLET BY MOUTH EVERY DAY   ? losartan (COZAAR) 50 MG tablet TAKE 1 TABLET BY MOUTH EVERY DAY   ? MAGNESIUM PO Take by mouth daily.   ? meloxicam (MOBIC) 7.5 MG tablet  TAKE 1 TABLET BY MOUTH EVERY DAY (Patient taking differently: Take 7.5 mg by mouth daily as needed.)   ? Multiple Vitamin (MULTIVITAMIN) tablet Take 1 tablet by mouth daily.   ? nitroGLYCERIN (NITROSTAT) 0.4 MG SL tablet Place 1 tablet (0.4 mg total) under the tongue every 5 (five) minutes as needed.   ? Skin Protectants, Misc. (EUCERIN) cream Apply 1 application topically See admin instructions. Eczema relief applies to arms and hands daily   ? triamcinolone cream (KENALOG) 0.1 % Apply 1 application topically 2 (two) times daily as needed (psoriasis).   ? Vitamin D, Cholecalciferol, 1000 units TABS Take 2,000 Units by mouth daily.   ? ?No facility-administered encounter medications on file as of 04/22/2022.  ? ?Renee [name] was contacted to remind of upcoming telephone visit with Charlene Brooke on 04/27/2022 at 11:00. Patient was reminded to have any blood glucose and blood pressure readings available for review at appointment.  ? ?Message was left reminding patient of appointment. ? ?Star Rating Drugs: ?Medication:  Last Fill: Day Supply ?Losartan 50 mg 04/05/2022 90 ?Trulicity 0.32 mg PAP ? ?Charlene Brooke, CPP notified ? ?Marijean Niemann, RMA ?Clinical Pharmacy Assistant ?[phone] ? ? ? ? ? ?

## 2022-04-27 ENCOUNTER — Telehealth: Payer: Self-pay

## 2022-04-27 ENCOUNTER — Ambulatory Visit (INDEPENDENT_AMBULATORY_CARE_PROVIDER_SITE_OTHER): Payer: Medicare Other | Admitting: Pharmacist

## 2022-04-27 DIAGNOSIS — E1169 Type 2 diabetes mellitus with other specified complication: Secondary | ICD-10-CM

## 2022-04-27 DIAGNOSIS — E1159 Type 2 diabetes mellitus with other circulatory complications: Secondary | ICD-10-CM

## 2022-04-27 DIAGNOSIS — I152 Hypertension secondary to endocrine disorders: Secondary | ICD-10-CM

## 2022-04-27 DIAGNOSIS — Z789 Other specified health status: Secondary | ICD-10-CM

## 2022-04-27 NOTE — Progress Notes (Signed)
? ? ?  Chronic Care Management ?Pharmacy Assistant  ? ?Name: Renee [name]  MRN: [id number] DOB: 11/21/1946 ? ?Reason for Encounter: CCM (Trulicity 5188 PAP) ?  ?Patient assistance forms for Trulicity through [employer] have been completed and uploaded. ? ?Charlene Brooke, CPP notified ? ?Marijean Niemann, RMA ?Clinical Pharmacy Assistant ?[phone] ? ? ?

## 2022-04-27 NOTE — Progress Notes (Signed)
? ?Chronic Care Management ?Pharmacy Note ? ?04/28/2022 ?Name:  Renee [name] MRN:  [id number] DOB:  04/28/1946 ? ?Summary: CCM F/U visit ?-Pt reports fasting BG 100-130 on Trulicity monotherapy. A1c was 6.3% 11/2021. It is time to renew Trulicity assistance program ? ?Recommendations/Changes made from today's visit: ?-Renew Trulicity PAP ([employer] forms at front desk for pt to complete) ? ?Plan: ?-[facility] will follow up on Trulicity PAP in 2 weeks ?-Pharmacist follow up televisit scheduled for 6 months ?-PCP f/u 06/04/22 ? ? ? ?Subjective: ?Renee [name] is an 75 y.o. year old female who is a primary patient of Bedsole, Amy E, MD.  The CCM team was consulted for assistance with disease management and care coordination needs.   ? ?Engaged with patient by telephone for follow up visit in response to provider referral for pharmacy case management and/or care coordination services.  ? ?Consent to Services:  ?The patient was given information about Chronic Care Management services, agreed to services, and gave verbal consent prior to initiation of services.  Please see initial visit note for detailed documentation.  ? ?Patient Care Team: ?Bedsole, Amy E, MD as PCP - General (Family Medicine) ?Aluisio, Frank, MD as Consulting Physician (Orthopedic Surgery) ?Gollan, Timothy J, MD as Consulting Physician (Cardiology) ?Dasher, David A, MD as Consulting Physician (Dermatology) ?Porfilio, William, MD as Referring Physician (Ophthalmology) ?,  N, RPH as Pharmacist (Pharmacist) ? ?Recent office visits: ?02/17/22 NP James Cable OV: hordeolum L eyelid. Start Erythromycin ointment x 5-7 days. ? ?12/01/21 Dr Bedsole OV: annual exam. Trial meloxicam for shoulder pain. Restart glipizide until sugars decrease. Schedule mammogram/DEXA. ? ?Recent consult visits: ?03/16/22 Dr Sethi (Neurology) f/u [facility], stable. LDL goal < 70.  ? ?02/25/22 Dr Schuman (OB/GYN): f/u lichen sclerosus; advised to continue ointment 1-2x  weekly to prevent recurrence. ? ?01/11/22 Dr Schuman (OB/GYN): F/u lichen sclrosus. Apply steroid ointment daily. ? ?12/11/21 Dr Gollan (cardiology): f/u CAD. Discussed PCSK9-inhibitor. For now monitor as weight loss continues. ? ?Hospital visits: ?None in previous 6 months ? ? ?Objective: ? ?Lab Results  ?Component Value Date  ? CREATININE 0.68 08/14/2021  ? BUN 21 08/14/2021  ? GFR 84.28 08/14/2021  ? GFRNONAA >60 06/17/2021  ? GFRAA >60 03/28/2018  ? NA 141 08/14/2021  ? K 4.1 08/14/2021  ? CALCIUM 9.6 08/14/2021  ? CO2 27 08/14/2021  ? GLUCOSE 115 (H) 08/14/2021  ? ? ?Lab Results  ?Component Value Date/Time  ? HGBA1C 6.3 (H) 11/27/2021 03:25 PM  ? HGBA1C 6.6 (H) 08/14/2021 11:07 AM  ? GFR 84.28 08/14/2021 11:07 AM  ? GFR 82.46 04/27/2021 10:41 AM  ?  ?Last diabetic Eye exam:  ?Lab Results  ?Component Value Date/Time  ? HMDIABEYEEXA No Retinopathy 05/20/2021 12:00 AM  ?  ?Last diabetic Foot exam:  ?Lab Results  ?Component Value Date/Time  ? HMDIABFOOTEX done 09/29/2020 12:00 AM  ?  ? ?Lab Results  ?Component Value Date  ? CHOL 146 08/14/2021  ? HDL 45.80 08/14/2021  ? LDLCALC 84 08/14/2021  ? TRIG 81.0 08/14/2021  ? CHOLHDL 3 08/14/2021  ? ? ? ?  Latest Ref Rng & Units 08/14/2021  ? 11:07 AM 06/17/2021  ?  7:08 PM 04/27/2021  ? 10:41 AM  ?Hepatic Function  ?Total Protein 6.0 - 8.3 g/dL 7.2   7.5   7.5    ?Albumin 3.5 - 5.2 g/dL 4.2   4.2   4.2    ?AST 0 - 37 U/L 21   24     20    ?ALT 0 - 35 U/L 19   18   18    ?Alk Phosphatase 39 - 117 U/L 59   56   52    ?Total Bilirubin 0.2 - 1.2 mg/dL 0.4   0.6   0.4    ? ? ?Lab Results  ?Component Value Date/Time  ? TSH 2.67 11/27/2021 03:25 PM  ? TSH 2.85 10/07/2020 12:20 PM  ? FREET4 1.3 11/27/2021 03:25 PM  ? FREET4 0.85 10/07/2020 12:20 PM  ? ? ? ?  Latest Ref Rng & Units 06/17/2021  ?  7:33 PM 06/17/2021  ?  7:08 PM 03/28/2018  ?  7:21 AM  ?CBC  ?WBC 4.0 - 10.5 K/uL  7.9   10.0    ?Hemoglobin 12.0 - 15.0 g/dL 15.6   14.4   13.2    ?Hematocrit 36.0 - 46.0 % 46.0   45.2   40.6     ?Platelets 150 - 400 K/uL  374   380    ? ? ?Lab Results  ?Component Value Date/Time  ? VD25OH 43.32 10/07/2020 12:20 PM  ? VD25OH 40.99 09/28/2019 08:54 AM  ? ? ?Clinical ASCVD: Yes  ?The 10-year ASCVD risk score (Arnett DK, et al., 2019) is: 48% ?  Values used to calculate the score: ?    Age: 75 years ?    Sex: Female ?    Is Non-Hispanic African American: No ?    Diabetic: Yes ?    Tobacco smoker: No ?    Systolic Blood Pressure: 138 mmHg ?    Is BP treated: Yes ?    HDL Cholesterol: 45.8 mg/dL ?    Total Cholesterol: 146 mg/dL   ? ? ?  12/01/2021  ?  3:17 PM 11/30/2021  ?  1:27 PM 05/29/2021  ? 11:24 AM  ?Depression screen PHQ 2/9  ?Decreased Interest 0 0 0  ?Down, Depressed, Hopeless 2 0 0  ?PHQ - 2 Score 2 0 0  ?Altered sleeping 1  0  ?Tired, decreased energy 1  1  ?Change in appetite 0  0  ?Feeling bad or failure about yourself  0  0  ?Trouble concentrating 0  0  ?Moving slowly or fidgety/restless 0  0  ?Suicidal thoughts 0  0  ?PHQ-9 Score 4  1  ?Difficult doing work/chores Somewhat difficult  Not difficult at all  ?  ? ?Social History  ? ?Tobacco Use  ?Smoking Status Never  ?Smokeless Tobacco Never  ? ?BP Readings from Last 3 Encounters:  ?03/16/22 138/70  ?02/25/22 128/70  ?02/17/22 140/62  ? ?Pulse Readings from Last 3 Encounters:  ?03/16/22 86  ?02/17/22 78  ?12/11/21 77  ? ?Wt Readings from Last 3 Encounters:  ?03/16/22 142 lb (64.4 kg)  ?02/25/22 142 lb (64.4 kg)  ?02/17/22 142 lb 3 oz (64.5 kg)  ? ?BMI Readings from Last 3 Encounters:  ?03/16/22 25.36 kg/m?  ?02/25/22 25.15 kg/m?  ?02/17/22 25.19 kg/m?  ? ? ?Assessment/Interventions: Review of patient past medical history, allergies, medications, health status, including review of consultants reports, laboratory and other test data, was performed as part of comprehensive evaluation and provision of chronic care management services.  ? ?SDOH:  (Social Determinants of Health) assessments and interventions performed: No - done Dec 2022 AWV ? ?SDOH  Screenings  ? ?Alcohol Screen: Low Risk   ? Last Alcohol Screening Score (AUDIT): 1  ?Depression (PHQ2-9): Low Risk   ? PHQ-2 Score: 4  ?Financial Resource Strain:   Low Risk   ? Difficulty of Paying Living Expenses: Not very hard  ?Food Insecurity: No Food Insecurity  ? Worried About Running Out of Food in the Last Year: Never true  ? Ran Out of Food in the Last Year: Never true  ?Housing: Low Risk   ? Last Housing Risk Score: 0  ?Physical Activity: Sufficiently Active  ? Days of Exercise per Week: 3 days  ? Minutes of Exercise per Session: 60 min  ?Social Connections: Socially Isolated  ? Frequency of Communication with Friends and Family: More than three times a week  ? Frequency of Social Gatherings with Friends and Family: Twice a week  ? Attends Religious Services: Never  ? Active Member of Clubs or Organizations: No  ? Attends Club or Organization Meetings: Never  ? Marital Status: Divorced  ?Stress: No Stress Concern Present  ? Feeling of Stress : Not at all  ?Tobacco Use: Low Risk   ? Smoking Tobacco Use: Never  ? Smokeless Tobacco Use: Never  ? Passive Exposure: Not on file  ?Transportation Needs: No Transportation Needs  ? Lack of Transportation (Medical): No  ? Lack of Transportation (Non-Medical): No  ? ? ?CCM Care Plan ? ?Allergies  ?Allergen Reactions  ? Other Rash  ?  Metal   ? Celexa [Citalopram] Other (See Comments)  ?  Throat swelling and difficulty breathing  ? Codeine Nausea And Vomiting  ? Metformin Nausea And Vomiting  ? Nickel   ? Robaxin [Methocarbamol] Nausea Only  ? Crestor [Rosuvastatin] Rash  ? Simvastatin Swelling and Rash  ? ? ?Medications Reviewed Today   ? ? Reviewed by ,  N, RPH (Pharmacist) on 04/27/22 at 1228  Med List Status: <None>  ? ?Medication Order Taking? Sig Documenting Provider Last Dose Status Informant  ?acetaminophen (TYLENOL) 500 MG tablet [id number] Yes Take 500 mg by mouth every 8 (eight) hours as needed. Taking BID [provider] Taking  Active   ?aspirin EC 81 MG tablet [id number] Yes Take by mouth daily. [provider] Taking Active Multiple Informants  ?Blood Glucose Monitoring Suppl (ONETOUCH VERIO) w/Device KIT [id number] Yes by Does not ap

## 2022-04-28 NOTE — Telephone Encounter (Addendum)
Trulicity forms printed and placed in patient documents folder in front office. Pt reports she will come 5/4 to sign the forms. ? ?It is notated on forms to return to me once complete. ?

## 2022-04-28 NOTE — Patient Instructions (Signed)
Visit Information ? ?Phone number for Pharmacist: [phone] ? ? Goals Addressed   ?None ?  ? ? ?Care Plan : [place]  ?Updates made by Charlton Haws, RPH since 04/28/2022 12:00 AM  ?  ? ?Problem: Hypertension, Hyperlipidemia, and Diabetes   ?Priority: High  ?  ? ?Long-Range Goal: Disease Management   ?Start Date: 05/11/2021  ?Expected End Date: 04/29/2023  ?This Visit's Progress: On track  ?Recent Progress: On track  ?Priority: High  ?Note:   ?Current Barriers:  ?Trulicity - cost prohibitive ? ?Pharmacist Clinical Goal(s):  ?Patient will achieve ability to self administer medications as prescribed through use of pillbox as evidenced by patient report through collaboration with PharmD and provider.  ? ?Interventions: ?1:1 collaboration with Jinny Sanders, MD regarding development and update of comprehensive plan of care as evidenced by provider attestation and co-signature ?Inter-disciplinary care team collaboration (see longitudinal plan of care) ?Comprehensive medication review performed; medication list updated in electronic medical record ? ?Hypertension (BP goal <140/90) ?-Controlled - per clinic readings ?-Current home readings: n/a ?-Current treatment: ?Losartan 50 mg daily - Appropriate, Effective, Safe, Accessible ?-Medications previously tried: none  ?-Counseled to monitor BP at home weekly or when symptomatic, with an arm cuff, document, and call if above 140/90 ?-Recommended to continue current medication  ? ?Hyperlipidemia: (LDL goal < 70) ?-Adequate control - LDL 84 (07/2021); pt reports she is taking ezetimibe, she fills it through Good[facility] at [facility] ?-Current treatment: ?Ezetimibe 10 mg daily - Appropriate, Effective, Safe, Accessible ?Aspirin 81 mg daily - Appropriate, Effective, Safe, Accessible ?Flaxseed Oil (Omega 3-6-9) daily - Appropriate, Effective, Safe, Accessible ?-Medications previously tried: Gemfibrozil - myopathy, statin intolerance ?-Recommended to continue  current medication ? ?Diabetes (A1c goal <7%) ?-Controlled -A1c 6.3% (11/2021) she has noticed improvement in BG as her pain has improved. ?-She denies any recent courses of prednisone or injections. She is having a lot less lows off glipizide. No longer feeling weak. She has really enjoyed the Trulicity. ?-Current home glucose readings - 100-130 ?-Current medications: ?Trulicity 4.26 mg once weekly (PAP) - Appropriate, Effective, Safe, Accessible ?-Medications previously tried: glipizide - stopped when she started Trulicity ?-Diet - continues to limit breads and sweets, tries to eat vegetables often ?-Pt needs to renew Trulicity PAP - printed forms and left for her at front desk ?-Recommended to continue current medication ? ?Arthritis/Shoulder Pain ?Meloxicam - once daily (7 PM) ?Tylenol 500 mg - takes 2 tablet (1000 mg) BID ?Voltaren gel - up to four time days PRN (uses up to once daily) ? ?Patient Goals/Self-Care Activities ?Patient will:  ?- check blood glucose daily and keep log  ?- check blood pressure weekly and keep log  ?- come to office to sign Trulicity forms and bring proof of income ?  ?  ? ?Patient verbalizes understanding of instructions and care plan provided today and agrees to view in [place]. Active MyChart status confirmed with patient.   ?Telephone follow up appointment with pharmacy team member scheduled for: 6 months ? ?Charlene Brooke, PharmD, BCACP ?Clinical Pharmacist ?[facility] ?[phone] ?  ?

## 2022-04-29 ENCOUNTER — Ambulatory Visit (INDEPENDENT_AMBULATORY_CARE_PROVIDER_SITE_OTHER): Payer: Medicare Other | Admitting: Podiatry

## 2022-04-29 DIAGNOSIS — Q828 Other specified congenital malformations of skin: Secondary | ICD-10-CM | POA: Diagnosis not present

## 2022-04-29 NOTE — Progress Notes (Signed)
?Subjective:  ?Patient ID: Renee [name], female    DOB: [birth year],  MRN: [id number] ? ?Chief Complaint  ?Patient presents with  ? Callouses  ? ? ?76 y.o. female presents with the above complaint.  Patient presents with complaint of bilateral fifth metatarsal hyperkeratotic lesion.  Patient stated they are still very painful but the debridement does help.  She does not have any secondary complaint.  The lesions are very painful to ambulate on.  Pain scale 6 out of 10 sharp shooting in nature ? ? ?Review of Systems: Negative except as noted in the HPI. Denies N/V/F/Ch. ? ?Past Medical History:  ?Diagnosis Date  ? Allergy   ? Anxiety   ? Arthritis   ? CAD (coronary artery disease)   ? non-obstructive. cath 11/11  ? Diabetes mellitus   ? borderline diabetic - diet controlled   ? History of chicken pox   ? History of phobia   ? clastrophobia  ? Hyperlipidemia   ? Hypertension   ? Hypothyroidism   ? Kidney stones   ? hx of   ? Lichen sclerosus   ? Psoriasis   ? Stroke [facility])   ? Urinary tract infection   ? hx of   ? ? ?Current Outpatient Medications:  ?  acetaminophen (TYLENOL) 500 MG tablet, Take 500 mg by mouth every 8 (eight) hours as needed. Taking BID, Disp: , Rfl:  ?  aspirin EC 81 MG tablet, Take by mouth daily., Disp: , Rfl:  ?  Blood Glucose Monitoring Suppl (ONETOUCH VERIO) w/Device KIT, by Does not apply route. Use to check fasting blood sugar daily and an occasional 2 hr postprandial, Disp: , Rfl:  ?  Carboxymethylcellulose Sodium (THERATEARS OP), Place 1 drop into both eyes at bedtime as needed., Disp: , Rfl:  ?  clobetasol ointment (TEMOVATE) 0.05 %, APPLY TOPICALLY DAILY AS NEEDED FOR PSORIASIS FLARES, Disp: 60 g, Rfl: 0 ?  co-enzyme Q-10 30 MG capsule, Take 200 mg daily, Disp: 1 capsule, Rfl: 1 ?  Dulaglutide (TRULICITY) 9.15 AV/6.9VX SOPN, Inject 0.75 mg into the skin once a week. Via [employer] PAP, Disp: , Rfl:  ?  Emollient (CERAVE) CREA, Apply 1 application topically daily as needed (psoriasis).,  Disp: , Rfl:  ?  erythromycin ophthalmic ointment, Place 1 application into the left eye 3 (three) times daily. For 5-7 days, Disp: 3.5 g, Rfl: 0 ?  ezetimibe (ZETIA) 10 MG tablet, Take 1 tablet (10 mg total) by mouth daily., Disp: 90 tablet, Rfl: 3 ?  famotidine (PEPCID) 20 MG tablet, Take 20 mg by mouth daily as needed for heartburn or indigestion., Disp: , Rfl:  ?  Flaxseed, Linseed, (FLAX SEED OIL PO), Take 1 capsule by mouth daily., Disp: , Rfl:  ?  fluticasone (FLONASE) 50 MCG/ACT nasal spray, Place 1 spray into both nostrils daily as needed for allergies., Disp: , Rfl:  ?  glucose blood (ONETOUCH VERIO) test strip, Use to check fasting blood sugar daily and an occasional 2 hr postprandial., Disp: 100 each, Rfl: 11 ?  Lancets (ONETOUCH ULTRASOFT) lancets, Use to check fasting blood sugar daily and an occasional 2 hr postprandial, Disp: 100 each, Rfl: 11 ?  levothyroxine (SYNTHROID) 75 MCG tablet, TAKE 1 TABLET BY MOUTH EVERY DAY, Disp: 90 tablet, Rfl: 3 ?  losartan (COZAAR) 50 MG tablet, TAKE 1 TABLET BY MOUTH EVERY DAY, Disp: 90 tablet, Rfl: 1 ?  MAGNESIUM PO, Take by mouth daily., Disp: , Rfl:  ?  meloxicam (MOBIC) 7.5  MG tablet, TAKE 1 TABLET BY MOUTH EVERY DAY (Patient taking differently: Take 7.5 mg by mouth daily as needed.), Disp: 30 tablet, Rfl: 0 ?  Multiple Vitamin (MULTIVITAMIN) tablet, Take 1 tablet by mouth daily., Disp: , Rfl:  ?  nitroGLYCERIN (NITROSTAT) 0.4 MG SL tablet, Place 1 tablet (0.4 mg total) under the tongue every 5 (five) minutes as needed., Disp: 25 tablet, Rfl: 1 ?  Skin Protectants, Misc. (EUCERIN) cream, Apply 1 application topically See admin instructions. Eczema relief applies to arms and hands daily, Disp: , Rfl:  ?  triamcinolone cream (KENALOG) 0.1 %, Apply 1 application topically 2 (two) times daily as needed (psoriasis)., Disp: 454 g, Rfl: 0 ?  Vitamin D, Cholecalciferol, 1000 units TABS, Take 2,000 Units by mouth daily., Disp: , Rfl:  ? ?Social History  ? ?Tobacco Use   ?Smoking Status Never  ?Smokeless Tobacco Never  ? ? ?Allergies  ?Allergen Reactions  ? Other Rash  ?  Metal   ? Celexa [Citalopram] Other (See Comments)  ?  Throat swelling and difficulty breathing  ? Codeine Nausea And Vomiting  ? Metformin Nausea And Vomiting  ? Nickel   ? Robaxin [Methocarbamol] Nausea Only  ? Crestor [Rosuvastatin] Rash  ? Simvastatin Swelling and Rash  ? ?Objective:  ?There were no vitals filed for this visit. ?There is no height or weight on file to calculate BMI. ?Constitutional Well developed. ?Well nourished.  ?Vascular Dorsalis pedis pulses palpable bilaterally. ?Posterior tibial pulses palpable bilaterally. ?Capillary refill normal to all digits.  ?No cyanosis or clubbing noted. ?Pedal hair growth normal.  ?Neurologic Normal speech. ?Oriented to person, place, and time. ?Epicritic sensation to light touch grossly present bilaterally.  ?Dermatologic Nail Exam: Pt has thick disfigured discolored nails with subungual debris noted bilateral entire nail hallux through fifth toenails.  Pain on palpation to the nails. ?No open wounds. ?No skin lesions.  ?Orthopedic: Bilateral submetatarsal 5 porokeratotic/benign skin lesion with central nucleated core noted.  Upon debridement no pinpoint bleeding noted.  No wounds noted.  ? ?Radiographs:   3 views of skeletally mature adult bilateral foot: Midfoot arthritis noted.  Plantar and posterior heel spurring noted.  Plantarflexed fifth metatarsal noted with mild signs of tailor's bunion.  There is increase in lateral deviation angle. ?Assessment:  ? ?No diagnosis found. ? ? ? ?Plan:  ?Patient was evaluated and treated and all questions answered. ? ?Bilateral submetatarsal 5 porokeratosis x2 ?-X-ray to the patient the etiology of porokeratosis and was treatment options were extensively discussed.  Given the amount of pain she is having I believe she will benefit from debridement of the lesion followed by excision of the lesion.  I discussed this with  the patient she states understanding like to proceed with that. ?-Using chisel blade and handle the lesion was debrided down to healthy striated tissue followed by excision of central nucleated core.  Immediate pain was noted after debridement and excision.  No pinpoint bleeding noted. ? ?Onychomycosis with pain  ?-Nails palliatively debrided as below. ?-Educated on self-care ? ?Procedure: Nail Debridement ?Rationale: pain  ?Type of Debridement: manual, sharp debridement. ?Instrumentation: Nail nipper, rotary burr. ?Number of Nails: 10 ? ?Procedures and Treatment: Consent by patient was obtained for treatment procedures. The patient understood the discussion of treatment and procedures well. All questions were answered thoroughly reviewed. Debridement of mycotic and hypertrophic toenails, 1 through 5 bilateral and clearing of subungual debris. No ulceration, no infection noted.  ?Return Visit-Office Procedure: Patient instructed to return to the  office for a follow up visit 3 months for continued evaluation and treatment. ? ?Boneta Lucks, DPM ?  ? ?No follow-ups on file. ? ? ? ? ?No follow-ups on file.  ? ?

## 2022-05-05 NOTE — Telephone Encounter (Signed)
Faxed completed forms to [employer]: [phone] ?

## 2022-05-06 NOTE — Telephone Encounter (Signed)
Received fax from [employer]. Patient has been approved for Trulicity through 98/26/41. Medication will be shipped in 1-2 weeks. ?

## 2022-05-10 ENCOUNTER — Telehealth: Payer: Self-pay | Admitting: Family Medicine

## 2022-05-10 MED ORDER — EZETIMIBE 10 MG PO TABS: 10.0000 mg | ORAL_TABLET | Freq: Every day | ORAL | 0 refills | Status: DC

## 2022-05-10 NOTE — Telephone Encounter (Signed)
Encourage patient to contact the pharmacy for refills or they can request refills through [facility] ? ?Did the patient contact the pharmacy: N/A ? ?NEXT APPOINTMENT DATE:  06/04/2022 ? ?MEDICATION:  ezetimibe (ZETIA) 10 MG tablet ? ?Is the patient out of medication? Almost ? ?If not, how much is left? 4 left ? ?Is this a 90 day supply: Yes ? ?[id number] [address], [place] ?Phone Number: [phone] ? ?Let patient know to contact pharmacy at the end of the day to make sure medication is ready. ? ?Please notify patient to allow 48-72 hours to process ?  ?

## 2022-05-10 NOTE — Telephone Encounter (Signed)
Refill sent as requested. 

## 2022-05-11 ENCOUNTER — Telehealth: Payer: Medicare Other

## 2022-05-12 DIAGNOSIS — M79644 Pain in right finger(s): Secondary | ICD-10-CM | POA: Diagnosis not present

## 2022-05-12 DIAGNOSIS — M65331 Trigger finger, right middle finger: Secondary | ICD-10-CM | POA: Diagnosis not present

## 2022-05-26 DIAGNOSIS — E119 Type 2 diabetes mellitus without complications: Secondary | ICD-10-CM | POA: Diagnosis not present

## 2022-05-26 DIAGNOSIS — Z7984 Long term (current) use of oral hypoglycemic drugs: Secondary | ICD-10-CM | POA: Diagnosis not present

## 2022-05-26 DIAGNOSIS — I10 Essential (primary) hypertension: Secondary | ICD-10-CM | POA: Diagnosis not present

## 2022-05-26 DIAGNOSIS — E785 Hyperlipidemia, unspecified: Secondary | ICD-10-CM

## 2022-05-26 DIAGNOSIS — H43813 Vitreous degeneration, bilateral: Secondary | ICD-10-CM | POA: Diagnosis not present

## 2022-05-26 DIAGNOSIS — E1169 Type 2 diabetes mellitus with other specified complication: Secondary | ICD-10-CM | POA: Diagnosis not present

## 2022-05-26 DIAGNOSIS — M13819 Other specified arthritis, unspecified shoulder: Secondary | ICD-10-CM

## 2022-05-26 DIAGNOSIS — H0014 Chalazion left upper eyelid: Secondary | ICD-10-CM | POA: Diagnosis not present

## 2022-05-26 LAB — HM DIABETES EYE EXAM

## 2022-05-31 ENCOUNTER — Telehealth: Payer: Self-pay | Admitting: Family Medicine

## 2022-05-31 ENCOUNTER — Other Ambulatory Visit (INDEPENDENT_AMBULATORY_CARE_PROVIDER_SITE_OTHER): Payer: Medicare Other

## 2022-05-31 DIAGNOSIS — E039 Hypothyroidism, unspecified: Secondary | ICD-10-CM

## 2022-05-31 DIAGNOSIS — M109 Gout, unspecified: Secondary | ICD-10-CM | POA: Diagnosis not present

## 2022-05-31 DIAGNOSIS — E1159 Type 2 diabetes mellitus with other circulatory complications: Secondary | ICD-10-CM

## 2022-05-31 DIAGNOSIS — E559 Vitamin D deficiency, unspecified: Secondary | ICD-10-CM

## 2022-05-31 LAB — COMPREHENSIVE METABOLIC PANEL
ALT: 14 U/L (ref 0–35)
AST: 18 U/L (ref 0–37)
Albumin: 4.3 g/dL (ref 3.5–5.2)
Alkaline Phosphatase: 55 U/L (ref 39–117)
BUN: 25 mg/dL — ABNORMAL HIGH (ref 6–23)
CO2: 28 mEq/L (ref 19–32)
Calcium: 9.7 mg/dL (ref 8.4–10.5)
Chloride: 104 mEq/L (ref 96–112)
Creatinine, Ser: 0.68 mg/dL (ref 0.40–1.20)
GFR: 83.82 mL/min (ref >60.00)
Glucose, Bld: 105 mg/dL — ABNORMAL HIGH (ref 70–99)
Potassium: 4.4 mEq/L (ref 3.5–5.1)
Sodium: 141 mEq/L (ref 135–145)
Total Bilirubin: 0.3 mg/dL (ref 0.2–1.2)
Total Protein: 7.2 g/dL (ref 6.0–8.3)

## 2022-05-31 LAB — LIPID PANEL
Cholesterol: 184 mg/dL (ref 0–200)
HDL: 44.7 mg/dL (ref >39.00)
LDL Cholesterol: 105 mg/dL — ABNORMAL HIGH (ref 0–99)
NonHDL: 138.81
Total CHOL/HDL Ratio: 4
Triglycerides: 169 mg/dL — ABNORMAL HIGH (ref 0.0–149.0)
VLDL: 33.8 mg/dL (ref 0.0–40.0)

## 2022-05-31 LAB — T4, FREE: Free T4: 0.95 ng/dL (ref 0.60–1.60)

## 2022-05-31 LAB — VITAMIN D 25 HYDROXY (VIT D DEFICIENCY, FRACTURES): VITD: 45.47 ng/mL (ref 30.00–100.00)

## 2022-05-31 LAB — HM DIABETES FOOT EXAM

## 2022-05-31 LAB — T3, FREE: T3, Free: 3 pg/mL (ref 2.3–4.2)

## 2022-05-31 LAB — HEMOGLOBIN A1C: Hgb A1c MFr Bld: 6.5 % (ref 4.6–6.5)

## 2022-05-31 LAB — TSH: TSH: 2.49 u[IU]/mL (ref 0.35–5.50)

## 2022-05-31 LAB — URIC ACID: Uric Acid, Serum: 5.9 mg/dL (ref 2.4–7.0)

## 2022-05-31 NOTE — Progress Notes (Signed)
No critical labs need to be addressed urgently. We will discuss labs in detail at upcoming office visit.   

## 2022-05-31 NOTE — Telephone Encounter (Signed)
-----   Message from Ellamae Sia sent at 05/19/2022 11:37 AM EDT ----- Regarding: Lab orders for Monday, 6.5.23 Lab orders for a 6 month follow up appt

## 2022-06-04 ENCOUNTER — Encounter: Payer: Self-pay | Admitting: Family Medicine

## 2022-06-04 ENCOUNTER — Ambulatory Visit (INDEPENDENT_AMBULATORY_CARE_PROVIDER_SITE_OTHER): Payer: Medicare Other | Admitting: Family Medicine

## 2022-06-04 VITALS — BP 120/70 | HR 86 | Temp 97.3°F | Ht 62.75 in | Wt 140.0 lb

## 2022-06-04 DIAGNOSIS — G720 Drug-induced myopathy: Secondary | ICD-10-CM

## 2022-06-04 DIAGNOSIS — E1169 Type 2 diabetes mellitus with other specified complication: Secondary | ICD-10-CM | POA: Diagnosis not present

## 2022-06-04 DIAGNOSIS — M109 Gout, unspecified: Secondary | ICD-10-CM | POA: Diagnosis not present

## 2022-06-04 DIAGNOSIS — E785 Hyperlipidemia, unspecified: Secondary | ICD-10-CM | POA: Diagnosis not present

## 2022-06-04 DIAGNOSIS — I10 Essential (primary) hypertension: Secondary | ICD-10-CM

## 2022-06-04 DIAGNOSIS — E039 Hypothyroidism, unspecified: Secondary | ICD-10-CM

## 2022-06-04 DIAGNOSIS — T466X5A Adverse effect of antihyperlipidemic and antiarteriosclerotic drugs, initial encounter: Secondary | ICD-10-CM | POA: Diagnosis not present

## 2022-06-04 DIAGNOSIS — E1159 Type 2 diabetes mellitus with other circulatory complications: Secondary | ICD-10-CM | POA: Diagnosis not present

## 2022-06-04 DIAGNOSIS — E559 Vitamin D deficiency, unspecified: Secondary | ICD-10-CM

## 2022-06-04 DIAGNOSIS — I152 Hypertension secondary to endocrine disorders: Secondary | ICD-10-CM

## 2022-06-04 NOTE — Assessment & Plan Note (Signed)
Stable, chronic.  Continue current medication.    levothyroxine 75 mcg daily 

## 2022-06-04 NOTE — Assessment & Plan Note (Signed)
Chronic, associated with hypertension Well-controlled on Trulicity 0.75 mg weekly.  No side effects    

## 2022-06-04 NOTE — Patient Instructions (Addendum)
Work on low cholesterol diet.Marland Kitchen decrease bacon grease and bacon.  Okay to call for 3 month lab only cholesterol check if you are working hard on it.

## 2022-06-04 NOTE — Assessment & Plan Note (Signed)
No flares.  Uric acid at goal.

## 2022-06-04 NOTE — Assessment & Plan Note (Signed)
Chronic, worsening control in the last 3 to 6 months.   She continues Zetia 10 mg p.o. daily.  She is intolerant of statins given myopathy. 

## 2022-06-04 NOTE — Assessment & Plan Note (Signed)
Stable, chronic.  Continue current medication.  losartan 50 mg daily   

## 2022-06-04 NOTE — Progress Notes (Signed)
Patient ID: Renee [name], female    DOB: 10-Oct-1946, 76 y.o.   MRN: [id number]  This visit was conducted in person.  BP 120/70 (BP Location: Left Arm, Patient Position: Sitting, Cuff Size: Normal)   Pulse 86   Temp (!) 97.3 F (36.3 C) (Temporal)   Ht 5' 2.75" (1.594 m)   Wt 140 lb (63.5 kg)   SpO2 96%   BMI 25.00 kg/m    CC:  Chief Complaint  Patient presents with   Diabetes    Subjective:   HPI: Renee [name] is a 76 y.o. female presenting on 06/04/2022 for Diabetes   She has stye in left eye.. improved in 01/2022 with antibiotic ointment and warm compresses, now in last month it flared up.  Saw eye MD 05/26/2022.. started on  antibiotic eye drop  Diabetes:   Excellent control on Trulicity 6.57 mg weekly Lab Results  Component Value Date   HGBA1C 6.5 05/31/2022  Using medications without difficulties: Hypoglycemic episodes:none Hyperglycemic episodes: none Feet problems: no ulcers Blood Sugars averaging: 110- 138 eye exam within last year: yes Wt Readings from Last 3 Encounters:  06/04/22 140 lb (63.5 kg)  03/16/22 142 lb (64.4 kg)  02/25/22 142 lb (64.4 kg)     Hypertension:   At goal on  losartan 50 mg daily BP Readings from Last 3 Encounters:  06/04/22 120/70  03/16/22 138/70  02/25/22 128/70  Using medication without problems or lightheadedness:  none Chest pain with exertion: none Edema: none Short of breath: none Average home BPs: average 120/70 Other issues:  Hypothyroidism:   on levo 75 mcg daily Lab Results  Component Value Date   TSH 2.49 05/31/2022      Elevated Cholesterol:   On zetia LDL  not at goal < 70 ( hx of CAD)  Intolerant of statins.Marland Kitchen statin induced myopathy Lab Results  Component Value Date   CHOL 184 05/31/2022   HDL 44.70 05/31/2022   LDLCALC 105 (H) 05/31/2022   TRIG 169.0 (H) 05/31/2022   CHOLHDL 4 05/31/2022  Using medications without problems: Muscle aches:  Diet compliance: low carb diet. Exercise:  walking   several times a week Other complaints:      Relevant past medical, surgical, family and social history reviewed and updated as indicated. Interim medical history since our last visit reviewed. Allergies and medications reviewed and updated. Outpatient Medications Prior to Visit  Medication Sig Dispense Refill   acetaminophen (TYLENOL) 500 MG tablet Take 500 mg by mouth every 8 (eight) hours as needed. Taking BID     aspirin EC 81 MG tablet Take by mouth daily.     Blood Glucose Monitoring Suppl (ONETOUCH VERIO) w/Device KIT by Does not apply route. Use to check fasting blood sugar daily and an occasional 2 hr postprandial     Carboxymethylcellulose Sodium (THERATEARS OP) Place 1 drop into both eyes at bedtime as needed.     clobetasol ointment (TEMOVATE) 0.05 % APPLY TOPICALLY DAILY AS NEEDED FOR PSORIASIS FLARES 60 g 0   co-enzyme Q-10 30 MG capsule Take 200 mg daily 1 capsule 1   Dulaglutide (TRULICITY) 9.03 YB/3.3OV SOPN Inject 0.75 mg into the skin once a week. Via [employer] Cares PAP     Emollient (CERAVE) CREA Apply 1 application topically daily as needed (psoriasis).     erythromycin ophthalmic ointment Place 1 application into the left eye 3 (three) times daily. For 5-7 days 3.5 g 0   ezetimibe (ZETIA) 10 MG  tablet Take 1 tablet (10 mg total) by mouth daily. 90 tablet 0   famotidine (PEPCID) 20 MG tablet Take 20 mg by mouth daily as needed for heartburn or indigestion.     Flaxseed, Linseed, (FLAX SEED OIL PO) Take 1 capsule by mouth daily.     fluticasone (FLONASE) 50 MCG/ACT nasal spray Place 1 spray into both nostrils daily as needed for allergies.     glucose blood (ONETOUCH VERIO) test strip Use to check fasting blood sugar daily and an occasional 2 hr postprandial. 100 each 11   Lancets (ONETOUCH ULTRASOFT) lancets Use to check fasting blood sugar daily and an occasional 2 hr postprandial 100 each 11   levothyroxine (SYNTHROID) 75 MCG tablet TAKE 1 TABLET BY MOUTH EVERY DAY 90  tablet 3   losartan (COZAAR) 50 MG tablet TAKE 1 TABLET BY MOUTH EVERY DAY 90 tablet 1   MAGNESIUM PO Take by mouth daily.     meloxicam (MOBIC) 7.5 MG tablet TAKE 1 TABLET BY MOUTH EVERY DAY (Patient taking differently: Take 7.5 mg by mouth daily as needed.) 30 tablet 0   Multiple Vitamin (MULTIVITAMIN) tablet Take 1 tablet by mouth daily.     nitroGLYCERIN (NITROSTAT) 0.4 MG SL tablet Place 1 tablet (0.4 mg total) under the tongue every 5 (five) minutes as needed. 25 tablet 1   Skin Protectants, Misc. (EUCERIN) cream Apply 1 application topically See admin instructions. Eczema relief applies to arms and hands daily     triamcinolone cream (KENALOG) 0.1 % Apply 1 application topically 2 (two) times daily as needed (psoriasis). 454 g 0   Vitamin D, Cholecalciferol, 1000 units TABS Take 2,000 Units by mouth daily.     No facility-administered medications prior to visit.     Per HPI unless specifically indicated in ROS section below Review of Systems  Constitutional:  Negative for fatigue and fever.  HENT:  Negative for congestion.   Eyes:  Negative for pain.  Respiratory:  Negative for cough and shortness of breath.   Cardiovascular:  Negative for chest pain, palpitations and leg swelling.  Gastrointestinal:  Negative for abdominal pain.  Genitourinary:  Negative for dysuria and vaginal bleeding.  Musculoskeletal:  Negative for back pain.  Neurological:  Negative for syncope, light-headedness and headaches.  Psychiatric/Behavioral:  Negative for dysphoric mood.    Objective:  BP 120/70 (BP Location: Left Arm, Patient Position: Sitting, Cuff Size: Normal)   Pulse 86   Temp (!) 97.3 F (36.3 C) (Temporal)   Ht 5' 2.75" (1.594 m)   Wt 140 lb (63.5 kg)   SpO2 96%   BMI 25.00 kg/m   Wt Readings from Last 3 Encounters:  06/04/22 140 lb (63.5 kg)  03/16/22 142 lb (64.4 kg)  02/25/22 142 lb (64.4 kg)      Physical Exam Vitals and nursing note reviewed.  Constitutional:       General: She is not in acute distress.    Appearance: Normal appearance. She is well-developed. She is not ill-appearing or toxic-appearing.  HENT:     Head: Normocephalic.     Right Ear: Hearing, tympanic membrane, ear canal and external ear normal.     Left Ear: Hearing, tympanic membrane, ear canal and external ear normal.     Nose: Nose normal.  Eyes:     General: Lids are normal. Lids are everted, no foreign bodies appreciated.     Conjunctiva/sclera: Conjunctivae normal.     Pupils: Pupils are equal, round, and reactive to light.  Neck:     Thyroid: No thyroid mass or thyromegaly.     Vascular: No carotid bruit.     Trachea: Trachea normal.  Cardiovascular:     Rate and Rhythm: Normal rate and regular rhythm.     Heart sounds: Normal heart sounds, S1 normal and S2 normal. No murmur heard.    No gallop.  Pulmonary:     Effort: Pulmonary effort is normal. No respiratory distress.     Breath sounds: Normal breath sounds. No wheezing, rhonchi or rales.  Abdominal:     General: Bowel sounds are normal. There is no distension or abdominal bruit.     Palpations: Abdomen is soft. There is no fluid wave or mass.     Tenderness: There is no abdominal tenderness. There is no guarding or rebound.     Hernia: No hernia is present.  Musculoskeletal:     Cervical back: Normal range of motion and neck supple.  Lymphadenopathy:     Cervical: No cervical adenopathy.  Skin:    General: Skin is warm and dry.     Findings: No rash.  Neurological:     Mental Status: She is alert.     Cranial Nerves: No cranial nerve deficit.     Sensory: No sensory deficit.  Psychiatric:        Mood and Affect: Mood is not anxious or depressed.        Speech: Speech normal.        Behavior: Behavior normal. Behavior is cooperative.        Judgment: Judgment normal.   Diabetic foot exam: Normal inspection No skin breakdown No calluses  Normal DP pulses Normal sensation to light touch and  monofilament Nails normal     Results for orders placed or performed in visit on 05/31/22  Uric acid  Result Value Ref Range   Uric Acid, Serum 5.9 2.4 - 7.0 mg/dL  T3, free  Result Value Ref Range   T3, Free 3.0 2.3 - 4.2 pg/mL  T4, free  Result Value Ref Range   Free T4 0.95 0.60 - 1.60 ng/dL  TSH  Result Value Ref Range   TSH 2.49 0.35 - 5.50 uIU/mL  VITAMIN D 25 Hydroxy (Vit-D Deficiency, Fractures)  Result Value Ref Range   VITD 45.47 30.00 - 100.00 ng/mL  Comprehensive metabolic panel  Result Value Ref Range   Sodium 141 135 - 145 mEq/L   Potassium 4.4 3.5 - 5.1 mEq/L   Chloride 104 96 - 112 mEq/L   CO2 28 19 - 32 mEq/L   Glucose, Bld 105 (H) 70 - 99 mg/dL   BUN 25 (H) 6 - 23 mg/dL   Creatinine, Ser 0.68 0.40 - 1.20 mg/dL   Total Bilirubin 0.3 0.2 - 1.2 mg/dL   Alkaline Phosphatase 55 39 - 117 U/L   AST 18 0 - 37 U/L   ALT 14 0 - 35 U/L   Total Protein 7.2 6.0 - 8.3 g/dL   Albumin 4.3 3.5 - 5.2 g/dL   GFR 83.82 >60.00 mL/min   Calcium 9.7 8.4 - 10.5 mg/dL  Lipid panel  Result Value Ref Range   Cholesterol 184 0 - 200 mg/dL   Triglycerides 169.0 (H) 0.0 - 149.0 mg/dL   HDL 44.70 >39.00 mg/dL   VLDL 33.8 0.0 - 40.0 mg/dL   LDL Cholesterol 105 (H) 0 - 99 mg/dL   Total CHOL/HDL Ratio 4    NonHDL 138.81   Hemoglobin A1c  Result Value  Ref Range   Hgb A1c MFr Bld 6.5 4.6 - 6.5 %     COVID 19 screen:  No recent travel or known exposure to COVID19 The patient denies respiratory symptoms of COVID 19 at this time. The importance of social distancing was discussed today.   Assessment and Plan    Problem List Items Addressed This Visit     Acute gout involving toe of right foot     No flares.  Uric acid at goal.      Adult hypothyroidism    Stable, chronic.  Continue current medication.    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  No side effects      Hyperlipidemia associated with type 2 diabetes mellitus (HCC)    Chronic, worsening control in the last 3 to 6 months.  She continues Zetia 10 mg p.o. daily.  She is intolerant of statins given myopathy.      Hypertension associated with diabetes ([place]) - Primary    Stable, chronic.  Continue current medication.    losartan 50 mg daily      Statin myopathy   Vitamin D deficiency    Chronic, resolved on supplementation.        Eliezer Lofts, MD

## 2022-06-04 NOTE — Assessment & Plan Note (Signed)
Chronic, resolved on supplementation. 

## 2022-06-15 NOTE — Telephone Encounter (Signed)
Sign please

## 2022-06-16 DIAGNOSIS — M65331 Trigger finger, right middle finger: Secondary | ICD-10-CM | POA: Diagnosis not present

## 2022-06-17 ENCOUNTER — Encounter: Payer: Self-pay | Admitting: Family Medicine

## 2022-06-29 ENCOUNTER — Other Ambulatory Visit: Payer: Self-pay | Admitting: Family Medicine

## 2022-06-29 DIAGNOSIS — E1159 Type 2 diabetes mellitus with other circulatory complications: Secondary | ICD-10-CM

## 2022-06-30 ENCOUNTER — Telehealth: Payer: Self-pay | Admitting: Neurology

## 2022-06-30 NOTE — Telephone Encounter (Signed)
LVM and sent mychart msg informing pt of r/s needed for 9/21 appt- MD out.

## 2022-07-01 DIAGNOSIS — M25641 Stiffness of right hand, not elsewhere classified: Secondary | ICD-10-CM | POA: Diagnosis not present

## 2022-07-14 DIAGNOSIS — M25511 Pain in right shoulder: Secondary | ICD-10-CM | POA: Diagnosis not present

## 2022-07-14 DIAGNOSIS — M19011 Primary osteoarthritis, right shoulder: Secondary | ICD-10-CM | POA: Diagnosis not present

## 2022-07-15 ENCOUNTER — Telehealth: Payer: Self-pay | Admitting: Family Medicine

## 2022-07-15 NOTE — Telephone Encounter (Signed)
Pt called in stated paperwork was sent to over from Dr Georgiann Cocker surgeon . Pt will behaving surgeries on shoulder . Please advise [phone]

## 2022-07-15 NOTE — Telephone Encounter (Signed)
No paperwork from Dr. Amedeo Plenty has been received yet.  I know they are behind getting faxes sent out to [facility].  Will keep eye out for.  FYI to Dr. Diona Browner.

## 2022-07-16 NOTE — Telephone Encounter (Signed)
Patient called in asking if we have received the paperwork, advised message from Butch Penny that we havent gotten it.

## 2022-07-21 ENCOUNTER — Telehealth: Payer: Self-pay | Admitting: Family Medicine

## 2022-07-21 NOTE — Telephone Encounter (Signed)
Surgical Clearance form received and placed in Dr. Rometta Emery office in box to complete.  Last office visit 06/04/22.

## 2022-07-21 NOTE — Telephone Encounter (Signed)
I do not see that we have received any surgical clearance forms from Dr. Susie Cassette office.  I have called and left a message with Claiborne Billings [phone])  at [facility] asking that she fax [place] the paperwork to [phone].  FYI to Dr. Diona Browner.

## 2022-07-21 NOTE — Telephone Encounter (Signed)
Patient called and stated she needs paperwork faxed saying she is okay so she can have her shoulder surgery with Dr. Onnie Graham. Call back number [phone].

## 2022-07-22 NOTE — Telephone Encounter (Signed)
No appts available, pt added to waitlist. If pt calls back please inform her of our wait list

## 2022-07-22 NOTE — Telephone Encounter (Signed)
Pt's daughter, Adelfa Koh (on [place]) asking if there is a sooner appt than December to see Dr. Leonie Man. Would like a call from the nurse to discuss an appt.

## 2022-07-23 NOTE — Telephone Encounter (Signed)
Surgical clearance form completed and in Don's box to fax to surgical office.  I do recommend cardiology clearance given history of coronary artery disease.

## 2022-07-26 NOTE — Telephone Encounter (Signed)
Surgical Clearance form faxed to [facility] at [phone].

## 2022-08-02 ENCOUNTER — Telehealth: Payer: Self-pay

## 2022-08-02 NOTE — Progress Notes (Addendum)
Chronic Care Management Pharmacy Assistant   Name: Renee [name]  MRN: [id number] DOB: Oct 03, 1946  Reason for Encounter: CCM (Diabetes Disease State)   Recent office visits:  06/04/22 Eliezer Lofts, MD HTN Ordered: DM Foot Exam No med changes FU 3 months  Recent consult visits:  Orthopedic follow up - no information provided  Hospital visits:  None in previous 6 months  Medications: Outpatient Encounter Medications as of 08/02/2022  Medication Sig Note   acetaminophen (TYLENOL) 500 MG tablet Take 500 mg by mouth every 8 (eight) hours as needed. Taking BID    aspirin EC 81 MG tablet Take by mouth daily.    Blood Glucose Monitoring Suppl (ONETOUCH VERIO) w/Device KIT by Does not apply route. Use to check fasting blood sugar daily and an occasional 2 hr postprandial    Carboxymethylcellulose Sodium (THERATEARS OP) Place 1 drop into both eyes at bedtime as needed.    clobetasol ointment (TEMOVATE) 0.05 % APPLY TOPICALLY DAILY AS NEEDED FOR PSORIASIS FLARES    co-enzyme Q-10 30 MG capsule Take 200 mg daily    Dulaglutide (TRULICITY) 9.47 SJ/6.2EZ SOPN Inject 0.75 mg into the skin once a week. Via [employer] Cares PAP    Emollient (CERAVE) CREA Apply 1 application topically daily as needed (psoriasis). 11/17/2016: Mix with triamcinolone   erythromycin ophthalmic ointment Place 1 application into the left eye 3 (three) times daily. For 5-7 days    ezetimibe (ZETIA) 10 MG tablet Take 1 tablet (10 mg total) by mouth daily.    famotidine (PEPCID) 20 MG tablet Take 20 mg by mouth daily as needed for heartburn or indigestion.    Flaxseed, Linseed, (FLAX SEED OIL PO) Take 1 capsule by mouth daily.    fluticasone (FLONASE) 50 MCG/ACT nasal spray Place 1 spray into both nostrils daily as needed for allergies.    Lancets (ONETOUCH ULTRASOFT) lancets Use to check fasting blood sugar daily and an occasional 2 hr postprandial    levothyroxine (SYNTHROID) 75 MCG tablet TAKE 1 TABLET BY MOUTH EVERY DAY     losartan (COZAAR) 50 MG tablet TAKE 1 TABLET BY MOUTH EVERY DAY    MAGNESIUM PO Take by mouth daily.    meloxicam (MOBIC) 7.5 MG tablet TAKE 1 TABLET BY MOUTH EVERY DAY (Patient taking differently: Take 7.5 mg by mouth daily as needed.)    Multiple Vitamin (MULTIVITAMIN) tablet Take 1 tablet by mouth daily.    neomycin-polymyxin b-dexamethasone (MAXITROL) 3.5-10000-0.1 SUSP Place 1 drop into the left eye 3 (three) times daily.    nitroGLYCERIN (NITROSTAT) 0.4 MG SL tablet Place 1 tablet (0.4 mg total) under the tongue every 5 (five) minutes as needed.    ONETOUCH VERIO test strip USE TO CHECK FASTING BLOOD SUGAR DAILY AND AN OCCASIONAL 2 HR POSTPRANDIAL.    Skin Protectants, Misc. (EUCERIN) cream Apply 1 application topically See admin instructions. Eczema relief applies to arms and hands daily    triamcinolone cream (KENALOG) 0.1 % Apply 1 application topically 2 (two) times daily as needed (psoriasis).    Vitamin D, Cholecalciferol, 1000 units TABS Take 2,000 Units by mouth daily.    No facility-administered encounter medications on file as of 08/02/2022.   Recent Relevant Labs: Lab Results  Component Value Date/Time   HGBA1C 6.5 05/31/2022 11:54 AM   HGBA1C 6.3 (H) 11/27/2021 03:25 PM    Kidney Function Lab Results  Component Value Date/Time   CREATININE 0.68 05/31/2022 11:54 AM   CREATININE 0.68 08/14/2021 11:07 AM  GFR 83.82 05/31/2022 11:54 AM   GFRNONAA >60 06/17/2021 07:08 PM   GFRAA >60 03/28/2018 07:21 AM   Contacted patient on 08/09/22 to discuss diabetes disease state.   Current antihyperglycemic regimen:  Trulicity 3.42 mg once weekly (PAP)   Patient verbally confirms she is taking the above medications as directed. Yes  What recent interventions/DTPs have been made to improve glycemic control:  Recommended to continue current medication  Have there been any recent hospitalizations or ED visits since last visit with CPP? No  Patient denies hypoglycemic symptoms,  including Pale, Sweaty, Shaky, Hungry, Nervous/irritable, and Vision changes  Patient denies hyperglycemic symptoms, including blurry vision, excessive thirst, fatigue, polyuria, and weakness  How often are you checking your blood sugar? Patient has not been checking her blood glucose very often. Patient checked it on 08/08/22 and her non-fasting reading was 128. I have asked patient to take their blood glucose daily and keep a log. Advised patient I would call back on 08/13/22 for log. Patient verbalized understanding and agreed.   During the week, how often does your blood glucose drop below 70? Never  Are you checking your feet daily/regularly? Yes  Adherence Review: Is the patient currently on a STATIN medication? No Is the patient currently on ACE/ARB medication? Yes Does the patient have >5 day gap between last estimated fill dates? No  Care Gaps: Annual wellness visit in last year? Yes 11/30/2021 Most recent A1C reading: 6.5 on 05/31/2022 Most Recent BP reading: 120/70 on 06/04/2022  Last eye exam / retinopathy screening: Up to date Last diabetic foot exam: Up to date  Star Rating Drugs:  Medication:  Last Fill: [facility] Trulicity 8.76 mg PAP Losartan 50 mg 07/05/22 90     CCM appointment on 10/28/22  Charlene Brooke, CPP notified  Marijean Niemann, [place] Assistant [phone]

## 2022-08-03 ENCOUNTER — Ambulatory Visit (INDEPENDENT_AMBULATORY_CARE_PROVIDER_SITE_OTHER): Payer: Medicare Other | Admitting: Podiatry

## 2022-08-03 DIAGNOSIS — Q828 Other specified congenital malformations of skin: Secondary | ICD-10-CM | POA: Diagnosis not present

## 2022-08-03 NOTE — Progress Notes (Signed)
Subjective:  Patient ID: Renee [name], female    DOB: [birth year],  MRN: [id number]  Chief Complaint  Patient presents with   Callouses    76 y.o. female presents with the above complaint.  Patient presents with complaint of bilateral fifth metatarsal hyperkeratotic lesion.  Patient stated they are still very painful but the debridement does help.  She does not have any secondary complaint.  The lesions are very painful to ambulate on.  Pain scale 6 out of 10 sharp shooting in nature   Review of Systems: Negative except as noted in the HPI. Denies N/V/F/Ch.  Past Medical History:  Diagnosis Date   Allergy    Anxiety    Arthritis    CAD (coronary artery disease)    non-obstructive. cath 11/11   Diabetes mellitus    borderline diabetic - diet controlled    History of chicken pox    History of phobia    clastrophobia   Hyperlipidemia    Hypertension    Hypothyroidism    Kidney stones    hx of    Lichen sclerosus    Psoriasis    Stroke ([place])    Urinary tract infection    hx of     Current Outpatient Medications:    acetaminophen (TYLENOL) 500 MG tablet, Take 500 mg by mouth every 8 (eight) hours as needed. Taking BID, Disp: , Rfl:    aspirin EC 81 MG tablet, Take by mouth daily., Disp: , Rfl:    Blood Glucose Monitoring Suppl (ONETOUCH VERIO) w/Device KIT, by Does not apply route. Use to check fasting blood sugar daily and an occasional 2 hr postprandial, Disp: , Rfl:    Carboxymethylcellulose Sodium (THERATEARS OP), Place 1 drop into both eyes at bedtime as needed., Disp: , Rfl:    clobetasol ointment (TEMOVATE) 0.05 %, APPLY TOPICALLY DAILY AS NEEDED FOR PSORIASIS FLARES, Disp: 60 g, Rfl: 0   co-enzyme Q-10 30 MG capsule, Take 200 mg daily, Disp: 1 capsule, Rfl: 1   Dulaglutide (TRULICITY) 3.79 KW/4.0XB SOPN, Inject 0.75 mg into the skin once a week. Via [employer] PAP, Disp: , Rfl:    Emollient (CERAVE) CREA, Apply 1 application topically daily as needed (psoriasis).,  Disp: , Rfl:    erythromycin ophthalmic ointment, Place 1 application into the left eye 3 (three) times daily. For 5-7 days, Disp: 3.5 g, Rfl: 0   ezetimibe (ZETIA) 10 MG tablet, Take 1 tablet (10 mg total) by mouth daily., Disp: 90 tablet, Rfl: 0   famotidine (PEPCID) 20 MG tablet, Take 20 mg by mouth daily as needed for heartburn or indigestion., Disp: , Rfl:    Flaxseed, Linseed, (FLAX SEED OIL PO), Take 1 capsule by mouth daily., Disp: , Rfl:    fluticasone (FLONASE) 50 MCG/ACT nasal spray, Place 1 spray into both nostrils daily as needed for allergies., Disp: , Rfl:    Lancets (ONETOUCH ULTRASOFT) lancets, Use to check fasting blood sugar daily and an occasional 2 hr postprandial, Disp: 100 each, Rfl: 11   levothyroxine (SYNTHROID) 75 MCG tablet, TAKE 1 TABLET BY MOUTH EVERY DAY, Disp: 90 tablet, Rfl: 3   losartan (COZAAR) 50 MG tablet, TAKE 1 TABLET BY MOUTH EVERY DAY, Disp: 90 tablet, Rfl: 1   MAGNESIUM PO, Take by mouth daily., Disp: , Rfl:    meloxicam (MOBIC) 7.5 MG tablet, TAKE 1 TABLET BY MOUTH EVERY DAY (Patient taking differently: Take 7.5 mg by mouth daily as needed.), Disp: 30 tablet, Rfl: 0  Multiple Vitamin (MULTIVITAMIN) tablet, Take 1 tablet by mouth daily., Disp: , Rfl:    neomycin-polymyxin b-dexamethasone (MAXITROL) 3.5-10000-0.1 SUSP, Place 1 drop into the left eye 3 (three) times daily., Disp: , Rfl:    nitroGLYCERIN (NITROSTAT) 0.4 MG SL tablet, Place 1 tablet (0.4 mg total) under the tongue every 5 (five) minutes as needed., Disp: 25 tablet, Rfl: 1   ONETOUCH VERIO test strip, USE TO CHECK FASTING BLOOD SUGAR DAILY AND AN OCCASIONAL 2 HR POSTPRANDIAL., Disp: 100 strip, Rfl: 11   Skin Protectants, Misc. (EUCERIN) cream, Apply 1 application topically See admin instructions. Eczema relief applies to arms and hands daily, Disp: , Rfl:    triamcinolone cream (KENALOG) 0.1 %, Apply 1 application topically 2 (two) times daily as needed (psoriasis)., Disp: 454 g, Rfl: 0   Vitamin  D, Cholecalciferol, 1000 units TABS, Take 2,000 Units by mouth daily., Disp: , Rfl:   Social History   Tobacco Use  Smoking Status Never  Smokeless Tobacco Never    Allergies  Allergen Reactions   Other Rash    Metal    Celexa [Citalopram] Other (See Comments)    Throat swelling and difficulty breathing   Codeine Nausea And Vomiting   Metformin Nausea And Vomiting   Nickel    Robaxin [Methocarbamol] Nausea Only   Crestor [Rosuvastatin] Rash   Simvastatin Swelling and Rash   Objective:  There were no vitals filed for this visit. There is no height or weight on file to calculate BMI. Constitutional Well developed. Well nourished.  Vascular Dorsalis pedis pulses palpable bilaterally. Posterior tibial pulses palpable bilaterally. Capillary refill normal to all digits.  No cyanosis or clubbing noted. Pedal hair growth normal.  Neurologic Normal speech. Oriented to person, place, and time. Epicritic sensation to light touch grossly present bilaterally.  Dermatologic Nail Exam: Pt has thick disfigured discolored nails with subungual debris noted bilateral entire nail hallux through fifth toenails.  Pain on palpation to the nails. No open wounds. No skin lesions.  Orthopedic: Bilateral submetatarsal 5 porokeratotic/benign skin lesion with central nucleated core noted.  Upon debridement no pinpoint bleeding noted.  No wounds noted.   Radiographs:   3 views of skeletally mature adult bilateral foot: Midfoot arthritis noted.  Plantar and posterior heel spurring noted.  Plantarflexed fifth metatarsal noted with mild signs of tailor's bunion.  There is increase in lateral deviation angle. Assessment:   No diagnosis found.    Plan:  Patient was evaluated and treated and all questions answered.  Bilateral submetatarsal 5 porokeratosis x2 -X-ray to the patient the etiology of porokeratosis and was treatment options were extensively discussed.  Given the amount of pain she is having  I believe she will benefit from debridement of the lesion followed by excision of the lesion.  I discussed this with the patient she states understanding like to proceed with that. -Using chisel blade and handle the lesion was debrided down to healthy striated tissue followed by excision of central nucleated core.  Immediate pain was noted after debridement and excision.  No pinpoint bleeding noted.  Onychomycosis with pain  -Nails palliatively debrided as below. -Educated on self-care  Procedure: Nail Debridement Rationale: pain  Type of Debridement: manual, sharp debridement. Instrumentation: Nail nipper, rotary burr. Number of Nails: 10  Procedures and Treatment: Consent by patient was obtained for treatment procedures. The patient understood the discussion of treatment and procedures well. All questions were answered thoroughly reviewed. Debridement of mycotic and hypertrophic toenails, 1 through 5 bilateral and  clearing of subungual debris. No ulceration, no infection noted.  Return Visit-Office Procedure: Patient instructed to return to the office for a follow up visit 3 months for continued evaluation and treatment.  Boneta Lucks, DPM    No follow-ups on file.     No follow-ups on file.

## 2022-08-09 ENCOUNTER — Telehealth: Payer: Self-pay | Admitting: Family Medicine

## 2022-08-09 ENCOUNTER — Telehealth: Payer: Self-pay | Admitting: Cardiovascular Disease

## 2022-08-09 ENCOUNTER — Telehealth: Payer: Self-pay | Admitting: *Deleted

## 2022-08-09 MED ORDER — EZETIMIBE 10 MG PO TABS: 10.0000 mg | ORAL_TABLET | Freq: Every day | ORAL | 2 refills | Status: DC

## 2022-08-09 NOTE — Addendum Note (Signed)
Addended by: Emelia Salisbury C on: 08/09/2022 03:49 PM   Modules accepted: Orders

## 2022-08-09 NOTE — Telephone Encounter (Signed)
Refill sent to pharmacy per patient's request.

## 2022-08-09 NOTE — Telephone Encounter (Signed)
Pt called back and she has been scheduled for a tele pre op appt 08/16/22 @ 3 pm. Med rec and consent are done.     Patient Consent for Virtual Visit        Renee [name] has provided verbal consent on 08/09/2022 for a virtual visit (video or telephone).   CONSENT FOR VIRTUAL VISIT FOR:  Renee [name]  By participating in this virtual visit I agree to the following:  I hereby voluntarily request, consent and authorize [facility] and its employed or contracted physicians, physician assistants, nurse practitioners or other licensed health care professionals (the Practitioner), to provide me with telemedicine health care services (the "Services") as deemed necessary by the treating Practitioner. I acknowledge and consent to receive the Services by the Practitioner via telemedicine. I understand that the telemedicine visit will involve communicating with the Practitioner through live audiovisual communication technology and the disclosure of certain medical information by electronic transmission. I acknowledge that I have been given the opportunity to request an in-person assessment or other available alternative prior to the telemedicine visit and am voluntarily participating in the telemedicine visit.  I understand that I have the right to withhold or withdraw my consent to the use of telemedicine in the course of my care at any time, without affecting my right to future care or treatment, and that the Practitioner or I may terminate the telemedicine visit at any time. I understand that I have the right to inspect all information obtained and/or recorded in the course of the telemedicine visit and may receive copies of available information for a reasonable fee.  I understand that some of the potential risks of receiving the Services via telemedicine include:  Delay or interruption in medical evaluation due to technological equipment failure or disruption; Information transmitted may not be  sufficient (e.g. poor resolution of images) to allow for appropriate medical decision making by the Practitioner; and/or  In rare instances, security protocols could fail, causing a breach of personal health information.  Furthermore, I acknowledge that it is my responsibility to provide information about my medical history, conditions and care that is complete and accurate to the best of my ability. I acknowledge that Practitioner's advice, recommendations, and/or decision may be based on factors not within their control, such as incomplete or inaccurate data provided by me or distortions of diagnostic images or specimens that may result from electronic transmissions. I understand that the practice of medicine is not an exact science and that Practitioner makes no warranties or guarantees regarding treatment outcomes. I acknowledge that a copy of this consent can be made available to me via my patient portal ([place]), or I can request a printed copy by calling the office of [facility].    I understand that my insurance will be billed for this visit.   I have read or had this consent read to me. I understand the contents of this consent, which adequately explains the benefits and risks of the Services being provided via telemedicine.  I have been provided ample opportunity to ask questions regarding this consent and the Services and have had my questions answered to my satisfaction. I give my informed consent for the services to be provided through the use of telemedicine in my medical care

## 2022-08-09 NOTE — Telephone Encounter (Signed)
Left message to call back for tele appt pre op

## 2022-08-09 NOTE — Telephone Encounter (Signed)
Pt called back and she has been scheduled for a tele pre op appt 08/16/22 @ 3 pm. Med rec and consent are done.

## 2022-08-09 NOTE — Telephone Encounter (Signed)
Called [facility] and cancelled refill there.Resent to [employer].

## 2022-08-09 NOTE — Telephone Encounter (Signed)
  Encourage patient to contact the pharmacy for refills or they can request refills through [facility]  Did the patient contact the pharmacy: No  LAST APPOINTMENT DATE: 06/04/22  NEXT APPOINTMENT DATE: 12/07/22  MEDICATION: ezetimibe (ZETIA) 10 MG tablet  Is the patient out of medication? No  If not, how much is left? 3 left  Is this a 90 day supply: Yes  PHARMACY: [facility] [id number], [place]  Let patient know to contact pharmacy at the end of the day to make sure medication is ready.  Please notify patient to allow 48-72 hours to process

## 2022-08-09 NOTE — Telephone Encounter (Signed)
   Name: Renee [name]  DOB: 08-06-1946  MRN: [id number]  Primary Cardiologist: Dr. Rockey Situ   Preoperative team, please contact this patient and set up a phone call appointment for further preoperative risk assessment. Please obtain consent and complete medication review. Thank you for your help.  I confirm that guidance regarding antiplatelet and oral anticoagulation therapy has been completed and, if necessary, noted below.  Looks like patient has non-obstructive CAD but no prior PCI. Therefore, from cardiac standpoint Aspirin can likely be held. She does have a history of stroke listed in her chart so would also recommend surgeon reach out to PCP for recommendations for holding Aspirin.   Darreld Mclean, PA-C 08/09/2022, 3:12 PM [place]

## 2022-08-09 NOTE — Telephone Encounter (Signed)
   Pre-operative Risk Assessment    Patient Name: Renee [name]  DOB: 21-Jun-1946 MRN: [id number]{      Request for Surgical Clearance    Procedure:  RT REVERSE SHOULDER ARTHROPLASTY   Date of Surgery:  Clearance TBD                               { Surgeon:  DR Lennette Bihari SUPPLE Surgeon's Group or Practice Name:  [facility] Phone number:  [phone] Fax number:  [phone]  Type of Clearance Requested:   - Pharmacy:  Hold Aspirin    Type of Anesthesia:  General    Additional requests/questions:    SignedEli Phillips   08/09/2022, 2:41 PM

## 2022-08-09 NOTE — Telephone Encounter (Signed)
Refaxed as requested. 

## 2022-08-09 NOTE — Telephone Encounter (Signed)
Patient called in stating that medication was suppose to be sent to  [facility] [id number] [address], [place] Phone:  [phone]  Fax:  [phone]     ezetimibe (ZETIA) 10 MG tablet

## 2022-08-09 NOTE — Telephone Encounter (Signed)
Seth Bake from [facility] called and asked could the surgical clearance form be faxed again,because they did not receive it. At [phone].

## 2022-08-13 NOTE — Progress Notes (Signed)
Called patient for blood glucose log as previously discussed.   Date  Blood Glucose - Fasting Blood Pressure Pulse 08/18  112    114/74   79 08/17  111    128/78   71 08/16  121    128/78   71 08/15  123    126/68   72 08/14  123    128/71   78 08/13  128    113/66   [place], CPP notified  Marijean Niemann, [place] [facility] Assistant [phone]

## 2022-08-16 ENCOUNTER — Ambulatory Visit (INDEPENDENT_AMBULATORY_CARE_PROVIDER_SITE_OTHER): Payer: Medicare Other | Admitting: Physician Assistant

## 2022-08-16 DIAGNOSIS — Z01810 Encounter for preprocedural cardiovascular examination: Secondary | ICD-10-CM

## 2022-08-16 NOTE — Progress Notes (Signed)
Virtual Visit via Telephone Note   Because of Renee [name] co-morbid illnesses, she is at least at moderate risk for complications without adequate follow up.  This format is felt to be most appropriate for this patient at this time.  The patient did not have access to video technology/had technical difficulties with video requiring transitioning to audio format only (telephone).  All issues noted in this document were discussed and addressed.  No physical exam could be performed with this format.  Please refer to the patient's chart for her consent to telehealth for [facility].  Evaluation Performed:  Preoperative cardiovascular risk assessment _____________   Date:  08/16/2022   Patient ID:  Renee [name], DOB Jun 26, 1946, MRN [id number] Patient Location:  Home Provider location:   Office  Primary Care Provider:  Jinny Sanders, MD Primary Cardiologist: Dr. Rockey Situ  Chief Complaint / Patient Profile   76 y.o. y/o female with a h/o HTN, HLD, DM, nonobstructive CAD by cath 2011, anxiety, arthritis, hypothyroidism, kidney stones, lichen sclerosis, stroke  who is pending right reverse shoulder arthroplasty and presents today for telephonic preoperative cardiovascular risk assessment.  Past Medical History    Past Medical History:  Diagnosis Date   Allergy    Anxiety    Arthritis    CAD (coronary artery disease)    non-obstructive. cath 11/11   Diabetes mellitus    borderline diabetic - diet controlled    History of chicken pox    History of phobia    clastrophobia   Hyperlipidemia    Hypertension    Hypothyroidism    Kidney stones    hx of    Lichen sclerosus    Psoriasis    Stroke ([place])    Urinary tract infection    hx of    Past Surgical History:  Procedure Laterality Date   APPENDECTOMY     CARDIAC CATHETERIZATION     2011   CARPAL TUNNEL RELEASE     Left trigger finger   CATARACT EXTRACTION W/PHACO Left 06/06/2018   Procedure: CATARACT EXTRACTION PHACO  AND INTRAOCULAR LENS PLACEMENT ([place]);  Surgeon: Birder Robson, MD;  Location: [facility] ORS;  Service: Ophthalmology;  Laterality: Left;  [place]  00:23 AP% 16.0 CDE 3.69 Fluid pack lot # [id number]   CATARACT EXTRACTION W/PHACO Right 09/19/2018   Procedure: CATARACT EXTRACTION PHACO AND INTRAOCULAR LENS PLACEMENT ([facility]);  Surgeon: Birder Robson, MD;  Location: [facility] ORS;  Service: Ophthalmology;  Laterality: Right;  [place] 00:29.5 AP% 17.0 CDE$ 5.03 Fluid pack lot # [id number]   COLONOSCOPY WITH PROPOFOL N/A 05/13/2017   Procedure: COLONOSCOPY WITH PROPOFOL;  Surgeon: Manya Silvas, MD;  Location: [facility] ENDOSCOPY;  Service: Endoscopy;  Laterality: N/A;   ESOPHAGOGASTRODUODENOSCOPY (EGD) WITH PROPOFOL N/A 01/04/2020   Procedure: ESOPHAGOGASTRODUODENOSCOPY (EGD) WITH PROPOFOL;  Surgeon: [name], MD;  Location: [facility] ENDOSCOPY;  Service: Gastroenterology;  Laterality: N/A;   EYE SURGERY  2004   lasik - right eye    JOINT REPLACEMENT     KNEE SURGERY Right 2015   arthroscopy   TOTAL KNEE ARTHROPLASTY Bilateral 06/12/2015   Procedure: TOTAL KNEE BILATERAL;  Surgeon: Gaynelle Arabian, MD;  Location: [facility] ORS;  Service: Orthopedics;  Laterality: Bilateral;  and epidural   TUBAL LIGATION     Tubes tied     and untied. and tied again    Allergies  Allergies  Allergen Reactions   Other Rash    Metal    Celexa [Citalopram] Other (See Comments)  Throat swelling and difficulty breathing   Codeine Nausea And Vomiting   Metformin Nausea And Vomiting   Nickel    Robaxin [Methocarbamol] Nausea Only   Crestor [Rosuvastatin] Rash   Simvastatin Swelling and Rash    History of Present Illness    Renee [name] is a 76 y.o. female who presents via audio/video conferencing for a telehealth visit today.  Pt was last seen in cardiology clinic on 12/11/21 by Dr. Rockey Situ.  At that time RAMIE PALLADINO was doing well.  The patient is now pending procedure as outlined above. Since her last visit, she reports she has  been stable with chronic mild DOE without any changes and no chest pain, palpitations or syncope. Reports excellent home BP control. Remote echo 2011 EF 65-70%, moderate LVH. Last cath 2011 with mild-moderate nonobstructive disease.   Home Medications    Prior to Admission medications   Medication Sig Start Date End Date Taking? Authorizing Provider  acetaminophen (TYLENOL) 500 MG tablet Take 500 mg by mouth every 8 (eight) hours as needed. Taking BID    [provider]  aspirin EC 81 MG tablet Take 74m by mouth daily.    [provider]  Blood Glucose Monitoring Suppl (ONETOUCH VERIO) w/Device KIT by Does not apply route. Use to check fasting blood sugar daily and an occasional 2 hr postprandial    [provider]  Carboxymethylcellulose Sodium (THERATEARS OP) Place 1 drop into both eyes at bedtime as needed.    [provider]  clobetasol ointment (TEMOVATE) 0.05 % APPLY TOPICALLY DAILY AS NEEDED FOR PSORIASIS FLARES 11/03/21   Bedsole, Amy E, MD  co-enzyme Q-10 30 MG capsule Take 200 mg daily 09/16/21   SGarvin Fila MD  Dulaglutide (TRULICITY) 07.03MJK/0.9FGSOPN Inject 0.75 mg into the skin once a week. Via [employer]    Bedsole, Amy E, MD  Emollient (CERAVE) CREA Apply 1 application topically daily as needed (psoriasis).    [provider]  erythromycin ophthalmic ointment Place 1 application into the left eye 3 (three) times daily. For 5-7 days 02/17/22   CMichela Pitcher NP  ezetimibe (ZETIA) 10 MG tablet Take 1 tablet (10 mg total) by mouth daily. 08/09/22   Bedsole, Amy E, MD  famotidine (PEPCID) 20 MG tablet Take 20 mg by mouth daily as needed for heartburn or indigestion.    [provider]  Flaxseed, Linseed, (FLAX SEED OIL PO) Take 1 capsule by mouth daily.    [provider]  fluticasone (FLONASE) 50 MCG/ACT nasal spray Place 1 spray into both nostrils daily as needed for allergies.    [provider]   Lancets ([facility]) lancets Use to check fasting blood sugar daily and an occasional 2 hr postprandial 06/30/21   Bedsole, Amy E, MD  levothyroxine (SYNTHROID) 75 MCG tablet TAKE 1 TABLET BY MOUTH EVERY DAY 01/07/22   Bedsole, Amy E, MD  losartan (COZAAR) 50 MG tablet TAKE 1 TABLET BY MOUTH EVERY DAY 04/05/22   Bedsole, Amy E, MD  MAGNESIUM PO Take by mouth daily.    [provider]  meloxicam (MOBIC) 7.5 MG tablet TAKE 1 TABLET BY MOUTH EVERY DAY Patient taking differently: Take 7.5 mg by mouth daily as needed. 02/26/22   Bedsole, Amy E, MD  Multiple Vitamin (MULTIVITAMIN) tablet Take 1 tablet by mouth daily.    [provider]  neomycin-polymyxin b-dexamethasone (MAXITROL) 3.5-10000-0.1 SUSP Place 1 drop into the left eye 3 (three) times  daily. 05/26/22   [provider]  nitroGLYCERIN (NITROSTAT) 0.4 MG SL tablet Place 1 tablet (0.4 mg total) under the tongue every 5 (five) minutes as needed. 12/11/21   Minna Merritts, MD  ONETOUCH VERIO test strip USE TO CHECK FASTING BLOOD SUGAR DAILY AND AN OCCASIONAL 2 HR POSTPRANDIAL. 06/29/22   Bedsole, Amy E, MD  Skin Protectants, Misc. (EUCERIN) cream Apply 1 application topically See admin instructions. Eczema relief applies to arms and hands daily    [provider]  triamcinolone cream (KENALOG) 0.1 % Apply 1 application topically 2 (two) times daily as needed (psoriasis). 12/04/21   Bedsole, Amy E, MD  Vitamin D, Cholecalciferol, 1000 units TABS Take 2,000 Units by mouth daily.    [provider]    Physical Exam    Vital Signs:  AVEEN STANSEL reports recent readings last week were WNL (I.e. BPs 1teens-120s/60s-70s with pulse 60s-70s)  Given telephonic nature of communication, physical exam is limited. AAOx3. NAD. Normal affect. Loquacious. Speech and respirations are unlabored.  Accessory Clinical Findings    None  Assessment & Plan    1.  Preoperative Cardiovascular Risk Assessment: Based  on risk factors including prior mild-moderate CAD and stroke, would consider her to be moderate CV risk for surgery. The patient affirms she has been doing well without any new cardiac symptoms. They are able to achieve over 4 METS. She is able to go up stairs and perform ADLs without any chest pain or new dyspnea. She reports chronic mild DOE which she states has been present for a long time without any recent changes. She is able to "do everything I want to do without a problem." Therefore, based on ACC/AHA guidelines, the patient would be at acceptable risk for the planned procedure without further cardiovascular testing. The patient was advised that if she develops new symptoms prior to surgery to contact our office to arrange for a follow-up visit, and she verbalized understanding.  Regarding antiplatelet recommendations, patient has non-obstructive CAD but no prior PCI/MI. Therefore, from cardiac standpoint aspirin can be held if surgeon feels that bleeding risk is prohibitive. She does have a history of stroke listed in her chart so would also recommend surgeon reach out to PCP for recommendations for holding aspirin.  A copy of this note will be routed to requesting surgeon.  Time:   Today, I have spent 8 minutes with the patient with telehealth technology discussing medical history, symptoms, and management plan.     Charlie Pitter, PA-C  08/16/2022, 12:22 PM

## 2022-08-18 NOTE — Telephone Encounter (Signed)
Pt is asking to see one of the nurse practitioners since nothing has opened up sooner with Dr. Leonie Man. Can she be scheduled with NP or should she remain on Dr. Clydene Fake wait list until an appointment opens up with him?

## 2022-08-19 ENCOUNTER — Telehealth: Payer: Self-pay | Admitting: Neurology

## 2022-08-19 NOTE — Telephone Encounter (Signed)
LVM and sent mychart msg asking pt to call back to schedule

## 2022-08-19 NOTE — Telephone Encounter (Signed)
LVM and sent mychart msg asking pt to call back to schedule a sooner f/u with any NP due to Dr. Clydene Fake schedule being booked up. Per Verlin Grills, if pt calls back to reschedule she can see any NP if symptoms are stable and has no new symptoms. If symptoms have worsened or has new symptoms pt is to be scheduled with Dr. Leonie Man

## 2022-08-19 NOTE — Telephone Encounter (Signed)
If symptoms have remain somewhat stable and no new symptoms ok to schedule with NP if pt reports any new symptoms best to keep with MD.  Thanks!

## 2022-08-27 ENCOUNTER — Telehealth: Payer: Self-pay | Admitting: Family Medicine

## 2022-08-27 NOTE — Telephone Encounter (Signed)
I would suggest she waits until mid to late September to determine if a more updated COVID booster is available.

## 2022-08-27 NOTE — Telephone Encounter (Signed)
Patient said she had 3 regular covid shots and 1 booster and she said she has surgery coming up 10/21/22 and wanted to know if she should go ahead and get another booster or whatever they are giving out now for covid. She wanted to make sure it want effect her surgery. Call back is [phone]

## 2022-08-27 NOTE — Telephone Encounter (Signed)
Left message for Renee [name] that Dr. Diona Browner recommends she waits until mid to late September to determine if a more updated COVID booster becomes available.

## 2022-08-28 DIAGNOSIS — Z23 Encounter for immunization: Secondary | ICD-10-CM | POA: Diagnosis not present

## 2022-09-16 ENCOUNTER — Ambulatory Visit: Payer: Medicare Other | Admitting: Neurology

## 2022-09-29 ENCOUNTER — Telehealth: Payer: Self-pay | Admitting: Family Medicine

## 2022-09-29 DIAGNOSIS — Z23 Encounter for immunization: Secondary | ICD-10-CM | POA: Diagnosis not present

## 2022-09-29 NOTE — Telephone Encounter (Signed)
Please let her know she should go ahead and receive the new COVID-vaccine now so that she is protected by the time she is in the hospital for surgery.

## 2022-09-29 NOTE — Telephone Encounter (Signed)
Pt called asking would it be ok for pt to get the new covid booster? Pt stated she is undergoing surgery 10/21/22. Pt was curious if she should receive booster saying that her surgery is coming up? Pt stated she found out that Kristopher Oppenheim can give the booster today, 09/29/22 @ 1pm. Call back # [phone]

## 2022-09-29 NOTE — Telephone Encounter (Signed)
Renee [name] notified as instructed by telephone.  Patient states understanding.

## 2022-10-03 ENCOUNTER — Other Ambulatory Visit: Payer: Self-pay | Admitting: Family Medicine

## 2022-10-11 NOTE — Progress Notes (Signed)
Patient has sensitivity to CHG, instructed her to use an antibacterial soap at home.  COVID Vaccine Completed: yes  Date of COVID positive in last 90 days: no  PCP - Eliezer Lofts, MD Cardiologist - Dr.Gollan  Cardiac clearance by Melina Copa 08/16/22 in Epic  Chest x-ray - no EKG - 12/11/21 Epic Stress Test - n/a ECHO - 2011 Cardiac Cath - 2011 per patient and family Pacemaker/ICD device last checked: n/a Spinal Cord Stimulator: n/a  Bowel Prep - no  Sleep Study - n/a CPAP -   Fasting Blood Sugar - 100-120 Checks Blood Sugar 1 time per week  Blood Thinner Instructions: Aspirin Instructions: ASA 81, no instructions, pt will call for instructions Last Dose:  Activity level: Can go up a flight of stairs and perform activities of daily living without stopping and without symptoms of chest pain or shortness of breath.  Anesthesia review: CAD,HTN, DM2, TIA (reports no residual deficits)   Patient denies shortness of breath, fever, cough and chest pain at PAT appointment  Patient verbalized understanding of instructions that were given to them at the PAT appointment. Patient was also instructed that they will need to review over the PAT instructions again at home before surgery.

## 2022-10-11 NOTE — Patient Instructions (Signed)
SURGICAL WAITING ROOM VISITATION Patients having surgery or a procedure may have no more than 2 support people in the waiting area - these visitors may rotate.   Children under the age of 21 must have an adult with them who is not the patient. If the patient needs to stay at the hospital during part of their recovery, the visitor guidelines for inpatient rooms apply. Pre-op nurse will coordinate an appropriate time for 1 support person to accompany patient in pre-op.  This support person may not rotate.    Please refer to the [facility] website for the visitor guidelines for Inpatients (after your surgery is over and you are in a regular room).    Your procedure is scheduled on: 10/21/22   Report to [facility] Main Entrance    Report to admitting at 10:00 AM   Call this number if you have problems the morning of surgery [phone]   Do not eat food :After Midnight.   After Midnight you may have the following liquids until 9:30 AM DAY OF SURGERY  Water Non-Citrus Juices (without pulp, NO RED) Carbonated Beverages Black Coffee (NO MILK/CREAM OR CREAMERS, sugar ok)  Clear Tea (NO MILK/CREAM OR CREAMERS, sugar ok) regular and decaf                             Plain Jell-O (NO RED)                                           Fruit ices (not with fruit pulp, NO RED)                                     Popsicles (NO RED)                                                               Sports drinks like Gatorade (NO RED)                 The day of surgery:  Drink ONE (1) Pre-Surgery G2 at 9:30 AM the morning of surgery. Drink in one sitting. Do not sip.  This drink was given to you during your hospital  pre-op appointment visit. Nothing else to drink after completing the  Pre-Surgery G2.          If you have questions, please contact your surgeon's office.   FOLLOW BOWEL PREP AND ANY ADDITIONAL PRE OP INSTRUCTIONS YOU RECEIVED FROM YOUR SURGEON'S OFFICE!!!     Oral Hygiene  is also important to reduce your risk of infection.                                    Remember - BRUSH YOUR TEETH THE MORNING OF SURGERY WITH YOUR REGULAR TOOTHPASTE   Take these medicines the morning of surgery with A SIP OF WATER: Tylenol, Zetia, Pepcid, Levothyroxine   DO NOT TAKE ANY ORAL DIABETIC MEDICATIONS DAY OF YOUR SURGERY  How to Manage Your  Diabetes Before and After Surgery  Why is it important to control my blood sugar before and after surgery? Improving blood sugar levels before and after surgery helps healing and can limit problems. A way of improving blood sugar control is eating a healthy diet by:  Eating less sugar and carbohydrates  Increasing activity/exercise  Talking with your doctor about reaching your blood sugar goals High blood sugars (greater than 180 mg/dL) can raise your risk of infections and slow your recovery, so you will need to focus on controlling your diabetes during the weeks before surgery. Make sure that the doctor who takes care of your diabetes knows about your planned surgery including the date and location.  How do I manage my blood sugar before surgery? Check your blood sugar at least 4 times a day, starting 2 days before surgery, to make sure that the level is not too high or low. Check your blood sugar the morning of your surgery when you wake up and every 2 hours until you get to the Short Stay unit. If your blood sugar is less than 70 mg/dL, you will need to treat for low blood sugar: Do not take insulin. Treat a low blood sugar (less than 70 mg/dL) with  cup of clear juice (cranberry or apple), 4 glucose tablets, OR glucose gel. Recheck blood sugar in 15 minutes after treatment (to make sure it is greater than 70 mg/dL). If your blood sugar is not greater than 70 mg/dL on recheck, call [phone] for further instructions. Report your blood sugar to the short stay nurse when you get to Short Stay.  If you are admitted to the hospital  after surgery: Your blood sugar will be checked by the staff and you will probably be given insulin after surgery (instead of oral diabetes medicines) to make sure you have good blood sugar levels. The goal for blood sugar control after surgery is 80-180 mg/dL.   WHAT DO I DO ABOUT MY DIABETES MEDICATION?  Do no take Trulicity 78/93/81  DO NOT TAKE THE FOLLOWING 7 DAYS PRIOR TO SURGERY: Ozempic, Wegovy, Rybelsus (Semaglutide), Byetta (exenatide), Bydureon (exenatide ER), Victoza, Saxenda (liraglutide), or Trulicity (dulaglutide) Mounjaro (Tirzepatide) Adlyxin (Lixisenatide), Polyethylene Glycol Loxenatide.   Reviewed and Endorsed by [facility] Patient Education Committee, August 2015                               You may not have any metal on your body including hair pins, jewelry, and body piercing             Do not wear make-up, lotions, powders, perfumes, or deodorant  Do not wear nail polish including gel and S&S, artificial/acrylic nails, or any other type of covering on natural nails including finger and toenails. If you have artificial nails, gel coating, etc. that needs to be removed by a nail salon please have this removed prior to surgery or surgery may need to be canceled/ delayed if the surgeon/ anesthesia feels like they are unable to be safely monitored.   Do not shave  48 hours prior to surgery.    Do not bring valuables to the hospital. [place].  DO NOT [place]. PHARMACY WILL DISPENSE MEDICATIONS LISTED ON YOUR MEDICATION LIST TO YOU DURING YOUR ADMISSION [place]!  Patients discharged on the day of surgery will not be allowed to drive home.  Someone NEEDS to stay with you for the first 24 hours after anesthesia.              Please read over the following fact sheets you were given: IF [facility] [name]     If you received a COVID test during your pre-op visit  it is requested that you wear a mask when out in public, stay away from anyone that may not be feeling well and notify your surgeon if you develop symptoms. If you test positive for Covid or have been in contact with anyone that has tested positive in the last 10 days please notify you surgeon.     [place] - Preparing for Surgery Before surgery, you can play an important role.  Because skin is not sterile, your skin needs to be as free of germs as possible.  You can reduce the number of germs on your skin by washing with CHG (chlorahexidine gluconate) soap before surgery.  CHG is an antiseptic cleaner which kills germs and bonds with the skin to continue killing germs even after washing. Please DO NOT use if you have an allergy to CHG or antibacterial soaps.  If your skin becomes reddened/irritated stop using the CHG and inform your nurse when you arrive at Short Stay. Do not shave (including legs and underarms) for at least 48 hours prior to the first CHG shower.  You may shave your face/neck.  Please follow these instructions carefully:  1.  Shower with CHG Soap the night before surgery and the  morning of surgery.  2.  If you choose to wash your hair, wash your hair first as usual with your normal  shampoo.  3.  After you shampoo, rinse your hair and body thoroughly to remove the shampoo.                             4.  Use CHG as you would any other liquid soap.  You can apply chg directly to the skin and wash.  Gently with a scrungie or clean washcloth.  5.  Apply the CHG Soap to your body ONLY FROM THE NECK DOWN.   Do   not use on face/ open                           Wound or open sores. Avoid contact with eyes, ears mouth and   genitals (private parts).                       Wash face,  Genitals (private parts) with your normal soap.             6.  Wash thoroughly, paying special attention to the area where your    surgery  will  be performed.  7.  Thoroughly rinse your body with warm water from the neck down.  8.  DO NOT shower/wash with your normal soap after using and rinsing off the CHG Soap.                9.  Pat yourself dry with a clean towel.            10.  Wear clean pajamas.  11.  Place clean sheets on your bed the night of your first shower and do not  sleep with pets. Day of Surgery : Do not apply any lotions/deodorants the morning of surgery.  Please wear clean clothes to the hospital/surgery center.  FAILURE TO FOLLOW THESE INSTRUCTIONS MAY RESULT IN THE CANCELLATION OF YOUR SURGERY  PATIENT SIGNATURE_________________________________  NURSE SIGNATURE__________________________________  ________________________________________________________________________   Adam Phenix  An incentive spirometer is a tool that can help keep your lungs clear and active. This tool measures how well you are filling your lungs with each breath. Taking long deep breaths may help reverse or decrease the chance of developing breathing (pulmonary) problems (especially infection) following: A long period of time when you are unable to move or be active. BEFORE THE PROCEDURE  If the spirometer includes an indicator to show your best effort, your nurse or respiratory therapist will set it to a desired goal. If possible, sit up straight or lean slightly forward. Try not to slouch. Hold the incentive spirometer in an upright position. INSTRUCTIONS FOR USE  Sit on the edge of your bed if possible, or sit up as far as you can in bed or on a chair. Hold the incentive spirometer in an upright position. Breathe out normally. Place the mouthpiece in your mouth and seal your lips tightly around it. Breathe in slowly and as deeply as possible, raising the piston or the ball toward the top of the column. Hold your breath for 3-5 seconds or for as long as possible. Allow the piston or ball to fall to the bottom of  the column. Remove the mouthpiece from your mouth and breathe out normally. Rest for a few seconds and repeat Steps 1 through 7 at least 10 times every 1-2 hours when you are awake. Take your time and take a few normal breaths between deep breaths. The spirometer may include an indicator to show your best effort. Use the indicator as a goal to work toward during each repetition. After each set of 10 deep breaths, practice coughing to be sure your lungs are clear. If you have an incision (the cut made at the time of surgery), support your incision when coughing by placing a pillow or rolled up towels firmly against it. Once you are able to get out of bed, walk around indoors and cough well. You may stop using the incentive spirometer when instructed by your caregiver.  RISKS AND COMPLICATIONS Take your time so you do not get dizzy or light-headed. If you are in pain, you may need to take or ask for pain medication before doing incentive spirometry. It is harder to take a deep breath if you are having pain. AFTER USE Rest and breathe slowly and easily. It can be helpful to keep track of a log of your progress. Your caregiver can provide you with a simple table to help with this. If you are using the spirometer at home, follow these instructions: [place] IF:  You are having difficultly using the spirometer. You have trouble using the spirometer as often as instructed. Your pain medication is not giving enough relief while using the spirometer. You develop fever of 100.5 F (38.1 C) or higher. SEEK IMMEDIATE MEDICAL CARE IF:  You cough up bloody sputum that had not been present before. You develop fever of 102 F (38.9 C) or greater. You develop worsening pain at or near the incision site. MAKE SURE YOU:  Understand these instructions. Will watch your condition. Will  get help right away if you are not doing well or get worse. Document Released: 04/25/2007 Document Revised:  03/06/2012 Document Reviewed: 06/26/2007 ExitCare Patient Information 2014 [employer].   ________________________________________________________________________   [facility]- Preparing for Total Shoulder Arthroplasty    Before surgery, you can play an important role. Because skin is not sterile, your skin needs to be as free of germs as possible. You can reduce the number of germs on your skin by using the following products. Benzoyl Peroxide Gel Reduces the number of germs present on the skin Applied twice a day to shoulder area starting two days before surgery    ==================================================================  Please follow these instructions carefully:  BENZOYL PEROXIDE 5% GEL  Please do not use if you have an allergy to benzoyl peroxide.   If your skin becomes reddened/irritated stop using the benzoyl peroxide.  Starting two days before surgery, apply as follows: Apply benzoyl peroxide in the morning and at night. Apply after taking a shower. If you are not taking a shower clean entire shoulder front, back, and side along with the armpit with a clean wet washcloth.  Place a quarter-sized dollop on your shoulder and rub in thoroughly, making sure to cover the front, back, and side of your shoulder, along with the armpit.   2 days before ____ AM   ____ PM              1 day before ____ AM   ____ PM                         Do this twice a day for two days.  (Last application is the night before surgery, AFTER using the CHG soap as described below).  Do NOT apply benzoyl peroxide gel on the day of surgery.

## 2022-10-12 ENCOUNTER — Encounter (HOSPITAL_COMMUNITY)
Admission: RE | Admit: 2022-10-12 | Discharge: 2022-10-12 | Disposition: A | Discharge status: Home / Self Care | Payer: Medicare Other | Source: Ambulatory Visit | Attending: Orthopedic Surgery | Admitting: Orthopedic Surgery

## 2022-10-12 ENCOUNTER — Encounter (HOSPITAL_COMMUNITY): Payer: Self-pay

## 2022-10-12 VITALS — BP 160/69 | HR 67 | Temp 97.7°F | Resp 14 | Ht 63.0 in | Wt 138.0 lb

## 2022-10-12 DIAGNOSIS — E1159 Type 2 diabetes mellitus with other circulatory complications: Secondary | ICD-10-CM | POA: Insufficient documentation

## 2022-10-12 DIAGNOSIS — Z01812 Encounter for preprocedural laboratory examination: Secondary | ICD-10-CM | POA: Diagnosis not present

## 2022-10-12 DIAGNOSIS — Z01818 Encounter for other preprocedural examination: Secondary | ICD-10-CM

## 2022-10-12 HISTORY — DX: Gastro-esophageal reflux disease without esophagitis: K21.9

## 2022-10-12 HISTORY — DX: Claustrophobia: F40.240

## 2022-10-12 LAB — HEMOGLOBIN A1C
Hgb A1c MFr Bld: 6 % — ABNORMAL HIGH (ref 4.8–5.6)
Mean Plasma Glucose: 125.5 mg/dL

## 2022-10-12 LAB — CBC
HCT: 38.9 % (ref 36.0–46.0)
Hemoglobin: 12.3 g/dL (ref 12.0–15.0)
MCH: 31.5 pg (ref 26.0–34.0)
MCHC: 31.6 g/dL (ref 30.0–36.0)
MCV: 99.5 fL (ref 80.0–100.0)
Platelets: 348 10*3/uL (ref 150–400)
RBC: 3.91 MIL/uL (ref 3.87–5.11)
RDW: 13 % (ref 11.5–15.5)
WBC: 6.5 10*3/uL (ref 4.0–10.5)
nRBC: 0 % (ref 0.0–0.2)

## 2022-10-12 LAB — BASIC METABOLIC PANEL
Anion gap: 7 (ref 5–15)
BUN: 16 mg/dL (ref 8–23)
CO2: 26 mmol/L (ref 22–32)
Calcium: 9.5 mg/dL (ref 8.9–10.3)
Chloride: 107 mmol/L (ref 98–111)
Creatinine, Ser: 0.57 mg/dL (ref 0.44–1.00)
GFR, Estimated: >60 mL/min (ref >60)
Glucose, Bld: 105 mg/dL — ABNORMAL HIGH (ref 70–99)
Potassium: 4.4 mmol/L (ref 3.5–5.1)
Sodium: 140 mmol/L (ref 135–145)

## 2022-10-12 LAB — GLUCOSE, CAPILLARY: Glucose-Capillary: 109 mg/dL — ABNORMAL HIGH (ref 70–99)

## 2022-10-13 LAB — SURGICAL PCR SCREEN
MRSA, PCR: POSITIVE — AB
Staphylococcus aureus: POSITIVE — AB

## 2022-10-13 NOTE — Progress Notes (Signed)
[facility]+ MRSA+, results routed to Dr.Supple

## 2022-10-14 NOTE — Anesthesia Preprocedure Evaluation (Addendum)
Anesthesia Evaluation  Patient identified by MRN, date of birth, ID band Patient awake    Reviewed: Allergy & Precautions, NPO status , Patient's Chart, lab work & pertinent test results  Airway Mallampati: II  TM Distance: >3 FB Neck ROM: Full    Dental  (+) Lower Dentures, Upper Dentures   Pulmonary neg pulmonary ROS,    Pulmonary exam normal breath sounds clear to auscultation       Cardiovascular hypertension, Pt. on medications + CAD  Normal cardiovascular exam Rhythm:Regular Rate:Normal  EKG 12/11/21 NSR, Normal  Non obstructive CAD at cath 2011 Asymptomatic from cardiac standpoint   Neuro/Psych PSYCHIATRIC DISORDERS Anxiety Depression Claustrophobia Panic attacks Neuromuscular disease CVA, No Residual Symptoms    GI/Hepatic Neg liver ROS, GERD  ,  Endo/Other  diabetes, Well Controlled, Type 2Hypothyroidism GLP-1 agonist therapy  Renal/GU Renal diseaseHx/o renal calculi  negative genitourinary   Musculoskeletal  (+) Arthritis , Osteoarthritis,  Right shoulder arthropathy   Abdominal   Peds  Hematology negative hematology ROS (+)   Anesthesia Other Findings   Reproductive/Obstetrics                           Anesthesia Physical Anesthesia Plan  ASA: 2  Anesthesia Plan: General   Post-op Pain Management: Regional block* and Minimal or no pain anticipated   Induction: Intravenous  PONV Risk Score and Plan: 4 or greater and Treatment may vary due to age or medical condition, Ondansetron and Dexamethasone  Airway Management Planned: Oral ETT  Additional Equipment: None  Intra-op Plan:   Post-operative Plan: Extubation in OR  Informed Consent: I have reviewed the patients History and Physical, chart, labs and discussed the procedure including the risks, benefits and alternatives for the proposed anesthesia with the patient or authorized representative who has indicated  his/her understanding and acceptance.     Dental advisory given  Plan Discussed with: CRNA and Anesthesiologist  Anesthesia Plan Comments: (See PAT note 10/12/2022)       Anesthesia Quick Evaluation

## 2022-10-14 NOTE — Progress Notes (Signed)
Anesthesia Chart Review   Case: [id number] Date/Time: 10/21/22 1215   Procedure: REVERSE SHOULDER ARTHROPLASTY (Right: Shoulder) - 172mn   Anesthesia type: General   Pre-op diagnosis: Right shoulder rotator cuff tear arthropathy   Location: [name] / [facility]   Surgeons: SJustice Britain MD       DISCUSSION:76 y.o. never smoker with h/o HTN, DM II, CVA, nonobstructive CAD by cath 2011, right shoulder arthropathy scheduled for above procedure 10/21/2022 with Dr. KJustice Britain   Pt last seen by cardiology 08/16/2022. Per OV note, "Preoperative Cardiovascular Risk Assessment: Based on risk factors including prior mild-moderate CAD and stroke, would consider her to be moderate CV risk for surgery. The patient affirms she has been doing well without any new cardiac symptoms. They are able to achieve over 4 METS. She is able to go up stairs and perform ADLs without any chest pain or new dyspnea. She reports chronic mild DOE which she states has been present for a long time without any recent changes. She is able to "do everything I want to do without a problem." Therefore, based on ACC/AHA guidelines, the patient would be at acceptable risk for the planned procedure without further cardiovascular testing. The patient was advised that if she develops new symptoms prior to surgery to contact our office to arrange for a follow-up visit, and she verbalized understanding.   Regarding antiplatelet recommendations, patient has non-obstructive CAD but no prior PCI/MI. Therefore, from cardiac standpoint aspirin can be held if surgeon feels that bleeding risk is prohibitive. She does have a history of stroke listed in her chart so would also recommend surgeon reach out to PCP for recommendations for holding aspirin."  Anticipate pt can proceed with planned procedure barring acute status change.   VS: BP (!) 160/69   Pulse 67   Temp 36.5 C (Oral)   Resp 14   Ht _0  (1.6 m)   Wt 62.6 kg   SpO2 99%   BMI  24.45 kg/m   PROVIDERS: BJinny Sanders MD is PCP   Primary Cardiologist: Dr. GRockey SituLABS: Labs reviewed: Acceptable for surgery. (all labs ordered are listed, but only abnormal results are displayed)  Labs Reviewed  SURGICAL PCR SCREEN - Abnormal; Notable for the following components:      Result Value   MRSA, PCR POSITIVE (*)    Staphylococcus aureus POSITIVE (*)    All other components within normal limits  HEMOGLOBIN A1C - Abnormal; Notable for the following components:   Hgb A1c MFr Bld 6.0 (*)    All other components within normal limits  BASIC METABOLIC PANEL - Abnormal; Notable for the following components:   Glucose, Bld 105 (*)    All other components within normal limits  GLUCOSE, CAPILLARY - Abnormal; Notable for the following components:   Glucose-Capillary 109 (*)    All other components within normal limits  CBC     IMAGES:   EKG:   CV: Echo 10/25/2010 Study Conclusions   Left ventricle: The cavity size was normal. There was moderate   concentric hypertrophy. Systolic function was vigorous. The   estimated ejection fraction was in the range of 65% to 70%. Wall   motion was normal; there were no regional wall motion abnormalities.      Transthoracic echocardiography. M-mode, complete 2D, spectral   Doppler, and color Doppler. Height: Height: 165.1cm. Height: 65in.   Weight: Weight: 75kg. Weight: 165lb. Body mass index: BMI:   27.5kg/m^2. Body surface area: BSA: 1.881m.  Blood pressure: 147/74.   Patient status: Inpatient. Location: Bedside.   Past Medical History:  Diagnosis Date   Allergy    Anxiety    Arthritis    CAD (coronary artery disease)    non-obstructive. cath 11/11   Claustrophobia    Diabetes mellitus    borderline diabetic - diet controlled    GERD (gastroesophageal reflux disease)    History of chicken pox    History of phobia    clastrophobia   Hyperlipidemia    Hypertension    Hypothyroidism    Kidney stones    hx of     Lichen sclerosus    Psoriasis    Stroke ([place])    Urinary tract infection    hx of     Past Surgical History:  Procedure Laterality Date   APPENDECTOMY     CARDIAC CATHETERIZATION     2011   CARPAL TUNNEL RELEASE     Left trigger finger   CATARACT EXTRACTION W/PHACO Left 06/06/2018   Procedure: CATARACT EXTRACTION PHACO AND INTRAOCULAR LENS PLACEMENT ([place]);  Surgeon: Birder Robson, MD;  Location: [facility];  Service: Ophthalmology;  Laterality: Left;  [place]  00:23 AP% 16.0 CDE 3.69 Fluid pack lot # [id number]   CATARACT EXTRACTION W/PHACO Right 09/19/2018   Procedure: CATARACT EXTRACTION PHACO AND INTRAOCULAR LENS PLACEMENT ([facility]);  Surgeon: Birder Robson, MD;  Location: [facility] OR[facility];  Service: Ophthalmology;  Laterality: Right;  [place] 00:29.5 AP% 17.0 CDE$ 5.03 Fluid pack lot # [id number]   COLONOSCOPY WITH PROPOFOL N/A 05/13/2017   Procedure: COLONOSCOPY WITH PROPOFOL;  Surgeon: Manya Silvas, MD;  [facility] END[facility]COPY;  Service: Endoscopy;  Laterality: N/A;   ESOPHAGOGASTRODUODENOSCOPY (EGD) WITH PROPOFOL N/A 01/04/2020   Procedure: ESOPHAGOGASTRODUODENOSCOPY (EGD) WITH PROPOFOL;  Surgeon: [name], MD;  Location: [facility] ENDOSCOPY;  Service: Gastroenterology;  Laterality: N/A;   EYE SURGERY  2004   lasik - right eye    JOINT REPLACEMENT     KNEE SURGERY Right 2015   arthroscopy   TOTAL KNEE ARTHROPLASTY Bilateral 06/12/2015   Procedure: TOTAL KNEE BILATERAL;  Surgeon: Gaynelle Arabian, MD;  Location: [facility];  Service: Orthopedics;  Laterality: Bilateral;  and epidural   TUBAL LIGATION     Tubes tied     and untied. and tied again    MEDICATIONS:  acetaminophen (TYLENOL) 650 MG CR tablet   aspirin EC 81 MG tablet   Blood Glucose Monitoring Suppl (ONETOUCH VERIO) w/Device KIT   Carboxymethylcellulose Sodium (THERATEARS OP)   clobetasol ointment (TEMOVATE) 0.05 %   Coenzyme Q10 (COQ10) 200 MG CAPS   Dulaglutide (TRULICITY) 5.46 EV/0.3JK SOPN   Emollient (CERAVE) CREA    erythromycin ophthalmic ointment   ezetimibe (ZETIA) 10 MG tablet   famotidine (PEPCID) 20 MG tablet   Flaxseed, Linseed, (FLAX SEED OIL PO)   fluticasone (FLONASE) 50 MCG/ACT nasal spray   Lancets (ONETOUCH ULTRASOFT) lancets   levothyroxine (SYNTHROID) 75 MCG tablet   losartan (COZAAR) 50 MG tablet   MAGNESIUM PO   meloxicam (MOBIC) 7.5 MG tablet   Multiple Vitamin (MULTIVITAMIN) tablet   neomycin-polymyxin b-dexamethasone (MAXITROL) 3.5-10000-0.1 SUSP   nitroGLYCERIN (NITROSTAT) 0.4 MG SL tablet   ONETOUCH VERIO test strip   Skin Protectants, Misc. (EUCERIN) cream   triamcinolone cream (KENALOG) 0.1 %   Vitamin D, Cholecalciferol, 25 MCG (1000 UT) CAPS   No current facility-administered medications for this encounter.     Konrad Felix Ward, PA-C [facility] [phone]

## 2022-10-21 ENCOUNTER — Ambulatory Visit (HOSPITAL_COMMUNITY)
Admission: RE | Admit: 2022-10-21 | Discharge: 2022-10-21 | Disposition: A | Discharge status: Home / Self Care | Payer: Medicare Other | Attending: Orthopedic Surgery | Admitting: Orthopedic Surgery

## 2022-10-21 ENCOUNTER — Encounter (HOSPITAL_COMMUNITY): Payer: Self-pay | Admitting: Orthopedic Surgery

## 2022-10-21 ENCOUNTER — Other Ambulatory Visit: Payer: Self-pay

## 2022-10-21 ENCOUNTER — Ambulatory Visit (HOSPITAL_BASED_OUTPATIENT_CLINIC_OR_DEPARTMENT_OTHER): Payer: Medicare Other | Admitting: Certified Registered Nurse Anesthetist

## 2022-10-21 ENCOUNTER — Ambulatory Visit (HOSPITAL_COMMUNITY): Payer: Medicare Other | Admitting: Physician Assistant

## 2022-10-21 ENCOUNTER — Encounter (HOSPITAL_COMMUNITY)
Admission: RE | Disposition: A | Discharge status: Home / Self Care | Payer: Self-pay | Source: Home / Self Care | Attending: Orthopedic Surgery

## 2022-10-21 DIAGNOSIS — F419 Anxiety disorder, unspecified: Secondary | ICD-10-CM | POA: Diagnosis not present

## 2022-10-21 DIAGNOSIS — K219 Gastro-esophageal reflux disease without esophagitis: Secondary | ICD-10-CM | POA: Insufficient documentation

## 2022-10-21 DIAGNOSIS — Z791 Long term (current) use of non-steroidal anti-inflammatories (NSAID): Secondary | ICD-10-CM | POA: Insufficient documentation

## 2022-10-21 DIAGNOSIS — E119 Type 2 diabetes mellitus without complications: Secondary | ICD-10-CM | POA: Insufficient documentation

## 2022-10-21 DIAGNOSIS — M19011 Primary osteoarthritis, right shoulder: Secondary | ICD-10-CM | POA: Diagnosis not present

## 2022-10-21 DIAGNOSIS — M75101 Unspecified rotator cuff tear or rupture of right shoulder, not specified as traumatic: Secondary | ICD-10-CM | POA: Diagnosis not present

## 2022-10-21 DIAGNOSIS — G8918 Other acute postprocedural pain: Secondary | ICD-10-CM | POA: Diagnosis not present

## 2022-10-21 DIAGNOSIS — I10 Essential (primary) hypertension: Secondary | ICD-10-CM | POA: Diagnosis not present

## 2022-10-21 DIAGNOSIS — E039 Hypothyroidism, unspecified: Secondary | ICD-10-CM | POA: Diagnosis not present

## 2022-10-21 DIAGNOSIS — Z7985 Long-term (current) use of injectable non-insulin antidiabetic drugs: Secondary | ICD-10-CM | POA: Insufficient documentation

## 2022-10-21 DIAGNOSIS — I2510 Atherosclerotic heart disease of native coronary artery without angina pectoris: Secondary | ICD-10-CM | POA: Insufficient documentation

## 2022-10-21 DIAGNOSIS — M12811 Other specific arthropathies, not elsewhere classified, right shoulder: Secondary | ICD-10-CM | POA: Diagnosis not present

## 2022-10-21 DIAGNOSIS — F32A Depression, unspecified: Secondary | ICD-10-CM | POA: Diagnosis not present

## 2022-10-21 DIAGNOSIS — E1159 Type 2 diabetes mellitus with other circulatory complications: Secondary | ICD-10-CM

## 2022-10-21 HISTORY — PX: REVERSE SHOULDER ARTHROPLASTY: SHX5054

## 2022-10-21 LAB — GLUCOSE, CAPILLARY
Glucose-Capillary: 122 mg/dL — ABNORMAL HIGH (ref 70–99)
Glucose-Capillary: 104 mg/dL — ABNORMAL HIGH (ref 70–99)
Glucose-Capillary: 158 mg/dL — ABNORMAL HIGH (ref 70–99)

## 2022-10-21 SURGERY — ARTHROPLASTY, SHOULDER, TOTAL, REVERSE
Anesthesia: General | Site: Shoulder | Laterality: Right

## 2022-10-21 MED ORDER — ONDANSETRON HCL 4 MG/2ML IJ SOLN: 4.0000 mg | Freq: Once | INTRAMUSCULAR | Status: DC | PRN

## 2022-10-21 MED ORDER — FENTANYL CITRATE PF 50 MCG/ML IJ SOSY: 25.0000 ug | PREFILLED_SYRINGE | INTRAMUSCULAR | Status: DC | PRN

## 2022-10-21 MED ORDER — DEXAMETHASONE SODIUM PHOSPHATE 10 MG/ML IJ SOLN
INTRAMUSCULAR | Status: AC
  Filled 2022-10-21: qty 1

## 2022-10-21 MED ORDER — HYDROCODONE-ACETAMINOPHEN 5-325 MG PO TABS: 1.0000 | ORAL_TABLET | ORAL | 0 refills | Status: DC | PRN

## 2022-10-21 MED ORDER — DEXAMETHASONE SODIUM PHOSPHATE 10 MG/ML IJ SOLN
INTRAMUSCULAR | Status: DC | PRN
  Administered 2022-10-21: 5 mg via INTRAVENOUS

## 2022-10-21 MED ORDER — TRANEXAMIC ACID-NACL 1000-0.7 MG/100ML-% IV SOLN
1000.0000 mg | INTRAVENOUS | Status: AC
  Administered 2022-10-21: 1000 mg via INTRAVENOUS
  Filled 2022-10-21: qty 100

## 2022-10-21 MED ORDER — SUGAMMADEX SODIUM 200 MG/2ML IV SOLN
INTRAVENOUS | Status: DC | PRN
  Administered 2022-10-21: 200 mg via INTRAVENOUS

## 2022-10-21 MED ORDER — METOCLOPRAMIDE HCL 5 MG/ML IJ SOLN: 5.0000 mg | Freq: Three times a day (TID) | INTRAMUSCULAR | Status: DC | PRN

## 2022-10-21 MED ORDER — ONDANSETRON HCL 4 MG/2ML IJ SOLN
INTRAMUSCULAR | Status: AC
  Filled 2022-10-21: qty 2

## 2022-10-21 MED ORDER — PHENYLEPHRINE HCL-NACL 20-0.9 MG/250ML-% IV SOLN
INTRAVENOUS | Status: DC | PRN
  Administered 2022-10-21: 35 ug/min via INTRAVENOUS

## 2022-10-21 MED ORDER — ROCURONIUM BROMIDE 10 MG/ML (PF) SYRINGE
PREFILLED_SYRINGE | INTRAVENOUS | Status: AC
  Filled 2022-10-21: qty 10

## 2022-10-21 MED ORDER — ONDANSETRON HCL 4 MG/2ML IJ SOLN: 4.0000 mg | Freq: Four times a day (QID) | INTRAMUSCULAR | Status: DC | PRN

## 2022-10-21 MED ORDER — STERILE WATER FOR IRRIGATION IR SOLN
Status: DC | PRN
  Administered 2022-10-21: 2000 mL

## 2022-10-21 MED ORDER — FENTANYL CITRATE (PF) 100 MCG/2ML IJ SOLN
INTRAMUSCULAR | Status: DC | PRN
  Administered 2022-10-21: 100 ug via INTRAVENOUS

## 2022-10-21 MED ORDER — PROPOFOL 10 MG/ML IV BOLUS
INTRAVENOUS | Status: DC | PRN
  Administered 2022-10-21: 100 mg via INTRAVENOUS

## 2022-10-21 MED ORDER — BUPIVACAINE HCL (PF) 0.5 % IJ SOLN
INTRAMUSCULAR | Status: DC | PRN
  Administered 2022-10-21: 20 mL via PERINEURAL

## 2022-10-21 MED ORDER — BUPIVACAINE LIPOSOME 1.3 % IJ SUSP
INTRAMUSCULAR | Status: DC | PRN
  Administered 2022-10-21: 10 mL via PERINEURAL

## 2022-10-21 MED ORDER — FENTANYL CITRATE (PF) 100 MCG/2ML IJ SOLN
INTRAMUSCULAR | Status: AC
  Filled 2022-10-21: qty 2

## 2022-10-21 MED ORDER — EPHEDRINE 5 MG/ML INJ
INTRAVENOUS | Status: AC
  Filled 2022-10-21: qty 5

## 2022-10-21 MED ORDER — VANCOMYCIN HCL 1000 MG IV SOLR
INTRAVENOUS | Status: AC
  Filled 2022-10-21: qty 20

## 2022-10-21 MED ORDER — METOCLOPRAMIDE HCL 5 MG PO TABS: 5.0000 mg | ORAL_TABLET | Freq: Three times a day (TID) | ORAL | Status: DC | PRN

## 2022-10-21 MED ORDER — OXYCODONE HCL 5 MG/5ML PO SOLN: 5.0000 mg | Freq: Once | ORAL | Status: DC | PRN

## 2022-10-21 MED ORDER — ORAL CARE MOUTH RINSE: 15.0000 mL | Freq: Once | OROMUCOSAL | Status: AC

## 2022-10-21 MED ORDER — ONDANSETRON HCL 4 MG PO TABS: 4.0000 mg | ORAL_TABLET | Freq: Four times a day (QID) | ORAL | Status: DC | PRN

## 2022-10-21 MED ORDER — PROPOFOL 10 MG/ML IV BOLUS
INTRAVENOUS | Status: AC
  Filled 2022-10-21: qty 20

## 2022-10-21 MED ORDER — ONDANSETRON HCL 4 MG PO TABS: 4.0000 mg | ORAL_TABLET | Freq: Three times a day (TID) | ORAL | 0 refills | Status: DC | PRN

## 2022-10-21 MED ORDER — VANCOMYCIN HCL 1000 MG IV SOLR
INTRAVENOUS | Status: DC | PRN
  Administered 2022-10-21: 1000 mg

## 2022-10-21 MED ORDER — FENTANYL CITRATE PF 50 MCG/ML IJ SOSY
50.0000 ug | PREFILLED_SYRINGE | INTRAMUSCULAR | Status: DC
  Administered 2022-10-21: 75 ug via INTRAVENOUS
  Filled 2022-10-21: qty 2

## 2022-10-21 MED ORDER — VANCOMYCIN HCL IN DEXTROSE 1-5 GM/200ML-% IV SOLN
1000.0000 mg | INTRAVENOUS | Status: AC
  Administered 2022-10-21: 1000 mg via INTRAVENOUS
  Filled 2022-10-21: qty 200

## 2022-10-21 MED ORDER — LACTATED RINGERS IV BOLUS
250.0000 mL | Freq: Once | INTRAVENOUS | Status: AC
  Administered 2022-10-21: 250 mL via INTRAVENOUS

## 2022-10-21 MED ORDER — CHLORHEXIDINE GLUCONATE 0.12 % MT SOLN
15.0000 mL | Freq: Once | OROMUCOSAL | Status: AC
  Administered 2022-10-21: 15 mL via OROMUCOSAL

## 2022-10-21 MED ORDER — EPHEDRINE SULFATE-NACL 50-0.9 MG/10ML-% IV SOSY
PREFILLED_SYRINGE | INTRAVENOUS | Status: DC | PRN
  Administered 2022-10-21: 10 mg via INTRAVENOUS

## 2022-10-21 MED ORDER — LACTATED RINGERS IV SOLN: INTRAVENOUS | Status: DC

## 2022-10-21 MED ORDER — LIDOCAINE HCL (PF) 2 % IJ SOLN
INTRAMUSCULAR | Status: AC
  Filled 2022-10-21: qty 5

## 2022-10-21 MED ORDER — LACTATED RINGERS IV BOLUS
500.0000 mL | Freq: Once | INTRAVENOUS | Status: AC
  Administered 2022-10-21: 500 mL via INTRAVENOUS

## 2022-10-21 MED ORDER — 0.9 % SODIUM CHLORIDE (POUR BTL) OPTIME
TOPICAL | Status: DC | PRN
  Administered 2022-10-21: 1000 mL

## 2022-10-21 MED ORDER — MIDAZOLAM HCL 2 MG/2ML IJ SOLN
1.0000 mg | INTRAMUSCULAR | Status: DC
  Administered 2022-10-21: 2 mg via INTRAVENOUS
  Filled 2022-10-21: qty 2

## 2022-10-21 MED ORDER — OXYCODONE HCL 5 MG PO TABS: 5.0000 mg | ORAL_TABLET | Freq: Once | ORAL | Status: DC | PRN

## 2022-10-21 MED ORDER — CEFAZOLIN SODIUM-DEXTROSE 2-4 GM/100ML-% IV SOLN
2.0000 g | INTRAVENOUS | Status: AC
  Administered 2022-10-21: 2 g via INTRAVENOUS
  Filled 2022-10-21: qty 100

## 2022-10-21 MED ORDER — ROCURONIUM BROMIDE 10 MG/ML (PF) SYRINGE
PREFILLED_SYRINGE | INTRAVENOUS | Status: DC | PRN
  Administered 2022-10-21: 70 mg via INTRAVENOUS

## 2022-10-21 MED ORDER — ONDANSETRON HCL 4 MG/2ML IJ SOLN
INTRAMUSCULAR | Status: DC | PRN
  Administered 2022-10-21: 4 mg via INTRAVENOUS

## 2022-10-21 MED ORDER — TIZANIDINE HCL 4 MG PO TABS: 4.0000 mg | ORAL_TABLET | Freq: Three times a day (TID) | ORAL | 1 refills | Status: DC | PRN

## 2022-10-21 MED ORDER — LIDOCAINE 2% (20 MG/ML) 5 ML SYRINGE
INTRAMUSCULAR | Status: DC | PRN
  Administered 2022-10-21: 60 mg via INTRAVENOUS

## 2022-10-21 SURGICAL SUPPLY — 71 items
BAG COUNTER SPONGE SURGICOUNT (BAG) IMPLANT
BAG ZIPLOCK 12X15 (MISCELLANEOUS) ×1 IMPLANT
BLADE SAW SGTL 83.5X18.5 (BLADE) ×1 IMPLANT
BNDG COHESIVE 4X5 TAN STRL LF (GAUZE/BANDAGES/DRESSINGS) ×1 IMPLANT
COOLER ICEMAN CLASSIC (MISCELLANEOUS) ×1 IMPLANT
COVER BACK TABLE 60X90IN (DRAPES) ×1 IMPLANT
COVER SURGICAL LIGHT HANDLE (MISCELLANEOUS) ×1 IMPLANT
CUP SUT UNIV REVERS 36 NEUTRAL (Cup) IMPLANT
DERMABOND ADVANCED .7 DNX12 (GAUZE/BANDAGES/DRESSINGS) ×1 IMPLANT
DERMABOND ADVANCED .7 DNX6 (GAUZE/BANDAGES/DRESSINGS) IMPLANT
DRAPE INCISE IOBAN 66X45 STRL (DRAPES) IMPLANT
DRAPE ORTHO SPLIT 77X108 STRL (DRAPES) ×2
DRAPE SHEET LG 3/4 BI-LAMINATE (DRAPES) ×1 IMPLANT
DRAPE SURG 17X11 SM STRL (DRAPES) ×1 IMPLANT
DRAPE SURG ORHT 6 SPLT 77X108 (DRAPES) ×2 IMPLANT
DRAPE TOP 10253 STERILE (DRAPES) ×1 IMPLANT
DRAPE U-SHAPE 47X51 STRL (DRAPES) ×1 IMPLANT
DRESSING AQUACEL AG SP 3.5X6 (GAUZE/BANDAGES/DRESSINGS) ×1 IMPLANT
DRSG AQUACEL AG ADV 3.5X 6 (GAUZE/BANDAGES/DRESSINGS) IMPLANT
DRSG AQUACEL AG ADV 3.5X10 (GAUZE/BANDAGES/DRESSINGS) IMPLANT
DRSG AQUACEL AG SP 3.5X6 (GAUZE/BANDAGES/DRESSINGS) ×1
DRSG TEGADERM 8X12 (GAUZE/BANDAGES/DRESSINGS) ×1 IMPLANT
DURAPREP 26ML APPLICATOR (WOUND CARE) ×1 IMPLANT
ELECT BLADE TIP CTD 4 INCH (ELECTRODE) ×1 IMPLANT
ELECT PENCIL ROCKER SW 15FT (MISCELLANEOUS) ×1 IMPLANT
ELECT REM PT RETURN 15FT ADLT (MISCELLANEOUS) ×1 IMPLANT
FACESHIELD WRAPAROUND (MASK) ×4 IMPLANT
FACESHIELD WRAPAROUND OR TEAM (MASK) ×4 IMPLANT
GLENOID UNI REV MOD 24 +2 LAT (Joint) IMPLANT
GLENOID UNIV REV SHLD 36 +4 (Shoulder) IMPLANT
GLOVE BIO SURGEON STRL SZ7.5 (GLOVE) ×1 IMPLANT
GLOVE BIO SURGEON STRL SZ8 (GLOVE) ×1 IMPLANT
GLOVE SS BIOGEL STRL SZ 7 (GLOVE) ×1 IMPLANT
GLOVE SS BIOGEL STRL SZ 7.5 (GLOVE) ×1 IMPLANT
GOWN STRL SURGICAL XL XLNG (GOWN DISPOSABLE) ×2 IMPLANT
INSERT HUMERAL UNI REVERS 36 3 (Insert) IMPLANT
KIT BASIN OR (CUSTOM PROCEDURE TRAY) ×1 IMPLANT
KIT TURNOVER KIT A (KITS) IMPLANT
MANIFOLD NEPTUNE II (INSTRUMENTS) ×1 IMPLANT
NDL TAPERED W/ NITINOL LOOP (MISCELLANEOUS) ×1 IMPLANT
NEEDLE TAPERED W/ NITINOL LOOP (MISCELLANEOUS) ×1 IMPLANT
NS IRRIG 1000ML POUR BTL (IV SOLUTION) ×1 IMPLANT
PACK SHOULDER (CUSTOM PROCEDURE TRAY) ×1 IMPLANT
PAD ARMBOARD 7.5X6 YLW CONV (MISCELLANEOUS) ×1 IMPLANT
PAD COLD SHLDR WRAP-ON (PAD) ×1 IMPLANT
PAD ORTHO SHOULDER 7X19 LRG (SOFTGOODS) IMPLANT
PIN NITINOL TARGETER 2.8 (PIN) IMPLANT
PIN SET MODULAR GLENOID SYSTEM (PIN) IMPLANT
RESTRAINT HEAD UNIVERSAL NS (MISCELLANEOUS) ×1 IMPLANT
SCREW CENTRAL MODULAR 25 (Screw) IMPLANT
SCREW PERI LOCK 5.5X16 (Screw) IMPLANT
SCREW PERI LOCK 5.5X24 (Screw) IMPLANT
SCREW PERIPHERAL 5.5X20 LOCK (Screw) IMPLANT
SLING ARM FOAM STRAP LRG (SOFTGOODS) IMPLANT
SLING ARM FOAM STRAP MED (SOFTGOODS) IMPLANT
SPONGE T-LAP 4X18 ~~LOC~~+RFID (SPONGE) ×1 IMPLANT
STEM HUMERAL UNI REVERS SZ6 (Stem) IMPLANT
SUCTION FRAZIER HANDLE 12FR (TUBING) ×1
SUCTION TUBE FRAZIER 12FR DISP (TUBING) ×1 IMPLANT
SUT FIBERWIRE #2 38 T-5 BLUE (SUTURE)
SUT MNCRL AB 3-0 PS2 18 (SUTURE) ×1 IMPLANT
SUT MON AB 2-0 CT1 36 (SUTURE) ×1 IMPLANT
SUT VIC AB 1 CT1 36 (SUTURE) ×1 IMPLANT
SUTURE FIBERWR #2 38 T-5 BLUE (SUTURE) IMPLANT
SUTURE TAPE 1.3 40 TPR END (SUTURE) ×2 IMPLANT
SUTURETAPE 1.3 40 TPR END (SUTURE) ×2
TOWEL OR 17X26 10 PK STRL BLUE (TOWEL DISPOSABLE) ×1 IMPLANT
TOWEL OR NON WOVEN STRL DISP B (DISPOSABLE) ×1 IMPLANT
TUBE SUCTION HIGH CAP CLEAR NV (SUCTIONS) ×1 IMPLANT
WATER STERILE IRR 1000ML POUR (IV SOLUTION) ×2 IMPLANT
YANKAUER SUCT BULB TIP 10FT TU (MISCELLANEOUS) IMPLANT

## 2022-10-21 NOTE — Evaluation (Signed)
Occupational Therapy Evaluation Patient Details Name: Renee [name] MRN: [id number] DOB: 03/17/1946 Today's Date: 10/21/2022   History of Present Illness Renee [name] is s/p a R shoulder reverse arthroplasty on 10-21-2022, due to shoulder rotator cuff tear arthropathy.   Clinical Impression   Pt is s/p shoulder replacement without functional use of non-dominant right upper extremity secondary to effects of surgery and interscalene block and shoulder precautions. Therapist provided education and instruction to patient and her daughter with regards to exercises, precautions, UE positioning, donning upper extremity clothing and bathing while maintaining shoulder precautions, use of ice for pain and edema management and how to donn/doff sling. Patient and her daughter verbalized understanding and demonstrated understanding as needed. Patient needed assistance to donn shirt, pants, and shoes and she was provided with instruction on compensatory strategies to perform ADLs. Patient to follow up with MD for further therapy needs.         Recommendations for follow up therapy are one component of a multi-disciplinary discharge planning process, led by the attending physician.  Recommendations may be updated based on patient status, additional functional criteria and insurance authorization.   Follow Up Recommendations  Follow physician's recommendations for discharge plan and follow up therapies    Assistance Recommended at Discharge Intermittent Supervision/Assistance  Patient can return home with the following Help with stairs or ramp for entrance;Assist for transportation;Assistance with cooking/housework;Direct supervision/assist for medications management;A little help with bathing/dressing/bathroom    Functional Status Assessment  Patient has had a recent decline in their functional status and demonstrates the ability to make significant improvements in function in a reasonable and  predictable amount of time.  Equipment Recommendations  None recommended by OT       Precautions / Restrictions Precautions Precautions: Shoulder Type of Shoulder Precautions: If sitting in controlled environment, ok to come out of sling to give neck a break. Please sleep in it to protect until follow up in office.     OK to use operative arm for feeding, hygiene and ADLs.   Ok to instruct Pendulums and lap slides as exercises. Ok to use operative arm within the following parameters for ADL purposes     New ROM (8/18)   Ok for PROM, AAROM, AROM within pain tolerance and within the following ROM   ER 20   ABD 45   FE 60 Shoulder Interventions: Shoulder sling/immobilizer Precaution Booklet Issued: Yes (comment) Required Braces or Orthoses: Sling Splint/Cast - Date Prophylactic Dressing Applied (if applicable): 18/29/93 Restrictions Weight Bearing Restrictions: Yes RUE Weight Bearing: Non weight bearing      Mobility    Transfers Overall transfer level: Needs assistance   Transfers: Sit to/from Stand Sit to Stand: Supervision           General transfer comment: pt received seated in chair          ADL either performed or assessed with clinical judgement                   Pertinent Vitals/Pain Pain Assessment Pain Assessment: No/denies pain     Hand Dominance Left      Communication Communication Communication: No difficulties   Cognition Arousal/Alertness: Awake/alert Behavior During Therapy: WFL for tasks assessed/performed Overall Cognitive Status: Within Functional Limits for tasks assessed            General Comments: Oriented x4, able to follow commands     Exercises    Shoulder  Instructions Shoulder Instructions Donning/doffing shirt without moving shoulder: Caregiver independent with task Method for sponge bathing under operated UE: Caregiver independent with task Donning/doffing sling/immobilizer: Caregiver independent with task Correct  positioning of sling/immobilizer: Caregiver independent with task Pendulum exercises (written home exercise program): Caregiver independent with task ROM for elbow, wrist and digits of operated UE: Caregiver independent with task Sling wearing schedule (on at all times/off for ADL's): Caregiver independent with task Dressing change: Caregiver independent with task Positioning of UE while sleeping: Caregiver independent with task               Home Living Family/patient expects to be discharged to:: Private residence Living Arrangements: Alone Available Help at Discharge: Family Type of Home: House Home Access: Stairs to enter Technical brewer of Steps: 5   Home Layout: One level              Prior Functioning/Environment Prior Level of Function : Independent/Modified Independent             Mobility Comments: Independent with ambulation. ADLs Comments: Independent with ADLs, cooking, cleaning, and driving.         AM-PAC OT "6 Clicks" Daily Activity     Outcome Measure Help from another person eating meals?: None Help from another person taking care of personal grooming?: None Help from another person toileting, which includes using toliet, bedpan, or urinal?: A Little Help from another person bathing (including washing, rinsing, drying)?: A Little Help from another person to put on and taking off regular upper body clothing?: A Little Help from another person to put on and taking off regular lower body clothing?: A Little 6 Click Score: 20   End of Session Nurse Communication: Mobility status  Activity Tolerance: Patient tolerated treatment well Patient left: in chair;with call bell/phone within reach;with family/visitor present  OT Visit Diagnosis: Muscle weakness (generalized) (M62.81)                Time: 7673-4193 OT Time Calculation (min): 30 min Charges:  OT General Charges $OT Visit: 1 Visit OT Evaluation $OT Eval Low Complexity: 1 Low OT  Treatments $Self Care/Home Management : 8-22 mins    Renee [name], OTR/L 10/21/2022, 5:06 PM

## 2022-10-21 NOTE — Op Note (Signed)
10/21/2022  1:12 PM  PATIENT:   Renee [name]  76 y.o. female  PRE-OPERATIVE DIAGNOSIS:  Right shoulder rotator cuff tear arthropathy  POST-OPERATIVE DIAGNOSIS: Same  PROCEDURE: Right shoulder reverse arthroplasty utilizing a press-fit size 6 Arthrex stem with a neutral metaphysis, +3 constrained polyethylene insert, 36/+4 glenosphere and a small/+2 baseplate  SURGEON:  Blonnie Maske, Metta Clines M.D.  ASSISTANTS: Jenetta Loges, PA-C  Jenetta Loges, PA-C was utilized as an Environmental consultant throughout this case, essential for help with positioning the patient, positioning extremity, tissue manipulation, implantation of the prosthesis, suture management, wound closure, and intraoperative decision-making.  ANESTHESIA:   General endotracheal and interscalene block with Exparel  EBL: 100 cc  SPECIMEN: None  Drains: None   PATIENT DISPOSITION:  PACU - hemodynamically stable.    PLAN OF CARE: Discharge to home after PACU  Brief history:  Renee [name] is a 76 year old female's been followed for chronic and progressively increasing right shoulder pain related to severe osteoarthritis and associated degeneration and dysfunction of the rotator cuff.  Due to her increasing functional limitations and failure to respond to prolonged attempts at conservative management, she is brought to the operating this time for planned right shoulder reverse arthroplasty.   Preoperatively, I counseled the patient regarding treatment options and risks versus benefits thereof.  Possible surgical complications were all reviewed including potential for bleeding, infection, neurovascular injury, persistent pain, loss of motion, anesthetic complication, failure of the implant, and possible need for additional surgery. They understand and accept and agrees with our planned procedure.   Procedure detail:  After undergoing routine preop evaluation the patient received prophylactic antibiotics and interscalene block with Exparel  was established in the holding area by the anesthesia department.  Patient subsequently placed spine on the operating table and underwent smooth induction of a general endotracheal anesthesia.  Placed into the beachchair position and appropriately padded and protected.  The right shoulder girdle region was sterilely prepped and draped in standard fashion.  Timeout was called.  A deltopectoral approach to the right shoulder was made to an approximately 8 cm incision.  Skin flaps were elevated dissection carried deeply and the deltopectoral interval was then developed from proximal to distal with the vein taken laterally.  The conjoined tendon was mobilized and retracted medially.  The long head biceps tendon was then tenodesed at the upper border the pectoralis major tendon with the proximal segment unroofed and excised.  The rotator cuff was then split from the apex of the bicipital groove to the base of the coracoid and the subscapularis was then separated from the lesser tuberosity using electrocautery and the free margin was then tagged with a pair of grasping suture tape sutures.  Capsular attachments were then divided from the anterior and inferior margins of the humeral neck and the humeral head was delivered through the wound.  Extra medullary guide was used to outline the proposed humeral head resection which we performed with an oscillating saw at approximately 20 degrees retroversion.  Marginal osteophytes were removed with a rondure and a metal cap was placed over the cut proximal humeral surface.  The glenoid was then exposed with appropriate retractors and a circumferential labral resection was performed.  Guidepin directed into the center of the glenoid and the glenoid was then reamed with our central followed by the peripheral reamer to a stable subchondral bony bed.  Preparation completed for a 25 mm lag screw using the drill and tapped.  Our baseplate was then assembled.  Vancomycin powder applied  to the threads of the lag screw the baseplate was then inserted with excellent fit and fixation.  The peripheral locking screws were all then placed using standard technique with excellent fixation.  A 36/+4 glenosphere was then impacted onto the baseplate and the central locking screw was placed.  Attention returned to the proximal humerus where the canal was opened hand reaming to size 6 and broaching to size 6 with excellent fit.  Neutral metaphyseal reaming guide was then used to prepare the metaphysis.  A trial implant was placed and trial reduction showed good motion good stability and good soft tissue balance.  This point the trial was removed.  Our final implant was assembled.  The canal was irrigated cleaned and dried with vancomycin powder inserted.  The final implant was then placed and impacted with excellent purchase and fixation.  Trial reductions were then performed and we ultimately felt that a +3 poly gave is the best soft tissue balance we did impacted a +3 constrained polyethylene insert.  Final reduction showed excellent motion stability and soft tissue balance all much to our satisfaction.  The joint was copiously irrigated.  Final hemostasis was obtained.  Good elasticity of the subscapularis was confirmed and it was then repaired back to the eyelets on the collar the implant using the previously placed suture tape sutures.  The deltopectoral interval was reapproximated with a series of figure-of-eight number Vicryl sutures after we had placed the balance of the vancomycin powder liberally throughout the deep soft tissue planes.  The subcu layer was closed with 2-0 Monocryl and intracuticular 3-0 Monocryl used to close the skin followed by Dermabond and Aquacel dressing.  The right arm was then placed into a sling and the patient was awakened, extubated, and taken to the recovery room in stable condition.  Marin Shutter MD  Contact # [phone]

## 2022-10-21 NOTE — Discharge Instructions (Signed)
? ?Renee [name], M.D., F.A.A.O.S. ?Orthopaedic Surgery ?Specializing in Arthroscopic and Reconstructive ?Surgery of the Shoulder ?[phone] ?[address] - [place], [place] [address] - Fax [phone] ? ? ?POST-OP TOTAL SHOULDER REPLACEMENT INSTRUCTIONS ? ?1. Follow up in the office for your first post-op appointment 10-14 days from the date of your surgery. If you do not already have a scheduled appointment, our office will contact you to schedule. ? ?2. The bandage over your incision is waterproof. You may begin showering with this dressing on. You may leave this dressing on until first follow up appointment within 2 weeks. We prefer you leave this dressing in place until follow up however after 5-7 days if you are having itching or skin irritation and would like to remove it you may do so. Go slow and tug at the borders gently to break the bond the dressing has with the skin. At this point if there is no drainage it is okay to go without a bandage or you may cover it with a light guaze and tape. You can also expect significant bruising around your shoulder that will drift down your arm and into your chest wall. This is very normal and should resolve over several days. ? ? 3. Wear your sling/immobilizer at all times except to perform the exercises below or to occasionally let your arm dangle by your side to stretch your elbow. You also need to sleep in your sling immobilizer until instructed otherwise. It is ok to remove your sling if you are sitting in a controlled environment and allow your arm to rest in a position of comfort by your side or on your lap with pillows to give your neck and skin a break from the sling. You may remove it to allow arm to dangle by side to shower. If you are up walking around and when you go to sleep at night you need to wear it. ? ?4. Range of motion to your elbow, wrist, and hand are encouraged 3-5 times daily. Exercise to your hand and fingers helps to reduce  swelling you may experience. ? ? ?5. Prescriptions for a pain medication and a muscle relaxant are provided for you. It is recommended that if you are experiencing pain that you pain medication alone is not controlling, add the muscle relaxant along with the pain medication which can give additional pain relief. The first 1-2 days is generally the most severe of your pain and then should gradually decrease. As your pain lessens it is recommended that you decrease your use of the pain medications to an "as needed basis'" only and to always comply with the recommended dosages of the pain medications. ? ?6. Pain medications can produce constipation along with their use. If you experience this, the use of an over the counter stool softener or laxative daily is recommended.  ? ?7. For additional questions or concerns, please do not hesitate to call the office. If after hours there is an answering service to forward your concerns to the physician on call. ? ?8.Pain control following an exparel block ? ?To help control your post-operative pain you received a nerve block  performed with Exparel which is a long acting anesthetic (numbing agent) which can provide pain relief and sensations of numbness (and relief of pain) in the operative shoulder and arm for up to 3 days. Sometimes it provides mixed relief, meaning you may still have numbness in certain areas of the arm but can still be able to   move  parts of that arm, hand, and fingers. We recommend that your prescribed pain medications  be used as needed. We do not feel it is necessary to "pre medicate" and "stay ahead" of pain.  Taking narcotic pain medications when you are not having any pain can lead to unnecessary and potentially dangerous side effects.   ? ?9. Use the ice machine as much as possible in the first 5-7 days from surgery, then you can wean its use to as needed. The ice typically needs to be replaced every 6 hours, instead of ice you can actually freeze  water bottles to put in the cooler and then fill water around them to avoid having to purchase ice. You can have spare water bottles freezing to allow you to rotate them once they have melted. Try to have a thin shirt or light cloth or towel under the ice wrap to protect your skin.  ? ?FOR ADDITIONAL INFO ON ICE MACHINE AND INSTRUCTIONS GO TO THE WEBSITE AT ? ?https://www.djoglobal.com/products/donjoy/donjoy-iceman-classic3 ? ?10.  We recommend that you avoid any dental work or cleaning in the first 3 months following your joint replacement. This is to help minimize the possibility of infection from the bacteria in your mouth that enters your bloodstream during dental work. We also recommend that you take an antibiotic prior to your dental work for the first year after your shoulder replacement to further help reduce that risk. Please simply contact our office for antibiotics to be sent to your pharmacy prior to dental work. ? ?11. Dental Antibiotics: ? ?We recommend waiting at least 3 months for any dental work even cleanings unless there is a dental emergency. We also recommend  prophylactic antibiotics for all dental procdeures  the first year following your joint replacement. In some exceptions we recommend them to be used lifelong. We will provide you with that prescription in follow up office visits, or you can call our office. ? ?Exceptions are as follows: ? ?1. History of prior total joint infection ? ?2. Severely immunocompromised (Organ Transplant, cancer chemotherapy, Rheumatoid biologic ?meds such as Humera) ? ?3. Poorly controlled diabetes (A1C &gt; 8.0, blood glucose over 200) ? ? ?POST-OP EXERCISES ? ?Pendulum Exercises ? ?Perform pendulum exercises while standing and bending at the waist. Support your uninvolved arm on a table or chair and allow your operated arm to hang freely. Make sure to do these exercises passively - not using you shoulder muscles. These exercises can be performed once your  nerve block effects have worn off. ? ?Repeat 20 times. Do 3 sessions per day. ? ? ?  ?

## 2022-10-21 NOTE — Transfer of Care (Signed)
Immediate Anesthesia Transfer of Care Note  [name]  Procedure(s) Performed: REVERSE SHOULDER ARTHROPLASTY (Right: Shoulder)  Patient Location: PACU  Anesthesia Type:General  Level of Consciousness: awake, alert  and oriented  Airway & Oxygen Therapy: Patient Spontanous Breathing and Patient connected to face mask oxygen  Post-op Assessment: Report given to RN and Post -op Vital signs reviewed and stable  Post vital signs: Reviewed and stable  Last Vitals:  Vitals Value Taken Time  BP    Temp    Pulse 73 10/21/22 1327  Resp 13 10/21/22 1327  SpO2 95 % 10/21/22 1327  Vitals shown include unvalidated device data.  Last Pain:  Vitals:   10/21/22 1047  TempSrc:   PainSc: 4          Complications: No notable events documented.

## 2022-10-21 NOTE — Anesthesia Procedure Notes (Signed)
Anesthesia Regional Block: Interscalene brachial plexus block   Pre-Anesthetic Checklist: , timeout performed,  Correct Patient, Correct Site, Correct Laterality,  Correct Procedure, Correct Position, site marked,  Risks and benefits discussed,  Surgical consent,  Pre-op evaluation,  At surgeon's request and post-op pain management  Laterality: Right  Prep: chloraprep       Needles:  Injection technique: Single-shot  Needle Type: Echogenic Stimulator Needle     Needle Length: 10cm  Needle Gauge: 21   Needle insertion depth: 5 cm   Additional Needles:   Procedures:,,,, ultrasound used (permanent image in chart),,   Motor weakness within 5 minutes.  Narrative:  Start time: 10/21/2022 11:26 AM End time: 10/21/2022 11:32 AM Injection made incrementally with aspirations every 5 mL.  Performed by: Personally  Anesthesiologist: Josephine Igo, MD  Additional Notes: Timeout performed. Patient sedated. Relevant anatomy ID'd using [place]. Incremental 2-27m injection of LA with frequent aspiration. Patient tolerated procedure well.     Right Interscalene Block

## 2022-10-21 NOTE — H&P (Signed)
Renee [name]    Chief Complaint: Right shoulder rotator cuff tear arthropathy HPI: The patient is a 76 y.o. female with chronic and progressively increasing right shoulder pain related to severe osteoarthritis and associated rotator cuff dysfunction.  Due to her increasing functional imitations and failure to respond to prolonged attempts at conservative management, she is brought to the operating room at this time for planned right shoulder reverse arthroplasty  Past Medical History:  Diagnosis Date   Allergy    Anxiety    Arthritis    CAD (coronary artery disease)    non-obstructive. cath 11/11   Claustrophobia    Diabetes mellitus    borderline diabetic - diet controlled    GERD (gastroesophageal reflux disease)    History of chicken pox    History of phobia    clastrophobia   Hyperlipidemia    Hypertension    Hypothyroidism    Kidney stones    hx of    Lichen sclerosus    Psoriasis    Stroke ([place])    Urinary tract infection    hx of       Past Surgical History:  Procedure Laterality Date   APPENDECTOMY     CARDIAC CATHETERIZATION     2011   CARPAL TUNNEL RELEASE     Left trigger finger   CATARACT EXTRACTION W/PHACO Left 06/06/2018   Procedure: CATARACT EXTRACTION PHACO AND INTRAOCULAR LENS PLACEMENT ([place]);  Surgeon: Birder Robson, MD;  Location: [facility] ORS;  Service: Ophthalmology;  Laterality: Left;  [place]  00:23 AP% 16.0 CDE 3.69 Fluid pack lot # [id number]   CATARACT EXTRACTION W/PHACO Right 09/19/2018   Procedure: CATARACT EXTRACTION PHACO AND INTRAOCULAR LENS PLACEMENT (I[facility]);  Surgeon: Birder Robson, MD;  Location: [facility] ORS;  Service: Ophthalmology;  Laterality: Right;  [place] 00:29.5 AP% 17.0 CDE$ 5.03 Fluid pack lot # [id number]   COLONOSCOPY WITH PROPOFOL N/A 05/13/2017   Procedure: COLONOSCOPY WITH PROPOFOL;  Surgeon: Manya Silvas, MD;  Location: [facility] ENDOSCOPY;  Service: Endoscopy;  Laterality: N/A;   ESOPHAGOGASTRODUODENOSCOPY (EGD) WITH PROPOFOL N/A  01/04/2020   Procedure: ESOPHAGOGASTRODUODENOSCOPY (EGD) WITH PROPOFOL;  Surgeon: [name], MD;  Location: [facility] ENDOSCOPY;  Service: Gastroenterology;  Laterality: N/A;   EYE SURGERY  2004   lasik - right eye    JOINT REPLACEMENT     KNEE SURGERY Right 2015   arthroscopy   TOTAL KNEE ARTHROPLASTY Bilateral 06/12/2015   Procedure: TOTAL KNEE BILATERAL;  Surgeon: Gaynelle Arabian, MD;  Location: [facility] ORS;  Service: Orthopedics;  Laterality: Bilateral;  and epidural   TUBAL LIGATION     Tubes tied     and untied. and tied again    Family History  Problem Relation Age of Onset   COPD Mother    Diabetes Mother    Hypertension Mother    Hypertension Father    Diabetes Sister    Hypertension Sister    Diabetes Brother    Arthritis Brother    Heart disease Brother    Arthritis Brother    Diabetes Brother    Heart disease Brother    Stomach cancer Maternal Grandfather    Stroke Maternal Grandfather    Breast cancer Daughter 67       Double mastectomy    Social History:  reports that she has never smoked. She has never used smokeless tobacco. She reports current alcohol use. She reports that she does not use drugs.  BMI: Estimated body mass index is 24.21 kg/m as calculated from  the following:   Height as of this encounter: $RemoveBeforeD'5\' 3"'tqZINRCLBOhxQB$  (1.6 m).   Weight as of this encounter: 62 kg.  Lab Results  Component Value Date   ALBUMIN 4.3 05/31/2022   Diabetes:   Patient has a diagnosis of diabetes,  Lab Results  Component Value Date   HGBA1C 6.0 (H) 10/12/2022   Smoking Status:   reports that she has never smoked. She has never used smokeless tobacco.     Medications Prior to Admission  Medication Sig Dispense Refill   acetaminophen (TYLENOL) 650 MG CR tablet Take 1,300 mg by mouth every 8 (eight) hours as needed for pain.     aspirin EC 81 MG tablet Take 81 mg by mouth daily.     Blood Glucose Monitoring Suppl (ONETOUCH VERIO) w/Device KIT by Does not apply route. Use to check  fasting blood sugar daily and an occasional 2 hr postprandial     Carboxymethylcellulose Sodium (THERATEARS OP) Place 1 drop into both eyes at bedtime as needed (dry eyes).     clobetasol ointment (TEMOVATE) 0.05 % APPLY TOPICALLY DAILY AS NEEDED FOR PSORIASIS FLARES 60 g 0   Coenzyme Q10 (COQ10) 200 MG CAPS Take 200 mg by mouth daily.     Dulaglutide (TRULICITY) 4.65 KP/5.4SF SOPN Inject 0.75 mg into the skin once a week. Via [employer] Cares PAP     Emollient (CERAVE) CREA Apply 1 application  topically 2 (two) times daily as needed (psoriasis).     ezetimibe (ZETIA) 10 MG tablet Take 1 tablet (10 mg total) by mouth daily. 90 tablet 2   famotidine (PEPCID) 20 MG tablet Take 20 mg by mouth daily as needed for heartburn or indigestion.     Flaxseed, Linseed, (FLAX SEED OIL PO) Take 1 capsule by mouth daily.     Lancets (ONETOUCH ULTRASOFT) lancets Use to check fasting blood sugar daily and an occasional 2 hr postprandial 100 each 11   levothyroxine (SYNTHROID) 75 MCG tablet TAKE 1 TABLET BY MOUTH EVERY DAY 90 tablet 3   losartan (COZAAR) 50 MG tablet TAKE 1 TABLET BY MOUTH EVERY DAY 90 tablet 1   MAGNESIUM PO Take 1 tablet by mouth daily.     Multiple Vitamin (MULTIVITAMIN) tablet Take 1 tablet by mouth daily.     nitroGLYCERIN (NITROSTAT) 0.4 MG SL tablet Place 1 tablet (0.4 mg total) under the tongue every 5 (five) minutes as needed. 25 tablet 1   ONETOUCH VERIO test strip USE TO CHECK FASTING BLOOD SUGAR DAILY AND AN OCCASIONAL 2 HR POSTPRANDIAL. 100 strip 11   Skin Protectants, Misc. (EUCERIN) cream Apply 1 application  topically as needed (eczema).     triamcinolone cream (KENALOG) 0.1 % Apply 1 application topically 2 (two) times daily as needed (psoriasis). 454 g 0   Vitamin D, Cholecalciferol, 25 MCG (1000 UT) CAPS Take 2,000 Units by mouth daily.     erythromycin ophthalmic ointment Place 1 application into the left eye 3 (three) times daily. For 5-7 days (Patient not taking: Reported on  10/08/2022) 3.5 g 0   fluticasone (FLONASE) 50 MCG/ACT nasal spray Place 1 spray into both nostrils daily as needed for allergies.     meloxicam (MOBIC) 7.5 MG tablet TAKE 1 TABLET BY MOUTH EVERY DAY 30 tablet 0   neomycin-polymyxin b-dexamethasone (MAXITROL) 3.5-10000-0.1 SUSP Place 1 drop into the left eye 3 (three) times daily. (Patient not taking: Reported on 10/08/2022)       Physical Exam: Right shoulder demonstrates painful and profoundly  restricted motion as noted at recent office visits.  Globally decreased strength to manual muscle testing.  She is otherwise neurovascular intact in the right upper extremity.  Plain radiographs confirm severe osteoarthritis with complete obliteration of the joint space, subchondral sclerosis, and peripheral osteophyte formation and significant bony deformity.  Vitals  Temp:  [98 F (36.7 C)] 98 F (36.7 C) (10/26 1018) Pulse Rate:  [64] 64 (10/26 1018) Resp:  [16] 16 (10/26 1018) BP: (186)/(84) 186/84 (10/26 1018) SpO2:  [98 %] 98 % (10/26 1018) Weight:  [62 kg] 62 kg (10/26 1047)  Assessment/Plan  Impression: Right shoulder rotator cuff tear arthropathy  Plan of Action: Procedure(s): REVERSE SHOULDER ARTHROPLASTY  Chere Babson M Azalya Galyon 10/21/2022, 11:10 AM Contact # [phone]

## 2022-10-21 NOTE — Anesthesia Procedure Notes (Signed)
Procedure Name: Intubation Date/Time: 10/21/2022 12:08 PM  Performed by: Maxwell Caul, CRNAPre-anesthesia Checklist: Patient identified, Emergency Drugs available, Suction available and Patient being monitored Patient Re-evaluated:Patient Re-evaluated prior to induction Oxygen Delivery Method: Circle system utilized Preoxygenation: Pre-oxygenation with 100% oxygen Induction Type: IV induction Ventilation: Mask ventilation without difficulty Laryngoscope Size: Mac and 4 Grade View: Grade I Tube type: Oral Tube size: 7.0 mm Number of attempts: 1 Airway Equipment and Method: Stylet Placement Confirmation: ETT inserted through vocal cords under direct vision, positive ETCO2 and breath sounds checked- equal and bilateral Secured at: 20 cm Tube secured with: Tape Dental Injury: Teeth and Oropharynx as per pre-operative assessment

## 2022-10-21 NOTE — Anesthesia Postprocedure Evaluation (Signed)
Anesthesia Post Note  [name]  Procedure(s) Performed: REVERSE SHOULDER ARTHROPLASTY (Right: Shoulder)     Patient location during evaluation: PACU Anesthesia Type: General Level of consciousness: awake and alert and oriented Pain management: pain level controlled Vital Signs Assessment: post-procedure vital signs reviewed and stable Respiratory status: spontaneous breathing, nonlabored ventilation and respiratory function stable Cardiovascular status: blood pressure returned to baseline and stable Postop Assessment: no apparent nausea or vomiting Anesthetic complications: no   No notable events documented.  Last Vitals:  Vitals:   10/21/22 1400 10/21/22 1415  BP: (!) 191/76 (!) 189/104  Pulse: 66 63  Resp: 13 20  Temp:    SpO2: 95% 93%    Last Pain:  Vitals:   10/21/22 1415  TempSrc:   PainSc: 0-No pain                 Mikka Kissner A.

## 2022-10-22 ENCOUNTER — Encounter (HOSPITAL_COMMUNITY): Payer: Self-pay | Admitting: Orthopedic Surgery

## 2022-10-25 ENCOUNTER — Telehealth: Payer: Self-pay

## 2022-10-25 NOTE — Progress Notes (Cosign Needed Addendum)
Chronic Care Management Pharmacy Assistant   Name: Renee [name]  MRN: [id number] DOB: 20-Jun-1946  Reason for Encounter: CCM (Appointment Reminder)   Medications: Outpatient Encounter Medications as of 10/25/2022  Medication Sig Note   acetaminophen (TYLENOL) 650 MG CR tablet Take 1,300 mg by mouth every 8 (eight) hours as needed for pain.    aspirin EC 81 MG tablet Take 81 mg by mouth daily.    Blood Glucose Monitoring Suppl (ONETOUCH VERIO) w/Device KIT by Does not apply route. Use to check fasting blood sugar daily and an occasional 2 hr postprandial    Carboxymethylcellulose Sodium (THERATEARS OP) Place 1 drop into both eyes at bedtime as needed (dry eyes).    clobetasol ointment (TEMOVATE) 0.05 % APPLY TOPICALLY DAILY AS NEEDED FOR PSORIASIS FLARES    Coenzyme Q10 (COQ10) 200 MG CAPS Take 200 mg by mouth daily.    Dulaglutide (TRULICITY) 3.30 QT/6.2UQ SOPN Inject 0.75 mg into the skin once a week. Via [employer] Cares PAP    Emollient (CERAVE) CREA Apply 1 application  topically 2 (two) times daily as needed (psoriasis). 11/17/2016: Mix with triamcinolone   erythromycin ophthalmic ointment Place 1 application into the left eye 3 (three) times daily. For 5-7 days (Patient not taking: Reported on 10/08/2022)    ezetimibe (ZETIA) 10 MG tablet Take 1 tablet (10 mg total) by mouth daily.    famotidine (PEPCID) 20 MG tablet Take 20 mg by mouth daily as needed for heartburn or indigestion.    Flaxseed, Linseed, (FLAX SEED OIL PO) Take 1 capsule by mouth daily.    fluticasone (FLONASE) 50 MCG/ACT nasal spray Place 1 spray into both nostrils daily as needed for allergies.    HYDROcodone-acetaminophen (NORCO/VICODIN) 5-325 MG tablet Take 1 tablet by mouth every 4 (four) hours as needed for moderate pain.    Lancets (ONETOUCH ULTRASOFT) lancets Use to check fasting blood sugar daily and an occasional 2 hr postprandial    levothyroxine (SYNTHROID) 75 MCG tablet TAKE 1 TABLET BY MOUTH EVERY DAY     losartan (COZAAR) 50 MG tablet TAKE 1 TABLET BY MOUTH EVERY DAY    MAGNESIUM PO Take 1 tablet by mouth daily.    meloxicam (MOBIC) 7.5 MG tablet TAKE 1 TABLET BY MOUTH EVERY DAY    Multiple Vitamin (MULTIVITAMIN) tablet Take 1 tablet by mouth daily.    neomycin-polymyxin b-dexamethasone (MAXITROL) 3.5-10000-0.1 SUSP Place 1 drop into the left eye 3 (three) times daily. (Patient not taking: Reported on 10/08/2022)    nitroGLYCERIN (NITROSTAT) 0.4 MG SL tablet Place 1 tablet (0.4 mg total) under the tongue every 5 (five) minutes as needed.    ondansetron (ZOFRAN) 4 MG tablet Take 1 tablet (4 mg total) by mouth every 8 (eight) hours as needed for nausea or vomiting.    ONETOUCH VERIO test strip USE TO CHECK FASTING BLOOD SUGAR DAILY AND AN OCCASIONAL 2 HR POSTPRANDIAL.    Skin Protectants, Misc. (EUCERIN) cream Apply 1 application  topically as needed (eczema).    tiZANidine (ZANAFLEX) 4 MG tablet Take 1 tablet (4 mg total) by mouth every 8 (eight) hours as needed for muscle spasms.    triamcinolone cream (KENALOG) 0.1 % Apply 1 application topically 2 (two) times daily as needed (psoriasis).    Vitamin D, Cholecalciferol, 25 MCG (1000 UT) CAPS Take 2,000 Units by mouth daily.    No facility-administered encounter medications on file as of 10/25/2022.   Renee [name] was contacted to remind of upcoming telephone  visit with Renee [name] on 10/28/2022 at 11:00. Patient was reminded to have any blood glucose and blood pressure readings available for review at appointment.   Patient confirmed appointment. Spoke with Renee [name] (daughter) for encounter. Call patient's cell [name] [phone] for encounter.    Please update patient chart to reflect patient cell [name] as [phone]. Patient's daughter Renee [name] (for appointment changes) is [phone].   Are you having any problems with your medications? No   Do you have any concerns you like to discuss with the pharmacist? No  CCM  referral has been placed prior to visit?  Yes   Star Rating Drugs: Medication:  Last Fill: Day Supply Trulicity 0.44 mg         PAP Losartan 50 mg           10/03/22          [place], CPP notified  Renee [name], [place] Pharmacy Assistant [phone]

## 2022-10-28 ENCOUNTER — Ambulatory Visit: Payer: Medicare Other | Admitting: Pharmacist

## 2022-10-28 DIAGNOSIS — E1169 Type 2 diabetes mellitus with other specified complication: Secondary | ICD-10-CM

## 2022-10-28 DIAGNOSIS — E1159 Type 2 diabetes mellitus with other circulatory complications: Secondary | ICD-10-CM

## 2022-10-28 DIAGNOSIS — Z789 Other specified health status: Secondary | ICD-10-CM

## 2022-10-28 DIAGNOSIS — I152 Hypertension secondary to endocrine disorders: Secondary | ICD-10-CM

## 2022-10-28 NOTE — Patient Instructions (Signed)
Visit Information  Phone number for Pharmacist: [phone]   Goals Addressed   None     Care Plan : [place]  Updates made by Charlton Haws, RPH since 10/28/2022 12:00 AM     Problem: Hypertension, Hyperlipidemia, and Diabetes   Priority: High     Long-Range Goal: Disease Management   Start Date: 05/11/2021  Expected End Date: 04/29/2023  This Visit's Progress: On track  Recent Progress: On track  Priority: High  Note:   Current Barriers:  Postoperative pain  Pharmacist Clinical Goal(s):  Patient will contact provider office for questions/concerns as evidenced notation of same in electronic health record through collaboration with PharmD and provider.   Interventions: 1:1 collaboration with Jinny Sanders, MD regarding development and update of comprehensive plan of care as evidenced by provider attestation and co-signature Inter-disciplinary care team collaboration (see longitudinal plan of care) Comprehensive medication review performed; medication list updated in electronic medical record  Hypertension (BP goal <140/90) -Controlled -Current home readings: 113/66-128/78 -Current treatment: Losartan 50 mg daily - Appropriate, Effective, Safe, Accessible -Medications previously tried: none  -Counseled to monitor BP at home weekly or when symptomatic, with an arm cuff, document, and call if above 140/90 -Recommended to continue current medication   Hyperlipidemia: (LDL goal < 100) -Adequate control - LDL 105 (05/2022 - worsened from 85 previously); pt reports she is taking ezetimibe, she fills it through GoodRx at [facility] -Current treatment: Ezetimibe 10 mg daily - Appropriate, Query Effective Aspirin 81 mg daily - Appropriate, Effective, Safe, Accessible Flaxseed Oil (Omega 3-6-9) daily - Appropriate, Effective, Safe, Accessible Coenzyme Q10 -Medications previously tried: Gemfibrozil (myopathy), simvastatin (swelling, rash), rosuvastatin  (rash) -Recommended to continue current medication; repeat lipid panel at upcoming CPE  Diabetes (A1c goal <7%) -Controlled -A1c 6.0%(09/2022) at goal; she has noticed improvement in BG as her pain has improved. -She denies any recent courses of prednisone or injections. She is having a lot less lows off glipizide. No longer feeling weak. She has really enjoyed the Trulicity. -Current home glucose readings - 100-130 -Current medications: Trulicity 0.24 mg once weekly (PAP) - Appropriate, Effective, Safe, Accessible -Medications previously tried: glipizide, metformin (N/V) -Diet - continues to limit breads and sweets, tries to eat vegetables often -Recommended to continue current medication  Arthritis/Shoulder Pain -Not ideally controlled   -s/p shoulder surgery 10/21/22, pt reports she is in a fair amount of pain and sleep is disrupted; she has 2 Norco left and requests more. -Current treatment  Hydrocodone-APAP 5-325 mg PRN (#20 10/21/22) Tizanidine 4 mg - not taking Tylenol 650 mg - 2 tab daily -Medications previously tried: n/a  -Advised pt to call surgeon for [place] refills  Patient Goals/Self-Care Activities Patient will:  - check blood glucose daily and keep log  - check blood pressure weekly and keep log  - call [facility] about hydrocodone      Patient verbalizes understanding of instructions and care plan provided today and agrees to view in [place]. Active MyChart status and patient understanding of how to access instructions and care plan via MyChart confirmed with patient.    Telephone follow up appointment with pharmacy team member scheduled for: PRN  Charlene Brooke, PharmD, BCACP Clinical Pharmacist [facility] [phone]

## 2022-10-28 NOTE — Progress Notes (Signed)
Chronic Care Management Pharmacy Note  10/28/2022 Name:  Renee [name] MRN:  [id number] DOB:  1946/02/18  Summary: CCM F/U visit -Pt had shoulder surgery 10/26, she has been in a fair amount of pain; she was given 75 Norco and has 2 left, requests refill; she has f/u with surgeon 11/8 -DM: A1c 6.0 (09/2022) at goal; pt is stable on Trulicity which she receives through PAP -HTN: BP stable at home < 130/80 -HLD: LDL 105 (05/2022 - worsened from 23 previously); pt has not tolerated statins and continued on ezetimibe  Recommendations/Changes made from today's visit: -Advised to contact [facility] for Hydrocodone refill -Repeat lipid panel at upcoming CPE   Plan: -[place] will follow up 1 month re: Trulicity PAP renewal -Pharmacist follow up televisit PRN -PCP CPE 12/07/22    Subjective: Renee [name] is an 76 y.o. year old female who is a primary patient of Bedsole, Amy E, MD.  The CCM team was consulted for assistance with disease management and care coordination needs.    Engaged with patient by telephone for follow up visit in response to provider referral for pharmacy case management and/or care coordination services.   Consent to Services:  The patient was given information about Chronic Care Management services, agreed to services, and gave verbal consent prior to initiation of services.  Please see initial visit note for detailed documentation.   Patient Care Team: Renee Sanders, MD as PCP - General (Family Medicine) Renee Arabian, MD as Consulting Physician (Orthopedic Surgery) Renee Merritts, MD as Consulting Physician (Cardiology) Dasher, Rayvon Char, MD as Consulting Physician (Dermatology) Renee Robson, MD as Referring Physician (Ophthalmology) Renee [name], Renee [name] as Pharmacist (Pharmacist)  Recent office visits: 06/04/22 Dr Renee [name] OV: f/u - lipids worsening. Statin intolerant. Work on diet/exercise. Recheck 3-6 months.  02/17/22 NP Renee [name] OV: hordeolum L eyelid. Start Erythromycin ointment x 5-7 days.  12/01/21 Dr Renee [name] OV: annual exam. Trial meloxicam for shoulder pain. Restart glipizide until sugars decrease. Schedule mammogram/DEXA.  Recent consult visits: 03/16/22 Dr Renee [name] (Neurology) f/u [facility], stable. LDL goal < 70.   02/25/22 Dr Renee [name] (OB/GYN): f/u lichen sclerosus; advised to continue ointment 1-2x weekly to prevent recurrence.  01/11/22 Dr Renee [name] (OB/GYN): F/u lichen sclrosus. Apply steroid ointment daily.  12/11/21 Dr Renee [name] (cardiology): f/u CAD. Discussed PCSK9-inhibitor. For now monitor as weight loss continues.  Hospital visits: 10/21/22 admission - shoulder arthroplasty.    Objective:  Lab Results  Component Value Date   CREATININE 0.57 10/12/2022   BUN 16 10/12/2022   GFR 83.82 05/31/2022   GFRNONAA >60 10/12/2022   GFRAA >60 03/28/2018   NA 140 10/12/2022   K 4.4 10/12/2022   CALCIUM 9.5 10/12/2022   CO2 26 10/12/2022   GLUCOSE 105 (H) 10/12/2022    Lab Results  Component Value Date/Time   HGBA1C 6.0 (H) 10/12/2022 12:06 PM   HGBA1C 6.5 05/31/2022 11:54 AM   GFR 83.82 05/31/2022 11:54 AM   GFR 84.28 08/14/2021 11:07 AM    Last diabetic Eye exam:  Lab Results  Component Value Date/Time   HMDIABEYEEXA No Retinopathy 05/26/2022 12:00 AM    Last diabetic Foot exam:  Lab Results  Component Value Date/Time   HMDIABFOOTEX done 05/31/2022 12:00 AM     Lab Results  Component Value Date   CHOL 184 05/31/2022   HDL 44.70 05/31/2022   LDLCALC 105 (H) 05/31/2022   TRIG 169.0 (H) 05/31/2022   CHOLHDL 4 05/31/2022  Latest Ref Rng & Units 05/31/2022   11:54 AM 08/14/2021   11:07 AM 06/17/2021    7:08 PM  Hepatic Function  Total Protein 6.0 - 8.3 g/dL 7.2  7.2  7.5   Albumin 3.5 - 5.2 g/dL 4.3  4.2  4.2   AST 0 - 37 U/L _0 ALT 0 - 35 U/L _1 Alk Phosphatase 39 - 117 U/L 55  59  56   Total Bilirubin 0.2 - 1.2 mg/dL 0.3  0.4  0.6     Lab Results   Component Value Date/Time   TSH 2.49 05/31/2022 11:54 AM   TSH 2.67 11/27/2021 03:25 PM   FREET4 0.95 05/31/2022 11:54 AM   FREET4 1.3 11/27/2021 03:25 PM       Latest Ref Rng & Units 10/12/2022   12:06 PM 06/17/2021    7:33 PM 06/17/2021    7:08 PM  CBC  WBC 4.0 - 10.5 K/uL 6.5   7.9   Hemoglobin 12.0 - 15.0 g/dL 12.3  15.6  14.4   Hematocrit 36.0 - 46.0 % 38.9  46.0  45.2   Platelets 150 - 400 K/uL 348   374     Lab Results  Component Value Date/Time   VD25OH 45.47 05/31/2022 11:54 AM   VD25OH 43.32 10/07/2020 12:20 PM    Clinical ASCVD: Yes  The 10-year ASCVD risk score (Arnett DK, et al., 2019) is: 55.8%   Values used to calculate the score:     Age: 63 years     Sex: Female     Is Non-Hispanic African American: No     Diabetic: Yes     Tobacco smoker: No     Systolic Blood Pressure: 945 mmHg     Is BP treated: Yes     HDL Cholesterol: 44.7 mg/dL     Total Cholesterol: 184 mg/dL       12/01/2021    3:17 PM 11/30/2021    1:27 PM 05/29/2021   11:24 AM  Depression screen PHQ 2/9  Decreased Interest 0 0 0  Down, Depressed, Hopeless 2 0 0  PHQ - 2 Score 2 0 0  Altered sleeping 1  0  Tired, decreased energy 1  1  Change in appetite 0  0  Feeling bad or failure about yourself  0  0  Trouble concentrating 0  0  Moving slowly or fidgety/restless 0  0  Suicidal thoughts 0  0  PHQ-9 Score 4  1  Difficult doing work/chores Somewhat difficult  Not difficult at all     Social History   Tobacco Use  Smoking Status Never  Smokeless Tobacco Never   BP Readings from Last 3 Encounters:  10/21/22 (!) 146/62  10/12/22 (!) 160/69  06/04/22 120/70   Pulse Readings from Last 3 Encounters:  10/21/22 63  10/12/22 67  06/04/22 86   Wt Readings from Last 3 Encounters:  10/21/22 136 lb 11 oz (62 kg)  10/12/22 138 lb (62.6 kg)  06/04/22 140 lb (63.5 kg)   BMI Readings from Last 3 Encounters:  10/21/22 24.21 kg/m  10/12/22 24.45 kg/m  06/04/22 25.00 kg/m     Assessment/Interventions: Review of patient past medical history, allergies, medications, health status, including review of consultants reports, laboratory and other test data, was performed as part of comprehensive evaluation and provision of chronic care management services.   SDOH:  (Social Determinants of Health) assessments and interventions  performed: No - done Dec 2022 AWV SDOH Interventions    Flowsheet Row Chronic Care Management from 12/08/2021 in [place] at [place] [facility] from 06/18/2021 in [place] at [place] [facility] from 05/11/2021 in [place] [facility] [place] from 10/07/2020 in [place] at [place]  SDOH Interventions      Depression Interventions/Treatment  -- -- -- Currently on Treatment  Financial Strain Interventions Intervention Not Indicated Intervention Not Indicated Intervention Not Indicated --      [place]: No Food Insecurity (11/30/2021)  Housing: Low Risk  (11/30/2021)  Transportation Needs: No Transportation Needs (11/30/2021)  Alcohol Screen: Low Risk  (11/30/2021)  Depression (PHQ2-9): Low Risk  (12/01/2021)  Financial Resource Strain: Low Risk  (12/08/2021)  Physical Activity: Sufficiently Active (11/30/2021)  Social Connections: Socially Isolated (11/30/2021)  Stress: No Stress Concern Present (11/30/2021)  Tobacco Use: Low Risk  (10/22/2022)    CCM Care Plan  Allergies  Allergen Reactions   Celexa [Citalopram] Shortness Of Breath and Swelling    Throat swelling and difficulty breathing   Other Rash    Metal    Codeine Nausea And Vomiting   Metformin Nausea And Vomiting   Robaxin [Methocarbamol] Nausea Only   Crestor [Rosuvastatin] Rash   Nickel Rash   Simvastatin Swelling and Rash    Medications Reviewed Today     Reviewed by Lysle Pearl, RN (Registered Nurse) on 10/21/22 at [place] List Status: Complete    Medication Order Taking? Sig Documenting Provider Last Dose Status Informant  acetaminophen (TYLENOL) 650 MG CR tablet [id number] Yes Take 1,300 mg by mouth every 8 (eight) hours as needed for pain. [provider] 10/21/2022 0800 Active Family Member  aspirin EC 81 MG tablet [id number] Yes Take 81 mg by mouth daily. [provider] 10/15/2022 Active Family Member  Blood Glucose Monitoring Suppl [facility] VERIO) w/Device KIT [id number] Yes by Does not apply route. Use to check fasting blood sugar daily and an occasional 2 hr postprandial [provider] 10/21/2022 Active Family Member  Carboxymethylcellulose Sodium (THERATEARS OP) [id number] Yes Place 1 drop into both eyes at bedtime as needed (dry eyes). [provider] Past Week Active Family Member  clobetasol ointment (TEMOVATE) 0.05 % [id number] Yes APPLY TOPICALLY DAILY AS NEEDED FOR PSORIASIS FLARES Bedsole, Amy E, MD Past Month Active Family Member  Coenzyme Q10 (COQ10) 200 MG CAPS [id number] Yes Take 200 mg by mouth daily. [provider] 10/20/2022 Active Family Member  Dulaglutide (TRULICITY) 7.10 GY/6.9SW SOPN [id number] Yes Inject 0.75 mg into the skin once a week. Via [employer] PAP Renee Sanders, MD 10/10/2022 Active Family Member  Emollient [facility] [name]) CREA [id number] Yes Apply 1 application  topically 2 (two) times daily as needed (psoriasis). [provider] Past Month Active Family Member           Med Note Mabeline Caras   Wed Nov 17, 2016  8:12 AM) Mix with triamcinolone  erythromycin ophthalmic ointment [id number] No Place 1 application into the left eye 3 (three) times daily. For 5-7 days  Patient not taking: Reported on 10/08/2022   Michela Pitcher, NP Not Taking Active Family Member  ezetimibe (ZETIA) 10 MG tablet [id number] Yes Take 1 tablet (10 mg total) by mouth daily. Renee Sanders, MD 10/21/2022 0800 Active Family Member  famotidine (PEPCID) 20 MG tablet [id number] Yes Take  20 mg by mouth daily as needed for heartburn  or indigestion. [provider] 10/21/2022 0800 Active Family Member  Flaxseed, Linseed, (FLAX SEED OIL PO) [id number] Yes Take 1 capsule by mouth daily. [provider] 10/20/2022 Active Family Member  fluticasone (FLONASE) 50 MCG/ACT nasal spray [id number] No Place 1 spray into both nostrils daily as needed for allergies. [provider] More than a month Active Family Member  Lancets [facility] ULTRASOFT) lancets [id number] Yes Use to check fasting blood sugar daily and an occasional 2 hr postprandial Renee Sanders, MD 10/20/2022 Active Family Member  levothyroxine (SYNTHROID) 75 MCG tablet [id number] Yes TAKE 1 TABLET BY MOUTH EVERY DAY Renee Sanders, MD 10/21/2022 0800 Active Family Member  losartan (COZAAR) 50 MG tablet [id number] Yes TAKE 1 TABLET BY MOUTH EVERY DAY Renee Sanders, MD 10/20/2022 Active Family Member  MAGNESIUM PO [id number] Yes Take 1 tablet by mouth daily. [provider] 10/20/2022 Active Family Member  meloxicam (MOBIC) 7.5 MG tablet [id number] No TAKE 1 TABLET BY MOUTH EVERY DAY Bedsole, Amy E, MD More than a month Active Family Member  Multiple Vitamin (MULTIVITAMIN) tablet [id number] Yes Take 1 tablet by mouth daily. [provider] 10/20/2022 Active Family Member  neomycin-polymyxin b-dexamethasone (MAXITROL) 3.5-10000-0.1 SUSP [id number] No Place 1 drop into the left eye 3 (three) times daily.  Patient not taking: Reported on 10/08/2022   [provider] Not Taking Active Family Member  nitroGLYCERIN (NITROSTAT) 0.4 MG SL tablet [id number] Yes Place 1 tablet (0.4 mg total) under the tongue every 5 (five) minutes as needed. Renee Merritts, MD never used Active Family Member  [facility] VERIO test strip [id number] Yes USE TO CHECK FASTING BLOOD SUGAR DAILY AND AN OCCASIONAL 2 HR POSTPRANDIAL. Renee Sanders, MD 10/20/2022 Active Family Member  Skin Protectants, Misc. (EUCERIN) cream  [id number] Yes Apply 1 application  topically as needed (eczema). [provider] Past Month Active Family Member  triamcinolone cream (KENALOG) 0.1 % [id number] Yes Apply 1 application topically 2 (two) times daily as needed (psoriasis). Renee Sanders, MD 10/20/2022 Active Family Member  Vitamin D, Cholecalciferol, 25 MCG (1000 UT) CAPS [id number] Yes Take 2,000 Units by mouth daily. 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   Hordeolum externum of left upper eyelid 02/17/2022   Acute gout involving toe of right foot 08/12/2021   Chronic pain of both shoulders 08/12/2021   History of TIA (transient ischemic attack) 06/26/2021   Statin intolerance 01/15/2021   MDD (major depressive disorder), recurrent episode, moderate (HCC) 07/08/2020   Chronic insomnia 07/08/2020   Chronic neck pain 10/05/2019   Chronic diarrhea 03/30/2018   GAD (generalized anxiety disorder) 01/31/2018   Osteopenia 12/08/2017   Vitamin D deficiency 03/04/2016   Allergic rhinitis 03/04/2016   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)  ([place]) 10/20/2015   External hemorrhoid 10/20/2015   Psoriasis 10/20/2015   History of bilateral knee replacement 10/20/2015   Osteoarthritis of knee 11/25/2014   Hypertension associated with diabetes ([place]) 11/06/2010   CAD, NATIVE VESSEL 11/06/2010   Hyperlipidemia associated with type 2 diabetes mellitus ([place]) 01/25/2006   Adult hypothyroidism 11/15/2005    Immunization History  Administered Date(s) Administered   H1N1 11/02/2008   Influenza Split 10/22/2006, 10/28/2007, 09/26/2008, 09/13/2009, 09/12/2010, 09/18/2011, 09/27/2012   Influenza, High Dose Seasonal PF 09/11/2016, 08/28/2019, 09/22/2020, 10/03/2021   Influenza,inj,Quad PF,6+ Mos 09/13/2013, 09/13/2014, 09/20/2017,  09/25/2018   Influenza-Unspecified 08/27/2014, 09/18/2015    PFIZER(Purple Top)SARS-COV-2 Vaccination 01/03/2020, 01/26/2020, 11/05/2020   Pneumococcal Conjugate-13 04/05/2014   Pneumococcal Polysaccharide-23 09/26/2008, 02/08/2017   Tdap 09/27/2012    Conditions to be addressed/monitored:  Hypertension, Hyperlipidemia, and Diabetes  Care Plan : [place]  Updates made by Renee [name], [place] since 10/28/2022 12:00 AM     Problem: Hypertension, Hyperlipidemia, and Diabetes   Priority: High     Long-Range Goal: Disease Management   Start Date: 05/11/2021  Expected End Date: 04/29/2023  This Visit's Progress: On track  Recent Progress: On track  Priority: High  Note:   Current Barriers:  Postoperative pain  Pharmacist Clinical Goal(s):  Patient will contact provider office for questions/concerns as evidenced notation of same in electronic health record through collaboration with PharmD and provider.   Interventions: 1:1 collaboration with Renee Sanders, MD regarding development and update of comprehensive plan of care as evidenced by provider attestation and co-signature Inter-disciplinary care team collaboration (see longitudinal plan of care) Comprehensive medication review performed; medication list updated in electronic medical record  Hypertension (BP goal <140/90) -Controlled -Current home readings: 113/66-128/78 -Current treatment: Losartan 50 mg daily - Appropriate, Effective, Safe, Accessible -Medications previously tried: none  -Counseled to monitor BP at home weekly or when symptomatic, with an arm cuff, document, and call if above 140/90 -Recommended to continue current medication   Hyperlipidemia: (LDL goal < 70) -Adequate control - LDL 84 (07/2021); pt reports she is taking ezetimibe, she fills it through [facility]Rx at [facility] -Current treatment: Ezetimibe 10 mg daily - Appropriate, Effective, Safe, Accessible Aspirin 81 mg daily - Appropriate, Effective, Safe, Accessible Flaxseed Oil (Omega 3-6-9)  daily - Appropriate, Effective, Safe, Accessible Coenzyme Q10 -Medications previously tried: Gemfibrozil (myopathy), simvastatin (swelling, rash), rosuvastatin (rash) -Recommended to continue current medication  Diabetes (A1c goal <7%) -Controlled -A1c 6.0%(09/2022) at goal; she has noticed improvement in BG as her pain has improved. -She denies any recent courses of prednisone or injections. She is having a lot less lows off glipizide. No longer feeling weak. She has really enjoyed the Trulicity. -Current home glucose readings - 100-130 -Current medications: Trulicity 9.47 mg once weekly (PAP) - Appropriate, Effective, Safe, Accessible -Medications previously tried: glipizide, metformin (N/V) -Diet - continues to limit breads and sweets, tries to eat vegetables often -Recommended to continue current medication  Arthritis/Shoulder Pain -Not ideally controlled   -s/p shoulder surgery 10/21/22, pt reports she is in a fair amount of pain and sleep is disrupted; she has 2 Norco left and requests more. -Current treatment  Hydrocodone-APAP 5-325 mg PRN (#20 10/21/22) Tizanidine 4 mg - not taking Tylenol 650 mg - 2 tab daily -Medications previously tried: n/a  -Advised pt to call surgeon for [place] refills  Patient Goals/Self-Care Activities Patient will:  - check blood glucose daily and keep log  - check blood pressure weekly and keep log  - call [facility] about hydrocodone     Medication Assistance:  Trulicity [employer]) - 2023 approval  Compliance/Adherence/Medication fill history: Care Gaps: UACR due  Star-Rating Drugs: Losartan - PDC 100%  Medication Access: Within the past 30 days, how often has patient missed a dose of medication? 0 Is a pillbox or other method used to improve adherence? Yes  Factors that may affect medication adherence? no barriers identified Are meds synced by current pharmacy? No  Are meds delivered by current pharmacy? No  Does patient  experience delays in picking up medications due to  transportation concerns? No   [facility] Services Reviewed: Is patient disadvantaged to use [facility]?: Yes  Current Rx insurance plan: [facility] Name and location of Current pharmacy:  [id number] [place] [address]: [phone]: [phone]-[place] [address]Phone: [phone]: [phone] [facility] services reviewed with patient today?: No  Patient requests to transfer care to [facility]?: No  Reason patient declined to change pharmacies: Disadvantaged due to insurance/mail order   Care Plan and Follow Up Patient Decision:  Patient agrees to Care Plan and Follow-up.  Plan: Telephone follow up appointment with care management team member scheduled for:  6 months  LCharlene Brooke PharmD, BCACP Clinical Pharmacist [phone]

## 2022-11-03 DIAGNOSIS — M25512 Pain in left shoulder: Secondary | ICD-10-CM | POA: Insufficient documentation

## 2022-11-03 DIAGNOSIS — Z96611 Presence of right artificial shoulder joint: Secondary | ICD-10-CM | POA: Diagnosis not present

## 2022-11-03 DIAGNOSIS — Z471 Aftercare following joint replacement surgery: Secondary | ICD-10-CM | POA: Diagnosis not present

## 2022-11-03 DIAGNOSIS — Z96619 Presence of unspecified artificial shoulder joint: Secondary | ICD-10-CM | POA: Diagnosis not present

## 2022-11-04 ENCOUNTER — Ambulatory Visit: Payer: Medicare Other | Admitting: Podiatry

## 2022-11-16 ENCOUNTER — Ambulatory Visit: Payer: Medicare Other | Attending: Orthopedic Surgery | Admitting: Physical Therapy

## 2022-11-16 ENCOUNTER — Encounter: Payer: Self-pay | Admitting: Physical Therapy

## 2022-11-16 DIAGNOSIS — M25511 Pain in right shoulder: Secondary | ICD-10-CM | POA: Diagnosis not present

## 2022-11-16 DIAGNOSIS — M25611 Stiffness of right shoulder, not elsewhere classified: Secondary | ICD-10-CM | POA: Diagnosis not present

## 2022-11-16 DIAGNOSIS — M6281 Muscle weakness (generalized): Secondary | ICD-10-CM | POA: Insufficient documentation

## 2022-11-16 NOTE — Therapy (Signed)
OUTPATIENT PHYSICAL THERAPY SHOULDER EVALUATION   Patient Name: Renee [name] MRN: [id number] DOB:1946/10/24, 76 y.o., female Today's Date: 11/17/2022   PT End of Session - 11/17/22 0926     Visit Number 1    Number of Visits 17    Date for PT Re-Evaluation 01/13/23    Authorization Type Medicare 2023    Progress Note Due on Visit 10    PT Start Time 5027    PT Stop Time 1602    PT Time Calculation (min) 47 min    Activity Tolerance No increased pain    Behavior During Therapy WFL for tasks assessed/performed             Past Medical History:  Diagnosis Date   Allergy    Anxiety    Arthritis    CAD (coronary artery disease)    non-obstructive. cath 11/11   Claustrophobia    Diabetes mellitus    borderline diabetic - diet controlled    GERD (gastroesophageal reflux disease)    History of chicken pox    History of phobia    clastrophobia   Hyperlipidemia    Hypertension    Hypothyroidism    Kidney stones    hx of    Lichen sclerosus    Psoriasis    Stroke ([place])    Urinary tract infection    hx of    Past Surgical History:  Procedure Laterality Date   APPENDECTOMY     CARDIAC CATHETERIZATION     2011   CARPAL TUNNEL RELEASE     Left trigger finger   CATARACT EXTRACTION W/PHACO Left 06/06/2018   Procedure: CATARACT EXTRACTION PHACO AND INTRAOCULAR LENS PLACEMENT ([place]);  Surgeon: Birder Robson, MD;  Location: [facility] ORS;  Service: Ophthalmology;  Laterality: Left;  [place]  00:23 AP% 16.0 CDE 3.69 Fluid pack lot # [id number]   CATARACT EXTRACTION W/PHACO Right 09/19/2018   Procedure: CATARACT EXTRACTION PHACO AND INTRAOCULAR LENS PLACEMENT ([facility]);  Surgeon: Birder Robson, MD;  Location: [facility];  Service: Ophthalmology;  Laterality: Right;  [place] 00:29.5 AP% 17.0 CDE$ 5.03 Fluid pack lot # [id number]   COLONOSCOPY WITH PROPOFOL N/A 05/13/2017   Procedure: COLONOSCOPY WITH PROPOFOL;  Surgeon: Manya Silvas, MD;  Location: [facility];  Service: Endoscopy;   Laterality: N/A;   ESOPHAGOGASTRODUODENOSCOPY (EGD) WITH PROPOFOL N/A 01/04/2020   Procedure: ESOPHAGOGASTRODUODENOSCOPY (EGD) WITH PROPOFOL;  Surgeon: [name], MD;  Location[facility];  Service: Gastroenterology;  Laterality: N/A;   EYE SURGERY  2004   lasik - right eye    JOINT REPLACEMENT     KNEE SURGERY Right 2015   arthroscopy   REVERSE SHOULDER ARTHROPLASTY Right 10/21/2022   Procedure: REVERSE SHOULDER ARTHROPLASTY;  Surgeon: Justice Britain, MD;  Location: [facility];  Service: Orthopedics;  Laterality: Right;  132mn   TOTAL KNEE ARTHROPLASTY Bilateral 06/12/2015   Procedure: TOTAL KNEE BILATERAL;  Surgeon: FGaynelle Arabian MD;  Location: [facility]S;  Service: Orthopedics;  Laterality: Bilateral;  and epidural   TUBAL LIGATION     Tubes tied     and untied. and tied again   Patient Active Problem List   Diagnosis Date Noted   Statin myopathy 06/04/2022   Hordeolum externum of left upper eyelid 02/17/2022   Acute gout involving toe of right foot 08/12/2021   Chronic pain of both shoulders 08/12/2021   History of TIA (transient ischemic attack) 06/26/2021   Statin intolerance 01/15/2021   MDD (major depressive disorder), recurrent episode, moderate ([place] 07/08/2020  Chronic insomnia 07/08/2020   Chronic neck pain 10/05/2019   Chronic diarrhea 03/30/2018   GAD (generalized anxiety disorder) 01/31/2018   Osteopenia 12/08/2017   Vitamin D deficiency 03/04/2016   Allergic rhinitis 03/04/2016   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)  ([place]) 10/20/2015   External hemorrhoid 10/20/2015   Psoriasis 10/20/2015   History of bilateral knee replacement 10/20/2015   Osteoarthritis of knee 11/25/2014   Hypertension associated with diabetes ([place]) 11/06/2010   CAD, NATIVE VESSEL 11/06/2010   Hyperlipidemia associated with type 2 diabetes mellitus ([place]) 01/25/2006   Adult hypothyroidism 11/15/2005    PCP: Jinny Sanders, MD  REFERRING PROVIDER: Jenetta Loges, PA-C  REFERRING DIAGNOSIS: [id number] (ICD-10-CM) - Presence of unspecified artificial shoulder joint   THERAPY DIAG: Acute pain of right shoulder  Stiffness of right shoulder, not elsewhere classified  Muscle weakness (generalized)  RATIONALE FOR EVALUATION AND TREATMENT: Rehabilitation  ONSET DATE: Right Reverse TSA, DOS: 10/21/22  FOLLOW UP APPT WITH PROVIDER: Yes , returns to MD 11/29/22   SUBJECTIVE:                                                                                                                                                                                         Chief Complaint: Pt is 76 year old female s/p right reverse TSA 10/21/22.   Pertinent History Pt is 76 year old female s/p right reverse TSA 10/21/22. Patient reports performing home exercises given by [facility] health including pendulums, elbow AROM, wrist AROM, and sliding hand on her lap. Patient reports more pain rTSA compared to bilat TKA. Pt has been released from using her sling; pt uses only for going out in public and walking outside. Pt denies significant post-operative complications.   Pain:  Pain Intensity: Present: 3-4/10, Best: 1-2/10, Worst: 8-9/10 Pain location: Lateral arm and down to R elbow Pain Quality: aching  Radiating: Yes , down to R elbow Numbness/Tingling: Yes, NT down to her R fingers, affecting all digits  Aggravating factors: trying to lie down, raising arm inadvertently Relieving factors: avoiding unnecessary R shoulder activity  History of prior shoulder or neck/shoulder injury, pain, surgery, or therapy: Yes, PT prior to rTSA  Falls: Has patient fallen in last 6 months? No, Number of falls: N/A Dominant hand: left Imaging: Yes , X-rays post-operatively demonstrated good healing, no hardware issues  Prior level of function: Independent Occupational demands: N/A Hobbies:  walking exercise regimen Red flags (personal history of cancer, chills/fever, night sweats,  nausea, vomiting, unrelenting pain): Negative  Precautions: rTSA post-op precautions  Weight Bearing Restrictions: No  Living Environment Lives with: lives alone, children live in [place] close  to her; 4 daughters, 2 sons Lives in: House/apartment   Patient Goals: Return to walking exercise regimen, able to travel/drive as needed    OBJECTIVE:   Patient Surveys  FOTO: 6, predicted outcome score of 55  Cognition Patient is oriented to person, place, and time.  Recent memory is intact.  Remote memory is intact.  Attention span and concentration are intact.  Expressive speech is intact.  Patient's fund of knowledge is within normal limits for educational level.    Gross Musculoskeletal Assessment Tremor: None Bulk: Normal Tone: Normal   Posture Forward head, rounded shoulders, inc thoracic kyphosis    AROM  AROM (Normal range in degrees) AROM 11/17/2022   Right Left  Shoulder    Flexion Deferred Deferred  Extension Deferred Deferred  Abduction Deferred Deferred  External Rotation Deferred Deferred  Internal Rotation Deferred Deferred  Hands Behind Head Deferred Deferred  Hands Behind Back Deferred Deferred      Elbow    Flexion WNL WNL  Extension -15 WNL  Pronation WNL WNL  Supination WNL WNL  (* = pain; Blank rows = not tested)   Shoulder PROM Flexion: R 90 deg Abduction: R 40 deg ER: R 30 deg   LE MMT: MMTs deferred due to post-operative status* MMT (out of 5) Right 11/17/2022 Left 11/17/2022      Shoulder   Flexion    Extension    Abduction    External rotation    Internal rotation    Horizontal abduction    Horizontal adduction    Lower Trapezius    Rhomboids        Elbow  Flexion    Extension    Pronation    Supination        Wrist  Flexion    Extension    Radial deviation    Ulnar deviation        MCP  Flexion    Extension    Abduction    Adduction    (* = pain; Blank rows = not tested)  Sensation Grossly  intact to light touch bilateral UE as determined by testing dermatomes C2-T2. Proprioception and hot/cold testing deferred on this date.   Palpation  Location LEFT  RIGHT           Subocciptials    Cervical paraspinals    Upper Trapezius  1  Levator Scapulae    Rhomboid Major/Minor    Sternoclavicular joint    Acromioclavicular joint    Coracoid process    Long head of biceps    Supraspinatus    Infraspinatus    Subscapularis    Teres Minor    Teres Major    Pectoralis Major  0  Pectoralis Minor  0  Anterior Deltoid  1  Lateral Deltoid  1  Posterior Deltoid  1  Latissimus Dorsi    Sternocleidomastoid    (Blank rows = not tested) Graded on 0-4 scale (0 = no pain, 1 = pain, 2 = pain with wincing/grimacing/flinching, 3 = pain with withdrawal, 4 = unwilling to allow palpation), (Blank rows = not tested)   Accessory Motions/Glides Deferred    TODAY'S TREATMENT    Therapeutic Exercise - for HEP establishment, discussion on appropriate exercise/activity modification, PT education   Reviewed baseline home exercises and provided handout for [facility] program (see Access Code); tactile cueing and therapist demonstration utilized as needed for carryover of proper technique to HEP.    Patient education on current condition, anatomy involved, prognosis, plan  of care. Discussion on activity modification to protect surgery including no lifting of R arm at this time, no significant weight in RUE, no active ER, and no putting hand behind back.     PATIENT EDUCATION:  Education details: Plan of care, HEP Person educated: Patient Education method: Explanation Education comprehension: verbalized understanding   HOME EXERCISE PROGRAM: Exercises - Supine Shoulder Flexion AAROM with dowel (up to 90 deg only); 2x10, 2x/day -Gripping, theraputty; 2-3 min; 2x/day -Pt continuing HEP given from referring clinic (elbow AROM, pendulums, wrist and digit  AROM)    ASSESSMENT:  CLINICAL IMPRESSION: Patient is a 76 y.o. female who was seen today for physical therapy evaluation and treatment s/p R reverse TSA 10/21/22. Objective impairments include decreased ROM, decreased strength, hypomobility, impaired UE functional use, postural dysfunction, and pain. These impairments are limiting patient from meal prep, cleaning, laundry, driving, shopping, and community activity. Personal factors including Age, Past/current experiences, and 3+ comorbidities: (anxiety, CAD, HLD, HTN, OA, Hx of TIA, hypothyroidism)  are also affecting patient's functional outcome. Patient will benefit from skilled PT to address above impairments and improve overall function.  REHAB POTENTIAL: Good  CLINICAL DECISION MAKING: Stable/uncomplicated  EVALUATION COMPLEXITY: Low   GOALS: Goals reviewed with patient? Yes  SHORT TERM GOALS: Target date: 12/15/2022  Pt will be independent with HEP to improve strength and decrease neck pain to improve pain-free function at home and work. Baseline: 11/16/22: Baseline HEP initiated, HEP previously given from referring office reviewed.   Goal status: INITIAL   LONG TERM GOALS: Target date: 01/12/2023  Pt will increase FOTO to at least 53 to demonstrate significant improvement in function at home and work related to neck pain  Baseline: 11/16/22: FOTO 29.   Goal status: INITIAL  2.  Pt will decrease worst shoulder pain by at least 3 points on the NPRS in order to demonstrate clinically significant reduction in shoulder pain. Baseline: 11/16/22: 8-9/10 at worst Goal status: INITIAL  3.   Patient will have shoulder flexion AROM to at least 130 deg as needed for functional reaching, self-care activities, household chores Baseline: 11/16/22: R shoulder flexion AROM not tested due to post-op status, flexion PROM to 90 deg (up to current restriction).    Goal status: INITIAL  4. Pt will increase strength of tested shoulder  musculature to at least 4/5 or greater MMT grade in order to demonstrate improvement in strength and function as needed for light to moderate lifting and household chores        Baseline: 11/16/22: MMT 2-/5 globally given decreased shoulder ROM.  Goal status: INITIAL   PLAN: PT FREQUENCY: 2x/week  PT DURATION: 8 weeks  PLANNED INTERVENTIONS: Therapeutic exercises, Therapeutic activity, Neuromuscular re-education, Balance training, Gait training, Patient/Family education, Joint manipulation, Joint mobilization, Dry Needling, Electrical stimulation, Cryotherapy, Moist heat, Traction, Ultrasound, Ionotophoresis '4mg'$ /ml Dexamethasone, and Manual therapy  PLAN FOR NEXT SESSION: Gentle R shoulder PROM within pt tolerance and up to limits of restrictions per protocol. Continue with AAROM as tolerated within post-operative guidelines. Initiate isometrics next visit.    Valentina Gu, PT, DPT #[id number]  Eilleen Kempf 11/17/2022, 9:28 AM

## 2022-11-22 ENCOUNTER — Encounter: Payer: Self-pay | Admitting: Physical Therapy

## 2022-11-22 ENCOUNTER — Ambulatory Visit: Payer: Medicare Other | Admitting: Physical Therapy

## 2022-11-22 DIAGNOSIS — M6281 Muscle weakness (generalized): Secondary | ICD-10-CM

## 2022-11-22 DIAGNOSIS — M25511 Pain in right shoulder: Secondary | ICD-10-CM | POA: Diagnosis not present

## 2022-11-22 DIAGNOSIS — M25611 Stiffness of right shoulder, not elsewhere classified: Secondary | ICD-10-CM

## 2022-11-22 NOTE — Therapy (Signed)
OUTPATIENT PHYSICAL THERAPY TREATMENT NOTE   Patient Name: Renee [name] MRN: [id number] DOB:Aug 04, 1946, 76 y.o., female Today's Date: 11/22/2022  PCP: Jinny Sanders, MD  REFERRING PROVIDER: Jenetta Loges, PA-C   END OF SESSION:   PT End of Session - 11/22/22 1725     Visit Number 2    Number of Visits 17    Date for PT Re-Evaluation 01/13/23    Authorization Type Medicare 2023    Progress Note Due on Visit 10    PT Start Time 1418    PT Stop Time 1501    PT Time Calculation (min) 43 min    Activity Tolerance No increased pain    Behavior During Therapy WFL for tasks assessed/performed             Past Medical History:  Diagnosis Date   Allergy    Anxiety    Arthritis    CAD (coronary artery disease)    non-obstructive. cath 11/11   Claustrophobia    Diabetes mellitus    borderline diabetic - diet controlled    GERD (gastroesophageal reflux disease)    History of chicken pox    History of phobia    clastrophobia   Hyperlipidemia    Hypertension    Hypothyroidism    Kidney stones    hx of    Lichen sclerosus    Psoriasis    Stroke ([place])    Urinary tract infection    hx of    Past Surgical History:  Procedure Laterality Date   APPENDECTOMY     CARDIAC CATHETERIZATION     2011   CARPAL TUNNEL RELEASE     Left trigger finger   CATARACT EXTRACTION W/PHACO Left 06/06/2018   Procedure: CATARACT EXTRACTION PHACO AND INTRAOCULAR LENS PLACEMENT ([place]);  Surgeon: Birder Robson, MD;  Location: [facility] ORS;  Service: Ophthalmology;  Laterality: Left;  [place]  00:23 AP% 16.0 CDE 3.69 Fluid pack lot # [id number]   CATARACT EXTRACTION W/PHACO Right 09/19/2018   Procedure: CATARACT EXTRACTION PHACO AND INTRAOCULAR LENS PLACEMENT ([facility]);  Surgeon: Birder Robson, MD;  Location: [facility];  Service: Ophthalmology;  Laterality: Right;  [place] 00:29.5 AP% 17.0 CDE$ 5.03 Fluid pack lot # [id number]   COLONOSCOPY WITH PROPOFOL N/A 05/13/2017   Procedure: COLONOSCOPY WITH  PROPOFOL;  Surgeon: Manya Silvas, MD;  Location: [facility]COPY;  Service: Endoscopy;  Laterality: N/A;   ESOPHAGOGASTRODUODENOSCOPY (EGD) WITH PROPOFOL N/A 01/04/2020   Procedure: ESOPHAGOGASTRODUODENOSCOPY (EGD) WITH PROPOFOL;  Surgeon: [name], MD;  Location: [facility] ENDOSCOPY;  Service: Gastroenterology;  Laterality: N/A;   EYE SURGERY  2004   lasik - right eye    JOINT REPLACEMENT     KNEE SURGERY Right 2015   arthroscopy   REVERSE SHOULDER ARTHROPLASTY Right 10/21/2022   Procedure: REVERSE SHOULDER ARTHROPLASTY;  Surgeon: Justice Britain, MD;  Location: [facility] ORS;  Service: Orthopedics;  Laterality: Right;  14mn   TOTAL KNEE ARTHROPLASTY Bilateral 06/12/2015   Procedure: TOTAL KNEE BILATERAL;  Surgeon: FGaynelle Arabian MD;  Location: [facility] ORS;  Service: Orthopedics;  Laterality: Bilateral;  and epidural   TUBAL LIGATION     Tubes tied     and untied. and tied again   Patient Active Problem List   Diagnosis Date Noted   Statin myopathy 06/04/2022   Hordeolum externum of left upper eyelid 02/17/2022   Acute gout involving toe of right foot 08/12/2021   Chronic pain of both shoulders 08/12/2021   History of TIA (transient ischemic attack)  06/26/2021   Statin intolerance 01/15/2021   MDD (major depressive disorder), recurrent episode, moderate (HCC) 07/08/2020   Chronic insomnia 07/08/2020   Chronic neck pain 10/05/2019   Chronic diarrhea 03/30/2018   GAD (generalized anxiety disorder) 01/31/2018   Osteopenia 12/08/2017   Vitamin D deficiency 03/04/2016   Allergic rhinitis 03/04/2016   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)  ([place]) 10/20/2015   External hemorrhoid 10/20/2015   Psoriasis 10/20/2015   History of bilateral knee replacement 10/20/2015   Osteoarthritis of knee 11/25/2014   Hypertension associated with diabetes ([place]) 11/06/2010   CAD, NATIVE VESSEL 11/06/2010   Hyperlipidemia associated with type 2 diabetes mellitus ([place]) 01/25/2006    Adult hypothyroidism 11/15/2005    REFERRING DIAG: Z96.619 (ICD-10-CM) - Presence of unspecified artificial shoulder joint    THERAPY DIAG:  Acute pain of right shoulder  Stiffness of right shoulder, not elsewhere classified  Muscle weakness (generalized)  Rationale for Evaluation and Treatment Rehabilitation  PERTINENT HISTORY: Pt is 76 year old female s/p right reverse TSA 10/21/22. Patient reports performing home exercises given by [facility] including pendulums, elbow AROM, wrist AROM, and sliding hand on her lap. Patient reports more pain rTSA compared to bilat TKA. Pt has been released from using her sling; pt uses only for going out in public and walking outside. Pt denies significant post-operative complications.    Pain:  Pain Intensity: Present: 3-4/10, Best: 1-2/10, Worst: 8-9/10 Pain location: Lateral arm and down to R elbow Pain Quality: aching  Radiating: Yes , down to R elbow Numbness/Tingling: Yes, NT down to her R fingers, affecting all digits  Aggravating factors: trying to lie down, raising arm inadvertently Relieving factors: avoiding unnecessary R shoulder activity  History of prior shoulder or neck/shoulder injury, pain, surgery, or therapy: Yes, PT prior to rTSA  Falls: Has patient fallen in last 6 months? No, Number of falls: N/A Dominant hand: left Imaging: Yes , X-rays post-operatively demonstrated good healing, no hardware issues   Prior level of function: Independent Occupational demands: N/A Hobbies:  walking exercise regimen Red flags (personal history of cancer, chills/fever, night sweats, nausea, vomiting, unrelenting pain): Negative   Precautions: rTSA post-op precautions   Weight Bearing Restrictions: No   Living Environment Lives with: lives alone, children live in [place] close to her; 4 daughters, 2 sons Lives in: House/apartment     Patient Goals: Return to walking exercise regimen, able to travel/drive as needed    PRECAUTIONS:  ER to 45, Abduction to 90, Flexion to 115 deg (weeks 4-8)  Right Reverse TSA, DOS: 10/21/22   SUBJECTIVE:  SUBJECTIVE STATEMENT:  Patient reports minimal pain during the day and she denies significant pain at arrival. She reports ongoing notable pain at night. She reports positioning pillows around her upper arm for comfort. Pt is compliant with her HEP and reports being able to reach with her R arm better.    PAIN:  Are you having pain? No   OBJECTIVE: (objective measures completed at initial evaluation unless otherwise dated)   Patient Surveys  FOTO: 54, predicted outcome score of 54   Cognition Patient is oriented to person, place, and time.  Recent memory is intact.  Remote memory is intact.  Attention span and concentration are intact.  Expressive speech is intact.  Patient's fund of knowledge is within normal limits for educational level.                          Gross Musculoskeletal Assessment Tremor: None Bulk: Normal Tone: Normal     Posture Forward head, rounded shoulders, inc thoracic kyphosis       AROM       AROM (Normal range in degrees) AROM 11/17/2022    Right Left  Shoulder      Flexion Deferred Deferred  Extension Deferred Deferred  Abduction Deferred Deferred  External Rotation Deferred Deferred  Internal Rotation Deferred Deferred  Hands Behind Head Deferred Deferred  Hands Behind Back Deferred Deferred         Elbow      Flexion WNL WNL  Extension -15 WNL  Pronation WNL WNL  Supination WNL WNL  (* = pain; Blank rows = not tested)     Shoulder PROM Flexion: R 90 deg Abduction: R 40 deg ER: R 30 deg     LE MMT: MMTs deferred due to post-operative status* MMT (out of 5) Right 11/17/2022 Left 11/17/2022         Shoulder   Flexion      Extension       Abduction      External rotation      Internal rotation      Horizontal abduction      Horizontal adduction      Lower Trapezius      Rhomboids             Elbow  Flexion      Extension      Pronation      Supination             Wrist  Flexion      Extension      Radial deviation      Ulnar deviation             MCP  Flexion      Extension      Abduction      Adduction      (* = pain; Blank rows = not tested)   Sensation Grossly intact to light touch bilateral UE as determined by testing dermatomes C2-T2. Proprioception and hot/cold testing deferred on this date.     Palpation   Location LEFT  RIGHT           Subocciptials      Cervical paraspinals      Upper Trapezius   1  Levator Scapulae      Rhomboid Major/Minor      Sternoclavicular joint      Acromioclavicular joint      Coracoid process      Long head of biceps  Supraspinatus      Infraspinatus      Subscapularis      Teres Minor      Teres Major      Pectoralis Major   0  Pectoralis Minor   0  Anterior Deltoid   1  Lateral Deltoid   1  Posterior Deltoid   1  Latissimus Dorsi      Sternocleidomastoid      (Blank rows = not tested) Graded on 0-4 scale (0 = no pain, 1 = pain, 2 = pain with wincing/grimacing/flinching, 3 = pain with withdrawal, 4 = unwilling to allow palpation), (Blank rows = not tested)     Accessory Motions/Glides Deferred       TODAY'S TREATMENT      Manual Therapy - for symptom modulation, soft tissue sensitivity and mobility, shoulder ROM  STM R middle and anterior deltoid, biceps; x 3 minutes R shoulder PROM up to limits of protocol; x 8 minutes    Therapeutic Exercise - for shoulder complex ROM as needed for functional reaching and household chores;    Supine; Wand flexion AAROM; 1x10 - verbal cueing for safe ROM, forward elevation just past 90 deg  Supine; Wand ER AAROM; to 45 deg only; 2x10  Pulley, forward elevation; x 2 minutes Shoulder ER with elbow  on tabletop, in sitting; x20 Shoulder isometrics, abduction and ER; x15, 5 sec hld  -heavy verbal cueing and demonstration for technique and limited force with submaximal isometrics   PATIENT EDUCATION: Reviewed current post-op restrictions, encouraged continued HEP, provided pulleys for AAROM at home       PATIENT EDUCATION:  Education details: Plan of care, HEP Person educated: Patient Education method: Explanation Education comprehension: verbalized understanding     HOME EXERCISE PROGRAM: Exercises - Supine Shoulder Flexion AAROM with dowel (up to 90 deg only); 2x10, 2x/day -Gripping, theraputty; 2-3 min; 2x/day -Pt continuing HEP given from referring clinic (elbow AROM, pendulums, wrist and digit AROM)       ASSESSMENT:   CLINICAL IMPRESSION: Patient arrives with excellent motivation to participate in physical therapy. Pt is able to tolerate active-assisted and passive ROM up to limits permitted on protocol after 4 weeks. She is having difficulty with pain and comfort with lying down at night. Discussed strategies for positioning to limit extension of R shoulder and maintain arm in protected position; we also discussed use of cryotherapy/ice pack prn. Patient has remaining deficits in R shoulder AROM, R deltoid/RTC/periscapular mm strength, dec functional RUE use, and pain. Patient will benefit from continued skilled therapeutic intervention to address the above deficits as needed for improved function and QoL.    REHAB POTENTIAL: Good   CLINICAL DECISION MAKING: Stable/uncomplicated   EVALUATION COMPLEXITY: Low     GOALS: Goals reviewed with patient? Yes   SHORT TERM GOALS: Target date: 12/15/2022   Pt will be independent with HEP to improve strength and decrease neck pain to improve pain-free function at home and work. Baseline: 11/16/22: Baseline HEP initiated, HEP previously given from referring office reviewed.   Goal status: INITIAL     LONG TERM GOALS: Target  date: 01/12/2023   Pt will increase FOTO to at least 53 to demonstrate significant improvement in function at home and work related to neck pain  Baseline: 11/16/22: FOTO 29.   Goal status: INITIAL   2.  Pt will decrease worst shoulder pain by at least 3 points on the NPRS in order to demonstrate clinically significant reduction in shoulder pain.  Baseline: 11/16/22: 8-9/10 at worst Goal status: INITIAL   3.   Patient will have shoulder flexion AROM to at least 130 deg as needed for functional reaching, self-care activities, household chores Baseline: 11/16/22: R shoulder flexion AROM not tested due to post-op status, flexion PROM to 90 deg (up to current restriction).    Goal status: INITIAL   4. Pt will increase strength of tested shoulder musculature to at least 4/5 or greater MMT grade in order to demonstrate improvement in strength and function as needed for light to moderate lifting and household chores          Baseline: 11/16/22: MMT 2-/5 globally given decreased shoulder ROM.  Goal status: INITIAL     PLAN: PT FREQUENCY: 2x/week   PT DURATION: 8 weeks   PLANNED INTERVENTIONS: Therapeutic exercises, Therapeutic activity, Neuromuscular re-education, Balance training, Gait training, Patient/Family education, Joint manipulation, Joint mobilization, Dry Needling, Electrical stimulation, Cryotherapy, Moist heat, Traction, Ultrasound, Ionotophoresis '4mg'$ /ml Dexamethasone, and Manual therapy   PLAN FOR NEXT SESSION: Gentle R shoulder PROM within pt tolerance and up to limits of restrictions per protocol. Continue with AAROM as tolerated within post-operative guidelines. Continue with isometrics and gentle periscapular isotonics without R shoulder hyperextension.      Valentina Gu, PT, DPT #[id number]  Eilleen Kempf, PT 11/22/2022, 5:26 PM

## 2022-11-24 ENCOUNTER — Ambulatory Visit: Payer: Medicare Other

## 2022-11-24 DIAGNOSIS — M6281 Muscle weakness (generalized): Secondary | ICD-10-CM | POA: Diagnosis not present

## 2022-11-24 DIAGNOSIS — M25611 Stiffness of right shoulder, not elsewhere classified: Secondary | ICD-10-CM

## 2022-11-24 DIAGNOSIS — M25511 Pain in right shoulder: Secondary | ICD-10-CM | POA: Diagnosis not present

## 2022-11-24 NOTE — Therapy (Signed)
OUTPATIENT PHYSICAL THERAPY TREATMENT NOTE   Patient Name: Renee [name] MRN: [id number] DOB:[birth year], 76 y.o., female Today's Date: 11/24/2022  PCP: Jinny Sanders, MD  REFERRING PROVIDER: Jenetta Loges, PA-C   END OF SESSION:   PT End of Session - 11/24/22 1424     Visit Number 3    Number of Visits 17    Date for PT Re-Evaluation 01/13/23    Authorization Type Medicare 2023    Progress Note Due on Visit 10    PT Start Time 1418    PT Stop Time 1500    PT Time Calculation (min) 42 min    Activity Tolerance No increased pain    Behavior During Therapy WFL for tasks assessed/performed             Past Medical History:  Diagnosis Date   Allergy    Anxiety    Arthritis    CAD (coronary artery disease)    non-obstructive. cath 11/11   Claustrophobia    Diabetes mellitus    borderline diabetic - diet controlled    GERD (gastroesophageal reflux disease)    History of chicken pox    History of phobia    clastrophobia   Hyperlipidemia    Hypertension    Hypothyroidism    Kidney stones    hx of    Lichen sclerosus    Psoriasis    Stroke ([place])    Urinary tract infection    hx of    Past Surgical History:  Procedure Laterality Date   APPENDECTOMY     CARDIAC CATHETERIZATION     2011   CARPAL TUNNEL RELEASE     Left trigger finger   CATARACT EXTRACTION W/PHACO Left 06/06/2018   Procedure: CATARACT EXTRACTION PHACO AND INTRAOCULAR LENS PLACEMENT ([place]);  Surgeon: Birder Robson, MD;  Location: [facility] ORS;  Service: Ophthalmology;  Laterality: Left;  [place]  00:23 AP% 16.0 CDE 3.69 Fluid pack lot # [id number]   CATARACT EXTRACTION W/PHACO Right 09/19/2018   Procedure: CATARACT EXTRACTION PHACO AND INTRAOCULAR LENS PLACEMENT ([facility]);  Surgeon: Birder Robson, MD;  Location: [facility];  Service: Ophthalmology;  Laterality: Right;  [place] 00:29.5 AP% 17.0 CDE$ 5.03 Fluid pack lot # [id number]   COLONOSCOPY WITH PROPOFOL N/A 05/13/2017   Procedure: COLONOSCOPY WITH  PROPOFOL;  Surgeon: Manya Silvas, MD;  Location: [facility] ENDOSCOPY;  Service: Endoscopy;  Laterality: N/A;   ESOPHAGOGASTRODUODENOSCOPY (EGD) WITH PROPOFOL N/A 01/04/2020   Procedure: ESOPHAGOGASTRODUODENOSCOPY (EGD) WITH PROPOFOL;  Surgeon: [name], MD;  Location: [facility];  Service: Gastroenterology;  Laterality: N/A;   EYE SURGERY  2004   lasik - right eye    JOINT REPLACEMENT     KNEE SURGERY Right 2015   arthroscopy   REVERSE SHOULDER ARTHROPLASTY Right 10/21/2022   Procedure: REVERSE SHOULDER ARTHROPLASTY;  Surgeon: Justice Britain, MD;  Location: [facility] ORS;  Service: Orthopedics;  Laterality: Right;  110mn   TOTAL KNEE ARTHROPLASTY Bilateral 06/12/2015   Procedure: TOTAL KNEE BILATERAL;  Surgeon: FGaynelle Arabian MD;  Location: [facility] ORS;  Service: Orthopedics;  Laterality: Bilateral;  and epidural   TUBAL LIGATION     Tubes tied     and untied. and tied again   Patient Active Problem List   Diagnosis Date Noted   Statin myopathy 06/04/2022   Hordeolum externum of left upper eyelid 02/17/2022   Acute gout involving toe of right foot 08/12/2021   Chronic pain of both shoulders 08/12/2021   History of TIA (transient ischemic attack)  06/26/2021   Statin intolerance 01/15/2021   MDD (major depressive disorder), recurrent episode, moderate (HCC) 07/08/2020   Chronic insomnia 07/08/2020   Chronic neck pain 10/05/2019   Chronic diarrhea 03/30/2018   GAD (generalized anxiety disorder) 01/31/2018   Osteopenia 12/08/2017   Vitamin D deficiency 03/04/2016   Allergic rhinitis 03/04/2016   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)  ([place]) 10/20/2015   External hemorrhoid 10/20/2015   Psoriasis 10/20/2015   History of bilateral knee replacement 10/20/2015   Osteoarthritis of knee 11/25/2014   Hypertension associated with diabetes ([place]) 11/06/2010   CAD, NATIVE VESSEL 11/06/2010   Hyperlipidemia associated with type 2 diabetes mellitus ([place]) 01/25/2006    Adult hypothyroidism 11/15/2005    REFERRING DIAG: Z96.619 (ICD-10-CM) - Presence of unspecified artificial shoulder joint    THERAPY DIAG:  Acute pain of right shoulder  Stiffness of right shoulder, not elsewhere classified  Muscle weakness (generalized)  Rationale for Evaluation and Treatment Rehabilitation  PERTINENT HISTORY: Pt is 76 year old female s/p right reverse TSA 10/21/22. Patient reports performing home exercises given by [facility] health including pendulums, elbow AROM, wrist AROM, and sliding hand on her lap. Patient reports more pain rTSA compared to bilat TKA. Pt has been released from using her sling; pt uses only for going out in public and walking outside. Pt denies significant post-operative complications.    Pain:  Pain Intensity: Present: 3-4/10, Best: 1-2/10, Worst: 8-9/10 Pain location: Lateral arm and down to R elbow Pain Quality: aching  Radiating: Yes , down to R elbow Numbness/Tingling: Yes, NT down to her R fingers, affecting all digits  Aggravating factors: trying to lie down, raising arm inadvertently Relieving factors: avoiding unnecessary R shoulder activity  History of prior shoulder or neck/shoulder injury, pain, surgery, or therapy: Yes, PT prior to rTSA  Falls: Has patient fallen in last 6 months? No, Number of falls: N/A Dominant hand: left Imaging: Yes , X-rays post-operatively demonstrated good healing, no hardware issues   Prior level of function: Independent Occupational demands: N/A Hobbies:  walking exercise regimen Red flags (personal history of cancer, chills/fever, night sweats, nausea, vomiting, unrelenting pain): Negative   Precautions: rTSA post-op precautions   Weight Bearing Restrictions: No   Living Environment Lives with: lives alone, children live in [place] close to her; 4 daughters, 2 sons Lives in: House/apartment     Patient Goals: Return to walking exercise regimen, able to travel/drive as needed    PRECAUTIONS:  ER to 45, Abduction to 90, Flexion to 115 deg (weeks 4-8)  Right Reverse TSA, DOS: 10/21/22   SUBJECTIVE:  SUBJECTIVE STATEMENT:  Patient reports minimal pain during the day and she denies significant pain at arrival. She is noticing an improvement in being able to do her hair now.  She is looking forward to when she is able to drive herself again.  Pt is compliant with her HEP and reports being able to reach with her R arm better.    PAIN:  Are you having pain? No   OBJECTIVE: (objective measures completed at initial evaluation unless otherwise dated)   Patient Surveys  FOTO: 65, predicted outcome score of 44   Cognition Patient is oriented to person, place, and time.  Recent memory is intact.  Remote memory is intact.  Attention span and concentration are intact.  Expressive speech is intact.  Patient's fund of knowledge is within normal limits for educational level.                          Gross Musculoskeletal Assessment Tremor: None Bulk: Normal Tone: Normal     Posture Forward head, rounded shoulders, inc thoracic kyphosis       AROM       AROM (Normal range in degrees) AROM 11/17/2022    Right Left  Shoulder      Flexion Deferred Deferred  Extension Deferred Deferred  Abduction Deferred Deferred  External Rotation Deferred Deferred  Internal Rotation Deferred Deferred  Hands Behind Head Deferred Deferred  Hands Behind Back Deferred Deferred         Elbow      Flexion WNL WNL  Extension -15 WNL  Pronation WNL WNL  Supination WNL WNL  (* = pain; Blank rows = not tested)     Shoulder PROM Flexion: R 90 deg Abduction: R 40 deg ER: R 30 deg     LE MMT: MMTs deferred due to post-operative status* MMT (out of 5) Right 11/17/2022 Left 11/17/2022         Shoulder    Flexion      Extension      Abduction      External rotation      Internal rotation      Horizontal abduction      Horizontal adduction      Lower Trapezius      Rhomboids             Elbow  Flexion      Extension      Pronation      Supination             Wrist  Flexion      Extension      Radial deviation      Ulnar deviation             MCP  Flexion      Extension      Abduction      Adduction      (* = pain; Blank rows = not tested)   Sensation Grossly intact to light touch bilateral UE as determined by testing dermatomes C2-T2. Proprioception and hot/cold testing deferred on this date.     Palpation   Location LEFT  RIGHT           Subocciptials      Cervical paraspinals      Upper Trapezius   1  Levator Scapulae      Rhomboid Major/Minor      Sternoclavicular joint      Acromioclavicular joint  Coracoid process      Long head of biceps      Supraspinatus      Infraspinatus      Subscapularis      Teres Minor      Teres Major      Pectoralis Major   0  Pectoralis Minor   0  Anterior Deltoid   1  Lateral Deltoid   1  Posterior Deltoid   1  Latissimus Dorsi      Sternocleidomastoid      (Blank rows = not tested) Graded on 0-4 scale (0 = no pain, 1 = pain, 2 = pain with wincing/grimacing/flinching, 3 = pain with withdrawal, 4 = unwilling to allow palpation), (Blank rows = not tested)     Accessory Motions/Glides Deferred       TODAY'S TREATMENT      Manual Therapy - for symptom modulation, soft tissue sensitivity and mobility, shoulder ROM  STM R middle and anterior deltoid, biceps; x 2 minutes R shoulder PROM flexion, ER, IR hand to abdomen, and abd up to limits of protocol; x 10 minutes Incision site observation/assessment- healing well    Therapeutic Exercise - for shoulder complex ROM as needed for functional reaching and household chores;    Supine; Wand flexion AAROM; 1x10 - verbal cueing for safe ROM, forward elevation to  110  Supine; Wand ER AAROM; to 45 deg only; 2x10  Pulley, forward elevation; x 2 minutes Shoulder ER with elbow on tabletop, in sitting; x20 Shoulder isometrics, flexion, abduction and ER; x15, 5 sec hld  -heavy verbal cueing and demonstration for technique and limited force with submaximal isometrics   PATIENT EDUCATION: Reviewed current post-op restrictions, encouraged continued HEP, provided pulleys for AAROM at home       PATIENT EDUCATION:  Education details: Plan of care, HEP Person educated: Patient Education method: Explanation Education comprehension: verbalized understanding     HOME EXERCISE PROGRAM: Exercises - Supine Shoulder Flexion AAROM with dowel (up to 90 deg only); 2x10, 2x/day -Gripping, theraputty; 2-3 min; 2x/day -Pt continuing HEP given from referring clinic (elbow AROM, pendulums, wrist and digit AROM)       ASSESSMENT:   CLINICAL IMPRESSION: Patient arrives with excellent motivation to participate in physical therapy.  She was able to tolerate progression of PROM within post op protocol precautions.  She does require heavy cueing during isometrics for proper technique. Patient will benefit from continued skilled therapeutic intervention to address the above deficits as needed for improved function and QoL.    REHAB POTENTIAL: Good   CLINICAL DECISION MAKING: Stable/uncomplicated   EVALUATION COMPLEXITY: Low     GOALS: Goals reviewed with patient? Yes   SHORT TERM GOALS: Target date: 12/15/2022   Pt will be independent with HEP to improve strength and decrease neck pain to improve pain-free function at home and work. Baseline: 11/16/22: Baseline HEP initiated, HEP previously given from referring office reviewed.   Goal status: INITIAL     LONG TERM GOALS: Target date: 01/12/2023   Pt will increase FOTO to at least 53 to demonstrate significant improvement in function at home and work related to neck pain  Baseline: 11/16/22: FOTO 29.   Goal  status: INITIAL   2.  Pt will decrease worst shoulder pain by at least 3 points on the NPRS in order to demonstrate clinically significant reduction in shoulder pain. Baseline: 11/16/22: 8-9/10 at worst Goal status: INITIAL   3.   Patient will have shoulder flexion AROM to  at least 130 deg as needed for functional reaching, self-care activities, household chores Baseline: 11/16/22: R shoulder flexion AROM not tested due to post-op status, flexion PROM to 90 deg (up to current restriction).    Goal status: INITIAL   4. Pt will increase strength of tested shoulder musculature to at least 4/5 or greater MMT grade in order to demonstrate improvement in strength and function as needed for light to moderate lifting and household chores          Baseline: 11/16/22: MMT 2-/5 globally given decreased shoulder ROM.  Goal status: INITIAL     PLAN: PT FREQUENCY: 2x/week   PT DURATION: 8 weeks   PLANNED INTERVENTIONS: Therapeutic exercises, Therapeutic activity, Neuromuscular re-education, Balance training, Gait training, Patient/Family education, Joint manipulation, Joint mobilization, Dry Needling, Electrical stimulation, Cryotherapy, Moist heat, Traction, Ultrasound, Ionotophoresis '4mg'$ /ml Dexamethasone, and Manual therapy   PLAN FOR NEXT SESSION: Gentle R shoulder PROM within pt tolerance and up to limits of restrictions per protocol. Continue with AAROM as tolerated within post-operative guidelines. Continue with isometrics and gentle periscapular isotonics without R shoulder hyperextension.      Merdis Delay, PT, DPT, OCS  #[id number]   Pincus Badder, PT 11/24/2022, 5:57 PM    [facility] [address] [place], [place], [address] Phone: [phone]   Fax:  [phone]  Patient Details  Name: NAKYA WEYAND MRN: [id number] Date of Birth: [birth year] Referring Provider:  Jenetta Loges, PA-C  Encounter Date: 11/24/2022

## 2022-11-25 ENCOUNTER — Ambulatory Visit (INDEPENDENT_AMBULATORY_CARE_PROVIDER_SITE_OTHER): Payer: Medicare Other | Admitting: Podiatry

## 2022-11-25 DIAGNOSIS — M79675 Pain in left toe(s): Secondary | ICD-10-CM

## 2022-11-25 DIAGNOSIS — Q828 Other specified congenital malformations of skin: Secondary | ICD-10-CM | POA: Diagnosis not present

## 2022-11-25 DIAGNOSIS — B351 Tinea unguium: Secondary | ICD-10-CM

## 2022-11-25 DIAGNOSIS — M79674 Pain in right toe(s): Secondary | ICD-10-CM

## 2022-11-25 NOTE — Progress Notes (Signed)
[facility] [address] [name]. [place] [address] [phone]       OFFICE FOLLOW UP VISIT NOTE  [name] Date of Birth:  1946/08/03 Medical Record Number:  [id number]   Referring MD:  Renee [name]  Reason for Referral:  TIA  Chief Complaint  Patient presents with   Follow-up    RM 3 with daughter Renee [name] Pt is well and stable, no new concerns       HPI:   Initial visit 06/25/2021 Dr. Phillips Climes. Renee [name] is a pleasant 76 year old Caucasian lady who is referred today for initial office consultation visit for TIA.  History is obtained from the patient, her daughter as well as review of electronic medical records and I personally reviewed pertinent imaging films in PACS.  She has a past medical history of coronary artery disease, diabetes, hypertension, hyperlipidemia, hypothyroidism and arthritis.  She states she had episode of sudden onset of blurred vision and dark vision when she woke up from sleep on 06/17/2021.  She was able to see but vision was not clear.  She checked her blood sugar which was fine.  She also noticed that she has some trouble thinking straight and trouble speaking.  She went to a restaurant where she had trouble ordering her food eventually she was able to place an order.  She subsequently had a meal and sat in a car and while driving to the gynecologist office noticed numbness in the right hand from the wrist down.  She also felt that her right cheek of her cheek was being pulled to the side.  When she continues with annual gynecology she advised her to go to the ER.  Her symptoms are completely resolved by the time she reached there.  Patient denied any accompanying headache, double vision, vertigo, gait or balance problems.  Patient said she had a somewhat similar episode a couple of years ago when she also had some difficulty with vision, focusing as well as trouble thinking and word finding which also did not stay long.  She did not have a  headache after that.  She did not seek medical help at that time.  Patient does have a history of remote migraines but she states she is outgrown them and has not had one for years.  2 of her daughters also have migraines.  She denies any other prior history of strokes or TIAs or seizures.  She had a CT scan of the head on 06/17/2021 which showed possibly a right basal ganglia lacunar however an MRI scan done the next day showed no evidence of acute stroke or old strokes and only showed age-related atrophy and white matter changes.  Lab work on 04/27/2021 showed hemoglobin A1c of 7.3 and LDL cholesterol 140 mg percent.  Patient also has complaints of headache with posterior neck pain with tenderness in the left occipital groove.  She also complains of muscle aches and pains and in fact has an appointment soon to see rheumatologist Dr. Estanislado [name] for these complaints.Marland Kitchen Update 09/16/2021 Dr. Leonie [name]: She returns for follow-up after last visit 2 and half months ago.  She is accompanied by her daughter Renee [name].  Patient continues to have memory difficulties mostly for short-term.  She still lives independently and manages most of her activities of daily living though her daughters live nearby and provide support.  She has had no further episodes of TIA or blurred vision no complicated migraine episodes.  Main complaint today is chronic right shoulder pain  which limits her range of motion.  She has gotten some injection of steroids for chronic pain that has not helped.  She is currently doing some home therapy.  She had recent lab work on 08/24/2021 which showed borderline hemoglobin A1c of 6.6 and LDL cholesterol of 84 mg percent.  She remains on Zetia and previously has been intolerant to Crestor and Zocor.  She had CT angiogram of the brain and neck done on 07/17/2021 which showed no significant large vessel intracranial or extracranial stenosis.  There was 1 to 2 mm paraclinoid left ICA infundibulum versus tiny aneurysm noted.   Temporal artery ultrasound on 07/16/2021 was negative for halo sign. Update 03/16/2022 Dr. Leonie [name]; she returns for follow-up after last visit 6 months ago.  She is accompanied by her daughter.  Is doing well.  She continues to live alone and is independent in actives of daily living.  She is still driving.  She does not finances except online billing which her daughter handles.  She continues to have short-term memory difficulties but these are nonprogressive and unchanged.  She does write things down and uses a calendar.  She does not do many activities like solving puzzles or sudoku as she does not like it.  She has not had any stroke or TIA symptoms.  She remains on aspirin she is tolerating well without bruising or bleeding.  Her blood pressure is under good control today it is 138/70.  She states her sugars are doing pretty good.  She saw her primary care physician and all lab work is satisfactory.  On Mini-Mental status testing by my assistant today she scored 21/30 but probably would not have a good time is on my testing she scored recall 2/3 and able to name 14 animals will can walk on 4 legs and clock drawing she scored normal 4/4   Update 11/29/2022 JM: Reports overall stable since prior visit accompanied by her daughters (one in office, one via video).  Denies any new or reoccurring stroke/TIA symptoms since prior visit  Reports cognition has been about the same since prior visit. Lives alone, able to maintain ADLs and IADLs independently. Continues to drive, no issues with this. Appears to do well with compensation strategies. Remains on aspirin and Zetia.  Blood pressure well controlled, elevated today but has had a busy day with appointments, occasionally monitors at home and typically stable.        ROS:   14 system review of systems is positive for those listed in HPI and neck pain and all other systems negative  PMH:  Past Medical History:  Diagnosis Date   Allergy    Anxiety     Arthritis    CAD (coronary artery disease)    non-obstructive. cath 11/11   Claustrophobia    Diabetes mellitus    borderline diabetic - diet controlled    GERD (gastroesophageal reflux disease)    History of chicken pox    History of phobia    clastrophobia   Hyperlipidemia    Hypertension    Hypothyroidism    Kidney stones    hx of    Lichen sclerosus    Psoriasis    Stroke ([place])    Urinary tract infection    hx of     Social History:  Social History   Socioeconomic History   Marital status: Divorced    Spouse name: Not on file   Number of children: 6   Years of education: Not on file  Highest education level: Not on file  Occupational History   Occupation: Retired  Tobacco Use   Smoking status: Never   Smokeless tobacco: Never  Vaping Use   Vaping Use: Never used  Substance and Sexual Activity   Alcohol use: Yes    Comment: rare   Drug use: No   Sexual activity: Not Currently    Birth control/protection: Post-menopausal  Other Topics Concern   Not on file  Social History Narrative   Lives alone   Right Handed   Drink 1 cups caffeine daily   Social Determinants of Health   Financial Resource Strain: Low Risk  (12/08/2021)   Overall Financial Resource Strain (CARDIA)    Difficulty of Paying Living Expenses: Not very hard  Food Insecurity: No Food Insecurity (11/30/2021)   Hunger Vital Sign    Worried About Running Out of Food in the Last Year: Never true    Ran Out of Food in the Last Year: Never true  Transportation Needs: No Transportation Needs (11/30/2021)   PRAPARE - Hydrologist (Medical): No    Lack of Transportation (Non-Medical): No  Physical Activity: Sufficiently Active (11/30/2021)   Exercise Vital Sign    Days of Exercise per Week: 3 days    Minutes of Exercise per Session: 60 min  Stress: No Stress Concern Present (11/30/2021)   [place]     Feeling of Stress : Not at all  Social Connections: Socially Isolated (11/30/2021)   Social Connection and Isolation Panel [NHANES]    Frequency of Communication with Friends and Family: More than three times a week    Frequency of Social Gatherings with Friends and Family: Twice a week    Attends Religious Services: Never    Marine scientist or Organizations: No    Attends Archivist Meetings: Never    Marital Status: Divorced  Human resources officer Violence: Not At Risk (11/30/2021)   Humiliation, Afraid, Rape, and Kick questionnaire    Fear of Current or Ex-Partner: No    Emotionally Abused: No    Physically Abused: No    Sexually Abused: No    Medications:   Current Outpatient Medications on File Prior to Visit  Medication Sig Dispense Refill   acetaminophen (TYLENOL) 650 MG CR tablet Take 1,300 mg by mouth every 8 (eight) hours as needed for pain.     aspirin EC 81 MG tablet Take 81 mg by mouth daily.     Blood Glucose Monitoring Suppl (ONETOUCH VERIO) w/Device KIT by Does not apply route. Use to check fasting blood sugar daily and an occasional 2 hr postprandial     Carboxymethylcellulose Sodium (THERATEARS OP) Place 1 drop into both eyes at bedtime as needed (dry eyes).     clobetasol ointment (TEMOVATE) 0.05 % APPLY TOPICALLY DAILY AS NEEDED FOR PSORIASIS FLARES 60 g 0   Coenzyme Q10 (COQ10) 200 MG CAPS Take 200 mg by mouth daily.     Dulaglutide (TRULICITY) 6.80 SU/1.1SR SOPN Inject 0.75 mg into the skin once a week. Via [employer] Cares PAP     Emollient (CERAVE) CREA Apply 1 application  topically 2 (two) times daily as needed (psoriasis).     ezetimibe (ZETIA) 10 MG tablet Take 1 tablet (10 mg total) by mouth daily. 90 tablet 2   famotidine (PEPCID) 20 MG tablet Take 20 mg by mouth daily as needed for heartburn or indigestion.     Flaxseed,  Linseed, (FLAX SEED OIL PO) Take 1 capsule by mouth daily.     fluticasone (FLONASE) 50 MCG/ACT nasal spray Place 1 spray into both  nostrils daily as needed for allergies.     HYDROcodone-acetaminophen (NORCO/VICODIN) 5-325 MG tablet Take 1 tablet by mouth every 4 (four) hours as needed for moderate pain. 20 tablet 0   Lancets (ONETOUCH ULTRASOFT) lancets Use to check fasting blood sugar daily and an occasional 2 hr postprandial 100 each 11   levothyroxine (SYNTHROID) 75 MCG tablet TAKE 1 TABLET BY MOUTH EVERY DAY 90 tablet 3   losartan (COZAAR) 50 MG tablet TAKE 1 TABLET BY MOUTH EVERY DAY 90 tablet 1   MAGNESIUM PO Take 1 tablet by mouth daily.     Multiple Vitamin (MULTIVITAMIN) tablet Take 1 tablet by mouth daily.     nitroGLYCERIN (NITROSTAT) 0.4 MG SL tablet Place 1 tablet (0.4 mg total) under the tongue every 5 (five) minutes as needed. 25 tablet 1   ondansetron (ZOFRAN) 4 MG tablet Take 1 tablet (4 mg total) by mouth every 8 (eight) hours as needed for nausea or vomiting. 10 tablet 0   ONETOUCH VERIO test strip USE TO CHECK FASTING BLOOD SUGAR DAILY AND AN OCCASIONAL 2 HR POSTPRANDIAL. 100 strip 11   Skin Protectants, Misc. (EUCERIN) cream Apply 1 application  topically as needed (eczema).     tiZANidine (ZANAFLEX) 4 MG tablet Take 1 tablet (4 mg total) by mouth every 8 (eight) hours as needed for muscle spasms. (Patient not taking: Reported on 10/28/2022) 30 tablet 1   triamcinolone cream (KENALOG) 0.1 % Apply 1 application topically 2 (two) times daily as needed (psoriasis). (Patient not taking: Reported on 10/28/2022) 454 g 0   Vitamin D, Cholecalciferol, 25 MCG (1000 UT) CAPS Take 2,000 Units by mouth daily.     No current facility-administered medications on file prior to visit.    Allergies:   Allergies  Allergen Reactions   Celexa [Citalopram] Shortness Of Breath and Swelling    Throat swelling and difficulty breathing   Other Rash    Metal    Codeine Nausea And Vomiting   Metformin Nausea And Vomiting   Robaxin [Methocarbamol] Nausea Only   Crestor [Rosuvastatin] Rash   Nickel Rash   Simvastatin  Swelling and Rash       Physical Exam Today's Vitals   11/29/22 1518  BP: (!) 156/84  Pulse: 81  Weight: 138 lb (62.6 kg)  Height: _0  (1.575 m)   Body mass index is 25.24 kg/m.  General: well developed, well nourished very pleasant elderly Caucasian lady, seated, in no evident distress Head: head normocephalic and atraumatic.   Neck: supple with no carotid or supraclavicular bruits Cardiovascular: regular rate and rhythm, no murmurs Musculoskeletal: no deformity  Skin:  no rash/petichiae Vascular:  Normal pulses all extremities   Neurologic Exam Mental Status: Awake and fully alert. Oriented to place and time. Recent memory impaired and remote memory intact. Attention span, concentration and fund of knowledge appropriate. Mood and affect appropriate.  Cranial Nerves: Fundoscopic exam reveals sharp disc margins. Pupils equal, briskly reactive to light. Extraocular movements full without nystagmus. Visual fields full to confrontation. Hearing intact. Facial sensation intact. Face, tongue, palate moves normally and symmetrically.  Motor: Normal bulk and tone. Normal strength in all tested extremity muscles. Sensory.: intact to touch , pinprick , position and vibratory sensation.  Coordination: Rapid alternating movements normal in all extremities. Finger-to-nose and heel-to-shin performed accurately bilaterally. Gait and Station: [employer]  from chair without difficulty. Stance is normal. Gait demonstrates normal stride length and balance . Able to heel, toe and tandem walk with moderate difficulty.  Reflexes: 1+ and symmetric. Toes downgoing.       11/29/2022    3:41 PM 03/16/2022    1:05 PM 09/16/2021   10:38 AM  MMSE - Mini Mental State Exam  Orientation to time _0 Orientation to time comments said winter instead fall    Orientation to Place _1 Registration _2 Attention/ Calculation 3 0 2  Recall _3 Language- name 2 objects _4 Language- repeat 0 0 1   Language- follow 3 step command _5 Language- read & follow direction _6 Write a sentence _7 Copy design 0 0 1  Total score _8 ASSESSMENT/PLAN: 76 year old pleasant Caucasian lady with transient episode of vision and cognitive difficulties, numbness possibly posterior circulation TIAs versus atypical migraine.  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  Mild cognitive impairment -Subjectively, memory stable -MMSE 25/30 (prior 21/30) -can consider use of Namenda or Aricept in the future if needed but as cognition has been stable and nonprogressive, would recommend holding off at this time as she does have sensitivities to multiple medications -participation in cognitively challenging activities like solving crossword puzzles, playing bridge and sudoku.   -Continue memory compensation strategies.    2.  History of TIAs -Continue aspirin and Zetia for stroke prevention and maintain aggressive risk factor modification strict control of hypertension with blood pressure goal below 130/90, lipids with LDL cholesterol goal below 70 mg percent and diabetes with hemoglobin A1c goal below 6.5%   Follow-up in 6 months or call earlier if needed    I spent 31 minutes of face-to-face and non-face-to-face time with patient and daughters.  This included previsit chart review, lab review, study review, order entry, electronic health record documentation, patient and daughters education and discussion regarding above diagnoses and treatment plan and answered all the questions to patient and daughter satisfaction  Frann Rider, [facility]  [facility] [address] [place], [name] [address]  Phone [phone] Fax [phone] Note: This document was prepared with digital dictation and possible smart phrase technology. Any transcriptional errors that result from this process  are unintentional.

## 2022-11-29 ENCOUNTER — Telehealth: Payer: Self-pay | Admitting: Family Medicine

## 2022-11-29 ENCOUNTER — Other Ambulatory Visit: Payer: Medicare Other

## 2022-11-29 ENCOUNTER — Other Ambulatory Visit (INDEPENDENT_AMBULATORY_CARE_PROVIDER_SITE_OTHER): Payer: Medicare Other

## 2022-11-29 ENCOUNTER — Ambulatory Visit (INDEPENDENT_AMBULATORY_CARE_PROVIDER_SITE_OTHER): Payer: Medicare Other | Admitting: Adult Health

## 2022-11-29 ENCOUNTER — Encounter: Payer: Self-pay | Admitting: Adult Health

## 2022-11-29 VITALS — BP 156/84 | HR 81 | Ht 62.0 in | Wt 138.0 lb

## 2022-11-29 DIAGNOSIS — G459 Transient cerebral ischemic attack, unspecified: Secondary | ICD-10-CM

## 2022-11-29 DIAGNOSIS — G3184 Mild cognitive impairment, so stated: Secondary | ICD-10-CM

## 2022-11-29 DIAGNOSIS — E119 Type 2 diabetes mellitus without complications: Secondary | ICD-10-CM

## 2022-11-29 DIAGNOSIS — E039 Hypothyroidism, unspecified: Secondary | ICD-10-CM

## 2022-11-29 DIAGNOSIS — E559 Vitamin D deficiency, unspecified: Secondary | ICD-10-CM

## 2022-11-29 LAB — COMPREHENSIVE METABOLIC PANEL
ALT: 13 U/L (ref 0–35)
AST: 18 U/L (ref 0–37)
Albumin: 4.5 g/dL (ref 3.5–5.2)
Alkaline Phosphatase: 58 U/L (ref 39–117)
BUN: 20 mg/dL (ref 6–23)
CO2: 30 mEq/L (ref 19–32)
Calcium: 10 mg/dL (ref 8.4–10.5)
Chloride: 103 mEq/L (ref 96–112)
Creatinine, Ser: 0.65 mg/dL (ref 0.40–1.20)
GFR: 84.44 mL/min (ref >60.00)
Glucose, Bld: 100 mg/dL — ABNORMAL HIGH (ref 70–99)
Potassium: 4.5 mEq/L (ref 3.5–5.1)
Sodium: 139 mEq/L (ref 135–145)
Total Bilirubin: 0.5 mg/dL (ref 0.2–1.2)
Total Protein: 7.4 g/dL (ref 6.0–8.3)

## 2022-11-29 LAB — LIPID PANEL
Cholesterol: 185 mg/dL (ref 0–200)
HDL: 52 mg/dL (ref >39.00)
LDL Cholesterol: 106 mg/dL — ABNORMAL HIGH (ref 0–99)
NonHDL: 133.19
Total CHOL/HDL Ratio: 4
Triglycerides: 138 mg/dL (ref 0.0–149.0)
VLDL: 27.6 mg/dL (ref 0.0–40.0)

## 2022-11-29 LAB — HEMOGLOBIN A1C: Hgb A1c MFr Bld: 6.7 % — ABNORMAL HIGH (ref 4.6–6.5)

## 2022-11-29 LAB — MICROALBUMIN / CREATININE URINE RATIO
Creatinine,U: 60.6 mg/dL
Microalb Creat Ratio: 1.2 mg/g (ref 0.0–30.0)
Microalb, Ur: 0.7 mg/dL (ref 0.0–1.9)

## 2022-11-29 NOTE — Patient Instructions (Addendum)
Continue aspirin 81 mg daily  and Zetia for secondary stroke prevention  Continue to follow up with PCP regarding blood pressure and cholesterol management  Maintain strict control of hypertension with blood pressure goal below 130/90 and cholesterol with LDL cholesterol (bad cholesterol) goal below 70 mg/dL.   Signs of a Stroke? Follow the BEFAST method:  Balance Watch for a sudden loss of balance, trouble with coordination or vertigo Eyes Is there a sudden loss of vision in one or both eyes? Or double vision?  Face: Ask the person to smile. Does one side of the face droop or is it numb?  Arms: Ask the person to raise both arms. Does one arm drift downward? Is there weakness or numbness of a leg? Speech: Ask the person to repeat a simple phrase. Does the speech sound slurred/strange? Is the person confused ? Time: If you observe any of these signs, call 911.     Followup in the future with me in 6 months with Dr. Leonie Man or call earlier if needed       Thank you for coming to see [place] at [facility]. I hope we have been able to provide you high quality care today.  You may receive a patient satisfaction survey over the next few weeks. We would appreciate your feedback and comments so that we may continue to improve ourselves and the health of our patients.

## 2022-11-29 NOTE — Telephone Encounter (Signed)
-----   Message from Velna Hatchet, RT sent at 11/15/2022 10:50 AM EST ----- Regarding: Mon 12/4 lab Lab orders needed for [facility] visit on 11/29/22.  Thanks, Anda Kraft

## 2022-11-30 ENCOUNTER — Ambulatory Visit: Payer: Medicare Other | Attending: Orthopedic Surgery | Admitting: Physical Therapy

## 2022-11-30 DIAGNOSIS — M6281 Muscle weakness (generalized): Secondary | ICD-10-CM | POA: Insufficient documentation

## 2022-11-30 DIAGNOSIS — M25611 Stiffness of right shoulder, not elsewhere classified: Secondary | ICD-10-CM | POA: Diagnosis not present

## 2022-11-30 DIAGNOSIS — M25511 Pain in right shoulder: Secondary | ICD-10-CM | POA: Insufficient documentation

## 2022-11-30 NOTE — Progress Notes (Signed)
Subjective:  Patient ID: Renee [name], female    DOB: 03/19/1946,  MRN: [id number]  Chief Complaint  Patient presents with   Nail Problem    Nail trim     76 y.o. female presents with the above complaint.  Patient presents with complaint of bilateral fifth metatarsal hyperkeratotic lesion.  Patient stated they are still very painful but the debridement does help.  She is in agreement thickened elongated dystrophic toenails x10 mild pain on palpation she would like for them to debride down to.   Review of Systems: Negative except as noted in the HPI. Denies N/V/F/Ch.  Past Medical History:  Diagnosis Date   Allergy    Anxiety    Arthritis    CAD (coronary artery disease)    non-obstructive. cath 11/11   Claustrophobia    Diabetes mellitus    borderline diabetic - diet controlled    GERD (gastroesophageal reflux disease)    History of chicken pox    History of phobia    clastrophobia   Hyperlipidemia    Hypertension    Hypothyroidism    Kidney stones    hx of    Lichen sclerosus    Psoriasis    Stroke ([place])    Urinary tract infection    hx of     Current Outpatient Medications:    acetaminophen (TYLENOL) 650 MG CR tablet, Take 1,300 mg by mouth every 8 (eight) hours as needed for pain., Disp: , Rfl:    aspirin EC 81 MG tablet, Take 81 mg by mouth daily., Disp: , Rfl:    Blood Glucose Monitoring Suppl (ONETOUCH VERIO) w/Device KIT, by Does not apply route. Use to check fasting blood sugar daily and an occasional 2 hr postprandial, Disp: , Rfl:    Carboxymethylcellulose Sodium (THERATEARS OP), Place 1 drop into both eyes at bedtime as needed (dry eyes)., Disp: , Rfl:    clobetasol ointment (TEMOVATE) 0.05 %, APPLY TOPICALLY DAILY AS NEEDED FOR PSORIASIS FLARES, Disp: 60 g, Rfl: 0   Coenzyme Q10 (COQ10) 200 MG CAPS, Take 200 mg by mouth daily., Disp: , Rfl:    Dulaglutide (TRULICITY) 9.24 QA/8.3MH SOPN, Inject 0.75 mg into the skin once a week. Via [employer] PAP, Disp: ,  Rfl:    Emollient (CERAVE) CREA, Apply 1 application  topically 2 (two) times daily as needed (psoriasis)., Disp: , Rfl:    ezetimibe (ZETIA) 10 MG tablet, Take 1 tablet (10 mg total) by mouth daily., Disp: 90 tablet, Rfl: 2   famotidine (PEPCID) 20 MG tablet, Take 20 mg by mouth daily as needed for heartburn or indigestion., Disp: , Rfl:    Flaxseed, Linseed, (FLAX SEED OIL PO), Take 1 capsule by mouth daily., Disp: , Rfl:    fluticasone (FLONASE) 50 MCG/ACT nasal spray, Place 1 spray into both nostrils daily as needed for allergies., Disp: , Rfl:    HYDROcodone-acetaminophen (NORCO/VICODIN) 5-325 MG tablet, Take 1 tablet by mouth every 4 (four) hours as needed for moderate pain., Disp: 20 tablet, Rfl: 0   Lancets (ONETOUCH ULTRASOFT) lancets, Use to check fasting blood sugar daily and an occasional 2 hr postprandial, Disp: 100 each, Rfl: 11   levothyroxine (SYNTHROID) 75 MCG tablet, TAKE 1 TABLET BY MOUTH EVERY DAY, Disp: 90 tablet, Rfl: 3   losartan (COZAAR) 50 MG tablet, TAKE 1 TABLET BY MOUTH EVERY DAY, Disp: 90 tablet, Rfl: 1   MAGNESIUM PO, Take 1 tablet by mouth daily., Disp: , Rfl:    Multiple  Vitamin (MULTIVITAMIN) tablet, Take 1 tablet by mouth daily., Disp: , Rfl:    nitroGLYCERIN (NITROSTAT) 0.4 MG SL tablet, Place 1 tablet (0.4 mg total) under the tongue every 5 (five) minutes as needed., Disp: 25 tablet, Rfl: 1   ondansetron (ZOFRAN) 4 MG tablet, Take 1 tablet (4 mg total) by mouth every 8 (eight) hours as needed for nausea or vomiting., Disp: 10 tablet, Rfl: 0   ONETOUCH VERIO test strip, USE TO CHECK FASTING BLOOD SUGAR DAILY AND AN OCCASIONAL 2 HR POSTPRANDIAL., Disp: 100 strip, Rfl: 11   Skin Protectants, Misc. (EUCERIN) cream, Apply 1 application  topically as needed (eczema)., Disp: , Rfl:    tiZANidine (ZANAFLEX) 4 MG tablet, Take 1 tablet (4 mg total) by mouth every 8 (eight) hours as needed for muscle spasms. (Patient not taking: Reported on 10/28/2022), Disp: 30 tablet, Rfl:  1   triamcinolone cream (KENALOG) 0.1 %, Apply 1 application topically 2 (two) times daily as needed (psoriasis). (Patient not taking: Reported on 10/28/2022), Disp: 454 g, Rfl: 0   Vitamin D, Cholecalciferol, 25 MCG (1000 UT) CAPS, Take 2,000 Units by mouth daily., Disp: , Rfl:   Social History   Tobacco Use  Smoking Status Never  Smokeless Tobacco Never    Allergies  Allergen Reactions   Celexa [Citalopram] Shortness Of Breath and Swelling    Throat swelling and difficulty breathing   Other Rash    Metal    Codeine Nausea And Vomiting   Metformin Nausea And Vomiting   Robaxin [Methocarbamol] Nausea Only   Crestor [Rosuvastatin] Rash   Nickel Rash   Simvastatin Swelling and Rash   Objective:  There were no vitals filed for this visit. There is no height or weight on file to calculate BMI. Constitutional Well developed. Well nourished.  Vascular Dorsalis pedis pulses palpable bilaterally. Posterior tibial pulses palpable bilaterally. Capillary refill normal to all digits.  No cyanosis or clubbing noted. Pedal hair growth normal.  Neurologic Normal speech. Oriented to person, place, and time. Epicritic sensation to light touch grossly present bilaterally.  Dermatologic Nail Exam: Pt has thick disfigured discolored nails with subungual debris noted bilateral entire nail hallux through fifth toenails.  Pain on palpation to the nails. No open wounds. No skin lesions.  Orthopedic: Bilateral submetatarsal 5 porokeratotic/benign skin lesion with central nucleated core noted.  Upon debridement no pinpoint bleeding noted.  No wounds noted.   Radiographs:   3 views of skeletally mature adult bilateral foot: Midfoot arthritis noted.  Plantar and posterior heel spurring noted.  Plantarflexed fifth metatarsal noted with mild signs of tailor's bunion.  There is increase in lateral deviation angle. Assessment:   1. Porokeratosis   2. Pain due to onychomycosis of toenails of both feet        Plan:  Patient was evaluated and treated and all questions answered.  Bilateral submetatarsal 5 porokeratosis x2 -X-ray to the patient the etiology of porokeratosis and was treatment options were extensively discussed.  Given the amount of pain she is having I believe she will benefit from debridement of the lesion followed by excision of the lesion.  I discussed this with the patient she states understanding like to proceed with that. -Using chisel blade and handle the lesion was debrided down to healthy striated tissue followed by excision of central nucleated core.  Immediate pain was noted after debridement and excision.  No pinpoint bleeding noted.  Onychomycosis with pain  -Nails palliatively debrided as below. -Educated on self-care  Procedure:  Nail Debridement Rationale: pain  Type of Debridement: manual, sharp debridement. Instrumentation: Nail nipper, rotary burr. Number of Nails: 10  Procedures and Treatment: Consent by patient was obtained for treatment procedures. The patient understood the discussion of treatment and procedures well. All questions were answered thoroughly reviewed. Debridement of mycotic and hypertrophic toenails, 1 through 5 bilateral and clearing of subungual debris. No ulceration, no infection noted.  Return Visit-Office Procedure: Patient instructed to return to the office for a follow up visit 3 months for continued evaluation and treatment.  Boneta Lucks, DPM    No follow-ups on file.     No follow-ups on file.

## 2022-11-30 NOTE — Progress Notes (Signed)
No critical labs need to be addressed urgently. We will discuss labs in detail at upcoming office visit.   

## 2022-11-30 NOTE — Therapy (Signed)
OUTPATIENT PHYSICAL THERAPY TREATMENT NOTE   Patient Name: Renee [name] MRN: [id number] DOB:1946/03/11, 76 y.o., female Today's Date: 11/30/2022  PCP: Jinny Sanders, MD  REFERRING PROVIDER: Jenetta Loges, PA-C   END OF SESSION:   PT End of Session - 11/30/22 1431     Visit Number 4    Number of Visits 17    Date for PT Re-Evaluation 01/13/23    Authorization Type Medicare 2023    Progress Note Due on Visit 10    PT Start Time 1436    PT Stop Time 1521    PT Time Calculation (min) 45 min    Activity Tolerance No increased pain    Behavior During Therapy WFL for tasks assessed/performed              Past Medical History:  Diagnosis Date   Allergy    Anxiety    Arthritis    CAD (coronary artery disease)    non-obstructive. cath 11/11   Claustrophobia    Diabetes mellitus    borderline diabetic - diet controlled    GERD (gastroesophageal reflux disease)    History of chicken pox    History of phobia    clastrophobia   Hyperlipidemia    Hypertension    Hypothyroidism    Kidney stones    hx of    Lichen sclerosus    Psoriasis    Stroke ([place])    Urinary tract infection    hx of    Past Surgical History:  Procedure Laterality Date   APPENDECTOMY     CARDIAC CATHETERIZATION     2011   CARPAL TUNNEL RELEASE     Left trigger finger   CATARACT EXTRACTION W/PHACO Left 06/06/2018   Procedure: CATARACT EXTRACTION PHACO AND INTRAOCULAR LENS PLACEMENT ([place]);  Surgeon: Birder Robson, MD;  Location: [facility] ORS;  Service: Ophthalmology;  Laterality: Left;  [place]  00:23 AP% 16.0 CDE 3.69 Fluid pack lot # [id number]   CATARACT EXTRACTION W/PHACO Right 09/19/2018   Procedure: CATARACT EXTRACTION PHACO AND INTRAOCULAR LENS PLACEMENT ([facility]);  Surgeon: Birder Robson, MD;  Location: [facility] ORS;  Service: Ophthalmology;  Laterality: Right;  [place] 00:29.5 AP% 17.0 CDE$ 5.03 Fluid pack lot # [id number]   COLONOSCOPY WITH PROPOFOL N/A 05/13/2017   Procedure: COLONOSCOPY WITH  PROPOFOL;  Surgeon: Manya Silvas, MD;  Location[facility]COPY;  Service: Endoscopy;  Laterality: N/A;   ESOPHAGOGASTRODUODENOSCOPY (EGD) WITH PROPOFOL N/A 01/04/2020   Procedure: ESOPHAGOGASTRODUODENOSCOPY (EGD) WITH PROPOFOL;  Surgeon: [name], MD;  Location: [facility] ENDOSCOPY;  Service: Gastroenterology;  Laterality: N/A;   EYE SURGERY  2004   lasik - right eye    JOINT REPLACEMENT     KNEE SURGERY Right 2015   arthroscopy   REVERSE SHOULDER ARTHROPLASTY Right 10/21/2022   Procedure: REVERSE SHOULDER ARTHROPLASTY;  Surgeon: Justice Britain, MD;  Location: [facility] ORS;  Service: Orthopedics;  Laterality: Right;  1108mn   TOTAL KNEE ARTHROPLASTY Bilateral 06/12/2015   Procedure: TOTAL KNEE BILATERAL;  Surgeon: FGaynelle Arabian MD;  Location: [facility] ORS;  Service: Orthopedics;  Laterality: Bilateral;  and epidural   TUBAL LIGATION     Tubes tied     and untied. and tied again   Patient Active Problem List   Diagnosis Date Noted   Statin myopathy 06/04/2022   Hordeolum externum of left upper eyelid 02/17/2022   Acute gout involving toe of right foot 08/12/2021   Chronic pain of both shoulders 08/12/2021   History of TIA (transient ischemic  attack) 06/26/2021   Statin intolerance 01/15/2021   MDD (major depressive disorder), recurrent episode, moderate (HCC) 07/08/2020   Chronic insomnia 07/08/2020   Chronic neck pain 10/05/2019   Chronic diarrhea 03/30/2018   GAD (generalized anxiety disorder) 01/31/2018   Osteopenia 12/08/2017   Vitamin D deficiency 03/04/2016   Allergic rhinitis 03/04/2016   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)  ([place]) 10/20/2015   External hemorrhoid 10/20/2015   Psoriasis 10/20/2015   History of bilateral knee replacement 10/20/2015   Osteoarthritis of knee 11/25/2014   Hypertension associated with diabetes ([place]) 11/06/2010   CAD, NATIVE VESSEL 11/06/2010   Hyperlipidemia associated with type 2 diabetes mellitus ([place]) 01/25/2006    Adult hypothyroidism 11/15/2005    REFERRING DIAG: Z96.619 (ICD-10-CM) - Presence of unspecified artificial shoulder joint    THERAPY DIAG:  Acute pain of right shoulder  Stiffness of right shoulder, not elsewhere classified  Muscle weakness (generalized)  Rationale for Evaluation and Treatment Rehabilitation  PERTINENT HISTORY: Pt is 76 year old female s/p right reverse TSA 10/21/22. Patient reports performing home exercises given by [facility] health including pendulums, elbow AROM, wrist AROM, and sliding hand on her lap. Patient reports more pain rTSA compared to bilat TKA. Pt has been released from using her sling; pt uses only for going out in public and walking outside. Pt denies significant post-operative complications.    Pain:  Pain Intensity: Present: 3-4/10, Best: 1-2/10, Worst: 8-9/10 Pain location: Lateral arm and down to R elbow Pain Quality: aching  Radiating: Yes , down to R elbow Numbness/Tingling: Yes, NT down to her R fingers, affecting all digits  Aggravating factors: trying to lie down, raising arm inadvertently Relieving factors: avoiding unnecessary R shoulder activity  History of prior shoulder or neck/shoulder injury, pain, surgery, or therapy: Yes, PT prior to rTSA  Falls: Has patient fallen in last 6 months? No, Number of falls: N/A Dominant hand: left Imaging: Yes , X-rays post-operatively demonstrated good healing, no hardware issues   Prior level of function: Independent Occupational demands: N/A Hobbies:  walking exercise regimen Red flags (personal history of cancer, chills/fever, night sweats, nausea, vomiting, unrelenting pain): Negative   Precautions: rTSA post-op precautions   Weight Bearing Restrictions: No   Living Environment Lives with: lives alone, children live in [place] close to her; 4 daughters, 2 sons Lives in: House/apartment     Patient Goals: Return to walking exercise regimen, able to travel/drive as needed    PRECAUTIONS:  ER to 45, Abduction to 90, Flexion to 115 deg (weeks 4-8)  Right Reverse TSA, DOS: 10/21/22   SUBJECTIVE:  SUBJECTIVE STATEMENT:  Patient reports no notable pain. She had follow-up with referring provider's office and was given Rx for pain when going to bed. Pt reports compliance with her HEP. She feels that pain in the evening is gradually getting better; minimal pain throughout the day.   PAIN:  Are you having pain? No   OBJECTIVE: (objective measures completed at initial evaluation unless otherwise dated)   Patient Surveys  FOTO: 5, predicted outcome score of 68   Cognition Patient is oriented to person, place, and time.  Recent memory is intact.  Remote memory is intact.  Attention span and concentration are intact.  Expressive speech is intact.  Patient's fund of knowledge is within normal limits for educational level.                          Gross Musculoskeletal Assessment Tremor: None Bulk: Normal Tone: Normal     Posture Forward head, rounded shoulders, inc thoracic kyphosis       AROM       AROM (Normal range in degrees) AROM 11/17/2022    Right Left  Shoulder      Flexion Deferred Deferred  Extension Deferred Deferred  Abduction Deferred Deferred  External Rotation Deferred Deferred  Internal Rotation Deferred Deferred  Hands Behind Head Deferred Deferred  Hands Behind Back Deferred Deferred         Elbow      Flexion WNL WNL  Extension -15 WNL  Pronation WNL WNL  Supination WNL WNL  (* = pain; Blank rows = not tested)     Shoulder PROM Flexion: R 90 deg Abduction: R 40 deg ER: R 30 deg     LE MMT: MMTs deferred due to post-operative status* MMT (out of 5) Right 11/17/2022 Left 11/17/2022         Shoulder   Flexion      Extension      Abduction       External rotation      Internal rotation      Horizontal abduction      Horizontal adduction      Lower Trapezius      Rhomboids             Elbow  Flexion      Extension      Pronation      Supination             Wrist  Flexion      Extension      Radial deviation      Ulnar deviation             MCP  Flexion      Extension      Abduction      Adduction      (* = pain; Blank rows = not tested)   Sensation Grossly intact to light touch bilateral UE as determined by testing dermatomes C2-T2. Proprioception and hot/cold testing deferred on this date.     Palpation   Location LEFT  RIGHT           Subocciptials      Cervical paraspinals      Upper Trapezius   1  Levator Scapulae      Rhomboid Major/Minor      Sternoclavicular joint      Acromioclavicular joint      Coracoid process      Long head of biceps  Supraspinatus      Infraspinatus      Subscapularis      Teres Minor      Teres Major      Pectoralis Major   0  Pectoralis Minor   0  Anterior Deltoid   1  Lateral Deltoid   1  Posterior Deltoid   1  Latissimus Dorsi      Sternocleidomastoid      (Blank rows = not tested) Graded on 0-4 scale (0 = no pain, 1 = pain, 2 = pain with wincing/grimacing/flinching, 3 = pain with withdrawal, 4 = unwilling to allow palpation), (Blank rows = not tested)     Accessory Motions/Glides Deferred       TODAY'S TREATMENT      Manual Therapy - for symptom modulation, soft tissue sensitivity and mobility, shoulder ROM  STM R middle and anterior deltoid, biceps; x 2 minutes R shoulder PROM flexion, ER, IR hand to abdomen, and abd up to limits of protocol; x 10 minutes     Therapeutic Exercise - for shoulder complex ROM as needed for functional reaching and household chores;    Supine; Wand flexion AAROM; 1x10 - verbal cueing for safe ROM, forward elevation to 115  Supine flexion AROM, 20x - tactile and verbal cueing for neutral forearm prior to  initiation of AROM;   forward elevation to 115  Sidelying shoulder abduction to 90 deg; L sidelying; 2x10    Finger ladder, forward elevation; x10   Standing Tband row; Red Tband; x20  -heavy verbal cueing and demonstration to avoid shoulder extension beyond neutral  Shoulder isometrics, flexion, abduction and ER; x15, 5 sec hold  -moderate verbal cueing and demonstration for technique and limited force with submaximal isometrics   *not today* Supine; Wand ER AAROM; to 45 deg only; 2x10  Shoulder ER with elbow on tabletop, in sitting; x20 Pulley, forward elevation; x 2 minutes         PATIENT EDUCATION:  Education details: Plan of care, HEP Person educated: Patient Education method: Explanation Education comprehension: verbalized understanding     HOME EXERCISE PROGRAM: Exercises - Supine Shoulder Flexion AAROM with dowel (up to 90 deg only); 2x10, 2x/day -Gripping, theraputty; 2-3 min; 2x/day -Pt continuing HEP given from referring clinic (elbow AROM, pendulums, wrist and digit AROM)       ASSESSMENT:   CLINICAL IMPRESSION: Patient arrives with excellent motivation to participate in physical therapy.  Pt is not notably challenged with supine AROM. She is able to attain 115 deg flexion with active flexion in upright position utilizing finger ladder today. She is making progress with pain control and is getting better with pain late in the day. Modestly progressed with light periscapular work today and continued isometrics without notable discomfort and sound technique following moderate verbal/tactile cueing for exercise form. Patient will benefit from continued skilled therapeutic intervention to address the above deficits as needed for improved function and QoL.    REHAB POTENTIAL: Good   CLINICAL DECISION MAKING: Stable/uncomplicated   EVALUATION COMPLEXITY: Low     GOALS: Goals reviewed with patient? Yes   SHORT TERM GOALS: Target date: 12/15/2022   Pt will  be independent with HEP to improve strength and decrease neck pain to improve pain-free function at home and work. Baseline: 11/16/22: Baseline HEP initiated, HEP previously given from referring office reviewed.   Goal status: INITIAL     LONG TERM GOALS: Target date: 01/12/2023   Pt will increase FOTO to at least  53 to demonstrate significant improvement in function at home and work related to neck pain  Baseline: 11/16/22: FOTO 29.   Goal status: INITIAL   2.  Pt will decrease worst shoulder pain by at least 3 points on the NPRS in order to demonstrate clinically significant reduction in shoulder pain. Baseline: 11/16/22: 8-9/10 at worst Goal status: INITIAL   3.   Patient will have shoulder flexion AROM to at least 130 deg as needed for functional reaching, self-care activities, household chores Baseline: 11/16/22: R shoulder flexion AROM not tested due to post-op status, flexion PROM to 90 deg (up to current restriction).    Goal status: INITIAL   4. Pt will increase strength of tested shoulder musculature to at least 4/5 or greater MMT grade in order to demonstrate improvement in strength and function as needed for light to moderate lifting and household chores          Baseline: 11/16/22: MMT 2-/5 globally given decreased shoulder ROM.  Goal status: INITIAL     PLAN: PT FREQUENCY: 2x/week   PT DURATION: 8 weeks   PLANNED INTERVENTIONS: Therapeutic exercises, Therapeutic activity, Neuromuscular re-education, Balance training, Gait training, Patient/Family education, Joint manipulation, Joint mobilization, Dry Needling, Electrical stimulation, Cryotherapy, Moist heat, Traction, Ultrasound, Ionotophoresis '4mg'$ /ml Dexamethasone, and Manual therapy   PLAN FOR NEXT SESSION: Gentle R shoulder PROM within pt tolerance and up to limits of restrictions per protocol. Continue with AAROM and AROM as tolerated within post-operative guidelines. Continue with isometrics and gentle periscapular  isotonics without R shoulder hyperextension.     Valentina Gu, PT, DPT #[id number]  Eilleen Kempf, PT 11/30/2022, 3:32 PM

## 2022-12-01 ENCOUNTER — Ambulatory Visit: Payer: Medicare Other

## 2022-12-02 ENCOUNTER — Ambulatory Visit: Payer: Medicare Other | Admitting: Physical Therapy

## 2022-12-02 ENCOUNTER — Encounter: Payer: Self-pay | Admitting: Physical Therapy

## 2022-12-02 ENCOUNTER — Telehealth: Payer: Self-pay

## 2022-12-02 DIAGNOSIS — M25511 Pain in right shoulder: Secondary | ICD-10-CM | POA: Diagnosis not present

## 2022-12-02 DIAGNOSIS — M6281 Muscle weakness (generalized): Secondary | ICD-10-CM

## 2022-12-02 DIAGNOSIS — M25611 Stiffness of right shoulder, not elsewhere classified: Secondary | ICD-10-CM | POA: Diagnosis not present

## 2022-12-02 NOTE — Therapy (Signed)
OUTPATIENT PHYSICAL THERAPY TREATMENT NOTE   Patient Name: Renee [name] MRN: [id number] DOB:Mar 12, 1946, 76 y.o., female Today's Date: 12/02/2022  PCP: Jinny Sanders, MD  REFERRING PROVIDER: Jenetta Loges, PA-C   END OF SESSION:   PT End of Session - 12/02/22 1434     Visit Number 5    Number of Visits 17    Date for PT Re-Evaluation 01/13/23    Authorization Type Medicare 2023    Progress Note Due on Visit 10    PT Start Time 1436    PT Stop Time 1517    PT Time Calculation (min) 41 min    Activity Tolerance No increased pain    Behavior During Therapy WFL for tasks assessed/performed               Past Medical History:  Diagnosis Date   Allergy    Anxiety    Arthritis    CAD (coronary artery disease)    non-obstructive. cath 11/11   Claustrophobia    Diabetes mellitus    borderline diabetic - diet controlled    GERD (gastroesophageal reflux disease)    History of chicken pox    History of phobia    clastrophobia   Hyperlipidemia    Hypertension    Hypothyroidism    Kidney stones    hx of    Lichen sclerosus    Psoriasis    Stroke ([place])    Urinary tract infection    hx of    Past Surgical History:  Procedure Laterality Date   APPENDECTOMY     CARDIAC CATHETERIZATION     2011   CARPAL TUNNEL RELEASE     Left trigger finger   CATARACT EXTRACTION W/PHACO Left 06/06/2018   Procedure: CATARACT EXTRACTION PHACO AND INTRAOCULAR LENS PLACEMENT ([place]);  Surgeon: Birder Robson, MD;  Location: [facility];  Service: Ophthalmology;  Laterality: Left;  [place]  00:23 AP% 16.0 CDE 3.69 Fluid pack lot # [id number]   CATARACT EXTRACTION W/PHACO Right 09/19/2018   Procedure: CATARACT EXTRACTION PHACO AND INTRAOCULAR LENS PLACEMENT (I[facility]);  Surgeon: Birder Robson, MD;  Location: [facility];  Service: Ophthalmology;  Laterality: Right;  [place] 00:29.5 AP% 17.0 CDE$ 5.03 Fluid pack lot # [id number]   COLONOSCOPY WITH PROPOFOL N/A 05/13/2017   Procedure: COLONOSCOPY WITH  PROPOFOL;  Surgeon: Manya Silvas, MD;  Location: [facility] ENDOSCOPY;  Service: Endoscopy;  Laterality: N/A;   ESOPHAGOGASTRODUODENOSCOPY (EGD) WITH PROPOFOL N/A 01/04/2020   Procedure: ESOPHAGOGASTRODUODENOSCOPY (EGD) WITH PROPOFOL;  Surgeon: [name], MD;  Location: [facility] ENDOSCOPY;  Service: Gastroenterology;  Laterality: N/A;   EYE SURGERY  2004   lasik - right eye    JOINT REPLACEMENT     KNEE SURGERY Right 2015   arthroscopy   REVERSE SHOULDER ARTHROPLASTY Right 10/21/2022   Procedure: REVERSE SHOULDER ARTHROPLASTY;  Surgeon: Justice Britain, MD;  Location: [facility];  Service: Orthopedics;  Laterality: Right;  178mn   TOTAL KNEE ARTHROPLASTY Bilateral 06/12/2015   Procedure: TOTAL KNEE BILATERAL;  Surgeon: FGaynelle Arabian MD;  Location: [facility];  Service: Orthopedics;  Laterality: Bilateral;  and epidural   TUBAL LIGATION     Tubes tied     and untied. and tied again   Patient Active Problem List   Diagnosis Date Noted   Pain in joint of left shoulder 11/03/2022   Statin myopathy 06/04/2022   Hordeolum externum of left upper eyelid 02/17/2022   Acute gout involving toe of right foot 08/12/2021   Chronic pain of  both shoulders 08/12/2021   History of TIA (transient ischemic attack) 06/26/2021   Statin intolerance 01/15/2021   MDD (major depressive disorder), recurrent episode, moderate (HCC) 07/08/2020   Chronic insomnia 07/08/2020   Chronic neck pain 10/05/2019   Chronic diarrhea 03/30/2018   GAD (generalized anxiety disorder) 01/31/2018   Osteopenia 12/08/2017   Vitamin D deficiency 03/04/2016   Allergic rhinitis 03/04/2016   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) 11/06/2010   CAD, NATIVE VESSEL 11/06/2010   Hyperlipidemia associated with type  2 diabetes mellitus ([place]) 01/25/2006   Adult hypothyroidism 11/15/2005    REFERRING DIAG: Z96.619 (ICD-10-CM) - Presence of unspecified artificial shoulder joint    THERAPY DIAG:  Acute pain of right shoulder  Stiffness of right shoulder, not elsewhere classified  Muscle weakness (generalized)  Rationale for Evaluation and Treatment Rehabilitation  PERTINENT HISTORY: Pt is 76 year old female s/p right reverse TSA 10/21/22. Patient reports performing home exercises given by [facility] health including pendulums, elbow AROM, wrist AROM, and sliding hand on her lap. Patient reports more pain rTSA compared to bilat TKA. Pt has been released from using her sling; pt uses only for going out in public and walking outside. Pt denies significant post-operative complications.    Pain:  Pain Intensity: Present: 3-4/10, Best: 1-2/10, Worst: 8-9/10 Pain location: Lateral arm and down to R elbow Pain Quality: aching  Radiating: Yes , down to R elbow Numbness/Tingling: Yes, NT down to her R fingers, affecting all digits  Aggravating factors: trying to lie down, raising arm inadvertently Relieving factors: avoiding unnecessary R shoulder activity  History of prior shoulder or neck/shoulder injury, pain, surgery, or therapy: Yes, PT prior to rTSA  Falls: Has patient fallen in last 6 months? No, Number of falls: N/A Dominant hand: left Imaging: Yes , X-rays post-operatively demonstrated good healing, no hardware issues   Prior level of function: Independent Occupational demands: N/A Hobbies:  walking exercise regimen Red flags (personal history of cancer, chills/fever, night sweats, nausea, vomiting, unrelenting pain): Negative   Precautions: rTSA post-op precautions   Weight Bearing Restrictions: No   Living Environment Lives with: lives alone, children live in [place] close to her; 4 daughters, 2 sons Lives in: House/apartment     Patient Goals: Return to walking exercise regimen, able to  travel/drive as needed    PRECAUTIONS: ER to 45, Abduction to 90, Flexion to 115 deg (weeks 4-8)  Right Reverse TSA, DOS: 10/21/22   SUBJECTIVE:  SUBJECTIVE STATEMENT:  Patient reports general aching affecting arms and elbows with cold weather; pt attributes this to arthritis. She reports that pain is mild at 1/10. She reports no issues following last visit.   PAIN:  Are you having pain? Yes, 1/10 pain in R arm    OBJECTIVE: (objective measures completed at initial evaluation unless otherwise dated)   Patient Surveys  FOTO: 62, predicted outcome score of 85   Cognition Patient is oriented to person, place, and time.  Recent memory is intact.  Remote memory is intact.  Attention span and concentration are intact.  Expressive speech is intact.  Patient's fund of knowledge is within normal limits for educational level.                          Gross Musculoskeletal Assessment Tremor: None Bulk: Normal Tone: Normal     Posture Forward head, rounded shoulders, inc thoracic kyphosis       AROM       AROM (Normal range in degrees) AROM 11/17/2022    Right Left  Shoulder      Flexion Deferred Deferred  Extension Deferred Deferred  Abduction Deferred Deferred  External Rotation Deferred Deferred  Internal Rotation Deferred Deferred  Hands Behind Head Deferred Deferred  Hands Behind Back Deferred Deferred         Elbow      Flexion WNL WNL  Extension -15 WNL  Pronation WNL WNL  Supination WNL WNL  (* = pain; Blank rows = not tested)     Shoulder PROM Flexion: R 90 deg Abduction: R 40 deg ER: R 30 deg     LE MMT: MMTs deferred due to post-operative status* MMT (out of 5) Right 11/17/2022 Left 11/17/2022         Shoulder   Flexion      Extension      Abduction      External  rotation      Internal rotation      Horizontal abduction      Horizontal adduction      Lower Trapezius      Rhomboids             Elbow  Flexion      Extension      Pronation      Supination             Wrist  Flexion      Extension      Radial deviation      Ulnar deviation             MCP  Flexion      Extension      Abduction      Adduction      (* = pain; Blank rows = not tested)   Sensation Grossly intact to light touch bilateral UE as determined by testing dermatomes C2-T2. Proprioception and hot/cold testing deferred on this date.     Palpation   Location LEFT  RIGHT           Subocciptials      Cervical paraspinals      Upper Trapezius   1  Levator Scapulae      Rhomboid Major/Minor      Sternoclavicular joint      Acromioclavicular joint      Coracoid process      Long head of biceps      Supraspinatus      Infraspinatus  Subscapularis      Teres Minor      Teres Major      Pectoralis Major   0  Pectoralis Minor   0  Anterior Deltoid   1  Lateral Deltoid   1  Posterior Deltoid   1  Latissimus Dorsi      Sternocleidomastoid      (Blank rows = not tested) Graded on 0-4 scale (0 = no pain, 1 = pain, 2 = pain with wincing/grimacing/flinching, 3 = pain with withdrawal, 4 = unwilling to allow palpation), (Blank rows = not tested)     Accessory Motions/Glides Deferred       TODAY'S TREATMENT      Manual Therapy - for symptom modulation, soft tissue sensitivity and mobility, shoulder ROM  STM R middle and anterior deltoid, biceps; x 2 minutes R shoulder PROM flexion, ER, IR hand to abdomen, and abd up to limits of protocol; x 5 minutes     Therapeutic Exercise - for shoulder complex ROM as needed for functional reaching and household chores;    Supine flexion AROM, 20x - tactile and verbal cueing for neutral forearm prior to initiation of AROM;   forward elevation to 115  Sidelying shoulder abduction to 90 deg; L sidelying;  2x10    Finger ladder, forward elevation; x10   Wall wash for forward flexion; 2x8   Standing Tband row; Red Tband; x20  -heavy verbal cueing and demonstration to avoid shoulder extension beyond neutral     Shoulder isometrics, flexion, abduction and ER; x15, 5 sec hold  -moderate verbal cueing and demonstration for technique and limited force with submaximal isometrics   *not today* Supine; Wand flexion AAROM; 1x10 - verbal cueing for safe ROM, forward elevation to 115 Supine; Wand ER AAROM; to 45 deg only; 2x10  Shoulder ER with elbow on tabletop, in sitting; x20 Pulley, forward elevation; x 2 minutes         PATIENT EDUCATION:  Education details: Plan of care, HEP Person educated: Patient Education method: Explanation Education comprehension: verbalized understanding     HOME EXERCISE PROGRAM: Exercises - Supine Shoulder Flexion AAROM with dowel (up to 90 deg only); 2x10, 2x/day -Gripping, theraputty; 2-3 min; 2x/day -Pt continuing HEP given from referring clinic (elbow AROM, pendulums, wrist and digit AROM)       ASSESSMENT:   CLINICAL IMPRESSION: Patient is able to attain AAROM up to limits of protocol. Pt demonstrates active shoulder flexion in standing to 75 deg, active abduction in standing to 80 deg. Continued work on AROM today within limits of post-operative protocol to ensure protection of rTSA components. Pt is making fair progress in with phase of rehab, but she does need work on shoulder elevation AROM. Patient will benefit from continued skilled therapeutic intervention to address the above deficits as needed for improved function and QoL.    REHAB POTENTIAL: Good   CLINICAL DECISION MAKING: Stable/uncomplicated   EVALUATION COMPLEXITY: Low     GOALS: Goals reviewed with patient? Yes   SHORT TERM GOALS: Target date: 12/15/2022   Pt will be independent with HEP to improve strength and decrease neck pain to improve pain-free function at home and  work. Baseline: 11/16/22: Baseline HEP initiated, HEP previously given from referring office reviewed.   Goal status: INITIAL     LONG TERM GOALS: Target date: 01/12/2023   Pt will increase FOTO to at least 53 to demonstrate significant improvement in function at home and work related to neck pain  Baseline: 11/16/22: FOTO 29.   Goal status: INITIAL   2.  Pt will decrease worst shoulder pain by at least 3 points on the NPRS in order to demonstrate clinically significant reduction in shoulder pain. Baseline: 11/16/22: 8-9/10 at worst Goal status: INITIAL   3.   Patient will have shoulder flexion AROM to at least 130 deg as needed for functional reaching, self-care activities, household chores Baseline: 11/16/22: R shoulder flexion AROM not tested due to post-op status, flexion PROM to 90 deg (up to current restriction).    Goal status: INITIAL   4. Pt will increase strength of tested shoulder musculature to at least 4/5 or greater MMT grade in order to demonstrate improvement in strength and function as needed for light to moderate lifting and household chores          Baseline: 11/16/22: MMT 2-/5 globally given decreased shoulder ROM.  Goal status: INITIAL     PLAN: PT FREQUENCY: 2x/week   PT DURATION: 8 weeks   PLANNED INTERVENTIONS: Therapeutic exercises, Therapeutic activity, Neuromuscular re-education, Balance training, Gait training, Patient/Family education, Joint manipulation, Joint mobilization, Dry Needling, Electrical stimulation, Cryotherapy, Moist heat, Traction, Ultrasound, Ionotophoresis '4mg'$ /ml Dexamethasone, and Manual therapy   PLAN FOR NEXT SESSION: Gentle R shoulder PROM within pt tolerance and up to limits of restrictions per protocol. Continue with AAROM and AROM as tolerated within post-operative guidelines. Continue with isometrics and gentle periscapular isotonics without R shoulder hyperextension.     Valentina Gu, PT, DPT #[id number]  Eilleen Kempf,  PT 12/02/2022, 3:36 PM

## 2022-12-02 NOTE — Telephone Encounter (Signed)
Unavailable when called for scheduled AWV. No answer. Phone does not ring but goes straight to voicemail. Left msg. Okay to reschedule.

## 2022-12-07 ENCOUNTER — Ambulatory Visit (INDEPENDENT_AMBULATORY_CARE_PROVIDER_SITE_OTHER): Payer: Medicare Other | Admitting: Family Medicine

## 2022-12-07 ENCOUNTER — Encounter: Payer: Self-pay | Admitting: Physical Therapy

## 2022-12-07 ENCOUNTER — Ambulatory Visit: Payer: Medicare Other | Admitting: Physical Therapy

## 2022-12-07 ENCOUNTER — Encounter: Payer: Self-pay | Admitting: Family Medicine

## 2022-12-07 VITALS — BP 160/64 | HR 82 | Temp 98.3°F | Ht 62.5 in | Wt 137.2 lb

## 2022-12-07 DIAGNOSIS — M6281 Muscle weakness (generalized): Secondary | ICD-10-CM | POA: Diagnosis not present

## 2022-12-07 DIAGNOSIS — M25611 Stiffness of right shoulder, not elsewhere classified: Secondary | ICD-10-CM | POA: Diagnosis not present

## 2022-12-07 DIAGNOSIS — E1169 Type 2 diabetes mellitus with other specified complication: Secondary | ICD-10-CM

## 2022-12-07 DIAGNOSIS — E1159 Type 2 diabetes mellitus with other circulatory complications: Secondary | ICD-10-CM | POA: Diagnosis not present

## 2022-12-07 DIAGNOSIS — G720 Drug-induced myopathy: Secondary | ICD-10-CM | POA: Diagnosis not present

## 2022-12-07 DIAGNOSIS — T466X5A Adverse effect of antihyperlipidemic and antiarteriosclerotic drugs, initial encounter: Secondary | ICD-10-CM

## 2022-12-07 DIAGNOSIS — E785 Hyperlipidemia, unspecified: Secondary | ICD-10-CM

## 2022-12-07 DIAGNOSIS — M25511 Pain in right shoulder: Secondary | ICD-10-CM | POA: Diagnosis not present

## 2022-12-07 DIAGNOSIS — Z0000 Encounter for general adult medical examination without abnormal findings: Secondary | ICD-10-CM | POA: Diagnosis not present

## 2022-12-07 DIAGNOSIS — I152 Hypertension secondary to endocrine disorders: Secondary | ICD-10-CM

## 2022-12-07 DIAGNOSIS — F331 Major depressive disorder, recurrent, moderate: Secondary | ICD-10-CM | POA: Diagnosis not present

## 2022-12-07 DIAGNOSIS — E039 Hypothyroidism, unspecified: Secondary | ICD-10-CM | POA: Diagnosis not present

## 2022-12-07 NOTE — Assessment & Plan Note (Signed)
Stable, chronic.  Continue current medication.  stable control last check on levothyroxine 75 mcg daily

## 2022-12-07 NOTE — Assessment & Plan Note (Signed)
tolerable control on no medication.

## 2022-12-07 NOTE — Progress Notes (Signed)
Patient ID: Renee [name], female    DOB: 1946/06/20, 76 y.o.   MRN: [id number]  This visit was conducted in person.  BP (!) 160/64   Pulse 82   Temp 98.3 F (36.8 C) (Oral)   Ht 5' 2.5" (1.588 m)   Wt 137 lb 4 oz (62.3 kg)   SpO2 97%   BMI 24.70 kg/m    CC:  Chief Complaint  Patient presents with   Annual Exam    Subjective:   HPI: Renee [name] is a 76 y.o. female presenting on 12/07/2022 for Annual Exam  The patient presents for  complete physical and review of chronic health problems. He/She also has the following acute concerns today: none  Hypertension:   Inadequate control.  In office today and at last several checks despite losartan 50 mg daily says she was running around lately. BP Readings from Last 3 Encounters:  12/07/22 (!) 160/64  11/29/22 (!) 156/84  10/21/22 (!) 146/62  Using medication without problems or lightheadedness:  Chest pain with exertion:none Edema:none Short of breath:none Average home BPs: not checking lately 131/70 in Nov Other issues:  Elevated Cholesterol: Chronic,inadequate control.She continues Zetia 10 mg p.o. daily.  She is intolerant of statins given myopathy. Lab Results  Component Value Date   CHOL 185 11/29/2022   HDL 52.00 11/29/2022   LDLCALC 106 (H) 11/29/2022   TRIG 138.0 11/29/2022   CHOLHDL 4 11/29/2022  Using medications without problems: Muscle aches:  Diet compliance: Exercise: PT for  right shoulder post surgery... reviewed recent ORtho OV. Other complaints:  Diabetes:  well controlled on Trulicity 4.65 mg weekly  Lab Results  Component Value Date   HGBA1C 6.7 (H) 11/29/2022  Using medications without difficulties: Hypoglycemic episodes: Hyperglycemic episodes: Feet problems: no ulcer Blood Sugars averaging: 101-113 eye exam within last year: yes  Hypothyroid stable control last check on levothyroxine 75 mcg daily Lab Results  Component Value Date   TSH 2.49 05/31/2022   MDD,GAD: tolerable control  on no medication.     Relevant past medical, surgical, family and social history reviewed and updated as indicated. Interim medical history since our last visit reviewed. Allergies and medications reviewed and updated. Outpatient Medications Prior to Visit  Medication Sig Dispense Refill   acetaminophen (TYLENOL) 650 MG CR tablet Take 1,300 mg by mouth every 8 (eight) hours as needed for pain.     aspirin EC 81 MG tablet Take 81 mg by mouth daily.     Blood Glucose Monitoring Suppl (ONETOUCH VERIO) w/Device KIT by Does not apply route. Use to check fasting blood sugar daily and an occasional 2 hr postprandial     Carboxymethylcellulose Sodium (THERATEARS OP) Place 1 drop into both eyes at bedtime as needed (dry eyes).     clobetasol ointment (TEMOVATE) 0.05 % APPLY TOPICALLY DAILY AS NEEDED FOR PSORIASIS FLARES 60 g 0   Coenzyme Q10 (COQ10) 200 MG CAPS Take 200 mg by mouth daily.     Dulaglutide (TRULICITY) 0.35 WS/5.6CL SOPN Inject 0.75 mg into the skin once a week. Via [employer] PAP     Emollient (CERAVE) CREA Apply 1 application  topically 2 (two) times daily as needed (psoriasis).     ezetimibe (ZETIA) 10 MG tablet Take 1 tablet (10 mg total) by mouth daily. 90 tablet 2   famotidine (PEPCID) 20 MG tablet Take 20 mg by mouth daily as needed for heartburn or indigestion.     Flaxseed, Linseed, (FLAX SEED OIL  PO) Take 1 capsule by mouth daily.     fluticasone (FLONASE) 50 MCG/ACT nasal spray Place 1 spray into both nostrils daily as needed for allergies.     HYDROcodone-acetaminophen (NORCO/VICODIN) 5-325 MG tablet Take 1 tablet by mouth every 4 (four) hours as needed for moderate pain. 20 tablet 0   Lancets (ONETOUCH ULTRASOFT) lancets Use to check fasting blood sugar daily and an occasional 2 hr postprandial 100 each 11   levothyroxine (SYNTHROID) 75 MCG tablet TAKE 1 TABLET BY MOUTH EVERY DAY 90 tablet 3   losartan (COZAAR) 50 MG tablet TAKE 1 TABLET BY MOUTH EVERY DAY 90 tablet 1    MAGNESIUM PO Take 1 tablet by mouth daily.     Multiple Vitamin (MULTIVITAMIN) tablet Take 1 tablet by mouth daily.     nitroGLYCERIN (NITROSTAT) 0.4 MG SL tablet Place 1 tablet (0.4 mg total) under the tongue every 5 (five) minutes as needed. 25 tablet 1   ondansetron (ZOFRAN) 4 MG tablet Take 1 tablet (4 mg total) by mouth every 8 (eight) hours as needed for nausea or vomiting. 10 tablet 0   ONETOUCH VERIO test strip USE TO CHECK FASTING BLOOD SUGAR DAILY AND AN OCCASIONAL 2 HR POSTPRANDIAL. 100 strip 11   Skin Protectants, Misc. (EUCERIN) cream Apply 1 application  topically as needed (eczema).     triamcinolone cream (KENALOG) 0.1 % Apply 1 application topically 2 (two) times daily as needed (psoriasis). 454 g 0   Vitamin D, Cholecalciferol, 25 MCG (1000 UT) CAPS Take 2,000 Units by mouth daily.     tiZANidine (ZANAFLEX) 4 MG tablet Take 1 tablet (4 mg total) by mouth every 8 (eight) hours as needed for muscle spasms. (Patient not taking: Reported on 10/28/2022) 30 tablet 1   No facility-administered medications prior to visit.     Per HPI unless specifically indicated in ROS section below Review of Systems  Constitutional:  Negative for fatigue and fever.  HENT:  Negative for congestion.   Eyes:  Negative for pain.  Respiratory:  Negative for cough and shortness of breath.   Cardiovascular:  Negative for chest pain, palpitations and leg swelling.  Gastrointestinal:  Negative for abdominal pain.  Genitourinary:  Negative for dysuria and vaginal bleeding.  Musculoskeletal:  Negative for back pain.  Neurological:  Negative for syncope, light-headedness and headaches.  Psychiatric/Behavioral:  Negative for dysphoric mood.    Objective:  BP (!) 160/64   Pulse 82   Temp 98.3 F (36.8 C) (Oral)   Ht 5' 2.5" (1.588 m)   Wt 137 lb 4 oz (62.3 kg)   SpO2 97%   BMI 24.70 kg/m   Wt Readings from Last 3 Encounters:  12/07/22 137 lb 4 oz (62.3 kg)  11/29/22 138 lb (62.6 kg)  10/21/22 136  lb 11 oz (62 kg)      Physical Exam Constitutional:      General: She is not in acute distress.    Appearance: Normal appearance. She is well-developed. She is not ill-appearing or toxic-appearing.  HENT:     Head: Normocephalic.     Right Ear: Hearing, tympanic membrane, ear canal and external ear normal. Tympanic membrane is not erythematous, retracted or bulging.     Left Ear: Hearing, tympanic membrane, ear canal and external ear normal. Tympanic membrane is not erythematous, retracted or bulging.     Nose: No mucosal edema or rhinorrhea.     Right Sinus: No maxillary sinus tenderness or frontal sinus tenderness.  Left Sinus: No maxillary sinus tenderness or frontal sinus tenderness.     Mouth/Throat:     Pharynx: Uvula midline.  Eyes:     General: Lids are normal. Lids are everted, no foreign bodies appreciated.     Conjunctiva/sclera: Conjunctivae normal.     Pupils: Pupils are equal, round, and reactive to light.  Neck:     Thyroid: No thyroid mass or thyromegaly.     Vascular: No carotid bruit.     Trachea: Trachea normal.  Cardiovascular:     Rate and Rhythm: Normal rate and regular rhythm.     Pulses: Normal pulses.     Heart sounds: Normal heart sounds, S1 normal and S2 normal. No murmur heard.    No friction rub. No gallop.  Pulmonary:     Effort: Pulmonary effort is normal. No tachypnea or respiratory distress.     Breath sounds: Normal breath sounds. No decreased breath sounds, wheezing, rhonchi or rales.  Abdominal:     General: Bowel sounds are normal.     Palpations: Abdomen is soft.     Tenderness: There is no abdominal tenderness.  Musculoskeletal:     Cervical back: Normal range of motion and neck supple.  Skin:    General: Skin is warm and dry.     Findings: No rash.  Neurological:     Mental Status: She is alert.  Psychiatric:        Mood and Affect: Mood is not anxious or depressed.        Speech: Speech normal.        Behavior: Behavior normal.  Behavior is cooperative.        Thought Content: Thought content normal.        Judgment: Judgment normal.       Results for orders placed or performed in visit on 11/29/22  Comprehensive metabolic panel  Result Value Ref Range   Sodium 139 135 - 145 mEq/L   Potassium 4.5 3.5 - 5.1 mEq/L   Chloride 103 96 - 112 mEq/L   CO2 30 19 - 32 mEq/L   Glucose, Bld 100 (H) 70 - 99 mg/dL   BUN 20 6 - 23 mg/dL   Creatinine, Ser 0.65 0.40 - 1.20 mg/dL   Total Bilirubin 0.5 0.2 - 1.2 mg/dL   Alkaline Phosphatase 58 39 - 117 U/L   AST 18 0 - 37 U/L   ALT 13 0 - 35 U/L   Total Protein 7.4 6.0 - 8.3 g/dL   Albumin 4.5 3.5 - 5.2 g/dL   GFR 84.44 >60.00 mL/min   Calcium 10.0 8.4 - 10.5 mg/dL  Microalbumin / creatinine urine ratio  Result Value Ref Range   Microalb, Ur 0.7 0.0 - 1.9 mg/dL   Creatinine,U 60.6 mg/dL   Microalb Creat Ratio 1.2 0.0 - 30.0 mg/g  Lipid panel  Result Value Ref Range   Cholesterol 185 0 - 200 mg/dL   Triglycerides 138.0 0.0 - 149.0 mg/dL   HDL 52.00 >39.00 mg/dL   VLDL 27.6 0.0 - 40.0 mg/dL   LDL Cholesterol 106 (H) 0 - 99 mg/dL   Total CHOL/HDL Ratio 4    NonHDL 133.19   Hemoglobin A1c  Result Value Ref Range   Hgb A1c MFr Bld 6.7 (H) 4.6 - 6.5 %     COVID 19 screen:  No recent travel or known exposure to COVID19 The patient denies respiratory symptoms of COVID 19 at this time. The importance of social distancing was discussed  today.   Assessment and Plan The patient's preventative maintenance and recommended screening tests for an annual wellness exam were reviewed in full today. Brought up to date unless services declined.  Counselled on the importance of diet, exercise, and its role in overall health and mortality. The patient's FH and SH was reviewed, including their home life, tobacco status, and drug and alcohol status.    Vaccines:  Flu , PNA and COVID x 3 uptodate . Shingles - considering Foot exam - DONE  Colon cancer screening - colonoscopy  04/2017, plan repeat in 5 years per pt request.   Mammo: 12/2021, plan 12/2022 except if shoulder not improved can postpone to 3-03/2023 Bone density - 12/2021 Hep C screening - done  Nonsmoker  Problem List Items Addressed This Visit     Adult hypothyroidism    Stable, chronic.  Continue current medication.  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  No side effects        Hyperlipidemia associated with type 2 diabetes mellitus (HCC)    Chronic, worsening control in the last 3 to 6 months.   She continues Zetia 10 mg p.o. daily.  She is intolerant of statins given myopathy.      Hypertension associated with diabetes (HCC)    Chronic, Inadequate control.  In office today and at last several checks despite losartan 50 mg daily... better control at home but will follow more closely.      MDD (major depressive disorder), recurrent episode, moderate (HCC)    tolerable control on no medication.      Statin myopathy   Other Visit Diagnoses     Routine general medical examination at a health care facility    -  Primary         Eliezer Lofts, MD

## 2022-12-07 NOTE — Patient Instructions (Addendum)
Keep phone visit for [facility] call. Follow blood pressure at home.Marland Kitchen goal < 140/90.  Plan mammogram when shoulder feeling better in  early 2024  Call Dr. Gennette Pac office to get in for follow up on lichen sclerosis.

## 2022-12-07 NOTE — Assessment & Plan Note (Addendum)
Chronic, Inadequate control.  In office today and at last several checks despite losartan 50 mg daily... better control at home but will follow more closely.

## 2022-12-07 NOTE — Therapy (Unsigned)
OUTPATIENT PHYSICAL THERAPY TREATMENT NOTE   Patient Name: Renee [name] MRN: [id number] DOB:09/17/1946, 76 y.o., female Today's Date: 12/07/2022  PCP: Jinny Sanders, MD  REFERRING PROVIDER: Jenetta Loges, PA-C   END OF SESSION:   PT End of Session - 12/07/22 0957     Visit Number 6    Number of Visits 17    Date for PT Re-Evaluation 01/13/23    Authorization Type Medicare 2023    Progress Note Due on Visit 10    PT Start Time 0955    PT Stop Time 1030    PT Time Calculation (min) 35 min    Activity Tolerance No increased pain    Behavior During Therapy WFL for tasks assessed/performed                Past Medical History:  Diagnosis Date   Allergy    Anxiety    Arthritis    CAD (coronary artery disease)    non-obstructive. cath 11/11   Claustrophobia    Diabetes mellitus    borderline diabetic - diet controlled    GERD (gastroesophageal reflux disease)    History of chicken pox    History of phobia    clastrophobia   Hyperlipidemia    Hypertension    Hypothyroidism    Kidney stones    hx of    Lichen sclerosus    Psoriasis    Stroke ([place])    Urinary tract infection    hx of    Past Surgical History:  Procedure Laterality Date   APPENDECTOMY     CARDIAC CATHETERIZATION     2011   CARPAL TUNNEL RELEASE     Left trigger finger   CATARACT EXTRACTION W/PHACO Left 06/06/2018   Procedure: CATARACT EXTRACTION PHACO AND INTRAOCULAR LENS PLACEMENT ([place]);  Surgeon: Birder Robson, MD;  Location: [facility] ORS;  Service: Ophthalmology;  Laterality: Left;  [place]  00:23 AP% 16.0 CDE 3.69 Fluid pack lot # [id number]   CATARACT EXTRACTION W/PHACO Right 09/19/2018   Procedure: CATARACT EXTRACTION PHACO AND INTRAOCULAR LENS PLACEMENT ([facility]);  Surgeon: Birder Robson, MD;  Location: [facility] OR[facility];  Service: Ophthalmology;  Laterality: Right;  [place] 00:29.5 AP% 17.0 CDE$ 5.03 Fluid pack lot # [id number]   COLONOSCOPY WITH PROPOFOL N/A 05/13/2017   Procedure: COLONOSCOPY WITH  PROPOFOL;  Surgeon: Manya Silvas, MD;  Location: [facility] ENDOSCOPY;  Service: Endoscopy;  Laterality: N/A;   ESOPHAGOGASTRODUODENOSCOPY (EGD) WITH PROPOFOL N/A 01/04/2020   Procedure: ESOPHAGOGASTRODUODENOSCOPY (EGD) WITH PROPOFOL;  Surgeon: [name], MD;  Location: [facility] ENDOSCOPY;  Service: Gastroenterology;  Laterality: N/A;   EYE SURGERY  2004   lasik - right eye    JOINT REPLACEMENT     KNEE SURGERY Right 2015   arthroscopy   REVERSE SHOULDER ARTHROPLASTY Right 10/21/2022   Procedure: REVERSE SHOULDER ARTHROPLASTY;  Surgeon: Justice Britain, MD;  Location: [facility] ORS;  Service: Orthopedics;  Laterality: Right;  154mn   TOTAL KNEE ARTHROPLASTY Bilateral 06/12/2015   Procedure: TOTAL KNEE BILATERAL;  Surgeon: FGaynelle Arabian MD;  Location: [facility] ORS;  Service: Orthopedics;  Laterality: Bilateral;  and epidural   TUBAL LIGATION     Tubes tied     and untied. and tied again   Patient Active Problem List   Diagnosis Date Noted   Pain in joint of left shoulder 11/03/2022   Statin myopathy 06/04/2022   Hordeolum externum of left upper eyelid 02/17/2022   Acute gout involving toe of right foot 08/12/2021   Chronic pain  of both shoulders 08/12/2021   History of TIA (transient ischemic attack) 06/26/2021   Statin intolerance 01/15/2021   MDD (major depressive disorder), recurrent episode, moderate (HCC) 07/08/2020   Chronic insomnia 07/08/2020   Chronic neck pain 10/05/2019   Chronic diarrhea 03/30/2018   GAD (generalized anxiety disorder) 01/31/2018   Osteopenia 12/08/2017   Vitamin D deficiency 03/04/2016   Allergic rhinitis 03/04/2016   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)  ([place]) 10/20/2015   External hemorrhoid 10/20/2015   Psoriasis 10/20/2015   History of bilateral knee replacement 10/20/2015   Osteoarthritis of knee 11/25/2014   Hypertension associated with diabetes ([place]) 11/06/2010   CAD, NATIVE VESSEL 11/06/2010   Hyperlipidemia associated with type  2 diabetes mellitus ([place]) 01/25/2006   Adult hypothyroidism 11/15/2005    REFERRING DIAG: Z96.619 (ICD-10-CM) - Presence of unspecified artificial shoulder joint    THERAPY DIAG:  Acute pain of right shoulder  Stiffness of right shoulder, not elsewhere classified  Muscle weakness (generalized)  Rationale for Evaluation and Treatment Rehabilitation  PERTINENT HISTORY: Pt is 76 year old female s/p right reverse TSA 10/21/22. Patient reports performing home exercises given by [facility] including pendulums, elbow AROM, wrist AROM, and sliding hand on her lap. Patient reports more pain rTSA compared to bilat TKA. Pt has been released from using her sling; pt uses only for going out in public and walking outside. Pt denies significant post-operative complications.    Pain:  Pain Intensity: Present: 3-4/10, Best: 1-2/10, Worst: 8-9/10 Pain location: Lateral arm and down to R elbow Pain Quality: aching  Radiating: Yes , down to R elbow Numbness/Tingling: Yes, NT down to her R fingers, affecting all digits  Aggravating factors: trying to lie down, raising arm inadvertently Relieving factors: avoiding unnecessary R shoulder activity  History of prior shoulder or neck/shoulder injury, pain, surgery, or therapy: Yes, PT prior to rTSA  Falls: Has patient fallen in last 6 months? No, Number of falls: N/A Dominant hand: left Imaging: Yes , X-rays post-operatively demonstrated good healing, no hardware issues   Prior level of function: Independent Occupational demands: N/A Hobbies:  walking exercise regimen Red flags (personal history of cancer, chills/fever, night sweats, nausea, vomiting, unrelenting pain): Negative   Precautions: rTSA post-op precautions   Weight Bearing Restrictions: No   Living Environment Lives with: lives alone, children live in [place] close to her; 4 daughters, 2 sons Lives in: House/apartment     Patient Goals: Return to walking exercise regimen, able to  travel/drive as needed    PRECAUTIONS: ER to 45, Abduction to 90, Flexion to 115 deg (weeks 4-8)  Right Reverse TSA, DOS: 10/21/22   SUBJECTIVE:  SUBJECTIVE STATEMENT:  Patient reports feeling alright at arrival to PT. Patient reports noctural pain is better; "I had a couple of nights it didn't hurt too bad." She reported some aching in R elbow this AM that she attributes to arthritis.   PAIN:  Are you having pain? Yes, "barely" having pain at arrival    OBJECTIVE: (objective measures completed at initial evaluation unless otherwise dated)   Patient Surveys  FOTO: 47, predicted outcome score of 72   Cognition Patient is oriented to person, place, and time.  Recent memory is intact.  Remote memory is intact.  Attention span and concentration are intact.  Expressive speech is intact.  Patient's fund of knowledge is within normal limits for educational level.                          Gross Musculoskeletal Assessment Tremor: None Bulk: Normal Tone: Normal     Posture Forward head, rounded shoulders, inc thoracic kyphosis       AROM       AROM (Normal range in degrees) AROM 11/17/2022    Right Left  Shoulder      Flexion Deferred Deferred  Extension Deferred Deferred  Abduction Deferred Deferred  External Rotation Deferred Deferred  Internal Rotation Deferred Deferred  Hands Behind Head Deferred Deferred  Hands Behind Back Deferred Deferred         Elbow      Flexion WNL WNL  Extension -15 WNL  Pronation WNL WNL  Supination WNL WNL  (* = pain; Blank rows = not tested)     Shoulder PROM Flexion: R 90 deg Abduction: R 40 deg ER: R 30 deg     LE MMT: MMTs deferred due to post-operative status* MMT (out of 5) Right 11/17/2022 Left 11/17/2022         Shoulder   Flexion       Extension      Abduction      External rotation      Internal rotation      Horizontal abduction      Horizontal adduction      Lower Trapezius      Rhomboids             Elbow  Flexion      Extension      Pronation      Supination             Wrist  Flexion      Extension      Radial deviation      Ulnar deviation             MCP  Flexion      Extension      Abduction      Adduction      (* = pain; Blank rows = not tested)   Sensation Grossly intact to light touch bilateral UE as determined by testing dermatomes C2-T2. Proprioception and hot/cold testing deferred on this date.     Palpation   Location LEFT  RIGHT           Subocciptials      Cervical paraspinals      Upper Trapezius   1  Levator Scapulae      Rhomboid Major/Minor      Sternoclavicular joint      Acromioclavicular joint      Coracoid process      Long head of biceps  Supraspinatus      Infraspinatus      Subscapularis      Teres Minor      Teres Major      Pectoralis Major   0  Pectoralis Minor   0  Anterior Deltoid   1  Lateral Deltoid   1  Posterior Deltoid   1  Latissimus Dorsi      Sternocleidomastoid      (Blank rows = not tested) Graded on 0-4 scale (0 = no pain, 1 = pain, 2 = pain with wincing/grimacing/flinching, 3 = pain with withdrawal, 4 = unwilling to allow palpation), (Blank rows = not tested)     Accessory Motions/Glides Deferred       TODAY'S TREATMENT      Manual Therapy - for symptom modulation, soft tissue sensitivity and mobility, shoulder ROM   R shoulder PROM flexion, ER, IR hand to abdomen, and abd up to limits of protocol; x 3 minutes  *not today* STM R middle and anterior deltoid, biceps; x 2 minutes   Therapeutic Exercise - for shoulder complex ROM as needed for functional reaching and household chores;    Shoulder flexion AROM with lawn chair progression; bed raised to 30 deg, then 45 deg; 1x10 in each position   Sidelying shoulder  abduction to 90 deg; L sidelying; 2x10    Wall wash for forward flexion; 1x6 and 1x8    *next visit*  Standing Tband row; Red Tband; x20  -heavy verbal cueing and demonstration to avoid shoulder extension beyond neutral     Shoulder isometrics, flexion, abduction and ER; x15, 5 sec hold  -moderate verbal cueing and demonstration for technique and limited force with submaximal isometrics   *not today* Finger ladder, forward elevation; x10 Supine; Wand flexion AAROM; 1x10 - verbal cueing for safe ROM, forward elevation to 115 Supine; Wand ER AAROM; to 45 deg only; 2x10  Shoulder ER with elbow on tabletop, in sitting; x20 Pulley, forward elevation; x 2 minutes         PATIENT EDUCATION:  Education details: Plan of care, HEP Person educated: Patient Education method: Explanation Education comprehension: verbalized understanding     HOME EXERCISE PROGRAM: Exercises - Supine Shoulder Flexion AAROM with dowel (up to 90 deg only); 2x10, 2x/day -Gripping, theraputty; 2-3 min; 2x/day -Pt continuing HEP given from referring clinic (elbow AROM, pendulums, wrist and digit AROM)       ASSESSMENT:   CLINICAL IMPRESSION: Patient is able to attain AAROM up to limits of protocol. Pt demonstrates active shoulder flexion in standing to 75 deg, active abduction in standing to 80 deg. Continued work on AROM today within limits of post-operative protocol to ensure protection of rTSA components. Pt is making fair progress in with phase of rehab, but she does need work on shoulder elevation AROM. Patient will benefit from continued skilled therapeutic intervention to address the above deficits as needed for improved function and QoL.    REHAB POTENTIAL: Good   CLINICAL DECISION MAKING: Stable/uncomplicated   EVALUATION COMPLEXITY: Low     GOALS: Goals reviewed with patient? Yes   SHORT TERM GOALS: Target date: 12/15/2022   Pt will be independent with HEP to improve strength and  decrease neck pain to improve pain-free function at home and work. Baseline: 11/16/22: Baseline HEP initiated, HEP previously given from referring office reviewed.   Goal status: INITIAL     LONG TERM GOALS: Target date: 01/12/2023   Pt will increase FOTO to at least 53  to demonstrate significant improvement in function at home and work related to neck pain  Baseline: 11/16/22: FOTO 29.   Goal status: INITIAL   2.  Pt will decrease worst shoulder pain by at least 3 points on the NPRS in order to demonstrate clinically significant reduction in shoulder pain. Baseline: 11/16/22: 8-9/10 at worst Goal status: INITIAL   3.   Patient will have shoulder flexion AROM to at least 130 deg as needed for functional reaching, self-care activities, household chores Baseline: 11/16/22: R shoulder flexion AROM not tested due to post-op status, flexion PROM to 90 deg (up to current restriction).    Goal status: INITIAL   4. Pt will increase strength of tested shoulder musculature to at least 4/5 or greater MMT grade in order to demonstrate improvement in strength and function as needed for light to moderate lifting and household chores          Baseline: 11/16/22: MMT 2-/5 globally given decreased shoulder ROM.  Goal status: INITIAL     PLAN: PT FREQUENCY: 2x/week   PT DURATION: 8 weeks   PLANNED INTERVENTIONS: Therapeutic exercises, Therapeutic activity, Neuromuscular re-education, Balance training, Gait training, Patient/Family education, Joint manipulation, Joint mobilization, Dry Needling, Electrical stimulation, Cryotherapy, Moist heat, Traction, Ultrasound, Ionotophoresis '4mg'$ /ml Dexamethasone, and Manual therapy   PLAN FOR NEXT SESSION: Gentle R shoulder PROM within pt tolerance and up to limits of restrictions per protocol. Continue with AAROM and AROM as tolerated within post-operative guidelines. Continue with isometrics and gentle periscapular isotonics without R shoulder hyperextension.      Valentina Gu, PT, DPT #[id number]  Eilleen Kempf, PT 12/07/2022, 9:57 AM

## 2022-12-07 NOTE — Assessment & Plan Note (Signed)
Chronic, worsening control in the last 3 to 6 months.   She continues Zetia 10 mg p.o. daily.  She is intolerant of statins given myopathy.

## 2022-12-07 NOTE — Assessment & Plan Note (Signed)
Chronic, associated with hypertension Well-controlled on Trulicity 1.94 mg weekly.  No side effects

## 2022-12-09 ENCOUNTER — Encounter: Payer: Self-pay | Admitting: Physical Therapy

## 2022-12-09 ENCOUNTER — Ambulatory Visit: Payer: Medicare Other | Admitting: Physical Therapy

## 2022-12-09 DIAGNOSIS — M25611 Stiffness of right shoulder, not elsewhere classified: Secondary | ICD-10-CM

## 2022-12-09 DIAGNOSIS — M25511 Pain in right shoulder: Secondary | ICD-10-CM | POA: Diagnosis not present

## 2022-12-09 DIAGNOSIS — M6281 Muscle weakness (generalized): Secondary | ICD-10-CM

## 2022-12-09 NOTE — Therapy (Signed)
OUTPATIENT PHYSICAL THERAPY TREATMENT NOTE   Patient Name: Renee [name] MRN: [id number] DOB:Jun 13, 1946, 76 y.o., female Today's Date: 12/09/2022  PCP: Jinny Sanders, MD  REFERRING PROVIDER: Jenetta Loges, PA-C   END OF SESSION:   PT End of Session - 12/09/22 1512     Visit Number 7    Number of Visits 17    Date for PT Re-Evaluation 01/13/23    Authorization Type Medicare 2023    Progress Note Due on Visit 10    PT Start Time 1432    PT Stop Time 1515    PT Time Calculation (min) 43 min    Activity Tolerance No increased pain    Behavior During Therapy WFL for tasks assessed/performed             Past Medical History:  Diagnosis Date   Allergy    Anxiety    Arthritis    CAD (coronary artery disease)    non-obstructive. cath 11/11   Claustrophobia    Diabetes mellitus    borderline diabetic - diet controlled    GERD (gastroesophageal reflux disease)    History of chicken pox    History of phobia    clastrophobia   Hyperlipidemia    Hypertension    Hypothyroidism    Kidney stones    hx of    Lichen sclerosus    Psoriasis    Stroke ([place])    Urinary tract infection    hx of    Past Surgical History:  Procedure Laterality Date   APPENDECTOMY     CARDIAC CATHETERIZATION     2011   CARPAL TUNNEL RELEASE     Left trigger finger   CATARACT EXTRACTION W/PHACO Left 06/06/2018   Procedure: CATARACT EXTRACTION PHACO AND INTRAOCULAR LENS PLACEMENT ([place]);  Surgeon: Birder Robson, MD;  Location: [facility] ORS;  Service: Ophthalmology;  Laterality: Left;  [place]  00:23 AP% 16.0 CDE 3.69 Fluid pack lot # [id number]   CATARACT EXTRACTION W/PHACO Right 09/19/2018   Procedure: CATARACT EXTRACTION PHACO AND INTRAOCULAR LENS PLACEMENT ([facility]);  Surgeon: Birder Robson, MD;  Location: [facility];  Service: Ophthalmology;  Laterality: Right;  [place] 00:29.5 AP% 17.0 CDE$ 5.03 Fluid pack lot # [id number]   COLONOSCOPY WITH PROPOFOL N/A 05/13/2017   Procedure: COLONOSCOPY WITH  PROPOFOL;  Surgeon: Manya Silvas, MD;  Location[facility] ENDOSCOPY;  Service: Endoscopy;  Laterality: N/A;   ESOPHAGOGASTRODUODENOSCOPY (EGD) WITH PROPOFOL N/A 01/04/2020   Procedure: ESOPHAGOGASTRODUODENOSCOPY (EGD) WITH PROPOFOL;  Surgeon: [name], MD;  Location: [facility] ENDOSCOPY;  Service: Gastroenterology;  Laterality: N/A;   EYE SURGERY  2004   lasik - right eye    JOINT REPLACEMENT     KNEE SURGERY Right 2015   arthroscopy   REVERSE SHOULDER ARTHROPLASTY Right 10/21/2022   Procedure: REVERSE SHOULDER ARTHROPLASTY;  Surgeon: Justice Britain, MD;  Location: [facility]S;  Service: Orthopedics;  Laterality: Right;  137mn   TOTAL KNEE ARTHROPLASTY Bilateral 06/12/2015   Procedure: TOTAL KNEE BILATERAL;  Surgeon: FGaynelle Arabian MD;  Location: [facility];  Service: Orthopedics;  Laterality: Bilateral;  and epidural   TUBAL LIGATION     Tubes tied     and untied. and tied again   Patient Active Problem List   Diagnosis Date Noted   Pain in joint of left shoulder 11/03/2022   Statin myopathy 06/04/2022   Hordeolum externum of left upper eyelid 02/17/2022   Acute gout involving toe of right foot 08/12/2021   Chronic pain of both shoulders  08/12/2021   History of TIA (transient ischemic attack) 06/26/2021   Statin intolerance 01/15/2021   MDD (major depressive disorder), recurrent episode, moderate (HCC) 07/08/2020   Chronic insomnia 07/08/2020   Chronic neck pain 10/05/2019   Chronic diarrhea 03/30/2018   GAD (generalized anxiety disorder) 01/31/2018   Osteopenia 12/08/2017   Vitamin D deficiency 03/04/2016   Allergic rhinitis 03/04/2016   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)  ([place]) 10/20/2015   External hemorrhoid 10/20/2015   Psoriasis 10/20/2015   History of bilateral knee replacement 10/20/2015   Osteoarthritis of knee 11/25/2014   Hypertension associated with diabetes ([place]) 11/06/2010   CAD, NATIVE VESSEL 11/06/2010   Hyperlipidemia associated with type  2 diabetes mellitus ([place]) 01/25/2006   Adult hypothyroidism 11/15/2005    REFERRING DIAG: Z96.619 (ICD-10-CM) - Presence of unspecified artificial shoulder joint    THERAPY DIAG:  Acute pain of right shoulder  Stiffness of right shoulder, not elsewhere classified  Muscle weakness (generalized)  Rationale for Evaluation and Treatment Rehabilitation  PERTINENT HISTORY: Pt is 76 year old female s/p right reverse TSA 10/21/22. Patient reports performing home exercises given by [facility] health including pendulums, elbow AROM, wrist AROM, and sliding hand on her lap. Patient reports more pain rTSA compared to bilat TKA. Pt has been released from using her sling; pt uses only for going out in public and walking outside. Pt denies significant post-operative complications.    Pain:  Pain Intensity: Present: 3-4/10, Best: 1-2/10, Worst: 8-9/10 Pain location: Lateral arm and down to R elbow Pain Quality: aching  Radiating: Yes , down to R elbow Numbness/Tingling: Yes, NT down to her R fingers, affecting all digits  Aggravating factors: trying to lie down, raising arm inadvertently Relieving factors: avoiding unnecessary R shoulder activity  History of prior shoulder or neck/shoulder injury, pain, surgery, or therapy: Yes, PT prior to rTSA  Falls: Has patient fallen in last 6 months? No, Number of falls: N/A Dominant hand: left Imaging: Yes , X-rays post-operatively demonstrated good healing, no hardware issues   Prior level of function: Independent Occupational demands: N/A Hobbies:  walking exercise regimen Red flags (personal history of cancer, chills/fever, night sweats, nausea, vomiting, unrelenting pain): Negative   Precautions: rTSA post-op precautions   Weight Bearing Restrictions: No   Living Environment Lives with: lives alone, children live in [place] close to her; 4 daughters, 2 sons Lives in: House/apartment     Patient Goals: Return to walking exercise regimen, able to  travel/drive as needed    PRECAUTIONS: ER to 45, Abduction to 90, Flexion to 115 deg (weeks 4-8)  Right Reverse TSA, DOS: 10/21/22    SUBJECTIVE:  SUBJECTIVE STATEMENT:  Patient reports feeling well at arrival. She reports not having significant pain the previous night. Pt reports no new complaints at arrival.   PAIN:  Are you having pain? No   OBJECTIVE: (objective measures completed at initial evaluation unless otherwise dated)   Patient Surveys  FOTO: 71, predicted outcome score of 36   Cognition Patient is oriented to person, place, and time.  Recent memory is intact.  Remote memory is intact.  Attention span and concentration are intact.  Expressive speech is intact.  Patient's fund of knowledge is within normal limits for educational level.                          Gross Musculoskeletal Assessment Tremor: None Bulk: Normal Tone: Normal     Posture Forward head, rounded shoulders, inc thoracic kyphosis       AROM       AROM (Normal range in degrees) AROM 11/17/2022    Right Left  Shoulder      Flexion Deferred Deferred  Extension Deferred Deferred  Abduction Deferred Deferred  External Rotation Deferred Deferred  Internal Rotation Deferred Deferred  Hands Behind Head Deferred Deferred  Hands Behind Back Deferred Deferred         Elbow      Flexion WNL WNL  Extension -15 WNL  Pronation WNL WNL  Supination WNL WNL  (* = pain; Blank rows = not tested)     Shoulder PROM Flexion: R 90 deg Abduction: R 40 deg ER: R 30 deg     LE MMT: MMTs deferred due to post-operative status* MMT (out of 5) Right 11/17/2022 Left 11/17/2022         Shoulder   Flexion      Extension      Abduction      External rotation      Internal rotation      Horizontal abduction       Horizontal adduction      Lower Trapezius      Rhomboids             Elbow  Flexion      Extension      Pronation      Supination             Wrist  Flexion      Extension      Radial deviation      Ulnar deviation             MCP  Flexion      Extension      Abduction      Adduction      (* = pain; Blank rows = not tested)   Sensation Grossly intact to light touch bilateral UE as determined by testing dermatomes C2-T2. Proprioception and hot/cold testing deferred on this date.     Palpation   Location LEFT  RIGHT           Subocciptials      Cervical paraspinals      Upper Trapezius   1  Levator Scapulae      Rhomboid Major/Minor      Sternoclavicular joint      Acromioclavicular joint      Coracoid process      Long head of biceps      Supraspinatus      Infraspinatus      Subscapularis      Teres Minor  Teres Major      Pectoralis Major   0  Pectoralis Minor   0  Anterior Deltoid   1  Lateral Deltoid   1  Posterior Deltoid   1  Latissimus Dorsi      Sternocleidomastoid      (Blank rows = not tested) Graded on 0-4 scale (0 = no pain, 1 = pain, 2 = pain with wincing/grimacing/flinching, 3 = pain with withdrawal, 4 = unwilling to allow palpation), (Blank rows = not tested)     Accessory Motions/Glides Deferred       TODAY'S TREATMENT      Manual Therapy - for symptom modulation, soft tissue sensitivity and mobility, shoulder ROM   R shoulder PROM flexion, ER, IR hand to abdomen, and abd up to limits of protocol; x 10 minutes  *not today* STM R middle and anterior deltoid, biceps; x 2 minutes   Therapeutic Exercise - for shoulder complex ROM as needed for functional reaching and household chores;    Shoulder flexion AROM with lawn chair progression; bed raised to 30 deg, then 45 deg; 1x10 in each position   Sidelying shoulder abduction to 90 deg; L sidelying; 1x10    Standing shoulder abduction to 90 deg AROM; 2x10   Wall wash for  forward flexion; 1x6 and 1x8   Shoulder isometrics, flexion, abduction and ER; x15, 5 sec hold  -moderate verbal cueing and demonstration for technique and limited force with submaximal isometrics     *next visit*  Standing Tband row; Red Tband; x20  -heavy verbal cueing and demonstration to avoid shoulder extension beyond neutral       *not today* Finger ladder, forward elevation; x10 Supine; Wand flexion AAROM; 1x10 - verbal cueing for safe ROM, forward elevation to 115 Supine; Wand ER AAROM; to 45 deg only; 2x10  Shoulder ER with elbow on tabletop, in sitting; x20 Pulley, forward elevation; x 2 minutes         PATIENT EDUCATION:  Education details: Plan of care, HEP Person educated: Patient Education method: Explanation Education comprehension: verbalized understanding     HOME EXERCISE PROGRAM: Exercises - Supine Shoulder Flexion AAROM with dowel (up to 90 deg only); 2x10, 2x/day -Gripping, theraputty; 2-3 min; 2x/day -Pt continuing HEP given from referring clinic (elbow AROM, pendulums, wrist and digit AROM)       ASSESSMENT:   CLINICAL IMPRESSION: Due to late arrival, today's session is shorter than normal. Today's PT focused on R shoulder AROM with utilization of lawn chair progression to improve patient's ability to elevate her shoulder in more upright position with anticipated future progression to shoulder flexion AROM in fully upright position. Pt has AAROM and PROM up to current limits of protocol; she needs further work on restoration of AROM. Patient will benefit from continued skilled therapeutic intervention to address the above deficits as needed for improved function and QoL.    REHAB POTENTIAL: Good   CLINICAL DECISION MAKING: Stable/uncomplicated   EVALUATION COMPLEXITY: Low     GOALS: Goals reviewed with patient? Yes   SHORT TERM GOALS: Target date: 12/15/2022   Pt will be independent with HEP to improve strength and decrease neck pain to  improve pain-free function at home and work. Baseline: 11/16/22: Baseline HEP initiated, HEP previously given from referring office reviewed.   Goal status: INITIAL     LONG TERM GOALS: Target date: 01/12/2023   Pt will increase FOTO to at least 53 to demonstrate significant improvement in function at home and  work related to neck pain  Baseline: 11/16/22: FOTO 29.   Goal status: INITIAL   2.  Pt will decrease worst shoulder pain by at least 3 points on the NPRS in order to demonstrate clinically significant reduction in shoulder pain. Baseline: 11/16/22: 8-9/10 at worst Goal status: INITIAL   3.   Patient will have shoulder flexion AROM to at least 130 deg as needed for functional reaching, self-care activities, household chores Baseline: 11/16/22: R shoulder flexion AROM not tested due to post-op status, flexion PROM to 90 deg (up to current restriction).    Goal status: INITIAL   4. Pt will increase strength of tested shoulder musculature to at least 4/5 or greater MMT grade in order to demonstrate improvement in strength and function as needed for light to moderate lifting and household chores          Baseline: 11/16/22: MMT 2-/5 globally given decreased shoulder ROM.  Goal status: INITIAL     PLAN: PT FREQUENCY: 2x/week   PT DURATION: 8 weeks   PLANNED INTERVENTIONS: Therapeutic exercises, Therapeutic activity, Neuromuscular re-education, Balance training, Gait training, Patient/Family education, Joint manipulation, Joint mobilization, Dry Needling, Electrical stimulation, Cryotherapy, Moist heat, Traction, Ultrasound, Ionotophoresis '4mg'$ /ml Dexamethasone, and Manual therapy   PLAN FOR NEXT SESSION: Gentle R shoulder PROM within pt tolerance and up to limits of restrictions per protocol. Continue with AAROM and AROM as tolerated within post-operative guidelines. Continue with isometrics and gentle periscapular isotonics without R shoulder hyperextension.     THIS NOTE IS  INCOMPLETE, PLEASE DO NOT REFERENCE FOR INFORMATION  Valentina Gu, PT, DPT #[id number]  Eilleen Kempf, PT 12/09/2022, 3:13 PM

## 2022-12-13 ENCOUNTER — Ambulatory Visit (INDEPENDENT_AMBULATORY_CARE_PROVIDER_SITE_OTHER): Payer: Medicare Other

## 2022-12-13 VITALS — BP 136/65 | HR 87 | Ht 62.5 in | Wt 137.0 lb

## 2022-12-13 DIAGNOSIS — Z0000 Encounter for general adult medical examination without abnormal findings: Secondary | ICD-10-CM

## 2022-12-13 NOTE — Progress Notes (Unsigned)
Cardiology Office Note  Date:  12/14/2022   ID:  Renee, [name] 1946-10-30, MRN [id number]  PCP:  Renee Sanders, MD   Chief Complaint  Patient presents with   1 year follow up     "Doing well." Medications reviewed by the patient verbally.     HPI:  Renee [name] is a very pleasant 76 year old woman with a history of  hypertension,  hyperlipidemia,  long history of diabetes.  Cardiac catheterization October 2011 for chest pain showed mild to moderate non-obstructive CAD. 2010  with  EF of 55% to 60%.   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 October 2023 Doing PT, doing well  BP at home 130s/60-70 In general reports feeling relatively well, denies chest pain concerning for angina No regular exercise program  Denies any TIA or stroke symptoms No significant lower extremity edema  Lab work reviewed A1c 6.7 Total cholesterol 185 LDL 106 On Zetia alone  EKG personally reviewed by myself on todays visit Normal sinus rhythm rate 83 bpm no significant ST-T wave changes  Other past medical history reviewed In the hospital 05/2021: Possible stroke MRI: no CVA Neck CT: mild carotid disease Etiology unclear  Previously took her self off gemfibrozil and Zetia, was having muscle aches Cholesterol jumped up greater than 200  put her self back on Zetia, no side effects  Previously reported muscle hurting in arms, had cortisone , completed PT Followed by ortho and rheumatology  Elevated blood pressure today Blood pressure elevated on prior clinic visit Reports it is better controlled at home but did not bring any numbers with her Reports sometimes in the 1  teens to 130s May have whitecoat  Statin intolerance Previous significant reaction to statins, simvastatin may have caused a rash.     PMH:   has a past medical history of Allergy, Anxiety, Arthritis, CAD (coronary artery disease), Claustrophobia, Diabetes mellitus, GERD (gastroesophageal reflux disease), History of chicken pox, History of phobia, Hyperlipidemia, Hypertension, Hypothyroidism, Kidney stones, Lichen sclerosus, Psoriasis, Stroke (Renee [name]), and Urinary tract infection.  PSH:    Past Surgical History:  Procedure Laterality Date   APPENDECTOMY     CARDIAC CATHETERIZATION     2011   CARPAL TUNNEL RELEASE     Left trigger finger   CATARACT EXTRACTION W/PHACO Left 06/06/2018   Procedure: CATARACT EXTRACTION PHACO AND INTRAOCULAR LENS PLACEMENT ([place]);  Surgeon: Birder Robson, MD;  Location: [facility] ORS;  Service: Ophthalmology;  Laterality: Left;  [place]  00:23 AP% 16.0 CDE 3.69 Fluid pack lot # [id number]   CATARACT EXTRACTION W/PHACO Right 09/19/2018   Procedure: CATARACT EXTRACTION PHACO AND INTRAOCULAR LENS PLACEMENT ([facility]);  Surgeon: Birder Robson, MD;  Location: [facility] ORS;  Service: Ophthalmology;  Laterality: Right;  [place] 00:29.5 AP% 17.0 CDE$ 5.03 Fluid pack lot # [id number]   COLONOSCOPY WITH PROPOFOL N/A 05/13/2017   Procedure: COLONOSCOPY WITH PROPOFOL;  Surgeon: Manya Silvas, MD;  Location: [facility] ENDOSCOPY;  Service: Endoscopy;  Laterality: N/A;   ESOPHAGOGASTRODUODENOSCOPY (EGD) WITH PROPOFOL N/A 01/04/2020   Procedure: ESOPHAGOGASTRODUODENOSCOPY (EGD) WITH PROPOFOL;  Surgeon: [name], MD;  Location: [facility] ENDOSCOPY;  Service: Gastroenterology;  Laterality: N/A;   EYE SURGERY  2004   lasik - right  eye    JOINT REPLACEMENT     KNEE SURGERY Right 2015   arthroscopy   REVERSE SHOULDER ARTHROPLASTY Right 10/21/2022   Procedure: REVERSE SHOULDER ARTHROPLASTY;  Surgeon: Justice Britain, MD;  Location: [facility];  Service: Orthopedics;  Laterality: Right;  158mn   TOTAL KNEE  ARTHROPLASTY Bilateral 06/12/2015   Procedure: TOTAL KNEE BILATERAL;  Surgeon: FGaynelle Arabian MD;  Location: [facility];  Service: Orthopedics;  Laterality: Bilateral;  and epidural   TUBAL LIGATION     Tubes tied     and untied. and tied again    Current Outpatient Medications  Medication Sig Dispense Refill   acetaminophen (TYLENOL) 650 MG CR tablet Take 1,300 mg by mouth every 8 (eight) hours as needed for pain.     aspirin EC 81 MG tablet Take 81 mg by mouth daily.     Blood Glucose Monitoring Suppl (ONETOUCH VERIO) w/Device KIT by Does not apply route. Use to check fasting blood sugar daily and an occasional 2 hr postprandial     Carboxymethylcellulose Sodium (THERATEARS OP) Place 1 drop into both eyes at bedtime as needed (dry eyes).     clobetasol ointment (TEMOVATE) 0.05 % APPLY TOPICALLY DAILY AS NEEDED FOR PSORIASIS FLARES 60 g 0   Coenzyme Q10 (COQ10) 200 MG CAPS Take 200 mg by mouth daily.     Dulaglutide (TRULICITY) 08.88MKC/0.0LKSOPN Inject 0.75 mg into the skin once a week. Via [employer] Cares PAP     Emollient (CERAVE) CREA Apply 1 application  topically 2 (two) times daily as needed (psoriasis).     ezetimibe (ZETIA) 10 MG tablet Take 1 tablet (10 mg total) by mouth daily. 90 tablet 2   famotidine (PEPCID) 20 MG tablet Take 20 mg by mouth daily as needed for heartburn or indigestion.     Flaxseed, Linseed, (FLAX SEED OIL PO) Take 1 capsule by mouth daily.     fluticasone (FLONASE) 50 MCG/ACT nasal spray Place 1 spray into both nostrils daily as needed for allergies.     HYDROcodone-acetaminophen (NORCO/VICODIN) 5-325 MG tablet Take 1 tablet by mouth every 4 (four) hours as needed for moderate pain. 20 tablet 0   Lancets (ONETOUCH ULTRASOFT) lancets Use to check fasting blood sugar daily and an occasional 2 hr postprandial 100 each 11   levothyroxine (SYNTHROID) 75 MCG tablet TAKE 1 TABLET BY MOUTH EVERY DAY 90 tablet 3   losartan (COZAAR) 50 MG tablet TAKE 1 TABLET BY MOUTH EVERY DAY  90 tablet 1   MAGNESIUM PO Take 1 tablet by mouth daily.     meloxicam (MOBIC) 7.5 MG tablet Take 1 tablet by mouth daily as needed.     Multiple Vitamin (MULTIVITAMIN) tablet Take 1 tablet by mouth daily.     nitroGLYCERIN (NITROSTAT) 0.4 MG SL tablet Place 1 tablet (0.4 mg total) under the tongue every 5 (five) minutes as needed. 25 tablet 1   ondansetron (ZOFRAN) 4 MG tablet Take 1 tablet (4 mg total) by mouth every 8 (eight) hours as needed for nausea or vomiting. 10 tablet 0   ONETOUCH VERIO test strip USE TO CHECK FASTING BLOOD SUGAR DAILY AND AN OCCASIONAL 2 HR POSTPRANDIAL. 100 strip 11   Skin Protectants, Misc. (EUCERIN) cream Apply 1 application  topically as needed (eczema).     triamcinolone cream (KENALOG) 0.1 % Apply 1 application topically 2 (two) times daily as needed (psoriasis). 454 g 0   Vitamin D, Cholecalciferol, 25 MCG (1000 UT) CAPS Take 2,000 Units  by mouth daily.     No current facility-administered medications for this visit.    Allergies:   Celexa [citalopram], Other, Codeine, Metformin, Robaxin [methocarbamol], Tizanidine, Crestor [rosuvastatin], Nickel, and Simvastatin   Social History:  The patient  reports that she has never smoked. She has never used smokeless tobacco. She reports current alcohol use. She reports that she does not use drugs.   Family History:   family history includes Arthritis in her brother and brother; Breast cancer (age of onset: 35) in her daughter; COPD in her mother; Diabetes in her brother, brother, mother, and sister; Heart disease in her brother and brother; Hypertension in her father, mother, and sister; Stomach cancer in her maternal grandfather; Stroke in her maternal grandfather.   Review of Systems: Review of Systems  Constitutional: Negative.   Respiratory: Negative.    Cardiovascular: Negative.   Gastrointestinal: Negative.   Musculoskeletal: Negative.   Neurological: Negative.   Psychiatric/Behavioral: Negative.    All  other systems reviewed and are negative.   PHYSICAL EXAM: VS:  BP (!) 140/60 (BP Location: Left Arm, Patient Position: Sitting, Cuff Size: Normal)   Pulse 83   Ht _0  (1.575 m)   Wt 138 lb (62.6 kg)   SpO2 97%   BMI 25.24 kg/m  , BMI Body mass index is 25.24 kg/m. Constitutional:  oriented to person, place, and time. No distress.  HENT:  Head: Grossly normal Eyes:  no discharge. No scleral icterus.  Neck: No JVD, no carotid bruits  Cardiovascular: Regular rate and rhythm, no murmurs appreciated Pulmonary/Chest: Clear to auscultation bilaterally, no wheezes or rails Abdominal: Soft.  no distension.  no tenderness.  Musculoskeletal: Normal range of motion Neurological:  normal muscle tone. Coordination normal. No atrophy Skin: Skin warm and dry Psychiatric: normal affect, pleasant  Recent Labs: 05/31/2022: TSH 2.49 10/12/2022: Hemoglobin 12.3; Platelets 348 11/29/2022: ALT 13; BUN 20; Creatinine, Ser 0.65; Potassium 4.5; Sodium 139   Lipid Panel Lab Results  Component Value Date   CHOL 185 11/29/2022   HDL 52.00 11/29/2022   LDLCALC 106 (H) 11/29/2022   TRIG 138.0 11/29/2022      Wt Readings from Last 3 Encounters:  12/14/22 138 lb (62.6 kg)  12/13/22 137 lb (62.1 kg)  12/07/22 137 lb 4 oz (62.3 kg)     ASSESSMENT AND PLAN:  Essential hypertension -  Blood pressure is well controlled on today's visit. No changes made to the medications.  Atherosclerosis of native coronary artery of native heart without angina pectoris - Plan: EKG 12-Lead Denies anginal symptoms, cholesterol above goal We discussed PCSK9 inhibitor at length, not interested in sending in a prescription at this time, would like to think about it and talk with Dr. Diona Browner  Hypercholesteremia Statin intolerance, Reports compliance with her Zetia LDL not at goal, numbers discussed with her discussed PCSK9 inhibitor, we have shown her the demo pen, she would like to think about it  Type 2 diabetes  mellitus with complication, unspecified long term insulin use status ([place]) A1c relatively well-controlled 6.6   Total encounter time more than 30 minutes  Greater than 50% was spent in counseling and coordination of care with the patient   No orders of the defined types were placed in this encounter.     Signed, Esmond Plants, M.D., Ph.D. 12/14/2022  [place], [place]

## 2022-12-13 NOTE — Patient Instructions (Addendum)
Renee [name] , Thank you for taking time to come for your Medicare Wellness Visit. I appreciate your ongoing commitment to your health goals. Please review the following plan we discussed and let me know if I can assist you in the future.   These are the goals we discussed:  Goals Addressed             This Visit's Progress    Patient Stated   On track    Would like to continue to walk         This is a list of the screening recommended for you and due dates:  Health Maintenance  Topic Date Due   DTaP/Tdap/Td vaccine (2 - Td or Tdap) 09/27/2022   COVID-19 Vaccine (4 - 2023-24 season) 12/29/2022*   Zoster (Shingles) Vaccine (1 of 2) 03/14/2023*   Mammogram  12/31/2022   Eye exam for diabetics  05/27/2023   Hemoglobin A1C  05/31/2023   Complete foot exam   06/01/2023   Yearly kidney function blood test for diabetes  11/30/2023   Yearly kidney health urinalysis for diabetes  11/30/2023   Medicare Annual Wellness Visit  12/14/2023   DEXA scan (bone density measurement)  12/31/2026   Pneumonia Vaccine  Completed   Flu Shot  Completed   Hepatitis C Screening: USPSTF Recommendation to screen - Ages 18-79 yo.  Completed   HPV Vaccine  Aged Out   Colon Cancer Screening  Discontinued  *Topic was postponed. The date shown is not the original due date.    Advanced directives: End of life planning; Advanced aging; Advanced directives discussed.  No HCPOA/Living Will.  Additional information available in office. Declined at this time.  Conditions/risks identified: none new.  Next appointment: Follow up in one year for your annual wellness visit    Preventive Care 65 Years and Older, Female Preventive care refers to lifestyle choices and visits with your health care provider that can promote health and wellness. What does preventive care include? A yearly physical exam. This is also called an annual well check. Dental exams once or twice a year. Routine eye exams. Ask your health  care provider how often you should have your eyes checked. Personal lifestyle choices, including: Daily care of your teeth and gums. Regular physical activity. Eating a healthy diet. Avoiding tobacco and drug use. Limiting alcohol use. Practicing safe sex. Taking low-dose aspirin every day. Taking vitamin and mineral supplements as recommended by your health care provider. What happens during an annual well check? The services and screenings done by your health care provider during your annual well check will depend on your age, overall health, lifestyle risk factors, and family history of disease. Counseling  Your health care provider may ask you questions about your: Alcohol use. Tobacco use. Drug use. Emotional well-being. Home and relationship well-being. Sexual activity. Eating habits. History of falls. Memory and ability to understand (cognition). Work and work Statistician. Reproductive health. Screening  You may have the following tests or measurements: Height, weight, and BMI. Blood pressure. Lipid and cholesterol levels. These may be checked every 5 years, or more frequently if you are over 58 years old. Skin check. Lung cancer screening. You may have this screening every year starting at age 51 if you have a 30-pack-year history of smoking and currently smoke or have quit within the past 15 years. Fecal occult blood test (FOBT) of the stool. You may have this test every year starting at age 10. Flexible sigmoidoscopy or colonoscopy. You  may have a sigmoidoscopy every 5 years or a colonoscopy every 10 years starting at age 59. Hepatitis C blood test. Hepatitis B blood test. Sexually transmitted disease (STD) testing. Diabetes screening. This is done by checking your blood sugar (glucose) after you have not eaten for a while (fasting). You may have this done every 1-3 years. Bone density scan. This is done to screen for osteoporosis. You may have this done starting at  age 67. Mammogram. This may be done every 1-2 years. Talk to your health care provider about how often you should have regular mammograms. Talk with your health care provider about your test results, treatment options, and if necessary, the need for more tests. Vaccines  Your health care provider may recommend certain vaccines, such as: Influenza vaccine. This is recommended every year. Tetanus, diphtheria, and acellular pertussis (Tdap, Td) vaccine. You may need a Td booster every 10 years. Zoster vaccine. You may need this after age 3. Pneumococcal 13-valent conjugate (PCV13) vaccine. One dose is recommended after age 35. Pneumococcal polysaccharide (PPSV23) vaccine. One dose is recommended after age 81. Talk to your health care provider about which screenings and vaccines you need and how often you need them. This information is not intended to replace advice given to you by your health care provider. Make sure you discuss any questions you have with your health care provider. Document Released: 01/09/2016 Document Revised: 09/01/2016 Document Reviewed: 10/14/2015 Elsevier Interactive Patient Education  2017 [place] Prevention in the Home Falls can cause injuries. They can happen to people of all ages. There are many things you can do to make your home safe and to help prevent falls. What can I do on the outside of my home? Regularly fix the edges of walkways and driveways and fix any cracks. Remove anything that might make you trip as you walk through a door, such as a raised step or threshold. Trim any bushes or trees on the path to your home. Use bright outdoor lighting. Clear any walking paths of anything that might make someone trip, such as rocks or tools. Regularly check to see if handrails are loose or broken. Make sure that both sides of any steps have handrails. Any raised decks and porches should have guardrails on the edges. Have any leaves, snow, or ice cleared  regularly. Use sand or salt on walking paths during winter. Clean up any spills in your garage right away. This includes oil or grease spills. What can I do in the bathroom? Use night lights. Install grab bars by the toilet and in the tub and shower. Do not use towel bars as grab bars. Use non-skid mats or decals in the tub or shower. If you need to sit down in the shower, use a plastic, non-slip stool. Keep the floor dry. Clean up any water that spills on the floor as soon as it happens. Remove soap buildup in the tub or shower regularly. Attach bath mats securely with double-sided non-slip rug tape. Do not have throw rugs and other things on the floor that can make you trip. What can I do in the bedroom? Use night lights. Make sure that you have a light by your bed that is easy to reach. Do not use any sheets or blankets that are too big for your bed. They should not hang down onto the floor. Have a firm chair that has side arms. You can use this for support while you get dressed. Do not have throw  rugs and other things on the floor that can make you trip. What can I do in the kitchen? Clean up any spills right away. Avoid walking on wet floors. Keep items that you use a lot in easy-to-reach places. If you need to reach something above you, use a strong step stool that has a grab bar. Keep electrical cords out of the way. Do not use floor polish or wax that makes floors slippery. If you must use wax, use non-skid floor wax. Do not have throw rugs and other things on the floor that can make you trip. What can I do with my stairs? Do not leave any items on the stairs. Make sure that there are handrails on both sides of the stairs and use them. Fix handrails that are broken or loose. Make sure that handrails are as long as the stairways. Check any carpeting to make sure that it is firmly attached to the stairs. Fix any carpet that is loose or worn. Avoid having throw rugs at the top or  bottom of the stairs. If you do have throw rugs, attach them to the floor with carpet tape. Make sure that you have a light switch at the top of the stairs and the bottom of the stairs. If you do not have them, ask someone to add them for you. What else can I do to help prevent falls? Wear shoes that: Do not have high heels. Have rubber bottoms. Are comfortable and fit you well. Are closed at the toe. Do not wear sandals. If you use a stepladder: Make sure that it is fully opened. Do not climb a closed stepladder. Make sure that both sides of the stepladder are locked into place. Ask someone to hold it for you, if possible. Clearly mark and make sure that you can see: Any grab bars or handrails. First and last steps. Where the edge of each step is. Use tools that help you move around (mobility aids) if they are needed. These include: Canes. Walkers. Scooters. Crutches. Turn on the lights when you go into a dark area. Replace any light bulbs as soon as they burn out. Set up your furniture so you have a clear path. Avoid moving your furniture around. If any of your floors are uneven, fix them. If there are any pets around you, be aware of where they are. Review your medicines with your doctor. Some medicines can make you feel dizzy. This can increase your chance of falling. Ask your doctor what other things that you can do to help prevent falls. This information is not intended to replace advice given to you by your health care provider. Make sure you discuss any questions you have with your health care provider. Document Released: 10/09/2009 Document Revised: 05/20/2016 Document Reviewed: 01/17/2015 Elsevier Interactive Patient Education  2017 [place]. Opioid Pain Medicine Management Opioids are powerful medicines that are used to treat moderate to severe pain. When used for short periods of time, they can help you to: Sleep better. Do better in physical or occupational  therapy. Feel better in the first few days after an injury. Recover from surgery. Opioids should be taken with the supervision of a trained health care provider. They should be taken for the shortest period of time possible. This is because opioids can be addictive, and the longer you take opioids, the greater your risk of addiction. This addiction can also be called opioid use disorder. What are the risks? Using opioid pain medicines for longer  than 3 days increases your risk of side effects. Side effects include: Constipation. Nausea and vomiting. Breathing difficulties (respiratory depression). Drowsiness. Confusion. Opioid use disorder. Itching. Taking opioid pain medicine for a long period of time can affect your ability to do daily tasks. It also puts you at risk for: Motor vehicle crashes. Depression. Suicide. Heart attack. Overdose, which can be life-threatening. What is a pain treatment plan? A pain treatment plan is an agreement between you and your health care provider. Pain is unique to each person, and treatments vary depending on your condition. To manage your pain, you and your health care provider need to work together. To help you do this: Discuss the goals of your treatment, including how much pain you might expect to have and how you will manage the pain. Review the risks and benefits of taking opioid medicines. Remember that a good treatment plan uses more than one approach and minimizes the chance of side effects. Be honest about the amount of medicines you take and about any drug or alcohol use. Get pain medicine prescriptions from only one health care provider. Pain can be managed with many types of alternative treatments. Ask your health care provider to refer you to one or more specialists who can help you manage pain through: Physical or occupational therapy. Counseling (cognitive behavioral therapy). Good  nutrition. Biofeedback. Massage. Meditation. Non-opioid medicine. Following a gentle exercise program. How to use opioid pain medicine Taking medicine Take your pain medicine exactly as told by your health care provider. Take it only when you need it. If your pain gets less severe, you may take less than your prescribed dose if your health care provider approves. If you are not having pain, do nottake pain medicine unless your health care provider tells you to take it. If your pain is severe, do nottry to treat it yourself by taking more pills than instructed on your prescription. Contact your health care provider for help. Write down the times when you take your pain medicine. It is easy to become confused while on pain medicine. Writing the time can help you avoid overdose. Take other over-the-counter or prescription medicines only as told by your health care provider. Keeping yourself and others safe  While you are taking opioid pain medicine: Do not drive, use machinery, or power tools. Do not sign legal documents. Do not drink alcohol. Do not take sleeping pills. Do not supervise children by yourself. Do not do activities that require climbing or being in high places. Do not go to a lake, river, ocean, spa, or swimming pool. Do not share your pain medicine with anyone. Keep pain medicine in a locked cabinet or in a secure area where pets and children cannot reach it. Stopping your use of opioids If you have been taking opioid medicine for more than a few weeks, you may need to slowly decrease (taper) how much you take until you stop completely. Tapering your use of opioids can decrease your risk of symptoms of withdrawal, such as: Pain and cramping in the abdomen. Nausea. Sweating. Sleepiness. Restlessness. Uncontrollable shaking (tremors). Cravings for the medicine. Do not attempt to taper your use of opioids on your own. Talk with your health care provider about how to do  this. Your health care provider may prescribe a step-down schedule based on how much medicine you are taking and how long you have been taking it. Getting rid of leftover pills Do not save any leftover pills. Get rid of leftover pills safely  by: Taking the medicine to a prescription take-back program. This is usually offered by the county or law enforcement. Bringing them to a pharmacy that has a drug disposal container. Flushing them down the toilet. Check the label or package insert of your medicine to see whether this is safe to do. Throwing them out in the trash. Check the label or package insert of your medicine to see whether this is safe to do. If it is safe to throw it out, remove the medicine from the original container, put it into a sealable bag or container, and mix it with used coffee grounds, food scraps, dirt, or cat litter before putting it in the trash. Follow these instructions at home: Activity Do exercises as told by your health care provider. Avoid activities that make your pain worse. Return to your normal activities as told by your health care provider. Ask your health care provider what activities are safe for you. General instructions You may need to take these actions to prevent or treat constipation: Drink enough fluid to keep your urine pale yellow. Take over-the-counter or prescription medicines. Eat foods that are high in fiber, such as beans, whole grains, and fresh fruits and vegetables. Limit foods that are high in fat and processed sugars, such as fried or sweet foods. Keep all follow-up visits. This is important. Where to find support If you have been taking opioids for a long time, you may benefit from receiving support for quitting from a local support group or counselor. Ask your health care provider for a referral to these resources in your area. Where to find more information Centers for Disease Control and Prevention (CDC): http://www.wolf.info/ [place] Food and  Drug Administration (FDA): [place] Get help right away if: You may have taken too much of an opioid (overdosed). Common symptoms of an overdose: Your breathing is slower or more shallow than normal. You have a very slow heartbeat (pulse). You have slurred speech. You have nausea and vomiting. Your pupils become very small. You have other potential symptoms: You are very confused. You faint or feel like you will faint. You have cold, clammy skin. You have blue lips or fingernails. You have thoughts of harming yourself or harming others. These symptoms may represent a serious problem that is an emergency. Do not wait to see if the symptoms will go away. Get medical help right away. Call your local emergency services (911 in the [place] Do not drive yourself to the hospital.  If you ever feel like you may hurt yourself or others, or have thoughts about taking your own life, get help right away. Go to your nearest emergency department or: Call your local emergency services (911 in the [place] Call the [facility] (920)112-5486 in the [place] Call a suicide crisis helpline, such as the [place] at 8124314407 or 988 in the [place]. This is open 24 hours a day in the [place] Text the Crisis Text Line at 507-612-8325 (in the [place].). Summary Opioid medicines can help you manage moderate to severe pain for a short period of time. A pain treatment plan is an agreement between you and your health care provider. Discuss the goals of your treatment, including how much pain you might expect to have and how you will manage the pain. If you think that you or someone else may have taken too much of an opioid, get medical help right away. This information is not intended to replace advice given to you by your  health care provider. Make sure you discuss any questions you have with your health care provider. Document Revised: 07/08/2021 Document Reviewed:  03/25/2021 Elsevier Patient Education  [place].

## 2022-12-13 NOTE — Progress Notes (Signed)
Subjective:   Renee [name] is a 76 y.o. female who presents for Medicare Annual (Subsequent) preventive examination.  Review of Systems    No ROS.  Medicare Wellness Virtual Visit.  Visual/audio telehealth visit, UTA vital signs.   See social history for additional risk factors.   Cardiac Risk Factors include: advanced age (>38mn, >>43women);diabetes mellitus;hypertension     Objective:    Today's Vitals   12/13/22 1436  BP: 136/65  Pulse: 87  Weight: 137 lb (62.1 kg)  Height: 5' 2.5" (1.588 m)   Body mass index is 24.66 kg/m.     12/13/2022    2:55 PM 10/21/2022   10:18 AM 10/12/2022   11:48 AM 11/30/2021    1:21 PM 10/07/2020    3:56 PM 01/04/2020    9:18 AM 09/25/2018    3:44 PM  Advanced Directives  Does Patient Have a Medical Advance Directive? No No No No Yes No No  Type of APersonnel officerLiving will    Copy of [place] Chart?     No - copy requested    Would patient like information on creating a medical advance directive? No - Patient declined No - Patient declined  Yes (MAU/Ambulatory/Procedural Areas - Information given)   No - Patient declined    Current Medications (verified) Outpatient Encounter Medications as of 12/13/2022  Medication Sig   acetaminophen (TYLENOL) 650 MG CR tablet Take 1,300 mg by mouth every 8 (eight) hours as needed for pain.   aspirin EC 81 MG tablet Take 81 mg by mouth daily.   Blood Glucose Monitoring Suppl (ONETOUCH VERIO) w/Device KIT by Does not apply route. Use to check fasting blood sugar daily and an occasional 2 hr postprandial   Carboxymethylcellulose Sodium (THERATEARS OP) Place 1 drop into both eyes at bedtime as needed (dry eyes).   clobetasol ointment (TEMOVATE) 0.05 % APPLY TOPICALLY DAILY AS NEEDED FOR PSORIASIS FLARES   Coenzyme Q10 (COQ10) 200 MG CAPS Take 200 mg by mouth daily.   Dulaglutide (TRULICITY) 07.91MTA/5.6PVSOPN Inject 0.75 mg into the skin once a  week. Via Lilly Cares PAP   Emollient (CERAVE) CREA Apply 1 application  topically 2 (two) times daily as needed (psoriasis).   ezetimibe (ZETIA) 10 MG tablet Take 1 tablet (10 mg total) by mouth daily.   famotidine (PEPCID) 20 MG tablet Take 20 mg by mouth daily as needed for heartburn or indigestion.   Flaxseed, Linseed, (FLAX SEED OIL PO) Take 1 capsule by mouth daily.   fluticasone (FLONASE) 50 MCG/ACT nasal spray Place 1 spray into both nostrils daily as needed for allergies.   HYDROcodone-acetaminophen (NORCO/VICODIN) 5-325 MG tablet Take 1 tablet by mouth every 4 (four) hours as needed for moderate pain.   Lancets (ONETOUCH ULTRASOFT) lancets Use to check fasting blood sugar daily and an occasional 2 hr postprandial   levothyroxine (SYNTHROID) 75 MCG tablet TAKE 1 TABLET BY MOUTH EVERY DAY   losartan (COZAAR) 50 MG tablet TAKE 1 TABLET BY MOUTH EVERY DAY   MAGNESIUM PO Take 1 tablet by mouth daily.   Multiple Vitamin (MULTIVITAMIN) tablet Take 1 tablet by mouth daily.   nitroGLYCERIN (NITROSTAT) 0.4 MG SL tablet Place 1 tablet (0.4 mg total) under the tongue every 5 (five) minutes as needed.   ondansetron (ZOFRAN) 4 MG tablet Take 1 tablet (4 mg total) by mouth every 8 (eight) hours as needed for nausea or vomiting.  ONETOUCH VERIO test strip USE TO CHECK FASTING BLOOD SUGAR DAILY AND AN OCCASIONAL 2 HR POSTPRANDIAL.   Skin Protectants, Misc. (EUCERIN) cream Apply 1 application  topically as needed (eczema).   triamcinolone cream (KENALOG) 0.1 % Apply 1 application topically 2 (two) times daily as needed (psoriasis).   Vitamin D, Cholecalciferol, 25 MCG (1000 UT) CAPS Take 2,000 Units by mouth daily.   No facility-administered encounter medications on file as of 12/13/2022.    Allergies (verified) Celexa [citalopram], Other, Codeine, Metformin, Robaxin [methocarbamol], Tizanidine, Crestor [rosuvastatin], Nickel, and Simvastatin   History: Past Medical History:  Diagnosis Date    Allergy    Anxiety    Arthritis    CAD (coronary artery disease)    non-obstructive. cath 11/11   Claustrophobia    Diabetes mellitus    borderline diabetic - diet controlled    GERD (gastroesophageal reflux disease)    History of chicken pox    History of phobia    clastrophobia   Hyperlipidemia    Hypertension    Hypothyroidism    Kidney stones    hx of    Lichen sclerosus    Psoriasis    Stroke ([place])    Urinary tract infection    hx of    Past Surgical History:  Procedure Laterality Date   APPENDECTOMY     CARDIAC CATHETERIZATION     2011   CARPAL TUNNEL RELEASE     Left trigger finger   CATARACT EXTRACTION W/PHACO Left 06/06/2018   Procedure: CATARACT EXTRACTION PHACO AND INTRAOCULAR LENS PLACEMENT ([place]);  Surgeon: Birder Robson, MD;  Location: [facility] ORS;  Service: Ophthalmology;  Laterality: Left;  [place]  00:23 AP% 16.0 CDE 3.69 Fluid pack lot # [id number]   CATARACT EXTRACTION W/PHACO Right 09/19/2018   Procedure: CATARACT EXTRACTION PHACO AND INTRAOCULAR LENS PLACEMENT (IOC);  Surgeon: Birder Robson, MD;  Location: [facility] ORS;  Service: Ophthalmology;  Laterality: Right;  [place] 00:29.5 AP% 17.0 CDE$ 5.03 Fluid pack lot # [id number]   COLONOSCOPY WITH PROPOFOL N/A 05/13/2017   Procedure: COLONOSCOPY WITH PROPOFOL;  Surgeon: Manya Silvas, MD;  Location: [facility] ENDOSCOPY;  Service: Endoscopy;  Laterality: N/A;   ESOPHAGOGASTRODUODENOSCOPY (EGD) WITH PROPOFOL N/A 01/04/2020   Procedure: ESOPHAGOGASTRODUODENOSCOPY (EGD) WITH PROPOFOL;  Surgeon: [name], MD;  Location: [facility] ENDOSCOPY;  Service: Gastroenterology;  Laterality: N/A;   EYE SURGERY  2004   lasik - right eye    JOINT REPLACEMENT     KNEE SURGERY Right 2015   arthroscopy   REVERSE SHOULDER ARTHROPLASTY Right 10/21/2022   Procedure: REVERSE SHOULDER ARTHROPLASTY;  Surgeon: Justice Britain, MD;  Location: [facility] ORS;  Service: Orthopedics;  Laterality: Right;  159mn   TOTAL KNEE ARTHROPLASTY Bilateral 06/12/2015    Procedure: TOTAL KNEE BILATERAL;  Surgeon: FGaynelle Arabian MD;  Location: [facility] ORS;  Service: Orthopedics;  Laterality: Bilateral;  and epidural   TUBAL LIGATION     Tubes tied     and untied. and tied again   Family History  Problem Relation Age of Onset   COPD Mother    Diabetes Mother    Hypertension Mother    Hypertension Father    Diabetes Sister    Hypertension Sister    Diabetes Brother    Arthritis Brother    Heart disease Brother    Arthritis Brother    Diabetes Brother    Heart disease Brother    Stomach cancer Maternal Grandfather    Stroke Maternal Grandfather    Breast cancer Daughter  34       Double mastectomy   Social History   Socioeconomic History   Marital status: Divorced    Spouse name: Not on file   Number of children: 6   Years of education: Not on file   Highest education level: Not on file  Occupational History   Occupation: Retired  Tobacco Use   Smoking status: Never   Smokeless tobacco: Never  Vaping Use   Vaping Use: Never used  Substance and Sexual Activity   Alcohol use: Yes    Comment: rare   Drug use: No   Sexual activity: Not Currently    Birth control/protection: Post-menopausal  Other Topics Concern   Not on file  Social History Narrative   Lives alone   Right Handed   Drink 1 cups caffeine daily   Social Determinants of Health   Financial Resource Strain: Low Risk  (12/13/2022)   Overall Financial Resource Strain (CARDIA)    Difficulty of Paying Living Expenses: Not very hard  Food Insecurity: No Food Insecurity (12/13/2022)   Hunger Vital Sign    Worried About Running Out of Food in the Last Year: Never true    [place] in the Last Year: Never true  Transportation Needs: No Transportation Needs (12/13/2022)   PRAPARE - Hydrologist (Medical): No    Lack of Transportation (Non-Medical): No  Physical Activity: Sufficiently Active (12/13/2022)   Exercise Vital Sign    Days of Exercise  per Week: 3 days    Minutes of Exercise per Session: 60 min  Stress: No Stress Concern Present (12/13/2022)   [place]    Feeling of Stress : Not at all  Social Connections: Moderately Isolated (12/13/2022)   Social Connection and Isolation Panel [NHANES]    Frequency of Communication with Friends and Family: More than three times a week    Frequency of Social Gatherings with Friends and Family: More than three times a week    Attends Religious Services: 1 to 4 times per year    Active Member of [employer] or Organizations: No    Attends Music therapist: Never    Marital Status: Divorced    Tobacco Counseling Counseling given: Not Answered   Clinical Intake:  Pre-visit preparation completed: Yes        Diabetes: Yes (Followed by PCP)  How often do you need to have someone help you when you read instructions, pamphlets, or other written materials from your doctor or pharmacy?: 1 - Never  Nutrition Risk Assessment: Has the patient had any N/V/D within the last 2 months?  No  Does the patient have any non-healing wounds?  No  Has the patient had any unintentional weight loss or weight gain?  No   Diabetes: Is the patient diabetic?  Yes  If diabetic, was a CBG obtained today?  Yes , FBS 118 Did the patient bring in their glucometer from home?  No  How often do you monitor your CBG's? One day a week.   Financial Strains and Diabetes Management: Are you having any financial strains with the device, your supplies or your medication? No .  Does the patient want to be seen by Chronic Care Management for management of their diabetes?  No  Would the patient like to be referred to a Nutritionist or for Diabetic Management?  No    Interpreter Needed?: No  Activities of Daily Living    12/13/2022    2:41 PM 10/12/2022   11:52 AM  In your present state of health, do you have any difficulty  performing the following activities:  Hearing? 0   Vision? 0   Difficulty concentrating or making decisions? 0   Walking or climbing stairs? 0   Dressing or bathing? 0   Doing errands, shopping? 0 0  Preparing Food and eating ? N   Using the Toilet? N   In the past six months, have you accidently leaked urine? Y   Comment Managed with daily pad   Do you have problems with loss of bowel control? N   Managing your Medications? N   Managing your Finances? N   Comment Daughter assist as needed   Housekeeping or managing your Housekeeping? N     Patient Care Team: Jinny Sanders, MD as PCP - General (Family Medicine) Gaynelle Arabian, MD as Consulting Physician (Orthopedic Surgery) Minna Merritts, MD as Consulting Physician (Cardiology) Dasher, Rayvon Char, MD as Consulting Physician (Dermatology) Birder Robson, MD as Referring Physician (Ophthalmology) Charlton Haws, [facility] as Pharmacist (Pharmacist)  Indicate any recent Medical Services you may have received from other than [facility] providers in the past year (date may be approximate).     Assessment:   This is a routine wellness examination for Renee [name].  I connected with  [name] on 12/13/22 by a audio enabled telemedicine application and verified that I am speaking with the correct person using two identifiers.  Patient Location: Home  Provider Location: Office/Clinic  I discussed the limitations of evaluation and management by telemedicine. The patient expressed understanding and agreed to proceed.   Hearing/Vision screen Hearing Screening - Comments:: Patient is able to hear conversational tones without difficulty.  No issues reported.   Vision Screening - Comments:: Followed by [facility], Dr. Percell [place] Cataracts extracted, bilateral Annual exams No retinopathy reported  Dietary issues and exercise activities discussed: Current Exercise Habits: Home exercise routine, Type of exercise: walking, Time  (Minutes): 60, Frequency (Times/Week): 3, Weekly Exercise (Minutes/Week): 180, Intensity: Mild Low carb diet Good water intake    Goals Addressed             This Visit's Progress    Patient Stated   On track    Would like to continue to walk       Depression Screen    12/13/2022    2:46 PM 12/07/2022    3:22 PM 12/01/2021    3:17 PM 11/30/2021    1:27 PM 05/29/2021   11:24 AM 01/15/2021   12:16 PM 10/07/2020    3:58 PM  PHQ 2/9 Scores  PHQ - 2 Score 0 0 2 0 0 0 4  PHQ- 9 Score 0 _0 0 4    Fall Risk    12/13/2022    2:56 PM 11/30/2021    1:25 PM 10/07/2020    3:58 PM 10/05/2019    2:35 PM 09/25/2018    3:43 PM  Fall Risk   Falls in the past year? 0 0 1 0 No  Number falls in past yr: 0 0 1    Injury with Fall? 0 0 0    Risk for fall due to :  No Fall Risks Medication side effect    Follow up Falls evaluation completed;Falls prevention discussed Falls prevention discussed Falls evaluation completed;Falls prevention discussed      FALL RISK PREVENTION PERTAINING TO  THE HOME: Home free of loose throw rugs in walkways, pet beds, electrical cords, etc? Yes  Adequate lighting in your home to reduce risk of falls? Yes   ASSISTIVE DEVICES UTILIZED TO PREVENT FALLS: Life alert? No  Use of a cane, walker or w/c? No  Grab bars in the bathroom? Yes  Shower chair or bench in shower? Yes Elevated toilet seat or a handicapped toilet? Yes   TIMED UP AND GO: Was the test performed? No .   Cognitive Function:    11/29/2022    3:41 PM 03/16/2022    1:05 PM 09/16/2021   10:38 AM 10/07/2020    4:05 PM 09/25/2018    3:43 PM  MMSE - Mini Mental State Exam  Orientation to time _0 Orientation to time comments said winter instead fall      Orientation to Place _1 Registration _2 Attention/ Calculation 3 0 2 5 0  Recall _3 Language- name 2 objects _4 0  Language- repeat 0 0 _5 Language- follow 3 step command _6 Language- read  & follow direction _7 0  Write a sentence _8 0  Copy design 0 0 1  0  Total score _9 12/13/2022    3:02 PM  6CIT Screen  What Year? 0 points  What month? 0 points  What time? 0 points  Count back from 20 0 points  Months in reverse 0 points  Repeat phrase 2 points  Total Score 2 points    Immunizations Immunization History  Administered Date(s) Administered   Fluad Quad(high Dose 65+) 08/28/2022   H1N1 11/02/2008   Influenza Split 10/22/2006, 10/28/2007, 09/26/2008, 09/13/2009, 09/12/2010, 09/18/2011, 09/27/2012   Influenza, High Dose Seasonal PF 09/11/2016, 08/28/2019, 09/22/2020, 10/03/2021   Influenza,inj,Quad PF,6+ Mos 09/13/2013, 09/13/2014, 09/20/2017, 09/25/2018   Influenza-Unspecified 08/27/2014, 09/18/2015   PFIZER(Purple Top)SARS-COV-2 Vaccination 01/03/2020, 01/26/2020, 11/05/2020   Pneumococcal Conjugate-13 04/05/2014   Pneumococcal Polysaccharide-23 09/26/2008, 02/08/2017   Tdap 09/27/2012   TDAP status: Due, Education has been provided regarding the importance of this vaccine. Advised may receive this vaccine at local pharmacy or Health Dept. Aware to provide a copy of the vaccination record if obtained from local pharmacy or Health Dept. Verbalized acceptance and understanding.  Covid-19 vaccine status: Completed vaccines x3.    Shingrix Completed?: No.    Education has been provided regarding the importance of this vaccine. Patient has been advised to call insurance company to determine out of pocket expense if they have not yet received this vaccine. Advised may also receive vaccine at local pharmacy or Health Dept. Verbalized acceptance and understanding.  Screening Tests Health Maintenance  Topic Date Due   DTaP/Tdap/Td (2 - Td or Tdap) 09/27/2022   COVID-19 Vaccine (4 - 2023-24 season) 12/29/2022 (Originally 08/27/2022)   Zoster Vaccines- Shingrix (1 of 2) 03/14/2023 (Originally 09[birth year]01/1994)   MAMMOGRAM  12/31/2022   OPHTHALMOLOGY  EXAM  05/27/2023   HEMOGLOBIN A1C  05/31/2023   FOOT EXAM  06/01/2023   Diabetic kidney evaluation - eGFR measurement  11/30/2023   Diabetic kidney evaluation - Urine ACR  11/30/2023   Medicare Annual Wellness (AWV)  12/14/2023   DEXA SCAN  12/31/2026   Pneumonia Vaccine 57+ Years old  Completed   INFLUENZA VACCINE  Completed   Hepatitis C Screening  Completed   HPV VACCINES  Aged Out   COLONOSCOPY (Pts 45-68yr Insurance coverage will need to be confirmed)  Discontinued   Health Maintenance Health Maintenance Due  Topic Date Due   DTaP/Tdap/Td (2 - Td or Tdap) 09/27/2022   Lung Cancer Screening: (Low Dose CT Chest recommended if Age 78-80years, 30 pack-year currently smoking OR have quit w/in 15years.) does not qualify.   Hepatitis C Screening: Completed 2017.   Vision Screening: Recommended annual ophthalmology exams for early detection of glaucoma and other disorders of the eye.  Dental Screening: Recommended annual dental exams for proper oral hygiene.  Community Resource Referral / Chronic Care Management: CRR required this visit?  No   CCM required this visit?  No      Plan:     I have personally reviewed and noted the following in the patient's chart:   Medical and social history Use of alcohol, tobacco or illicit drugs  Current medications and supplements including opioid prescriptions. Patient is currently taking opioid prescriptions. Information provided to patient regarding non-opioid alternatives. Patient advised to discuss non-opioid treatment plan with their provider. Followed by Ortho. Functional ability and status Nutritional status Physical activity Advanced directives List of other physicians Hospitalizations, surgeries, and ER visits in previous 12 months Vitals Screenings to include cognitive, depression, and falls Referrals and appointments  In addition, I have reviewed and discussed with patient certain preventive protocols, quality metrics,  and best practice recommendations. A written personalized care plan for preventive services as well as general preventive health recommendations were provided to patient.     DLeta Jungling LPN   [id number]

## 2022-12-14 ENCOUNTER — Ambulatory Visit: Payer: Medicare Other | Admitting: Physical Therapy

## 2022-12-14 ENCOUNTER — Ambulatory Visit: Payer: Medicare Other | Attending: Cardiovascular Disease | Admitting: Cardiovascular Disease

## 2022-12-14 ENCOUNTER — Encounter: Payer: Self-pay | Admitting: Cardiovascular Disease

## 2022-12-14 ENCOUNTER — Encounter: Payer: Self-pay | Admitting: Physical Therapy

## 2022-12-14 VITALS — BP 140/60 | HR 83 | Ht 62.0 in | Wt 138.0 lb

## 2022-12-14 DIAGNOSIS — E1159 Type 2 diabetes mellitus with other circulatory complications: Secondary | ICD-10-CM | POA: Diagnosis not present

## 2022-12-14 DIAGNOSIS — E7800 Pure hypercholesterolemia, unspecified: Secondary | ICD-10-CM

## 2022-12-14 DIAGNOSIS — I10 Essential (primary) hypertension: Secondary | ICD-10-CM | POA: Diagnosis not present

## 2022-12-14 DIAGNOSIS — T466X5D Adverse effect of antihyperlipidemic and antiarteriosclerotic drugs, subsequent encounter: Secondary | ICD-10-CM

## 2022-12-14 DIAGNOSIS — M25511 Pain in right shoulder: Secondary | ICD-10-CM

## 2022-12-14 DIAGNOSIS — I25118 Atherosclerotic heart disease of native coronary artery with other forms of angina pectoris: Secondary | ICD-10-CM | POA: Diagnosis not present

## 2022-12-14 DIAGNOSIS — M6281 Muscle weakness (generalized): Secondary | ICD-10-CM

## 2022-12-14 DIAGNOSIS — T466X5A Adverse effect of antihyperlipidemic and antiarteriosclerotic drugs, initial encounter: Secondary | ICD-10-CM | POA: Insufficient documentation

## 2022-12-14 DIAGNOSIS — M791 Myalgia, unspecified site: Secondary | ICD-10-CM

## 2022-12-14 DIAGNOSIS — M25611 Stiffness of right shoulder, not elsewhere classified: Secondary | ICD-10-CM | POA: Diagnosis not present

## 2022-12-14 DIAGNOSIS — E119 Type 2 diabetes mellitus without complications: Secondary | ICD-10-CM

## 2022-12-14 NOTE — Patient Instructions (Addendum)
Research repatha or praluent injection for cholesterol   Medication Instructions:  No changes  If you need a refill on your cardiac medications before your next appointment, please call your pharmacy.   Lab work: No new labs needed  Testing/Procedures: No new testing needed  Follow-Up: At [facility], you and your health needs are our priority.  As part of our continuing mission to provide you with exceptional heart care, we have created designated Provider Care Teams.  These Care Teams include your primary Cardiologist (physician) and Advanced Practice Providers (APPs -  Physician Assistants and Nurse Practitioners) who all work together to provide you with the care you need, when you need it.  You will need a follow up appointment in 12 months  Providers on your designated Care Team:   Murray Hodgkins, NP Christell Faith, PA-C Cadence Kathlen Mody, [place]  COVID-19 Vaccine Information can be found at: [email or web address] For questions related to vaccine distribution or appointments, please email [email or web address] or call [phone].

## 2022-12-14 NOTE — Therapy (Unsigned)
OUTPATIENT PHYSICAL THERAPY TREATMENT NOTE   Patient Name: Renee [name] MRN: [id number] DOB:1946/04/25, 76 y.o., female Today's Date: 12/14/2022  PCP: Jinny Sanders, MD  REFERRING PROVIDER: Jenetta Loges, PA-C   END OF SESSION:   PT End of Session - 12/14/22 1050     Visit Number 8    Number of Visits 17    Date for PT Re-Evaluation 01/13/23    Authorization Type Medicare 2023    Progress Note Due on Visit 10    PT Start Time 1032    PT Stop Time 1113    PT Time Calculation (min) 41 min    Activity Tolerance No increased pain    Behavior During Therapy WFL for tasks assessed/performed              Past Medical History:  Diagnosis Date   Allergy    Anxiety    Arthritis    CAD (coronary artery disease)    non-obstructive. cath 11/11   Claustrophobia    Diabetes mellitus    borderline diabetic - diet controlled    GERD (gastroesophageal reflux disease)    History of chicken pox    History of phobia    clastrophobia   Hyperlipidemia    Hypertension    Hypothyroidism    Kidney stones    hx of    Lichen sclerosus    Psoriasis    Stroke ([place])    Urinary tract infection    hx of    Past Surgical History:  Procedure Laterality Date   APPENDECTOMY     CARDIAC CATHETERIZATION     2011   CARPAL TUNNEL RELEASE     Left trigger finger   CATARACT EXTRACTION W/PHACO Left 06/06/2018   Procedure: CATARACT EXTRACTION PHACO AND INTRAOCULAR LENS PLACEMENT ([place]);  Surgeon: Birder Robson, MD;  Location: [facility] ORS;  Service: Ophthalmology;  Laterality: Left;  [place]  00:23 AP% 16.0 CDE 3.69 Fluid pack lot # [id number]   CATARACT EXTRACTION W/PHACO Right 09/19/2018   Procedure: CATARACT EXTRACTION PHACO AND INTRAOCULAR LENS PLACEMENT ([facility]);  Surgeon: Birder Robson, MD;  Location: [facility];  Service: Ophthalmology;  Laterality: Right;  [place] 00:29.5 AP% 17.0 CDE$ 5.03 Fluid pack lot # [id number]   COLONOSCOPY WITH PROPOFOL N/A 05/13/2017   Procedure: COLONOSCOPY WITH  PROPOFOL;  Surgeon: Manya Silvas, MD;  Location: [facility] ENDOSCOPY;  Service: Endoscopy;  Laterality: N/A;   ESOPHAGOGASTRODUODENOSCOPY (EGD) WITH PROPOFOL N/A 01/04/2020   Procedure: ESOPHAGOGASTRODUODENOSCOPY (EGD) WITH PROPOFOL;  Surgeon: [name], MD;  Location: [facility] ENDOSCOPY;  Service: Gastroenterology;  Laterality: N/A;   EYE SURGERY  2004   lasik - right eye    JOINT REPLACEMENT     KNEE SURGERY Right 2015   arthroscopy   REVERSE SHOULDER ARTHROPLASTY Right 10/21/2022   Procedure: REVERSE SHOULDER ARTHROPLASTY;  Surgeon: Justice Britain, MD;  Location: [facility] ORS;  Service: Orthopedics;  Laterality: Right;  169mn   TOTAL KNEE ARTHROPLASTY Bilateral 06/12/2015   Procedure: TOTAL KNEE BILATERAL;  Surgeon: FGaynelle Arabian MD;  Location: [facility] ORS;  Service: Orthopedics;  Laterality: Bilateral;  and epidural   TUBAL LIGATION     Tubes tied     and untied. and tied again   Patient Active Problem List   Diagnosis Date Noted   Pain in joint of left shoulder 11/03/2022   Statin myopathy 06/04/2022   Hordeolum externum of left upper eyelid 02/17/2022   Acute gout involving toe of right foot 08/12/2021   Chronic pain of both  shoulders 08/12/2021   History of TIA (transient ischemic attack) 06/26/2021   Statin intolerance 01/15/2021   MDD (major depressive disorder), recurrent episode, moderate (HCC) 07/08/2020   Chronic insomnia 07/08/2020   Chronic neck pain 10/05/2019   Chronic diarrhea 03/30/2018   GAD (generalized anxiety disorder) 01/31/2018   Osteopenia 12/08/2017   Vitamin D deficiency 03/04/2016   Allergic rhinitis 03/04/2016   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)  ([place]) 10/20/2015   External hemorrhoid 10/20/2015   Psoriasis 10/20/2015   History of bilateral knee replacement 10/20/2015   Osteoarthritis of knee 11/25/2014   Hypertension associated with diabetes ([place]) 11/06/2010   CAD, NATIVE VESSEL 11/06/2010   Hyperlipidemia associated with type  2 diabetes mellitus ([place]) 01/25/2006   Adult hypothyroidism 11/15/2005    REFERRING DIAG: Z96.619 (ICD-10-CM) - Presence of unspecified artificial shoulder joint    THERAPY DIAG:  Acute pain of right shoulder  Stiffness of right shoulder, not elsewhere classified  Muscle weakness (generalized)  Rationale for Evaluation and Treatment Rehabilitation  PERTINENT HISTORY: Pt is 76 year old female s/p right reverse TSA 10/21/22. Patient reports performing home exercises given by [facility] including pendulums, elbow AROM, wrist AROM, and sliding hand on her lap. Patient reports more pain rTSA compared to bilat TKA. Pt has been released from using her sling; pt uses only for going out in public and walking outside. Pt denies significant post-operative complications.    Pain:  Pain Intensity: Present: 3-4/10, Best: 1-2/10, Worst: 8-9/10 Pain location: Lateral arm and down to R elbow Pain Quality: aching  Radiating: Yes , down to R elbow Numbness/Tingling: Yes, NT down to her R fingers, affecting all digits  Aggravating factors: trying to lie down, raising arm inadvertently Relieving factors: avoiding unnecessary R shoulder activity  History of prior shoulder or neck/shoulder injury, pain, surgery, or therapy: Yes, PT prior to rTSA  Falls: Has patient fallen in last 6 months? No, Number of falls: N/A Dominant hand: left Imaging: Yes , X-rays post-operatively demonstrated good healing, no hardware issues   Prior level of function: Independent Occupational demands: N/A Hobbies:  walking exercise regimen Red flags (personal history of cancer, chills/fever, night sweats, nausea, vomiting, unrelenting pain): Negative   Precautions: rTSA post-op precautions   Weight Bearing Restrictions: No   Living Environment Lives with: lives alone, children live in [place] close to her; 4 daughters, 2 sons Lives in: House/apartment     Patient Goals: Return to walking exercise regimen, able to  travel/drive as needed    PRECAUTIONS: ER to 45, Abduction to 90, Flexion to 115 deg (weeks 4-8)  Right Reverse TSA, DOS: 10/21/22    SUBJECTIVE:  SUBJECTIVE STATEMENT:  Patient reports feeling more general aching in R elbow and contralateral shoulder with recent cold weather. She reports tolerating her last visit well and reports compliance with her HEP. 2/10 pain today.   PAIN:  Are you having pain? Yes, NPRS 2/10   OBJECTIVE: (objective measures completed at initial evaluation unless otherwise dated)   Patient Surveys  FOTO: 29, predicted outcome score of 62   Cognition Patient is oriented to person, place, and time.  Recent memory is intact.  Remote memory is intact.  Attention span and concentration are intact.  Expressive speech is intact.  Patient's fund of knowledge is within normal limits for educational level.                          Gross Musculoskeletal Assessment Tremor: None Bulk: Normal Tone: Normal     Posture Forward head, rounded shoulders, inc thoracic kyphosis       AROM       AROM (Normal range in degrees) AROM 11/17/2022    Right Left  Shoulder      Flexion Deferred Deferred  Extension Deferred Deferred  Abduction Deferred Deferred  External Rotation Deferred Deferred  Internal Rotation Deferred Deferred  Hands Behind Head Deferred Deferred  Hands Behind Back Deferred Deferred         Elbow      Flexion WNL WNL  Extension -15 WNL  Pronation WNL WNL  Supination WNL WNL  (* = pain; Blank rows = not tested)     Shoulder PROM Flexion: R 90 deg Abduction: R 40 deg ER: R 30 deg     LE MMT: MMTs deferred due to post-operative status* MMT (out of 5) Right 11/17/2022 Left 11/17/2022         Shoulder   Flexion      Extension      Abduction       External rotation      Internal rotation      Horizontal abduction      Horizontal adduction      Lower Trapezius      Rhomboids             Elbow  Flexion      Extension      Pronation      Supination             Wrist  Flexion      Extension      Radial deviation      Ulnar deviation             MCP  Flexion      Extension      Abduction      Adduction      (* = pain; Blank rows = not tested)   Sensation Grossly intact to light touch bilateral UE as determined by testing dermatomes C2-T2. Proprioception and hot/cold testing deferred on this date.     Palpation   Location LEFT  RIGHT           Subocciptials      Cervical paraspinals      Upper Trapezius   1  Levator Scapulae      Rhomboid Major/Minor      Sternoclavicular joint      Acromioclavicular joint      Coracoid process      Long head of biceps      Supraspinatus      Infraspinatus  Subscapularis      Teres Minor      Teres Major      Pectoralis Major   0  Pectoralis Minor   0  Anterior Deltoid   1  Lateral Deltoid   1  Posterior Deltoid   1  Latissimus Dorsi      Sternocleidomastoid      (Blank rows = not tested) Graded on 0-4 scale (0 = no pain, 1 = pain, 2 = pain with wincing/grimacing/flinching, 3 = pain with withdrawal, 4 = unwilling to allow palpation), (Blank rows = not tested)     Accessory Motions/Glides Deferred       TODAY'S TREATMENT      Manual Therapy - for symptom modulation, soft tissue sensitivity and mobility, shoulder ROM   R shoulder PROM flexion, ER, IR hand to abdomen, and abd up to limits of protocol; x 10 minutes STM R middle and anterior deltoid, biceps; x 2 minutes    Therapeutic Exercise - for shoulder complex ROM as needed for functional reaching and household chores;    Shoulder flexion AROM with lawn chair progression; bed raised to 60 deg; 2x10 in each position     Standing shoulder abduction to 90 deg AROM; 2x10, utilized mirror feedback to  limit scapular hiking    Wall wash for forward flexion; 2x10   Wall ball circles; x20 ea dir with shoulder at 90 deg forward elevation    Standing Tband row; Green Tband; x20  -heavy verbal cueing and demonstration to avoid shoulder extension beyond neutral  Shoulder isometrics; flexion, ER; x15, 5 sec hold (perform abduction next visit)  -moderate verbal cueing and demonstration for technique and limited force with submaximal isometrics       *not today* Sidelying shoulder abduction to 90 deg; L sidelying; 1x10  Finger ladder, forward elevation; x10 Supine; Wand flexion AAROM; 1x10 - verbal cueing for safe ROM, forward elevation to 115 Supine; Wand ER AAROM; to 45 deg only; 2x10  Shoulder ER with elbow on tabletop, in sitting; x20 Pulley, forward elevation; x 2 minutes         PATIENT EDUCATION:  Education details: Plan of care, HEP Person educated: Patient Education method: Explanation Education comprehension: verbalized understanding     HOME EXERCISE PROGRAM: Exercises - Supine Shoulder Flexion AAROM with dowel (up to 90 deg only); 2x10, 2x/day -Gripping, theraputty; 2-3 min; 2x/day -Pt continuing HEP given from referring clinic (elbow AROM, pendulums, wrist and digit AROM)       ASSESSMENT:   CLINICAL IMPRESSION: 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 Pt has no difficulty with PROM up to current limits of protocol, but she does have remaining weakness with functional reaching/forward elevation of shoulder in available range. Pt participates well with PT, and is progressing appropriately at this point post-operatively. Will increase degree ROM utilized with PROM/AAROM/AROM following 8 weeks. Patient will benefit from continued skilled therapeutic intervention to address the above deficits as needed for improved function and QoL.    REHAB  POTENTIAL: Good   CLINICAL DECISION MAKING: Stable/uncomplicated   EVALUATION COMPLEXITY: Low     GOALS: Goals reviewed with patient? Yes   SHORT TERM GOALS: Target date: 12/15/2022   Pt will be independent with HEP to improve strength and decrease neck pain to improve pain-free function at home and work. Baseline: 11/16/22: Baseline HEP initiated, HEP previously given from referring office  reviewed.   Goal status: INITIAL     LONG TERM GOALS: Target date: 01/12/2023   Pt will increase FOTO to at least 53 to demonstrate significant improvement in function at home and work related to neck pain  Baseline: 11/16/22: FOTO 29.   Goal status: INITIAL   2.  Pt will decrease worst shoulder pain by at least 3 points on the NPRS in order to demonstrate clinically significant reduction in shoulder pain. Baseline: 11/16/22: 8-9/10 at worst Goal status: INITIAL   3.   Patient will have shoulder flexion AROM to at least 130 deg as needed for functional reaching, self-care activities, household chores Baseline: 11/16/22: R shoulder flexion AROM not tested due to post-op status, flexion PROM to 90 deg (up to current restriction).    Goal status: INITIAL   4. Pt will increase strength of tested shoulder musculature to at least 4/5 or greater MMT grade in order to demonstrate improvement in strength and function as needed for light to moderate lifting and household chores          Baseline: 11/16/22: MMT 2-/5 globally given decreased shoulder ROM.  Goal status: INITIAL     PLAN: PT FREQUENCY: 2x/week   PT DURATION: 8 weeks   PLANNED INTERVENTIONS: Therapeutic exercises, Therapeutic activity, Neuromuscular re-education, Balance training, Gait training, Patient/Family education, Joint manipulation, Joint mobilization, Dry Needling, Electrical stimulation, Cryotherapy, Moist heat, Traction, Ultrasound, Ionotophoresis '4mg'$ /ml Dexamethasone, and Manual therapy   PLAN FOR NEXT SESSION: Gentle R  shoulder PROM within pt tolerance and up to limits of restrictions per protocol. Continue with AAROM and AROM as tolerated within post-operative guidelines. Continue with isometrics and gentle periscapular isotonics without R shoulder hyperextension.      Valentina Gu, PT, DPT #[id number]  Eilleen Kempf, PT 12/14/2022, 10:51 AM

## 2022-12-16 ENCOUNTER — Ambulatory Visit: Payer: Medicare Other | Admitting: Physical Therapy

## 2022-12-16 ENCOUNTER — Encounter: Payer: Self-pay | Admitting: Physical Therapy

## 2022-12-16 DIAGNOSIS — M25511 Pain in right shoulder: Secondary | ICD-10-CM | POA: Diagnosis not present

## 2022-12-16 DIAGNOSIS — M6281 Muscle weakness (generalized): Secondary | ICD-10-CM

## 2022-12-16 DIAGNOSIS — M25611 Stiffness of right shoulder, not elsewhere classified: Secondary | ICD-10-CM | POA: Diagnosis not present

## 2022-12-16 NOTE — Therapy (Signed)
OUTPATIENT PHYSICAL THERAPY TREATMENT NOTE   Patient Name: Renee [name] MRN: [id number] DOB:Dec 10, 1946, 76 y.o., female Today's Date: 12/16/2022  PCP: Jinny Sanders, MD  REFERRING PROVIDER: Jenetta Loges, PA-C   END OF SESSION:   PT End of Session - 12/16/22 1044     Visit Number 9    Number of Visits 17    Date for PT Re-Evaluation 01/13/23    Authorization Type Medicare 2023    Progress Note Due on Visit 10    PT Start Time 1031    PT Stop Time 1114    PT Time Calculation (min) 43 min    Activity Tolerance No increased pain    Behavior During Therapy WFL for tasks assessed/performed               Past Medical History:  Diagnosis Date   Allergy    Anxiety    Arthritis    CAD (coronary artery disease)    non-obstructive. cath 11/11   Claustrophobia    Diabetes mellitus    borderline diabetic - diet controlled    GERD (gastroesophageal reflux disease)    History of chicken pox    History of phobia    clastrophobia   Hyperlipidemia    Hypertension    Hypothyroidism    Kidney stones    hx of    Lichen sclerosus    Psoriasis    Stroke ([place])    Urinary tract infection    hx of    Past Surgical History:  Procedure Laterality Date   APPENDECTOMY     CARDIAC CATHETERIZATION     2011   CARPAL TUNNEL RELEASE     Left trigger finger   CATARACT EXTRACTION W/PHACO Left 06/06/2018   Procedure: CATARACT EXTRACTION PHACO AND INTRAOCULAR LENS PLACEMENT ([place]);  Surgeon: Birder Robson, MD;  Location: [facility] ORS;  Service: Ophthalmology;  Laterality: Left;  [place]  00:23 AP% 16.0 CDE 3.69 Fluid pack lot # [id number]   CATARACT EXTRACTION W/PHACO Right 09/19/2018   Procedure: CATARACT EXTRACTION PHACO AND INTRAOCULAR LENS PLACEMENT ([facility]);  Surgeon: Birder Robson, MD;  Location: [facility];  Service: Ophthalmology;  Laterality: Right;  [place] 00:29.5 AP% 17.0 CDE$ 5.03 Fluid pack lot # [id number]   COLONOSCOPY WITH PROPOFOL N/A 05/13/2017   Procedure: COLONOSCOPY WITH  PROPOFOL;  Surgeon: Manya Silvas, MD;  Location: [facility] ENDOSCOPY;  Service: Endoscopy;  Laterality: N/A;   ESOPHAGOGASTRODUODENOSCOPY (EGD) WITH PROPOFOL N/A 01/04/2020   Procedure: ESOPHAGOGASTRODUODENOSCOPY (EGD) WITH PROPOFOL;  Surgeon: [name], MD;  Location: [facility] ENDOSCOPY;  Service: Gastroenterology;  Laterality: N/A;   EYE SURGERY  2004   lasik - right eye    JOINT REPLACEMENT     KNEE SURGERY Right 2015   arthroscopy   REVERSE SHOULDER ARTHROPLASTY Right 10/21/2022   Procedure: REVERSE SHOULDER ARTHROPLASTY;  Surgeon: Justice Britain, MD;  Location: [facility] ORS;  Service: Orthopedics;  Laterality: Right;  137mn   TOTAL KNEE ARTHROPLASTY Bilateral 06/12/2015   Procedure: TOTAL KNEE BILATERAL;  Surgeon: FGaynelle Arabian MD;  Location: [facility];  Service: Orthopedics;  Laterality: Bilateral;  and epidural   TUBAL LIGATION     Tubes tied     and untied. and tied again   Patient Active Problem List   Diagnosis Date Noted   Pain in joint of left shoulder 11/03/2022   Statin myopathy 06/04/2022   Hordeolum externum of left upper eyelid 02/17/2022   Acute gout involving toe of right foot 08/12/2021   Chronic pain of  both shoulders 08/12/2021   History of TIA (transient ischemic attack) 06/26/2021   Statin intolerance 01/15/2021   MDD (major depressive disorder), recurrent episode, moderate (HCC) 07/08/2020   Chronic insomnia 07/08/2020   Chronic neck pain 10/05/2019   Chronic diarrhea 03/30/2018   GAD (generalized anxiety disorder) 01/31/2018   Osteopenia 12/08/2017   Vitamin D deficiency 03/04/2016   Allergic rhinitis 03/04/2016   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)  ([place]) 10/20/2015   External hemorrhoid 10/20/2015   Psoriasis 10/20/2015   History of bilateral knee replacement 10/20/2015   Osteoarthritis of knee 11/25/2014   Hypertension associated with diabetes ([place]) 11/06/2010   CAD, NATIVE VESSEL 11/06/2010   Hyperlipidemia associated with type  2 diabetes mellitus ([place]) 01/25/2006   Adult hypothyroidism 11/15/2005    REFERRING DIAG: Z96.619 (ICD-10-CM) - Presence of unspecified artificial shoulder joint    THERAPY DIAG:  Acute pain of right shoulder  Stiffness of right shoulder, not elsewhere classified  Muscle weakness (generalized)  Rationale for Evaluation and Treatment Rehabilitation  PERTINENT HISTORY: Pt is 76 year old female s/p right reverse TSA 10/21/22. Patient reports performing home exercises given by [facility] including pendulums, elbow AROM, wrist AROM, and sliding hand on her lap. Patient reports more pain rTSA compared to bilat TKA. Pt has been released from using her sling; pt uses only for going out in public and walking outside. Pt denies significant post-operative complications.    Pain:  Pain Intensity: Present: 3-4/10, Best: 1-2/10, Worst: 8-9/10 Pain location: Lateral arm and down to R elbow Pain Quality: aching  Radiating: Yes , down to R elbow Numbness/Tingling: Yes, NT down to her R fingers, affecting all digits  Aggravating factors: trying to lie down, raising arm inadvertently Relieving factors: avoiding unnecessary R shoulder activity  History of prior shoulder or neck/shoulder injury, pain, surgery, or therapy: Yes, PT prior to rTSA  Falls: Has patient fallen in last 6 months? No, Number of falls: N/A Dominant hand: left Imaging: Yes , X-rays post-operatively demonstrated good healing, no hardware issues   Prior level of function: Independent Occupational demands: N/A Hobbies:  walking exercise regimen Red flags (personal history of cancer, chills/fever, night sweats, nausea, vomiting, unrelenting pain): Negative   Precautions: rTSA post-op precautions   Weight Bearing Restrictions: No   Living Environment Lives with: lives alone, children live in [place] close to her; 4 daughters, 2 sons Lives in: House/apartment     Patient Goals: Return to walking exercise regimen, able to  travel/drive as needed    PRECAUTIONS: PROM and AROM as tolerated weeks 8-12  Right Reverse TSA, DOS: 10/21/22    SUBJECTIVE:  SUBJECTIVE STATEMENT:  Patient reports improved aching compared to earlier this week. Patient reports no notable soreness after last visit. No new complaints this AM.   PAIN:  Are you having pain? Yes, NPRS 2/10   OBJECTIVE: (objective measures completed at initial evaluation unless otherwise dated)   Patient Surveys  FOTO: 29, predicted outcome score of 73   Cognition Patient is oriented to person, place, and time.  Recent memory is intact.  Remote memory is intact.  Attention span and concentration are intact.  Expressive speech is intact.  Patient's fund of knowledge is within normal limits for educational level.                          Gross Musculoskeletal Assessment Tremor: None Bulk: Normal Tone: Normal     Posture Forward head, rounded shoulders, inc thoracic kyphosis       AROM       AROM (Normal range in degrees) AROM 11/17/2022    Right Left  Shoulder      Flexion Deferred Deferred  Extension Deferred Deferred  Abduction Deferred Deferred  External Rotation Deferred Deferred  Internal Rotation Deferred Deferred  Hands Behind Head Deferred Deferred  Hands Behind Back Deferred Deferred         Elbow      Flexion WNL WNL  Extension -15 WNL  Pronation WNL WNL  Supination WNL WNL  (* = pain; Blank rows = not tested)     Shoulder PROM Flexion: R 90 deg Abduction: R 40 deg ER: R 30 deg     LE MMT: MMTs deferred due to post-operative status* MMT (out of 5) Right 11/17/2022 Left 11/17/2022         Shoulder   Flexion      Extension      Abduction      External rotation      Internal rotation      Horizontal abduction       Horizontal adduction      Lower Trapezius      Rhomboids             Elbow  Flexion      Extension      Pronation      Supination             Wrist  Flexion      Extension      Radial deviation      Ulnar deviation             MCP  Flexion      Extension      Abduction      Adduction      (* = pain; Blank rows = not tested)   Sensation Grossly intact to light touch bilateral UE as determined by testing dermatomes C2-T2. Proprioception and hot/cold testing deferred on this date.     Palpation   Location LEFT  RIGHT           Subocciptials      Cervical paraspinals      Upper Trapezius   1  Levator Scapulae      Rhomboid Major/Minor      Sternoclavicular joint      Acromioclavicular joint      Coracoid process      Long head of biceps      Supraspinatus      Infraspinatus      Subscapularis      Teres Minor  Teres Major      Pectoralis Major   0  Pectoralis Minor   0  Anterior Deltoid   1  Lateral Deltoid   1  Posterior Deltoid   1  Latissimus Dorsi      Sternocleidomastoid      (Blank rows = not tested) Graded on 0-4 scale (0 = no pain, 1 = pain, 2 = pain with wincing/grimacing/flinching, 3 = pain with withdrawal, 4 = unwilling to allow palpation), (Blank rows = not tested)     Accessory Motions/Glides Deferred       TODAY'S TREATMENT      Manual Therapy - for symptom modulation, soft tissue sensitivity and mobility, shoulder ROM   R shoulder PROM flexion, ER, IR hand to abdomen, and abd within pt tolerance (no aggressive overpressure/stretching); x 10 minutes STM R middle and anterior deltoid, biceps; x 3 minutes   *not today* MHP (unbilled) utilized following manual therapy for analgesic effect and improved soft tissue extensibility; along R anterior and anterior arm/elbow x 5 minutes with pt in supine lying.    Therapeutic Exercise - for shoulder complex ROM as needed for functional reaching and household chores;     Sidelying  shoulder abduction, ROM as tolerated today; L sidelying; 2x10    Standing bilateral shoulder flexion AROM, ROM as tolerated; 2x8, utilized mirror feedback to limit scapular hiking    Standing bilateral shoulder abduction AROM, ROM as tolerated; 2x10, utilized mirror feedback to limit scapular hiking    Wall ball circles, CW/CCW; 2x20 ea dir with shoulder at 90 deg forward elevation    Standing Tband row; Green Tband; x20      *next visit*  Shoulder flexion AROM with lawn chair progression; bed raised to 60 deg; 2x10 in each position    *not today* Shoulder isometrics; flexion, ER; x15, 5 sec hold (perform abduction next visit) Wall wash for forward flexion; 2x10 Finger ladder, forward elevation; x10 Supine; Wand flexion AAROM; 1x10 - verbal cueing for safe ROM, forward elevation to 115 Supine; Wand ER AAROM; to 45 deg only; 2x10  Shoulder ER with elbow on tabletop, in sitting; x20 Pulley, forward elevation; x 2 minutes         PATIENT EDUCATION:  Education details: Plan of care, HEP Person educated: Patient Education method: Explanation Education comprehension: verbalized understanding     HOME EXERCISE PROGRAM: Exercises - Supine Shoulder Flexion AAROM with dowel (up to 90 deg only); 2x10, 2x/day -Gripping, theraputty; 2-3 min; 2x/day -Pt continuing HEP given from referring clinic (elbow AROM, pendulums, wrist and digit AROM)       ASSESSMENT:   CLINICAL IMPRESSION: Patient is able to progress with forward elevation ROM and abduction ROM without significant discomfort. Pt still exhibits remaining AROM deficits and pt requires heavy cueing to limit scapular hiking compensation with shoulder elevation. Post-operative shoulder fortunately is not significantly painful at this time. Pt has remaining deficits in AROM, posture, strength, scapulohumeral rhythm, and functional RUE use. Patient will benefit from continued skilled therapeutic intervention to address the above  deficits as needed for improved function and QoL.    REHAB POTENTIAL: Good   CLINICAL DECISION MAKING: Stable/uncomplicated   EVALUATION COMPLEXITY: Low     GOALS: Goals reviewed with patient? Yes   SHORT TERM GOALS: Target date: 12/15/2022   Pt will be independent with HEP to improve strength and decrease neck pain to improve pain-free function at home and work. Baseline: 11/16/22: Baseline HEP initiated, HEP previously given from referring office  reviewed.   Goal status: INITIAL     LONG TERM GOALS: Target date: 01/12/2023   Pt will increase FOTO to at least 53 to demonstrate significant improvement in function at home and work related to neck pain  Baseline: 11/16/22: FOTO 29.   Goal status: INITIAL   2.  Pt will decrease worst shoulder pain by at least 3 points on the NPRS in order to demonstrate clinically significant reduction in shoulder pain. Baseline: 11/16/22: 8-9/10 at worst Goal status: INITIAL   3.   Patient will have shoulder flexion AROM to at least 130 deg as needed for functional reaching, self-care activities, household chores Baseline: 11/16/22: R shoulder flexion AROM not tested due to post-op status, flexion PROM to 90 deg (up to current restriction).    Goal status: INITIAL   4. Pt will increase strength of tested shoulder musculature to at least 4/5 or greater MMT grade in order to demonstrate improvement in strength and function as needed for light to moderate lifting and household chores          Baseline: 11/16/22: MMT 2-/5 globally given decreased shoulder ROM.  Goal status: INITIAL     PLAN: PT FREQUENCY: 2x/week   PT DURATION: 8 weeks   PLANNED INTERVENTIONS: Therapeutic exercises, Therapeutic activity, Neuromuscular re-education, Balance training, Gait training, Patient/Family education, Joint manipulation, Joint mobilization, Dry Needling, Electrical stimulation, Cryotherapy, Moist heat, Traction, Ultrasound, Ionotophoresis '4mg'$ /ml  Dexamethasone, and Manual therapy   PLAN FOR NEXT SESSION: Gentle R shoulder PROM and AROM as tolerated by patient. Continue with isometrics and gentle periscapular isotonics without R shoulder hyperextension.      Valentina Gu, PT, DPT #[id number]  Eilleen Kempf, PT 12/16/2022, 10:45 AM

## 2022-12-23 ENCOUNTER — Encounter: Payer: Medicare Other | Admitting: Physical Therapy

## 2022-12-28 ENCOUNTER — Ambulatory Visit: Payer: Medicare Other | Attending: Orthopedic Surgery | Admitting: Physical Therapy

## 2022-12-28 ENCOUNTER — Telehealth: Payer: Self-pay | Admitting: Pharmacist

## 2022-12-28 DIAGNOSIS — M6281 Muscle weakness (generalized): Secondary | ICD-10-CM | POA: Diagnosis not present

## 2022-12-28 DIAGNOSIS — M25611 Stiffness of right shoulder, not elsewhere classified: Secondary | ICD-10-CM | POA: Insufficient documentation

## 2022-12-28 DIAGNOSIS — M25511 Pain in right shoulder: Secondary | ICD-10-CM | POA: Insufficient documentation

## 2022-12-28 DIAGNOSIS — G8929 Other chronic pain: Secondary | ICD-10-CM | POA: Diagnosis not present

## 2022-12-28 NOTE — Telephone Encounter (Signed)
Received fax from [facility] that it is time to renew [employer] for [id number].  Please contact patient and see if she has already started the online renewal, or if she would like [place] to start the paperwork for her.

## 2022-12-28 NOTE — Therapy (Unsigned)
OUTPATIENT PHYSICAL THERAPY TREATMENT AND PROGRESS NOTE   Dates of reporting period  11/16/22   to   12/28/22    Patient Name: Renee [name] MRN: [id number] DOB:[birth year], 77 y.o., female Today's Date: 12/29/2022  PCP: Jinny Sanders, MD  REFERRING PROVIDER: Jenetta Loges, PA-C   END OF SESSION:   PT End of Session - 12/28/22 1432     Visit Number 10    Number of Visits 17    Date for PT Re-Evaluation 01/13/23    Authorization Type Medicare 2023    Progress Note Due on Visit 10    PT Start Time 1430    PT Stop Time 1516    PT Time Calculation (min) 46 min    Activity Tolerance No increased pain    Behavior During Therapy WFL for tasks assessed/performed                Past Medical History:  Diagnosis Date   Allergy    Anxiety    Arthritis    CAD (coronary artery disease)    non-obstructive. cath 11/11   Claustrophobia    Diabetes mellitus    borderline diabetic - diet controlled    GERD (gastroesophageal reflux disease)    History of chicken pox    History of phobia    clastrophobia   Hyperlipidemia    Hypertension    Hypothyroidism    Kidney stones    hx of    Lichen sclerosus    Psoriasis    Stroke ([place])    Urinary tract infection    hx of    Past Surgical History:  Procedure Laterality Date   APPENDECTOMY     CARDIAC CATHETERIZATION     2011   CARPAL TUNNEL RELEASE     Left trigger finger   CATARACT EXTRACTION W/PHACO Left 06/06/2018   Procedure: CATARACT EXTRACTION PHACO AND INTRAOCULAR LENS PLACEMENT ([place]);  Surgeon: Birder Robson, MD;  Location: [facility] ORS;  Service: Ophthalmology;  Laterality: Left;  [place]  00:23 AP% 16.0 CDE 3.69 Fluid pack lot # [id number]   CATARACT EXTRACTION W/PHACO Right 09/19/2018   Procedure: CATARACT EXTRACTION PHACO AND INTRAOCULAR LENS PLACEMENT ([facility]);  Surgeon: Birder Robson, MD;  Location: [facility];  Service: Ophthalmology;  Laterality: Right;  [place] 00:29.5 AP% 17.0 CDE$ 5.03 Fluid pack lot # [id number]    COLONOSCOPY WITH PROPOFOL N/A 05/13/2017   Procedure: COLONOSCOPY WITH PROPOFOL;  Surgeon: Manya Silvas, MD;  Location: [facility] ENDOSCOPY;  Service: Endoscopy;  Laterality: N/A;   ESOPHAGOGASTRODUODENOSCOPY (EGD) WITH PROPOFOL N/A 01/04/2020   Procedure: ESOPHAGOGASTRODUODENOSCOPY (EGD) WITH PROPOFOL;  Surgeon: [name], MD;  Location: [facility] ENDOSCOPY;  Service: Gastroenterology;  Laterality: N/A;   EYE SURGERY  2004   lasik - right eye    JOINT REPLACEMENT     KNEE SURGERY Right 2015   arthroscopy   REVERSE SHOULDER ARTHROPLASTY Right 10/21/2022   Procedure: REVERSE SHOULDER ARTHROPLASTY;  Surgeon: Justice Britain, MD;  Location: [facility]S;  Service: Orthopedics;  Laterality: Right;  130mn   TOTAL KNEE ARTHROPLASTY Bilateral 06/12/2015   Procedure: TOTAL KNEE BILATERAL;  Surgeon: FGaynelle Arabian MD;  Location: [facility];  Service: Orthopedics;  Laterality: Bilateral;  and epidural   TUBAL LIGATION     Tubes tied     and untied. and tied again   Patient Active Problem List   Diagnosis Date Noted   Pain in joint of left shoulder 11/03/2022   Statin myopathy 06/04/2022   Hordeolum externum of left  upper eyelid 02/17/2022   Acute gout involving toe of right foot 08/12/2021   Chronic pain of both shoulders 08/12/2021   History of TIA (transient ischemic attack) 06/26/2021   Statin intolerance 01/15/2021   MDD (major depressive disorder), recurrent episode, moderate (HCC) 07/08/2020   Chronic insomnia 07/08/2020   Chronic neck pain 10/05/2019   Chronic diarrhea 03/30/2018   GAD (generalized anxiety disorder) 01/31/2018   Osteopenia 12/08/2017   Vitamin D deficiency 03/04/2016   Allergic rhinitis 03/04/2016   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)  ([place]) 10/20/2015   External hemorrhoid 10/20/2015   Psoriasis 10/20/2015   History of bilateral knee replacement 10/20/2015   Osteoarthritis of knee 11/25/2014   Hypertension associated with diabetes ([place]) 11/06/2010    CAD, NATIVE VESSEL 11/06/2010   Hyperlipidemia associated with type 2 diabetes mellitus ([place]) 01/25/2006   Adult hypothyroidism 11/15/2005    REFERRING DIAG: Z96.619 (ICD-10-CM) - Presence of unspecified artificial shoulder joint    THERAPY DIAG:  Acute pain of right shoulder  Stiffness of right shoulder, not elsewhere classified  Muscle weakness (generalized)  Rationale for Evaluation and Treatment Rehabilitation  PERTINENT HISTORY: Pt is 77 year old female s/p right reverse TSA 10/21/22. Patient reports performing home exercises given by [facility] including pendulums, elbow AROM, wrist AROM, and sliding hand on her lap. Patient reports more pain rTSA compared to bilat TKA. Pt has been released from using her sling; pt uses only for going out in public and walking outside. Pt denies significant post-operative complications.    Pain:  Pain Intensity: Present: 3-4/10, Best: 1-2/10, Worst: 8-9/10 Pain location: Lateral arm and down to R elbow Pain Quality: aching  Radiating: Yes , down to R elbow Numbness/Tingling: Yes, NT down to her R fingers, affecting all digits  Aggravating factors: trying to lie down, raising arm inadvertently Relieving factors: avoiding unnecessary R shoulder activity  History of prior shoulder or neck/shoulder injury, pain, surgery, or therapy: Yes, PT prior to rTSA  Falls: Has patient fallen in last 6 months? No, Number of falls: N/A Dominant hand: left Imaging: Yes , X-rays post-operatively demonstrated good healing, no hardware issues   Prior level of function: Independent Occupational demands: N/A Hobbies:  walking exercise regimen Red flags (personal history of cancer, chills/fever, night sweats, nausea, vomiting, unrelenting pain): Negative   Precautions: rTSA post-op precautions   Weight Bearing Restrictions: No   Living Environment Lives with: lives alone, children live in [place] close to her; 4 daughters, 2 sons Lives in:  House/apartment     Patient Goals: Return to walking exercise regimen, able to travel/drive as needed    PRECAUTIONS: PROM and AROM as tolerated weeks 8-12  Right Reverse TSA, DOS: 10/21/22    SUBJECTIVE:  SUBJECTIVE STATEMENT:  Patient reports comorbid L shoulder pain that she attributes to change in weather. Patient reports her R shoulder is getting better. She reports sleeping better with less R shoulder pain. She reports her L arm bothers her more right now. Patient reports 80-90% global rating of function for her R shoulder at this time. Patient reports she can reach better and tolerate ADLs better. Pt states she can't reach back with L arm at this time. Pt reports doing better with self-care e.g. washing hair.   PAIN:  Are you having pain? Yes, NPRS 2/10   OBJECTIVE: (objective measures completed at initial evaluation unless otherwise dated)   Patient Surveys  FOTO: 29, predicted outcome score of 60   Cognition Patient is oriented to person, place, and time.  Recent memory is intact.  Remote memory is intact.  Attention span and concentration are intact.  Expressive speech is intact.  Patient's fund of knowledge is within normal limits for educational level.                          Gross Musculoskeletal Assessment Tremor: None Bulk: Normal Tone: Normal     Posture Forward head, rounded shoulders, inc thoracic kyphosis       AROM         AROM (Normal range in degrees) AROM 11/17/2022 AROM 12/28/22    Right Left Right Left  Shoulder        Flexion Deferred Deferred 117 129  Extension Deferred Deferred    Abduction Deferred Deferred 105 143  External Rotation Deferred Deferred 52 76  Internal Rotation Deferred Deferred    Hands Behind Head Deferred Deferred T1 T3  Hands Behind  Back Deferred Deferred             Elbow        Flexion WNL WNL    Extension -15 WNL    Pronation WNL WNL    Supination WNL WNL    (* = pain; Blank rows = not tested)       LE MMT: MMT (out of 5) Right 11/17/2022 Left 11/17/2022 Right 12/28/22 Left 12/28/22           Shoulder     Flexion Deferred Deferred 4- 4+  Extension        Abduction Deferred Deferred 3+ 4  External rotation Deferred Deferred 4 4-  Internal rotation Deferred Deferred 3+ 4  Horizontal abduction        Horizontal adduction        Lower Trapezius        Rhomboids                 Elbow    Flexion Deferred Deferred 4+ 5  Extension Deferred Deferred 4+ 5  Pronation        Supination                 (* = pain; Blank rows = not tested)   Sensation Grossly intact to light touch bilateral UE as determined by testing dermatomes C2-T2. Proprioception and hot/cold testing deferred on this date.     Palpation   Location LEFT  RIGHT           Subocciptials      Cervical paraspinals      Upper Trapezius   1  Levator Scapulae      Rhomboid Major/Minor      Sternoclavicular joint      Acromioclavicular  joint      Coracoid process      Long head of biceps      Supraspinatus      Infraspinatus      Subscapularis      Teres Minor      Teres Major      Pectoralis Major   0  Pectoralis Minor   0  Anterior Deltoid   1  Lateral Deltoid   1  Posterior Deltoid   1  Latissimus Dorsi      Sternocleidomastoid      (Blank rows = not tested) Graded on 0-4 scale (0 = no pain, 1 = pain, 2 = pain with wincing/grimacing/flinching, 3 = pain with withdrawal, 4 = unwilling to allow palpation), (Blank rows = not tested)     Accessory Motions/Glides Deferred       TODAY'S TREATMENT      Manual Therapy - for symptom modulation, soft tissue sensitivity and mobility, shoulder ROM   R shoulder PROM flexion, ER, IR hand to abdomen, and abd within pt tolerance (no aggressive overpressure/stretching); x 10  minutes STM R middle and anterior deltoid, biceps; x 2 minutes   *not today* MHP (unbilled) utilized following manual therapy for analgesic effect and improved soft tissue extensibility; along R anterior and anterior arm/elbow x 5 minutes with pt in supine lying.    Therapeutic Exercise - for shoulder complex ROM as needed for functional reaching and household chores;      *GOAL UPDATE PERFORMED   Standing bilateral shoulder abduction AROM, ROM as tolerated; 2x10, utilized mirror feedback to limit scapular hiking    Wall slide; x10, 5 sec hold, shoulder flexion    Standing Tband row; Green Tband; x20      PATIENT EDUCATION: discussed current progress, continued POC. Updated and reviewed HEP.     *next visit*  Shoulder flexion AROM with lawn chair progression; bed raised to 60 deg; 2x10 in each position  Wall ball circles, CW/CCW; 2x20 ea dir with shoulder at 90 deg forward elevation   *not today* Sidelying shoulder abduction, ROM as tolerated today; L sidelying; 2x10  Standing bilateral shoulder flexion AROM, ROM as tolerated; 2x8, utilized mirror feedback to limit scapular hiking  Shoulder isometrics; flexion, ER; x15, 5 sec hold (perform abduction next visit) Wall wash for forward flexion; 2x10 Finger ladder, forward elevation; x10 Supine; Wand flexion AAROM; 1x10 - verbal cueing for safe ROM, forward elevation to 115 Supine; Wand ER AAROM; to 45 deg only; 2x10  Shoulder ER with elbow on tabletop, in sitting; x20 Pulley, forward elevation; x 2 minutes         PATIENT EDUCATION:  Education details: Plan of care, HEP Person educated: Patient Education method: Explanation Education comprehension: verbalized understanding     HOME EXERCISE PROGRAM: Exercises Access Code [email or web address] - Reclined Shoulder Elevation  - 2 x daily - 7 x weekly - 2 sets - 10 reps - Shoulder Abduction, standing - "[employer]"  - 2 x daily - 7 x weekly - 2 sets - 10 reps - Shoulder Flexion  Wall Slide with Towel  - 2 x daily - 7 x weekly - 2 sets - 10 reps - 5sec hold - Supine Shoulder Flexion AAROM with dowel; 2x10, 2x/day       ASSESSMENT:   CLINICAL IMPRESSION: Patient is limited with comorbid L shoulder pain and pt is awaiting rTSA for L side as well. Pt is making good progress with current post-operative rehab for R rTSA  with notably improved ROM and strength. Pt is able to use RUE as tolerated now for ADLs and self-care activities. Pt is limited in overhead mobility and functional IR as expected with altered mechanics of rTSA. Pt does have markedly improved RUE pain post-operatively. Pt has fortunately met her FOTO goal early, but she still has room for improvement with R shoulder strength, AROM, and NPRS. Pt has remaining deficits in AROM, posture, strength, scapulohumeral rhythm, and functional RUE use. Patient will benefit from continued skilled therapeutic intervention to address the above deficits as needed for improved function and QoL.    REHAB POTENTIAL: Good   CLINICAL DECISION MAKING: Stable/uncomplicated   EVALUATION COMPLEXITY: Low     GOALS: Goals reviewed with patient? Yes   SHORT TERM GOALS: Target date: 12/15/2022   Pt will be independent with HEP to improve strength and decrease neck pain to improve pain-free function at home and work. Baseline: 11/16/22: Baseline HEP initiated, HEP previously given from referring office reviewed.   12/28/22: Patient reports doing well with her HEP Goal status: ACHIEVED     LONG TERM GOALS: Target date: 01/12/2023   Pt will increase FOTO to at least 53 to demonstrate significant improvement in function at home and work related to neck pain  Baseline: 11/16/22: FOTO 29.     12/28/22:  59/53 Goal status: ACHIEVED   2.  Pt will decrease worst shoulder pain by at least 3 points on the NPRS in order to demonstrate clinically significant reduction in shoulder pain. Baseline: 11/16/22: 8-9/10 at worst   12/28/22: Up to 7-8/10  at worst  Goal status: ON-GOING    3.   Patient will have shoulder flexion AROM to at least 130 deg as needed for functional reaching, self-care activities, household chores Baseline: 11/16/22: R shoulder flexion AROM not tested due to post-op status, flexion PROM to 90 deg (up to current restriction).      12/28/22: R shoulder flexion AROM to 117 deg in standing.  Goal status: ON-GOING   4. Pt will increase strength of tested shoulder musculature to at least 4/5 or greater MMT grade in order to demonstrate improvement in strength and function as needed for light to moderate lifting and household chores          Baseline: 11/16/22: MMT 2-/5 globally given decreased shoulder ROM.      12/28/22: Ongoing deltoid and internal rotation weakness Goal status: ON-GOING     PLAN: PT FREQUENCY: 2x/week   PT DURATION: 4-6 weeks   PLANNED INTERVENTIONS: Therapeutic exercises, Therapeutic activity, Neuromuscular re-education, Balance training, Gait training, Patient/Family education, Joint manipulation, Joint mobilization, Dry Needling, Electrical stimulation, Cryotherapy, Moist heat, Traction, Ultrasound, Ionotophoresis 48m/ml Dexamethasone, and Manual therapy   PLAN FOR NEXT SESSION: Gentle R shoulder PROM and AROM as tolerated by patient. Continue with graded strengthening as tolerated and improving ability to complete reaching and overhead work.      JValentina Gu PT, DPT ##[id number] JEilleen Kempf PT 12/29/2022, 1:06 PM

## 2022-12-29 ENCOUNTER — Encounter: Payer: Self-pay | Admitting: Physical Therapy

## 2022-12-29 NOTE — Telephone Encounter (Cosign Needed)
Called patient; no answer; left message.  Lindsey Foltanski, CPP notified  Addysen Louth, RMA Clinical Pharmacy Assistant [phone]   

## 2022-12-30 ENCOUNTER — Ambulatory Visit: Payer: Medicare Other | Admitting: Physical Therapy

## 2022-12-30 DIAGNOSIS — M25611 Stiffness of right shoulder, not elsewhere classified: Secondary | ICD-10-CM | POA: Diagnosis not present

## 2022-12-30 DIAGNOSIS — M6281 Muscle weakness (generalized): Secondary | ICD-10-CM

## 2022-12-30 DIAGNOSIS — M25511 Pain in right shoulder: Secondary | ICD-10-CM | POA: Diagnosis not present

## 2022-12-30 DIAGNOSIS — G8929 Other chronic pain: Secondary | ICD-10-CM | POA: Diagnosis not present

## 2022-12-30 NOTE — Therapy (Signed)
OUTPATIENT PHYSICAL THERAPY TREATMENT   Patient Name: MOSETTA FERDINAND MRN: [id number] DOB:[birth year], 77 y.o., female Today's Date: 12/30/2022  PCP: Jinny Sanders, MD  REFERRING PROVIDER: Jenetta Loges, PA-C   END OF SESSION:   PT End of Session - 12/30/22 1449     Visit Number 11    Number of Visits 17    Date for PT Re-Evaluation 01/13/23    Authorization Type Medicare 2023    Progress Note Due on Visit 10    PT Start Time 1429    PT Stop Time 1512    PT Time Calculation (min) 43 min    Activity Tolerance No increased pain    Behavior During Therapy WFL for tasks assessed/performed                 Past Medical History:  Diagnosis Date   Allergy    Anxiety    Arthritis    CAD (coronary artery disease)    non-obstructive. cath 11/11   Claustrophobia    Diabetes mellitus    borderline diabetic - diet controlled    GERD (gastroesophageal reflux disease)    History of chicken pox    History of phobia    clastrophobia   Hyperlipidemia    Hypertension    Hypothyroidism    Kidney stones    hx of    Lichen sclerosus    Psoriasis    Stroke ([place])    Urinary tract infection    hx of    Past Surgical History:  Procedure Laterality Date   APPENDECTOMY     CARDIAC CATHETERIZATION     2011   CARPAL TUNNEL RELEASE     Left trigger finger   CATARACT EXTRACTION W/PHACO Left 06/06/2018   Procedure: CATARACT EXTRACTION PHACO AND INTRAOCULAR LENS PLACEMENT ([place]);  Surgeon: Birder Robson, MD;  Location: [facility] ORS;  Service: Ophthalmology;  Laterality: Left;  [place]  00:23 AP% 16.0 CDE 3.69 Fluid pack lot # [id number]   CATARACT EXTRACTION W/PHACO Right 09/19/2018   Procedure: CATARACT EXTRACTION PHACO AND INTRAOCULAR LENS PLACEMENT ([facility]);  Surgeon: Birder Robson, MD;  Location: [facility];  Service: Ophthalmology;  Laterality: Right;  [place] 00:29.5 AP% 17.0 CDE$ 5.03 Fluid pack lot # [id number]   COLONOSCOPY WITH PROPOFOL N/A 05/13/2017   Procedure: COLONOSCOPY WITH  PROPOFOL;  Surgeon: Manya Silvas, MD;  Location: [facility] ENDOSCOPY;  Service: Endoscopy;  Laterality: N/A;   ESOPHAGOGASTRODUODENOSCOPY (EGD) WITH PROPOFOL N/A 01/04/2020   Procedure: ESOPHAGOGASTRODUODENOSCOPY (EGD) WITH PROPOFOL;  Surgeon: [name], MD;  Location: [facility] ENDOSCOPY;  Service: Gastroenterology;  Laterality: N/A;   EYE SURGERY  2004   lasik - right eye    JOINT REPLACEMENT     KNEE SURGERY Right 2015   arthroscopy   REVERSE SHOULDER ARTHROPLASTY Right 10/21/2022   Procedure: REVERSE SHOULDER ARTHROPLASTY;  Surgeon: Justice Britain, MD;  Location: [facility] ORS;  Service: Orthopedics;  Laterality: Right;  135mn   TOTAL KNEE ARTHROPLASTY Bilateral 06/12/2015   Procedure: TOTAL KNEE BILATERAL;  Surgeon: FGaynelle Arabian MD;  Location: [facility];  Service: Orthopedics;  Laterality: Bilateral;  and epidural   TUBAL LIGATION     Tubes tied     and untied. and tied again   Patient Active Problem List   Diagnosis Date Noted   Pain in joint of left shoulder 11/03/2022   Statin myopathy 06/04/2022   Hordeolum externum of left upper eyelid 02/17/2022   Acute gout involving toe of right foot 08/12/2021   Chronic pain  of both shoulders 08/12/2021   History of TIA (transient ischemic attack) 06/26/2021   Statin intolerance 01/15/2021   MDD (major depressive disorder), recurrent episode, moderate (HCC) 07/08/2020   Chronic insomnia 07/08/2020   Chronic neck pain 10/05/2019   Chronic diarrhea 03/30/2018   GAD (generalized anxiety disorder) 01/31/2018   Osteopenia 12/08/2017   Vitamin D deficiency 03/04/2016   Allergic rhinitis 03/04/2016   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)  ([place]) 10/20/2015   External hemorrhoid 10/20/2015   Psoriasis 10/20/2015   History of bilateral knee replacement 10/20/2015   Osteoarthritis of knee 11/25/2014   Hypertension associated with diabetes ([place]) 11/06/2010   CAD, NATIVE VESSEL 11/06/2010   Hyperlipidemia associated with type  2 diabetes mellitus ([place]) 01/25/2006   Adult hypothyroidism 11/15/2005    REFERRING DIAG: Z96.619 (ICD-10-CM) - Presence of unspecified artificial shoulder joint    THERAPY DIAG:  Acute pain of right shoulder  Stiffness of right shoulder, not elsewhere classified  Muscle weakness (generalized)  Rationale for Evaluation and Treatment Rehabilitation  PERTINENT HISTORY: Pt is 77 year old female s/p right reverse TSA 10/21/22. Patient reports performing home exercises given by [facility] health including pendulums, elbow AROM, wrist AROM, and sliding hand on her lap. Patient reports more pain rTSA compared to bilat TKA. Pt has been released from using her sling; pt uses only for going out in public and walking outside. Pt denies significant post-operative complications.    Pain:  Pain Intensity: Present: 3-4/10, Best: 1-2/10, Worst: 8-9/10 Pain location: Lateral arm and down to R elbow Pain Quality: aching  Radiating: Yes , down to R elbow Numbness/Tingling: Yes, NT down to her R fingers, affecting all digits  Aggravating factors: trying to lie down, raising arm inadvertently Relieving factors: avoiding unnecessary R shoulder activity  History of prior shoulder or neck/shoulder injury, pain, surgery, or therapy: Yes, PT prior to rTSA  Falls: Has patient fallen in last 6 months? No, Number of falls: N/A Dominant hand: left Imaging: Yes , X-rays post-operatively demonstrated good healing, no hardware issues   Prior level of function: Independent Occupational demands: N/A Hobbies:  walking exercise regimen Red flags (personal history of cancer, chills/fever, night sweats, nausea, vomiting, unrelenting pain): Negative   Precautions: rTSA post-op precautions   Weight Bearing Restrictions: No   Living Environment Lives with: lives alone, children live in [place] close to her; 4 daughters, 2 sons Lives in: House/apartment     Patient Goals: Return to walking exercise regimen, able to  travel/drive as needed    PRECAUTIONS: PROM and AROM as tolerated weeks 8-12  Right Reverse TSA, DOS: 10/21/22    SUBJECTIVE:  SUBJECTIVE STATEMENT:  Patient reports no major changes or issues since last visit. Pt reports compliance with updated HEP.   PAIN:  Are you having pain? No   OBJECTIVE: (objective measures completed at initial evaluation unless otherwise dated)   Patient Surveys  FOTO: 52, predicted outcome score of 84   Cognition Patient is oriented to person, place, and time.  Recent memory is intact.  Remote memory is intact.  Attention span and concentration are intact.  Expressive speech is intact.  Patient's fund of knowledge is within normal limits for educational level.                          Gross Musculoskeletal Assessment Tremor: None Bulk: Normal Tone: Normal     Posture Forward head, rounded shoulders, inc thoracic kyphosis       AROM         AROM (Normal range in degrees) AROM 11/17/2022 AROM 12/28/22    Right Left Right Left  Shoulder        Flexion Deferred Deferred 117 129  Extension Deferred Deferred    Abduction Deferred Deferred 105 143  External Rotation Deferred Deferred 52 76  Internal Rotation Deferred Deferred    Hands Behind Head Deferred Deferred T1 T3  Hands Behind Back Deferred Deferred             Elbow        Flexion WNL WNL    Extension -15 WNL    Pronation WNL WNL    Supination WNL WNL    (* = pain; Blank rows = not tested)       LE MMT: MMT (out of 5) Right 11/17/2022 Left 11/17/2022 Right 12/28/22 Left 12/28/22           Shoulder     Flexion Deferred Deferred 4- 4+  Extension        Abduction Deferred Deferred 3+ 4  External rotation Deferred Deferred 4 4-  Internal rotation Deferred Deferred 3+ 4  Horizontal abduction         Horizontal adduction        Lower Trapezius        Rhomboids                 Elbow    Flexion Deferred Deferred 4+ 5  Extension Deferred Deferred 4+ 5  Pronation        Supination                 (* = pain; Blank rows = not tested)   Sensation Grossly intact to light touch bilateral UE as determined by testing dermatomes C2-T2. Proprioception and hot/cold testing deferred on this date.     Palpation   Location LEFT  RIGHT           Subocciptials      Cervical paraspinals      Upper Trapezius   1  Levator Scapulae      Rhomboid Major/Minor      Sternoclavicular joint      Acromioclavicular joint      Coracoid process      Long head of biceps      Supraspinatus      Infraspinatus      Subscapularis      Teres Minor      Teres Major      Pectoralis Major   0  Pectoralis Minor   0  Anterior Deltoid   1  Lateral Deltoid   1  Posterior Deltoid   1  Latissimus Dorsi      Sternocleidomastoid      (Blank rows = not tested) Graded on 0-4 scale (0 = no pain, 1 = pain, 2 = pain with wincing/grimacing/flinching, 3 = pain with withdrawal, 4 = unwilling to allow palpation), (Blank rows = not tested)     Accessory Motions/Glides Deferred       TODAY'S TREATMENT      Manual Therapy - for symptom modulation, soft tissue sensitivity and mobility, shoulder ROM   R shoulder PROM flexion, ER, IR hand to abdomen, and abd within pt tolerance (no aggressive overpressure/stretching); x 10 minutes   *not today* STM R middle and anterior deltoid, biceps; x 2 minutes MHP (unbilled) utilized following manual therapy for analgesic effect and improved soft tissue extensibility; along R anterior and anterior arm/elbow x 5 minutes with pt in supine lying.    Therapeutic Exercise - for shoulder complex ROM as needed for functional reaching and household chores;     Supine circles with Dbell; 2-lb, x20 CW/CCW   Supine ABCs with 2-lb Dbell; x 2 minutes   Shoulder flexion AROM  with lawn chair progression; bed raised to 60 deg; 2x10 in each position    -WFL ROM demonstrated   Wall ball circles, CW/CCW; 2x20 ea dir with shoulder at 90 deg forward elevation  Finger ladder, forward elevation; x10, 5 sec hold at top   Standing bilateral shoulder abduction AROM, ROM as tolerated; 2x10, utilized mirror feedback to limit scapular hiking     Standing Tband row; Green Tband; x20     Standing ER/IR with Tband   -heavy tactile and verbal cueing for technique, PT demonstration for carryover of technique   PATIENT EDUCATION: discussed current progress, continued POC. Updated and reviewed HEP.      *not today*  Wall slide; x10, 5 sec hold, shoulder flexion Sidelying shoulder abduction, ROM as tolerated today; L sidelying; 2x10  Standing bilateral shoulder flexion AROM, ROM as tolerated; 2x8, utilized mirror feedback to limit scapular hiking  Shoulder isometrics; flexion, ER; x15, 5 sec hold (perform abduction next visit) Wall wash for forward flexion; 2x10  Supine; Wand flexion AAROM; 1x10 - verbal cueing for safe ROM, forward elevation to 115 Supine; Wand ER AAROM; to 45 deg only; 2x10  Shoulder ER with elbow on tabletop, in sitting; x20 Pulley, forward elevation; x 2 minutes         PATIENT EDUCATION:  Education details: Plan of care, HEP Person educated: Patient Education method: Explanation Education comprehension: verbalized understanding     HOME EXERCISE PROGRAM: Exercises Access Code [email or web address] - Reclined Shoulder Elevation  - 2 x daily - 7 x weekly - 2 sets - 10 reps - Shoulder Abduction, standing - "[employer]"  - 2 x daily - 7 x weekly - 2 sets - 10 reps - Shoulder Flexion Wall Slide with Towel  - 2 x daily - 7 x weekly - 2 sets - 10 reps - 5sec hold - Supine Shoulder Flexion AAROM with dowel; 2x10, 2x/day       ASSESSMENT:   CLINICAL IMPRESSION: Patient is able to better access R shoulder AROM in upright position. She is able to elevate  shoulder up to limits of rTSA in reclined position today  (60 deg angle) without assistance. She has minimal R shoulder pain and is improving with self-care ADLs and reaching tasks. She demonstrates improving ability to perform reaching beyond shoulder height  in upright standing position. Pt has remaining deficits in AROM, posture, strength, scapulohumeral rhythm, and functional RUE use. Patient will benefit from continued skilled therapeutic intervention to address the above deficits as needed for improved function and QoL.    REHAB POTENTIAL: Good   CLINICAL DECISION MAKING: Stable/uncomplicated   EVALUATION COMPLEXITY: Low     GOALS: Goals reviewed with patient? Yes   SHORT TERM GOALS: Target date: 12/15/2022   Pt will be independent with HEP to improve strength and decrease neck pain to improve pain-free function at home and work. Baseline: 11/16/22: Baseline HEP initiated, HEP previously given from referring office reviewed.   12/28/22: Patient reports doing well with her HEP Goal status: ACHIEVED     LONG TERM GOALS: Target date: 01/12/2023   Pt will increase FOTO to at least 53 to demonstrate significant improvement in function at home and work related to neck pain  Baseline: 11/16/22: FOTO 29.     12/28/22:  59/53 Goal status: ACHIEVED   2.  Pt will decrease worst shoulder pain by at least 3 points on the NPRS in order to demonstrate clinically significant reduction in shoulder pain. Baseline: 11/16/22: 8-9/10 at worst   12/28/22: Up to 7-8/10 at worst  Goal status: ON-GOING    3.   Patient will have shoulder flexion AROM to at least 130 deg as needed for functional reaching, self-care activities, household chores Baseline: 11/16/22: R shoulder flexion AROM not tested due to post-op status, flexion PROM to 90 deg (up to current restriction).      12/28/22: R shoulder flexion AROM to 117 deg in standing.  Goal status: ON-GOING   4. Pt will increase strength of tested shoulder  musculature to at least 4/5 or greater MMT grade in order to demonstrate improvement in strength and function as needed for light to moderate lifting and household chores          Baseline: 11/16/22: MMT 2-/5 globally given decreased shoulder ROM.      12/28/22: Ongoing deltoid and internal rotation weakness Goal status: ON-GOING     PLAN: PT FREQUENCY: 2x/week   PT DURATION: 4-6 weeks   PLANNED INTERVENTIONS: Therapeutic exercises, Therapeutic activity, Neuromuscular re-education, Balance training, Gait training, Patient/Family education, Joint manipulation, Joint mobilization, Dry Needling, Electrical stimulation, Cryotherapy, Moist heat, Traction, Ultrasound, Ionotophoresis '4mg'$ /ml Dexamethasone, and Manual therapy   PLAN FOR NEXT SESSION: Gentle R shoulder PROM and AROM as tolerated by patient. Continue with graded strengthening as tolerated and improving ability to complete reaching and overhead work.      Valentina Gu, PT, DPT #[id number]  Eilleen Kempf, PT 12/30/2022, 2:49 PM

## 2022-12-31 ENCOUNTER — Other Ambulatory Visit: Payer: Self-pay | Admitting: Family Medicine

## 2022-12-31 NOTE — Telephone Encounter (Signed)
Patient returned call to office,she stated that she would like for you all to complete the paper work for her . She said that if theres anything she has to sign after the paperwork is completed,please let her know.

## 2023-01-04 ENCOUNTER — Ambulatory Visit: Payer: Medicare Other | Admitting: Physical Therapy

## 2023-01-04 ENCOUNTER — Encounter: Payer: Self-pay | Admitting: Physical Therapy

## 2023-01-04 DIAGNOSIS — M25511 Pain in right shoulder: Secondary | ICD-10-CM

## 2023-01-04 DIAGNOSIS — M25611 Stiffness of right shoulder, not elsewhere classified: Secondary | ICD-10-CM

## 2023-01-04 DIAGNOSIS — M6281 Muscle weakness (generalized): Secondary | ICD-10-CM

## 2023-01-04 DIAGNOSIS — G8929 Other chronic pain: Secondary | ICD-10-CM | POA: Diagnosis not present

## 2023-01-04 NOTE — Therapy (Unsigned)
OUTPATIENT PHYSICAL THERAPY TREATMENT   Patient Name: Renee [name] MRN: [id number] DOB:1946-08-12, 77 y.o., female Today's Date: 01/04/2023  PCP: Jinny Sanders, MD  REFERRING PROVIDER: Jenetta Loges, PA-C   END OF SESSION:   PT End of Session - 01/04/23 1437     Visit Number 12    Number of Visits 17    Date for PT Re-Evaluation 01/13/23    Authorization Type Medicare 2023    Progress Note Due on Visit 10    PT Start Time 1431    PT Stop Time 1513    PT Time Calculation (min) 42 min    Activity Tolerance No increased pain    Behavior During Therapy WFL for tasks assessed/performed              Past Medical History:  Diagnosis Date   Allergy    Anxiety    Arthritis    CAD (coronary artery disease)    non-obstructive. cath 11/11   Claustrophobia    Diabetes mellitus    borderline diabetic - diet controlled    GERD (gastroesophageal reflux disease)    History of chicken pox    History of phobia    clastrophobia   Hyperlipidemia    Hypertension    Hypothyroidism    Kidney stones    hx of    Lichen sclerosus    Psoriasis    Stroke ([place])    Urinary tract infection    hx of    Past Surgical History:  Procedure Laterality Date   APPENDECTOMY     CARDIAC CATHETERIZATION     2011   CARPAL TUNNEL RELEASE     Left trigger finger   CATARACT EXTRACTION W/PHACO Left 06/06/2018   Procedure: CATARACT EXTRACTION PHACO AND INTRAOCULAR LENS PLACEMENT ([place]);  Surgeon: Birder Robson, MD;  Location: [facility] ORS;  Service: Ophthalmology;  Laterality: Left;  [place]  00:23 AP% 16.0 CDE 3.69 Fluid pack lot # [id number]   CATARACT EXTRACTION W/PHACO Right 09/19/2018   Procedure: CATARACT EXTRACTION PHACO AND INTRAOCULAR LENS PLACEMENT (IOC);  Surgeon: Birder Robson, MD;  Location: [facility];  Service: Ophthalmology;  Laterality: Right;  [place] 00:29.5 AP% 17.0 CDE$ 5.03 Fluid pack lot # [id number]   COLONOSCOPY WITH PROPOFOL N/A 05/13/2017   Procedure: COLONOSCOPY WITH PROPOFOL;   Surgeon: Manya Silvas, MD;  Location[facility] ENDOSCOPY;  Service: Endoscopy;  Laterality: N/A;   ESOPHAGOGASTRODUODENOSCOPY (EGD) WITH PROPOFOL N/A 01/04/2020   Procedure: ESOPHAGOGASTRODUODENOSCOPY (EGD) WITH PROPOFOL;  Surgeon: [name], MD;  Location: [facility] ENDOSCOPY;  Service: Gastroenterology;  Laterality: N/A;   EYE SURGERY  2004   lasik - right eye    JOINT REPLACEMENT     KNEE SURGERY Right 2015   arthroscopy   REVERSE SHOULDER ARTHROPLASTY Right 10/21/2022   Procedure: REVERSE SHOULDER ARTHROPLASTY;  Surgeon: Justice Britain, MD;  Location: [facility] ORS;  Service: Orthopedics;  Laterality: Right;  153mn   TOTAL KNEE ARTHROPLASTY Bilateral 06/12/2015   Procedure: TOTAL KNEE BILATERAL;  Surgeon: FGaynelle Arabian MD;  Location: [facility] ORS;  Service: Orthopedics;  Laterality: Bilateral;  and epidural   TUBAL LIGATION     Tubes tied     and untied. and tied again   Patient Active Problem List   Diagnosis Date Noted   Pain in joint of left shoulder 11/03/2022   Statin myopathy 06/04/2022   Hordeolum externum of left upper eyelid 02/17/2022   Acute gout involving toe of right foot 08/12/2021   Chronic pain of both shoulders  08/12/2021   History of TIA (transient ischemic attack) 06/26/2021   Statin intolerance 01/15/2021   MDD (major depressive disorder), recurrent episode, moderate (HCC) 07/08/2020   Chronic insomnia 07/08/2020   Chronic neck pain 10/05/2019   Chronic diarrhea 03/30/2018   GAD (generalized anxiety disorder) 01/31/2018   Osteopenia 12/08/2017   Vitamin D deficiency 03/04/2016   Allergic rhinitis 03/04/2016   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)  ([place]) 10/20/2015   External hemorrhoid 10/20/2015   Psoriasis 10/20/2015   History of bilateral knee replacement 10/20/2015   Osteoarthritis of knee 11/25/2014   Hypertension associated with diabetes ([place]) 11/06/2010   CAD, NATIVE VESSEL 11/06/2010   Hyperlipidemia associated with type 2 diabetes  mellitus ([place]) 01/25/2006   Adult hypothyroidism 11/15/2005    REFERRING DIAG: Z96.619 (ICD-10-CM) - Presence of unspecified artificial shoulder joint    THERAPY DIAG:  Acute pain of right shoulder  Stiffness of right shoulder, not elsewhere classified  Muscle weakness (generalized)  Rationale for Evaluation and Treatment Rehabilitation  PERTINENT HISTORY: Pt is 77 year old female s/p right reverse TSA 10/21/22. Patient reports performing home exercises given by [facility] health including pendulums, elbow AROM, wrist AROM, and sliding hand on her lap. Patient reports more pain rTSA compared to bilat TKA. Pt has been released from using her sling; pt uses only for going out in public and walking outside. Pt denies significant post-operative complications.    Pain:  Pain Intensity: Present: 3-4/10, Best: 1-2/10, Worst: 8-9/10 Pain location: Lateral arm and down to R elbow Pain Quality: aching  Radiating: Yes , down to R elbow Numbness/Tingling: Yes, NT down to her R fingers, affecting all digits  Aggravating factors: trying to lie down, raising arm inadvertently Relieving factors: avoiding unnecessary R shoulder activity  History of prior shoulder or neck/shoulder injury, pain, surgery, or therapy: Yes, PT prior to rTSA  Falls: Has patient fallen in last 6 months? No, Number of falls: N/A Dominant hand: left Imaging: Yes , X-rays post-operatively demonstrated good healing, no hardware issues   Prior level of function: Independent Occupational demands: N/A Hobbies:  walking exercise regimen Red flags (personal history of cancer, chills/fever, night sweats, nausea, vomiting, unrelenting pain): Negative   Precautions: rTSA post-op precautions   Weight Bearing Restrictions: No   Living Environment Lives with: lives alone, children live in [place] close to her; 4 daughters, 2 sons Lives in: House/apartment     Patient Goals: Return to walking exercise regimen, able to travel/drive  as needed    PRECAUTIONS: PROM and AROM as tolerated weeks 8-12  Right Reverse TSA, DOS: 10/21/22    SUBJECTIVE:  SUBJECTIVE STATEMENT:  Patient reports no major changes or issues since last visit. Pt reports compliance with updated HEP.   PAIN:  Are you having pain? No   OBJECTIVE: (objective measures completed at initial evaluation unless otherwise dated)   Patient Surveys  FOTO: 29, predicted outcome score of 31   Cognition Patient is oriented to person, place, and time.  Recent memory is intact.  Remote memory is intact.  Attention span and concentration are intact.  Expressive speech is intact.  Patient's fund of knowledge is within normal limits for educational level.                          Gross Musculoskeletal Assessment Tremor: None Bulk: Normal Tone: Normal     Posture Forward head, rounded shoulders, inc thoracic kyphosis       AROM         AROM (Normal range in degrees) AROM 11/17/2022 AROM 12/28/22    Right Left Right Left  Shoulder        Flexion Deferred Deferred 117 129  Extension Deferred Deferred    Abduction Deferred Deferred 105 143  External Rotation Deferred Deferred 52 76  Internal Rotation Deferred Deferred    Hands Behind Head Deferred Deferred T1 T3  Hands Behind Back Deferred Deferred             Elbow        Flexion WNL WNL    Extension -15 WNL    Pronation WNL WNL    Supination WNL WNL    (* = pain; Blank rows = not tested)       LE MMT: MMT (out of 5) Right 11/17/2022 Left 11/17/2022 Right 12/28/22 Left 12/28/22           Shoulder     Flexion Deferred Deferred 4- 4+  Extension        Abduction Deferred Deferred 3+ 4  External rotation Deferred Deferred 4 4-  Internal rotation Deferred Deferred 3+ 4  Horizontal abduction         Horizontal adduction        Lower Trapezius        Rhomboids                 Elbow    Flexion Deferred Deferred 4+ 5  Extension Deferred Deferred 4+ 5  Pronation        Supination                 (* = pain; Blank rows = not tested)   Sensation Grossly intact to light touch bilateral UE as determined by testing dermatomes C2-T2. Proprioception and hot/cold testing deferred on this date.     Palpation   Location LEFT  RIGHT           Subocciptials      Cervical paraspinals      Upper Trapezius   1  Levator Scapulae      Rhomboid Major/Minor      Sternoclavicular joint      Acromioclavicular joint      Coracoid process      Long head of biceps      Supraspinatus      Infraspinatus      Subscapularis      Teres Minor      Teres Major      Pectoralis Major   0  Pectoralis Minor   0  Anterior Deltoid   1  Lateral Deltoid   1  Posterior Deltoid   1  Latissimus Dorsi      Sternocleidomastoid      (Blank rows = not tested) Graded on 0-4 scale (0 = no pain, 1 = pain, 2 = pain with wincing/grimacing/flinching, 3 = pain with withdrawal, 4 = unwilling to allow palpation), (Blank rows = not tested)     Accessory Motions/Glides Deferred       TODAY'S TREATMENT      Manual Therapy - for symptom modulation, soft tissue sensitivity and mobility, shoulder ROM   R shoulder PROM flexion, ER, IR hand to abdomen, and abd within pt tolerance (no aggressive overpressure/stretching); x 10 minutes   *not today* STM R middle and anterior deltoid, biceps; x 2 minutes MHP (unbilled) utilized following manual therapy for analgesic effect and improved soft tissue extensibility; along R anterior and anterior arm/elbow x 5 minutes with pt in supine lying.    Therapeutic Exercise - for shoulder complex ROM as needed for functional reaching and household chores;     Supine circles with Dbell; 3-lb, x20 CW/CCW   Supine ABCs with 3-lb Dbell; x 3 minutes   Shoulder flexion AROM,  2x10, utilized mirror feedback to limit scapular hiking    Standing bilateral shoulder abduction AROM, ROM as tolerated; 2x10, utilized mirror feedback to limit scapular hiking     Wall ball circles, CW/CCW; 2x20 ea dir with shoulder at 90 deg forward elevation   Standing Tband row; Blue Tband; 2x10     Standing ER/IR with Green Tband; 2x10    -heavy tactile and verbal cueing for technique, PT demonstration for carryover of technique   PATIENT EDUCATION: discussed current progress, continued POC. Updated and reviewed HEP.      *not today*  Shoulder flexion AROM with lawn chair progression; bed raised to 60 deg; 2x10 in each position    -WFL ROM demonstrated Finger ladder, forward elevation; x10, 5 sec hold at top Wall slide; x10, 5 sec hold, shoulder flexion Sidelying shoulder abduction, ROM as tolerated today; L sidelying; 2x10  Standing bilateral shoulder flexion AROM, ROM as tolerated; 2x8, utilized mirror feedback to limit scapular hiking  Shoulder isometrics; flexion, ER; x15, 5 sec hold (perform abduction next visit) Wall wash for forward flexion; 2x10  Supine; Wand flexion AAROM; 1x10 - verbal cueing for safe ROM, forward elevation to 115 Supine; Wand ER AAROM; to 45 deg only; 2x10  Shoulder ER with elbow on tabletop, in sitting; x20 Pulley, forward elevation; x 2 minutes         PATIENT EDUCATION:  Education details: Plan of care, HEP Person educated: Patient Education method: Explanation Education comprehension: verbalized understanding     HOME EXERCISE PROGRAM: Exercises Access Code [email or web address] - Reclined Shoulder Elevation  - 2 x daily - 7 x weekly - 2 sets - 10 reps - Shoulder Abduction, standing - "[employer]"  - 2 x daily - 7 x weekly - 2 sets - 10 reps - Shoulder Flexion Wall Slide with Towel  - 2 x daily - 7 x weekly - 2 sets - 10 reps - 5sec hold - Supine Shoulder Flexion AAROM with dowel; 2x10, 2x/day       ASSESSMENT:   CLINICAL  IMPRESSION: Patient demonstrates improved ability to access active forward elevation in upright position. She does exhibit mild scapular elevation compensatory strategy intermittently that is readily corrected with tactile/verbal cueing. Patient is making good progress to date and has no significant R shoulder pain at this  time. Pt has remaining deficits in AROM, posture, strength, scapulohumeral rhythm, and functional RUE use. Patient will benefit from continued skilled therapeutic intervention to address the above deficits as needed for improved function and QoL.    REHAB POTENTIAL: Good   CLINICAL DECISION MAKING: Stable/uncomplicated   EVALUATION COMPLEXITY: Low     GOALS: Goals reviewed with patient? Yes   SHORT TERM GOALS: Target date: 12/15/2022   Pt will be independent with HEP to improve strength and decrease neck pain to improve pain-free function at home and work. Baseline: 11/16/22: Baseline HEP initiated, HEP previously given from referring office reviewed.   12/28/22: Patient reports doing well with her HEP Goal status: ACHIEVED     LONG TERM GOALS: Target date: 01/12/2023   Pt will increase FOTO to at least 53 to demonstrate significant improvement in function at home and work related to neck pain  Baseline: 11/16/22: FOTO 29.     12/28/22:  59/53 Goal status: ACHIEVED   2.  Pt will decrease worst shoulder pain by at least 3 points on the NPRS in order to demonstrate clinically significant reduction in shoulder pain. Baseline: 11/16/22: 8-9/10 at worst   12/28/22: Up to 7-8/10 at worst  Goal status: ON-GOING    3.   Patient will have shoulder flexion AROM to at least 130 deg as needed for functional reaching, self-care activities, household chores Baseline: 11/16/22: R shoulder flexion AROM not tested due to post-op status, flexion PROM to 90 deg (up to current restriction).      12/28/22: R shoulder flexion AROM to 117 deg in standing.  Goal status: ON-GOING   4. Pt will  increase strength of tested shoulder musculature to at least 4/5 or greater MMT grade in order to demonstrate improvement in strength and function as needed for light to moderate lifting and household chores          Baseline: 11/16/22: MMT 2-/5 globally given decreased shoulder ROM.      12/28/22: Ongoing deltoid and internal rotation weakness Goal status: ON-GOING     PLAN: PT FREQUENCY: 2x/week   PT DURATION: 4-6 weeks   PLANNED INTERVENTIONS: Therapeutic exercises, Therapeutic activity, Neuromuscular re-education, Balance training, Gait training, Patient/Family education, Joint manipulation, Joint mobilization, Dry Needling, Electrical stimulation, Cryotherapy, Moist heat, Traction, Ultrasound, Ionotophoresis '4mg'$ /ml Dexamethasone, and Manual therapy   PLAN FOR NEXT SESSION: Gentle R shoulder PROM and AROM as tolerated by patient. Continue with graded strengthening as tolerated and improving ability to complete reaching and overhead work.      Valentina Gu, PT, DPT #[id number]  Eilleen Kempf, PT 01/04/2023, 2:38 PM

## 2023-01-05 ENCOUNTER — Telehealth: Payer: Self-pay

## 2023-01-05 NOTE — Progress Notes (Addendum)
Patient is currently enrolled in [employer] patient assistance program for the medication Trulicity until date: 47/34/0370 Patient would like to re-enroll in patient assistance for the next calendar year.  Renewal forms were completed on behalf of the patient. Patient has requested to sign forms in the office. Forms will be printed and placed at front desk.  Patient has been made aware and will come to the office Monday, January 15 to sign the forms.   Marijean Niemann, CMA

## 2023-01-05 NOTE — Telephone Encounter (Cosign Needed)
Renewal forms were completed on behalf of the patient. Patient has requested to sign forms in the office. Forms will be printed and placed at front desk.   Patient has been made aware and will come to the office Monday, January 15 to sign the forms.    Renee [name], CMA

## 2023-01-05 NOTE — Telephone Encounter (Signed)
Forms printed and placed at front desk for patient signature.

## 2023-01-06 ENCOUNTER — Ambulatory Visit: Payer: Medicare Other | Admitting: Physical Therapy

## 2023-01-06 ENCOUNTER — Other Ambulatory Visit: Payer: Self-pay

## 2023-01-06 DIAGNOSIS — G8929 Other chronic pain: Secondary | ICD-10-CM | POA: Diagnosis not present

## 2023-01-06 DIAGNOSIS — M6281 Muscle weakness (generalized): Secondary | ICD-10-CM

## 2023-01-06 DIAGNOSIS — M25511 Pain in right shoulder: Secondary | ICD-10-CM | POA: Diagnosis not present

## 2023-01-06 DIAGNOSIS — M25611 Stiffness of right shoulder, not elsewhere classified: Secondary | ICD-10-CM

## 2023-01-06 NOTE — Telephone Encounter (Cosign Needed)
Patient has been made aware and will come to the office Monday, January 15 to sign the forms.   Charlene Brooke, PharmD notified  Marijean Niemann, [place] Clinical Pharmacy Assistant [phone]

## 2023-01-06 NOTE — Therapy (Signed)
OUTPATIENT PHYSICAL THERAPY TREATMENT   Patient Name: Renee [name] MRN: [id number] DOB:01-12-1946, 77 y.o., female Today's Date: 01/04/2023  PCP: Jinny Sanders, MD  REFERRING PROVIDER: Jenetta Loges, PA-C   END OF SESSION:   PT End of Session - 01/04/23 1437     Visit Number 12    Number of Visits 17    Date for PT Re-Evaluation 01/13/23    Authorization Type Medicare 2023    Progress Note Due on Visit 10    PT Start Time 1431    PT Stop Time 1513    PT Time Calculation (min) 42 min    Activity Tolerance No increased pain    Behavior During Therapy WFL for tasks assessed/performed              Past Medical History:  Diagnosis Date   Allergy    Anxiety    Arthritis    CAD (coronary artery disease)    non-obstructive. cath 11/11   Claustrophobia    Diabetes mellitus    borderline diabetic - diet controlled    GERD (gastroesophageal reflux disease)    History of chicken pox    History of phobia    clastrophobia   Hyperlipidemia    Hypertension    Hypothyroidism    Kidney stones    hx of    Lichen sclerosus    Psoriasis    Stroke ([place])    Urinary tract infection    hx of    Past Surgical History:  Procedure Laterality Date   APPENDECTOMY     CARDIAC CATHETERIZATION     2011   CARPAL TUNNEL RELEASE     Left trigger finger   CATARACT EXTRACTION W/PHACO Left 06/06/2018   Procedure: CATARACT EXTRACTION PHACO AND INTRAOCULAR LENS PLACEMENT ([place]);  Surgeon: Birder Robson, MD;  Location: [facility] ORS;  Service: Ophthalmology;  Laterality: Left;  [place]  00:23 AP% 16.0 CDE 3.69 Fluid pack lot # [id number]   CATARACT EXTRACTION W/PHACO Right 09/19/2018   Procedure: CATARACT EXTRACTION PHACO AND INTRAOCULAR LENS PLACEMENT ([facility]);  Surgeon: Birder Robson, MD;  Location: [facility];  Service: Ophthalmology;  Laterality: Right;  [place] 00:29.5 AP% 17.0 CDE$ 5.03 Fluid pack lot # [id number]   COLONOSCOPY WITH PROPOFOL N/A 05/13/2017   Procedure: COLONOSCOPY WITH PROPOFOL;   Surgeon: Manya Silvas, MD;  Location: [facility] ENDOSCOPY;  Service: Endoscopy;  Laterality: N/A;   ESOPHAGOGASTRODUODENOSCOPY (EGD) WITH PROPOFOL N/A 01/04/2020   Procedure: ESOPHAGOGASTRODUODENOSCOPY (EGD) WITH PROPOFOL;  Surgeon: [name], MD;  Location[facility] ENDOSCOPY;  Service: Gastroenterology;  Laterality: N/A;   EYE SURGERY  2004   lasik - right eye    JOINT REPLACEMENT     KNEE SURGERY Right 2015   arthroscopy   REVERSE SHOULDER ARTHROPLASTY Right 10/21/2022   Procedure: REVERSE SHOULDER ARTHROPLASTY;  Surgeon: Justice Britain, MD;  Location: [facility] ORS;  Service: Orthopedics;  Laterality: Right;  171mn   TOTAL KNEE ARTHROPLASTY Bilateral 06/12/2015   Procedure: TOTAL KNEE BILATERAL;  Surgeon: FGaynelle Arabian MD;  Location: [facility] ORS;  Service: Orthopedics;  Laterality: Bilateral;  and epidural   TUBAL LIGATION     Tubes tied     and untied. and tied again   Patient Active Problem List   Diagnosis Date Noted   Pain in joint of left shoulder 11/03/2022   Statin myopathy 06/04/2022   Hordeolum externum of left upper eyelid 02/17/2022   Acute gout involving toe of right foot 08/12/2021   Chronic pain of both shoulders  08/12/2021   History of TIA (transient ischemic attack) 06/26/2021   Statin intolerance 01/15/2021   MDD (major depressive disorder), recurrent episode, moderate (HCC) 07/08/2020   Chronic insomnia 07/08/2020   Chronic neck pain 10/05/2019   Chronic diarrhea 03/30/2018   GAD (generalized anxiety disorder) 01/31/2018   Osteopenia 12/08/2017   Vitamin D deficiency 03/04/2016   Allergic rhinitis 03/04/2016   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)  ([place]) 10/20/2015   External hemorrhoid 10/20/2015   Psoriasis 10/20/2015   History of bilateral knee replacement 10/20/2015   Osteoarthritis of knee 11/25/2014   Hypertension associated with diabetes ([place]) 11/06/2010   CAD, NATIVE VESSEL 11/06/2010   Hyperlipidemia associated with type 2 diabetes  mellitus ([place]) 01/25/2006   Adult hypothyroidism 11/15/2005    REFERRING DIAG: Z96.619 (ICD-10-CM) - Presence of unspecified artificial shoulder joint    THERAPY DIAG:  Acute pain of right shoulder  Stiffness of right shoulder, not elsewhere classified  Muscle weakness (generalized)  Rationale for Evaluation and Treatment Rehabilitation  PERTINENT HISTORY: Pt is 77 year old female s/p right reverse TSA 10/21/22. Patient reports performing home exercises given by [facility] including pendulums, elbow AROM, wrist AROM, and sliding hand on her lap. Patient reports more pain rTSA compared to bilat TKA. Pt has been released from using her sling; pt uses only for going out in public and walking outside. Pt denies significant post-operative complications.    Pain:  Pain Intensity: Present: 3-4/10, Best: 1-2/10, Worst: 8-9/10 Pain location: Lateral arm and down to R elbow Pain Quality: aching  Radiating: Yes , down to R elbow Numbness/Tingling: Yes, NT down to her R fingers, affecting all digits  Aggravating factors: trying to lie down, raising arm inadvertently Relieving factors: avoiding unnecessary R shoulder activity  History of prior shoulder or neck/shoulder injury, pain, surgery, or therapy: Yes, PT prior to rTSA  Falls: Has patient fallen in last 6 months? No, Number of falls: N/A Dominant hand: left Imaging: Yes , X-rays post-operatively demonstrated good healing, no hardware issues   Prior level of function: Independent Occupational demands: N/A Hobbies:  walking exercise regimen Red flags (personal history of cancer, chills/fever, night sweats, nausea, vomiting, unrelenting pain): Negative   Precautions: rTSA post-op precautions   Weight Bearing Restrictions: No   Living Environment Lives with: lives alone, children live in [place] close to her; 4 daughters, 2 sons Lives in: House/apartment     Patient Goals: Return to walking exercise regimen, able to travel/drive  as needed    PRECAUTIONS: PROM and AROM as tolerated weeks 8-12  Right Reverse TSA, DOS: 10/21/22    SUBJECTIVE:  SUBJECTIVE STATEMENT:  Patient reports no major changes or issues since last visit. Pt reports some irritation in L shoulder and is using a athritis patch for pain. Pt reports that they have had less difficulty with reaching into their higher cabinets.  PAIN:  Are you having pain? No   OBJECTIVE: (objective measures completed at initial evaluation unless otherwise dated)   Patient Surveys  FOTO: 5, predicted outcome score of 27   Cognition Patient is oriented to person, place, and time.  Recent memory is intact.  Remote memory is intact.  Attention span and concentration are intact.  Expressive speech is intact.  Patient's fund of knowledge is within normal limits for educational level.                          Gross Musculoskeletal Assessment Tremor: None Bulk: Normal Tone: Normal     Posture Forward head, rounded shoulders, inc thoracic kyphosis       AROM         AROM (Normal range in degrees) AROM 11/17/2022 AROM 12/28/22    Right Left Right Left  Shoulder        Flexion Deferred Deferred 117 129  Extension Deferred Deferred    Abduction Deferred Deferred 105 143  External Rotation Deferred Deferred 52 76  Internal Rotation Deferred Deferred    Hands Behind Head Deferred Deferred T1 T3  Hands Behind Back Deferred Deferred             Elbow        Flexion WNL WNL    Extension -15 WNL    Pronation WNL WNL    Supination WNL WNL    (* = pain; Blank rows = not tested)       LE MMT: MMT (out of 5) Right 11/17/2022 Left 11/17/2022 Right 12/28/22 Left 12/28/22           Shoulder     Flexion Deferred Deferred 4- 4+  Extension        Abduction Deferred Deferred 3+ 4   External rotation Deferred Deferred 4 4-  Internal rotation Deferred Deferred 3+ 4  Horizontal abduction        Horizontal adduction        Lower Trapezius        Rhomboids                 Elbow    Flexion Deferred Deferred 4+ 5  Extension Deferred Deferred 4+ 5  Pronation        Supination                 (* = pain; Blank rows = not tested)   Sensation Grossly intact to light touch bilateral UE as determined by testing dermatomes C2-T2. Proprioception and hot/cold testing deferred on this date.     Palpation   Location LEFT  RIGHT           Subocciptials      Cervical paraspinals      Upper Trapezius   1  Levator Scapulae      Rhomboid Major/Minor      Sternoclavicular joint      Acromioclavicular joint      Coracoid process      Long head of biceps      Supraspinatus      Infraspinatus      Subscapularis      Teres Minor      Teres Major  Pectoralis Major   0  Pectoralis Minor   0  Anterior Deltoid   1  Lateral Deltoid   1  Posterior Deltoid   1  Latissimus Dorsi      Sternocleidomastoid      (Blank rows = not tested) Graded on 0-4 scale (0 = no pain, 1 = pain, 2 = pain with wincing/grimacing/flinching, 3 = pain with withdrawal, 4 = unwilling to allow palpation), (Blank rows = not tested)     Accessory Motions/Glides Deferred       TODAY'S TREATMENT      Manual Therapy - for symptom modulation, soft tissue sensitivity and mobility, shoulder ROM   R shoulder PROM flexion, ER, IR hand to abdomen, and abd within pt tolerance (no aggressive overpressure/stretching); x 10 minutes   *not today* STM R middle and anterior deltoid, biceps; x 2 minutes MHP (unbilled) utilized following manual therapy for analgesic effect and improved soft tissue extensibility; along R anterior and anterior arm/elbow x 5 minutes with pt in supine lying.    Therapeutic Exercise - for shoulder complex ROM as needed for functional reaching and household chores;      Supine circles with Dbell; 3-lb, x20 CW/CCW   Supine ABCs with 3-lb Dbell; x 3 minutes   Shoulder bilateral flexion AROM, 2x10, utilized mirror feedback to limit scapular hiking    Standing bilateral shoulder abduction AROM, ROM as tolerated; 2x10, utilized mirror feedback to limit scapular hiking     Wall ball circles, CW/CCW; 2x20 ea dir with shoulder at 90 deg forward elevation   Wall wash for forward flexion; 2x10      Standing ER/IR with Green Tband; 2x10    -heavy tactile and verbal cueing for technique, PT demonstration for carryover of technique  Upper body ergometer; 2 minutes forward, 2 minutes backward; for improved soft tissue mobility with increased tissue temperature following warm-up   PATIENT EDUCATION: Education on appropriate exercise technique for ER/IR isotonics. Discussed current HEP and completing forward elevation without scapular hiking compensatory pattern     *not today*  Standing Tband row; Blue Tband; 2x10 Shoulder flexion AROM with lawn chair progression; bed raised to 60 deg; 2x10 in each position    -WFL ROM demonstrated Finger ladder, forward elevation; x10, 5 sec hold at top Wall slide; x10, 5 sec hold, shoulder flexion Sidelying shoulder abduction, ROM as tolerated today; L sidelying; 2x10  Shoulder isometrics; flexion, ER; x15, 5 sec hold (perform abduction next visit) Supine; Wand flexion AAROM; 1x10 - verbal cueing for safe ROM, forward elevation to 115 Supine; Wand ER AAROM; to 45 deg only; 2x10  Shoulder ER with elbow on tabletop, in sitting; x20 Pulley, forward elevation; x 2 minutes         PATIENT EDUCATION:  Education details: Plan of care, HEP Person educated: Patient Education method: Explanation Education comprehension: verbalized understanding     HOME EXERCISE PROGRAM: Exercises Access Code [email or web address] - Reclined Shoulder Elevation  - 2 x daily - 7 x weekly - 2 sets - 10 reps - Shoulder Abduction, standing - "[employer]"  - 2 x daily - 7 x weekly - 2 sets - 10 reps - Shoulder Flexion Wall Slide with Towel  - 2 x daily - 7 x weekly - 2 sets - 10 reps - 5sec hold - Supine Shoulder Flexion AAROM with dowel; 2x10, 2x/day       ASSESSMENT:    CLINICAL IMPRESSION: Pt exhibits increase in active forward reach in standing position.  Pt is making good progress and currently has decreased R shoulder pain. She requires corrections with mild scapular elevation compensatory strategy with verbal and tactile cueing. Pt has remaining deficits in AROM, strength, posture and functional use. Pt will benefit from continued skilled therapeutic intervention to address the deficits as needed to improve function.    REHAB POTENTIAL: Good   CLINICAL DECISION MAKING: Stable/uncomplicated   EVALUATION COMPLEXITY: Low     GOALS: Goals reviewed with patient? Yes   SHORT TERM GOALS: Target date: 12/15/2022   Pt will be independent with HEP to improve strength and decrease neck pain to improve pain-free function at home and work. Baseline: 11/16/22: Baseline HEP initiated, HEP previously given from referring office reviewed.   12/28/22: Patient reports doing well with her HEP Goal status: ACHIEVED     LONG TERM GOALS: Target date: 01/12/2023   Pt will increase FOTO to at least 53 to demonstrate significant improvement in function at home and work related to neck pain  Baseline: 11/16/22: FOTO 29.     12/28/22:  59/53 Goal status: ACHIEVED   2.  Pt will decrease worst shoulder pain by at least 3 points on the NPRS in order to demonstrate clinically significant reduction in shoulder pain. Baseline: 11/16/22: 8-9/10 at worst   12/28/22: Up to 7-8/10 at worst  Goal status: ON-GOING    3.   Patient will have shoulder flexion AROM to at least 130 deg as needed for functional reaching, self-care activities, household chores Baseline: 11/16/22: R shoulder flexion AROM not tested due to post-op status, flexion PROM to 90 deg (up to  current restriction).      12/28/22: R shoulder flexion AROM to 117 deg in standing.  Goal status: ON-GOING   4. Pt will increase strength of tested shoulder musculature to at least 4/5 or greater MMT grade in order to demonstrate improvement in strength and function as needed for light to moderate lifting and household chores          Baseline: 11/16/22: MMT 2-/5 globally given decreased shoulder ROM.      12/28/22: Ongoing deltoid and internal rotation weakness Goal status: ON-GOING     PLAN: PT FREQUENCY: 2x/week   PT DURATION: 4-6 weeks   PLANNED INTERVENTIONS: Therapeutic exercises, Therapeutic activity, Neuromuscular re-education, Balance training, Gait training, Patient/Family education, Joint manipulation, Joint mobilization, Dry Needling, Electrical stimulation, Cryotherapy, Moist heat, Traction, Ultrasound, Ionotophoresis '4mg'$ /ml Dexamethasone, and Manual therapy   PLAN FOR NEXT SESSION: Gentle R shoulder PROM and AROM as tolerated by patient. Continue with graded strengthening as tolerated and improving ability to complete reaching and overhead work.      Tamera Reason, SPT  Valentina Gu, PT, DPT [phone]  This entire session was performed under direct supervision and direction of a licensed therapist/therapist assistant. I have personally read, edited and approve of the note as written.  Eilleen Kempf, PT 01/06/2023, 3:48 PM

## 2023-01-10 DIAGNOSIS — Z471 Aftercare following joint replacement surgery: Secondary | ICD-10-CM | POA: Diagnosis not present

## 2023-01-10 DIAGNOSIS — Z96611 Presence of right artificial shoulder joint: Secondary | ICD-10-CM | POA: Diagnosis not present

## 2023-01-11 ENCOUNTER — Ambulatory Visit: Payer: Medicare Other | Admitting: Physical Therapy

## 2023-01-11 DIAGNOSIS — M6281 Muscle weakness (generalized): Secondary | ICD-10-CM | POA: Diagnosis not present

## 2023-01-11 DIAGNOSIS — G8929 Other chronic pain: Secondary | ICD-10-CM | POA: Diagnosis not present

## 2023-01-11 DIAGNOSIS — M25511 Pain in right shoulder: Secondary | ICD-10-CM

## 2023-01-11 DIAGNOSIS — M25611 Stiffness of right shoulder, not elsewhere classified: Secondary | ICD-10-CM | POA: Diagnosis not present

## 2023-01-11 NOTE — Therapy (Signed)
OUTPATIENT PHYSICAL THERAPY TREATMENT   Patient Name: Renee [name] MRN: [id number] DOB:1946-05-11, 77 y.o., female Today's Date: 01/11/2023  PCP: Jinny Sanders, MD  REFERRING PROVIDER: Jenetta Loges, PA-C   END OF SESSION:   PT End of Session - 01/13/23 1250     Visit Number 14    Number of Visits 17    Date for PT Re-Evaluation 01/13/23    PT Start Time 7829    PT Stop Time 1645    PT Time Calculation (min) 48 min    Activity Tolerance Patient tolerated treatment well    Behavior During Therapy WFL for tasks assessed/performed               Past Medical History:  Diagnosis Date   Allergy    Anxiety    Arthritis    CAD (coronary artery disease)    non-obstructive. cath 11/11   Claustrophobia    Diabetes mellitus    borderline diabetic - diet controlled    GERD (gastroesophageal reflux disease)    History of chicken pox    History of phobia    clastrophobia   Hyperlipidemia    Hypertension    Hypothyroidism    Kidney stones    hx of    Lichen sclerosus    Psoriasis    Stroke ([place])    Urinary tract infection    hx of    Past Surgical History:  Procedure Laterality Date   APPENDECTOMY     CARDIAC CATHETERIZATION     2011   CARPAL TUNNEL RELEASE     Left trigger finger   CATARACT EXTRACTION W/PHACO Left 06/06/2018   Procedure: CATARACT EXTRACTION PHACO AND INTRAOCULAR LENS PLACEMENT ([place]);  Surgeon: Birder Robson, MD;  Location: [facility] ORS;  Service: Ophthalmology;  Laterality: Left;  [place]  00:23 AP% 16.0 CDE 3.69 Fluid pack lot # [id number]   CATARACT EXTRACTION W/PHACO Right 09/19/2018   Procedure: CATARACT EXTRACTION PHACO AND INTRAOCULAR LENS PLACEMENT ([facility]);  Surgeon: Birder Robson, MD;  Location: [facility];  Service: Ophthalmology;  Laterality: Right;  [place] 00:29.5 AP% 17.0 CDE$ 5.03 Fluid pack lot # [id number]   COLONOSCOPY WITH PROPOFOL N/A 05/13/2017   Procedure: COLONOSCOPY WITH PROPOFOL;  Surgeon: Manya Silvas, MD;  Location: [facility]  ENDOSCOPY;  Service: Endoscopy;  Laterality: N/A;   ESOPHAGOGASTRODUODENOSCOPY (EGD) WITH PROPOFOL N/A 01/04/2020   Procedure: ESOPHAGOGASTRODUODENOSCOPY (EGD) WITH PROPOFOL;  Surgeon: [name], MD;  Location: [facility] ENDOSCOPY;  Service: Gastroenterology;  Laterality: N/A;   EYE SURGERY  2004   lasik - right eye    JOINT REPLACEMENT     KNEE SURGERY Right 2015   arthroscopy   REVERSE SHOULDER ARTHROPLASTY Right 10/21/2022   Procedure: REVERSE SHOULDER ARTHROPLASTY;  Surgeon: Justice Britain, MD;  Location: [facility] ORS;  Service: Orthopedics;  Laterality: Right;  119mn   TOTAL KNEE ARTHROPLASTY Bilateral 06/12/2015   Procedure: TOTAL KNEE BILATERAL;  Surgeon: FGaynelle Arabian MD;  Location: [facility] ORS;  Service: Orthopedics;  Laterality: Bilateral;  and epidural   TUBAL LIGATION     Tubes tied     and untied. and tied again   Patient Active Problem List   Diagnosis Date Noted   Pain in joint of left shoulder 11/03/2022   Statin myopathy 06/04/2022   Hordeolum externum of left upper eyelid 02/17/2022   Acute gout involving toe of right foot 08/12/2021   Chronic pain of both shoulders 08/12/2021   History of TIA (transient ischemic attack) 06/26/2021   Statin intolerance  01/15/2021   MDD (major depressive disorder), recurrent episode, moderate ([place]) 07/08/2020   Chronic insomnia 07/08/2020   Chronic neck pain 10/05/2019   Chronic diarrhea 03/30/2018   GAD (generalized anxiety disorder) 01/31/2018   Osteopenia 12/08/2017   Vitamin D deficiency 03/04/2016   Allergic rhinitis 03/04/2016   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)  ([place]) 10/20/2015   External hemorrhoid 10/20/2015   Psoriasis 10/20/2015   History of bilateral knee replacement 10/20/2015   Osteoarthritis of knee 11/25/2014   Hypertension associated with diabetes ([place]) 11/06/2010   CAD, NATIVE VESSEL 11/06/2010   Hyperlipidemia associated with type 2 diabetes mellitus ([place]) 01/25/2006   Adult  hypothyroidism 11/15/2005    REFERRING DIAG: Z96.619 (ICD-10-CM) - Presence of unspecified artificial shoulder joint    THERAPY DIAG:  Acute pain of right shoulder  Stiffness of right shoulder, not elsewhere classified  Muscle weakness (generalized)  Rationale for Evaluation and Treatment Rehabilitation  PERTINENT HISTORY: Pt is 77 year old female s/p right reverse TSA 10/21/22. Patient reports performing home exercises given by [facility] including pendulums, elbow AROM, wrist AROM, and sliding hand on her lap. Patient reports more pain rTSA compared to bilat TKA. Pt has been released from using her sling; pt uses only for going out in public and walking outside. Pt denies significant post-operative complications.    Pain:  Pain Intensity: Present: 3-4/10, Best: 1-2/10, Worst: 8-9/10 Pain location: Lateral arm and down to R elbow Pain Quality: aching  Radiating: Yes , down to R elbow Numbness/Tingling: Yes, NT down to her R fingers, affecting all digits  Aggravating factors: trying to lie down, raising arm inadvertently Relieving factors: avoiding unnecessary R shoulder activity  History of prior shoulder or neck/shoulder injury, pain, surgery, or therapy: Yes, PT prior to rTSA  Falls: Has patient fallen in last 6 months? No, Number of falls: N/A Dominant hand: left Imaging: Yes , X-rays post-operatively demonstrated good healing, no hardware issues   Prior level of function: Independent Occupational demands: N/A Hobbies:  walking exercise regimen Red flags (personal history of cancer, chills/fever, night sweats, nausea, vomiting, unrelenting pain): Negative   Precautions: rTSA post-op precautions   Weight Bearing Restrictions: No   Living Environment Lives with: lives alone, children live in [place] close to her; 4 daughters, 2 sons Lives in: House/apartment     Patient Goals: Return to walking exercise regimen, able to travel/drive as needed    PRECAUTIONS: PROM  and AROM as tolerated weeks 8-12  Right Reverse TSA, DOS: 10/21/22    SUBJECTIVE:  SUBJECTIVE STATEMENT:  Pt reports no notable changes and has been doing well with most home activities.   PAIN:  Are you having pain? No   OBJECTIVE: (objective measures completed at initial evaluation unless otherwise dated)   Patient Surveys  FOTO: 44, predicted outcome score of 71   Cognition Patient is oriented to person, place, and time.  Recent memory is intact.  Remote memory is intact.  Attention span and concentration are intact.  Expressive speech is intact.  Patient's fund of knowledge is within normal limits for educational level.                          Gross Musculoskeletal Assessment Tremor: None Bulk: Normal Tone: Normal     Posture Forward head, rounded shoulders, inc thoracic kyphosis       AROM         AROM (Normal range in degrees) AROM 11/17/2022 AROM 12/28/22    Right Left Right Left  Shoulder        Flexion Deferred Deferred 117 129  Extension Deferred Deferred    Abduction Deferred Deferred 105 143  External Rotation Deferred Deferred 52 76  Internal Rotation Deferred Deferred    Hands Behind Head Deferred Deferred T1 T3  Hands Behind Back Deferred Deferred             Elbow        Flexion WNL WNL    Extension -15 WNL    Pronation WNL WNL    Supination WNL WNL    (* = pain; Blank rows = not tested)       LE MMT: MMT (out of 5) Right 11/17/2022 Left 11/17/2022 Right 12/28/22 Left 12/28/22           Shoulder     Flexion Deferred Deferred 4- 4+  Extension        Abduction Deferred Deferred 3+ 4  External rotation Deferred Deferred 4 4-  Internal rotation Deferred Deferred 3+ 4  Horizontal abduction        Horizontal adduction        Lower Trapezius        Rhomboids                  Elbow    Flexion Deferred Deferred 4+ 5  Extension Deferred Deferred 4+ 5  Pronation        Supination                 (* = pain; Blank rows = not tested)   Sensation Grossly intact to light touch bilateral UE as determined by testing dermatomes C2-T2. Proprioception and hot/cold testing deferred on this date.     Palpation   Location LEFT  RIGHT           Subocciptials      Cervical paraspinals      Upper Trapezius   1  Levator Scapulae      Rhomboid Major/Minor      Sternoclavicular joint      Acromioclavicular joint      Coracoid process      Long head of biceps      Supraspinatus      Infraspinatus      Subscapularis      Teres Minor      Teres Major      Pectoralis Major   0  Pectoralis Minor   0  Anterior Deltoid   1  Lateral  Deltoid   1  Posterior Deltoid   1  Latissimus Dorsi      Sternocleidomastoid      (Blank rows = not tested) Graded on 0-4 scale (0 = no pain, 1 = pain, 2 = pain with wincing/grimacing/flinching, 3 = pain with withdrawal, 4 = unwilling to allow palpation), (Blank rows = not tested)     Accessory Motions/Glides Deferred       TODAY'S TREATMENT      Manual Therapy - for symptom modulation, soft tissue sensitivity and mobility, shoulder ROM   R shoulder PROM flexion, ER, IR hand to abdomen, and abd within pt tolerance (no aggressive overpressure/stretching); x 5 minutes   *not today* STM R middle and anterior deltoid, biceps; x 2 minutes MHP (unbilled) utilized following manual therapy for analgesic effect and improved soft tissue extensibility; along R anterior and anterior arm/elbow x 5 minutes with pt in supine lying.    Therapeutic Exercise - for shoulder complex ROM as needed for functional reaching and household chores;    Upper body ergometer; 2 minutes forward, 2 minutes backward; for improved soft tissue mobility with increased tissue temperature following warm-up   Supine circles with Dbell; 4-lb, x20  CW/CCW   Supine ABCs with 4-lb Dbell; x 3 minutes   Shoulder bilateral flexion AROM, 2x12, utilized mirror feedback to limit scapular hiking    Standing bilateral shoulder abduction AROM, ROM as tolerated; 2x12, utilized mirror feedback to limit scapular hiking    Finger ladder, forward elevation; x10, 5 sec hold at top   Wall wash for forward flexion; 2x10      Standing ER/IR with Green Tband; 2x10    -heavy tactile and verbal cueing for technique, PT demonstration for carryover of technique     PATIENT EDUCATION: Education on appropriate exercise technique for ER/IR isotonics. Discussed current HEP and completing forward elevation without scapular hiking compensatory pattern     *not today*  Standing Tband row; Blue Tband; 2x10 Shoulder flexion AROM with lawn chair progression; bed raised to 60 deg; 2x10 in each position    -WFL ROM demonstrated  Wall slide; x10, 5 sec hold, shoulder flexion Sidelying shoulder abduction, ROM as tolerated today; L sidelying; 2x10  Shoulder isometrics; flexion, ER; x15, 5 sec hold (perform abduction next visit) Supine; Wand flexion AAROM; 1x10 - verbal cueing for safe ROM, forward elevation to 115 Supine; Wand ER AAROM; to 45 deg only; 2x10  Shoulder ER with elbow on tabletop, in sitting; x20 Pulley, forward elevation; x 2 minutes Wall ball circles, CW/CCW; 2x20 ea dir with shoulder at 90 deg forward elevation        PATIENT EDUCATION:  Education details: Plan of care, HEP Person educated: Patient Education method: Explanation Education comprehension: verbalized understanding     HOME EXERCISE PROGRAM: Exercises Access Code [email or web address] - Reclined Shoulder Elevation  - 2 x daily - 7 x weekly - 2 sets - 10 reps - Shoulder Abduction, standing - "[employer]"  - 2 x daily - 7 x weekly - 2 sets - 10 reps - Shoulder Flexion Wall Slide with Towel  - 2 x daily - 7 x weekly - 2 sets - 10 reps - 5sec hold - Supine Shoulder Flexion AAROM with  dowel; 2x10, 2x/day       ASSESSMENT:    CLINICAL IMPRESSION: Pt exhibits increase in active forward reach in standing position. Pt is making good progress and currently has decreased R shoulder pain. She requires corrections with mild scapular  elevation compensatory strategy with verbal and tactile cueing. Pt has remaining deficits in AROM, strength, posture and functional use. Pt will benefit from continued skilled therapeutic intervention to address the deficits as needed to improve function.    REHAB POTENTIAL: Good   CLINICAL DECISION MAKING: Stable/uncomplicated   EVALUATION COMPLEXITY: Low     GOALS: Goals reviewed with patient? Yes   SHORT TERM GOALS: Target date: 12/15/2022   Pt will be independent with HEP to improve strength and decrease neck pain to improve pain-free function at home and work. Baseline: 11/16/22: Baseline HEP initiated, HEP previously given from referring office reviewed.   12/28/22: Patient reports doing well with her HEP Goal status: ACHIEVED     LONG TERM GOALS: Target date: 01/12/2023   Pt will increase FOTO to at least 53 to demonstrate significant improvement in function at home and work related to neck pain  Baseline: 11/16/22: FOTO 29.     12/28/22:  59/53 Goal status: ACHIEVED   2.  Pt will decrease worst shoulder pain by at least 3 points on the NPRS in order to demonstrate clinically significant reduction in shoulder pain. Baseline: 11/16/22: 8-9/10 at worst   12/28/22: Up to 7-8/10 at worst  Goal status: ON-GOING    3.   Patient will have shoulder flexion AROM to at least 130 deg as needed for functional reaching, self-care activities, household chores Baseline: 11/16/22: R shoulder flexion AROM not tested due to post-op status, flexion PROM to 90 deg (up to current restriction).      12/28/22: R shoulder flexion AROM to 117 deg in standing.  Goal status: ON-GOING   4. Pt will increase strength of tested shoulder musculature to at least 4/5 or  greater MMT grade in order to demonstrate improvement in strength and function as needed for light to moderate lifting and household chores          Baseline: 11/16/22: MMT 2-/5 globally given decreased shoulder ROM.      12/28/22: Ongoing deltoid and internal rotation weakness Goal status: ON-GOING     PLAN: PT FREQUENCY: 2x/week   PT DURATION: 4-6 weeks   PLANNED INTERVENTIONS: Therapeutic exercises, Therapeutic activity, Neuromuscular re-education, Balance training, Gait training, Patient/Family education, Joint manipulation, Joint mobilization, Dry Needling, Electrical stimulation, Cryotherapy, Moist heat, Traction, Ultrasound, Ionotophoresis '4mg'$ /ml Dexamethasone, and Manual therapy   PLAN FOR NEXT SESSION: Gentle R shoulder PROM and AROM as tolerated by patient. Continue with graded strengthening as tolerated and improving ability to complete reaching and overhead work.      Tamera Reason, SPT  Valentina Gu, PT, DPT [phone]  This entire session was performed under direct supervision and direction of a licensed therapist/therapist assistant. I have personally read, edited and approve of the note as written.

## 2023-01-13 ENCOUNTER — Encounter: Payer: Self-pay | Admitting: Physical Therapy

## 2023-01-13 ENCOUNTER — Ambulatory Visit: Payer: Medicare Other | Admitting: Physical Therapy

## 2023-01-13 DIAGNOSIS — M25611 Stiffness of right shoulder, not elsewhere classified: Secondary | ICD-10-CM | POA: Diagnosis not present

## 2023-01-13 DIAGNOSIS — M6281 Muscle weakness (generalized): Secondary | ICD-10-CM | POA: Diagnosis not present

## 2023-01-13 DIAGNOSIS — M25511 Pain in right shoulder: Secondary | ICD-10-CM | POA: Diagnosis not present

## 2023-01-13 DIAGNOSIS — G8929 Other chronic pain: Secondary | ICD-10-CM | POA: Diagnosis not present

## 2023-01-13 NOTE — Therapy (Signed)
OUTPATIENT PHYSICAL THERAPY TREATMENT AND GOAL UPDATE   Patient Name: Renee [name] MRN: [id number] DOB:April 09, 1946, 77 y.o., female Today's Date: 01/11/2023  PCP: Jinny Sanders, MD  REFERRING PROVIDER: Jenetta Loges, PA-C   END OF SESSION:   PT End of Session - 01/13/23 1729     Visit Number 15    Number of Visits 17    Date for PT Re-Evaluation 01/13/23    PT Start Time 1600    PT Stop Time 1646    PT Time Calculation (min) 46 min    Activity Tolerance Patient tolerated treatment well    Behavior During Therapy WFL for tasks assessed/performed                Past Medical History:  Diagnosis Date   Allergy    Anxiety    Arthritis    CAD (coronary artery disease)    non-obstructive. cath 11/11   Claustrophobia    Diabetes mellitus    borderline diabetic - diet controlled    GERD (gastroesophageal reflux disease)    History of chicken pox    History of phobia    clastrophobia   Hyperlipidemia    Hypertension    Hypothyroidism    Kidney stones    hx of    Lichen sclerosus    Psoriasis    Stroke ([place])    Urinary tract infection    hx of    Past Surgical History:  Procedure Laterality Date   APPENDECTOMY     CARDIAC CATHETERIZATION     2011   CARPAL TUNNEL RELEASE     Left trigger finger   CATARACT EXTRACTION W/PHACO Left 06/06/2018   Procedure: CATARACT EXTRACTION PHACO AND INTRAOCULAR LENS PLACEMENT ([place]);  Surgeon: Birder Robson, MD;  Location: [facility] ORS;  Service: Ophthalmology;  Laterality: Left;  [place]  00:23 AP% 16.0 CDE 3.69 Fluid pack lot # [id number]   CATARACT EXTRACTION W/PHACO Right 09/19/2018   Procedure: CATARACT EXTRACTION PHACO AND INTRAOCULAR LENS PLACEMENT ([facility]);  Surgeon: Birder Robson, MD;  Location: [facility] OR[facility];  Service: Ophthalmology;  Laterality: Right;  [place] 00:29.5 AP% 17.0 CDE$ 5.03 Fluid pack lot # [id number]   COLONOSCOPY WITH PROPOFOL N/A 05/13/2017   Procedure: COLONOSCOPY WITH PROPOFOL;  Surgeon: Manya Silvas, MD;   Location: [facility] ENDOSCOPY;  Service: Endoscopy;  Laterality: N/A;   ESOPHAGOGASTRODUODENOSCOPY (EGD) WITH PROPOFOL N/A 01/04/2020   Procedure: ESOPHAGOGASTRODUODENOSCOPY (EGD) WITH PROPOFOL;  Surgeon: [name], MD;  Location: [facility] ENDOSCOPY;  Service: Gastroenterology;  Laterality: N/A;   EYE SURGERY  2004   lasik - right eye    JOINT REPLACEMENT     KNEE SURGERY Right 2015   arthroscopy   REVERSE SHOULDER ARTHROPLASTY Right 10/21/2022   Procedure: REVERSE SHOULDER ARTHROPLASTY;  Surgeon: Justice Britain, MD;  Location: [facility] ORS;  Service: Orthopedics;  Laterality: Right;  130mn   TOTAL KNEE ARTHROPLASTY Bilateral 06/12/2015   Procedure: TOTAL KNEE BILATERAL;  Surgeon: FGaynelle Arabian MD;  Location: [facility] ORS;  Service: Orthopedics;  Laterality: Bilateral;  and epidural   TUBAL LIGATION     Tubes tied     and untied. and tied again   Patient Active Problem List   Diagnosis Date Noted   Pain in joint of left shoulder 11/03/2022   Statin myopathy 06/04/2022   Hordeolum externum of left upper eyelid 02/17/2022   Acute gout involving toe of right foot 08/12/2021   Chronic pain of both shoulders 08/12/2021   History of TIA (transient ischemic attack) 06/26/2021  Statin intolerance 01/15/2021   MDD (major depressive disorder), recurrent episode, moderate (HCC) 07/08/2020   Chronic insomnia 07/08/2020   Chronic neck pain 10/05/2019   Chronic diarrhea 03/30/2018   GAD (generalized anxiety disorder) 01/31/2018   Osteopenia 12/08/2017   Vitamin D deficiency 03/04/2016   Allergic rhinitis 03/04/2016   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)  ([place]) 10/20/2015   External hemorrhoid 10/20/2015   Psoriasis 10/20/2015   History of bilateral knee replacement 10/20/2015   Osteoarthritis of knee 11/25/2014   Hypertension associated with diabetes ([place]) 11/06/2010   CAD, NATIVE VESSEL 11/06/2010   Hyperlipidemia associated with type 2 diabetes mellitus ([place]) 01/25/2006    Adult hypothyroidism 11/15/2005    REFERRING DIAG: Z96.619 (ICD-10-CM) - Presence of unspecified artificial shoulder joint    THERAPY DIAG:  acute pain of right shoulder, stiffness of right shoulder, not elsewhere classified, muscle weakness (generalized)  Acute pain of right shoulder  Stiffness of right shoulder, not elsewhere classified  Muscle weakness (generalized)  Rationale for Evaluation and Treatment Rehabilitation  PERTINENT HISTORY: Pt is 77 year old female s/p right reverse TSA 10/21/22. Patient reports performing home exercises given by [facility] health including pendulums, elbow AROM, wrist AROM, and sliding hand on her lap. Patient reports more pain rTSA compared to bilat TKA. Pt has been released from using her sling; pt uses only for going out in public and walking outside. Pt denies significant post-operative complications.    Pain:  Pain Intensity: Present: 3-4/10, Best: 1-2/10, Worst: 8-9/10 Pain location: Lateral arm and down to R elbow Pain Quality: aching  Radiating: Yes , down to R elbow Numbness/Tingling: Yes, NT down to her R fingers, affecting all digits  Aggravating factors: trying to lie down, raising arm inadvertently Relieving factors: avoiding unnecessary R shoulder activity  History of prior shoulder or neck/shoulder injury, pain, surgery, or therapy: Yes, PT prior to rTSA  Falls: Has patient fallen in last 6 months? No, Number of falls: N/A Dominant hand: left Imaging: Yes , X-rays post-operatively demonstrated good healing, no hardware issues   Prior level of function: Independent Occupational demands: N/A Hobbies:  walking exercise regimen Red flags (personal history of cancer, chills/fever, night sweats, nausea, vomiting, unrelenting pain): Negative   Precautions: rTSA post-op precautions   Weight Bearing Restrictions: No   Living Environment Lives with: lives alone, children live in [place] close to her; 4 daughters, 2 sons Lives in:  House/apartment     Patient Goals: Return to walking exercise regimen, able to travel/drive as needed    PRECAUTIONS: PROM and AROM as tolerated weeks 8-12  Right Reverse TSA, DOS: 10/21/22    SUBJECTIVE:  SUBJECTIVE STATEMENT:  Pt reports no notable changes and has been doing well with most home activities. Patient reports minimal R shoulder pain at this time, no more than 2/10 over the previous week. Patient feels that she has been independent with self-care ADLs for last several weeks and she feels her function with R arm is essentially back to normal.    PAIN:  Are you having pain? No   OBJECTIVE: (objective measures completed at initial evaluation unless otherwise dated)   Patient Surveys  FOTO: 48, predicted outcome score of 52   Cognition Patient is oriented to person, place, and time.  Recent memory is intact.  Remote memory is intact.  Attention span and concentration are intact.  Expressive speech is intact.  Patient's fund of knowledge is within normal limits for educational level.                          Gross Musculoskeletal Assessment Tremor: None Bulk: Normal Tone: Normal     Posture Forward head, rounded shoulders, inc thoracic kyphosis       AROM           AROM (Normal range in degrees) AROM 11/17/2022 AROM 12/28/22 AROM 01/13/23    Right Left Right Left Right Left   Shoulder          Flexion Deferred Deferred 117 129 115 121  Extension Deferred Deferred      Abduction Deferred Deferred 105 143 105 110  External Rotation Deferred Deferred 52 76 72 87  Internal Rotation Deferred Deferred      Hands Behind Head Deferred Deferred T1 T3 T2 T4  Hands Behind Back Deferred Deferred                 Elbow          Flexion WNL WNL      Extension -15 WNL      Pronation WNL  WNL      Supination WNL WNL      (* = pain; Blank rows = not tested)       UE MMT: MMT (out of 5) Right 11/17/2022 Left 11/17/2022 Right 12/28/22 Left 12/28/22 Right 01/13/23 Left 01/13/23             Shoulder       Flexion Deferred Deferred 4- 4+ 4- 4+  Extension          Abduction Deferred Deferred 3+ 4 4- 4+  External rotation Deferred Deferred 4 4- 4 4+  Internal rotation Deferred Deferred 3+ 4 4 4+  Horizontal abduction          Horizontal adduction          Lower Trapezius          Rhomboids                     Elbow      Flexion Deferred Deferred 4+ '5 5 5  '$ Extension Deferred Deferred 4+ 5 4+ 4+  Pronation          Supination                     (* = pain; Blank rows = not tested)   Sensation Grossly intact to light touch bilateral UE as determined by testing dermatomes C2-T2. Proprioception and hot/cold testing deferred on this date.     Palpation   Location LEFT  RIGHT  Subocciptials      Cervical paraspinals      Upper Trapezius   1  Levator Scapulae      Rhomboid Major/Minor      Sternoclavicular joint      Acromioclavicular joint      Coracoid process      Long head of biceps      Supraspinatus      Infraspinatus      Subscapularis      Teres Minor      Teres Major      Pectoralis Major   0  Pectoralis Minor   0  Anterior Deltoid   1  Lateral Deltoid   1  Posterior Deltoid   1  Latissimus Dorsi      Sternocleidomastoid      (Blank rows = not tested) Graded on 0-4 scale (0 = no pain, 1 = pain, 2 = pain with wincing/grimacing/flinching, 3 = pain with withdrawal, 4 = unwilling to allow palpation), (Blank rows = not tested)     Accessory Motions/Glides Deferred       TODAY'S TREATMENT      Manual Therapy - for symptom modulation, soft tissue sensitivity and mobility, shoulder ROM   *not today* STM R middle and anterior deltoid, biceps; x 2 minutes MHP (unbilled) utilized following manual therapy for analgesic effect and  improved soft tissue extensibility; along R anterior and anterior arm/elbow x 5 minutes with pt in supine lying. R shoulder PROM flexion, ER, IR hand to abdomen, and abd within pt tolerance (no aggressive overpressure/stretching); x 5 minutes   Therapeutic Exercise - for shoulder complex ROM as needed for functional reaching and household chores;     GOAL UPDATE PERFORMED  Upper body ergometer; 2 minutes forward, 2 minutes backward; for improved soft tissue mobility with increased tissue temperature following warm-up  Standing ER/IR with Green Tband; 2x10    -heavy tactile and verbal cueing for technique, PT demonstration for carryover of technique    PATIENT EDUCATION: Education on appropriate exercise technique for ER/IR isotonics. Updated HEP with banded ER, banded IR, AROM shoulder abduction and completing forward elevation without scapular hiking compensatory pattern to continue more at home.    *not today*  Supine circles with Dbell; 4-lb, x20 CW/CCW  Supine ABCs with 4-lb Dbell; x 3 minutes  Shoulder bilateral flexion AROM, 2x12, utilized mirror feedback to limit scapular hiking   Standing bilateral shoulder abduction AROM, ROM as tolerated; 2x12, utilized mirror feedback to limit scapular hiking   Finger ladder, forward elevation; x10, 5 sec hold at top  Wall wash for forward flexion; 2x10  Standing Tband row; Blue Tband; 2x10 Shoulder flexion AROM with lawn chair progression; bed raised to 60 deg; 2x10 in each position    -WFL ROM demonstrated Wall slide; x10, 5 sec hold, shoulder flexion Sidelying shoulder abduction, ROM as tolerated today; L sidelying; 2x10  Shoulder isometrics; flexion, ER; x15, 5 sec hold (perform abduction next visit) Supine; Wand flexion AAROM; 1x10 - verbal cueing for safe ROM, forward elevation to 115 Supine; Wand ER AAROM; to 45 deg only; 2x10  Shoulder ER with elbow on tabletop, in sitting; x20 Pulley, forward elevation; x 2 minutes Wall ball  circles, CW/CCW; 2x20 ea dir with shoulder at 90 deg forward elevation        PATIENT EDUCATION:  Education details: Plan of care, HEP Person educated: Patient Education method: Explanation Education comprehension: verbalized understanding     HOME EXERCISE PROGRAM: Exercises Access Code [place] -  Reclined Shoulder Elevation  - 2 x daily - 7 x weekly - 2 sets - 10 reps - Shoulder Abduction, standing - "[employer]"  - 2 x daily - 7 x weekly - 2 sets - 10 reps - Shoulder Flexion Wall Slide with Towel  - 2 x daily - 7 x weekly - 2 sets - 10 reps - 5sec hold - Supine Shoulder Flexion AAROM with dowel; 2x10, 2x/day       ASSESSMENT:    CLINICAL IMPRESSION: Pt still has limited right shoulder Abduction and Flexion AROM and muscle weakness in shoulder flexion and abduction. Patient has already met FOTO score during previous reevaluation. She exhibits increase in active forward elevation in standing position. Pt is making good progress and currently has decreased R shoulder pain. She requires corrections with mild scapular elevation compensatory strategy with verbal and tactile cueing. Pt has remaining deficits in AROM, strength, posture and functional use. Pt has been released from PT by referring surgeon. We discussed continued HEP for ongoing work on R shoulder AROM and strength. Pt will benefit from continued home exercise program to address residual ROM/strength deficits; pt may resume PT as needed pending referral.    REHAB POTENTIAL: Good   CLINICAL DECISION MAKING: Stable/uncomplicated   EVALUATION COMPLEXITY: Low     GOALS: Goals reviewed with patient? Yes   SHORT TERM GOALS: Target date: 12/15/2022   Pt will be independent with HEP to improve strength and decrease neck pain to improve pain-free function at home and work. Baseline: 11/16/22: Baseline HEP initiated, HEP previously given from referring office reviewed.   12/28/22: Patient reports doing well with her HEP Goal  status: ACHIEVED     LONG TERM GOALS: Target date: 01/12/2023   Pt will increase FOTO to at least 53 to demonstrate significant improvement in function at home and work related to neck pain  Baseline: 11/16/22: FOTO 29.     12/28/22:  59/53 Goal status: ACHIEVED   2.  Pt will decrease worst shoulder pain by at least 3 points on the NPRS in order to demonstrate clinically significant reduction in shoulder pain. Baseline: 11/16/22: 8-9/10 at worst   12/28/22: Up to 7-8/10 at worst     01/13/23: up to 2/10 at worst Goal status: ACHIEVED    3.   Patient will have shoulder flexion AROM to at least 130 deg as needed for functional reaching, self-care activities, household chores Baseline: 11/16/22: R shoulder flexion AROM not tested due to post-op status, flexion PROM to 90 deg (up to current restriction).      12/28/22: R shoulder flexion AROM to 117 deg in standing.   01/13/23: R shoulder flexion AROM 115 deg in standing.  Goal status: ON-GOING   4. Pt will increase strength of tested shoulder musculature to at least 4/5 or greater MMT grade in order to demonstrate improvement in strength and function as needed for light to moderate lifting and household chores          Baseline: 11/16/22: MMT 2-/5 globally given decreased shoulder ROM.      12/28/22: Ongoing deltoid and internal rotation weakness.   01/13/23: Remaining deltoid weakness. Goal status: ON-GOING     PLAN: PT FREQUENCY: -   PT DURATION: -   PLANNED INTERVENTIONS: Therapeutic exercises, Therapeutic activity, Neuromuscular re-education, Balance training, Gait training, Patient/Family education, Joint manipulation, Joint mobilization, Dry Needling, Electrical stimulation, Cryotherapy, Moist heat, Traction, Ultrasound, Ionotophoresis '4mg'$ /ml Dexamethasone, and Manual therapy   PLAN FOR NEXT SESSION: Pt is  currently continuing with home program and will discharge from formal PT given release from referring surgeon.      Tamera Reason,  SPT  Valentina Gu, PT, DPT [phone]  This entire session was performed under direct supervision and direction of a licensed therapist/therapist assistant. I have personally read, edited and approve of the note as written.

## 2023-01-17 ENCOUNTER — Encounter: Payer: Self-pay | Admitting: Obstetrics & Gynecology

## 2023-01-17 ENCOUNTER — Ambulatory Visit (INDEPENDENT_AMBULATORY_CARE_PROVIDER_SITE_OTHER): Payer: Medicare Other | Admitting: Obstetrics & Gynecology

## 2023-01-17 ENCOUNTER — Other Ambulatory Visit (HOSPITAL_COMMUNITY)
Admission: RE | Admit: 2023-01-17 | Discharge: 2023-01-17 | Disposition: A | Discharge status: Home / Self Care | Payer: Medicare Other | Source: Ambulatory Visit | Attending: Obstetrics & Gynecology | Admitting: Obstetrics & Gynecology

## 2023-01-17 VITALS — BP 180/89 | HR 95 | Resp 16 | Ht 64.0 in | Wt 141.0 lb

## 2023-01-17 DIAGNOSIS — L280 Lichen simplex chronicus: Secondary | ICD-10-CM

## 2023-01-17 DIAGNOSIS — N9089 Other specified noninflammatory disorders of vulva and perineum: Secondary | ICD-10-CM | POA: Diagnosis not present

## 2023-01-17 DIAGNOSIS — L900 Lichen sclerosus et atrophicus: Secondary | ICD-10-CM

## 2023-01-17 NOTE — Progress Notes (Addendum)
    GYNECOLOGY PROGRESS NOTE  Subjective:    Patient ID: Renee [name], female    DOB: [birth year], 77 y.o.   MRN: [id number]  HPI  Patient is a 77 y.o. divorced [id number] female who presents for follow up on lichen sclerosis of the vulva. She reports no new concerns with the lichen sclerosis. She continues to use the Clobetasol cream to treat symptoms, but only uses it sporadically.  The following portions of the patient's history were reviewed and updated as appropriate: allergies, current medications, past family history, past medical history, past social history, past surgical history, and problem list.  She has urinary incontinence and wears pads daily due to voiding accidents.  Review of Systems Pertinent items are noted in HPI.   Objective:   Blood pressure (!) 187/70, pulse 95, resp. rate 16, height 5' 4"$  (1.626 m), weight 141 lb (64 kg). Body mass index is 24.2 kg/m. General appearance: alert, cooperative, and no distress Abdomen: soft, non-tender; bowel sounds normal; no masses,  no organomegaly Pelvic: EG- marked erythema, marked leukopakia (see photo under media)  I prepped the perineal area with Hurricaine spray and then betadine after consenting her for a vulvar biopsy. I then injected 1 mL of 1% lidocaine with a 25 gauge needle. I used a 4 mm punch biopsy to obtain specimen. Silver nitrate was used to achieve hemostasis. She tolerated the procedure well.   Assessment:   1. Lichen sclerosus et atrophicus   2. Lichen simplex chronicus   3. Vuvlar lesion/leukoplakia  Plan:   Await pathology Rec that she use the clobetasol nightly for the next 2 weeks, then QOD Come back 3 months/prn sooner    Clovia Cuff, MD [place].

## 2023-01-18 ENCOUNTER — Encounter: Payer: Medicare Other | Admitting: Physical Therapy

## 2023-01-20 ENCOUNTER — Encounter: Payer: Medicare Other | Admitting: Physical Therapy

## 2023-01-20 DIAGNOSIS — Z1231 Encounter for screening mammogram for malignant neoplasm of breast: Secondary | ICD-10-CM | POA: Diagnosis not present

## 2023-01-20 LAB — HM MAMMOGRAPHY

## 2023-01-21 ENCOUNTER — Encounter: Payer: Self-pay | Admitting: Family Medicine

## 2023-01-24 ENCOUNTER — Encounter: Payer: Self-pay | Admitting: Obstetrics & Gynecology

## 2023-01-24 LAB — SURGICAL PATHOLOGY

## 2023-02-07 ENCOUNTER — Other Ambulatory Visit: Payer: Self-pay | Admitting: Family Medicine

## 2023-02-07 DIAGNOSIS — D2261 Melanocytic nevi of right upper limb, including shoulder: Secondary | ICD-10-CM | POA: Diagnosis not present

## 2023-02-07 DIAGNOSIS — D2271 Melanocytic nevi of right lower limb, including hip: Secondary | ICD-10-CM | POA: Diagnosis not present

## 2023-02-07 DIAGNOSIS — Z85828 Personal history of other malignant neoplasm of skin: Secondary | ICD-10-CM | POA: Diagnosis not present

## 2023-02-07 DIAGNOSIS — X32XXXA Exposure to sunlight, initial encounter: Secondary | ICD-10-CM | POA: Diagnosis not present

## 2023-02-07 DIAGNOSIS — D485 Neoplasm of uncertain behavior of skin: Secondary | ICD-10-CM | POA: Diagnosis not present

## 2023-02-07 DIAGNOSIS — L900 Lichen sclerosus et atrophicus: Secondary | ICD-10-CM | POA: Diagnosis not present

## 2023-02-07 DIAGNOSIS — L570 Actinic keratosis: Secondary | ICD-10-CM | POA: Diagnosis not present

## 2023-02-07 DIAGNOSIS — C44629 Squamous cell carcinoma of skin of left upper limb, including shoulder: Secondary | ICD-10-CM | POA: Diagnosis not present

## 2023-02-08 NOTE — Telephone Encounter (Signed)
Last office visit 12/07/22 for CPE.  Last refilled 11/008/22 for 60 g with no refills.  Next Appt: 06/08/23 for CPE.

## 2023-02-16 ENCOUNTER — Telehealth: Payer: Self-pay | Admitting: Family Medicine

## 2023-02-16 DIAGNOSIS — C44629 Squamous cell carcinoma of skin of left upper limb, including shoulder: Secondary | ICD-10-CM | POA: Diagnosis not present

## 2023-02-16 DIAGNOSIS — D0462 Carcinoma in situ of skin of left upper limb, including shoulder: Secondary | ICD-10-CM | POA: Diagnosis not present

## 2023-02-16 NOTE — Addendum Note (Signed)
Addended by: Eliezer Lofts E on: 02/16/2023 10:25 AM   Modules accepted: Level of Service

## 2023-02-16 NOTE — Telephone Encounter (Signed)
I made changes to note and charge as requested

## 2023-02-16 NOTE — Telephone Encounter (Signed)
FYI for Dr. Diona Browner, routing to [facility], but she may need you to change your E&M code  [place]- Please look into the following billing issue:  MD: Davy Pique acct# [id number] Date of Service: 12.12.23 Amount:  $325  Billing issue: patient received a bill for non-covered service, patient has Medicare primary and [employer] supplement secondary, therefore her visit should not have been coded as preventative visit [phone]). Claim needs to be re-coded  Patient had (annual wellness visit with health advisor on 16-Dec-2022 and [phone] was coded   Patient was advised that this process can take up to a week or more. Patient also was advised that they may receive additional bills while this is being looked into and they are to hold onto all statements until resolved.

## 2023-03-04 ENCOUNTER — Ambulatory Visit (INDEPENDENT_AMBULATORY_CARE_PROVIDER_SITE_OTHER): Payer: Medicare Other | Admitting: Podiatry

## 2023-03-04 DIAGNOSIS — Q828 Other specified congenital malformations of skin: Secondary | ICD-10-CM | POA: Diagnosis not present

## 2023-03-04 NOTE — Progress Notes (Signed)
Subjective:  Patient ID: Renee [name], female    DOB: 05-04-1946,  MRN: [id number]  Chief Complaint  Patient presents with   Nail Problem    Nail trim     77 y.o. female presents with the above complaint.  Patient presents with complaint of bilateral fifth metatarsal hyperkeratotic lesion.  Patient stated they are still very painful but the debridement does help.  She is in agreement thickened elongated dystrophic toenails x10 mild pain on palpation she would like for them to debride down to.   Review of Systems: Negative except as noted in the HPI. Denies N/V/F/Ch.  Past Medical History:  Diagnosis Date   Allergy    Anxiety    Arthritis    CAD (coronary artery disease)    non-obstructive. cath 11/11   Claustrophobia    Diabetes mellitus    borderline diabetic - diet controlled    GERD (gastroesophageal reflux disease)    History of chicken pox    History of phobia    clastrophobia   Hyperlipidemia    Hypertension    Hypothyroidism    Kidney stones    hx of    Lichen sclerosus    Psoriasis    Stroke ([place])    Urinary tract infection    hx of     Current Outpatient Medications:    acetaminophen (TYLENOL) 650 MG CR tablet, Take 1,300 mg by mouth every 8 (eight) hours as needed for pain., Disp: , Rfl:    aspirin EC 81 MG tablet, Take 81 mg by mouth daily., Disp: , Rfl:    Blood Glucose Monitoring Suppl (ONETOUCH VERIO) w/Device KIT, by Does not apply route. Use to check fasting blood sugar daily and an occasional 2 hr postprandial, Disp: , Rfl:    Carboxymethylcellulose Sodium (THERATEARS OP), Place 1 drop into both eyes at bedtime as needed (dry eyes)., Disp: , Rfl:    clobetasol ointment (TEMOVATE) 0.05 %, APPLY TOPICALLY DAILY AS NEEDED FOR PSORIASIS FLARES, Disp: 60 g, Rfl: 0   Coenzyme Q10 (COQ10) 200 MG CAPS, Take 200 mg by mouth daily., Disp: , Rfl:    Dulaglutide (TRULICITY) A999333 [id number] SOPN, Inject 0.75 mg into the skin once a week. Via [employer] PAP, Disp: ,  Rfl:    Emollient (CERAVE) CREA, Apply 1 application  topically 2 (two) times daily as needed (psoriasis)., Disp: , Rfl:    ezetimibe (ZETIA) 10 MG tablet, TAKE 1 TABLET BY MOUTH DAILY, Disp: 90 tablet, Rfl: 3   famotidine (PEPCID) 20 MG tablet, Take 20 mg by mouth daily as needed for heartburn or indigestion., Disp: , Rfl:    Flaxseed, Linseed, (FLAX SEED OIL PO), Take 1 capsule by mouth daily., Disp: , Rfl:    fluticasone (FLONASE) 50 MCG/ACT nasal spray, Place 1 spray into both nostrils daily as needed for allergies., Disp: , Rfl:    HYDROcodone-acetaminophen (NORCO/VICODIN) 5-325 MG tablet, Take 1 tablet by mouth every 4 (four) hours as needed for moderate pain., Disp: 20 tablet, Rfl: 0   Lancets (ONETOUCH ULTRASOFT) lancets, Use to check fasting blood sugar daily and an occasional 2 hr postprandial, Disp: 100 each, Rfl: 11   levothyroxine (SYNTHROID) 75 MCG tablet, TAKE 1 TABLET BY MOUTH EVERY DAY, Disp: 90 tablet, Rfl: 1   losartan (COZAAR) 50 MG tablet, TAKE 1 TABLET BY MOUTH EVERY DAY, Disp: 90 tablet, Rfl: 1   MAGNESIUM PO, Take 1 tablet by mouth daily., Disp: , Rfl:    meloxicam (MOBIC) 7.5 MG  tablet, Take 1 tablet by mouth daily as needed., Disp: , Rfl:    Multiple Vitamin (MULTIVITAMIN) tablet, Take 1 tablet by mouth daily., Disp: , Rfl:    nitroGLYCERIN (NITROSTAT) 0.4 MG SL tablet, Place 1 tablet (0.4 mg total) under the tongue every 5 (five) minutes as needed., Disp: 25 tablet, Rfl: 1   ondansetron (ZOFRAN) 4 MG tablet, Take 1 tablet (4 mg total) by mouth every 8 (eight) hours as needed for nausea or vomiting., Disp: 10 tablet, Rfl: 0   ONETOUCH VERIO test strip, USE TO CHECK FASTING BLOOD SUGAR DAILY AND AN OCCASIONAL 2 HR POSTPRANDIAL., Disp: 100 strip, Rfl: 11   Skin Protectants, Misc. (EUCERIN) cream, Apply 1 application  topically as needed (eczema)., Disp: , Rfl:    triamcinolone cream (KENALOG) 0.1 %, Apply 1 application topically 2 (two) times daily as needed (psoriasis).,  Disp: 454 g, Rfl: 0   Vitamin D, Cholecalciferol, 25 MCG (1000 UT) CAPS, Take 2,000 Units by mouth daily., Disp: , Rfl:   Social History   Tobacco Use  Smoking Status Never  Smokeless Tobacco Never    Allergies  Allergen Reactions   Celexa [Citalopram] Shortness Of Breath and Swelling    Throat swelling and difficulty breathing   Other Rash    Metal    Codeine Nausea And Vomiting   Metformin Nausea And Vomiting   Robaxin [Methocarbamol] Nausea Only   Tizanidine Other (See Comments)    Oversedated   Crestor [Rosuvastatin] Rash   Nickel Rash   Simvastatin Swelling and Rash   Objective:  There were no vitals filed for this visit. There is no height or weight on file to calculate BMI. Constitutional Well developed. Well nourished.  Vascular Dorsalis pedis pulses palpable bilaterally. Posterior tibial pulses palpable bilaterally. Capillary refill normal to all digits.  No cyanosis or clubbing noted. Pedal hair growth normal.  Neurologic Normal speech. Oriented to person, place, and time. Epicritic sensation to light touch grossly present bilaterally.  Dermatologic Nail Exam: Pt has thick disfigured discolored nails with subungual debris noted bilateral entire nail hallux through fifth toenails.  Pain on palpation to the nails. No open wounds. No skin lesions.  Orthopedic: Bilateral submetatarsal 5 porokeratotic/benign skin lesion with central nucleated core noted.  Upon debridement no pinpoint bleeding noted.  No wounds noted.   Radiographs:   3 views of skeletally mature adult bilateral foot: Midfoot arthritis noted.  Plantar and posterior heel spurring noted.  Plantarflexed fifth metatarsal noted with mild signs of tailor's bunion.  There is increase in lateral deviation angle. Assessment:   1. Porokeratosis        Plan:  Patient was evaluated and treated and all questions answered.  Bilateral submetatarsal 5 porokeratosis x2 -X-ray to the patient the etiology of  porokeratosis and was treatment options were extensively discussed.  Given the amount of pain she is having I believe she will benefit from debridement of the lesion followed by excision of the lesion.  I discussed this with the patient she states understanding like to proceed with that. -Using chisel blade and handle the lesion was debrided down to healthy striated tissue followed by excision of central nucleated core.  Immediate pain was noted after debridement and excision.  No pinpoint bleeding noted.  Onychomycosis with pain  -Nails palliatively debrided as below. -Educated on self-care  Procedure: Nail Debridement Rationale: pain  Type of Debridement: manual, sharp debridement. Instrumentation: Nail nipper, rotary burr. Number of Nails: 10  Procedures and Treatment: Consent by  patient was obtained for treatment procedures. The patient understood the discussion of treatment and procedures well. All questions were answered thoroughly reviewed. Debridement of mycotic and hypertrophic toenails, 1 through 5 bilateral and clearing of subungual debris. No ulceration, no infection noted.  Return Visit-Office Procedure: Patient instructed to return to the office for a follow up visit 3 months for continued evaluation and treatment.  Boneta Lucks, DPM    No follow-ups on file.     No follow-ups on file.

## 2023-03-30 ENCOUNTER — Other Ambulatory Visit: Payer: Self-pay | Admitting: Family Medicine

## 2023-04-18 ENCOUNTER — Ambulatory Visit (INDEPENDENT_AMBULATORY_CARE_PROVIDER_SITE_OTHER): Payer: Medicare Other | Admitting: Obstetrics & Gynecology

## 2023-04-18 ENCOUNTER — Encounter: Payer: Self-pay | Admitting: Obstetrics & Gynecology

## 2023-04-18 VITALS — BP 130/80 | Ht 64.0 in | Wt 141.0 lb

## 2023-04-18 DIAGNOSIS — L900 Lichen sclerosus et atrophicus: Secondary | ICD-10-CM

## 2023-04-18 DIAGNOSIS — N905 Atrophy of vulva: Secondary | ICD-10-CM

## 2023-04-18 MED ORDER — ESTROGENS CONJUGATED 0.625 MG/GM VA CREA: TOPICAL_CREAM | VAGINAL | 12 refills | Status: DC

## 2023-04-18 NOTE — Progress Notes (Signed)
   Established Patient Office Visit  Subjective   Patient ID: Renee [name], female    DOB: 02-Aug-1946  Age: 77 y.o. MRN: [id number]  Chief Complaint  Patient presents with   Follow-up    HPI   Patient is a 77 y.o. divorced [id number] female who presents for follow up on lichen sclerosis of the vulva. She reports no new concerns with the lichen sclerosis. She continues to use the Clobetasol cream to treat symptoms, on a prn basis, uses it several days per week. She believes that it has improved since her last visit.  Objective:     BP 130/80   Ht  (1.626 m)   Wt 141 lb (64 kg)   BMI 24.20 kg/m    Physical Exam   Well nourished, well hydrated White female, no apparent distress She is ambulating and conversing normally. 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 Marked improvement in the effects of the skin from urine  Assessment & Plan:   Problem List Items Addressed This Visit   None Visit Diagnoses     Lichen sclerosus et atrophicus    -  Primary      She will continue to use the temovate several days per week  VVA- I will prescribed premarin cream to be used 2 nights per week She will come back in 6 months/prn sooner    Allie Bossier, MD

## 2023-04-25 ENCOUNTER — Other Ambulatory Visit: Payer: Self-pay

## 2023-04-25 MED ORDER — ESTRADIOL 0.1 MG/GM VA CREA: 1.0000 | TOPICAL_CREAM | VAGINAL | 12 refills | Status: DC

## 2023-05-05 DIAGNOSIS — L089 Local infection of the skin and subcutaneous tissue, unspecified: Secondary | ICD-10-CM | POA: Diagnosis not present

## 2023-05-05 DIAGNOSIS — L0103 Bullous impetigo: Secondary | ICD-10-CM | POA: Diagnosis not present

## 2023-05-17 ENCOUNTER — Telehealth: Payer: Self-pay | Admitting: *Deleted

## 2023-05-17 DIAGNOSIS — E559 Vitamin D deficiency, unspecified: Secondary | ICD-10-CM

## 2023-05-17 DIAGNOSIS — E1169 Type 2 diabetes mellitus with other specified complication: Secondary | ICD-10-CM

## 2023-05-17 DIAGNOSIS — E1159 Type 2 diabetes mellitus with other circulatory complications: Secondary | ICD-10-CM

## 2023-05-17 NOTE — Telephone Encounter (Signed)
-----   Message from Alvina Chou sent at 05/16/2023  3:29 PM EDT ----- Regarding: Lab orders for Wednesday, 6.5.24 Patient is scheduled for CPX labs, please order future labs, Thanks , Camelia Eng

## 2023-06-01 ENCOUNTER — Other Ambulatory Visit (INDEPENDENT_AMBULATORY_CARE_PROVIDER_SITE_OTHER): Payer: Medicare Other

## 2023-06-01 DIAGNOSIS — E1159 Type 2 diabetes mellitus with other circulatory complications: Secondary | ICD-10-CM

## 2023-06-01 DIAGNOSIS — E1169 Type 2 diabetes mellitus with other specified complication: Secondary | ICD-10-CM

## 2023-06-01 DIAGNOSIS — E559 Vitamin D deficiency, unspecified: Secondary | ICD-10-CM

## 2023-06-01 DIAGNOSIS — E785 Hyperlipidemia, unspecified: Secondary | ICD-10-CM

## 2023-06-01 LAB — COMPREHENSIVE METABOLIC PANEL
ALT: 14 U/L (ref 0–35)
AST: 22 U/L (ref 0–37)
Albumin: 4.3 g/dL (ref 3.5–5.2)
Alkaline Phosphatase: 53 U/L (ref 39–117)
BUN: 11 mg/dL (ref 6–23)
CO2: 24 mEq/L (ref 19–32)
Calcium: 9.8 mg/dL (ref 8.4–10.5)
Chloride: 104 mEq/L (ref 96–112)
Creatinine, Ser: 0.67 mg/dL (ref 0.40–1.20)
GFR: 83.53 mL/min (ref >60.00)
Glucose, Bld: 102 mg/dL — ABNORMAL HIGH (ref 70–99)
Potassium: 4.3 mEq/L (ref 3.5–5.1)
Sodium: 140 mEq/L (ref 135–145)
Total Bilirubin: 0.3 mg/dL (ref 0.2–1.2)
Total Protein: 7.7 g/dL (ref 6.0–8.3)

## 2023-06-01 LAB — LIPID PANEL
Cholesterol: 179 mg/dL (ref 0–200)
HDL: 44.5 mg/dL (ref >39.00)
NonHDL: 134.15
Total CHOL/HDL Ratio: 4
Triglycerides: 210 mg/dL — ABNORMAL HIGH (ref 0.0–149.0)
VLDL: 42 mg/dL — ABNORMAL HIGH (ref 0.0–40.0)

## 2023-06-01 LAB — HEMOGLOBIN A1C: Hgb A1c MFr Bld: 6.3 % (ref 4.6–6.5)

## 2023-06-01 LAB — VITAMIN D 25 HYDROXY (VIT D DEFICIENCY, FRACTURES): VITD: 52.43 ng/mL (ref 30.00–100.00)

## 2023-06-01 LAB — LDL CHOLESTEROL, DIRECT: Direct LDL: 121 mg/dL

## 2023-06-02 DIAGNOSIS — H0014 Chalazion left upper eyelid: Secondary | ICD-10-CM | POA: Diagnosis not present

## 2023-06-02 DIAGNOSIS — L819 Disorder of pigmentation, unspecified: Secondary | ICD-10-CM | POA: Diagnosis not present

## 2023-06-02 DIAGNOSIS — E119 Type 2 diabetes mellitus without complications: Secondary | ICD-10-CM | POA: Diagnosis not present

## 2023-06-02 DIAGNOSIS — M3501 Sicca syndrome with keratoconjunctivitis: Secondary | ICD-10-CM | POA: Diagnosis not present

## 2023-06-02 LAB — HM DIABETES EYE EXAM

## 2023-06-02 NOTE — Progress Notes (Signed)
No critical labs need to be addressed urgently. We will discuss labs in detail at upcoming office visit.   

## 2023-06-08 ENCOUNTER — Ambulatory Visit (INDEPENDENT_AMBULATORY_CARE_PROVIDER_SITE_OTHER): Payer: Medicare Other | Admitting: Family Medicine

## 2023-06-08 ENCOUNTER — Telehealth: Payer: Self-pay | Admitting: Pharmacist

## 2023-06-08 ENCOUNTER — Encounter: Payer: Self-pay | Admitting: Family Medicine

## 2023-06-08 ENCOUNTER — Other Ambulatory Visit: Payer: Self-pay | Admitting: Family Medicine

## 2023-06-08 VITALS — BP 124/80 | HR 73 | Temp 97.6°F | Ht 64.0 in | Wt 140.0 lb

## 2023-06-08 DIAGNOSIS — D692 Other nonthrombocytopenic purpura: Secondary | ICD-10-CM

## 2023-06-08 DIAGNOSIS — E039 Hypothyroidism, unspecified: Secondary | ICD-10-CM

## 2023-06-08 DIAGNOSIS — E1159 Type 2 diabetes mellitus with other circulatory complications: Secondary | ICD-10-CM

## 2023-06-08 DIAGNOSIS — E785 Hyperlipidemia, unspecified: Secondary | ICD-10-CM | POA: Diagnosis not present

## 2023-06-08 DIAGNOSIS — Z7985 Long-term (current) use of injectable non-insulin antidiabetic drugs: Secondary | ICD-10-CM | POA: Diagnosis not present

## 2023-06-08 DIAGNOSIS — G720 Drug-induced myopathy: Secondary | ICD-10-CM

## 2023-06-08 DIAGNOSIS — T466X5A Adverse effect of antihyperlipidemic and antiarteriosclerotic drugs, initial encounter: Secondary | ICD-10-CM | POA: Diagnosis not present

## 2023-06-08 DIAGNOSIS — I152 Hypertension secondary to endocrine disorders: Secondary | ICD-10-CM

## 2023-06-08 DIAGNOSIS — E1169 Type 2 diabetes mellitus with other specified complication: Secondary | ICD-10-CM

## 2023-06-08 LAB — HM DIABETES FOOT EXAM

## 2023-06-08 MED ORDER — TRULICITY 0.75 MG/0.5ML ~~LOC~~ SOAJ
0.7500 mg | SUBCUTANEOUS | 1 refills | Status: DC
Start: 2023-06-08 — End: 2023-12-12

## 2023-06-08 MED ORDER — ONETOUCH VERIO VI STRP: ORAL_STRIP | 11 refills | Status: DC

## 2023-06-08 NOTE — Patient Instructions (Signed)
Please stop at the lab to have labs drawn.  

## 2023-06-08 NOTE — Progress Notes (Signed)
Patient ID: Renee [name], female    DOB: 1946/07/11, 77 y.o.   MRN: [id number]  This visit was conducted in person.  BP 124/80 (BP Location: Left Arm, Patient Position: Sitting, Cuff Size: Normal)   Pulse 73   Temp 97.6 F (36.4 C) (Temporal)   Ht 5\' 4"  (1.626 m)   Wt 140 lb (63.5 kg)   SpO2 96%   BMI 24.03 kg/m    CC:  Chief Complaint  Patient presents with   Diabetes    Labs already done    Subjective:   HPI: Renee [name] is a 77 y.o. female presenting on 06/08/2023 for Diabetes (Labs already done)  The patient presents for  complete physical and review of chronic health problems. He/She also has the following acute concerns today: none  Hypertension:    Good control.  losartan 50 mg daily BP Readings from Last 3 Encounters:  06/08/23 124/80  04/18/23 130/80  01/17/23 (!) 180/89  Using medication without problems or lightheadedness:  Chest pain with exertion:none Edema:none Short of breath:none Average home BPs: not checking lately 131/70 in Nov Other issues:  Elevated Cholesterol: Chronic,inadequate control.She continues Zetia 10 mg p.o. daily.  She is intolerant of statins given myopathy. Lab Results  Component Value Date   CHOL 179 06/01/2023   HDL 44.50 06/01/2023   LDLCALC 106 (H) 11/29/2022   LDLDIRECT 121.0 06/01/2023   TRIG 210.0 (H) 06/01/2023   CHOLHDL 4 06/01/2023  Using medications without problems: Muscle aches:  Diet compliance: heart healthy Exercise:  minimal Other complaints:  Diabetes:  well controlled on Trulicity 0.75 mg weekly  Lab Results  Component Value Date   HGBA1C 6.3 06/01/2023  Using medications without difficulties: Hypoglycemic episodes: Hyperglycemic episodes: Feet problems: no ulcer Blood Sugars averaging: 101-124, average 110 eye exam within last year: yes  Hypothyroid stable control last check on levothyroxine 75 mcg daily. Due for re-eval. Lab Results  Component Value Date   TSH 2.49 05/31/2022   MDD,GAD:  tolerable control on no medication.     Relevant past medical, surgical, family and social history reviewed and updated as indicated. Interim medical history since our last visit reviewed. Allergies and medications reviewed and updated. Outpatient Medications Prior to Visit  Medication Sig Dispense Refill   acetaminophen (TYLENOL) 650 MG CR tablet Take 1,300 mg by mouth every 8 (eight) hours as needed for pain.     aspirin EC 81 MG tablet Take 81 mg by mouth daily.     Blood Glucose Monitoring Suppl (ONETOUCH VERIO) w/Device KIT by Does not apply route. Use to check fasting blood sugar daily and an occasional 2 hr postprandial     Carboxymethylcellulose Sodium (THERATEARS OP) Place 1 drop into both eyes at bedtime as needed (dry eyes).     clobetasol ointment (TEMOVATE) 0.05 % APPLY TOPICALLY DAILY AS NEEDED FOR PSORIASIS FLARES 60 g 0   Coenzyme Q10 (COQ10) 200 MG CAPS Take 200 mg by mouth daily.     conjugated estrogens (PREMARIN) vaginal cream Apply to vulva 2 nights per week at bedtime 42.5 g 12   Dulaglutide (TRULICITY) 0.75 MG/0.5ML SOPN Inject 0.75 mg into the skin once a week. Via [employer] PAP     estradiol (ESTRACE VAGINAL) 0.1 MG/GM vaginal cream Place 1 Applicatorful vaginally 2 (two) times a week. At bedtime 42.5 g 12   ezetimibe (ZETIA) 10 MG tablet TAKE 1 TABLET BY MOUTH DAILY 90 tablet 3   famotidine (PEPCID) 20 MG tablet  Take 20 mg by mouth daily as needed for heartburn or indigestion.     Flaxseed, Linseed, (FLAX SEED OIL PO) Take 1 capsule by mouth daily.     fluticasone (FLONASE) 50 MCG/ACT nasal spray Place 1 spray into both nostrils daily as needed for allergies.     Lancets (ONETOUCH ULTRASOFT) lancets Use to check fasting blood sugar daily and an occasional 2 hr postprandial 100 each 11   levothyroxine (SYNTHROID) 75 MCG tablet TAKE 1 TABLET BY MOUTH EVERY DAY 90 tablet 1   losartan (COZAAR) 50 MG tablet TAKE 1 TABLET BY MOUTH EVERY DAY 90 tablet 1   MAGNESIUM PO  Take 1 tablet by mouth daily.     meloxicam (MOBIC) 7.5 MG tablet Take 1 tablet by mouth daily as needed.     Multiple Vitamin (MULTIVITAMIN) tablet Take 1 tablet by mouth daily.     nitroGLYCERIN (NITROSTAT) 0.4 MG SL tablet Place 1 tablet (0.4 mg total) under the tongue every 5 (five) minutes as needed. 25 tablet 1   ONETOUCH VERIO test strip USE TO CHECK FASTING BLOOD SUGAR DAILY AND AN OCCASIONAL 2 HR POSTPRANDIAL. 100 strip 11   Skin Protectants, Misc. (EUCERIN) cream Apply 1 application  topically as needed (eczema).     triamcinolone cream (KENALOG) 0.1 % Apply 1 application topically 2 (two) times daily as needed (psoriasis). 454 g 0   Vitamin D, Cholecalciferol, 25 MCG (1000 UT) CAPS Take 2,000 Units by mouth daily.     Emollient (CERAVE) CREA Apply 1 application  topically 2 (two) times daily as needed (psoriasis).     HYDROcodone-acetaminophen (NORCO/VICODIN) 5-325 MG tablet Take 1 tablet by mouth every 4 (four) hours as needed for moderate pain. 20 tablet 0   ondansetron (ZOFRAN) 4 MG tablet Take 1 tablet (4 mg total) by mouth every 8 (eight) hours as needed for nausea or vomiting. 10 tablet 0   No facility-administered medications prior to visit.     Per HPI unless specifically indicated in ROS section below Review of Systems  Constitutional:  Negative for fatigue and fever.  HENT:  Negative for congestion.   Eyes:  Negative for pain.  Respiratory:  Negative for cough and shortness of breath.   Cardiovascular:  Negative for chest pain, palpitations and leg swelling.  Gastrointestinal:  Negative for abdominal pain.  Genitourinary:  Negative for dysuria and vaginal bleeding.  Musculoskeletal:  Negative for back pain.  Neurological:  Negative for syncope, light-headedness and headaches.  Psychiatric/Behavioral:  Negative for dysphoric mood.    Objective:  BP 124/80 (BP Location: Left Arm, Patient Position: Sitting, Cuff Size: Normal)   Pulse 73   Temp 97.6 F (36.4 C)  (Temporal)   Ht 5\' 4"  (1.626 m)   Wt 140 lb (63.5 kg)   SpO2 96%   BMI 24.03 kg/m   Wt Readings from Last 3 Encounters:  06/08/23 140 lb (63.5 kg)  04/18/23 141 lb (64 kg)  01/17/23 141 lb (64 kg)      Physical Exam Constitutional:      General: She is not in acute distress.    Appearance: Normal appearance. She is well-developed. She is not ill-appearing or toxic-appearing.  HENT:     Head: Normocephalic.     Right Ear: Hearing, tympanic membrane, ear canal and external ear normal. Tympanic membrane is not erythematous, retracted or bulging.     Left Ear: Hearing, tympanic membrane, ear canal and external ear normal. Tympanic membrane is not erythematous, retracted or bulging.  Nose: No mucosal edema or rhinorrhea.     Right Sinus: No maxillary sinus tenderness or frontal sinus tenderness.     Left Sinus: No maxillary sinus tenderness or frontal sinus tenderness.     Mouth/Throat:     Pharynx: Uvula midline.  Eyes:     General: Lids are normal. Lids are everted, no foreign bodies appreciated.     Conjunctiva/sclera: Conjunctivae normal.     Pupils: Pupils are equal, round, and reactive to light.  Neck:     Thyroid: No thyroid mass or thyromegaly.     Vascular: No carotid bruit.     Trachea: Trachea normal.  Cardiovascular:     Rate and Rhythm: Normal rate and regular rhythm.     Pulses: Normal pulses.     Heart sounds: Normal heart sounds, S1 normal and S2 normal. No murmur heard.    No friction rub. No gallop.  Pulmonary:     Effort: Pulmonary effort is normal. No tachypnea or respiratory distress.     Breath sounds: Normal breath sounds. No decreased breath sounds, wheezing, rhonchi or rales.  Abdominal:     General: Bowel sounds are normal.     Palpations: Abdomen is soft.     Tenderness: There is no abdominal tenderness.  Musculoskeletal:     Cervical back: Normal range of motion and neck supple.  Skin:    General: Skin is warm and dry.     Findings: No rash.   Neurological:     Mental Status: She is alert.  Psychiatric:        Mood and Affect: Mood is not anxious or depressed.        Speech: Speech normal.        Behavior: Behavior normal. Behavior is cooperative.        Thought Content: Thought content normal.        Judgment: Judgment normal.   Diabetic foot exam: Normal inspection No skin breakdown No calluses  Normal DP pulses Normal sensation to light touch and monofilament Nails normal     Results for orders placed or performed in visit on 06/08/23  HM DIABETES FOOT EXAM  Result Value Ref Range   HM Diabetic Foot Exam done      COVID 19 screen:  No recent travel or known exposure to COVID19 The patient denies respiratory symptoms of COVID 19 at this time. The importance of social distancing was discussed today.   Assessment and Plan   Problem List Items Addressed This Visit     Adult hypothyroidism    Stable, chronic.  Continue current medication.  stable control last check on levothyroxine 75 mcg daily.. due for re-eval.      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  No side effects        Hyperlipidemia associated with type 2 diabetes mellitus (HCC)    Chronic, worsening control in the last 3 to 6 months.   She continues Zetia 10 mg p.o. daily.  She is intolerant of statins given myopathy.      Hypertension associated with diabetes (HCC) - Primary    Stable, chronic.  Continue current medication.  Losartan 50 mg p.o. daily      Senile purpura (HCC)     Discussed changes on hands and legs. NO concerns.      Statin myopathy    Intolerant of statin medications due  to severe myalgia        Kerby Nora, MD

## 2023-06-08 NOTE — Assessment & Plan Note (Addendum)
Stable, chronic.  Continue current medication.  Losartan 50 mg p.o. daily 

## 2023-06-08 NOTE — Assessment & Plan Note (Addendum)
Stable, chronic.  Continue current medication.  stable control last check on levothyroxine 75 mcg daily.. due for re-eval.

## 2023-06-08 NOTE — Assessment & Plan Note (Signed)
Intolerant of statin medications due to severe myalgia

## 2023-06-08 NOTE — Assessment & Plan Note (Signed)
Discussed changes on hands and legs. NO concerns.

## 2023-06-08 NOTE — Telephone Encounter (Signed)
Patient reports she needs refills of Trulicity through [employer]. She was approved for [employer] PAP in January 2024 for the whole year. Ordered refills to [facility] pharmacy in case they need refills.  We will contact [employer] to make sure they will process refills.

## 2023-06-08 NOTE — Assessment & Plan Note (Signed)
Chronic, associated with hypertension Well-controlled on Trulicity 0.75 mg weekly.  No side effects    

## 2023-06-08 NOTE — Assessment & Plan Note (Signed)
Chronic, worsening control in the last 3 to 6 months.  She continues Zetia 10 mg p.o. daily.  She is intolerant of statins given myopathy. 

## 2023-06-17 DIAGNOSIS — D485 Neoplasm of uncertain behavior of skin: Secondary | ICD-10-CM | POA: Diagnosis not present

## 2023-06-20 ENCOUNTER — Encounter: Payer: Self-pay | Admitting: Neurology

## 2023-06-20 ENCOUNTER — Ambulatory Visit (INDEPENDENT_AMBULATORY_CARE_PROVIDER_SITE_OTHER): Payer: Medicare Other | Admitting: Neurology

## 2023-06-20 VITALS — BP 148/72 | HR 74 | Ht 63.0 in | Wt 142.0 lb

## 2023-06-20 DIAGNOSIS — E782 Mixed hyperlipidemia: Secondary | ICD-10-CM | POA: Diagnosis not present

## 2023-06-20 DIAGNOSIS — Z789 Other specified health status: Secondary | ICD-10-CM

## 2023-06-20 DIAGNOSIS — Z8673 Personal history of transient ischemic attack (TIA), and cerebral infarction without residual deficits: Secondary | ICD-10-CM | POA: Diagnosis not present

## 2023-06-20 DIAGNOSIS — G3184 Mild cognitive impairment, so stated: Secondary | ICD-10-CM

## 2023-06-20 MED ORDER — LEQVIO 284 MG/1.5ML ~~LOC~~ SOSY: 284.0000 mg | PREFILLED_SYRINGE | Freq: Once | SUBCUTANEOUS | 0 refills | Status: AC

## 2023-06-20 NOTE — Progress Notes (Signed)
[facility] [address] [place]. [place] [address] [phone]       OFFICE FOLLOW UP VISIT NOTE  [name] Date of Birth:  [birth year] Medical Record Number:  [id number]   Referring MD:  Renee [name]  Reason for Referral:  TIA  Chief Complaint  Patient presents with   Follow-up    Rm 20 with daughter Renee [name]  Pt is well and stable, no new TIA or cognitive concerns since last visit        HPI:   Initial visit 06/25/2021 Renee [name] is a pleasant 77 year old Caucasian lady who is referred today for initial office consultation visit for TIA.  History is obtained from the patient, her daughter as well as review of electronic medical records and I personally reviewed pertinent imaging films in PACS.  She has a past medical history of coronary artery disease, diabetes, hypertension, hyperlipidemia, hypothyroidism and arthritis.  She states she had episode of sudden onset of blurred vision and dark vision when she woke up from sleep on 06/17/2021.  She was able to see but vision was not clear.  She checked her blood sugar which was fine.  She also noticed that she has some trouble thinking straight and trouble speaking.  She went to a restaurant where she had trouble ordering her food eventually she was able to place an order.  She subsequently had a meal and sat in a car and while driving to the gynecologist office noticed numbness in the right hand from the wrist down.  She also felt that her right cheek of her cheek was being pulled to the side.  When she continues with annual gynecology she advised her to go to the ER.  Her symptoms are completely resolved by the time she reached there.  Patient denied any accompanying headache, double vision, vertigo, gait or balance problems.  Patient said she had a somewhat similar episode a couple of years ago when she also had some difficulty with vision, focusing as well as trouble thinking and word finding which also  did not stay long.  She did not have a headache after that.  She did not seek medical help at that time.  Patient does have a history of remote migraines but she states she is outgrown them and has not had one for years.  2 of her daughters also have migraines.  She denies any other prior history of strokes or TIAs or seizures.  She had a CT scan of the head on 06/17/2021 which showed possibly a right basal ganglia lacunar however an MRI scan done the next day showed no evidence of acute stroke or old strokes and only showed age-related atrophy and white matter changes.  Lab work on 04/27/2021 showed hemoglobin A1c of 7.3 and LDL cholesterol 140 mg percent.  Patient also has complaints of headache with posterior neck pain with tenderness in the left occipital groove.  She also complains of muscle aches and pains and in fact has an appointment soon to see rheumatologist Dr. Corliss Skains for these complaints.Marland Kitchen Update 09/16/2021 Dr. Pearlean [name]: She returns for follow-up after last visit 2 and half months ago.  She is accompanied by her daughter Renee [name].  Patient continues to have memory difficulties mostly for short-term.  She still lives independently and manages most of her activities of daily living though her daughters live nearby and provide support.  She has had no further episodes of TIA or blurred vision no complicated migraine episodes.  Main complaint today is chronic right shoulder pain which limits her range of motion.  She has gotten some injection of steroids for chronic pain that has not helped.  She is currently doing some home therapy.  She had recent lab work on 08/24/2021 which showed borderline hemoglobin A1c of 6.6 and LDL cholesterol of 84 mg percent.  She remains on Zetia and previously has been intolerant to Crestor and Zocor.  She had CT angiogram of the brain and neck done on 07/17/2021 which showed no significant large vessel intracranial or extracranial stenosis.  There was 1 to 2 mm paraclinoid left ICA  infundibulum versus tiny aneurysm noted.  Temporal artery ultrasound on 07/16/2021 was negative for halo sign. Update 03/16/2022 Dr. Pearlean [name]; she returns for follow-up after last visit 6 months ago.  She is accompanied by her daughter.  Is doing well.  She continues to live alone and is independent in actives of daily living.  She is still driving.  She does not finances except online billing which her daughter handles.  She continues to have short-term memory difficulties but these are nonprogressive and unchanged.  She does write things down and uses a calendar.  She does not do many activities like solving puzzles or sudoku as she does not like it.  She has not had any stroke or TIA symptoms.  She remains on aspirin she is tolerating well without bruising or bleeding.  Her blood pressure is under good control today it is 138/70.  She states her sugars are doing pretty good.  She saw her primary care physician and all lab work is satisfactory.  On Mini-Mental status testing by my assistant today she scored 21/30 but probably would not have a good time is on my testing she scored recall 2/3 and able to name 14 animals will can walk on 4 legs and clock drawing she scored normal 4/4   Update 11/29/2022 JM: Reports overall stable since prior visit accompanied by her daughters (one in office, one via video).  Denies any new or reoccurring stroke/TIA symptoms since prior visit  Reports cognition has been about the same since prior visit. Lives alone, able to maintain ADLs and IADLs independently. Continues to drive, no issues with this. Appears to do well with compensation strategies. Remains on aspirin and Zetia.  Blood pressure well controlled, elevated today but has had a busy day with appointments, occasionally monitors at home and typically stable.   Update 06/20/2023 : She returns for follow-up after last visit 6 months ago with Renee [name], nurse practitioner.  Patient is doing well.  She is still living alone and is  independent.  She drives and pays her bills.  He denies any recurrent TIA or stroke symptoms.  She remains on aspirin which is tolerating well with minor bruising but no bleeding.  Blood pressure is under good control.  Remains on Zetia for her lipids as she has statin intolerance.  Last lipid profile on 06/01/2023 showed LDL cholesterol to be suboptimally controlled at 121 mg percent.  She has not yet tried Repatha or Praluent injections or Leqvio.  She has no new complaints today.  Mini-Mental status score today 24/30 which is stable from last visit     ROS:   14 system review of systems is positive for those listed in HPI and neck pain and all other systems negative  PMH:  Past Medical History:  Diagnosis Date   Allergy    Anxiety    Arthritis  CAD (coronary artery disease)    non-obstructive. cath 11/11   Claustrophobia    Diabetes mellitus    borderline diabetic - diet controlled    GERD (gastroesophageal reflux disease)    History of chicken pox    History of phobia    clastrophobia   Hyperlipidemia    Hypertension    Hypothyroidism    Kidney stones    hx of    Lichen sclerosus    Psoriasis    Stroke (HCC)    Urinary tract infection    hx of     Social History:  Social History   Socioeconomic History   Marital status: Divorced    Spouse name: Not on file   Number of children: 6   Years of education: Not on file   Highest education level: Not on file  Occupational History   Occupation: Retired  Tobacco Use   Smoking status: Never   Smokeless tobacco: Never  Vaping Use   Vaping Use: Never used  Substance and Sexual Activity   Alcohol use: Yes    Comment: rare   Drug use: No   Sexual activity: Not Currently    Birth control/protection: Post-menopausal  Other Topics Concern   Not on file  Social History Narrative   Lives alone   Right Handed   Drink 1 cups caffeine daily   Social Determinants of Health   Financial Resource Strain: Low Risk   (12/13/2022)   Overall Financial Resource Strain (CARDIA)    Difficulty of Paying Living Expenses: Not very hard  Food Insecurity: No Food Insecurity (12/13/2022)   Hunger Vital Sign    Worried About Running Out of Food in the Last Year: Never true    Ran Out of Food in the Last Year: Never true  Transportation Needs: No Transportation Needs (12/13/2022)   PRAPARE - Administrator, Civil Service (Medical): No    Lack of Transportation (Non-Medical): No  Physical Activity: Sufficiently Active (12/13/2022)   Exercise Vital Sign    Days of Exercise per Week: 3 days    Minutes of Exercise per Session: 60 min  Stress: No Stress Concern Present (12/13/2022)   [employer] of Occupational Health - Occupational Stress Questionnaire    Feeling of Stress : Not at all  Social Connections: Moderately Isolated (12/13/2022)   Social Connection and Isolation Panel [NHANES]    Frequency of Communication with Friends and Family: More than three times a week    Frequency of Social Gatherings with Friends and Family: More than three times a week    Attends Religious Services: 1 to 4 times per year    Active Member of [employer] or Organizations: No    Attends Banker Meetings: Never    Marital Status: Divorced  Catering manager Violence: Not At Risk (12/13/2022)   Humiliation, Afraid, Rape, and Kick questionnaire    Fear of Current or Ex-Partner: No    Emotionally Abused: No    Physically Abused: No    Sexually Abused: No    Medications:   Current Outpatient Medications on File Prior to Visit  Medication Sig Dispense Refill   acetaminophen (TYLENOL) 650 MG CR tablet Take 1,300 mg by mouth every 8 (eight) hours as needed for pain.     aspirin EC 81 MG tablet Take 81 mg by mouth daily.     Carboxymethylcellulose Sodium (THERATEARS OP) Place 1 drop into both eyes at bedtime as needed (dry eyes).  clobetasol ointment (TEMOVATE) 0.05 % APPLY TOPICALLY DAILY AS NEEDED FOR  PSORIASIS FLARES 60 g 0   Coenzyme Q10 (COQ10) 200 MG CAPS Take 200 mg by mouth daily.     Dulaglutide (TRULICITY) 0.75 MG/0.5ML SOPN Inject 0.75 mg into the skin once a week. Via Lilly Cares PAP 8 mL 1   ezetimibe (ZETIA) 10 MG tablet TAKE 1 TABLET BY MOUTH DAILY 90 tablet 3   famotidine (PEPCID) 20 MG tablet Take 20 mg by mouth daily as needed for heartburn or indigestion.     Flaxseed, Linseed, (FLAX SEED OIL PO) Take 1 capsule by mouth daily.     fluticasone (FLONASE) 50 MCG/ACT nasal spray Place 1 spray into both nostrils daily as needed for allergies.     levothyroxine (SYNTHROID) 75 MCG tablet TAKE 1 TABLET BY MOUTH EVERY DAY 90 tablet 1   losartan (COZAAR) 50 MG tablet TAKE 1 TABLET BY MOUTH EVERY DAY 90 tablet 1   MAGNESIUM PO Take 1 tablet by mouth daily.     meloxicam (MOBIC) 7.5 MG tablet Take 1 tablet by mouth daily as needed.     Multiple Vitamin (MULTIVITAMIN) tablet Take 1 tablet by mouth daily.     Skin Protectants, Misc. (EUCERIN) cream Apply 1 application  topically as needed (eczema).     triamcinolone cream (KENALOG) 0.1 % Apply 1 application topically 2 (two) times daily as needed (psoriasis). 454 g 0   Vitamin D, Cholecalciferol, 25 MCG (1000 UT) CAPS Take 2,000 Units by mouth daily.     No current facility-administered medications on file prior to visit.    Allergies:   Allergies  Allergen Reactions   Celexa [Citalopram] Shortness Of Breath and Swelling    Throat swelling and difficulty breathing   Other Rash    Metal    Codeine Nausea And Vomiting   Metformin Nausea And Vomiting   Robaxin [Methocarbamol] Nausea Only   Tizanidine Other (See Comments)    Oversedated   Crestor [Rosuvastatin] Rash   Nickel Rash   Simvastatin Swelling and Rash       Physical Exam Today's Vitals   06/20/23 1442  BP: (!) 148/72  Pulse: 74  Weight: 142 lb (64.4 kg)  Height: 5\' 3"  (1.6 m)   Body mass index is 25.15 kg/m.  General: well developed, well nourished very  pleasant elderly Caucasian lady, seated, in no evident distress Head: head normocephalic and atraumatic.   Neck: supple with no carotid or supraclavicular bruits Cardiovascular: regular rate and rhythm, no murmurs Musculoskeletal: no deformity  Skin:  no rash/petichiae Vascular:  Normal pulses all extremities   Neurologic Exam Mental Status: Awake and fully alert. Oriented to place and time. Recent memory impaired and remote memory intact. Attention span, concentration and fund of knowledge appropriate. Mood and affect appropriate.  Recall.  Clock drawing 3/4.  Able to name only 7 animals which can walk on 4 legs. Cranial Nerves: Fundoscopic exam reveals sharp disc margins. Pupils equal, briskly reactive to light. Extraocular movements full without nystagmus. Visual fields full to confrontation. Hearing mildly diminished bilaterally.. Facial sensation intact. Face, tongue, palate moves normally and symmetrically.  Motor: Normal bulk and tone. Normal strength in all tested extremity muscles. Sensory.: intact to touch , pinprick , position and vibratory sensation.  Coordination: Rapid alternating movements normal in all extremities. Finger-to-nose and heel-to-shin performed accurately bilaterally. Gait and Station: Arises from chair without difficulty. Stance is normal. Gait demonstrates normal stride length and balance .  Tandem walking not  checked. Reflexes: 1+ and symmetric. Toes downgoing.       06/20/2023    2:51 PM 11/29/2022    3:41 PM 03/16/2022    1:05 PM  MMSE - Mini Mental State Exam  Orientation to time 5 4 5   Orientation to time comments  said winter instead fall   Orientation to Place 4 5 5   Registration 3 3 3   Attention/ Calculation 1 3 0  Recall 3 3 2   Language- name 2 objects 2 2 2   Language- repeat 1 0 0  Language- follow 3 step command 3 3 2   Language- read & follow direction 1 1 1   Write a sentence 1 1 1   Copy design 0 0 0  Total score 24 25 21          ASSESSMENT/PLAN: 77 year old pleasant Caucasian lady with transient episode of vision and cognitive difficulties, numbness possibly posterior circulation TIAs versus atypical migraine.  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 I also advised the patient to eat a healthy diet with plenty of whole grains, cereals, fruits and vegetables, exercise regularly and maintain ideal body weight .consider Leqvio injections for her hyperlipidemia as it is suboptimally controlled on Zetia and she has statin intolerance.  Encouraged her to increase participation in cognitively challenging activities like solving crossword puzzles, playing bridge and sudoku.  We also discussed memory compensation strategies.  Followup in the future with my nurse practitioner months or call earlier if necessary.  Greater than 50% time during this 35-minute visit were spent in counseling and coordination of care about her TIA and mild cognitive impairment and questions.  Delia Heady, MD  [facility] [address] [place], [place] [address]  Phone [phone] Fax [phone] Note: This document was prepared with digital dictation and possible smart phrase technology. Any transcriptional errors that result from this process are unintentional.

## 2023-06-20 NOTE — Patient Instructions (Signed)
I had a long d/w patient and her daughter about her remote TIA, hyperlipidemia and mild cognitive impairment, risk for recurrent stroke/TIAs, personally independently reviewed imaging studies and stroke evaluation results and answered questions.Continue aspirin 81 mg daily  for secondary stroke prevention and maintain strict control of hypertension with blood pressure goal below 130/90, diabetes with hemoglobin A1c goal below 6.5% and lipids with LDL cholesterol goal below 70 mg/dL. I also advised the patient to eat a healthy diet with plenty of whole grains, cereals, fruits and vegetables, exercise regularly and maintain ideal body weight .consider Leqvio injections for her hyperlipidemia as it is suboptimally controlled on Zetia and she has statin intolerance.  Encouraged her to increase participation in cognitively challenging activities like solving crossword puzzles, playing bridge and sudoku.  We also discussed memory compensation strategies.  Followup in the future with my nurse practitioner months or call earlier if necessary.  Memory Compensation Strategies  Use "WARM" strategy.  W= write it down  A= associate it  R= repeat it  M= make a mental note  2.   You can keep a Glass blower/designer.  Use a 3-ring notebook with sections for the following: calendar, important names and phone numbers,  medications, doctors' names/phone numbers, lists/reminders, and a section to journal what you did  each day.   3.    Use a calendar to write appointments down.  4.    Write yourself a schedule for the day.  This can be placed on the calendar or in a separate section of the Memory Notebook.  Keeping a  regular schedule can help memory.  5.    Use medication organizer with sections for each day or morning/evening pills.  You may need help loading it  6.    Keep a basket, or pegboard by the door.  Place items that you need to take out with you in the basket or on the pegboard.  You may also want to   include a message board for reminders.  7.    Use sticky notes.  Place sticky notes with reminders in a place where the task is performed.  For example: " turn off the  stove" placed by the stove, "lock the door" placed on the door at eye level, " take your medications" on  the bathroom mirror or by the place where you normally take your medications.  8.    Use alarms/timers.  Use while cooking to remind yourself to check on food or as a reminder to take your medicine, or as a  reminder to make a call, or as a reminder to perform another task, etc.  Inclisiran Injection What is this medication? INCLISIRAN (in kli SIR an) treats high cholesterol. It works by decreasing bad cholesterol (such as LDL) in your blood. Changes to diet and exercise are often combined with this medication. This medicine may be used for other purposes; ask your health care provider or pharmacist if you have questions. COMMON BRAND NAME(S): LEQVIO What should I tell my care team before I take this medication? They need to know if you have any of these conditions: An unusual or allergic reaction to inclisiran, other medications, foods, dyes, or preservatives Pregnant or trying to get pregnant Breast-feeding How should I use this medication? This medication is injected under the skin. It is given by your care team in a hospital or clinic setting. Talk to your care team about the use of this medication in children. Special care may be needed.  Overdosage: If you think you have taken too much of this medicine contact a poison control center or emergency room at once. NOTE: This medicine is only for you. Do not share this medicine with others. What if I miss a dose? Keep appointments for follow-up doses. It is important not to miss your dose. Call your care team if you are unable to keep an appointment. What may interact with this medication? Interactions are not expected. This list may not describe all possible  interactions. Give your health care provider a list of all the medicines, herbs, non-prescription drugs, or dietary supplements you use. Also tell them if you smoke, drink alcohol, or use illegal drugs. Some items may interact with your medicine. What should I watch for while using this medication? Visit your care team for regular checks on your progress. Tell your care team if your symptoms do not start to get better or if they get worse. You may need blood work while you are taking this medication. What side effects may I notice from receiving this medication? Side effects that you should report to your care team as soon as possible: Allergic reactions--skin rash, itching, hives, swelling of the face, lips, tongue, or throat Side effects that usually do not require medical attention (report these to your care team if they continue or are bothersome): Joint pain Pain, redness, or irritation at injection site This list may not describe all possible side effects. Call your doctor for medical advice about side effects. You may report side effects to FDA at 1-800-FDA-1088. Where should I keep my medication? This medication is given in a hospital or clinic. It will not be stored at home. NOTE: This sheet is a summary. It may not cover all possible information. If you have questions about this medicine, talk to your doctor, pharmacist, or health care provider.  2024 Elsevier/Gold Standard (2023-02-20 00:00:00)

## 2023-06-21 ENCOUNTER — Telehealth: Payer: Self-pay | Admitting: Neurology

## 2023-06-21 NOTE — Telephone Encounter (Signed)
Completed the Leqvio start form and faxed In for the patient. Received confirmation that it went through.

## 2023-06-23 ENCOUNTER — Telehealth: Payer: Self-pay | Admitting: Pharmacy Technician

## 2023-06-23 ENCOUNTER — Other Ambulatory Visit: Payer: Self-pay

## 2023-06-23 NOTE — Telephone Encounter (Signed)
Called pt's daughter Babette Relic per DPR and provided her with Arminda Resides telephone number [phone] from the [facility], she can ask her the questions she has regarding LeQvio medication. Pt had no additional questions at this time but was encouraged to call back if questions arise.

## 2023-06-23 NOTE — Telephone Encounter (Addendum)
Pt's daughter, Annice Needy ( on DPR)want to [facility]. Would like a call back at [phone].

## 2023-06-23 NOTE — Telephone Encounter (Signed)
Auth Submission: NO AUTH NEEDED Site of care: Site of care: [facility] Payer: MEDICARE A/B & HUMANA SUPP Medication & CPT/J Code(s) submitted: Leqvio (Inclisiran) J1306 Route of submission (phone, fax, portal):  Phone # Fax # Auth type: Buy/Bill Units/visits requested: X3 Reference number:  Approval from: 06/23/23 to 06/22/24

## 2023-06-29 ENCOUNTER — Other Ambulatory Visit: Payer: Self-pay | Admitting: Family Medicine

## 2023-06-29 DIAGNOSIS — E039 Hypothyroidism, unspecified: Secondary | ICD-10-CM

## 2023-06-29 NOTE — Telephone Encounter (Signed)
LVMTCB and sched

## 2023-06-29 NOTE — Telephone Encounter (Signed)
Please call and schedule lab only appointment to check thyroid labs. 

## 2023-06-29 NOTE — Telephone Encounter (Signed)
Patient scheduled for lab visit 7/9

## 2023-07-05 ENCOUNTER — Ambulatory Visit (INDEPENDENT_AMBULATORY_CARE_PROVIDER_SITE_OTHER): Payer: Medicare Other | Admitting: Podiatry

## 2023-07-05 ENCOUNTER — Other Ambulatory Visit (INDEPENDENT_AMBULATORY_CARE_PROVIDER_SITE_OTHER): Payer: Medicare Other

## 2023-07-05 DIAGNOSIS — Q828 Other specified congenital malformations of skin: Secondary | ICD-10-CM

## 2023-07-05 DIAGNOSIS — E039 Hypothyroidism, unspecified: Secondary | ICD-10-CM

## 2023-07-05 LAB — T3, FREE: T3, Free: 3.3 pg/mL (ref 2.3–4.2)

## 2023-07-05 LAB — TSH: TSH: 1.65 u[IU]/mL (ref 0.35–5.50)

## 2023-07-05 LAB — T4, FREE: Free T4: 0.93 ng/dL (ref 0.60–1.60)

## 2023-07-05 NOTE — Progress Notes (Signed)
Subjective:  Patient ID: Renee [name], female    DOB: 02-08-1946,  MRN: [id number]  Chief Complaint  Patient presents with   Callouses    77 y.o. female presents with the above complaint.  Patient presents with complaint of bilateral fifth metatarsal hyperkeratotic lesion.  Patient stated they are still very painful but the debridement does help.  She is in agreement thickened elongated dystrophic toenails x10 mild pain on palpation she would like for them to debride down to.   Review of Systems: Negative except as noted in the HPI. Denies N/V/F/Ch.  Past Medical History:  Diagnosis Date   Allergy    Anxiety    Arthritis    CAD (coronary artery disease)    non-obstructive. cath 11/11   Claustrophobia    Diabetes mellitus    borderline diabetic - diet controlled    GERD (gastroesophageal reflux disease)    History of chicken pox    History of phobia    clastrophobia   Hyperlipidemia    Hypertension    Hypothyroidism    Kidney stones    hx of    Lichen sclerosus    Psoriasis    Stroke (HCC)    Urinary tract infection    hx of     Current Outpatient Medications:    acetaminophen (TYLENOL) 650 MG CR tablet, Take 1,300 mg by mouth every 8 (eight) hours as needed for pain., Disp: , Rfl:    aspirin EC 81 MG tablet, Take 81 mg by mouth daily., Disp: , Rfl:    Carboxymethylcellulose Sodium (THERATEARS OP), Place 1 drop into both eyes at bedtime as needed (dry eyes)., Disp: , Rfl:    clobetasol ointment (TEMOVATE) 0.05 %, APPLY TOPICALLY DAILY AS NEEDED FOR PSORIASIS FLARES, Disp: 60 g, Rfl: 0   Coenzyme Q10 (COQ10) 200 MG CAPS, Take 200 mg by mouth daily., Disp: , Rfl:    Dulaglutide (TRULICITY) 0.75 MG/0.5ML SOPN, Inject 0.75 mg into the skin once a week. Via [employer] PAP, Disp: 8 mL, Rfl: 1   ezetimibe (ZETIA) 10 MG tablet, TAKE 1 TABLET BY MOUTH DAILY, Disp: 90 tablet, Rfl: 3   famotidine (PEPCID) 20 MG tablet, Take 20 mg by mouth daily as needed for heartburn or  indigestion., Disp: , Rfl:    Flaxseed, Linseed, (FLAX SEED OIL PO), Take 1 capsule by mouth daily., Disp: , Rfl:    fluticasone (FLONASE) 50 MCG/ACT nasal spray, Place 1 spray into both nostrils daily as needed for allergies., Disp: , Rfl:    levothyroxine (SYNTHROID) 75 MCG tablet, TAKE 1 TABLET BY MOUTH EVERY DAY, Disp: 90 tablet, Rfl: 0   losartan (COZAAR) 50 MG tablet, TAKE 1 TABLET BY MOUTH EVERY DAY, Disp: 90 tablet, Rfl: 1   MAGNESIUM PO, Take 1 tablet by mouth daily., Disp: , Rfl:    meloxicam (MOBIC) 7.5 MG tablet, Take 1 tablet by mouth daily as needed., Disp: , Rfl:    Multiple Vitamin (MULTIVITAMIN) tablet, Take 1 tablet by mouth daily., Disp: , Rfl:    Skin Protectants, Misc. (EUCERIN) cream, Apply 1 application  topically as needed (eczema)., Disp: , Rfl:    triamcinolone cream (KENALOG) 0.1 %, Apply 1 application topically 2 (two) times daily as needed (psoriasis)., Disp: 454 g, Rfl: 0   Vitamin D, Cholecalciferol, 25 MCG (1000 UT) CAPS, Take 2,000 Units by mouth daily., Disp: , Rfl:   Social History   Tobacco Use  Smoking Status Never  Smokeless Tobacco Never  Allergies  Allergen Reactions   Celexa [Citalopram] Shortness Of Breath and Swelling    Throat swelling and difficulty breathing   Other Rash    Metal    Codeine Nausea And Vomiting   Metformin Nausea And Vomiting   Robaxin [Methocarbamol] Nausea Only   Tizanidine Other (See Comments)    Oversedated   Crestor [Rosuvastatin] Rash   Nickel Rash   Simvastatin Swelling and Rash   Objective:  There were no vitals filed for this visit. There is no height or weight on file to calculate BMI. Constitutional Well developed. Well nourished.  Vascular Dorsalis pedis pulses palpable bilaterally. Posterior tibial pulses palpable bilaterally. Capillary refill normal to all digits.  No cyanosis or clubbing noted. Pedal hair growth normal.  Neurologic Normal speech. Oriented to person, place, and time. Epicritic  sensation to light touch grossly present bilaterally.  Dermatologic Nail Exam: Pt has thick disfigured discolored nails with subungual debris noted bilateral entire nail hallux through fifth toenails.  Pain on palpation to the nails. No open wounds. No skin lesions.  Orthopedic: Bilateral submetatarsal 5 porokeratotic/benign skin lesion with central nucleated core noted.  Upon debridement no pinpoint bleeding noted.  No wounds noted.   Radiographs:   3 views of skeletally mature adult bilateral foot: Midfoot arthritis noted.  Plantar and posterior heel spurring noted.  Plantarflexed fifth metatarsal noted with mild signs of tailor's bunion.  There is increase in lateral deviation angle. Assessment:   No diagnosis found.      Plan:  Patient was evaluated and treated and all questions answered.  Bilateral submetatarsal 5 porokeratosis x2 -X-ray to the patient the etiology of porokeratosis and was treatment options were extensively discussed.  Given the amount of pain she is having I believe she will benefit from debridement of the lesion followed by excision of the lesion.  I discussed this with the patient she states understanding like to proceed with that. -Using chisel blade and handle the lesion was debrided down to healthy striated tissue followed by excision of central nucleated core.  Immediate pain was noted after debridement and excision.  No pinpoint bleeding noted.  Onychomycosis with pain  -Nails palliatively debrided as below. -Educated on self-care  Procedure: Nail Debridement Rationale: pain  Type of Debridement: manual, sharp debridement. Instrumentation: Nail nipper, rotary burr. Number of Nails: 10  Procedures and Treatment: Consent by patient was obtained for treatment procedures. The patient understood the discussion of treatment and procedures well. All questions were answered thoroughly reviewed. Debridement of mycotic and hypertrophic toenails, 1 through 5  bilateral and clearing of subungual debris. No ulceration, no infection noted.  Return Visit-Office Procedure: Patient instructed to return to the office for a follow up visit 3 months for continued evaluation and treatment.  Nicholes Rough, DPM    No follow-ups on file.     No follow-ups on file.

## 2023-07-08 ENCOUNTER — Telehealth: Payer: Self-pay | Admitting: Family Medicine

## 2023-07-08 NOTE — Telephone Encounter (Signed)
Patient returned call regarding labs,I relayed message,she okayed and verbalized understanding.

## 2023-07-14 ENCOUNTER — Telehealth: Payer: Self-pay | Admitting: Family Medicine

## 2023-07-14 NOTE — Telephone Encounter (Signed)
Patients daughter called stating that her mom neurologist Dr Meryl Dare would like for her to go on cholesterol injections for her low LDL . She would like to know what does Dr Ermalene Searing thinks about this?

## 2023-07-17 NOTE — Telephone Encounter (Signed)
Please contact patient.  Let her know that I agree with her neurologist and the need for lowering cholesterol to prevent cardiovascular disease.  If she does not tolerate oral statin and injectable medication would be a good option for her.  We can have her speak with the pharmacist if she would like more information.

## 2023-07-18 NOTE — Telephone Encounter (Signed)
Renee [name] notified as instructed by telephone.  States understanding.  She did ask if Renee [name] would still continue the Zetia along with the injection.  Please advise.

## 2023-07-19 NOTE — Telephone Encounter (Signed)
Tammy (daughter) notified as instructed by telephone.  States understanding.

## 2023-07-19 NOTE — Telephone Encounter (Signed)
She will probably continue the Zetia initially but stop it over time if tolerating the new medication.  I cannot guarantee what plan the neurologist has specifically though.  She can ask them.

## 2023-07-25 ENCOUNTER — Other Ambulatory Visit: Payer: Self-pay | Admitting: Nurse Practitioner

## 2023-08-05 ENCOUNTER — Ambulatory Visit (INDEPENDENT_AMBULATORY_CARE_PROVIDER_SITE_OTHER): Payer: Medicare Other | Admitting: *Deleted

## 2023-08-05 VITALS — BP 147/80 | HR 82 | Temp 97.3°F | Resp 18 | Ht 64.0 in | Wt 142.4 lb

## 2023-08-05 DIAGNOSIS — E785 Hyperlipidemia, unspecified: Secondary | ICD-10-CM | POA: Diagnosis not present

## 2023-08-05 DIAGNOSIS — E1169 Type 2 diabetes mellitus with other specified complication: Secondary | ICD-10-CM

## 2023-08-05 MED ORDER — INCLISIRAN SODIUM 284 MG/1.5ML ~~LOC~~ SOSY
284.0000 mg | PREFILLED_SYRINGE | Freq: Once | SUBCUTANEOUS | Status: AC
  Administered 2023-08-05: 284 mg via SUBCUTANEOUS
  Filled 2023-08-05: qty 1.5

## 2023-08-05 NOTE — Patient Instructions (Signed)
 Inclisiran Injection What is this medication? INCLISIRAN (in kli SIR an) treats high cholesterol. It works by decreasing bad cholesterol (such as LDL) in your blood. Changes to diet and exercise are often combined with this medication. This medicine may be used for other purposes; ask your health care provider or pharmacist if you have questions. COMMON BRAND NAME(S): LEQVIO What should I tell my care team before I take this medication? They need to know if you have any of these conditions: An unusual or allergic reaction to inclisiran, other medications, foods, dyes, or preservatives Pregnant or trying to get pregnant Breast-feeding How should I use this medication? This medication is injected under the skin. It is given by your care team in a hospital or clinic setting. Talk to your care team about the use of this medication in children. Special care may be needed. Overdosage: If you think you have taken too much of this medicine contact a poison control center or emergency room at once. NOTE: This medicine is only for you. Do not share this medicine with others. What if I miss a dose? Keep appointments for follow-up doses. It is important not to miss your dose. Call your care team if you are unable to keep an appointment. What may interact with this medication? Interactions are not expected. This list may not describe all possible interactions. Give your health care provider a list of all the medicines, herbs, non-prescription drugs, or dietary supplements you use. Also tell them if you smoke, drink alcohol, or use illegal drugs. Some items may interact with your medicine. What should I watch for while using this medication? Visit your care team for regular checks on your progress. Tell your care team if your symptoms do not start to get better or if they get worse. You may need blood work while you are taking this medication. What side effects may I notice from receiving this  medication? Side effects that you should report to your care team as soon as possible: Allergic reactions--skin rash, itching, hives, swelling of the face, lips, tongue, or throat Side effects that usually do not require medical attention (report these to your care team if they continue or are bothersome): Joint pain Pain, redness, or irritation at injection site This list may not describe all possible side effects. Call your doctor for medical advice about side effects. You may report side effects to FDA at 1-800-FDA-1088. Where should I keep my medication? This medication is given in a hospital or clinic. It will not be stored at home. NOTE: This sheet is a summary. It may not cover all possible information. If you have questions about this medicine, talk to your doctor, pharmacist, or health care provider.  2024 Elsevier/Gold Standard (2022-07-09 00:00:00)

## 2023-08-05 NOTE — Progress Notes (Signed)
Diagnosis: Hyperlipidemia  Provider:  Mannam, Praveen MD  Procedure: Injection  Leqvio (inclisiran), Dose: 284 mg, Site: subcutaneous, Number of injections: 1  Post Care: Observation period completed  Discharge: Condition: Good, Destination: Home . AVS Provided  Performed by:  Williams, Kathryn A, RN       

## 2023-09-24 ENCOUNTER — Other Ambulatory Visit: Payer: Self-pay | Admitting: Family Medicine

## 2023-09-26 ENCOUNTER — Other Ambulatory Visit: Payer: Self-pay | Admitting: Family Medicine

## 2023-09-26 DIAGNOSIS — Z23 Encounter for immunization: Secondary | ICD-10-CM | POA: Diagnosis not present

## 2023-10-16 DIAGNOSIS — Z23 Encounter for immunization: Secondary | ICD-10-CM | POA: Diagnosis not present

## 2023-10-18 ENCOUNTER — Ambulatory Visit: Payer: Medicare Other | Admitting: Obstetrics & Gynecology

## 2023-10-18 ENCOUNTER — Encounter: Payer: Self-pay | Admitting: Obstetrics & Gynecology

## 2023-10-18 VITALS — BP 152/66 | HR 96 | Ht 63.0 in | Wt 139.0 lb

## 2023-10-18 DIAGNOSIS — L900 Lichen sclerosus et atrophicus: Secondary | ICD-10-CM | POA: Diagnosis not present

## 2023-10-18 DIAGNOSIS — N905 Atrophy of vulva: Secondary | ICD-10-CM | POA: Diagnosis not present

## 2023-10-18 NOTE — Progress Notes (Signed)
    GYNECOLOGY PROGRESS NOTE  Subjective:    Patient ID: Renee [name], female    DOB: Feb 03, 1946, 77 y.o.   MRN: [id number]  HPI  Patient is a 77 y.o. [id number] here for a 6 month vulva check. She has lichen sclerosis and uses temovate several nights per week. I prescribed vaginal estrogen to help with her VVA, but it was cost prohibitive for her.  She does have some OIB incontinence episodes and wears pads if she is going out.  The following portions of the patient's history were reviewed and updated as appropriate: allergies, current medications, past family history, past medical history, past social history, past surgical history, and problem list.  Review of Systems Pertinent items are noted in HPI.  She reports a recent removal of skin cancer from her left wrist, healed well.  Objective:   Blood pressure (!) 152/66, pulse 96, height 5\' 3"  (1.6 m), weight 139 lb (63 kg). Body mass index is 24.62 kg/m. Well nourished, well hydrated White female, no apparent distress She is ambulating and conversing normally. EG- VVA but no evidence of vulvar cancer Biimanual exam- normal size and shape, anteverted uterus, normal adnexal exam  Assessment:   1. Lichen sclerosus et atrophicus   2. Vulvar atrophy      Plan:   1. Lichen sclerosus et atrophicus   2. Vulvar atrophy   She will come back for an annual exam/prn sooner

## 2023-10-19 DIAGNOSIS — M25512 Pain in left shoulder: Secondary | ICD-10-CM | POA: Diagnosis not present

## 2023-10-19 DIAGNOSIS — Z96611 Presence of right artificial shoulder joint: Secondary | ICD-10-CM | POA: Diagnosis not present

## 2023-10-25 ENCOUNTER — Ambulatory Visit (INDEPENDENT_AMBULATORY_CARE_PROVIDER_SITE_OTHER): Payer: Medicare Other | Admitting: Podiatry

## 2023-10-25 ENCOUNTER — Encounter: Payer: Self-pay | Admitting: Podiatry

## 2023-10-25 VITALS — BP 185/82 | HR 90

## 2023-10-25 DIAGNOSIS — Q828 Other specified congenital malformations of skin: Secondary | ICD-10-CM

## 2023-10-25 NOTE — Progress Notes (Signed)
Subjective:  Patient ID: Renee [name], female    DOB: [birth year],  MRN: [id number]  Chief Complaint  Patient presents with   Diabetes    "Trim my toenails and cut those things off on the bottom of my feet."    77 y.o. female presents with the above complaint.  Patient presents with complaint of bilateral fifth metatarsal hyperkeratotic lesion.  Patient stated they are still very painful but the debridement does help.  She is in agreement thickened elongated dystrophic toenails x10 mild pain on palpation she would like for them to debride down to.   Review of Systems: Negative except as noted in the HPI. Denies N/V/F/Ch.  Past Medical History:  Diagnosis Date   Allergy    Anxiety    Arthritis    CAD (coronary artery disease)    non-obstructive. cath 11/11   Claustrophobia    Diabetes mellitus    borderline diabetic - diet controlled    GERD (gastroesophageal reflux disease)    History of chicken pox    History of phobia    clastrophobia   Hyperlipidemia    Hypertension    Hypothyroidism    Kidney stones    hx of    Lichen sclerosus    Psoriasis    Stroke (HCC)    Urinary tract infection    hx of     Current Outpatient Medications:    acetaminophen (TYLENOL) 650 MG CR tablet, Take 1,300 mg by mouth every 8 (eight) hours as needed for pain., Disp: , Rfl:    aspirin EC 81 MG tablet, Take 81 mg by mouth daily., Disp: , Rfl:    Carboxymethylcellulose Sodium (THERATEARS OP), Place 1 drop into both eyes at bedtime as needed (dry eyes)., Disp: , Rfl:    clobetasol ointment (TEMOVATE) 0.05 %, APPLY TOPICALLY DAILY AS NEEDED FOR PSORIASIS FLARES, Disp: 60 g, Rfl: 0   Coenzyme Q10 (COQ10) 200 MG CAPS, Take 200 mg by mouth daily., Disp: , Rfl:    Dulaglutide (TRULICITY) 0.75 MG/0.5ML SOPN, Inject 0.75 mg into the skin once a week. Via [employer] PAP, Disp: 8 mL, Rfl: 1   ezetimibe (ZETIA) 10 MG tablet, TAKE 1 TABLET BY MOUTH DAILY, Disp: 90 tablet, Rfl: 3   famotidine (PEPCID) 20  MG tablet, Take 20 mg by mouth daily as needed for heartburn or indigestion., Disp: , Rfl:    Flaxseed, Linseed, (FLAX SEED OIL PO), Take 1 capsule by mouth daily., Disp: , Rfl:    fluticasone (FLONASE) 50 MCG/ACT nasal spray, Place 1 spray into both nostrils daily as needed for allergies., Disp: , Rfl:    levothyroxine (SYNTHROID) 75 MCG tablet, TAKE 1 TABLET BY MOUTH EVERY DAY, Disp: 90 tablet, Rfl: 0   losartan (COZAAR) 50 MG tablet, TAKE 1 TABLET BY MOUTH EVERY DAY, Disp: 90 tablet, Rfl: 1   MAGNESIUM PO, Take 1 tablet by mouth daily., Disp: , Rfl:    meloxicam (MOBIC) 7.5 MG tablet, Take 1 tablet by mouth daily as needed., Disp: , Rfl:    Multiple Vitamin (MULTIVITAMIN) tablet, Take 1 tablet by mouth daily., Disp: , Rfl:    Skin Protectants, Misc. (EUCERIN) cream, Apply 1 application  topically as needed (eczema)., Disp: , Rfl:    triamcinolone cream (KENALOG) 0.1 %, Apply 1 application topically 2 (two) times daily as needed (psoriasis)., Disp: 454 g, Rfl: 0   Vitamin D, Cholecalciferol, 25 MCG (1000 UT) CAPS, Take 2,000 Units by mouth daily., Disp: , Rfl:  Social History   Tobacco Use  Smoking Status Never  Smokeless Tobacco Never    Allergies  Allergen Reactions   Celexa [Citalopram] Shortness Of Breath and Swelling    Throat swelling and difficulty breathing   Other Rash    Metal    Codeine Nausea And Vomiting   Metformin Nausea And Vomiting   Robaxin [Methocarbamol] Nausea Only   Tizanidine Other (See Comments)    Oversedated   Crestor [Rosuvastatin] Rash   Nickel Rash   Simvastatin Swelling and Rash   Objective:   Vitals:   10/25/23 1550  BP: (!) 185/82  Pulse: 90   There is no height or weight on file to calculate BMI. Constitutional Well developed. Well nourished.  Vascular Dorsalis pedis pulses palpable bilaterally. Posterior tibial pulses palpable bilaterally. Capillary refill normal to all digits.  No cyanosis or clubbing noted. Pedal hair growth  normal.  Neurologic Normal speech. Oriented to person, place, and time. Epicritic sensation to light touch grossly present bilaterally.  Dermatologic Nail Exam: Pt has thick disfigured discolored nails with subungual debris noted bilateral entire nail hallux through fifth toenails.  Pain on palpation to the nails. No open wounds. No skin lesions.  Orthopedic: Bilateral submetatarsal 5 porokeratotic/benign skin lesion with central nucleated core noted.  Upon debridement no pinpoint bleeding noted.  No wounds noted.   Radiographs:   3 views of skeletally mature adult bilateral foot: Midfoot arthritis noted.  Plantar and posterior heel spurring noted.  Plantarflexed fifth metatarsal noted with mild signs of tailor's bunion.  There is increase in lateral deviation angle. Assessment:   No diagnosis found.      Plan:  Patient was evaluated and treated and all questions answered.  Bilateral submetatarsal 5 porokeratosis x2 -X-ray to the patient the etiology of porokeratosis and was treatment options were extensively discussed.  Given the amount of pain she is having I believe she will benefit from debridement of the lesion followed by excision of the lesion.  I discussed this with the patient she states understanding like to proceed with that. -Using chisel blade and handle the lesion was debrided down to healthy striated tissue followed by excision of central nucleated core.  Immediate pain was noted after debridement and excision.  No pinpoint bleeding noted.  Onychomycosis with pain  -Nails palliatively debrided as below. -Educated on self-care  Procedure: Nail Debridement Rationale: pain  Type of Debridement: manual, sharp debridement. Instrumentation: Nail nipper, rotary burr. Number of Nails: 10  Procedures and Treatment: Consent by patient was obtained for treatment procedures. The patient understood the discussion of treatment and procedures well. All questions were answered  thoroughly reviewed. Debridement of mycotic and hypertrophic toenails, 1 through 5 bilateral and clearing of subungual debris. No ulceration, no infection noted.  Return Visit-Office Procedure: Patient instructed to return to the office for a follow up visit 3 months for continued evaluation and treatment.  Nicholes Rough, DPM    No follow-ups on file.     No follow-ups on file.

## 2023-11-07 ENCOUNTER — Ambulatory Visit: Payer: Medicare Other

## 2023-11-07 VITALS — BP 177/71 | HR 78 | Temp 97.7°F | Ht 64.0 in | Wt 143.4 lb

## 2023-11-07 DIAGNOSIS — E785 Hyperlipidemia, unspecified: Secondary | ICD-10-CM | POA: Diagnosis not present

## 2023-11-07 DIAGNOSIS — E1169 Type 2 diabetes mellitus with other specified complication: Secondary | ICD-10-CM

## 2023-11-07 MED ORDER — INCLISIRAN SODIUM 284 MG/1.5ML ~~LOC~~ SOSY
284.0000 mg | PREFILLED_SYRINGE | Freq: Once | SUBCUTANEOUS | Status: AC
  Administered 2023-11-07: 284 mg via SUBCUTANEOUS
  Filled 2023-11-07: qty 1.5

## 2023-11-07 NOTE — Progress Notes (Signed)
Diagnosis: Hyperlipidemia  Provider:  Chilton Greathouse MD  Procedure: Injection  Leqvio (inclisiran), Dose: 284 mg, Site: subcutaneous, Number of injections: 1  Post Care: Patient declined observation  Discharge: Condition: Good, Destination: Home . AVS Declined  Performed by:  Marlow Baars [place], RN

## 2023-11-22 ENCOUNTER — Telehealth: Payer: Self-pay | Admitting: *Deleted

## 2023-11-22 DIAGNOSIS — E1169 Type 2 diabetes mellitus with other specified complication: Secondary | ICD-10-CM

## 2023-11-22 DIAGNOSIS — E1159 Type 2 diabetes mellitus with other circulatory complications: Secondary | ICD-10-CM

## 2023-11-22 NOTE — Telephone Encounter (Signed)
-----   Message from Alvina Chou sent at 11/22/2023  2:39 PM EST ----- Regarding: Lab orders for Tue, 12.10.24 Patient is scheduled for CPX labs, please order future labs, Thanks , Camelia Eng

## 2023-12-05 NOTE — Progress Notes (Unsigned)
Cardiology Office Note  Date:  12/05/2023   ID:  Shenese, Monty January 31, 1946, MRN [id number]  PCP:  Excell Seltzer, MD   No chief complaint on file.   HPI:  Ms. Hancock is a very pleasant 77 year old woman with a history of  hypertension,  hyperlipidemia,  long history of diabetes.  Cardiac catheterization October 2011 for chest pain showed mild to moderate non-obstructive CAD. 2010  with  EF of 55% to 60%.   40-50% LAD disease, 60% ostial diagonal disease, 40% PDA History of 2 knee replacements  in [place] Medication intolerances She presents today for follow-up of her coronary artery disease  Last seen by myself in clinic December 2023   Seen for preoperative evaluation August 2023 by one of our providers Underwent Right shoulder rotator cuff tear arthropathy October 2023 Doing PT, doing well  BP at home 130s/60-70 In general reports feeling relatively well, denies chest pain concerning for angina No regular exercise program  Denies any TIA or stroke symptoms No significant lower extremity edema  Lab work reviewed A1c 6.7 Total cholesterol 185 LDL 106 On Zetia alone  EKG personally reviewed by myself on todays visit Normal sinus rhythm rate 83 bpm no significant ST-T wave changes  Other past medical history reviewed In the hospital 05/2021: Possible stroke MRI: no CVA Neck CT: mild carotid disease Etiology unclear  Previously took her self off gemfibrozil and Zetia, was having muscle aches Cholesterol jumped up greater than 200  put her self back on Zetia, no side effects  Previously reported muscle hurting in arms, had cortisone , completed PT Followed by ortho and rheumatology  Elevated blood pressure today Blood pressure elevated on prior clinic visit Reports it is better controlled at home but did not bring any numbers with her Reports sometimes in the 1 teens to 130s May have whitecoat  Statin intolerance Previous significant reaction to statins,  simvastatin may have caused a rash.     PMH:   has a past medical history of Allergy, Anxiety, Arthritis, CAD (coronary artery disease), Claustrophobia, Diabetes mellitus, GERD (gastroesophageal reflux disease), History of chicken pox, History of phobia, Hyperlipidemia, Hypertension, Hypothyroidism, Kidney stones, Lichen sclerosus, Psoriasis, Stroke (HCC), and Urinary tract infection.  PSH:    Past Surgical History:  Procedure Laterality Date   APPENDECTOMY     CARDIAC CATHETERIZATION     2011   CARPAL TUNNEL RELEASE     Left trigger finger   CATARACT EXTRACTION W/PHACO Left 06/06/2018   Procedure: CATARACT EXTRACTION PHACO AND INTRAOCULAR LENS PLACEMENT (IOC);  Surgeon: Galen Manila, MD;  Location: [facility] ORS;  Service: Ophthalmology;  Laterality: Left;  [place]  00:23 AP% 16.0 CDE 3.69 Fluid pack lot # [id number]   CATARACT EXTRACTION W/PHACO Right 09/19/2018   Procedure: CATARACT EXTRACTION PHACO AND INTRAOCULAR LENS PLACEMENT (IOC);  Surgeon: Galen Manila, MD;  Location: [facility] ORS;  Service: Ophthalmology;  Laterality: Right;  [place] 00:29.5 AP% 17.0 CDE$ 5.03 Fluid pack lot # [id number]   COLONOSCOPY WITH PROPOFOL N/A 05/13/2017   Procedure: COLONOSCOPY WITH PROPOFOL;  Surgeon: Scot Jun, MD;  Location: [facility] ENDOSCOPY;  Service: Endoscopy;  Laterality: N/A;   ESOPHAGOGASTRODUODENOSCOPY (EGD) WITH PROPOFOL N/A 01/04/2020   Procedure: ESOPHAGOGASTRODUODENOSCOPY (EGD) WITH PROPOFOL;  Surgeon: Wyline Mood, MD;  Location: [facility] ENDOSCOPY;  Service: Gastroenterology;  Laterality: N/A;   EYE SURGERY  2004   lasik - right eye    JOINT REPLACEMENT     KNEE SURGERY Right 2015   arthroscopy  REVERSE SHOULDER ARTHROPLASTY Right 10/21/2022   Procedure: REVERSE SHOULDER ARTHROPLASTY;  Surgeon: Francena Hanly, MD;  Location: [facility] ORS;  Service: Orthopedics;  Laterality: Right;    TOTAL KNEE ARTHROPLASTY Bilateral 06/12/2015   Procedure: TOTAL KNEE BILATERAL;  Surgeon: Ollen Gross, MD;   Location: [facility] ORS;  Service: Orthopedics;  Laterality: Bilateral;  and epidural   TUBAL LIGATION     Tubes tied     and untied. and tied again    Current Outpatient Medications  Medication Sig Dispense Refill   acetaminophen (TYLENOL) 650 MG CR tablet Take 1,300 mg by mouth every 8 (eight) hours as needed for pain.     aspirin EC 81 MG tablet Take 81 mg by mouth daily.     Carboxymethylcellulose Sodium (THERATEARS OP) Place 1 drop into both eyes at bedtime as needed (dry eyes).     clobetasol ointment (TEMOVATE) 0.05 % APPLY TOPICALLY DAILY AS NEEDED FOR PSORIASIS FLARES 60 g 0   Coenzyme Q10 (COQ10) 200 MG CAPS Take 200 mg by mouth daily.     Dulaglutide (TRULICITY) 0.75 MG/0.5ML SOPN Inject 0.75 mg into the skin once a week. Via [employer] Cares PAP 8 mL 1   ezetimibe (ZETIA) 10 MG tablet TAKE 1 TABLET BY MOUTH DAILY 90 tablet 3   famotidine (PEPCID) 20 MG tablet Take 20 mg by mouth daily as needed for heartburn or indigestion.     Flaxseed, Linseed, (FLAX SEED OIL PO) Take 1 capsule by mouth daily.     fluticasone (FLONASE) 50 MCG/ACT nasal spray Place 1 spray into both nostrils daily as needed for allergies.     levothyroxine (SYNTHROID) 75 MCG tablet TAKE 1 TABLET BY MOUTH EVERY DAY 90 tablet 0   losartan (COZAAR) 50 MG tablet TAKE 1 TABLET BY MOUTH EVERY DAY 90 tablet 1   MAGNESIUM PO Take 1 tablet by mouth daily.     meloxicam (MOBIC) 7.5 MG tablet Take 1 tablet by mouth daily as needed.     Multiple Vitamin (MULTIVITAMIN) tablet Take 1 tablet by mouth daily.     Skin Protectants, Misc. (EUCERIN) cream Apply 1 application  topically as needed (eczema).     triamcinolone cream (KENALOG) 0.1 % Apply 1 application topically 2 (two) times daily as needed (psoriasis). 454 g 0   Vitamin D, Cholecalciferol, 25 MCG (1000 UT) CAPS Take 2,000 Units by mouth daily.     No current facility-administered medications for this visit.    Allergies:   Celexa [citalopram], Other, Codeine, Metformin,  Robaxin [methocarbamol], Tizanidine, Crestor [rosuvastatin], Nickel, and Simvastatin   Social History:  The patient  reports that she has never smoked. She has never used smokeless tobacco. She reports current alcohol use. She reports that she does not use drugs.   Family History:   family history includes Arthritis in her brother and brother; Breast cancer (age of onset: 46) in her daughter; COPD in her mother; Diabetes in her brother, brother, mother, and sister; Heart disease in her brother and brother; Hypertension in her father, mother, and sister; Stomach cancer in her maternal grandfather; Stroke in her maternal grandfather.   Review of Systems: Review of Systems  Constitutional: Negative.   Respiratory: Negative.    Cardiovascular: Negative.   Gastrointestinal: Negative.   Musculoskeletal: Negative.   Neurological: Negative.   Psychiatric/Behavioral: Negative.    All other systems reviewed and are negative.   PHYSICAL EXAM: VS:  There were no vitals taken for this visit. , BMI There is no  height or weight on file to calculate BMI. Constitutional:  oriented to person, place, and time. No distress.  HENT:  Head: Grossly normal Eyes:  no discharge. No scleral icterus.  Neck: No JVD, no carotid bruits  Cardiovascular: Regular rate and rhythm, no murmurs appreciated Pulmonary/Chest: Clear to auscultation bilaterally, no wheezes or rails Abdominal: Soft.  no distension.  no tenderness.  Musculoskeletal: Normal range of motion Neurological:  normal muscle tone. Coordination normal. No atrophy Skin: Skin warm and dry Psychiatric: normal affect, pleasant  Recent Labs: 06/01/2023: ALT 14; BUN 11; Creatinine, Ser 0.67; Potassium 4.3; Sodium 140 07/05/2023: TSH 1.65   Lipid Panel Lab Results  Component Value Date   CHOL 179 06/01/2023   HDL 44.50 06/01/2023   LDLCALC 106 (H) 11/29/2022   TRIG 210.0 (H) 06/01/2023      Wt Readings from Last 3 Encounters:  11/07/23 143 lb 6.4 oz  (65 kg)  10/18/23 139 lb (63 kg)  08/05/23 142 lb 6.4 oz (64.6 kg)     ASSESSMENT AND PLAN:  Essential hypertension -  Blood pressure is well controlled on today's visit. No changes made to the medications.  Atherosclerosis of native coronary artery of native heart without angina pectoris - Plan: EKG 12-Lead Denies anginal symptoms, cholesterol above goal We discussed PCSK9 inhibitor at length, not interested in sending in a prescription at this time, would like to think about it and talk with Dr. Ermalene Searing  Hypercholesteremia Statin intolerance, Reports compliance with her Zetia LDL not at goal, numbers discussed with her discussed PCSK9 inhibitor, we have shown her the demo pen, she would like to think about it  Type 2 diabetes mellitus with complication, unspecified long term insulin use status (HCC) A1c relatively well-controlled 6.6   Total encounter time more than 30 minutes  Greater than 50% was spent in counseling and coordination of care with the patient   No orders of the defined types were placed in this encounter.     Signed, Dossie Arbour, M.D., Ph.D. 12/05/2023  [facility] [place], [place] [phone]

## 2023-12-06 ENCOUNTER — Ambulatory Visit: Payer: Medicare Other | Admitting: Cardiovascular Disease

## 2023-12-06 ENCOUNTER — Other Ambulatory Visit (INDEPENDENT_AMBULATORY_CARE_PROVIDER_SITE_OTHER): Payer: Medicare Other

## 2023-12-06 DIAGNOSIS — I25118 Atherosclerotic heart disease of native coronary artery with other forms of angina pectoris: Secondary | ICD-10-CM

## 2023-12-06 DIAGNOSIS — E785 Hyperlipidemia, unspecified: Secondary | ICD-10-CM

## 2023-12-06 DIAGNOSIS — E1159 Type 2 diabetes mellitus with other circulatory complications: Secondary | ICD-10-CM

## 2023-12-06 DIAGNOSIS — I10 Essential (primary) hypertension: Secondary | ICD-10-CM

## 2023-12-06 DIAGNOSIS — E1169 Type 2 diabetes mellitus with other specified complication: Secondary | ICD-10-CM

## 2023-12-06 DIAGNOSIS — E119 Type 2 diabetes mellitus without complications: Secondary | ICD-10-CM

## 2023-12-06 DIAGNOSIS — E7800 Pure hypercholesterolemia, unspecified: Secondary | ICD-10-CM

## 2023-12-06 DIAGNOSIS — M791 Myalgia, unspecified site: Secondary | ICD-10-CM

## 2023-12-06 LAB — COMPREHENSIVE METABOLIC PANEL
ALT: 15 U/L (ref 0–35)
AST: 21 U/L (ref 0–37)
Albumin: 4.4 g/dL (ref 3.5–5.2)
Alkaline Phosphatase: 67 U/L (ref 39–117)
BUN: 25 mg/dL — ABNORMAL HIGH (ref 6–23)
CO2: 31 meq/L (ref 19–32)
Calcium: 9.4 mg/dL (ref 8.4–10.5)
Chloride: 104 meq/L (ref 96–112)
Creatinine, Ser: 0.67 mg/dL (ref 0.40–1.20)
GFR: 83.22 mL/min (ref >60.00)
Glucose, Bld: 121 mg/dL — ABNORMAL HIGH (ref 70–99)
Potassium: 4.3 meq/L (ref 3.5–5.1)
Sodium: 142 meq/L (ref 135–145)
Total Bilirubin: 0.4 mg/dL (ref 0.2–1.2)
Total Protein: 7.1 g/dL (ref 6.0–8.3)

## 2023-12-06 LAB — LIPID PANEL
Cholesterol: 114 mg/dL (ref 0–200)
HDL: 49.3 mg/dL (ref >39.00)
LDL Cholesterol: 41 mg/dL (ref 0–99)
NonHDL: 65.07
Total CHOL/HDL Ratio: 2
Triglycerides: 119 mg/dL (ref 0.0–149.0)
VLDL: 23.8 mg/dL (ref 0.0–40.0)

## 2023-12-06 LAB — MICROALBUMIN / CREATININE URINE RATIO
Creatinine,U: 151.7 mg/dL
Microalb Creat Ratio: 2 mg/g (ref 0.0–30.0)
Microalb, Ur: 3 mg/dL — ABNORMAL HIGH (ref 0.0–1.9)

## 2023-12-06 LAB — HEMOGLOBIN A1C: Hgb A1c MFr Bld: 6.8 % — ABNORMAL HIGH (ref 4.6–6.5)

## 2023-12-07 NOTE — Progress Notes (Signed)
No critical labs need to be addressed urgently. We will discuss labs in detail at upcoming office visit.   

## 2023-12-11 ENCOUNTER — Other Ambulatory Visit: Payer: Self-pay | Admitting: Family Medicine

## 2023-12-11 DIAGNOSIS — E1159 Type 2 diabetes mellitus with other circulatory complications: Secondary | ICD-10-CM

## 2023-12-13 ENCOUNTER — Ambulatory Visit: Payer: Medicare Other | Admitting: Family Medicine

## 2023-12-13 VITALS — BP 168/72 | HR 77 | Temp 98.2°F | Ht 62.5 in | Wt 140.0 lb

## 2023-12-13 DIAGNOSIS — H00014 Hordeolum externum left upper eyelid: Secondary | ICD-10-CM | POA: Diagnosis not present

## 2023-12-13 DIAGNOSIS — E1159 Type 2 diabetes mellitus with other circulatory complications: Secondary | ICD-10-CM

## 2023-12-13 DIAGNOSIS — I152 Hypertension secondary to endocrine disorders: Secondary | ICD-10-CM | POA: Diagnosis not present

## 2023-12-13 DIAGNOSIS — E1169 Type 2 diabetes mellitus with other specified complication: Secondary | ICD-10-CM | POA: Diagnosis not present

## 2023-12-13 DIAGNOSIS — Z7985 Long-term (current) use of injectable non-insulin antidiabetic drugs: Secondary | ICD-10-CM | POA: Diagnosis not present

## 2023-12-13 DIAGNOSIS — E039 Hypothyroidism, unspecified: Secondary | ICD-10-CM | POA: Diagnosis not present

## 2023-12-13 DIAGNOSIS — E785 Hyperlipidemia, unspecified: Secondary | ICD-10-CM

## 2023-12-13 DIAGNOSIS — Z1211 Encounter for screening for malignant neoplasm of colon: Secondary | ICD-10-CM

## 2023-12-13 MED ORDER — ERYTHROMYCIN 5 MG/GM OP OINT: TOPICAL_OINTMENT | OPHTHALMIC | 0 refills | Status: DC

## 2023-12-13 NOTE — Assessment & Plan Note (Signed)
Chronic, associated with hypertension Well-controlled on Trulicity 1.94 mg weekly.  No side effects

## 2023-12-13 NOTE — Assessment & Plan Note (Signed)
Inadequate control, chronic.  Continue current medication.  Losartan 50 mg p.o. daily

## 2023-12-13 NOTE — Assessment & Plan Note (Addendum)
Chronic, improved control LDL goal < 70 given CAD.   She continues Zetia 10 mg p.o. daily.  She is intolerant of statins given myopathy.

## 2023-12-13 NOTE — Progress Notes (Signed)
Patient ID: Renee [name], female    DOB: 15-Feb-1946, 77 y.o.   MRN: [id number]  This visit was conducted in person.  BP (!) 140/80 (BP Location: Left Arm, Patient Position: Sitting, Cuff Size: Normal)   Pulse 77   Temp 98.2 F (36.8 C) (Temporal)   Ht 5' 2.5" (1.588 m)   Wt 140 lb (63.5 kg)   SpO2 96%   BMI 25.20 kg/m    CC:  Chief Complaint  Patient presents with   Annual Exam    MWV scheduled for 12/25/2023    Subjective:   HPI: Renee [name] is a 77 y.o. female presenting on 12/13/2023 for Annual Exam (MWV scheduled for 12/25/2023)  The patient presents for   review of chronic health problems. He/She also has the following acute concerns today:  Has new onset in last day.. stye on left upper eye lid. No vision changes.   Hypertension:   Inadequate control in office today and at last several checks despite losartan 50 mg daily says she was running around lately.  Higher numbers 11/11 was  with machine not naual check.  Has had some increased stress in neighborhood with their illegal dops.  Not able to walk, go outside. BP Readings from Last 3 Encounters:  12/13/23 (!) 140/80  11/07/23 (!) 177/71  10/25/23 (!) 185/82  Using medication without problems or lightheadedness:  Chest pain with exertion:none Edema:none Short of breath:none Average home BPs: 139-140/70-80  Per last note Other issues:  Elevated Cholesterol: Chronic, significant improvement in control o lequvio. She continues Zetia 10 mg p.o. daily.  She is intolerant of statins given myopathy. Lab Results  Component Value Date   CHOL 114 12/06/2023   HDL 49.30 12/06/2023   LDLCALC 41 12/06/2023   LDLDIRECT 121.0 06/01/2023   TRIG 119.0 12/06/2023   CHOLHDL 2 12/06/2023  Using medications without problems: Muscle aches:  Diet compliance: Exercise: PT for  right shoulder post surgery... reviewed recent ORtho OV. Other complaints:  Diabetes:  well controlled on Trulicity 0.75 mg weekly  Lab  Results  Component Value Date   HGBA1C 6.8 (H) 12/06/2023  Using medications without difficulties: Hypoglycemic episodes: Hyperglycemic episodes: Feet problems: no ulcer Blood Sugars averaging: 101-113 eye exam within last year: yes  Wt Readings from Last 3 Encounters:  12/13/23 140 lb (63.5 kg)  11/07/23 143 lb 6.4 oz (65 kg)  10/18/23 139 lb (63 kg)    Hypothyroid stable control last check on levothyroxine 75 mcg daily Lab Results  Component Value Date   TSH 1.65 07/05/2023   MDD,GAD: tolerable control on no medication.     Relevant past medical, surgical, family and social history reviewed and updated as indicated. Interim medical history since our last visit reviewed. Allergies and medications reviewed and updated. Outpatient Medications Prior to Visit  Medication Sig Dispense Refill   acetaminophen (TYLENOL) 650 MG CR tablet Take 1,300 mg by mouth every 8 (eight) hours as needed for pain.     aspirin EC 81 MG tablet Take 81 mg by mouth daily.     Carboxymethylcellulose Sodium (THERATEARS OP) Place 1 drop into both eyes at bedtime as needed (dry eyes).     clobetasol ointment (TEMOVATE) 0.05 % APPLY TOPICALLY DAILY AS NEEDED FOR PSORIASIS FLARES 60 g 0   Coenzyme Q10 (COQ10) 200 MG CAPS Take 200 mg by mouth daily.     ezetimibe (ZETIA) 10 MG tablet TAKE 1 TABLET BY MOUTH DAILY 90 tablet 3  famotidine (PEPCID) 20 MG tablet Take 20 mg by mouth daily as needed for heartburn or indigestion.     Flaxseed, Linseed, (FLAX SEED OIL PO) Take 1 capsule by mouth daily.     fluticasone (FLONASE) 50 MCG/ACT nasal spray Place 1 spray into both nostrils daily as needed for allergies.     levothyroxine (SYNTHROID) 75 MCG tablet TAKE 1 TABLET BY MOUTH EVERY DAY 90 tablet 0   losartan (COZAAR) 50 MG tablet TAKE 1 TABLET BY MOUTH EVERY DAY 90 tablet 1   MAGNESIUM PO Take 1 tablet by mouth daily.     Multiple Vitamin (MULTIVITAMIN) tablet Take 1 tablet by mouth daily.     Skin Protectants,  Misc. (EUCERIN) cream Apply 1 application  topically as needed (eczema).     triamcinolone cream (KENALOG) 0.1 % Apply 1 application topically 2 (two) times daily as needed (psoriasis). 454 g 0   TRULICITY 0.75 MG/0.5ML SOAJ INJECT 0.75MG  (0.5ML) UNDER THE SKIN ONCE A WEEK. 8 mL 0   Vitamin D, Cholecalciferol, 25 MCG (1000 UT) CAPS Take 2,000 Units by mouth daily.     meloxicam (MOBIC) 7.5 MG tablet Take 1 tablet by mouth daily as needed.     No facility-administered medications prior to visit.     Per HPI unless specifically indicated in ROS section below Review of Systems  Constitutional:  Negative for fatigue and fever.  HENT:  Negative for congestion.   Eyes:  Negative for pain.  Respiratory:  Negative for cough and shortness of breath.   Cardiovascular:  Negative for chest pain, palpitations and leg swelling.  Gastrointestinal:  Negative for abdominal pain.  Genitourinary:  Negative for dysuria and vaginal bleeding.  Musculoskeletal:  Negative for back pain.  Neurological:  Negative for syncope, light-headedness and headaches.  Psychiatric/Behavioral:  Negative for dysphoric mood.    Objective:  BP (!) 140/80 (BP Location: Left Arm, Patient Position: Sitting, Cuff Size: Normal)   Pulse 77   Temp 98.2 F (36.8 C) (Temporal)   Ht 5' 2.5" (1.588 m)   Wt 140 lb (63.5 kg)   SpO2 96%   BMI 25.20 kg/m   Wt Readings from Last 3 Encounters:  12/13/23 140 lb (63.5 kg)  11/07/23 143 lb 6.4 oz (65 kg)  10/18/23 139 lb (63 kg)      Physical Exam Constitutional:      General: She is not in acute distress.    Appearance: Normal appearance. She is well-developed. She is not ill-appearing or toxic-appearing.  HENT:     Head: Normocephalic.     Right Ear: Hearing, tympanic membrane, ear canal and external ear normal. Tympanic membrane is not erythematous, retracted or bulging.     Left Ear: Hearing, tympanic membrane, ear canal and external ear normal. Tympanic membrane is not  erythematous, retracted or bulging.     Nose: No mucosal edema or rhinorrhea.     Right Sinus: No maxillary sinus tenderness or frontal sinus tenderness.     Left Sinus: No maxillary sinus tenderness or frontal sinus tenderness.     Mouth/Throat:     Pharynx: Uvula midline.  Eyes:     General: Lids are normal. Lids are everted, no foreign bodies appreciated.     Conjunctiva/sclera: Conjunctivae normal.     Pupils: Pupils are equal, round, and reactive to light.  Neck:     Thyroid: No thyroid mass or thyromegaly.     Vascular: No carotid bruit.     Trachea: Trachea normal.  Cardiovascular:     Rate and Rhythm: Normal rate and regular rhythm.     Pulses: Normal pulses.     Heart sounds: Normal heart sounds, S1 normal and S2 normal. No murmur heard.    No friction rub. No gallop.  Pulmonary:     Effort: Pulmonary effort is normal. No tachypnea or respiratory distress.     Breath sounds: Normal breath sounds. No decreased breath sounds, wheezing, rhonchi or rales.  Abdominal:     General: Bowel sounds are normal.     Palpations: Abdomen is soft.     Tenderness: There is no abdominal tenderness.  Musculoskeletal:     Cervical back: Normal range of motion and neck supple.  Skin:    General: Skin is warm and dry.     Findings: No rash.  Neurological:     Mental Status: She is alert.  Psychiatric:        Mood and Affect: Mood is not anxious or depressed.        Speech: Speech normal.        Behavior: Behavior normal. Behavior is cooperative.        Thought Content: Thought content normal.        Judgment: Judgment normal.       Results for orders placed or performed in visit on 12/06/23  Microalbumin / creatinine urine ratio   Collection Time: 12/06/23  9:45 AM  Result Value Ref Range   Microalb, Ur 3.0 (H) 0.0 - 1.9 mg/dL   Creatinine,U 161.0 mg/dL   Microalb Creat Ratio 2.0 0.0 - 30.0 mg/g  Hemoglobin A1c   Collection Time: 12/06/23  9:45 AM  Result Value Ref Range   Hgb  A1c MFr Bld 6.8 (H) 4.6 - 6.5 %  Lipid panel   Collection Time: 12/06/23  9:45 AM  Result Value Ref Range   Cholesterol 114 0 - 200 mg/dL   Triglycerides 960.4 0.0 - 149.0 mg/dL   HDL 54.09 >81.19 mg/dL   VLDL 14.7 0.0 - 82.9 mg/dL   LDL Cholesterol 41 0 - 99 mg/dL   Total CHOL/HDL Ratio 2    NonHDL 65.07   Comprehensive metabolic panel   Collection Time: 12/06/23  9:45 AM  Result Value Ref Range   Sodium 142 135 - 145 mEq/L   Potassium 4.3 3.5 - 5.1 mEq/L   Chloride 104 96 - 112 mEq/L   CO2 31 19 - 32 mEq/L   Glucose, Bld 121 (H) 70 - 99 mg/dL   BUN 25 (H) 6 - 23 mg/dL   Creatinine, Ser 5.62 0.40 - 1.20 mg/dL   Total Bilirubin 0.4 0.2 - 1.2 mg/dL   Alkaline Phosphatase 67 39 - 117 U/L   AST 21 0 - 37 U/L   ALT 15 0 - 35 U/L   Total Protein 7.1 6.0 - 8.3 g/dL   Albumin 4.4 3.5 - 5.2 g/dL   GFR 13.08 >65.78 mL/min   Calcium 9.4 8.4 - 10.5 mg/dL     COVID 19 screen:  No recent travel or known exposure to COVID19 The patient denies respiratory symptoms of COVID 19 at this time. The importance of social distancing was discussed today.   Assessment and Plan The patient's preventative maintenance and recommended screening tests for an annual wellness exam were reviewed in full today. Brought up to date unless services declined.  Counselled on the importance of diet, exercise, and its role in overall health and mortality. The patient's FH and SH was reviewed,  including their home life, tobacco status, and drug and alcohol status.    Vaccines:  Flu , PNA and COVID x 3 uptodate . Shingles, TD - considering Foot exam - DONE  Colon cancer screening - colonoscopy 04/2017, plan repeat in 5 years per pt request.   Mammo: 12/2022 e Bone density - 12/2021 Hep C screening - done  Nonsmoker  Problem List Items Addressed This Visit     Adult hypothyroidism   Stable, chronic.  Continue current medication.  stable control last check on levothyroxine 75 mcg daily.      Controlled  type 2 diabetes mellitus with circulatory disorder ( HTN)  (HCC)   Chronic, associated with hypertension Well-controlled on Trulicity 0.75 mg weekly.  No side effects         Hordeolum externum of left upper eyelid   Hyperlipidemia associated with type 2 diabetes mellitus (HCC)   Chronic, improved control LDL goal < 70 given CAD.   She continues Zetia 10 mg p.o. daily.  She is intolerant of statins given myopathy.      Hypertension associated with diabetes (HCC) - Primary   Inadequate control, chronic.  Continue current medication.  Losartan 50 mg p.o. daily      Other Visit Diagnoses       Encounter for screening colonoscopy       Relevant Orders   Ambulatory referral to Gastroenterology          Kerby Nora, MD

## 2023-12-13 NOTE — Patient Instructions (Addendum)
Get back  on track with regular exercise and low carb diet.  Referral place to discuss colonoscopy with GI.  Warm compresses 2-3 times daily  apply topical antibiotic ointment.

## 2023-12-13 NOTE — Assessment & Plan Note (Signed)
Stable, chronic.  Continue current medication.  stable control last check on levothyroxine 75 mcg daily

## 2023-12-15 ENCOUNTER — Ambulatory Visit: Payer: Medicare Other

## 2023-12-15 VITALS — Ht 62.5 in | Wt 140.0 lb

## 2023-12-15 DIAGNOSIS — Z0000 Encounter for general adult medical examination without abnormal findings: Secondary | ICD-10-CM

## 2023-12-15 NOTE — Progress Notes (Signed)
Subjective:   Renee [name] is a 77 y.o. female who presents for Medicare Annual (Subsequent) preventive examination.  Visit Complete: Virtual I connected with  [name] on 12/15/23 by a audio enabled telemedicine application and verified that I am speaking with the correct person using two identifiers.  Patient Location: Home  Provider Location: Home Office  I discussed the limitations of evaluation and management by telemedicine. The patient expressed understanding and agreed to proceed.  Vital Signs: Because this visit was a virtual/telehealth visit, some criteria may be missing or patient reported. Any vitals not documented were not able to be obtained and vitals that have been documented are patient reported.  Patient Medicare AWV questionnaire was completed by the patient on (not done); I have confirmed that all information answered by patient is correct and no changes since this date.  Cardiac Risk Factors include: advanced age (>42men, >8 women);diabetes mellitus;hypertension;sedentary lifestyle    Objective:    Today's Vitals   12/15/23 1524 12/15/23 1528 12/15/23 1603  Weight: 140 lb (63.5 kg)    Height: 5' 2.5" (1.588 m)    PainSc:  2  2    Body mass index is 25.2 kg/m.     12/15/2023    3:42 PM 12/13/2022    2:55 PM 10/21/2022   10:18 AM 10/12/2022   11:48 AM 11/30/2021    1:21 PM 10/07/2020    3:56 PM 01/04/2020    9:18 AM  Advanced Directives  Does Patient Have a Medical Advance Directive? No No No No No Yes No  Type of Careers adviser;Living will   Copy of Healthcare Power of Attorney in Chart?      No - copy requested   Would patient like information on creating a medical advance directive?  No - Patient declined No - Patient declined  Yes (MAU/Ambulatory/Procedural Areas - Information given)      Current Medications (verified) Outpatient Encounter Medications as of 12/15/2023  Medication Sig   acetaminophen  (TYLENOL) 650 MG CR tablet Take 1,300 mg by mouth every 8 (eight) hours as needed for pain.   aspirin EC 81 MG tablet Take 81 mg by mouth daily.   Carboxymethylcellulose Sodium (THERATEARS OP) Place 1 drop into both eyes at bedtime as needed (dry eyes).   clobetasol ointment (TEMOVATE) 0.05 % APPLY TOPICALLY DAILY AS NEEDED FOR PSORIASIS FLARES   Coenzyme Q10 (COQ10) 200 MG CAPS Take 200 mg by mouth daily.   erythromycin ophthalmic ointment 3 times daily for 5 to 7 days   ezetimibe (ZETIA) 10 MG tablet TAKE 1 TABLET BY MOUTH DAILY   famotidine (PEPCID) 20 MG tablet Take 20 mg by mouth daily as needed for heartburn or indigestion.   Flaxseed, Linseed, (FLAX SEED OIL PO) Take 1 capsule by mouth daily.   fluticasone (FLONASE) 50 MCG/ACT nasal spray Place 1 spray into both nostrils daily as needed for allergies.   levothyroxine (SYNTHROID) 75 MCG tablet TAKE 1 TABLET BY MOUTH EVERY DAY   losartan (COZAAR) 50 MG tablet TAKE 1 TABLET BY MOUTH EVERY DAY   MAGNESIUM PO Take 1 tablet by mouth daily.   Multiple Vitamin (MULTIVITAMIN) tablet Take 1 tablet by mouth daily.   Skin Protectants, Misc. (EUCERIN) cream Apply 1 application  topically as needed (eczema).   triamcinolone cream (KENALOG) 0.1 % Apply 1 application topically 2 (two) times daily as needed (psoriasis).   TRULICITY 0.75 MG/0.5ML SOAJ INJECT 0.75MG  (0.5ML)  UNDER THE SKIN ONCE A WEEK.   Vitamin D, Cholecalciferol, 25 MCG (1000 UT) CAPS Take 2,000 Units by mouth daily.   No facility-administered encounter medications on file as of 12/15/2023.    Allergies (verified) Celexa [citalopram], Other, Codeine, Metformin, Robaxin [methocarbamol], Tizanidine, Crestor [rosuvastatin], Nickel, and Simvastatin   History: Past Medical History:  Diagnosis Date   Allergy    Anxiety    Arthritis    CAD (coronary artery disease)    non-obstructive. cath 11/11   Claustrophobia    Diabetes mellitus    borderline diabetic - diet controlled    GERD  (gastroesophageal reflux disease)    History of chicken pox    History of phobia    clastrophobia   Hyperlipidemia    Hypertension    Hypothyroidism    Kidney stones    hx of    Lichen sclerosus    Psoriasis    Stroke (HCC)    Urinary tract infection    hx of    Past Surgical History:  Procedure Laterality Date   APPENDECTOMY     CARDIAC CATHETERIZATION     2011   CARPAL TUNNEL RELEASE     Left trigger finger   CATARACT EXTRACTION W/PHACO Left 06/06/2018   Procedure: CATARACT EXTRACTION PHACO AND INTRAOCULAR LENS PLACEMENT (IOC);  Surgeon: Renee [name];  Location: [facility] ORS;  Service: Ophthalmology;  Laterality: Left;  [place]  00:23 AP% 16.0 CDE 3.69 Fluid pack lot # [id number]   CATARACT EXTRACTION W/PHACO Right 09/19/2018   Procedure: CATARACT EXTRACTION PHACO AND INTRAOCULAR LENS PLACEMENT (IOC);  Surgeon: Renee [name];  Location: [facility] ORS;  Service: Ophthalmology;  Laterality: Right;  [place] 00:29.5 AP% 17.0 CDE$ 5.03 Fluid pack lot # [id number]   COLONOSCOPY WITH PROPOFOL N/A 05/13/2017   Procedure: COLONOSCOPY WITH PROPOFOL;  Surgeon: Renee [name];  Location: [facility] ENDOSCOPY;  Service: Endoscopy;  Laterality: N/A;   ESOPHAGOGASTRODUODENOSCOPY (EGD) WITH PROPOFOL N/A 01/04/2020   Procedure: ESOPHAGOGASTRODUODENOSCOPY (EGD) WITH PROPOFOL;  Surgeon: Renee [name];  Location: [facility] ENDOSCOPY;  Service: Gastroenterology;  Laterality: N/A;   EYE SURGERY  2004   lasik - right eye    JOINT REPLACEMENT     KNEE SURGERY Right 2015   arthroscopy   REVERSE SHOULDER ARTHROPLASTY Right 10/21/2022   Procedure: REVERSE SHOULDER ARTHROPLASTY;  Surgeon: Renee [name];  Location: [facility] ORS;  Service: Orthopedics;  Laterality: Right;    TOTAL KNEE ARTHROPLASTY Bilateral 06/12/2015   Procedure: TOTAL KNEE BILATERAL;  Surgeon: Renee [name];  Location: [facility] ORS;  Service: Orthopedics;  Laterality: Bilateral;  and epidural   TUBAL LIGATION     Tubes tied     and  untied. and tied again   Family History  Problem Relation Age of Onset   COPD Mother    Diabetes Mother    Hypertension Mother    Hypertension Father    Diabetes Sister    Hypertension Sister    Diabetes Brother    Arthritis Brother    Heart disease Brother    Arthritis Brother    Diabetes Brother    Heart disease Brother    Stomach cancer Maternal Grandfather    Stroke Maternal Grandfather    Breast cancer Daughter 58       Double mastectomy   Social History   Socioeconomic History   Marital status: Divorced    Spouse name: Not on file   Number of children: 6   Years of education: Not on file  Highest education level: Not on file  Occupational History   Occupation: Retired  Tobacco Use   Smoking status: Never   Smokeless tobacco: Never  Vaping Use   Vaping status: Never Used  Substance and Sexual Activity   Alcohol use: Yes    Comment: rare   Drug use: No   Sexual activity: Not Currently    Birth control/protection: Post-menopausal  Other Topics Concern   Not on file  Social History Narrative   Lives alone   Right Handed   Drink 1 cups caffeine daily   Social Drivers of Health   Financial Resource Strain: Low Risk  (12/15/2023)   Overall Financial Resource Strain (CARDIA)    Difficulty of Paying Living Expenses: Not very hard  Food Insecurity: No Food Insecurity (12/15/2023)   Hunger Vital Sign    Worried About Running Out of Food in the Last Year: Never true    Ran Out of Food in the Last Year: Never true  Transportation Needs: No Transportation Needs (12/15/2023)   PRAPARE - Administrator, Civil Service (Medical): No    Lack of Transportation (Non-Medical): No  Physical Activity: Insufficiently Active (12/15/2023)   Exercise Vital Sign    Days of Exercise per Week: 3 days    Minutes of Exercise per Session: 30 min  Stress: No Stress Concern Present (12/15/2023)   [employer] of Occupational Health - Occupational Stress  Questionnaire    Feeling of Stress : Not at all  Social Connections: Moderately Isolated (12/15/2023)   Social Connection and Isolation Panel [NHANES]    Frequency of Communication with Friends and Family: More than three times a week    Frequency of Social Gatherings with Friends and Family: More than three times a week    Attends Religious Services: 1 to 4 times per year    Active Member of [employer] or Organizations: No    Attends Engineer, structural: Never    Marital Status: Divorced    Tobacco Counseling Counseling given: Not Answered  Clinical Intake:  Pre-visit preparation completed: Yes  Pain : 0-10 Pain Score: 2  Pain Type: Acute pain Pain Location: Eye Pain Orientation: Left Pain Descriptors / Indicators: Burning Pain Onset: In the past 7 days Pain Frequency: Constant Pain Relieving Factors: ouq, warm compresses  Pain Relieving Factors: ouq, warm compresses  BMI - recorded: 25.2 Nutritional Status: BMI 25 -29 Overweight Nutritional Risks: None Diabetes: Yes CBG done?: No Did pt. bring in CBG monitor from home?: No  How often do you need to have someone help you when you read instructions, pamphlets, or other written materials from your doctor or pharmacy?: 3 - Sometimes  Interpreter Needed?: No  Comments: lives alone Information entered by :: B.Jaslene Marsteller,LPN   Activities of Daily Living    12/15/2023    3:43 PM  In your present state of health, do you have any difficulty performing the following activities:  Hearing? 0  Vision? 0  Difficulty concentrating or making decisions? 0  Walking or climbing stairs? 0  Dressing or bathing? 0  Doing errands, shopping? 0  Preparing Food and eating ? N  Using the Toilet? N  In the past six months, have you accidently leaked urine? Y  Do you have problems with loss of bowel control? N  Managing your Medications? N  Managing your Finances? N  Housekeeping or managing your Housekeeping? N    Patient  Care Team: Excell Seltzer, [name] as PCP - General (Family  Medicine) Renee [name] as Consulting Physician (Orthopedic Surgery) Antonieta Iba, [name] as Consulting Physician (Cardiology) Dasher, Cliffton Asters, [name] as Consulting Physician (Dermatology) Renee [name] as Referring Physician (Ophthalmology) Kathyrn Sheriff, [facility] (Inactive) as Pharmacist (Pharmacist)  Indicate any recent Medical Services you may have received from other than [facility] providers in the past year (date may be approximate).     Assessment:   This is a routine wellness examination for [place].  Hearing/Vision screen Hearing Screening - Comments:: Pt says her hearing is good  Vision Screening - Comments:: Pt says her vision is good;rt eye diminished some [facility]   Goals Addressed             This Visit's Progress    COMPLETED: Patient Stated   On track    Starting 09/25/2018, I will continue to take medications as prescribed.      Patient Stated   Not on track    Would like to continue to walk-will start do do this again       Depression Screen    12/15/2023    3:38 PM 12/13/2023   12:09 PM 12/13/2022    2:46 PM 12/07/2022    3:22 PM 12/01/2021    3:17 PM 11/30/2021    1:27 PM 05/29/2021   11:24 AM  PHQ 2/9 Scores  PHQ - 2 Score 0 0 0 0 2 0 0  PHQ- 9 Score  1 0 1 4  1     Fall Risk    12/15/2023    3:33 PM 12/13/2023   11:01 AM 12/13/2022    2:56 PM 11/30/2021    1:25 PM 10/07/2020    3:58 PM  Fall Risk   Falls in the past year? 1 1 0 0 1  Number falls in past yr: 1 0 0 0 1  Injury with Fall? 0 0 0 0 0  Risk for fall due to : No Fall Risks History of fall(s)  No Fall Risks Medication side effect  Follow up Education provided;Falls prevention discussed Falls evaluation completed Falls evaluation completed;Falls prevention discussed Falls prevention discussed Falls evaluation completed;Falls prevention discussed    MEDICARE RISK AT HOME: Medicare Risk at Home Any stairs in or  around the home?: Yes If so, are there any without handrails?: Yes Home free of loose throw rugs in walkways, pet beds, electrical cords, etc?: Yes Adequate lighting in your home to reduce risk of falls?: Yes Life alert?: No Use of a cane, walker or w/c?: No Grab bars in the bathroom?: Yes Shower chair or bench in shower?: Yes Elevated toilet seat or a handicapped toilet?: Yes  TIMED UP AND GO:  Was the test performed?  No    Cognitive Function:    06/20/2023    2:51 PM 11/29/2022    3:41 PM 03/16/2022    1:05 PM 09/16/2021   10:38 AM 10/07/2020    4:05 PM  MMSE - Mini Mental State Exam  Orientation to time 5 4 5 4 5   Orientation to time comments  said winter instead fall     Orientation to Place 4 5 5 5 5   Registration 3 3 3 3 3   Attention/ Calculation 1 3 0 2 5  Recall 3 3 2 3 3   Language- name 2 objects 2 2 2 2    Language- repeat 1 0 0 1 1  Language- follow 3 step command 3 3 2 3    Language- read & follow direction 1 1  1 1   Write a sentence 1 1 1 1    Copy design 0 0 0 1   Total score 24 25 21 26          12/15/2023    3:49 PM 12/13/2022    3:02 PM  6CIT Screen  What Year? 0 points 0 points  What month? 0 points 0 points  What time? 0 points 0 points  Count back from 20 0 points 0 points  Months in reverse 0 points 0 points  Repeat phrase 0 points 2 points  Total Score 0 points 2 points    Immunizations Immunization History  Administered Date(s) Administered   Fluad Quad(high Dose 65+) 08/28/2022   H1N1 11/02/2008   Influenza Split 10/22/2006, 10/28/2007, 09/26/2008, 09/13/2009, 09/12/2010, 09/18/2011, 09/27/2012   Influenza, High Dose Seasonal PF 09/11/2016, 08/28/2019, 09/22/2020, 10/03/2021   Influenza,inj,Quad PF,6+ Mos 09/13/2013, 09/13/2014, 09/20/2017, 09/25/2018   Influenza-Unspecified 08/27/2014, 09/18/2015   PFIZER(Purple Top)SARS-COV-2 Vaccination 01/03/2020, 01/26/2020, 11/05/2020   Pneumococcal Conjugate-13 04/05/2014   Pneumococcal  Polysaccharide-23 09/26/2008, 02/08/2017   Tdap 09/27/2012    TDAP status: Up to date  Flu Vaccine status: Up to date  Pneumococcal vaccine status: Up to date  Covid-19 vaccine status: Completed vaccines  Qualifies for Shingles Vaccine? Yes   Zostavax completed No   Shingrix Completed?: No.    Education has been provided regarding the importance of this vaccine. Patient has been advised to call insurance company to determine out of pocket expense if they have not yet received this vaccine. Advised may also receive vaccine at local pharmacy or Health Dept. Verbalized acceptance and understanding.  Screening Tests Health Maintenance  Topic Date Due   COVID-19 Vaccine (4 - 2024-25 season) 12/31/2023 (Originally 08/28/2023)   Zoster Vaccines- Shingrix (1 of 2) 03/14/2024 (Originally 09[birth year]01/1994)   DTaP/Tdap/Td (2 - Td or Tdap) 12/14/2024 (Originally 09/27/2022)   Colonoscopy  12/14/2024 (Originally 05/13/2022)   MAMMOGRAM  01/21/2024   OPHTHALMOLOGY EXAM  06/01/2024   HEMOGLOBIN A1C  06/05/2024   FOOT EXAM  06/07/2024   Diabetic kidney evaluation - eGFR measurement  12/05/2024   Diabetic kidney evaluation - Urine ACR  12/05/2024   Medicare Annual Wellness (AWV)  12/14/2024   DEXA SCAN  12/31/2026   Pneumonia Vaccine 20+ Years old  Completed   INFLUENZA VACCINE  Completed   Hepatitis C Screening  Completed   HPV VACCINES  Aged Out    Health Maintenance  There are no preventive care reminders to display for this patient.   Colorectal cancer screening: No longer required.   Mammogram status: No longer required due to age.  Bone Density status: Completed 12/31/2021. Results reflect: Bone density results: OSTEOPENIA. Repeat every 3-5 years.  Lung Cancer Screening: (Low Dose CT Chest recommended if Age 49-80 years, 20 pack-year currently smoking OR have quit w/in 15years.) does not qualify.   Lung Cancer Screening Referral: NO  Additional Screening:  Hepatitis C Screening: does not  qualify; Completed no  Vision Screening: Recommended annual ophthalmology exams for early detection of glaucoma and other disorders of the eye. Is the patient up to date with their annual eye exam?  Yes  Who is the provider or what is the name of the office in which the patient attends annual eye exams? [facility] If pt is not established with a provider, would they like to be referred to a provider to establish care? No .   Dental Screening: Recommended annual dental exams for proper oral hygiene  Diabetic Foot  Exam: Diabetic Foot Exam: Completed 06/08/23  Community Resource Referral / Chronic Care Management: CRR required this visit?  No   CCM required this visit?  No    Plan:    I have personally reviewed and noted the following in the patient's chart:   Medical and social history Use of alcohol, tobacco or illicit drugs  Current medications and supplements including opioid prescriptions. Patient is not currently taking opioid prescriptions. Functional ability and status Nutritional status Physical activity Advanced directives List of other physicians Hospitalizations, surgeries, and ER visits in previous 12 months Vitals Screenings to include cognitive, depression, and falls Referrals and appointments  In addition, I have reviewed and discussed with patient certain preventive protocols, quality metrics, and best practice recommendations. A written personalized care plan for preventive services as well as general preventive health recommendations were provided to patient.    Sue Lush, LPN   [id number]/09/[id number]   After Visit Summary: (MyChart) Due to this being a telephonic visit, the after visit summary with patients personalized plan was offered to patient via MyChart   Nurse Notes: The patient states she is doing well and has no concerns or questions at this time.

## 2023-12-15 NOTE — Patient Instructions (Signed)
Ms. Cather , Thank you for taking time to come for your Medicare Wellness Visit. I appreciate your ongoing commitment to your health goals. Please review the following plan we discussed and let me know if I can assist you in the future.   Referrals/Orders/Follow-Ups/Clinician Recommendations: none  This is a list of the screening recommended for you and due dates:  Health Maintenance  Topic Date Due   Zoster (Shingles) Vaccine (1 of 2) Never done   Colon Cancer Screening  05/13/2022   DTaP/Tdap/Td vaccine (2 - Td or Tdap) 09/27/2022   COVID-19 Vaccine (4 - 2024-25 season) 08/28/2023   Mammogram  01/21/2024   Eye exam for diabetics  06/01/2024   Hemoglobin A1C  06/05/2024   Complete foot exam   06/07/2024   Yearly kidney function blood test for diabetes  12/05/2024   Yearly kidney health urinalysis for diabetes  12/05/2024   Medicare Annual Wellness Visit  12/14/2024   DEXA scan (bone density measurement)  12/31/2026   Pneumonia Vaccine  Completed   Flu Shot  Completed   Hepatitis C Screening  Completed   HPV Vaccine  Aged Out    Advanced directives: (Declined) Advance directive discussed with you today. Even though you declined this today, please call our office should you change your mind, and we can give you the proper paperwork for you to fill out.  Next Medicare Annual Wellness Visit scheduled for next year: Yes 12/14/2024 @ 3:40pm

## 2023-12-17 ENCOUNTER — Encounter: Payer: Self-pay | Admitting: *Deleted

## 2023-12-22 ENCOUNTER — Other Ambulatory Visit: Payer: Self-pay | Admitting: Family Medicine

## 2024-01-02 DIAGNOSIS — H0014 Chalazion left upper eyelid: Secondary | ICD-10-CM | POA: Diagnosis not present

## 2024-01-09 ENCOUNTER — Ambulatory Visit (INDEPENDENT_AMBULATORY_CARE_PROVIDER_SITE_OTHER): Payer: Medicare Other | Admitting: Adult Health

## 2024-01-09 ENCOUNTER — Encounter: Payer: Self-pay | Admitting: Adult Health

## 2024-01-09 VITALS — BP 163/85 | HR 81 | Ht 62.5 in | Wt 141.0 lb

## 2024-01-09 DIAGNOSIS — G3184 Mild cognitive impairment, so stated: Secondary | ICD-10-CM

## 2024-01-09 DIAGNOSIS — G459 Transient cerebral ischemic attack, unspecified: Secondary | ICD-10-CM | POA: Diagnosis not present

## 2024-01-09 NOTE — Progress Notes (Signed)
 [facility] [address] [place]. [place] [address] [phone]       OFFICE FOLLOW UP VISIT NOTE  [name] Date of Birth:  Nov 23, 1946 Medical Record Number:  [id number]   Referring MD:  Renee [name]  Reason for Referral:  TIA  Chief Complaint  Patient presents with   Follow-up    Pt with daughter, rm 3. Here for follow up. Overall doing stable. She started the Leqvio  injections and has had 2 injections. She has tolerated well. She has been keeping BP journal and overall has been in 125-130/70's.       HPI:  Update 01/09/2024 JM: Patient returns for stroke follow-up visit accompanied by her daughter.  Prior visit with Dr. Rosemarie 6 months ago.  At prior visit, she was started on Leqvio  injection in addition to Zetia  for HLD management, initial injection was on 8/9 and repeat injection on 11/11. Next injection scheduled 04/2024. Tolerating injection well. Most recent lipid panel 11/2023 with LDL 41, prior to starting Leqvio  LDL 121.  Also remains on aspirin 81 mg daily. Blood pressure today elevated which is typical when she goes to doctors appointments, routinely monitored at home and typically 120-130s/70s. Routinely follows with PCP for stroke risk factor management.  No new stroke/TIA symptoms. Reports cognition has been stable. Still lives alone, maintains ADL and IADLs independently, continues to drive without difficulty.  No further questions or concerns at this time.     History provided for reference purposes only Update 06/20/2023 Dr. Rosemarie: She returns for follow-up after last visit 6 months ago with Renee [name], nurse practitioner.  Patient is doing well.  She is still living alone and is independent.  She drives and pays her bills.  He denies any recurrent TIA or stroke symptoms.  She remains on aspirin which is tolerating well with minor bruising but no bleeding.  Blood pressure is under good control.  Remains on Zetia  for her lipids as she has statin  intolerance.  Last lipid profile on 06/01/2023 showed LDL cholesterol to be suboptimally controlled at 121 mg percent.  She has not yet tried Repatha or Praluent injections or Leqvio .  She has no new complaints today.  Mini-Mental status score today 24/30 which is stable from last visit   Update 11/29/2022 JM: Reports overall stable since prior visit accompanied by her daughters (one in office, one via video).  Denies any new or reoccurring stroke/TIA symptoms since prior visit  Reports cognition has been about the same since prior visit. Lives alone, able to maintain ADLs and IADLs independently. Continues to drive, no issues with this. Appears to do well with compensation strategies. Remains on aspirin and Zetia .  Blood pressure well controlled, elevated today but has had a busy day with appointments, occasionally monitors at home and typically stable.   Update 03/16/2022 Dr. Rosemarie; she returns for follow-up after last visit 6 months ago.  She is accompanied by her daughter.  Is doing well.  She continues to live alone and is independent in actives of daily living.  She is still driving.  She does not finances except online billing which her daughter handles.  She continues to have short-term memory difficulties but these are nonprogressive and unchanged.  She does write things down and uses a calendar.  She does not do many activities like solving puzzles or sudoku as she does not like it.  She has not had any stroke or TIA symptoms.  She remains on aspirin she is  tolerating well without bruising or bleeding.  Her blood pressure is under good control today it is 138/70.  She states her sugars are doing pretty good.  She saw her primary care physician and all lab work is satisfactory.  On Mini-Mental status testing by my assistant today she scored 21/30 but probably would not have a good time is on my testing she scored recall 2/3 and able to name 14 animals will can walk on 4 legs and clock drawing she scored  normal 4/4  Update 09/16/2021 Dr. Rosemarie: She returns for follow-up after last visit 2 and half months ago.  She is accompanied by her daughter Renee [name].  Patient continues to have memory difficulties mostly for short-term.  She still lives independently and manages most of her activities of daily living though her daughters live nearby and provide support.  She has had no further episodes of TIA or blurred vision no complicated migraine episodes.  Main complaint today is chronic right shoulder pain which limits her range of motion.  She has gotten some injection of steroids for chronic pain that has not helped.  She is currently doing some home therapy.  She had recent lab work on 08/24/2021 which showed borderline hemoglobin A1c of 6.6 and LDL cholesterol of 84 mg percent.  She remains on Zetia  and previously has been intolerant to Crestor  and Zocor.  She had CT angiogram of the brain and neck done on 07/17/2021 which showed no significant large vessel intracranial or extracranial stenosis.  There was 1 to 2 mm paraclinoid left ICA infundibulum versus tiny aneurysm noted.  Temporal artery ultrasound on 07/16/2021 was negative for halo sign.   Initial visit 06/25/2021 Dr. Darra. [name] is a pleasant 78 year old Caucasian lady who is referred today for initial office consultation visit for TIA.  History is obtained from the patient, her daughter as well as review of electronic medical records and I personally reviewed pertinent imaging films in PACS.  She has a past medical history of coronary artery disease, diabetes, hypertension, hyperlipidemia, hypothyroidism and arthritis.  She states she had episode of sudden onset of blurred vision and dark vision when she woke up from sleep on 06/17/2021.  She was able to see but vision was not clear.  She checked her blood sugar which was fine.  She also noticed that she has some trouble thinking straight and trouble speaking.  She went to a restaurant where she had trouble  ordering her food eventually she was able to place an order.  She subsequently had a meal and sat in a car and while driving to the gynecologist office noticed numbness in the right hand from the wrist down.  She also felt that her right cheek of her cheek was being pulled to the side.  When she continues with annual gynecology she advised her to go to the ER.  Her symptoms are completely resolved by the time she reached there.  Patient denied any accompanying headache, double vision, vertigo, gait or balance problems.  Patient said she had a somewhat similar episode a couple of years ago when she also had some difficulty with vision, focusing as well as trouble thinking and word finding which also did not stay long.  She did not have a headache after that.  She did not seek medical help at that time.  Patient does have a history of remote migraines but she states she is outgrown them and has not had one for years.  2 of her daughters  also have migraines.  She denies any other prior history of strokes or TIAs or seizures.  She had a CT scan of the head on 06/17/2021 which showed possibly a right basal ganglia lacunar however an MRI scan done the next day showed no evidence of acute stroke or old strokes and only showed age-related atrophy and white matter changes.  Lab work on 04/27/2021 showed hemoglobin A1c of 7.3 and LDL cholesterol 140 mg percent.  Patient also has complaints of headache with posterior neck pain with tenderness in the left occipital groove.  She also complains of muscle aches and pains and in fact has an appointment soon to see rheumatologist Dr. Dolphus for these complaints..      ROS:   14 system review of systems is positive for those listed in HPI and neck pain and all other systems negative  PMH:  Past Medical History:  Diagnosis Date   Allergy    Anxiety    Arthritis    CAD (coronary artery disease)    non-obstructive. cath 11/11   Claustrophobia    Diabetes mellitus     borderline diabetic - diet controlled    GERD (gastroesophageal reflux disease)    History of chicken pox    History of phobia    clastrophobia   Hyperlipidemia    Hypertension    Hypothyroidism    Kidney stones    hx of    Lichen sclerosus    Psoriasis    Stroke (HCC)    Urinary tract infection    hx of     Social History:  Social History   Socioeconomic History   Marital status: Divorced    Spouse name: Not on file   Number of children: 6   Years of education: Not on file   Highest education level: Not on file  Occupational History   Occupation: Retired  Tobacco Use   Smoking status: Never   Smokeless tobacco: Never  Vaping Use   Vaping status: Never Used  Substance and Sexual Activity   Alcohol use: Yes    Comment: rare   Drug use: No   Sexual activity: Not Currently    Birth control/protection: Post-menopausal  Other Topics Concern   Not on file  Social History Narrative   Lives alone   Right Handed   Drink 1 cups caffeine daily   Social Drivers of Health   Financial Resource Strain: Low Risk  (12/15/2023)   Overall Financial Resource Strain (CARDIA)    Difficulty of Paying Living Expenses: Not very hard  Food Insecurity: No Food Insecurity (12/15/2023)   Hunger Vital Sign    Worried About Running Out of Food in the Last Year: Never true    Ran Out of Food in the Last Year: Never true  Transportation Needs: No Transportation Needs (12/15/2023)   PRAPARE - Administrator, Civil Service (Medical): No    Lack of Transportation (Non-Medical): No  Physical Activity: Insufficiently Active (12/15/2023)   Exercise Vital Sign    Days of Exercise per Week: 3 days    Minutes of Exercise per Session: 30 min  Stress: No Stress Concern Present (12/15/2023)   [employer] of Occupational Health - Occupational Stress Questionnaire    Feeling of Stress : Not at all  Social Connections: Moderately Isolated (12/15/2023)   Social Connection and  Isolation Panel [NHANES]    Frequency of Communication with Friends and Family: More than three times a week    Frequency of  Social Gatherings with Friends and Family: More than three times a week    Attends Religious Services: 1 to 4 times per year    Active Member of [employer] or Organizations: No    Attends Banker Meetings: Never    Marital Status: Divorced  Catering Manager Violence: Not At Risk (12/15/2023)   Humiliation, Afraid, Rape, and Kick questionnaire    Fear of Current or Ex-Partner: No    Emotionally Abused: No    Physically Abused: No    Sexually Abused: No    Medications:   Current Outpatient Medications on File Prior to Visit  Medication Sig Dispense Refill   acetaminophen  (TYLENOL ) 650 MG CR tablet Take 1,300 mg by mouth every 8 (eight) hours as needed for pain.     aspirin EC 81 MG tablet Take 81 mg by mouth daily.     Carboxymethylcellulose Sodium (THERATEARS OP) Place 1 drop into both eyes at bedtime as needed (dry eyes).     clobetasol  ointment (TEMOVATE ) 0.05 % APPLY TOPICALLY DAILY AS NEEDED FOR PSORIASIS FLARES 60 g 0   Coenzyme Q10 (COQ10) 200 MG CAPS Take 200 mg by mouth daily.     erythromycin  ophthalmic ointment 3 times daily for 5 to 7 days 3.5 g 0   ezetimibe  (ZETIA ) 10 MG tablet TAKE 1 TABLET BY MOUTH DAILY 90 tablet 3   famotidine (PEPCID) 20 MG tablet Take 20 mg by mouth daily as needed for heartburn or indigestion.     Flaxseed, Linseed, (FLAX SEED OIL PO) Take 1 capsule by mouth daily.     fluticasone (FLONASE) 50 MCG/ACT nasal spray Place 1 spray into both nostrils daily as needed for allergies.     Inclisiran Sodium  (LEQVIO  [place]) Inject into the skin every 6 (six) months. Next dose due in May     levothyroxine  (SYNTHROID ) 75 MCG tablet TAKE 1 TABLET BY MOUTH EVERY DAY 90 tablet 1   losartan  (COZAAR ) 50 MG tablet TAKE 1 TABLET BY MOUTH EVERY DAY 90 tablet 1   MAGNESIUM PO Take 1 tablet by mouth daily.     Multiple Vitamin (MULTIVITAMIN)  tablet Take 1 tablet by mouth daily.     Skin Protectants, Misc. (EUCERIN) cream Apply 1 application  topically as needed (eczema).     triamcinolone  cream (KENALOG ) 0.1 % Apply 1 application topically 2 (two) times daily as needed (psoriasis). 454 g 0   TRULICITY  0.75 MG/0.5ML SOAJ INJECT 0.75MG  (0.5ML) UNDER THE SKIN ONCE A WEEK. 8 mL 0   Vitamin D , Cholecalciferol, 25 MCG (1000 UT) CAPS Take 2,000 Units by mouth daily.     No current facility-administered medications on file prior to visit.    Allergies:   Allergies  Allergen Reactions   Celexa  [Citalopram ] Shortness Of Breath and Swelling    Throat swelling and difficulty breathing   Other Rash    Metal    Codeine Nausea And Vomiting and Other (See Comments)   Metformin Nausea And Vomiting   Robaxin  [Methocarbamol ] Nausea Only   Tizanidine  Other (See Comments)    Oversedated   Crestor  [Rosuvastatin ] Rash   Nickel Rash   Simvastatin Swelling and Rash       Physical Exam Today's Vitals   01/09/24 1344  BP: (!) 163/85  Pulse: 81  Weight: 141 lb (64 kg)  Height: 5' 2.5 (1.588 m)    Body mass index is 25.38 kg/m.  General: well developed, well nourished very pleasant elderly Caucasian lady, seated, in no evident  distress Head: head normocephalic and atraumatic.   Neck: supple with no carotid or supraclavicular bruits Cardiovascular: regular rate and rhythm, no murmurs Musculoskeletal: no deformity  Skin:  no rash/petichiae Vascular:  Normal pulses all extremities   Neurologic Exam Mental Status: Awake and fully alert. Oriented to place and time. Recent memory impaired and remote memory intact. Attention span, concentration and fund of knowledge appropriate. Mood and affect appropriate.  Cranial Nerves: Pupils equal, briskly reactive to light. Extraocular movements full without nystagmus. Visual fields full to confrontation. Hearing intact. Facial sensation intact. Face, tongue, palate moves normally and  symmetrically.  Motor: Normal bulk and tone. Normal strength in all tested extremity muscles. Sensory.: intact to touch , pinprick , position and vibratory sensation.  Coordination: Rapid alternating movements normal in all extremities. Finger-to-nose and heel-to-shin performed accurately bilaterally. Gait and Station: Arises from chair without difficulty. Stance is normal. Gait demonstrates normal stride length and balance without use of AD Reflexes: 1+ and symmetric. Toes downgoing.       06/20/2023    2:51 PM 11/29/2022    3:41 PM 03/16/2022    1:05 PM  MMSE - Mini Mental State Exam  Orientation to time 5 4 5   Orientation to time comments  said winter instead fall   Orientation to Place 4 5 5   Registration 3 3 3   Attention/ Calculation 1 3 0  Recall 3 3 2   Language- name 2 objects 2 2 2   Language- repeat 1 0 0  Language- follow 3 step command 3 3 2   Language- read & follow direction 1 1 1   Write a sentence 1 1 1   Copy design 0 0 0  Total score 24 25 21         ASSESSMENT/PLAN: 78 year old pleasant Caucasian lady with transient episode of vision and cognitive difficulties, numbness possibly posterior circulation TIAs versus atypical migraine.  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  Mild cognitive impairment -Subjectively, memory stable -MMSE 24/30 (05/2023) - will repeat MMSE at follow up visit  -can consider use of Namenda or Aricept in the future if needed but as cognition has been stable and nonprogressive, would recommend holding off at this time as she does have sensitivities to multiple medications -participation in cognitively challenging activities like solving crossword puzzles, playing bridge and sudoku.   -Continue memory compensation strategies.    2.  History of TIAs -Continue aspirin, Leqvio  and Zetia  for stroke prevention and maintain aggressive risk factor modification strict control of  hypertension with blood pressure goal below 130/90, lipids with LDL cholesterol goal below 70 mg percent and diabetes with hemoglobin A1c goal below 6.5% -Stroke labs 11/2023: LDL 41, A1c 6.8 -request repeat lipid panel in June with PCP - if LDL remains below 70, can consider stopping Zetia     Follow-up in 6 months or call earlier if needed    I spent 30 minutes of face-to-face and non-face-to-face time with patient and daughter.  This included previsit chart review, lab review, study review, order entry, electronic health record documentation, patient and daughter education and discussion regarding above diagnoses and treatment plan and answered all the questions to patient and daughter satisfaction  Renee [name] Bogaert, [facility]  [facility] [address] [place], [place] [address]  Phone [phone] Fax [phone] Note: This document was prepared with digital dictation and possible smart phrase technology. Any transcriptional errors that result from this process are unintentional.

## 2024-01-09 NOTE — Patient Instructions (Addendum)
 Continue aspirin 81 mg daily, Leqvio  and Zetia  for secondary stroke prevention  Will request repeat cholesterol levels in June with your PCP - if levels still look good, can consider stopping Zetia    Continue to follow up with PCP regarding blood pressure and cholesterol management  Maintain strict control of hypertension with blood pressure goal below 130/90 and cholesterol with LDL cholesterol (bad cholesterol) goal below 70 mg/dL.   Signs of a Stroke? Follow the BEFAST method:  Balance Watch for a sudden loss of balance, trouble with coordination or vertigo Eyes Is there a sudden loss of vision in one or both eyes? Or double vision?  Face: Ask the person to smile. Does one side of the face droop or is it numb?  Arms: Ask the person to raise both arms. Does one arm drift downward? Is there weakness or numbness of a leg? Speech: Ask the person to repeat a simple phrase. Does the speech sound slurred/strange? Is the person confused ? Time: If you observe any of these signs, call 911.      Followup in the future with me in 6 months or call earlier if needed       Thank you for coming to see us  at [facility]. I hope we have been able to provide you high quality care today.  You may receive a patient satisfaction survey over the next few weeks. We would appreciate your feedback and comments so that we may continue to improve ourselves and the health of our patients.

## 2024-01-23 DIAGNOSIS — Z1231 Encounter for screening mammogram for malignant neoplasm of breast: Secondary | ICD-10-CM | POA: Diagnosis not present

## 2024-01-23 LAB — HM MAMMOGRAPHY

## 2024-01-24 ENCOUNTER — Encounter: Payer: Self-pay | Admitting: Podiatry

## 2024-01-24 ENCOUNTER — Ambulatory Visit (INDEPENDENT_AMBULATORY_CARE_PROVIDER_SITE_OTHER): Payer: Medicare Other | Admitting: Podiatry

## 2024-01-24 ENCOUNTER — Encounter: Payer: Self-pay | Admitting: Family Medicine

## 2024-01-24 VITALS — Ht 62.5 in | Wt 141.0 lb

## 2024-01-24 DIAGNOSIS — Q828 Other specified congenital malformations of skin: Secondary | ICD-10-CM

## 2024-01-24 NOTE — Progress Notes (Signed)
Subjective:  Patient ID: Renee [name], female    DOB: [birth year],  MRN: [id number]  Chief Complaint  Patient presents with   Nail Problem    [facility]    78 y.o. female presents with the above complaint.  Patient presents with complaint of bilateral fifth metatarsal hyperkeratotic lesion.  Patient stated they are still very painful but the debridement does help.  She is in agreement thickened elongated dystrophic toenails x10 mild pain on palpation she would like for them to debride down to.   Review of Systems: Negative except as noted in the HPI. Denies N/V/F/Ch.  Past Medical History:  Diagnosis Date   Allergy    Anxiety    Arthritis    CAD (coronary artery disease)    non-obstructive. cath 11/11   Claustrophobia    Diabetes mellitus    borderline diabetic - diet controlled    GERD (gastroesophageal reflux disease)    History of chicken pox    History of phobia    clastrophobia   Hyperlipidemia    Hypertension    Hypothyroidism    Kidney stones    hx of    Lichen sclerosus    Psoriasis    Stroke (HCC)    Urinary tract infection    hx of     Current Outpatient Medications:    acetaminophen (TYLENOL) 650 MG CR tablet, Take 1,300 mg by mouth every 8 (eight) hours as needed for pain., Disp: , Rfl:    aspirin EC 81 MG tablet, Take 81 mg by mouth daily., Disp: , Rfl:    Carboxymethylcellulose Sodium (THERATEARS OP), Place 1 drop into both eyes at bedtime as needed (dry eyes)., Disp: , Rfl:    clobetasol ointment (TEMOVATE) 0.05 %, APPLY TOPICALLY DAILY AS NEEDED FOR PSORIASIS FLARES, Disp: 60 g, Rfl: 0   Coenzyme Q10 (COQ10) 200 MG CAPS, Take 200 mg by mouth daily., Disp: , Rfl:    erythromycin ophthalmic ointment, 3 times daily for 5 to 7 days, Disp: 3.5 g, Rfl: 0   ezetimibe (ZETIA) 10 MG tablet, TAKE 1 TABLET BY MOUTH DAILY, Disp: 90 tablet, Rfl: 3   famotidine (PEPCID) 20 MG tablet, Take 20 mg by mouth daily as needed for heartburn or indigestion., Disp: , Rfl:     Flaxseed, Linseed, (FLAX SEED OIL PO), Take 1 capsule by mouth daily., Disp: , Rfl:    fluticasone (FLONASE) 50 MCG/ACT nasal spray, Place 1 spray into both nostrils daily as needed for allergies., Disp: , Rfl:    Inclisiran Sodium (LEQVIO Milner), Inject into the skin every 6 (six) months. Next dose due in May, Disp: , Rfl:    levothyroxine (SYNTHROID) 75 MCG tablet, TAKE 1 TABLET BY MOUTH EVERY DAY, Disp: 90 tablet, Rfl: 1   losartan (COZAAR) 50 MG tablet, TAKE 1 TABLET BY MOUTH EVERY DAY, Disp: 90 tablet, Rfl: 1   MAGNESIUM PO, Take 1 tablet by mouth daily., Disp: , Rfl:    Multiple Vitamin (MULTIVITAMIN) tablet, Take 1 tablet by mouth daily., Disp: , Rfl:    Skin Protectants, Misc. (EUCERIN) cream, Apply 1 application  topically as needed (eczema)., Disp: , Rfl:    triamcinolone cream (KENALOG) 0.1 %, Apply 1 application topically 2 (two) times daily as needed (psoriasis)., Disp: 454 g, Rfl: 0   TRULICITY 0.75 MG/0.5ML SOAJ, INJECT 0.75MG  (0.5ML) UNDER THE SKIN ONCE A WEEK., Disp: 8 mL, Rfl: 0   Vitamin D, Cholecalciferol, 25 MCG (1000 UT) CAPS, Take 2,000 Units by mouth daily.,  Disp: , Rfl:   Social History   Tobacco Use  Smoking Status Never  Smokeless Tobacco Never    Allergies  Allergen Reactions   Celexa [Citalopram] Shortness Of Breath and Swelling    Throat swelling and difficulty breathing   Other Rash    Metal    Codeine Nausea And Vomiting and Other (See Comments)   Metformin Nausea And Vomiting   Robaxin [Methocarbamol] Nausea Only   Tizanidine Other (See Comments)    Oversedated   Crestor [Rosuvastatin] Rash   Nickel Rash   Simvastatin Swelling and Rash   Objective:   There were no vitals filed for this visit.  Body mass index is 25.38 kg/m. Constitutional Well developed. Well nourished.  Vascular Dorsalis pedis pulses palpable bilaterally. Posterior tibial pulses palpable bilaterally. Capillary refill normal to all digits.  No cyanosis or clubbing  noted. Pedal hair growth normal.  Neurologic Normal speech. Oriented to person, place, and time. Epicritic sensation to light touch grossly present bilaterally.  Dermatologic Nail Exam: Pt has thick disfigured discolored nails with subungual debris noted bilateral entire nail hallux through fifth toenails.  Pain on palpation to the nails. No open wounds. No skin lesions.  Orthopedic: Bilateral submetatarsal 5 porokeratotic/benign skin lesion with central nucleated core noted.  Upon debridement no pinpoint bleeding noted.  No wounds noted.   Radiographs:   3 views of skeletally mature adult bilateral foot: Midfoot arthritis noted.  Plantar and posterior heel spurring noted.  Plantarflexed fifth metatarsal noted with mild signs of tailor's bunion.  There is increase in lateral deviation angle. Assessment:   1. Porokeratosis         Plan:  Patient was evaluated and treated and all questions answered.  Bilateral submetatarsal 5 porokeratosis x2 -X-ray to the patient the etiology of porokeratosis and was treatment options were extensively discussed.  Given the amount of pain she is having I believe she will benefit from debridement of the lesion followed by excision of the lesion.  I discussed this with the patient she states understanding like to proceed with that. -Using chisel blade and handle the lesion was debrided down to healthy striated tissue followed by excision of central nucleated core.  Immediate pain was noted after debridement and excision.  No pinpoint bleeding noted.  Onychomycosis with pain  -Nails palliatively debrided as below. -Educated on self-care  Procedure: Nail Debridement Rationale: pain  Type of Debridement: manual, sharp debridement. Instrumentation: Nail nipper, rotary burr. Number of Nails: 10  Procedures and Treatment: Consent by patient was obtained for treatment procedures. The patient understood the discussion of treatment and procedures well. All  questions were answered thoroughly reviewed. Debridement of mycotic and hypertrophic toenails, 1 through 5 bilateral and clearing of subungual debris. No ulceration, no infection noted.  Return Visit-Office Procedure: Patient instructed to return to the office for a follow up visit 3 months for continued evaluation and treatment.  Nicholes Rough, DPM    No follow-ups on file.     No follow-ups on file.

## 2024-02-11 ENCOUNTER — Other Ambulatory Visit: Payer: Self-pay | Admitting: Family Medicine

## 2024-03-13 DIAGNOSIS — D2262 Melanocytic nevi of left upper limb, including shoulder: Secondary | ICD-10-CM | POA: Diagnosis not present

## 2024-03-13 DIAGNOSIS — X32XXXA Exposure to sunlight, initial encounter: Secondary | ICD-10-CM | POA: Diagnosis not present

## 2024-03-13 DIAGNOSIS — D2271 Melanocytic nevi of right lower limb, including hip: Secondary | ICD-10-CM | POA: Diagnosis not present

## 2024-03-13 DIAGNOSIS — D2261 Melanocytic nevi of right upper limb, including shoulder: Secondary | ICD-10-CM | POA: Diagnosis not present

## 2024-03-13 DIAGNOSIS — L570 Actinic keratosis: Secondary | ICD-10-CM | POA: Diagnosis not present

## 2024-03-13 DIAGNOSIS — D225 Melanocytic nevi of trunk: Secondary | ICD-10-CM | POA: Diagnosis not present

## 2024-03-13 DIAGNOSIS — L853 Xerosis cutis: Secondary | ICD-10-CM | POA: Diagnosis not present

## 2024-03-13 DIAGNOSIS — L814 Other melanin hyperpigmentation: Secondary | ICD-10-CM | POA: Diagnosis not present

## 2024-03-13 DIAGNOSIS — Z85828 Personal history of other malignant neoplasm of skin: Secondary | ICD-10-CM | POA: Diagnosis not present

## 2024-03-22 ENCOUNTER — Other Ambulatory Visit: Payer: Self-pay | Admitting: Family Medicine

## 2024-04-19 ENCOUNTER — Other Ambulatory Visit: Payer: Self-pay | Admitting: Family Medicine

## 2024-04-19 DIAGNOSIS — E1159 Type 2 diabetes mellitus with other circulatory complications: Secondary | ICD-10-CM

## 2024-04-19 MED ORDER — TRULICITY 0.75 MG/0.5ML ~~LOC~~ SOAJ: 0.7500 mg | SUBCUTANEOUS | 3 refills | Status: DC

## 2024-04-19 NOTE — Telephone Encounter (Signed)
 Refill sent as requested.

## 2024-04-19 NOTE — Telephone Encounter (Signed)
 Copied from CRM [id number]. Topic: Clinical - Medication Refill >> Apr 19, 2024  1:47 PM Felizardo Hotter wrote: Most Recent Primary Care Visit:  Provider: Nerissa Bannister  Department: [facility]  Visit Type: MEDICARE AWV, SEQUENTIAL  Date: 12/15/2023  Medication: TRULICITY  0.75 MG/0.5ML SOAJ  Has the patient contacted their pharmacy? Yes (Agent: If no, request that the patient contact the pharmacy for the refill. If patient does not wish to contact the pharmacy document the reason why and proceed with request.) (Agent: If yes, when and what did the pharmacy advise?)  Is this the correct pharmacy for this prescription? Yes If no, delete pharmacy and type the correct one.  This is the patient's preferred pharmacy:   [facility] - [place], [place] - [address] [place] [address] Phone: [phone] Fax: [phone]  Has the prescription been filled recently? Yes  Is the patient out of the medication? Yes  Has the patient been seen for an appointment in the last year OR does the patient have an upcoming appointment? Yes  Can we respond through MyChart? Yes  Agent: Please be advised that Rx refills may take up to 3 business days. We ask that you follow-up with your pharmacy.

## 2024-04-24 ENCOUNTER — Ambulatory Visit (INDEPENDENT_AMBULATORY_CARE_PROVIDER_SITE_OTHER): Admitting: Obstetrics

## 2024-04-24 ENCOUNTER — Encounter: Payer: Self-pay | Admitting: Obstetrics

## 2024-04-24 VITALS — BP 137/64 | HR 78 | Ht 64.0 in | Wt 138.0 lb

## 2024-04-24 DIAGNOSIS — L900 Lichen sclerosus et atrophicus: Secondary | ICD-10-CM | POA: Diagnosis not present

## 2024-04-24 DIAGNOSIS — N905 Atrophy of vulva: Secondary | ICD-10-CM | POA: Diagnosis not present

## 2024-04-24 NOTE — Progress Notes (Signed)
    GYNECOLOGY PROGRESS NOTE  Subjective:  PCP: Judithann Novas, MD  Patient ID: Renee [name], female    DOB: 1946/06/06, 78 y.o.   MRN: [id number]  HPI  Patient is a 78 y.o. [id number] female who presents for her annual vulvar check for lichen sclerosis, biopsy confirmed 01/17/23. Uses temovate  several nights per week. Dr Everardo Hitch prescribed vaginal estrogen to help with her VVA, but it was cost prohibitive for her and also caused vaginal burning. She last saw Dr Everardo Hitch on 10/18/23.   The following portions of the patient's history were reviewed and updated as appropriate: allergies, current medications, past family history, past medical history, past social history, past surgical history, and problem list.  Review of Systems Pertinent items are noted in HPI.   Objective:   Blood pressure 137/64, pulse 78, height 5\' 4"  (1.626 m), weight 138 lb (62.6 kg). Body mass index is 23.69 kg/m.  General appearance: alert and cooperative Abdomen: soft, non-tender; bowel sounds normal; no masses,  no organomegaly Pelvic: cervix normal in appearance, no adnexal masses or tenderness, no cervical motion tenderness, rectovaginal septum normal, uterus normal size, shape, and consistency, and vulvovaginal atrophy; demarcated redness with some small white patches Extremities: extremities normal, atraumatic, no cyanosis or edema Neurologic: Grossly normal    Assessment/Plan:   1. Lichen sclerosus et atrophicus   2. Vulvar atrophy    78 y.o. [id number] with biopsy-confirmed lichen sclerosus here for vulvar check, is much improved and stable at this time on Temovate . Pt declines refills through us  today, states she likes her PCP to order it at the same time as her other medications and they are ok with this. She did trial vaginal estrogen and was cost-prohibitive and caused vaginal burning, prefers not to use.  -Continue Temovate  regularly to control flares -Preventative lifestyle modifications reviewed -Recommend  monthly self-exams with mirror -Follow up annually for exam, notify sooner if worsening or changing   Sofia Dunn, DO [facility]

## 2024-04-27 ENCOUNTER — Telehealth: Payer: Self-pay | Admitting: *Deleted

## 2024-04-27 ENCOUNTER — Ambulatory Visit: Payer: Self-pay

## 2024-04-27 DIAGNOSIS — E1159 Type 2 diabetes mellitus with other circulatory complications: Secondary | ICD-10-CM

## 2024-04-27 NOTE — Telephone Encounter (Signed)
 Please help patient re-apply for patient assistance for her Trulicity .

## 2024-04-27 NOTE — Telephone Encounter (Signed)
 This RN spoke to pt. Pt called in stating she is returning a call and that she had been recently speaking to someone about Trulicity  and her pharmacy. Per chart review, RN unable to find information about who the patient was speaking with. RN called the CAL and they confirmed they did not call her.  Pt has additional medications about Trulicity  medication and pt's pharmacy. Pt states she has enough medication for this week and next. RN advised pt RN would route concerns to the clinic.

## 2024-04-27 NOTE — Telephone Encounter (Signed)
 Copied from CRM [id number]. Topic: Clinical - Medication Question >> Apr 27, 2024  3:30 PM Dyann Glaser G wrote: Reason for CRM: THE PATIENT CALLED STATED SHE HAS NOT RECEIVED HER Dulaglutide  (TRULICITY ) 0.75 MG/0.5ML SOAJ. BUT SHE WAS NOT UNDERSTANDING THAT [employer] ENROLLMENT EXPIRED IN 11/2023 FOR HER TREATMENT PLAN.

## 2024-04-30 ENCOUNTER — Telehealth: Payer: Self-pay

## 2024-04-30 NOTE — Telephone Encounter (Signed)
 Called and spoke to patient advised message has been sent to Med Rx team to reapply for Trulicity  patient assistance. Patient has one shot remaining to do on 5/11 and then she will be out. Okay to send short term supply of Trulicity  to local pharmacy for patient?

## 2024-04-30 NOTE — Telephone Encounter (Signed)
 Gave pt a call to follow up on her re-enrollment on [employer] Trulicity ,pt did not answer left a HIPAA VM and will be faxing provider portion to be sign. Will follow up in a few days.

## 2024-05-01 ENCOUNTER — Ambulatory Visit (INDEPENDENT_AMBULATORY_CARE_PROVIDER_SITE_OTHER): Payer: Medicare Other | Admitting: Podiatry

## 2024-05-01 DIAGNOSIS — Q828 Other specified congenital malformations of skin: Secondary | ICD-10-CM

## 2024-05-01 MED ORDER — TRULICITY 0.75 MG/0.5ML ~~LOC~~ SOAJ: 0.7500 mg | SUBCUTANEOUS | 3 refills | Status: DC

## 2024-05-01 MED ORDER — TRULICITY 0.75 MG/0.5ML ~~LOC~~ SOAJ: 0.7500 mg | SUBCUTANEOUS | 0 refills | Status: DC

## 2024-05-01 NOTE — Addendum Note (Signed)
 Addended by: Wyn Heater on: 05/01/2024 03:31 PM   Modules accepted: Orders

## 2024-05-01 NOTE — Telephone Encounter (Signed)
 I have faxed a temporary prescription to [facility] in [place] for patient's Trulicity  until PAP application can be completed.

## 2024-05-01 NOTE — Telephone Encounter (Signed)
 Physician portion placed in Dr. Millie Alm office in box for signature.

## 2024-05-01 NOTE — Progress Notes (Signed)
 Subjective:  Patient ID: Renee [name], female    DOB: 1946/04/13,  MRN: [id number]  Chief Complaint  Patient presents with   Nail Problem    Nail trim     78 y.o. female presents with the above complaint.  Patient presents with complaint of bilateral fifth metatarsal hyperkeratotic lesion.  Patient stated they are still very painful but the debridement does help.  She is in agreement thickened elongated dystrophic toenails x10 mild pain on palpation she would like for them to debride down to.   Review of Systems: Negative except as noted in the HPI. Denies N/V/F/Ch.  Past Medical History:  Diagnosis Date   Allergy    Anxiety    Arthritis    CAD (coronary artery disease)    non-obstructive. cath 11/11   Claustrophobia    Diabetes mellitus    borderline diabetic - diet controlled    GERD (gastroesophageal reflux disease)    History of chicken pox    History of phobia    clastrophobia   Hyperlipidemia    Hypertension    Hypothyroidism    Kidney stones    hx of    Lichen sclerosus    Psoriasis    Stroke (HCC)    Urinary tract infection    hx of     Current Outpatient Medications:    acetaminophen  (TYLENOL ) 650 MG CR tablet, Take 1,300 mg by mouth every 8 (eight) hours as needed for pain., Disp: , Rfl:    aspirin EC 81 MG tablet, Take 81 mg by mouth daily., Disp: , Rfl:    Carboxymethylcellulose Sodium (THERATEARS OP), Place 1 drop into both eyes at bedtime as needed (dry eyes)., Disp: , Rfl:    clobetasol  ointment (TEMOVATE ) 0.05 %, APPLY TOPICALLY DAILY AS NEEDED FOR PSORIASIS FLARES, Disp: 60 g, Rfl: 0   Coenzyme Q10 (COQ10) 200 MG CAPS, Take 200 mg by mouth daily., Disp: , Rfl:    Dulaglutide  (TRULICITY ) 0.75 MG/0.5ML SOAJ, Inject 0.75 mg into the skin once a week., Disp: 2 mL, Rfl: 3   erythromycin  ophthalmic ointment, 3 times daily for 5 to 7 days, Disp: 3.5 g, Rfl: 0   ezetimibe  (ZETIA ) 10 MG tablet, TAKE 1 TABLET BY MOUTH DAILY, Disp: 90 tablet, Rfl: 3    famotidine (PEPCID) 20 MG tablet, Take 20 mg by mouth daily as needed for heartburn or indigestion., Disp: , Rfl:    Flaxseed, Linseed, (FLAX SEED OIL PO), Take 1 capsule by mouth daily., Disp: , Rfl:    fluticasone (FLONASE) 50 MCG/ACT nasal spray, Place 1 spray into both nostrils daily as needed for allergies., Disp: , Rfl:    Inclisiran Sodium  (LEQVIO  Rogers), Inject into the skin every 6 (six) months. Next dose due in May, Disp: , Rfl:    levothyroxine  (SYNTHROID ) 75 MCG tablet, TAKE 1 TABLET BY MOUTH EVERY DAY, Disp: 90 tablet, Rfl: 1   losartan  (COZAAR ) 50 MG tablet, TAKE 1 TABLET BY MOUTH EVERY DAY, Disp: 90 tablet, Rfl: 1   MAGNESIUM PO, Take 1 tablet by mouth daily., Disp: , Rfl:    Multiple Vitamin (MULTIVITAMIN) tablet, Take 1 tablet by mouth daily., Disp: , Rfl:    Skin Protectants, Misc. (EUCERIN) cream, Apply 1 application  topically as needed (eczema)., Disp: , Rfl:    triamcinolone  cream (KENALOG ) 0.1 %, Apply 1 application topically 2 (two) times daily as needed (psoriasis)., Disp: 454 g, Rfl: 0   Vitamin D , Cholecalciferol, 25 MCG (1000 UT) CAPS, Take 2,000 Units  by mouth daily., Disp: , Rfl:   Social History   Tobacco Use  Smoking Status Never  Smokeless Tobacco Never    Allergies  Allergen Reactions   Celexa  [Citalopram ] Shortness Of Breath and Swelling    Throat swelling and difficulty breathing   Other Rash    Metal    Codeine Nausea And Vomiting and Other (See Comments)   Metformin Nausea And Vomiting   Robaxin  [Methocarbamol ] Nausea Only   Tizanidine  Other (See Comments)    Oversedated   Crestor  [Rosuvastatin ] Rash   Nickel Rash   Simvastatin Swelling and Rash   Objective:   There were no vitals filed for this visit.  There is no height or weight on file to calculate BMI. Constitutional Well developed. Well nourished.  Vascular Dorsalis pedis pulses palpable bilaterally. Posterior tibial pulses palpable bilaterally. Capillary refill normal to all digits.   No cyanosis or clubbing noted. Pedal hair growth normal.  Neurologic Normal speech. Oriented to person, place, and time. Epicritic sensation to light touch grossly present bilaterally.  Dermatologic Nail Exam: Pt has thick disfigured discolored nails with subungual debris noted bilateral entire nail hallux through fifth toenails.  Pain on palpation to the nails. No open wounds. No skin lesions.  Orthopedic: Bilateral submetatarsal 5 porokeratotic/benign skin lesion with central nucleated core noted.  Upon debridement no pinpoint bleeding noted.  No wounds noted.   Radiographs:   3 views of skeletally mature adult bilateral foot: Midfoot arthritis noted.  Plantar and posterior heel spurring noted.  Plantarflexed fifth metatarsal noted with mild signs of tailor's bunion.  There is increase in lateral deviation angle. Assessment:   No diagnosis found.       Plan:  Patient was evaluated and treated and all questions answered.  Bilateral submetatarsal 5 porokeratosis x2 -X-ray to the patient the etiology of porokeratosis and was treatment options were extensively discussed.  Given the amount of pain she is having I believe she will benefit from debridement of the lesion followed by excision of the lesion.  I discussed this with the patient she states understanding like to proceed with that. -Using chisel blade and handle the lesion was debrided down to healthy striated tissue followed by excision of central nucleated core.  Immediate pain was noted after debridement and excision.  No pinpoint bleeding noted.  Onychomycosis with pain  -Nails palliatively debrided as below. -Educated on self-care  Procedure: Nail Debridement Rationale: pain  Type of Debridement: manual, sharp debridement. Instrumentation: Nail nipper, rotary burr. Number of Nails: 10  Procedures and Treatment: Consent by patient was obtained for treatment procedures. The patient understood the discussion of treatment  and procedures well. All questions were answered thoroughly reviewed. Debridement of mycotic and hypertrophic toenails, 1 through 5 bilateral and clearing of subungual debris. No ulceration, no infection noted.  Return Visit-Office Procedure: Patient instructed to return to the office for a follow up visit 3 months for continued evaluation and treatment.  Tinnie Forehand, DPM    No follow-ups on file.     No follow-ups on file.

## 2024-05-02 ENCOUNTER — Other Ambulatory Visit (HOSPITAL_COMMUNITY): Payer: Self-pay

## 2024-05-02 NOTE — Telephone Encounter (Signed)
 PAP application faxed to PAP Team at [phone].

## 2024-05-02 NOTE — Telephone Encounter (Signed)
 Completed physician Section placed up front in Lindsay's folder.

## 2024-05-02 NOTE — Telephone Encounter (Signed)
 Received provider portion PAP [employer] Trulicity  today, gave pt a call to see if they have received pt portion of application,spoke with pt daughter explain they have not received it but,once they do they will either bring it to provider office or mail it back daughter will let me know.

## 2024-05-08 NOTE — Telephone Encounter (Signed)
 Received a call from pt daughter asking if she needed to submitted proof of income and if she can do her taxes or ss paper work ,pt can do either or.pt is also hesitance about mailing back the application she ask if she can bring it to provider office and have them scan her paper work pt  will be bring in tomorrow 05/09/24,pt will give me a call back after dropping off the paperwork.

## 2024-05-09 ENCOUNTER — Encounter: Payer: Self-pay | Admitting: Neurology

## 2024-05-09 ENCOUNTER — Other Ambulatory Visit: Payer: Self-pay | Admitting: Family Medicine

## 2024-05-09 ENCOUNTER — Telehealth: Payer: Self-pay | Admitting: Family Medicine

## 2024-05-09 ENCOUNTER — Ambulatory Visit: Payer: Medicare Other

## 2024-05-09 VITALS — BP 160/93 | HR 78 | Temp 97.7°F | Resp 16 | Ht 64.0 in | Wt 142.0 lb

## 2024-05-09 DIAGNOSIS — E785 Hyperlipidemia, unspecified: Secondary | ICD-10-CM | POA: Diagnosis not present

## 2024-05-09 DIAGNOSIS — E1169 Type 2 diabetes mellitus with other specified complication: Secondary | ICD-10-CM

## 2024-05-09 DIAGNOSIS — E039 Hypothyroidism, unspecified: Secondary | ICD-10-CM

## 2024-05-09 DIAGNOSIS — E1159 Type 2 diabetes mellitus with other circulatory complications: Secondary | ICD-10-CM

## 2024-05-09 MED ORDER — INCLISIRAN SODIUM 284 MG/1.5ML ~~LOC~~ SOSY
284.0000 mg | PREFILLED_SYRINGE | Freq: Once | SUBCUTANEOUS | Status: AC
  Administered 2024-05-09: 284 mg via SUBCUTANEOUS
  Filled 2024-05-09: qty 1.5

## 2024-05-09 NOTE — Telephone Encounter (Signed)
 Last office visit 12/13/2023 for CPE.  Last refilled 09/27/2023 for 60 g with no refills.  Next appt: 06/20/24 for DM.

## 2024-05-09 NOTE — Progress Notes (Signed)
 Diagnosis: Hyperlipidemia  Provider:  Chilton Greathouse MD  Procedure: Injection  Leqvio (inclisiran), Dose: 284 mg, Site: subcutaneous, Number of injections: 1  Injection Site(s): Right arm  Post Care:  right arm injection  Discharge: Condition: Good, Destination: Home . AVS Provided  Performed by:  Rico Ala, LPN

## 2024-05-09 NOTE — Telephone Encounter (Signed)
 Pt has an appt on 6/25 here at this office and Dr Niels Barry would like a repeat cholesterol level lab order to be put in chart to get drawn at the appt day. Please advise.

## 2024-05-10 NOTE — Telephone Encounter (Signed)
 [name] notified by telephone that Dr. Consuella Denis office ask that we recheck her Lipid at her 06/20/24 appointment with Dr. Cherlyn Cornet so I ask that she come in fasting to that appointment so we can do these labs.  Patient states understanding.

## 2024-05-15 NOTE — Telephone Encounter (Signed)
 Received pt portion of application [employer] Trulicity  and fax it to company today along provider portion. Will follow up in a few days on status of application.

## 2024-05-16 NOTE — Telephone Encounter (Signed)
 Pt has been APPROVED for [employer] Trulicity  until the end of the calendar year 2025.approval letter has been index.left a HIPAA VM at pt number.

## 2024-05-25 ENCOUNTER — Encounter: Payer: Self-pay | Admitting: Neurology

## 2024-05-25 ENCOUNTER — Other Ambulatory Visit (HOSPITAL_COMMUNITY): Payer: Self-pay

## 2024-05-25 ENCOUNTER — Telehealth: Payer: Self-pay

## 2024-05-25 NOTE — Telephone Encounter (Signed)
 Noted

## 2024-05-25 NOTE — Telephone Encounter (Signed)
 Pharmacy Patient Advocate Encounter   Received notification from Onbase that prior authorization for Trulicity  0.75MG /0.5ML  is required/requested.   Insurance verification completed.   The patient is insured through [facility] MEDICARE .   Per test claim: The current 28 day co-pay is, $636.47.  No PA needed at this time. This test claim was processed through [facility]- copay amounts may vary at other pharmacies due to pharmacy/plan contracts, or as the patient moves through the different stages of their insurance plan.     -Patient needs to meet deductible.

## 2024-06-12 ENCOUNTER — Ambulatory Visit: Payer: Medicare Other | Admitting: Family Medicine

## 2024-06-17 ENCOUNTER — Other Ambulatory Visit: Payer: Self-pay | Admitting: Family Medicine

## 2024-06-18 NOTE — Addendum Note (Signed)
 Addended by: WENDELL ARLAND RAMAN on: 06/18/2024 11:19 AM   Modules accepted: Orders

## 2024-06-20 ENCOUNTER — Ambulatory Visit: Admitting: Family Medicine

## 2024-06-20 VITALS — BP 136/74 | HR 96 | Temp 97.8°F | Ht 62.5 in | Wt 135.4 lb

## 2024-06-20 DIAGNOSIS — I152 Hypertension secondary to endocrine disorders: Secondary | ICD-10-CM | POA: Diagnosis not present

## 2024-06-20 DIAGNOSIS — E119 Type 2 diabetes mellitus without complications: Secondary | ICD-10-CM

## 2024-06-20 DIAGNOSIS — E039 Hypothyroidism, unspecified: Secondary | ICD-10-CM | POA: Diagnosis not present

## 2024-06-20 DIAGNOSIS — Z7985 Long-term (current) use of injectable non-insulin antidiabetic drugs: Secondary | ICD-10-CM

## 2024-06-20 DIAGNOSIS — E1159 Type 2 diabetes mellitus with other circulatory complications: Secondary | ICD-10-CM | POA: Diagnosis not present

## 2024-06-20 DIAGNOSIS — E1169 Type 2 diabetes mellitus with other specified complication: Secondary | ICD-10-CM

## 2024-06-20 DIAGNOSIS — E785 Hyperlipidemia, unspecified: Secondary | ICD-10-CM | POA: Diagnosis not present

## 2024-06-20 LAB — POCT GLYCOSYLATED HEMOGLOBIN (HGB A1C): Hemoglobin A1C: 5.9 % — AB (ref 4.0–5.6)

## 2024-06-20 LAB — TSH: TSH: 1.13 u[IU]/mL (ref 0.35–5.50)

## 2024-06-20 LAB — COMPREHENSIVE METABOLIC PANEL WITH GFR
ALT: 13 U/L (ref 0–35)
AST: 20 U/L (ref 0–37)
Albumin: 4.3 g/dL (ref 3.5–5.2)
Alkaline Phosphatase: 58 U/L (ref 39–117)
BUN: 21 mg/dL (ref 6–23)
CO2: 28 meq/L (ref 19–32)
Calcium: 9.7 mg/dL (ref 8.4–10.5)
Chloride: 105 meq/L (ref 96–112)
Creatinine, Ser: 0.7 mg/dL (ref 0.40–1.20)
GFR: 82.04 mL/min (ref >60.00)
Glucose, Bld: 98 mg/dL (ref 70–99)
Potassium: 4.5 meq/L (ref 3.5–5.1)
Sodium: 141 meq/L (ref 135–145)
Total Bilirubin: 0.4 mg/dL (ref 0.2–1.2)
Total Protein: 7 g/dL (ref 6.0–8.3)

## 2024-06-20 LAB — LIPID PANEL
Cholesterol: 107 mg/dL (ref 0–200)
HDL: 45.7 mg/dL (ref >39.00)
LDL Cholesterol: 43 mg/dL (ref 0–99)
NonHDL: 61.51
Total CHOL/HDL Ratio: 2
Triglycerides: 95 mg/dL (ref 0.0–149.0)
VLDL: 19 mg/dL (ref 0.0–40.0)

## 2024-06-20 LAB — T3, FREE: T3, Free: 3.2 pg/mL (ref 2.3–4.2)

## 2024-06-20 LAB — T4, FREE: Free T4: 1.01 ng/dL (ref 0.60–1.60)

## 2024-06-20 NOTE — Assessment & Plan Note (Signed)
Chronic, associated with hypertension Well-controlled on Trulicity 1.94 mg weekly.  No side effects

## 2024-06-20 NOTE — Assessment & Plan Note (Addendum)
 Due for re-eval q 6 months.  Chronic, improved control LDL goal < 70 given CAD.   She continues Zetia  10 mg p.o. daily.   On Leqvio .

## 2024-06-20 NOTE — Progress Notes (Signed)
 Patient ID: Renee [name], female    DOB: [birth year], 78 y.o.   MRN: [id number]  This visit was conducted in person.  BP 136/74   Pulse 96   Temp 97.8 F (36.6 C) (Oral)   Ht 5' 2.5 (1.588 m)   Wt 135 lb 6.4 oz (61.4 kg)   SpO2 (!) 77%   BMI 24.37 kg/m    CC:  Chief Complaint  Patient presents with   Diabetes    6 month f/u   Hyperlipidemia    Neurologist is requesting that her cholesterol checked since she is on injection. Last injection done in May      Subjective:   HPI: Renee [name] is a 78 y.o. female presenting on 06/20/2024 for Diabetes (6 month f/u) and Hyperlipidemia (Neurologist is requesting that her cholesterol checked since she is on injection. Last injection done in May  )   Hypertension:  Improved control on current regimen. BP Readings from Last 3 Encounters:  06/20/24 136/74  05/09/24 (!) 160/93  04/24/24 137/64  Using medication without problems or lightheadedness:  Chest pain with exertion:none Edema:none Short of breath:none Average home BPs: 139-140/70-80  Per last note Other issues:  Elevated Cholesterol:  Due for re-eval. Chronic, significant improvement in control o lequvio. She continues Zetia  10 mg p.o. daily.  She is intolerant of statins given myopathy. Lab Results  Component Value Date   CHOL 114 12/06/2023   HDL 49.30 12/06/2023   LDLCALC 41 12/06/2023   LDLDIRECT 121.0 06/01/2023   TRIG 119.0 12/06/2023   CHOLHDL 2 12/06/2023  Using medications without problems: Muscle aches:  Diet compliance: Exercise: PT for  right shoulder post surgery... reviewed recent ORtho OV. Other complaints:  Diabetes:  well controlled on Trulicity  0.75 mg weekly  Lab Results  Component Value Date   HGBA1C 5.9 (A) 06/20/2024  Using medications without difficulties: Hypoglycemic episodes: none Hyperglycemic episodes: none Feet problems: no ulcer Blood Sugars averaging:  102-138 eye exam within last year: yes  Wt Readings from Last 3  Encounters:  06/20/24 135 lb 6.4 oz (61.4 kg)  05/09/24 142 lb (64.4 kg)  04/24/24 138 lb (62.6 kg)    Hypothyroid stable control last check on levothyroxine  75 mcg daily  Due for re-eval. Lab Results  Component Value Date   TSH 1.65 07/05/2023   Wt Readings from Last 3 Encounters:  06/20/24 135 lb 6.4 oz (61.4 kg)  05/09/24 142 lb (64.4 kg)  04/24/24 138 lb (62.6 kg)       Relevant past medical, surgical, family and social history reviewed and updated as indicated. Interim medical history since our last visit reviewed. Allergies and medications reviewed and updated. Outpatient Medications Prior to Visit  Medication Sig Dispense Refill   acetaminophen  (TYLENOL ) 650 MG CR tablet Take 1,300 mg by mouth every 8 (eight) hours as needed for pain.     aspirin EC 81 MG tablet Take 81 mg by mouth daily.     Carboxymethylcellulose Sodium (THERATEARS OP) Place 1 drop into both eyes at bedtime as needed (dry eyes).     clobetasol  ointment (TEMOVATE ) 0.05 % APPLY TOPICALLY ONCE DAILY AS NEEDED FOR PSORIASIS FLARES 60 g 0   Dulaglutide  (TRULICITY ) 0.75 MG/0.5ML SOAJ Inject 0.75 mg into the skin once a week. 2 mL 3   ezetimibe  (ZETIA ) 10 MG tablet TAKE 1 TABLET BY MOUTH DAILY 90 tablet 3   famotidine (PEPCID) 20 MG tablet Take 20 mg by mouth daily as needed for  heartburn or indigestion.     Flaxseed, Linseed, (FLAX SEED OIL PO) Take 1 capsule by mouth daily.     fluticasone (FLONASE) 50 MCG/ACT nasal spray Place 1 spray into both nostrils daily as needed for allergies.     Inclisiran Sodium  (LEQVIO  Peck) Inject into the skin every 6 (six) months. Next dose due in May     levothyroxine  (SYNTHROID ) 75 MCG tablet TAKE 1 TABLET BY MOUTH EVERY DAY 90 tablet 0   losartan  (COZAAR ) 50 MG tablet TAKE 1 TABLET BY MOUTH EVERY DAY 90 tablet 1   MAGNESIUM PO Take 1 tablet by mouth daily.     Multiple Vitamin (MULTIVITAMIN) tablet Take 1 tablet by mouth daily.     Skin Protectants, Misc. (EUCERIN) cream  Apply 1 application  topically as needed (eczema).     triamcinolone  cream (KENALOG ) 0.1 % Apply 1 application topically 2 (two) times daily as needed (psoriasis). 454 g 0   Vitamin D , Cholecalciferol, 25 MCG (1000 UT) CAPS Take 2,000 Units by mouth daily.     Coenzyme Q10 (COQ10) 200 MG CAPS Take 200 mg by mouth daily. (Patient not taking: Reported on 06/20/2024)     erythromycin  ophthalmic ointment 3 times daily for 5 to 7 days (Patient not taking: Reported on 06/20/2024) 3.5 g 0   No facility-administered medications prior to visit.     Per HPI unless specifically indicated in ROS section below Review of Systems  Constitutional:  Negative for fatigue and fever.  HENT:  Negative for congestion.   Eyes:  Negative for pain.  Respiratory:  Negative for cough and shortness of breath.   Cardiovascular:  Negative for chest pain, palpitations and leg swelling.  Gastrointestinal:  Negative for abdominal pain.  Genitourinary:  Negative for dysuria and vaginal bleeding.  Musculoskeletal:  Negative for back pain.  Neurological:  Negative for syncope, light-headedness and headaches.  Psychiatric/Behavioral:  Negative for dysphoric mood.    Objective:  BP 136/74   Pulse 96   Temp 97.8 F (36.6 C) (Oral)   Ht 5' 2.5 (1.588 m)   Wt 135 lb 6.4 oz (61.4 kg)   SpO2 (!) 77%   BMI 24.37 kg/m   Wt Readings from Last 3 Encounters:  06/20/24 135 lb 6.4 oz (61.4 kg)  05/09/24 142 lb (64.4 kg)  04/24/24 138 lb (62.6 kg)      Physical Exam Constitutional:      General: She is not in acute distress.    Appearance: Normal appearance. She is well-developed. She is not ill-appearing or toxic-appearing.  HENT:     Head: Normocephalic.     Right Ear: Hearing, tympanic membrane, ear canal and external ear normal. Tympanic membrane is not erythematous, retracted or bulging.     Left Ear: Hearing, tympanic membrane, ear canal and external ear normal. Tympanic membrane is not erythematous, retracted or  bulging.     Nose: No mucosal edema or rhinorrhea.     Right Sinus: No maxillary sinus tenderness or frontal sinus tenderness.     Left Sinus: No maxillary sinus tenderness or frontal sinus tenderness.     Mouth/Throat:     Pharynx: Uvula midline.   Eyes:     General: Lids are normal. Lids are everted, no foreign bodies appreciated.     Conjunctiva/sclera: Conjunctivae normal.     Pupils: Pupils are equal, round, and reactive to light.   Neck:     Thyroid : No thyroid  mass or thyromegaly.     Vascular: No  carotid bruit.     Trachea: Trachea normal.   Cardiovascular:     Rate and Rhythm: Normal rate and regular rhythm.     Pulses: Normal pulses.     Heart sounds: Normal heart sounds, S1 normal and S2 normal. No murmur heard.    No friction rub. No gallop.  Pulmonary:     Effort: Pulmonary effort is normal. No tachypnea or respiratory distress.     Breath sounds: Normal breath sounds. No decreased breath sounds, wheezing, rhonchi or rales.  Abdominal:     General: Bowel sounds are normal.     Palpations: Abdomen is soft.     Tenderness: There is no abdominal tenderness.   Musculoskeletal:     Cervical back: Normal range of motion and neck supple.   Skin:    General: Skin is warm and dry.     Findings: No rash.   Neurological:     Mental Status: She is alert.   Psychiatric:        Mood and Affect: Mood is not anxious or depressed.        Speech: Speech normal.        Behavior: Behavior normal. Behavior is cooperative.        Thought Content: Thought content normal.        Judgment: Judgment normal.       Results for orders placed or performed in visit on 06/20/24  POCT glycosylated hemoglobin (Hb A1C)   Collection Time: 06/20/24 10:58 AM  Result Value Ref Range   Hemoglobin A1C 5.9 (A) 4.0 - 5.6 %   HbA1c POC (<> result, manual entry)     HbA1c, POC (prediabetic range)     HbA1c, POC (controlled diabetic range)       COVID 19 screen:  No recent travel or known  exposure to COVID19 The patient denies respiratory symptoms of COVID 19 at this time. The importance of social distancing was discussed today.   Assessment and Plan The patient's preventative maintenance and recommended screening tests for an annual wellness exam were reviewed in full today. Brought up to date unless services declined.  Counselled on the importance of diet, exercise, and its role in overall health and mortality. The patient's FH and SH was reviewed, including their home life, tobacco status, and drug and alcohol status.    Vaccines:  Flu , PNA and COVID x 3 uptodate . Shingles, TD - considering Foot exam - DONE  Colon cancer screening - colonoscopy 04/2017, plan repeat in 5 years per pt request.   Mammo: 12/2022 e Bone density - 12/2021 Hep C screening - done  Nonsmoker  Problem List Items Addressed This Visit     Adult hypothyroidism    Due for re-eval.      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  No side effects         Hyperlipidemia associated with type 2 diabetes mellitus (HCC)   Due for re-eval .      Hypertension associated with diabetes (HCC)   Stable, chronic.  Continue current medication.  Losartan  50 mg p.o. daily      Other Visit Diagnoses       Diabetes mellitus without complication (HCC)       Relevant Orders   POCT glycosylated hemoglobin (Hb A1C) (Completed)          Greig Ring, MD

## 2024-06-20 NOTE — Assessment & Plan Note (Signed)
Stable, chronic.  Continue current medication.  Losartan 50 mg p.o. daily

## 2024-06-20 NOTE — Assessment & Plan Note (Signed)
 Due for re-eval.

## 2024-06-21 ENCOUNTER — Ambulatory Visit: Payer: Self-pay | Admitting: Family Medicine

## 2024-06-25 DIAGNOSIS — E119 Type 2 diabetes mellitus without complications: Secondary | ICD-10-CM | POA: Diagnosis not present

## 2024-06-25 DIAGNOSIS — Z961 Presence of intraocular lens: Secondary | ICD-10-CM | POA: Diagnosis not present

## 2024-06-25 DIAGNOSIS — M3501 Sicca syndrome with keratoconjunctivitis: Secondary | ICD-10-CM | POA: Diagnosis not present

## 2024-06-25 DIAGNOSIS — Z0100 Encounter for examination of eyes and vision without abnormal findings: Secondary | ICD-10-CM | POA: Diagnosis not present

## 2024-06-25 LAB — HM DIABETES EYE EXAM

## 2024-07-16 NOTE — Progress Notes (Signed)
 [facility] [address] [place]. [place] [address] [phone]       OFFICE FOLLOW UP VISIT NOTE  [name] Date of Birth:  08/10/1946 Medical Record Number:  [id number]    Primary neurologist: Dr. Rosemarie Reason for visit:  TIA and MCI   Chief complaint:  Chief Complaint  Patient presents with   Transient Ischemic Attack    RM 8 with daughter  Pt is well and stable, reports no new stroke or memory concerns.       HPI:  Update 07/17/2024 JM: Patient returns for 80-month follow-up visit accompanied by her daughter.  Overall stable from stroke standpoint without new stroke/TIA symptoms.  Reports cognition overall stable, continues to maintain ADLs and IADLs independently as well as driving.  She tries to remain very active.  She reports worsening memory if feeling rushed or stressed but eventually will remember.  Reports compliance on aspirin and Leqvio , last injection in May. Lipid panel 05/2024 LDL 43.  Routinely monitors blood pressure at home and typically 120-130s/70s. She usually checks BP on left wrist, if she gets a high number at home, she will check the right arm and usually will be lower. Routinely follows with PCP for stroke risk factor management.     History provided for reference purposes only Update 01/09/2024 JM: Patient returns for stroke follow-up visit accompanied by her daughter.  Prior visit with Dr. Rosemarie 6 months ago.  At prior visit, she was started on Leqvio  injection in addition to Zetia  for HLD management, initial injection was on 8/9 and repeat injection on 11/11. Next injection scheduled 04/2024. Tolerating injection well. Most recent lipid panel 11/2023 with LDL 41, prior to starting Leqvio  LDL 121.  Also remains on aspirin 81 mg daily. Blood pressure today elevated which is typical when she goes to doctors appointments, routinely monitored at home and typically 120-130s/70s. Routinely follows with PCP for stroke risk factor  management.  No new stroke/TIA symptoms. Reports cognition has been stable. Still lives alone, maintains ADL and IADLs independently, continues to drive without difficulty.  No further questions or concerns at this time.  Update 06/20/2023 Dr. Rosemarie: She returns for follow-up after last visit 6 months ago with Renee, nurse practitioner.  Patient is doing well.  She is still living alone and is independent.  She drives and pays her bills.  He denies any recurrent TIA or stroke symptoms.  She remains on aspirin which is tolerating well with minor bruising but no bleeding.  Blood pressure is under good control.  Remains on Zetia  for her lipids as she has statin intolerance.  Last lipid profile on 06/01/2023 showed LDL cholesterol to be suboptimally controlled at 121 mg percent.  She has not yet tried Repatha or Praluent injections or Leqvio .  She has no new complaints today.  Mini-Mental status score today 24/30 which is stable from last visit   Update 11/29/2022 JM: Reports overall stable since prior visit accompanied by her daughters (one in office, one via video).  Denies any new or reoccurring stroke/TIA symptoms since prior visit  Reports cognition has been about the same since prior visit. Lives alone, able to maintain ADLs and IADLs independently. Continues to drive, no issues with this. Appears to do well with compensation strategies. Remains on aspirin and Zetia .  Blood pressure well controlled, elevated today but has had a busy day with appointments, occasionally monitors at home and typically stable.   Update 03/16/2022 Dr. Rosemarie; she returns for  follow-up after last visit 6 months ago.  She is accompanied by her daughter.  Is doing well.  She continues to live alone and is independent in actives of daily living.  She is still driving.  She does not finances except online billing which her daughter handles.  She continues to have short-term memory difficulties but these are nonprogressive and unchanged.   She does write things down and uses a calendar.  She does not do many activities like solving puzzles or sudoku as she does not like it.  She has not had any stroke or TIA symptoms.  She remains on aspirin she is tolerating well without bruising or bleeding.  Her blood pressure is under good control today it is 138/70.  She states her sugars are doing pretty good.  She saw her primary care physician and all lab work is satisfactory.  On Mini-Mental status testing by my assistant today she scored 21/30 but probably would not have a good time is on my testing she scored recall 2/3 and able to name 14 animals will can walk on 4 legs and clock drawing she scored normal 4/4  Update 09/16/2021 Dr. Rosemarie: She returns for follow-up after last visit 2 and half months ago.  She is accompanied by her daughter Renee [name].  Patient continues to have memory difficulties mostly for short-term.  She still lives independently and manages most of her activities of daily living though her daughters live nearby and provide support.  She has had no further episodes of TIA or blurred vision no complicated migraine episodes.  Main complaint today is chronic right shoulder pain which limits her range of motion.  She has gotten some injection of steroids for chronic pain that has not helped.  She is currently doing some home therapy.  She had recent lab work on 08/24/2021 which showed borderline hemoglobin A1c of 6.6 and LDL cholesterol of 84 mg percent.  She remains on Zetia  and previously has been intolerant to Crestor  and Zocor.  She had CT angiogram of the brain and neck done on 07/17/2021 which showed no significant large vessel intracranial or extracranial stenosis.  There was 1 to 2 mm paraclinoid left ICA infundibulum versus tiny aneurysm noted.  Temporal artery ultrasound on 07/16/2021 was negative for halo sign.   Initial visit 06/25/2021 Dr. Darra. [name] is a pleasant 78 year old Caucasian lady who is referred today for initial  office consultation visit for TIA.  History is obtained from the patient, her daughter as well as review of electronic medical records and I personally reviewed pertinent imaging films in PACS.  She has a past medical history of coronary artery disease, diabetes, hypertension, hyperlipidemia, hypothyroidism and arthritis.  She states she had episode of sudden onset of blurred vision and dark vision when she woke up from sleep on 06/17/2021.  She was able to see but vision was not clear.  She checked her blood sugar which was fine.  She also noticed that she has some trouble thinking straight and trouble speaking.  She went to a restaurant where she had trouble ordering her food eventually she was able to place an order.  She subsequently had a meal and sat in a car and while driving to the gynecologist office noticed numbness in the right hand from the wrist down.  She also felt that her right cheek of her cheek was being pulled to the side.  When she continues with annual gynecology she advised her to go to the ER.  Her symptoms are completely resolved by the time she reached there.  Patient denied any accompanying headache, double vision, vertigo, gait or balance problems.  Patient said she had a somewhat similar episode a couple of years ago when she also had some difficulty with vision, focusing as well as trouble thinking and word finding which also did not stay long.  She did not have a headache after that.  She did not seek medical help at that time.  Patient does have a history of remote migraines but she states she is outgrown them and has not had one for years.  2 of her daughters also have migraines.  She denies any other prior history of strokes or TIAs or seizures.  She had a CT scan of the head on 06/17/2021 which showed possibly a right basal ganglia lacunar however an MRI scan done the next day showed no evidence of acute stroke or old strokes and only showed age-related atrophy and white matter  changes.  Lab work on 04/27/2021 showed hemoglobin A1c of 7.3 and LDL cholesterol 140 mg percent.  Patient also has complaints of headache with posterior neck pain with tenderness in the left occipital groove.  She also complains of muscle aches and pains and in fact has an appointment soon to see rheumatologist Dr. Dolphus for these complaints..      ROS:   14 system review of systems is positive for those listed in HPI and neck pain and all other systems negative  PMH:  Past Medical History:  Diagnosis Date   Allergy    Anxiety    Arthritis    CAD (coronary artery disease)    non-obstructive. cath 11/11   Claustrophobia    Diabetes mellitus    borderline diabetic - diet controlled    GERD (gastroesophageal reflux disease)    History of chicken pox    History of phobia    clastrophobia   Hyperlipidemia    Hypertension    Hypothyroidism    Kidney stones    hx of    Lichen sclerosus    Psoriasis    Stroke (HCC)    Urinary tract infection    hx of     Social History:  Social History   Socioeconomic History   Marital status: Divorced    Spouse name: Not on file   Number of children: 6   Years of education: Not on file   Highest education level: Not on file  Occupational History   Occupation: Retired  Tobacco Use   Smoking status: Never   Smokeless tobacco: Never  Vaping Use   Vaping status: Never Used  Substance and Sexual Activity   Alcohol use: Yes    Comment: rare   Drug use: No   Sexual activity: Not Currently    Birth control/protection: Post-menopausal  Other Topics Concern   Not on file  Social History Narrative   Lives alone   Right Handed   Drink 1 cups caffeine daily   Social Drivers of Health   Financial Resource Strain: Low Risk  (12/15/2023)   Overall Financial Resource Strain (CARDIA)    Difficulty of Paying Living Expenses: Not very hard  Food Insecurity: No Food Insecurity (12/15/2023)   Hunger Vital Sign    Worried About Running  Out of Food in the Last Year: Never true    Ran Out of Food in the Last Year: Never true  Transportation Needs: No Transportation Needs (12/15/2023)   PRAPARE - Transportation  Lack of Transportation (Medical): No    Lack of Transportation (Non-Medical): No  Physical Activity: Insufficiently Active (12/15/2023)   Exercise Vital Sign    Days of Exercise per Week: 3 days    Minutes of Exercise per Session: 30 min  Stress: No Stress Concern Present (12/15/2023)   [employer] of Occupational Health - Occupational Stress Questionnaire    Feeling of Stress : Not at all  Social Connections: Moderately Isolated (12/15/2023)   Social Connection and Isolation Panel    Frequency of Communication with Friends and Family: More than three times a week    Frequency of Social Gatherings with Friends and Family: More than three times a week    Attends Religious Services: 1 to 4 times per year    Active Member of [employer] or Organizations: No    Attends Banker Meetings: Never    Marital Status: Divorced  Catering manager Violence: Not At Risk (12/15/2023)   Humiliation, Afraid, Rape, and Kick questionnaire    Fear of Current or Ex-Partner: No    Emotionally Abused: No    Physically Abused: No    Sexually Abused: No    Medications:   Current Outpatient Medications on File Prior to Visit  Medication Sig Dispense Refill   acetaminophen  (TYLENOL ) 650 MG CR tablet Take 1,300 mg by mouth every 8 (eight) hours as needed for pain.     aspirin EC 81 MG tablet Take 81 mg by mouth daily.     Carboxymethylcellulose Sodium (THERATEARS OP) Place 1 drop into both eyes at bedtime as needed (dry eyes).     clobetasol  ointment (TEMOVATE ) 0.05 % APPLY TOPICALLY ONCE DAILY AS NEEDED FOR PSORIASIS FLARES 60 g 0   Dulaglutide  (TRULICITY ) 0.75 MG/0.5ML SOAJ Inject 0.75 mg into the skin once a week. 2 mL 3   famotidine (PEPCID) 20 MG tablet Take 20 mg by mouth daily as needed for heartburn or  indigestion.     Flaxseed, Linseed, (FLAX SEED OIL PO) Take 1 capsule by mouth daily.     fluticasone (FLONASE) 50 MCG/ACT nasal spray Place 1 spray into both nostrils daily as needed for allergies.     Inclisiran Sodium  (LEQVIO  [place]) Inject into the skin every 6 (six) months. Next dose due in May     levothyroxine  (SYNTHROID ) 75 MCG tablet TAKE 1 TABLET BY MOUTH EVERY DAY 90 tablet 0   losartan  (COZAAR ) 50 MG tablet TAKE 1 TABLET BY MOUTH EVERY DAY 90 tablet 1   MAGNESIUM PO Take 1 tablet by mouth daily.     Multiple Vitamin (MULTIVITAMIN) tablet Take 1 tablet by mouth daily.     Skin Protectants, Misc. (EUCERIN) cream Apply 1 application  topically as needed (eczema).     triamcinolone  cream (KENALOG ) 0.1 % Apply 1 application topically 2 (two) times daily as needed (psoriasis). 454 g 0   Vitamin D , Cholecalciferol, 25 MCG (1000 UT) CAPS Take 2,000 Units by mouth daily.     No current facility-administered medications on file prior to visit.    Allergies:   Allergies  Allergen Reactions   Celexa  [Citalopram ] Shortness Of Breath and Swelling    Throat swelling and difficulty breathing   Other Rash    Metal    Codeine Nausea And Vomiting and Other (See Comments)   Metformin Nausea And Vomiting   Robaxin  [Methocarbamol ] Nausea Only   Tizanidine  Other (See Comments)    Oversedated   Crestor  [Rosuvastatin ] Rash   Nickel Rash   Simvastatin  Swelling and Rash       Physical Exam Today's Vitals   07/17/24 0115 07/17/24 0116 07/17/24 1306  BP: (!) 158/78 138/64 (!) 157/79  Pulse:   75  Weight:   134 lb (60.8 kg)  Height:   5' 3 (1.6 m)   General: well developed, well nourished very pleasant elderly Caucasian lady, seated, in no evident distress Head: head normocephalic and atraumatic.   Neck: supple with no carotid or supraclavicular bruits Cardiovascular: regular rate and rhythm, no murmurs Musculoskeletal: no deformity  Skin:  no rash/petichiae Vascular:  2+ RUE, 3+  LUE  Neurologic Exam Mental Status: Awake and fully alert. Oriented to place and time. Recent memory impaired and remote memory intact. Attention span, concentration and fund of knowledge appropriate. Mood and affect appropriate.  Cranial Nerves: Pupils equal, briskly reactive to light. Extraocular movements full without nystagmus. Visual fields full to confrontation. Hearing intact. Facial sensation intact. Face, tongue, palate moves normally and symmetrically.  Motor: Normal bulk and tone. Normal strength in all tested extremity muscles. Sensory.: intact to touch , pinprick , position and vibratory sensation.  Coordination: Rapid alternating movements normal in all extremities. Finger-to-nose and heel-to-shin performed accurately bilaterally. Gait and Station: Arises from chair without difficulty. Stance is normal. Gait demonstrates normal stride length and balance without use of AD Reflexes: 1+ and symmetric. Toes downgoing.       07/17/2024    1:08 PM 06/20/2023    2:51 PM 11/29/2022    3:41 PM  MMSE - Mini Mental State Exam  Orientation to time 5 5 4   Orientation to time comments   said winter instead fall  Orientation to Place 4 4 5   Registration 3 3 3   Attention/ Calculation 1 1 3   Recall 3 3 3   Language- name 2 objects 2 2 2   Language- repeat 1 1 0  Language- follow 3 step command 3 3 3   Language- read & follow direction 1 1 1   Write a sentence 1 1 1   Copy design 1 0 0  Total score 25 24 25          ASSESSMENT/PLAN: 78 year old pleasant Caucasian lady with transient episode of vision and cognitive difficulties, numbness possibly posterior circulation TIAs versus atypical migraine.  Also complains of memory difficulties due to mild age-related cognitive impairment which appears stable. Mentions difference in blood pressure readings at home.    1.  Mild cognitive impairment -Subjectively, memory stable -MMSE 25/30 (prior 24/30)  -can consider use of Namenda or Aricept in  the future if needed but as cognition has been stable and nonprogressive, would recommend holding off at this time as she does have sensitivities to multiple medications -participation in cognitively challenging activities like solving crossword puzzles, playing bridge and sudoku.   -Continue memory compensation strategies.    2.  History of TIAs -Continue aspirin, and Leqvio   for stroke prevention and maintain aggressive risk factor modification strict control of hypertension with blood pressure goal below 130/90, lipids with LDL cholesterol goal below 70 mg percent and diabetes with hemoglobin A1c goal below 6.5% -okay to stop Zetia  at cholesterol levels remain stable and consistently under goal, request repeat at f/u visit with PCP in December, if LDL >70, will recommend restarting  -Stroke labs 05/2024: A1c 5.9, LDL 43  3. ?  Subclavian stenosis -blood pressure variation - RUE 138/64, LUE 158/78 - Unequal pulses, RUE 2+, LUE 3+ - otherwise overall asymptomatic - prior CTA 2022 without evidence of subclavian stenosis -  recommend completion of carotid ultrasound and possibly referred to [facility] for further evaluation/recommendations      Follow-up in 1 year or call earlier if needed    I personally spent a total of 40 minutes in the care of the patient today including preparing to see the patient, performing a medically appropriate exam/evaluation, counseling and educating, placing orders, and documenting clinical information in the EHR.   Renee [name], AGNP-BC  [facility] [address] [place], [place] [address]  Phone [phone] Fax [phone] Note: This document was prepared with digital dictation and possible smart phrase technology. Any transcriptional errors that result from this process are unintentional.

## 2024-07-17 ENCOUNTER — Encounter: Payer: Self-pay | Admitting: Adult Health

## 2024-07-17 ENCOUNTER — Ambulatory Visit (INDEPENDENT_AMBULATORY_CARE_PROVIDER_SITE_OTHER): Payer: Medicare Other | Admitting: Adult Health

## 2024-07-17 VITALS — BP 157/79 | HR 75 | Ht 63.0 in | Wt 134.0 lb

## 2024-07-17 DIAGNOSIS — G3184 Mild cognitive impairment, so stated: Secondary | ICD-10-CM

## 2024-07-17 DIAGNOSIS — G459 Transient cerebral ischemic attack, unspecified: Secondary | ICD-10-CM | POA: Diagnosis not present

## 2024-07-17 DIAGNOSIS — I771 Stricture of artery: Secondary | ICD-10-CM

## 2024-07-17 DIAGNOSIS — R0989 Other specified symptoms and signs involving the circulatory and respiratory systems: Secondary | ICD-10-CM | POA: Diagnosis not present

## 2024-07-17 NOTE — Patient Instructions (Addendum)
 Continue aspirin 81 mg daily and Leqvio  for secondary stroke prevention  You will be called to complete a carotid ultrasound to further evaluate for subclavian steal syndrome (see additional information below)   Okay to stop Zetia  at this time as your cholesterol levels have remained stable - will request repeat cholesterol levels at your follow up visit with your PCP in December. If your LDL is above 70, it will be recommended to restart Zetia    Ensure routine physical and cognitive exercises as well as ensuring good sleep, healthy diet and management of vascular risk factors  Signs of a Stroke? Follow the BEFAST method:  Balance Watch for a sudden loss of balance, trouble with coordination or vertigo Eyes Is there a sudden loss of vision in one or both eyes? Or double vision?  Face: Ask the person to smile. Does one side of the face droop or is it numb?  Arms: Ask the person to raise both arms. Does one arm drift downward? Is there weakness or numbness of a leg? Speech: Ask the person to repeat a simple phrase. Does the speech sound slurred/strange? Is the person confused ? Time: If you observe any of these signs, call 911.      Followup in the future with me in 1 year or call earlier if needed       Thank you for coming to see us  at [facility]. I hope we have been able to provide you high quality care today.  You may receive a patient satisfaction survey over the next few weeks. We would appreciate your feedback and comments so that we may continue to improve ourselves and the health of our patients.

## 2024-08-02 ENCOUNTER — Ambulatory Visit (INDEPENDENT_AMBULATORY_CARE_PROVIDER_SITE_OTHER): Admitting: Podiatry

## 2024-08-02 DIAGNOSIS — Q828 Other specified congenital malformations of skin: Secondary | ICD-10-CM

## 2024-08-02 NOTE — Progress Notes (Signed)
 Subjective:  Patient ID: Renee [name], female    DOB: 1946/11/11,  MRN: [id number]  No chief complaint on file.   78 y.o. female presents with the above complaint.  Patient presents with complaint of bilateral fifth metatarsal hyperkeratotic lesion.  Patient stated they are still very painful but the debridement does help.  She is in agreement thickened elongated dystrophic toenails x10 mild pain on palpation she would like for them to debride down to.   Review of Systems: Negative except as noted in the HPI. Denies N/V/F/Ch.  Past Medical History:  Diagnosis Date   Allergy    Anxiety    Arthritis    CAD (coronary artery disease)    non-obstructive. cath 11/11   Claustrophobia    Diabetes mellitus    borderline diabetic - diet controlled    GERD (gastroesophageal reflux disease)    History of chicken pox    History of phobia    clastrophobia   Hyperlipidemia    Hypertension    Hypothyroidism    Kidney stones    hx of    Lichen sclerosus    Psoriasis    Stroke (HCC)    Urinary tract infection    hx of     Current Outpatient Medications:    acetaminophen  (TYLENOL ) 650 MG CR tablet, Take 1,300 mg by mouth every 8 (eight) hours as needed for pain., Disp: , Rfl:    aspirin EC 81 MG tablet, Take 81 mg by mouth daily., Disp: , Rfl:    Carboxymethylcellulose Sodium (THERATEARS OP), Place 1 drop into both eyes at bedtime as needed (dry eyes)., Disp: , Rfl:    clobetasol  ointment (TEMOVATE ) 0.05 %, APPLY TOPICALLY ONCE DAILY AS NEEDED FOR PSORIASIS FLARES, Disp: 60 g, Rfl: 0   Dulaglutide  (TRULICITY ) 0.75 MG/0.5ML SOAJ, Inject 0.75 mg into the skin once a week., Disp: 2 mL, Rfl: 3   famotidine (PEPCID) 20 MG tablet, Take 20 mg by mouth daily as needed for heartburn or indigestion., Disp: , Rfl:    Flaxseed, Linseed, (FLAX SEED OIL PO), Take 1 capsule by mouth daily., Disp: , Rfl:    fluticasone (FLONASE) 50 MCG/ACT nasal spray, Place 1 spray into both nostrils daily as needed for  allergies., Disp: , Rfl:    Inclisiran Sodium  (LEQVIO  [place]), Inject into the skin every 6 (six) months. Next dose due in May, Disp: , Rfl:    levothyroxine  (SYNTHROID ) 75 MCG tablet, TAKE 1 TABLET BY MOUTH EVERY DAY, Disp: 90 tablet, Rfl: 0   losartan  (COZAAR ) 50 MG tablet, TAKE 1 TABLET BY MOUTH EVERY DAY, Disp: 90 tablet, Rfl: 1   MAGNESIUM PO, Take 1 tablet by mouth daily., Disp: , Rfl:    Multiple Vitamin (MULTIVITAMIN) tablet, Take 1 tablet by mouth daily., Disp: , Rfl:    Skin Protectants, Misc. (EUCERIN) cream, Apply 1 application  topically as needed (eczema)., Disp: , Rfl:    triamcinolone  cream (KENALOG ) 0.1 %, Apply 1 application topically 2 (two) times daily as needed (psoriasis)., Disp: 454 g, Rfl: 0   Vitamin D , Cholecalciferol, 25 MCG (1000 UT) CAPS, Take 2,000 Units by mouth daily., Disp: , Rfl:   Social History   Tobacco Use  Smoking Status Never  Smokeless Tobacco Never    Allergies  Allergen Reactions   Celexa  [Citalopram ] Shortness Of Breath and Swelling    Throat swelling and difficulty breathing   Other Rash    Metal    Codeine Nausea And Vomiting and Other (See Comments)  Metformin Nausea And Vomiting   Robaxin  [Methocarbamol ] Nausea Only   Tizanidine  Other (See Comments)    Oversedated   Crestor  [Rosuvastatin ] Rash   Nickel Rash   Simvastatin Swelling and Rash   Objective:   There were no vitals filed for this visit.  There is no height or weight on file to calculate BMI. Constitutional Well developed. Well nourished.  Vascular Dorsalis pedis pulses palpable bilaterally. Posterior tibial pulses palpable bilaterally. Capillary refill normal to all digits.  No cyanosis or clubbing noted. Pedal hair growth normal.  Neurologic Normal speech. Oriented to person, place, and time. Epicritic sensation to light touch grossly present bilaterally.  Dermatologic Nail Exam: Pt has thick disfigured discolored nails with subungual debris noted bilateral entire  nail hallux through fifth toenails.  Pain on palpation to the nails. No open wounds. No skin lesions.  Orthopedic: Bilateral submetatarsal 5 porokeratotic/benign skin lesion with central nucleated core noted.  Upon debridement no pinpoint bleeding noted.  No wounds noted.   Radiographs:   3 views of skeletally mature adult bilateral foot: Midfoot arthritis noted.  Plantar and posterior heel spurring noted.  Plantarflexed fifth metatarsal noted with mild signs of tailor's bunion.  There is increase in lateral deviation angle. Assessment:   1. Porokeratosis          Plan:  Patient was evaluated and treated and all questions answered.  Bilateral submetatarsal 5 porokeratosis x2 -X-ray to the patient the etiology of porokeratosis and was treatment options were extensively discussed.  Given the amount of pain she is having I believe she will benefit from debridement of the lesion followed by excision of the lesion.  I discussed this with the patient she states understanding like to proceed with that. -Using chisel blade and handle the lesion was debrided down to healthy striated tissue followed by excision of central nucleated core.  Immediate pain was noted after debridement and excision.  No pinpoint bleeding noted.  Onychomycosis with pain  -Nails palliatively debrided as below. -Educated on self-care  Procedure: Nail Debridement Rationale: pain  Type of Debridement: manual, sharp debridement. Instrumentation: Nail nipper, rotary burr. Number of Nails: 10  Procedures and Treatment: Consent by patient was obtained for treatment procedures. The patient understood the discussion of treatment and procedures well. All questions were answered thoroughly reviewed. Debridement of mycotic and hypertrophic toenails, 1 through 5 bilateral and clearing of subungual debris. No ulceration, no infection noted.  Return Visit-Office Procedure: Patient instructed to return to the office for a follow up  visit 3 months for continued evaluation and treatment.  Franky Blanch, DPM    No follow-ups on file.     No follow-ups on file.

## 2024-08-10 ENCOUNTER — Ambulatory Visit (HOSPITAL_COMMUNITY)

## 2024-08-14 ENCOUNTER — Encounter: Payer: Self-pay | Admitting: Neurology

## 2024-08-15 ENCOUNTER — Ambulatory Visit (HOSPITAL_COMMUNITY)
Admission: RE | Admit: 2024-08-15 | Discharge: 2024-08-15 | Disposition: A | Discharge status: Home / Self Care | Source: Ambulatory Visit | Attending: Adult Health | Admitting: Adult Health

## 2024-08-15 ENCOUNTER — Ambulatory Visit: Payer: Self-pay | Admitting: Adult Health

## 2024-08-15 DIAGNOSIS — I771 Stricture of artery: Secondary | ICD-10-CM | POA: Insufficient documentation

## 2024-09-08 ENCOUNTER — Other Ambulatory Visit: Payer: Self-pay | Admitting: Family Medicine

## 2024-09-08 DIAGNOSIS — E1159 Type 2 diabetes mellitus with other circulatory complications: Secondary | ICD-10-CM

## 2024-09-12 ENCOUNTER — Other Ambulatory Visit: Payer: Self-pay | Admitting: Family Medicine

## 2024-09-17 NOTE — Progress Notes (Unsigned)
 Cardiology Office Note  Date:  09/18/2024   ID:  Renee, [name] 1946-09-11, MRN [id number]  PCP:  Avelina Greig BRAVO, MD   Chief Complaint  Patient presents with   Follow-up    Atherosclerosis of native coronary artery of native heart with stable angina pectoris    HPI:  [name] is a  78 year old woman with a history of  hypertension,  hyperlipidemia,  long history of diabetes.  Cardiac catheterization October 2011 for chest pain showed mild to moderate non-obstructive CAD.  2010  with  EF of 55% to 60%.   40-50% LAD disease, 60% ostial diagonal disease, 40% PDA 2 knee replacements  in [place] Medication intolerances She presents today for follow-up of her coronary artery disease  Last seen by myself in clinic December 2024 Numerous things to discuss today Presents with her daughter  Reports having anxiety Feels depressed at times, anxious at times Rest with occasional tightness in the chest  Walks around the neighborhood, no chest pain or shortness of breath on exertion  Arthritis in her hands Eczema  Blood pressure well-controlled No leg edema  Lab work reviewed A1c 5.9 Total cholesterol 107 LDL 43 on leqvio   EKG personally reviewed by myself on todays visit EKG Interpretation Date/Time:  Tuesday September 18 2024 14:02:40 EDT Ventricular Rate:  77 PR Interval:  138 QRS Duration:  74 QT Interval:  368 QTC Calculation: 416 R Axis:   43  Text Interpretation: Normal sinus rhythm Normal ECG When compared with ECG of 17-Jun-2021 18:12, No significant change was found Confirmed by Perla Lye [id number]) on 09/18/2024 2:05:56 PM   Other past medical history reviewed In the hospital 05/2021: Possible stroke MRI: no CVA Neck CT: mild carotid disease Etiology unclear  Previously took her self off gemfibrozil  and Zetia , was having muscle aches Cholesterol jumped up greater than 200  put her self back on Zetia , no side effects  Previously reported muscle  hurting in arms, had cortisone , completed PT Followed by ortho and rheumatology  Elevated blood pressure today Blood pressure elevated on prior clinic visit Reports it is better controlled at home but did not bring any numbers with her Reports sometimes in the 1 teens to 130s May have whitecoat  Statin intolerance     PMH:   has a past medical history of Allergy, Anxiety, Arthritis, CAD (coronary artery disease), Claustrophobia, Diabetes mellitus, GERD (gastroesophageal reflux disease), History of chicken pox, History of phobia, Hyperlipidemia, Hypertension, Hypothyroidism, Kidney stones, Lichen sclerosus, Psoriasis, Stroke (HCC), and Urinary tract infection.  PSH:    Past Surgical History:  Procedure Laterality Date   APPENDECTOMY     CARDIAC CATHETERIZATION     2011   CARPAL TUNNEL RELEASE     Left trigger finger   CATARACT EXTRACTION W/PHACO Left 06/06/2018   Procedure: CATARACT EXTRACTION PHACO AND INTRAOCULAR LENS PLACEMENT (IOC);  Surgeon: Jaye Fallow, MD;  Location: [facility] ORS;  Service: Ophthalmology;  Laterality: Left;  US   00:23 AP% 16.0 CDE 3.69 Fluid pack lot # [id number]   CATARACT EXTRACTION W/PHACO Right 09/19/2018   Procedure: CATARACT EXTRACTION PHACO AND INTRAOCULAR LENS PLACEMENT (IOC);  Surgeon: Jaye Fallow, MD;  Location: [facility] ORS;  Service: Ophthalmology;  Laterality: Right;  US  00:29.5 AP% 17.0 CDE$ 5.03 Fluid pack lot # [id number]   COLONOSCOPY WITH PROPOFOL  N/A 05/13/2017   Procedure: COLONOSCOPY WITH PROPOFOL ;  Surgeon: Viktoria Lamar DASEN, MD;  Location[facility] ENDOSCOPY;  Service: Endoscopy;  Laterality: N/A;   ESOPHAGOGASTRODUODENOSCOPY (EGD) WITH PROPOFOL   N/A 01/04/2020   Procedure: ESOPHAGOGASTRODUODENOSCOPY (EGD) WITH PROPOFOL ;  Surgeon: Therisa Bi, MD;  Location: [facility] ENDOSCOPY;  Service: Gastroenterology;  Laterality: N/A;   EYE SURGERY  2004   lasik - right eye    JOINT REPLACEMENT     KNEE SURGERY Right 2015   arthroscopy   REVERSE SHOULDER  ARTHROPLASTY Right 10/21/2022   Procedure: REVERSE SHOULDER ARTHROPLASTY;  Surgeon: Melita Drivers, MD;  Location: [facility] ORS;  Service: Orthopedics;  Laterality: Right;    TOTAL KNEE ARTHROPLASTY Bilateral 06/12/2015   Procedure: TOTAL KNEE BILATERAL;  Surgeon: Dempsey Moan, MD;  Location: [facility] ORS;  Service: Orthopedics;  Laterality: Bilateral;  and epidural   TUBAL LIGATION     Tubes tied     and untied. and tied again    Current Outpatient Medications  Medication Sig Dispense Refill   acetaminophen  (TYLENOL ) 650 MG CR tablet Take 1,300 mg by mouth every 8 (eight) hours as needed for pain.     aspirin EC 81 MG tablet Take 81 mg by mouth daily.     Carboxymethylcellulose Sodium (THERATEARS OP) Place 1 drop into both eyes at bedtime as needed (dry eyes).     clobetasol  ointment (TEMOVATE ) 0.05 % APPLY TOPICALLY ONCE DAILY AS NEEDED FOR PSORIASIS FLARES 60 g 0   Dulaglutide  (TRULICITY ) 0.75 MG/0.5ML SOAJ Inject 0.75 mg into the skin once a week. 2 mL 3   famotidine (PEPCID) 20 MG tablet Take 20 mg by mouth daily as needed for heartburn or indigestion.     Flaxseed, Linseed, (FLAX SEED OIL PO) Take 1 capsule by mouth daily.     fluticasone (FLONASE) 50 MCG/ACT nasal spray Place 1 spray into both nostrils daily as needed for allergies.     Inclisiran Sodium  (LEQVIO  [place]) Inject into the skin every 6 (six) months. Next dose due in May     levothyroxine  (SYNTHROID ) 75 MCG tablet TAKE 1 TABLET BY MOUTH EVERY DAY 90 tablet 3   losartan  (COZAAR ) 50 MG tablet TAKE 1 TABLET BY MOUTH EVERY DAY 90 tablet 1   MAGNESIUM PO Take 1 tablet by mouth daily.     Multiple Vitamin (MULTIVITAMIN) tablet Take 1 tablet by mouth daily.     Skin Protectants, Misc. (EUCERIN) cream Apply 1 application  topically as needed (eczema).     triamcinolone  cream (KENALOG ) 0.1 % Apply 1 application topically 2 (two) times daily as needed (psoriasis). 454 g 0   Vitamin D , Cholecalciferol, 25 MCG (1000 UT) CAPS Take 2,000 Units  by mouth daily.     No current facility-administered medications for this visit.    Allergies:   Celexa  [citalopram ], Other, Codeine, Metformin, Robaxin  [methocarbamol ], Tizanidine , Crestor  [rosuvastatin ], Nickel, and Simvastatin   Social History:  The patient  reports that she has never smoked. She has never used smokeless tobacco. She reports current alcohol use. She reports that she does not use drugs.   Family History:   family history includes Arthritis in her brother and brother; Breast cancer (age of onset: 7) in her daughter; COPD in her mother; Diabetes in her brother, brother, mother, and sister; Heart disease in her brother and brother; Hypertension in her father, mother, and sister; Stomach cancer in her maternal grandfather; Stroke in her maternal grandfather.   Review of Systems: Review of Systems  Constitutional: Negative.   Respiratory: Negative.    Cardiovascular: Negative.   Gastrointestinal: Negative.   Musculoskeletal: Negative.   Neurological: Negative.   Psychiatric/Behavioral: Negative.    All other systems  reviewed and are negative.   PHYSICAL EXAM: VS:  There were no vitals taken for this visit. , BMI There is no height or weight on file to calculate BMI. Constitutional:  oriented to person, place, and time. No distress.  HENT:  Head: Normocephalic and atraumatic.  Eyes:  no discharge. No scleral icterus.  Neck: Normal range of motion. Neck supple. No JVD present.  Cardiovascular: Normal rate, regular rhythm, normal heart sounds and intact distal pulses. Exam reveals no gallop and no friction rub. No edema No murmur heard. Pulmonary/Chest: Effort normal and breath sounds normal. No stridor. No respiratory distress.  no wheezes.  no rales.  no tenderness.  Abdominal: Soft.  no distension.  no tenderness.  Musculoskeletal: Normal range of motion.  no  tenderness or deformity.  Neurological:  normal muscle tone. Coordination normal. No atrophy Skin: Skin is  warm and dry. No rash noted. not diaphoretic.  Psychiatric:  normal mood and affect. behavior is normal. Thought content normal.    Recent Labs: 06/20/2024: ALT 13; BUN 21; Creatinine, Ser 0.70; Potassium 4.5; Sodium 141; TSH 1.13   Lipid Panel Lab Results  Component Value Date   CHOL 107 06/20/2024   HDL 45.70 06/20/2024   LDLCALC 43 06/20/2024   TRIG 95.0 06/20/2024      Wt Readings from Last 3 Encounters:  07/17/24 134 lb (60.8 kg)  06/20/24 135 lb 6.4 oz (61.4 kg)  05/09/24 142 lb (64.4 kg)     ASSESSMENT AND PLAN:  Essential hypertension -  Blood pressure is well controlled on today's visit. No changes made to the medications.  Atherosclerosis of native coronary artery of native heart with stable angina pectoris - Plan: EKG 12-Lead Currently with no symptoms of angina. No further workup at this time. Continue current medication regimen.  Doing well on Leqvio   Hypercholesteremia Statin intolerance, Off Zetia , now on Leqvio  with good numbers  Type 2 diabetes mellitus with complication, unspecified long term insulin use status (HCC) A1c well-controlled Managed by primary care  Orders Placed This Encounter  Procedures   EKG 12-Lead      Signed, Velinda Lunger, M.D., Ph.D. 09/18/2024  [facility] [place], [place] [phone]

## 2024-09-18 ENCOUNTER — Ambulatory Visit: Attending: Cardiovascular Disease | Admitting: Cardiovascular Disease

## 2024-09-18 ENCOUNTER — Encounter: Payer: Self-pay | Admitting: Cardiovascular Disease

## 2024-09-18 VITALS — BP 126/64 | HR 77 | Ht 62.0 in | Wt 132.0 lb

## 2024-09-18 DIAGNOSIS — T466X5A Adverse effect of antihyperlipidemic and antiarteriosclerotic drugs, initial encounter: Secondary | ICD-10-CM | POA: Diagnosis not present

## 2024-09-18 DIAGNOSIS — T466X5S Adverse effect of antihyperlipidemic and antiarteriosclerotic drugs, sequela: Secondary | ICD-10-CM

## 2024-09-18 DIAGNOSIS — E119 Type 2 diabetes mellitus without complications: Secondary | ICD-10-CM | POA: Diagnosis not present

## 2024-09-18 DIAGNOSIS — M791 Myalgia, unspecified site: Secondary | ICD-10-CM | POA: Diagnosis not present

## 2024-09-18 DIAGNOSIS — I25118 Atherosclerotic heart disease of native coronary artery with other forms of angina pectoris: Secondary | ICD-10-CM | POA: Diagnosis not present

## 2024-09-18 DIAGNOSIS — E7800 Pure hypercholesterolemia, unspecified: Secondary | ICD-10-CM | POA: Insufficient documentation

## 2024-09-18 DIAGNOSIS — I10 Essential (primary) hypertension: Secondary | ICD-10-CM | POA: Diagnosis not present

## 2024-09-18 NOTE — Patient Instructions (Addendum)

## 2024-09-25 DIAGNOSIS — Z23 Encounter for immunization: Secondary | ICD-10-CM | POA: Diagnosis not present

## 2024-11-12 ENCOUNTER — Ambulatory Visit

## 2024-11-12 VITALS — BP 166/78 | HR 65 | Temp 97.3°F | Resp 16 | Ht 62.0 in | Wt 132.4 lb

## 2024-11-12 DIAGNOSIS — E1169 Type 2 diabetes mellitus with other specified complication: Secondary | ICD-10-CM

## 2024-11-12 DIAGNOSIS — E785 Hyperlipidemia, unspecified: Secondary | ICD-10-CM

## 2024-11-12 MED ORDER — INCLISIRAN SODIUM 284 MG/1.5ML ~~LOC~~ SOSY
284.0000 mg | PREFILLED_SYRINGE | Freq: Once | SUBCUTANEOUS | Status: AC
  Administered 2024-11-12: 284 mg via SUBCUTANEOUS
  Filled 2024-11-12: qty 1.5

## 2024-11-12 NOTE — Progress Notes (Signed)
 Diagnosis: Hyperlipidemia  Provider:  Chilton Greathouse MD  Procedure: Injection  Leqvio (inclisiran), Dose: 284 mg, Site: subcutaneous, Number of injections: 1  Injection Site(s): Right arm  Post Care: Patient declined observation  Discharge: Condition: Good, Destination: Home . AVS Provided  Performed by:  Wyvonne Lenz, RN

## 2024-11-19 ENCOUNTER — Ambulatory Visit: Payer: Self-pay

## 2024-11-19 ENCOUNTER — Encounter: Payer: Self-pay | Admitting: Neurology

## 2024-11-19 ENCOUNTER — Ambulatory Visit
Admission: EM | Admit: 2024-11-19 | Discharge: 2024-11-19 | Disposition: A | Discharge status: Home / Self Care | Attending: Family Medicine | Admitting: Family Medicine

## 2024-11-19 ENCOUNTER — Telehealth: Payer: Self-pay

## 2024-11-19 DIAGNOSIS — N3001 Acute cystitis with hematuria: Secondary | ICD-10-CM | POA: Insufficient documentation

## 2024-11-19 DIAGNOSIS — I10 Essential (primary) hypertension: Secondary | ICD-10-CM | POA: Insufficient documentation

## 2024-11-19 LAB — POCT URINE DIPSTICK
Bilirubin, UA: NEGATIVE
Glucose, UA: NEGATIVE mg/dL
Ketones, POC UA: NEGATIVE mg/dL
Nitrite, UA: NEGATIVE
Protein Ur, POC: 30 mg/dL — AB
Spec Grav, UA: 1.015 (ref 1.010–1.025)
Urobilinogen, UA: 0.2 U/dL
pH, UA: 7 (ref 5.0–8.0)

## 2024-11-19 MED ORDER — CEPHALEXIN 500 MG PO CAPS: 500.0000 mg | ORAL_CAPSULE | Freq: Three times a day (TID) | ORAL | 0 refills | Status: AC

## 2024-11-19 NOTE — ED Triage Notes (Signed)
 Pt c/o urinary freq,burning & pain x1 day. States also having lower abd pain at times. No OTC meds.

## 2024-11-19 NOTE — Telephone Encounter (Signed)
 PAP renewal for Trulicity  from [employer]. No answer and VM not available. Mailed pt portion, faxed provider portion

## 2024-11-19 NOTE — ED Provider Notes (Addendum)
 [facility]    CSN: [id number] Arrival date & time: 11/19/24  0902      History   Chief Complaint Chief Complaint  Patient presents with   Dysuria     HPI HPI Renee [name] is a 78 y.o. female.   History provided by patient and her son  Renee [name] presents for urinary frequency, dysuria and pelvic pain for the past day.  Tried nothing for symptoms prior to arrival.  Has  not had any antibiotics in last 30 days.  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   No LMP recorded. Patient is postmenopausal.    - Abnormal vaginal discharge: no - vaginal odor: no - vaginal bleeding: no - Dysuria: yes  - Hematuria: no - Urinary urgency: yes  - Urinary frequency: yes   - Fever: no - Abdominal pain: yes - Pelvic pain: yes  - Rash/Skin lesions/mouth ulcers: yes, psoriasis - Nausea: no  - Vomiting: no  - Back Pain:        Past Medical History:  Diagnosis Date   Allergy    Anxiety    Arthritis    CAD (coronary artery disease)    non-obstructive. 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   Senile purpura 06/08/2023   Pain in joint of left shoulder 11/03/2022   Statin myopathy 06/04/2022   Hordeolum externum of left upper eyelid 02/17/2022   Acute gout involving toe of right foot 08/12/2021   Chronic pain of both shoulders 08/12/2021   History of TIA (transient ischemic attack) 06/26/2021   Statin intolerance 01/15/2021   MDD (major depressive disorder), recurrent episode, moderate (HCC) 07/08/2020    Chronic insomnia 07/08/2020   Chronic neck pain 10/05/2019   Chronic diarrhea 03/30/2018   GAD (generalized anxiety disorder) 01/31/2018   Osteopenia 12/08/2017   Vitamin D  deficiency 03/04/2016   Allergic rhinitis 03/04/2016   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)  (HCC) 10/20/2015   External hemorrhoid 10/20/2015   Psoriasis 10/20/2015   History of bilateral knee replacement 10/20/2015   Osteoarthritis of knee 11/25/2014   Hypertension associated with diabetes (HCC) 11/06/2010   CAD, NATIVE VESSEL 11/06/2010   Hyperlipidemia associated with type 2 diabetes mellitus (HCC) 01/25/2006   Adult hypothyroidism 11/15/2005    Past Surgical History:  Procedure Laterality Date   APPENDECTOMY     CARDIAC CATHETERIZATION     2011   CARPAL TUNNEL RELEASE     Left trigger finger   CATARACT EXTRACTION W/PHACO Left 06/06/2018   Procedure: CATARACT EXTRACTION PHACO AND INTRAOCULAR LENS PLACEMENT (IOC);  Surgeon: Jaye Fallow, MD;  Location: [facility] ORS;  Service: Ophthalmology;  Laterality: Left;  US   00:23 AP% 16.0 CDE 3.69 Fluid pack lot # [id number]   CATARACT EXTRACTION W/PHACO Right 09/19/2018   Procedure: CATARACT EXTRACTION PHACO AND INTRAOCULAR LENS PLACEMENT (IOC);  Surgeon: Jaye Fallow, MD;  Location: [facility];  Service: Ophthalmology;  Laterality: Right;  US  00:29.5 AP% 17.0 CDE$ 5.03 Fluid pack lot # [id number]   COLONOSCOPY WITH PROPOFOL  N/A 05/13/2017   Procedure: COLONOSCOPY WITH PROPOFOL ;  Surgeon: Viktoria Lamar DASEN, MD;  [facility];  Service: Endoscopy;  Laterality: N/A;   ESOPHAGOGASTRODUODENOSCOPY (EGD) WITH PROPOFOL  N/A 01/04/2020   Procedure: ESOPHAGOGASTRODUODENOSCOPY (EGD) WITH PROPOFOL ;  Surgeon: Therisa Bi, MD;  Location: [facility] ENDOSCOPY;  Service: Gastroenterology;  Laterality: N/A;   EYE SURGERY  2004   lasik - right eye    JOINT REPLACEMENT     KNEE SURGERY Right 2015   arthroscopy   REVERSE SHOULDER ARTHROPLASTY  Right 10/21/2022   Procedure: REVERSE SHOULDER ARTHROPLASTY;  Surgeon: Melita Drivers, MD;  Location: [facility] ORS;  Service: Orthopedics;  Laterality: Right;    TOTAL KNEE ARTHROPLASTY Bilateral 06/12/2015   Procedure: TOTAL KNEE BILATERAL;  Surgeon: Dempsey Moan, MD;  Location: [facility] ORS;  Service: Orthopedics;  Laterality: Bilateral;  and epidural   TUBAL LIGATION     Tubes tied     and untied. and tied again    OB History     Gravida  6   Para  6   Term  6   Preterm      AB      Living  6      SAB      IAB      Ectopic      Multiple      Live Births  6            Home Medications    Prior to Admission medications   Medication Sig Start Date End Date Taking? Authorizing Provider  cephALEXin  (KEFLEX ) 500 MG capsule Take 1 capsule (500 mg total) by mouth 3 (three) times daily for 7 days. 11/19/24 11/26/24 Yes Mareon Robinette, DO  acetaminophen  (TYLENOL ) 650 MG CR tablet Take 1,300 mg by mouth every 8 (eight) hours as needed for pain.    [provider]  aspirin EC 81 MG tablet Take 81 mg by mouth daily.    [provider]  Carboxymethylcellulose Sodium (THERATEARS OP) Place 1 drop into both eyes at bedtime as needed (dry eyes).    [provider]  clobetasol  ointment (TEMOVATE ) 0.05 % APPLY TOPICALLY ONCE DAILY AS NEEDED FOR PSORIASIS FLARES 05/09/24   Bedsole, Amy E, MD  Dulaglutide  (TRULICITY ) 0.75 MG/0.5ML SOAJ Inject 0.75 mg into the skin once a week. Patient not taking: Reported on 09/18/2024 05/01/24   Avelina Greig BRAVO, MD  famotidine (PEPCID) 20 MG tablet Take 20 mg by mouth daily as needed for heartburn or indigestion.    [provider]  Flaxseed, Linseed, (FLAX SEED OIL PO) Take 1 capsule by mouth daily.    [provider]  fluticasone (FLONASE) 50 MCG/ACT nasal spray Place 1 spray into both nostrils daily as needed for allergies.    [provider]  Inclisiran Sodium  (LEQVIO  Raymond) Inject into the skin every 6  (six) months. Next dose due in May    [provider]  levothyroxine  (SYNTHROID ) 75 MCG tablet TAKE 1 TABLET BY MOUTH EVERY DAY 09/12/24   Bedsole, Amy E, MD  losartan  (COZAAR ) 50 MG tablet TAKE 1 TABLET BY MOUTH EVERY DAY 09/10/24   Bedsole, Amy E, MD  MAGNESIUM PO Take 1 tablet by mouth daily.    [provider]  Multiple Vitamin (MULTIVITAMIN) tablet Take 1 tablet by mouth daily.    [provider]  Skin Protectants, Misc. (EUCERIN) cream Apply 1 application  topically as needed (eczema).    [provider]  triamcinolone  cream (KENALOG ) 0.1 % Apply 1 application topically 2 (two) times daily as needed (psoriasis). 12/04/21   Bedsole, Amy E, MD  Vitamin D , Cholecalciferol, 25 MCG (1000 UT) CAPS Take 2,000 Units by mouth daily.    [provider]    Family History Family History  Problem Relation Age of Onset   COPD Mother    Diabetes Mother    Hypertension Mother    Hypertension Father    Diabetes Sister    Hypertension Sister    Diabetes Brother    Arthritis Brother    Heart disease Brother    Arthritis Brother    Diabetes Brother    Heart disease Brother    Stomach cancer Maternal Grandfather    Stroke Maternal Grandfather    Breast cancer Daughter 46       Double mastectomy    Social History Social History   Tobacco Use   Smoking status: Never   Smokeless tobacco: Never  Vaping Use   Vaping status: Never Used  Substance Use Topics   Alcohol use: Yes    Comment: rare   Drug use: No     Allergies   Celexa  [citalopram ], Other, Codeine, Metformin, Robaxin  [methocarbamol ], Tizanidine , Crestor  [rosuvastatin ], Nickel, and Simvastatin   Review of Systems Review of Systems: :negative unless otherwise stated in HPI.      Physical Exam Triage Vital Signs ED Triage Vitals  Encounter Vitals Group     BP 11/19/24 1025 (!) 180/73     Girls Systolic BP Percentile --      Girls Diastolic BP Percentile --      Boys Systolic  BP Percentile --      Boys Diastolic BP Percentile --      Pulse Rate 11/19/24 1025 84     Resp 11/19/24 1025 18     Temp 11/19/24 1025 98.4 F (36.9 C)     Temp Source 11/19/24 1025 Oral     SpO2 11/19/24 1025 98 %     Weight 11/19/24 1019 131 lb 3.2 oz (59.5 kg)     Height --      Head Circumference --      Peak Flow --      Pain Score 11/19/24 1022 8     Pain Loc --      Pain Education --      Exclude from Growth Chart --    No data found.  Updated Vital Signs BP (!) 150/70 (BP Location: Right Arm)   Pulse 84   Temp 98.4 F (36.9 C) (Oral)   Resp 18   Wt 59.5 kg   SpO2 98%   BMI 24.00 kg/m   Visual Acuity Right Eye Distance:   Left Eye Distance:   Bilateral Distance:    Right Eye Near:   Left Eye Near:    Bilateral Near:     Physical Exam GEN: well appearing female in no acute distress  CVS: well perfused, regular rate and rhythm RESP: speaking in full sentences without pause, clear bilaterally   ABD: soft, lower abdominal tenderness, non-distended, no palpable masses, right CVA tenderness   MSK:  midline lumbar TTP, bilateral paraspinal muscle tenderness, good ROM SKIN: warm, dry    UC Treatments / Results  Labs (all labs ordered are listed, but only abnormal results are displayed) Labs Reviewed  POCT URINE DIPSTICK - Abnormal;  Notable for the following components:      Result Value   Blood, UA trace-intact (*)    Protein Ur, POC =30 (*)    Leukocytes, UA Small (1+) (*)    All other components within normal limits  URINE CULTURE    EKG   Radiology No results found.  Procedures Procedures (including critical care time)  Medications Ordered in UC Medications - No data to display  Initial Impression / Assessment and Plan / UC Course  I have reviewed the triage vital signs and the nursing notes.  Pertinent labs & imaging results that were available during my care of the patient were reviewed by me and considered in my medical decision making  (see chart for details).       Acute cystitis:  Patient is a 78 y.o. female  who presents for 1 day of dysuria and urinary frequency.  Overall patient is well-appearing and afebrile.  Vital signs stable.   POC urine dipstick consistent with acute cystitis.   Hematuria   present but no  microscopy available.  Previous urine culture was pansensitive. Treat with Keflex  3 times daily for 7 days.   Urine culture obtained.  Follow-up sensitivities and change antibiotics, if needed. 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 Chart review reveal she was diagnosed with lichen sclerosis by her GYN.  Return precautions including abdominal pain, fever, chills, nausea, or vomiting given. Follow-up with gynecologist.   Chronic Hypertension:  Claramae is hypertensive here at 180/73 and repeat was 150/70 after resting. She is prescribed 50 mg Losartan .   Suspect elevated due to pain.  Advised to keep log of blood pressures as she states systolic BP were in the 160s at home. Follow up with PCP.    Discussed MDM, treatment plan and plan for follow-up with patient who agrees with plan.        Final Clinical Impressions(s) / UC Diagnoses   Final diagnoses:  Acute cystitis with hematuria  Elevated blood pressure reading with diagnosis of hypertension     Discharge Instructions      You have a urinary tract infection. I sent your urine for culture to be sure the antibiotic prescribed will treat your infection. Someone may call you to change antibiotics. Stop by the pharmacy to pick up your prescriptions.  Follow up with your primary care provider or return to the urgent care, if not improving.   Follow up with your gynecologist regarding her psoriasis flare up in the groin area.   Monitor your blood pressure daily for the next 2 weeks.  Follow up with your primary care provider, if persistently over 140 /90.      ED Prescriptions     Medication Sig Dispense Auth. Provider   cephALEXin  (KEFLEX ) 500 MG  capsule Take 1 capsule (500 mg total) by mouth 3 (three) times daily for 7 days. 21 capsule Ladasha Schnackenberg, DO      PDMP not reviewed this encounter.      Kriste Berth, DO 11/19/24 1218

## 2024-11-19 NOTE — Telephone Encounter (Signed)
 FYI Only or Action Required?: FYI only for provider: UC advised.  Patient was last seen in primary care on 06/20/2024 by Avelina Greig BRAVO, MD.  Called Nurse Triage reporting Flank Pain and Extremity Weakness.  Symptoms began several days ago.  Interventions attempted: Rest, hydration, or home remedies.  Symptoms are: gradually worsening.  Triage Disposition: See Physician Within 24 Hours  Patient/caregiver understands and will follow disposition?: Yes         Copied from CRM #[id number]. Topic: Clinical - Red Word Triage >> Nov 19, 2024  8:05 AM Robinson DEL wrote: [facility] that prompted transfer to Nurse Triage: Bad infection in bladder kidney pain in stomach and side up towards ribs in back can't hardly move arms up can't use hands Reason for Disposition  Blood in urine (red, pink, or tea-colored)    Pt prefers same-day appt-- no PCP access within pt preference. Triager advised [facility] as alternative. Pt verbalized understanding.  Answer Assessment - Initial Assessment Questions 1. LOCATION: Where does it hurt? (e.g., left, right)     Stomach, ribs-- my kidneys, back 2. ONSET: When did the pain start?     X 1 week 3. SEVERITY: How bad is the pain? (e.g., Scale 1-10; mild, moderate, or severe)     Affected sleep 4. PATTERN: Does the pain come and go, or is it constant?      constant 5. CAUSE: What do you think is causing the pain?     Thinks may be related to bladder infection 6. OTHER SYMPTOMS:  Do you have any other symptoms? (e.g., fever, abdomen pain, vomiting, leg weakness, burning with urination, blood in urine)     Hx of arthritis in hands and arms. Possible hematuria. 7. PREGNANCY:  Is there any chance you are pregnant? When was your last menstrual period?     N/a  Protocols used: Flank Pain-A-AH

## 2024-11-19 NOTE — Discharge Instructions (Addendum)
 You have a urinary tract infection. I sent your urine for culture to be sure the antibiotic prescribed will treat your infection. Someone may call you to change antibiotics. Stop by the pharmacy to pick up your prescriptions.  Follow up with your primary care provider or return to the urgent care, if not improving.   Follow up with your gynecologist regarding her psoriasis flare up in the groin area.   Monitor your blood pressure daily for the next 2 weeks.  Follow up with your primary care provider, if persistently over 140 /90.

## 2024-11-20 LAB — URINE CULTURE

## 2024-11-20 NOTE — Progress Notes (Signed)
    GYNECOLOGY PROGRESS NOTE  Subjective:  PCP: Avelina Greig BRAVO, MD  Patient ID: Renee [name], female    DOB: 07/22/1946, 78 y.o.   MRN: [id number]  HPI  Patient is a 78 y.o. [id number] female who presents for worsening lichen sclerosis, biopsied 01/17/23.  She is currently using Temovate  several times a week.  Dr. Starla had prescribed vaginal estrogen cream for vulvar atrophy, but it was too expensive for her to continue.  She states she has a flare up and wants a stronger medication. She has not used the clobetasol  in 5 days. She recently had a UTI, s/p Keflex  through urgent care, thinks the clobetasol  was the cause of it.   The following portions of the patient's history were reviewed and updated as appropriate: allergies, current medications, past family history, past medical history, past social history, past surgical history, and problem list.  Review of Systems Pertinent items are noted in HPI.   Objective:   Blood pressure 130/80, height 5' 3 (1.6 m), weight 129 lb (58.5 kg). Body mass index is 22.85 kg/m.  General appearance: alert and cooperative Abdomen: soft, non-tender; bowel sounds normal; no masses,  no organomegaly Pelvic: deferred; vulva inflamed with white patches, see images Extremities: extremities normal, atraumatic, no cyanosis or edema Neurologic: Grossly normal    Assessment/Plan:   1. Lichen sclerosus et atrophicus   2. Vaginal yeast infection   3. Vulvar atrophy     78 y.o. H3E3993 with bx-dx LS, with a recent flare and not using Temovate , after completing Keflex  for a UTI dx in urgent care. We discussed that she possibly developed a yeast infection after the antibiotics, explaining why she felt Temovate  was worsening her itching. Also with atrophy, which may have contributed to the UTI, but pt declines vaginal estrogen due to cost and side effects/burning.  -Rx for Diflucan x 2 -After completing Dilfucan, re-start Temovate  BID x 2wks; then decrease to daily if  still itching, until sx gone -Went over [employer] pt education handout on LS together and provided to patient, emphasized lifelong nature of LS, as she questioned why it keeps coming back.  -Contact clinic if sx still persist after 3-4wks, otherwise, follow up 78yr for LS exam; notify sooner if worsening.   Total time was 43 minutes. That includes chart review before the visit, the actual patient visit, and time spent on documentation after the visit. Time excludes procedures, if any.    Estil Mangle, DO [facility]

## 2024-11-21 ENCOUNTER — Ambulatory Visit: Payer: Self-pay

## 2024-11-26 ENCOUNTER — Ambulatory Visit: Payer: Self-pay

## 2024-11-26 NOTE — Telephone Encounter (Signed)
 FYI Only or Action Required?: FYI only for provider: appointment scheduled on 12/4.  Patient was last seen in primary care on 06/20/2024 by Renee Greig BRAVO, MD.  Called Nurse Triage reporting Hand Pain.  Symptoms began a week ago.  Interventions attempted: OTC medications: tylenol  arthritis and Rest, hydration, or home remedies.  Symptoms are: gradually worsening.  Triage Disposition: See PCP When Office is Open (Within 3 Days)  Patient/caregiver understands and will follow disposition?: Yes, will follow disposition  Copied from CRM #[id number]. Topic: Clinical - Red Word Triage >> Nov 26, 2024 11:42 AM Roselie BROCKS wrote: [facility] that prompted transfer to Nurse Triage: Patient states she is having issues with arthritis in both hands all the way to her elbows. States both hands are extremely painful ,hard time moving them,and fingers and wrists are swollen and hurt. Reason for Disposition  [1] MODERATE pain (e.g., interferes with normal activities) AND [2] present > 3 days  Answer Assessment - Initial Assessment Questions 1. ONSET: When did the pain start?     About a week this time 2. LOCATION: Where is the pain located?     Bilateral  3. PAIN: How bad is the pain? (Scale 1-10; or mild, moderate, severe)     Extremely painful 5. CAUSE: What do you think is causing the pain?     Unsure, states that she has been treating it as arthritis, states she has seen arthritis doctor 6. AGGRAVATING FACTORS: What makes the pain worse? (e.g., using computer)     movement 7. OTHER SYMPTOMS: Do you have any other symptoms? (e.g., fever, neck pain, numbness or tingling, rash, swelling)     Swollen  Using heating pad and topical.  Protocols used: Hand Pain-A-AH

## 2024-11-26 NOTE — Telephone Encounter (Signed)
 With Family Medicine Darra Ring, MD) 11/29/2024 at 2:00 PM

## 2024-11-27 ENCOUNTER — Encounter: Payer: Self-pay | Admitting: Neurology

## 2024-11-27 ENCOUNTER — Ambulatory Visit: Admitting: Obstetrics

## 2024-11-27 ENCOUNTER — Encounter: Payer: Self-pay | Admitting: Obstetrics

## 2024-11-27 VITALS — BP 130/80 | Ht 63.0 in | Wt 129.0 lb

## 2024-11-27 DIAGNOSIS — L900 Lichen sclerosus et atrophicus: Secondary | ICD-10-CM | POA: Diagnosis not present

## 2024-11-27 DIAGNOSIS — B3731 Acute candidiasis of vulva and vagina: Secondary | ICD-10-CM

## 2024-11-27 DIAGNOSIS — N905 Atrophy of vulva: Secondary | ICD-10-CM

## 2024-11-27 MED ORDER — CLOBETASOL PROPIONATE 0.05 % EX OINT: TOPICAL_OINTMENT | Freq: Two times a day (BID) | CUTANEOUS | 5 refills | Status: AC

## 2024-11-27 MED ORDER — FLUCONAZOLE 150 MG PO TABS: 150.0000 mg | ORAL_TABLET | ORAL | 0 refills | Status: DC

## 2024-11-27 NOTE — Patient Instructions (Signed)
 Your prescriptions have been sent to [facility], per your request.   1) You may have a possible yeast infection after the antibiotics for UTI. Start Diflucan right away and take 2 doses, one now, and another 3 days later.   2) After completing the 2 doses of Diflucan, then re-start the Temovate  cream. Apply AM and PM for two weeks, then can decrease to once a day as needed if still itching.   Call our office if your symptoms are not improving after doing the above. Otherwise, if improving, continue the Temovate  once daily until the itching is completely gone.

## 2024-11-28 ENCOUNTER — Encounter: Payer: Self-pay | Admitting: Neurology

## 2024-11-28 NOTE — Telephone Encounter (Signed)
Noted. Will see.

## 2024-11-29 ENCOUNTER — Ambulatory Visit: Admitting: Family Medicine

## 2024-11-29 VITALS — BP 207/96 | HR 84 | Temp 98.6°F | Ht 62.5 in | Wt 130.2 lb

## 2024-11-29 DIAGNOSIS — R0789 Other chest pain: Secondary | ICD-10-CM | POA: Diagnosis not present

## 2024-11-29 DIAGNOSIS — R350 Frequency of micturition: Secondary | ICD-10-CM | POA: Diagnosis not present

## 2024-11-29 DIAGNOSIS — E1159 Type 2 diabetes mellitus with other circulatory complications: Secondary | ICD-10-CM | POA: Diagnosis not present

## 2024-11-29 DIAGNOSIS — R1013 Epigastric pain: Secondary | ICD-10-CM | POA: Diagnosis not present

## 2024-11-29 DIAGNOSIS — R82998 Other abnormal findings in urine: Secondary | ICD-10-CM | POA: Diagnosis not present

## 2024-11-29 DIAGNOSIS — I152 Hypertension secondary to endocrine disorders: Secondary | ICD-10-CM

## 2024-11-29 LAB — POC URINALSYSI DIPSTICK (AUTOMATED)
Bilirubin, UA: NEGATIVE
Glucose, UA: NEGATIVE
Ketones, UA: NEGATIVE
Nitrite, UA: NEGATIVE
Protein, UA: POSITIVE — AB
Spec Grav, UA: 1.01 (ref 1.010–1.025)
Urobilinogen, UA: 0.2 U/dL
pH, UA: 6 (ref 5.0–8.0)

## 2024-11-29 NOTE — Assessment & Plan Note (Signed)
 Recently treated for UTI with some improvement of symptoms but urine culture done previously showed contamination.  Urinalysis today again concerning for possible infection will send for repeat culture. Urinary symptoms may be secondary to lichen sclerosis and overactive bladder.

## 2024-11-29 NOTE — Patient Instructions (Addendum)
 Stop meloxicam .  Stop at lab on way out.  Can start pepcid AC twice daily  Try tramadol  at night for arthritis pain. Continue tlyneol and Voltaren gel on hands and arms.  Increase losartan  to 100 mg daily.. follow  BP at home .[name] Goal < 140 /90

## 2024-11-29 NOTE — Progress Notes (Signed)
 Patient ID: Renee [name], female    DOB: 11/29/1946, 78 y.o.   MRN: [id number]  This visit was conducted in person.  BP (!) 207/96   Pulse 84   Temp 98.6 F (37 C) (Temporal)   Ht 5' 2.5 (1.588 m)   Wt 130 lb 4 oz (59.1 kg)   SpO2 97%   BMI 23.44 kg/m    CC:  Chief Complaint  Patient presents with   Arm Pain    See at Urgent Care 11/19/2024   Hand Pain        Abdominal Pain    Epigastric Pain   Headache    Subjective:   HPI: Renee [name] is a 78 y.o. female presenting on 11/29/2024 for Arm Pain (See at Urgent Care 11/19/2024), Hand Pain (/), Abdominal Pain (Epigastric Pain), and Headache  Recent office visit November 19, 2024 at urgent care for acute cystitis. Started on cephalexin  500 mg 3 times a day for 7 days. Urine culture showed no specific bacteria, multiple types, possible contamination  At that office visit she was noted to have an elevated blood pressure 150-180/70-73 despite losartan  50 mg daily.  Felt it was elevated likely due to pain.    Blood pressure again elevated in office today. BP Readings from Last 3 Encounters:  11/29/24 (!) 207/96  11/27/24 130/80  11/19/24 (!) 150/70   At home BP was 130/70s  She reports  new onset pain in central chest  since taking antibiotics for UTI. Radiates to left chest.  Intermittent.  Heart may be racing.  Ongoing x 3 weeks.  No change with eating.  Has tried  TUMs.. did not help. Feels like she has to burp. Feeling tired with exertion.   2 weeks ago started meloxicam .. may have started stomach and GERD since.    NO SOB.  Was having some rib pain bilaterally  prior   Pain in both  hands and arms. Has a headache, back of neck told  by neurology... had neuralgia.    Saw GYN for lichen sclerosis and vaginal yeast on 12/2/.. took fluconazole and topical cream. Less burning and itching. Less urinary frequency.    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 Cardiac  catheterization October 2011 for chest pain showed mild to moderate non-obstructive CAD.  2010  with  EF of 55% to 60%.   40-50% LAD disease, 60% ostial diagonal disease, 40% PDA   Stopped trulicity  given  possibly swimmy headed feeling.  FBS 110 Relevant past medical, surgical, family and social history reviewed and updated as indicated. Interim medical history since our last visit reviewed. Allergies and medications reviewed and updated. Outpatient Medications Prior to Visit  Medication Sig Dispense Refill   acetaminophen  (TYLENOL ) 650 MG CR tablet Take 1,300 mg by mouth every 8 (eight) hours as needed for pain.     aspirin EC 81 MG tablet Take 81 mg by mouth daily.     Carboxymethylcellulose Sodium (THERATEARS OP) Place 1 drop into both eyes at bedtime as needed (dry eyes).     clobetasol  ointment (TEMOVATE ) 0.05 % Apply topically 2 (two) times daily. For two weeks. Then decrease to daily as needed for symptoms. 60 g 5   famotidine (PEPCID) 20 MG tablet Take 20 mg by mouth daily as needed for heartburn or indigestion.     Flaxseed, Linseed, (FLAX SEED OIL PO) Take 1 capsule by mouth daily.     fluconazole (DIFLUCAN) 150 MG tablet  Take 1 tablet (150 mg total) by mouth every 3 (three) days. 2 tablet 0   fluticasone (FLONASE) 50 MCG/ACT nasal spray Place 1 spray into both nostrils daily as needed for allergies.     Inclisiran Sodium  (LEQVIO  Weingarten) Inject into the skin every 6 (six) months. Next dose due in May     levothyroxine  (SYNTHROID ) 75 MCG tablet TAKE 1 TABLET BY MOUTH EVERY DAY 90 tablet 3   losartan  (COZAAR ) 50 MG tablet TAKE 1 TABLET BY MOUTH EVERY DAY 90 tablet 1   MAGNESIUM PO Take 1 tablet by mouth daily.     Multiple Vitamin (MULTIVITAMIN) tablet Take 1 tablet by mouth daily.     Skin Protectants, Misc. (EUCERIN) cream Apply 1 application  topically as needed (eczema).     triamcinolone  cream (KENALOG ) 0.1 % Apply 1 application topically 2 (two) times daily as needed (psoriasis).  454 g 0   Vitamin D , Cholecalciferol, 25 MCG (1000 UT) CAPS Take 2,000 Units by mouth daily.     Dulaglutide  (TRULICITY ) 0.75 MG/0.5ML SOAJ Inject 0.75 mg into the skin once a week. (Patient not taking: Reported on 11/29/2024) 2 mL 3   No facility-administered medications prior to visit.     Per HPI unless specifically indicated in ROS section below Review of Systems  Constitutional:  Negative for fatigue and fever.  HENT:  Negative for congestion.   Eyes:  Negative for pain.  Respiratory:  Negative for cough and shortness of breath.   Cardiovascular:  Positive for chest pain. Negative for palpitations and leg swelling.  Gastrointestinal:  Positive for abdominal pain. Negative for blood in stool, diarrhea, nausea, rectal pain and vomiting.  Genitourinary:  Negative for dysuria and vaginal bleeding.  Musculoskeletal:  Negative for back pain.  Neurological:  Negative for syncope, light-headedness and headaches.  Psychiatric/Behavioral:  Negative for dysphoric mood.    Objective:  BP (!) 207/96   Pulse 84   Temp 98.6 F (37 C) (Temporal)   Ht 5' 2.5 (1.588 m)   Wt 130 lb 4 oz (59.1 kg)   SpO2 97%   BMI 23.44 kg/m   Wt Readings from Last 3 Encounters:  11/29/24 130 lb 4 oz (59.1 kg)  11/27/24 129 lb (58.5 kg)  11/19/24 131 lb 3.2 oz (59.5 kg)      Physical Exam Constitutional:      General: She is not in acute distress.    Appearance: Normal appearance. She is well-developed. She is not ill-appearing or toxic-appearing.  HENT:     Head: Normocephalic.     Right Ear: Hearing, tympanic membrane, ear canal and external ear normal. Tympanic membrane is not erythematous, retracted or bulging.     Left Ear: Hearing, tympanic membrane, ear canal and external ear normal. Tympanic membrane is not erythematous, retracted or bulging.     Nose: No mucosal edema or rhinorrhea.     Right Sinus: No maxillary sinus tenderness or frontal sinus tenderness.     Left Sinus: No maxillary sinus  tenderness or frontal sinus tenderness.     Mouth/Throat:     Pharynx: Uvula midline.  Eyes:     General: Lids are normal. Lids are everted, no foreign bodies appreciated.     Conjunctiva/sclera: Conjunctivae normal.     Pupils: Pupils are equal, round, and reactive to light.  Neck:     Thyroid : No thyroid  mass or thyromegaly.     Vascular: No carotid bruit.     Trachea: Trachea normal.  Cardiovascular:  Rate and Rhythm: Normal rate and regular rhythm.     Pulses: Normal pulses.     Heart sounds: Normal heart sounds, S1 normal and S2 normal. No murmur heard.    No friction rub. No gallop.  Pulmonary:     Effort: Pulmonary effort is normal. No tachypnea or respiratory distress.     Breath sounds: Normal breath sounds. No decreased breath sounds, wheezing, rhonchi or rales.  Chest:       Comments: Ttp in central chest  Abdominal:     General: Bowel sounds are normal.     Palpations: Abdomen is soft.     Tenderness: There is abdominal tenderness in the epigastric area. There is no right CVA tenderness or left CVA tenderness.  Musculoskeletal:     Cervical back: Normal range of motion and neck supple.  Skin:    General: Skin is warm and dry.     Findings: No rash.  Neurological:     Mental Status: She is alert and oriented to person, place, and time.     GCS: GCS eye subscore is 4. GCS verbal subscore is 5. GCS motor subscore is 6.     Cranial Nerves: No cranial nerve deficit.     Sensory: No sensory deficit.     Motor: No abnormal muscle tone.     Coordination: Coordination normal.     Gait: Gait normal.     Deep Tendon Reflexes: Reflexes are normal and symmetric.     Comments: Nml cerebellar exam   No papilledema  Psychiatric:        Mood and Affect: Mood is not anxious or depressed.        Speech: Speech normal.        Behavior: Behavior normal. Behavior is cooperative.        Thought Content: Thought content normal.        Cognition and Memory: Memory is not  impaired. She does not exhibit impaired recent memory or impaired remote memory.        Judgment: Judgment normal.       Results for orders placed or performed in visit on 11/29/24  POCT Urinalysis Dipstick (Automated)   Collection Time: 11/29/24  3:08 PM  Result Value Ref Range   Color, UA yellow    Clarity, UA clear    Glucose, UA Negative Negative   Bilirubin, UA negative    Ketones, UA negative    Spec Grav, UA 1.010 1.010 - 1.025   Blood, UA moderate (A)    pH, UA 6.0 5.0 - 8.0   Protein, UA Positive (A) Negative   Urobilinogen, UA 0.2 0.2 or 1.0 E.U./dL   Nitrite, UA neg    Leukocytes, UA Moderate (2+) (A) Negative    Assessment and Plan  Hypertension associated with diabetes (HCC) Assessment & Plan: Acute, blood pressure very elevated in office today.  Per patient blood pressure runs well at home and she did have a normal blood pressure check at the gynecologist.  Blood pressure rechecked but remained elevated.  Recommend patient increase losartan  to 100 mg daily.  Will follow-up in 2 weeks.   Epigastric pain Assessment & Plan: Acute, possible stomach upset due to meloxicam .  Differential diagnosis includes gastritis, peptic ulcer disease, cardiovascular source (see atypical chest pain note) versus pancreatitis versus gallbladder disease.  Will have patient stop this.  Will evaluate with labs for other secondary causes. Will have her start Pepcid AC 1 tablet twice daily to block acid.  May need to consider proton pump inhibitor if not improving.  Orders: -     Comprehensive metabolic panel with GFR -     Lipase -     CBC with Differential/Platelet  Atypical chest pain Assessment & Plan: Acute, patient is a poor historian and her discussion of pain was unclear.  Symptoms may be secondary to referred GI pain.  EKG in office today showed normal sinus rhythm, no LVH no ST changes no Q waves. Symptoms are not clearly cardiac in origin.  Orders: -     EKG  12-Lead  Urinary frequency Assessment & Plan: Recently treated for UTI with some improvement of symptoms but urine culture done previously showed contamination.  Urinalysis today again concerning for possible infection will send for repeat culture. Urinary symptoms may be secondary to lichen sclerosis and overactive bladder.  Orders: -     POCT Urinalysis Dipstick (Automated)  Leukocytes in urine -     Urine Culture    Return in about 2 weeks (around 12/13/2024) for  HTN, arhtritis and stomach pain.   Greig Ring, MD

## 2024-11-29 NOTE — Assessment & Plan Note (Addendum)
 Acute, blood pressure very elevated in office today.  Per patient blood pressure runs well at home and she did have a normal blood pressure check at the gynecologist.  Blood pressure rechecked but remained elevated.  Recommend patient increase losartan  to 100 mg daily.  Will follow-up in 2 weeks.

## 2024-11-29 NOTE — Assessment & Plan Note (Signed)
 Acute, possible stomach upset due to meloxicam .  Differential diagnosis includes gastritis, peptic ulcer disease, cardiovascular source (see atypical chest pain note) versus pancreatitis versus gallbladder disease.  Will have patient stop this.  Will evaluate with labs for other secondary causes. Will have her start Pepcid AC 1 tablet twice daily to block acid.  May need to consider proton pump inhibitor if not improving.

## 2024-11-29 NOTE — Assessment & Plan Note (Signed)
 Acute, patient is a poor historian and her discussion of pain was unclear.  Symptoms may be secondary to referred GI pain.  EKG in office today showed normal sinus rhythm, no LVH no ST changes no Q waves. Symptoms are not clearly cardiac in origin.

## 2024-11-30 ENCOUNTER — Other Ambulatory Visit: Payer: Self-pay | Admitting: Family Medicine

## 2024-11-30 ENCOUNTER — Telehealth: Payer: Self-pay | Admitting: *Deleted

## 2024-11-30 ENCOUNTER — Ambulatory Visit: Payer: Self-pay | Admitting: Family Medicine

## 2024-11-30 ENCOUNTER — Telehealth: Payer: Self-pay | Admitting: Family Medicine

## 2024-11-30 DIAGNOSIS — E119 Type 2 diabetes mellitus without complications: Secondary | ICD-10-CM

## 2024-11-30 DIAGNOSIS — E1169 Type 2 diabetes mellitus with other specified complication: Secondary | ICD-10-CM

## 2024-11-30 DIAGNOSIS — E559 Vitamin D deficiency, unspecified: Secondary | ICD-10-CM

## 2024-11-30 LAB — COMPREHENSIVE METABOLIC PANEL WITH GFR
ALT: 19 U/L (ref 0–35)
AST: 27 U/L (ref 0–37)
Albumin: 4.2 g/dL (ref 3.5–5.2)
Alkaline Phosphatase: 73 U/L (ref 39–117)
BUN: 15 mg/dL (ref 6–23)
CO2: 24 meq/L (ref 19–32)
Calcium: 9.8 mg/dL (ref 8.4–10.5)
Chloride: 101 meq/L (ref 96–112)
Creatinine, Ser: 0.54 mg/dL (ref 0.40–1.20)
GFR: 87.06 mL/min (ref >60.00)
Glucose, Bld: 107 mg/dL — ABNORMAL HIGH (ref 70–99)
Potassium: 3.8 meq/L (ref 3.5–5.1)
Sodium: 137 meq/L (ref 135–145)
Total Bilirubin: 0.4 mg/dL (ref 0.2–1.2)
Total Protein: 7.9 g/dL (ref 6.0–8.3)

## 2024-11-30 LAB — CBC WITH DIFFERENTIAL/PLATELET
Basophils Absolute: 0.1 K/uL (ref 0.0–0.1)
Basophils Relative: 1 % (ref 0.0–3.0)
Eosinophils Absolute: 0.1 K/uL (ref 0.0–0.7)
Eosinophils Relative: 1 % (ref 0.0–5.0)
HCT: 36.5 % (ref 36.0–46.0)
Hemoglobin: 12.2 g/dL (ref 12.0–15.0)
Lymphocytes Relative: 24.3 % (ref 12.0–46.0)
Lymphs Abs: 2.3 K/uL (ref 0.7–4.0)
MCHC: 33.4 g/dL (ref 30.0–36.0)
MCV: 93.2 fl (ref 78.0–100.0)
Monocytes Absolute: 1.3 K/uL — ABNORMAL HIGH (ref 0.1–1.0)
Monocytes Relative: 13.5 % — ABNORMAL HIGH (ref 3.0–12.0)
Neutro Abs: 5.7 K/uL (ref 1.4–7.7)
Neutrophils Relative %: 60.2 % (ref 43.0–77.0)
Platelets: 487 K/uL — ABNORMAL HIGH (ref 150.0–400.0)
RBC: 3.91 Mil/uL (ref 3.87–5.11)
RDW: 12.7 % (ref 11.5–15.5)
WBC: 9.5 K/uL (ref 4.0–10.5)

## 2024-11-30 LAB — URINE CULTURE
MICRO NUMBER:: 17313225
SPECIMEN QUALITY:: ADEQUATE

## 2024-11-30 LAB — LIPASE: Lipase: 23 U/L (ref 11.0–59.0)

## 2024-11-30 MED ORDER — TRAMADOL HCL 50 MG PO TABS: 25.0000 mg | ORAL_TABLET | Freq: Three times a day (TID) | ORAL | 0 refills | Status: AC | PRN

## 2024-11-30 NOTE — Telephone Encounter (Signed)
-----   Message from [name] Du sent at 11/30/2024  9:43 AM EST ----- Regarding: Lab Mon 12/17/24 Hello,  Patient is coming in for CPE labs. Can we get orders please.   Thanks

## 2024-11-30 NOTE — Telephone Encounter (Signed)
 Renee [name] notified by telephone that Tramadol  Rx has been sent to [facility] in [place].

## 2024-11-30 NOTE — Telephone Encounter (Signed)
 Copied from CRM #[id number]. Topic: Clinical - Medication Question >> Nov 30, 2024  9:27 AM Renee [name] wrote: Reason for CRM: Pt stated that she recently seen Dr.Bedsole on 11/29/24 and was informed that she would be getting a medication sent for pain. Pt stated that she contacted the pharmacy and they stated that they don't have a request for any medications. Pt would like for Dr.Bedsole to send the prescription to the pharmacy today if possible and would like a callback with an update. >> Nov 30, 2024 11:36 AM Renee [name] wrote: Patient called checking on a medication-tramadol  that was supposed to have been sent to the pharmacy on yesterday. I  did let the patient know that the previous message was sent to the provider and they will give her a call back before 3pm today [phone]

## 2024-11-30 NOTE — Telephone Encounter (Signed)
 Copied from CRM #[id number]. Topic: Clinical - Medication Question >> Nov 30, 2024  9:27 AM Revonda D wrote: Reason for CRM: Pt stated that she recently seen Dr.Bedsole on 11/29/24 and was informed that she would be getting a medication sent for pain. Pt stated that she contacted the pharmacy and they stated that they don't have a request for any medications. Pt would like for Dr.Bedsole to send the prescription to the pharmacy today if possible and would like a callback with an update.

## 2024-12-04 NOTE — Telephone Encounter (Signed)
 Received provider portion [facility] Trul[facility]  today waiting on pt portion.

## 2024-12-05 ENCOUNTER — Encounter: Payer: Self-pay | Admitting: Neurology

## 2024-12-10 ENCOUNTER — Ambulatory Visit: Payer: Self-pay

## 2024-12-10 NOTE — Telephone Encounter (Signed)
 Next Appt With Family Medicine Darra Ring, MD) 12/12/2024 at 11:40 AM

## 2024-12-10 NOTE — Telephone Encounter (Signed)
 FYI Only or Action Required?: Action required by provider: clinical question for provider and update on patient condition.  Patient was last seen in primary care on 11/29/2024 by Avelina Greig BRAVO, MD.  Called Nurse Triage reporting Headache, Neck Pain, Generalized Body Aches, and Medication Problem.  Symptoms began several weeks ago.  Interventions attempted: OTC medications: Tylenol  and Pepcid and Other: warm water  soaks.  Symptoms are: unchanged.  Triage Disposition: See PCP When Office is Open (Within 3 Days)  Patient/caregiver understands and will follow disposition?: Yes                                  1. ONSET: When did the muscle aches or body pains start?      Ongoing several weeks 2. LOCATION: What part of your body is hurting? (e.g., entire body, arms, legs)      Head, neck, bilateral hands, bilateral legs- whole body pain 3. SEVERITY: How bad is the pain? (Scale 1-10; or mild, moderate, severe)     Rates pain a 8-9 at this time, states pain was worse at OV 2 weeks ago  4. CAUSE: What do you think is causing the pains?     Originally thought to be arthritis, patient now suspects pain could be related to LEQVIO  injection 5. FEVER: Do you have a fever? If Yes, ask: What is your temperature, how was it measured, and  when did it start?      Denies 6. OTHER SYMPTOMS: Do you have any other symptoms? (e.g., chest pain, cold or flu symptoms, rash, weakness, weight loss)     Headache towards scalp/top of head- reports this has been ongoing since OV 2 weeks ago, states she can hardly move her hands at times- reports this has been ongoing since OV 2 weeks ago, reports she is still experiencing indigestion- reports this was discussed at OV 2 weeks ago Denies dizziness, denies vision changes, denies speech changes, denies one-sided weakness, denies chest pain Recently treated for yeast infection/UTI- states symptoms are improving States BP has  decreased since taking new dosage of BP medication    Patient called in to report ongoing body pain since OV on 11/29/24. Patient stated symptoms have not improved with Tylenol  use. Patient would like to ask provider if symptoms could be related to the LEQVIO  injection she receives every 6 months, as she has looked into the side effects of this medication. This RN advised I would route question/concern to PCP. Please advise. This RN advised patient to attend follow-up OV this week as scheduled.   Copied from CRM #[id number]. Topic: Clinical - Red Word Triage >> Dec 10, 2024 11:41 AM Deleta RAMAN wrote: Red Word that prompted transfer to Nurse Triage: patient states she is not getting better states it's getting worse and problems with medicine may be a reaction. Neck and head are in pain states it hurts  Reason for Disposition  [1] MODERATE pain (e.g., interferes with normal activities) AND [2] present > 3 days  Protocols used: Muscle Aches and Body Pain-A-AH

## 2024-12-11 NOTE — Telephone Encounter (Signed)
 Tammy (daughter) notified as instructed by telephone.  It was the neurologist that prescribed this for Ms. Debell, not Dr. Gollen.

## 2024-12-12 ENCOUNTER — Ambulatory Visit: Admitting: Family Medicine

## 2024-12-12 ENCOUNTER — Encounter: Payer: Self-pay | Admitting: Family Medicine

## 2024-12-12 VITALS — BP 140/62 | HR 81 | Temp 98.0°F | Ht 62.5 in | Wt 126.0 lb

## 2024-12-12 DIAGNOSIS — R1013 Epigastric pain: Secondary | ICD-10-CM | POA: Diagnosis not present

## 2024-12-12 DIAGNOSIS — E1159 Type 2 diabetes mellitus with other circulatory complications: Secondary | ICD-10-CM | POA: Diagnosis not present

## 2024-12-12 DIAGNOSIS — E1169 Type 2 diabetes mellitus with other specified complication: Secondary | ICD-10-CM

## 2024-12-12 DIAGNOSIS — E785 Hyperlipidemia, unspecified: Secondary | ICD-10-CM | POA: Diagnosis not present

## 2024-12-12 DIAGNOSIS — E039 Hypothyroidism, unspecified: Secondary | ICD-10-CM | POA: Diagnosis not present

## 2024-12-12 DIAGNOSIS — M2550 Pain in unspecified joint: Secondary | ICD-10-CM | POA: Insufficient documentation

## 2024-12-12 DIAGNOSIS — E559 Vitamin D deficiency, unspecified: Secondary | ICD-10-CM | POA: Diagnosis not present

## 2024-12-12 DIAGNOSIS — E119 Type 2 diabetes mellitus without complications: Secondary | ICD-10-CM | POA: Insufficient documentation

## 2024-12-12 DIAGNOSIS — I152 Hypertension secondary to endocrine disorders: Secondary | ICD-10-CM | POA: Diagnosis not present

## 2024-12-12 LAB — COMPREHENSIVE METABOLIC PANEL WITH GFR
ALT: 14 U/L (ref 3–35)
AST: 20 U/L (ref 5–37)
Albumin: 3.9 g/dL (ref 3.5–5.2)
Alkaline Phosphatase: 74 U/L (ref 39–117)
BUN: 19 mg/dL (ref 6–23)
CO2: 27 meq/L (ref 19–32)
Calcium: 9.7 mg/dL (ref 8.4–10.5)
Chloride: 102 meq/L (ref 96–112)
Creatinine, Ser: 0.77 mg/dL (ref 0.40–1.20)
GFR: 72.93 mL/min (ref >60.00)
Glucose, Bld: 99 mg/dL (ref 70–99)
Potassium: 4.6 meq/L (ref 3.5–5.1)
Sodium: 140 meq/L (ref 135–145)
Total Bilirubin: 0.4 mg/dL (ref 0.2–1.2)
Total Protein: 7.6 g/dL (ref 6.0–8.3)

## 2024-12-12 LAB — MICROALBUMIN / CREATININE URINE RATIO
Creatinine,U: 135 mg/dL
Microalb Creat Ratio: 27.5 mg/g (ref 0.0–30.0)
Microalb, Ur: 3.7 mg/dL — ABNORMAL HIGH (ref 0.7–1.9)

## 2024-12-12 LAB — LIPID PANEL
Cholesterol: 90 mg/dL (ref 28–200)
HDL: 42 mg/dL (ref >39.00)
LDL Cholesterol: 31 mg/dL (ref 10–99)
NonHDL: 48.17
Total CHOL/HDL Ratio: 2
Triglycerides: 84 mg/dL (ref 10.0–149.0)
VLDL: 16.8 mg/dL (ref 0.0–40.0)

## 2024-12-12 LAB — VITAMIN D 25 HYDROXY (VIT D DEFICIENCY, FRACTURES): VITD: 52.41 ng/mL (ref 30.00–100.00)

## 2024-12-12 LAB — HEMOGLOBIN A1C: Hgb A1c MFr Bld: 7 % — ABNORMAL HIGH (ref 4.6–6.5)

## 2024-12-12 MED ORDER — TRAMADOL HCL 50 MG PO TABS: 50.0000 mg | ORAL_TABLET | Freq: Two times a day (BID) | ORAL | 0 refills | Status: AC | PRN

## 2024-12-12 NOTE — Assessment & Plan Note (Signed)
 Acute, significantly improved since stopping meloxicam  and starting famotidine twice daily.  Recommended continuing this.

## 2024-12-12 NOTE — Progress Notes (Signed)
 Patient ID: Renee [name], female    DOB: [birth year], 78 y.o.   MRN: [id number]  This visit was conducted in person.  BP (!) 140/62   Pulse 81   Temp 98 F (36.7 C) (Oral)   Ht 5' 2.5 (1.588 m)   Wt 126 lb (57.2 kg)   SpO2 96%   BMI 22.68 kg/m    CC:  Chief Complaint  Patient presents with   Hypertension    Pt here for f/u for HTN. Daughter was not able to make the visit today but sent a note for you to read. PT CC Stomach pain x 1 mth. Patient would like a referral to specialist for arthritis. Patient is accompanied by her Son [name]    Subjective:   HPI: Renee [name] is a 78 y.o. female presenting on 12/12/2024 for Hypertension (Pt here for f/u for HTN. Daughter was not able to make the visit today but sent a note for you to read. PT CC Stomach pain x 1 mth. Patient would like a referral to specialist for arthritis. Patient is accompanied by her Son [name])  Son [name] in office today. Reviewed note sent by daughter:  Pt has been complaining of HA, neck pain, generalized body aches x several weeks  Possible SE of LeQvio   Epigastric pain has improved.  At last OV I instructed her to stop meloxicam  given stomach irritation possible from this.  Using famotidine for twice daily.   Joint pain: hands  and wrists very sore... started feeling bad after Lequvio  shot in 10/2024 ( no SE after earlier doses but with this dose she had pain in arm after injection within 30 MIN) The next day fingers were  swollen and sore.  Also having some headache.  Using tramadol   1/2 tablet twice daily... helps a little... no SE.  Using tylenol    500 mg  2 tablets  twice daily as well  Has seen rheum in past Dr. Dolphus.  X-ray in hands in past 07/2021 note reviewed  OA seen, no clear RA.  Does have psoriasis.[name] but no mention of psoriatic arthritis mentioned.  HTN: at recent OV 11/29/2024 BP elevated  Recommended increase in losartan  to 100 mg daily.. BP control has improved. BP Readings from Last  3 Encounters:  12/12/24 (!) 140/62  11/29/24 (!) 207/96  11/27/24 130/80           Relevant past medical, surgical, family and social history reviewed and updated as indicated. Interim medical history since our last visit reviewed. Allergies and medications reviewed and updated. Outpatient Medications Prior to Visit  Medication Sig Dispense Refill   acetaminophen  (TYLENOL ) 650 MG CR tablet Take 1,300 mg by mouth every 8 (eight) hours as needed for pain.     aspirin EC 81 MG tablet Take 81 mg by mouth daily.     Carboxymethylcellulose Sodium (THERATEARS OP) Place 1 drop into both eyes at bedtime as needed (dry eyes).     clobetasol  ointment (TEMOVATE ) 0.05 % Apply topically 2 (two) times daily. For two weeks. Then decrease to daily as needed for symptoms. 60 g 5   Dulaglutide  (TRULICITY ) 0.75 MG/0.5ML SOAJ Inject 0.75 mg into the skin once a week. 2 mL 3   famotidine (PEPCID) 20 MG tablet Take 20 mg by mouth daily as needed for heartburn or indigestion.     Flaxseed, Linseed, (FLAX SEED OIL PO) Take 1 capsule by mouth daily.     fluconazole  (DIFLUCAN ) 150 MG  tablet Take 1 tablet (150 mg total) by mouth every 3 (three) days. 2 tablet 0   fluticasone (FLONASE) 50 MCG/ACT nasal spray Place 1 spray into both nostrils daily as needed for allergies.     Inclisiran Sodium  (LEQVIO  Pump Back) Inject into the skin every 6 (six) months. Next dose due in May     levothyroxine  (SYNTHROID ) 75 MCG tablet TAKE 1 TABLET BY MOUTH EVERY DAY 90 tablet 3   losartan  (COZAAR ) 50 MG tablet TAKE 1 TABLET BY MOUTH EVERY DAY 90 tablet 1   MAGNESIUM PO Take 1 tablet by mouth daily.     Multiple Vitamin (MULTIVITAMIN) tablet Take 1 tablet by mouth daily.     Skin Protectants, Misc. (EUCERIN) cream Apply 1 application  topically as needed (eczema).     triamcinolone  cream (KENALOG ) 0.1 % Apply 1 application topically 2 (two) times daily as needed (psoriasis). 454 g 0   Vitamin D , Cholecalciferol, 25 MCG (1000 UT) CAPS Take  2,000 Units by mouth daily.     No facility-administered medications prior to visit.     Per HPI unless specifically indicated in ROS section below Review of Systems  Constitutional:  Negative for fatigue and fever.  HENT:  Negative for congestion.   Eyes:  Negative for pain.  Respiratory:  Negative for cough and shortness of breath.   Cardiovascular:  Negative for chest pain, palpitations and leg swelling.  Gastrointestinal:  Negative for abdominal pain.  Genitourinary:  Negative for dysuria and vaginal bleeding.  Musculoskeletal:  Negative for back pain.  Neurological:  Negative for syncope, light-headedness and headaches.  Psychiatric/Behavioral:  Negative for dysphoric mood.    Objective:  BP (!) 140/62   Pulse 81   Temp 98 F (36.7 C) (Oral)   Ht 5' 2.5 (1.588 m)   Wt 126 lb (57.2 kg)   SpO2 96%   BMI 22.68 kg/m   Wt Readings from Last 3 Encounters:  12/12/24 126 lb (57.2 kg)  11/29/24 130 lb 4 oz (59.1 kg)  11/27/24 129 lb (58.5 kg)      Physical Exam Constitutional:      General: She is not in acute distress.    Appearance: Normal appearance. She is well-developed. She is not ill-appearing or toxic-appearing.  HENT:     Head: Normocephalic.     Right Ear: Hearing, tympanic membrane, ear canal and external ear normal. Tympanic membrane is not erythematous, retracted or bulging.     Left Ear: Hearing, tympanic membrane, ear canal and external ear normal. Tympanic membrane is not erythematous, retracted or bulging.     Nose: No mucosal edema or rhinorrhea.     Right Sinus: No maxillary sinus tenderness or frontal sinus tenderness.     Left Sinus: No maxillary sinus tenderness or frontal sinus tenderness.     Mouth/Throat:     Pharynx: Uvula midline.  Eyes:     General: Lids are normal. Lids are everted, no foreign bodies appreciated.     Conjunctiva/sclera: Conjunctivae normal.     Pupils: Pupils are equal, round, and reactive to light.  Neck:     Thyroid : No  thyroid  mass or thyromegaly.     Vascular: No carotid bruit.     Trachea: Trachea normal.  Cardiovascular:     Rate and Rhythm: Normal rate and regular rhythm.     Pulses: Normal pulses.     Heart sounds: Normal heart sounds, S1 normal and S2 normal. No murmur heard.    No friction rub.  No gallop.  Pulmonary:     Effort: Pulmonary effort is normal. No tachypnea or respiratory distress.     Breath sounds: Normal breath sounds. No decreased breath sounds, wheezing, rhonchi or rales.  Abdominal:     General: Bowel sounds are normal.     Palpations: Abdomen is soft.     Tenderness: There is no abdominal tenderness.  Musculoskeletal:     Right wrist: Swelling and tenderness present. Decreased range of motion.     Left wrist: Swelling and tenderness present. Decreased range of motion.     Right hand: Swelling present. No tenderness. Decreased range of motion.     Left hand: Swelling and tenderness present. Decreased range of motion.     Cervical back: Normal range of motion and neck supple.  Skin:    General: Skin is warm and dry.     Findings: No rash.  Neurological:     Mental Status: She is alert.  Psychiatric:        Mood and Affect: Mood is not anxious or depressed.        Speech: Speech normal.        Behavior: Behavior normal. Behavior is cooperative.        Thought Content: Thought content normal.        Judgment: Judgment normal.       Results for orders placed or performed in visit on 11/29/24  POCT Urinalysis Dipstick (Automated)   Collection Time: 11/29/24  3:08 PM  Result Value Ref Range   Color, UA yellow    Clarity, UA clear    Glucose, UA Negative Negative   Bilirubin, UA negative    Ketones, UA negative    Spec Grav, UA 1.010 1.010 - 1.025   Blood, UA moderate (A)    pH, UA 6.0 5.0 - 8.0   Protein, UA Positive (A) Negative   Urobilinogen, UA 0.2 0.2 or 1.0 E.U./dL   Nitrite, UA neg    Leukocytes, UA Moderate (2+) (A) Negative  Urine Culture   Collection  Time: 11/29/24  3:28 PM   Specimen: Blood  Result Value Ref Range   MICRO NUMBER: [id number]    SPECIMEN QUALITY: Adequate    Sample Source BLOOD PER REQUISTION    STATUS: FINAL    Result:      Less than 10,000 CFU/mL of single Gram negative organism isolated. No further testing will be performed. If clinically indicated, recollection using a method to minimize contamination, with prompt transfer to Urine Culture Transport Tube, is recommended.  Comprehensive metabolic panel with GFR   Collection Time: 11/29/24  3:28 PM  Result Value Ref Range   Sodium 137 135 - 145 mEq/L   Potassium 3.8 3.5 - 5.1 mEq/L   Chloride 101 96 - 112 mEq/L   CO2 24 19 - 32 mEq/L   Glucose, Bld 107 (H) 70 - 99 mg/dL   BUN 15 6 - 23 mg/dL   Creatinine, Ser 9.45 0.40 - 1.20 mg/dL   Total Bilirubin 0.4 0.2 - 1.2 mg/dL   Alkaline Phosphatase 73 39 - 117 U/L   AST 27 0 - 37 U/L   ALT 19 0 - 35 U/L   Total Protein 7.9 6.0 - 8.3 g/dL   Albumin 4.2 3.5 - 5.2 g/dL   GFR 12.93 >39.99 mL/min   Calcium  9.8 8.4 - 10.5 mg/dL  Lipase   Collection Time: 11/29/24  3:28 PM  Result Value Ref Range   Lipase 23.0 11.0 - 59.0  U/L  CBC with Differential/Platelet   Collection Time: 11/29/24  3:28 PM  Result Value Ref Range   WBC 9.5 4.0 - 10.5 K/uL   RBC 3.91 3.87 - 5.11 Mil/uL   Hemoglobin 12.2 12.0 - 15.0 g/dL   HCT 63.4 63.9 - 53.9 %   MCV 93.2 78.0 - 100.0 fl   MCHC 33.4 30.0 - 36.0 g/dL   RDW 87.2 88.4 - 84.4 %   Platelets 487.0 (H) 150.0 - 400.0 K/uL   Neutrophils Relative % 60.2 43.0 - 77.0 %   Lymphocytes Relative 24.3 12.0 - 46.0 %   Monocytes Relative 13.5 (H) 3.0 - 12.0 %   Eosinophils Relative 1.0 0.0 - 5.0 %   Basophils Relative 1.0 0.0 - 3.0 %   Neutro Abs 5.7 1.4 - 7.7 K/uL   Lymphs Abs 2.3 0.7 - 4.0 K/uL   Monocytes Absolute 1.3 (H) 0.1 - 1.0 K/uL   Eosinophils Absolute 0.1 0.0 - 0.7 K/uL   Basophils Absolute 0.1 0.0 - 0.1 K/uL    Assessment and Plan  Epigastric pain Assessment & Plan: Acute,  significantly improved since stopping meloxicam  and starting famotidine twice daily.  Recommended continuing this.   Hypertension associated with diabetes [facility]) Assessment & Plan: Chronic, improved in office today with increased dose of losartan . She has still had some elevated numbers at home which could be related to current joint pain. She will continue current regimen, treat pain and follow blood pressure at home.  She will call if blood pressure is above 150/90 persistently. Will follow-up on this in 3 months.   Arthralgia, unspecified joint Assessment & Plan: Acute worsening of chronic issue She does report some significant arm and hand pain since getting the Leqvio  injection in November. It is difficult to tell if her current symptoms are side effect or just a flare of severe osteoarthritis.  I am leading to more flare of osteoarthritis given she did not have any initial symptoms after original injections ofleqvio. I encouraged her to talk with neurologist about it. Looking back she has always had significant hand wrist symptoms even when seeing a rheumatologist in 2022.  No autoimmune disease such as rheumatoid arthritis or psoriatic arthritis (despite patient's psoriasis) diagnosed at that point.  Diagnosed with osteoarthritis.   NSAIDs contraindicated with current stomach issues.  Side effects of burning to Voltaren gel. Will increase tramadol  to 50 mg p.o. twice daily as needed pain.    Hyperlipidemia associated with type 2 diabetes mellitus (HCC) Assessment & Plan: Due for reevaluation.  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     Vitamin D  deficiency Assessment & Plan: Low vitamin D  could potentially be causing aches symptoms.  Orders: -     VITAMIN D  25 Hydroxy (Vit-D Deficiency, Fractures)  Diabetes mellitus without  complication (HCC) -     Microalbumin / creatinine urine ratio -     Hemoglobin A1c  Adult hypothyroidism Assessment & Plan:  Due for re-evaluation.  Orders: -     TSH -     T3, free -     T4, free  Other orders -     traMADol  HCl; Take 1 tablet (50 mg total) by mouth every 12 (twelve) hours as needed.  Dispense: 60 tablet; Refill: 0    Return in about 6 weeks (around  01/23/2025) for  Cancel 12/22 lab visit, keep [facility], and postpone the annual with [facility] for  4 weeks.[name] Greig Ring, MD

## 2024-12-12 NOTE — Assessment & Plan Note (Signed)
 Chronic, improved in office today with increased dose of losartan . She has still had some elevated numbers at home which could be related to current joint pain. She will continue current regimen, treat pain and follow blood pressure at home.  She will call if blood pressure is above 150/90 persistently. Will follow-up on this in 3 months.

## 2024-12-12 NOTE — Assessment & Plan Note (Addendum)
 Due for re-eval.

## 2024-12-12 NOTE — Addendum Note (Signed)
 Addended by: ISADORA RAISIN on: 12/12/2024 03:01 PM   Modules accepted: Orders

## 2024-12-12 NOTE — Assessment & Plan Note (Signed)
 Low vitamin D  could potentially be causing aches symptoms.

## 2024-12-12 NOTE — Assessment & Plan Note (Signed)
 Due for reevaluation

## 2024-12-12 NOTE — Assessment & Plan Note (Signed)
 Acute worsening of chronic issue She does report some significant arm and hand pain since getting the Leqvio  injection in November. It is difficult to tell if her current symptoms are side effect or just a flare of severe osteoarthritis.  I am leading to more flare of osteoarthritis given she did not have any initial symptoms after original injections ofleqvio. I encouraged her to talk with neurologist about it. Looking back she has always had significant hand wrist symptoms even when seeing a rheumatologist in 2022.  No autoimmune disease such as rheumatoid arthritis or psoriatic arthritis (despite patient's psoriasis) diagnosed at that point.  Diagnosed with osteoarthritis.   NSAIDs contraindicated with current stomach issues.  Side effects of burning to Voltaren gel. Will increase tramadol  to 50 mg p.o. twice daily as needed pain.

## 2024-12-13 ENCOUNTER — Ambulatory Visit: Payer: Self-pay | Admitting: Family Medicine

## 2024-12-13 ENCOUNTER — Ambulatory Visit (INDEPENDENT_AMBULATORY_CARE_PROVIDER_SITE_OTHER)

## 2024-12-13 DIAGNOSIS — E039 Hypothyroidism, unspecified: Secondary | ICD-10-CM | POA: Diagnosis not present

## 2024-12-13 LAB — T3, FREE: T3, Free: 2.7 pg/mL (ref 2.3–4.2)

## 2024-12-13 LAB — TSH: TSH: 4.74 u[IU]/mL (ref 0.35–5.50)

## 2024-12-13 LAB — T4, FREE: Free T4: 0.85 ng/dL (ref 0.60–1.60)

## 2024-12-17 ENCOUNTER — Ambulatory Visit: Payer: Medicare Other

## 2024-12-17 ENCOUNTER — Other Ambulatory Visit

## 2024-12-17 ENCOUNTER — Telehealth: Payer: Self-pay | Admitting: Neurology

## 2024-12-17 VITALS — BP 139/72 | Ht 62.5 in | Wt 127.0 lb

## 2024-12-17 DIAGNOSIS — Z0000 Encounter for general adult medical examination without abnormal findings: Secondary | ICD-10-CM | POA: Diagnosis not present

## 2024-12-17 NOTE — Telephone Encounter (Signed)
 I called daughter of pt.  She said that pt had had her last injection of Leqvio  11-12-2024.  Since that time has c/o hand, ribs, leg, feet headache symptoms.  Has been to pcp and they have asked to call neuro about possible SE of this injection.  Question of getting Leqvio  in light of having arthritis/psoriasis hx.  I asked Dr. Rosemarie, he said that most common SE irritation, redness , rash at injection site this usually after 3 days of injection.  These symptoms  she is experiencing is not usual and he has not heard of them from other pts.  She will let pcp know, she appreciated call back.   What are the common side effects of LEQVIO ?The most common side effect of LEQVIO  is injection site reaction (including pain, redness, and rash), which occurred in 8% of people taking LEQVIO  vs 2% with placebo. Other side effects people reported with LEQVIO  included joint pain (5% vs 4% with placebo) and chest cold (4% vs 3% with placebo).

## 2024-12-17 NOTE — Patient Instructions (Signed)
 Renee [name],  Thank you for taking the time for your Medicare Wellness Visit. I appreciate your continued commitment to your health goals. Please review the care plan we discussed, and feel free to reach out if I can assist you further.  Please note that Annual Wellness Visits do not include a physical exam. Some assessments may be limited, especially if the visit was conducted virtually. If needed, we may recommend an in-person follow-up with your provider.  Ongoing Care Seeing your primary care provider every 3 to 6 months helps us  monitor your health and provide consistent, personalized care.   Referrals If a referral was made during today's visit and you haven't received any updates within two weeks, please contact the referred provider directly to check on the status.  Recommended Screenings:  Health Maintenance  Topic Date Due   Zoster (Shingles) Vaccine (1 of 2) Never done   Colon Cancer Screening  05/13/2022   DTaP/Tdap/Td vaccine (2 - Td or Tdap) 09/27/2022   Complete foot exam   06/07/2024   COVID-19 Vaccine (7 - 2025-26 season) 08/27/2024   Medicare Annual Wellness Visit  12/14/2024   Breast Cancer Screening  01/22/2025   Hemoglobin A1C  06/12/2025   Eye exam for diabetics  06/25/2025   Yearly kidney function blood test for diabetes  12/12/2025   Yearly kidney health urinalysis for diabetes  12/12/2025   Osteoporosis screening with Bone Density Scan  12/31/2026   Pneumococcal Vaccine for age over 27  Completed   Flu Shot  Completed   Meningitis B Vaccine  Aged Out   Hepatitis C Screening  Discontinued       12/15/2023    3:42 PM  Advanced Directives  Does Patient Have a Medical Advance Directive? No    Vision: Annual vision screenings are recommended for early detection of glaucoma, cataracts, and diabetic retinopathy. These exams can also reveal signs of chronic conditions such as diabetes and high blood pressure.  Dental: Annual dental screenings help detect  early signs of oral cancer, gum disease, and other conditions linked to overall health, including heart disease and diabetes.  Please see the attached documents for additional preventive care recommendations.

## 2024-12-17 NOTE — Progress Notes (Signed)
 " I connected with  Renee [name] on 12/17/2024 by a video and audio enabled telemedicine application and verified that I am speaking with the correct person using two identifiers.  Patient Location: Home  Provider Location: Home Office  Persons Participating in Visit: Patient.  I discussed the limitations of evaluation and management by telemedicine. The patient expressed understanding and agreed to proceed.  Vital Signs: Because this visit was a virtual/telehealth visit, some criteria may be missing or patient reported. Any vitals not documented were not able to be obtained and vitals that have been documented are patient reported.  Chief Complaint  Patient presents with   Medicare Wellness     Subjective:   Renee [name] is a 78 y.o. female who presents for a Medicare Annual Wellness Visit.  Visit info / Clinical Intake: Medicare Wellness Visit Type:: Subsequent Annual Wellness Visit Persons participating in visit and providing information:: patient Medicare Wellness Visit Mode:: Video Since this visit was completed virtually, some vitals may be partially provided or unavailable. Missing vitals are due to the limitations of the virtual format.: Documented vitals are patient reported If Telephone or Video please confirm:: I connected with patient using audio/video enable telemedicine. I verified patient identity with two identifiers, discussed telehealth limitations, and patient agreed to proceed. Patient Location:: home Provider Location:: home office Interpreter Needed?: No Pre-visit prep was completed: yes AWV questionnaire completed by patient prior to visit?: no Living arrangements:: lives with spouse/significant other Patient's Overall Health Status Rating: good Typical amount of pain: some Does pain affect daily life?: no Are you currently prescribed opioids?: no  Dietary Habits and Nutritional Risks How many meals a day?: 2 Eats fruit and vegetables daily?: yes Most  meals are obtained by: having others provide food In the last 2 weeks, have you had any of the following?: none Diabetic:: (!) yes Any non-healing wounds?: no How often do you check your BS?: 1 Would you like to be referred to a Nutritionist or for Diabetic Management? : no  Functional Status Activities of Daily Living (to include ambulation/medication): Independent Ambulation: Independent Medication Administration: Independent Home Management (perform basic housework or laundry): Independent Manage your own finances?: yes Primary transportation is: family / friends; driving Concerns about vision?: no *vision screening is required for WTM* Concerns about hearing?: no  Fall Screening Falls in the past year?: 0 Number of falls in past year: 0 Was there an injury with Fall?: 0 Fall Risk Category Calculator: 0 Patient Fall Risk Level: Low Fall Risk  Fall Risk Patient at Risk for Falls Due to: No Fall Risks Fall risk Follow up: Falls evaluation completed  Home and Transportation Safety: All rugs have non-skid backing?: N/A, no rugs All stairs or steps have railings?: N/A, no stairs Grab bars in the bathtub or shower?: yes Have non-skid surface in bathtub or shower?: yes Good home lighting?: yes Regular seat belt use?: yes Hospital stays in the last year:: no  Cognitive Assessment Difficulty concentrating, remembering, or making decisions? : yes Will 6CIT or Mini Cog be Completed: yes What year is it?: 0 points What month is it?: 0 points Give patient an address phrase to remember (5 components): [address] [place] About what time is it?: 0 points Count backwards from 20 to 1: 0 points Say the months of the year in reverse: 2 points Repeat the address phrase from earlier: 0 points 6 CIT Score: 2 points  Advance Directives (For Healthcare) Does Patient Have a Medical Advance Directive?:  No Would patient like information on creating a medical advance directive?: No -  Patient declined  Reviewed/Updated  Reviewed/Updated: Reviewed All (Medical, Surgical, Family, Medications, Allergies, Care Teams, Patient Goals)    Allergies (verified) Celexa  [citalopram ], Other, Codeine, Metformin, Robaxin  [methocarbamol ], Tizanidine , Crestor  [rosuvastatin ], Nickel, and Simvastatin   Current Medications (verified) Outpatient Encounter Medications as of 12/17/2024  Medication Sig   acetaminophen  (TYLENOL ) 650 MG CR tablet Take 1,300 mg by mouth every 8 (eight) hours as needed for pain.   aspirin EC 81 MG tablet Take 81 mg by mouth daily.   Carboxymethylcellulose Sodium (THERATEARS OP) Place 1 drop into both eyes at bedtime as needed (dry eyes).   clobetasol  ointment (TEMOVATE ) 0.05 % Apply topically 2 (two) times daily. For two weeks. Then decrease to daily as needed for symptoms.   Dulaglutide  (TRULICITY ) 0.75 MG/0.5ML SOAJ Inject 0.75 mg into the skin once a week.   famotidine (PEPCID) 20 MG tablet Take 20 mg by mouth daily as needed for heartburn or indigestion.   Flaxseed, Linseed, (FLAX SEED OIL PO) Take 1 capsule by mouth daily.   fluconazole  (DIFLUCAN ) 150 MG tablet Take 1 tablet (150 mg total) by mouth every 3 (three) days.   fluticasone (FLONASE) 50 MCG/ACT nasal spray Place 1 spray into both nostrils daily as needed for allergies.   Inclisiran Sodium  (LEQVIO  [place]) Inject into the skin every 6 (six) months. Next dose due in May   levothyroxine  (SYNTHROID ) 75 MCG tablet TAKE 1 TABLET BY MOUTH EVERY DAY   losartan  (COZAAR ) 50 MG tablet TAKE 1 TABLET BY MOUTH EVERY DAY   MAGNESIUM PO Take 1 tablet by mouth daily.   Multiple Vitamin (MULTIVITAMIN) tablet Take 1 tablet by mouth daily.   Skin Protectants, Misc. (EUCERIN) cream Apply 1 application  topically as needed (eczema).   traMADol  (ULTRAM ) 50 MG tablet Take 1 tablet (50 mg total) by mouth every 12 (twelve) hours as needed.   triamcinolone  cream (KENALOG ) 0.1 % Apply 1 application topically 2 (two) times daily  as needed (psoriasis).   Vitamin D , Cholecalciferol, 25 MCG (1000 UT) CAPS Take 2,000 Units by mouth daily.   No facility-administered encounter medications on file as of 12/17/2024.    History: Past Medical History:  Diagnosis Date   Allergy    Anxiety    Arthritis    CAD (coronary artery disease)    non-obstructive. cath 11/11   Claustrophobia    Diabetes mellitus    borderline diabetic - diet controlled    GERD (gastroesophageal reflux disease)    History of chicken pox    History of phobia    clastrophobia   Hyperlipidemia    Hypertension    Hypothyroidism    Kidney stones    hx of    Lichen sclerosus    Psoriasis    Stroke (HCC)    Urinary tract infection    hx of    Past Surgical History:  Procedure Laterality Date   APPENDECTOMY     CARDIAC CATHETERIZATION     2011   CARPAL TUNNEL RELEASE     Left trigger finger   CATARACT EXTRACTION W/PHACO Left 06/06/2018   Procedure: CATARACT EXTRACTION PHACO AND INTRAOCULAR LENS PLACEMENT (IOC);  Surgeon: Jaye Fallow, MD;  Location: [facility] ORS;  Service: Ophthalmology;  Laterality: Left;  US   00:23 AP% 16.0 CDE 3.69 Fluid pack lot # [id number]   CATARACT EXTRACTION W/PHACO Right 09/19/2018   Procedure: CATARACT EXTRACTION PHACO AND INTRAOCULAR LENS PLACEMENT (IOC);  Surgeon: Jaye,  Elsie, MD;  Location: [facility] ORS;  Service: Ophthalmology;  Laterality: Right;  US  00:29.5 AP% 17.0 CDE$ 5.03 Fluid pack lot # [id number]   COLONOSCOPY WITH PROPOFOL  N/A 05/13/2017   Procedure: COLONOSCOPY WITH PROPOFOL ;  Surgeon: Viktoria Lamar DASEN, MD;  Location: [facility] ENDOSCOPY;  Service: Endoscopy;  Laterality: N/A;   ESOPHAGOGASTRODUODENOSCOPY (EGD) WITH PROPOFOL  N/A 01/04/2020   Procedure: ESOPHAGOGASTRODUODENOSCOPY (EGD) WITH PROPOFOL ;  Surgeon: Therisa Bi, MD;  Location: [facility] ENDOSCOPY;  Service: Gastroenterology;  Laterality: N/A;   EYE SURGERY  2004   lasik - right eye    JOINT REPLACEMENT     KNEE SURGERY Right 2015   arthroscopy    REVERSE SHOULDER ARTHROPLASTY Right 10/21/2022   Procedure: REVERSE SHOULDER ARTHROPLASTY;  Surgeon: Melita Drivers, MD;  Location: [facility] ORS;  Service: Orthopedics;  Laterality: Right;    TOTAL KNEE ARTHROPLASTY Bilateral 06/12/2015   Procedure: TOTAL KNEE BILATERAL;  Surgeon: Dempsey Moan, MD;  Location: [facility] ORS;  Service: Orthopedics;  Laterality: Bilateral;  and epidural   TUBAL LIGATION     Tubes tied     and untied. and tied again   Family History  Problem Relation Age of Onset   COPD Mother    Diabetes Mother    Hypertension Mother    Hypertension Father    Diabetes Sister    Hypertension Sister    Diabetes Brother    Arthritis Brother    Heart disease Brother    Arthritis Brother    Diabetes Brother    Heart disease Brother    Stomach cancer Maternal Grandfather    Stroke Maternal Grandfather    Breast cancer Daughter 22       Double mastectomy   Social History   Occupational History   Occupation: Retired  Tobacco Use   Smoking status: Never   Smokeless tobacco: Never  Vaping Use   Vaping status: Never Used  Substance and Sexual Activity   Alcohol use: Yes    Comment: rare   Drug use: No   Sexual activity: Not Currently    Birth control/protection: Post-menopausal   Tobacco Counseling Counseling given: Not Answered  SDOH Screenings   Food Insecurity: No Food Insecurity (12/17/2024)  Housing: Low Risk (12/17/2024)  Transportation Needs: No Transportation Needs (12/17/2024)  Utilities: Not At Risk (12/17/2024)  Alcohol Screen: Low Risk (12/15/2023)  Depression (PHQ2-9): Low Risk (12/17/2024)  Recent Concern: Depression (PHQ2-9) - Medium Risk (12/12/2024)  Financial Resource Strain: Low Risk (12/15/2023)  Physical Activity: Insufficiently Active (12/17/2024)  Social Connections: Moderately Isolated (12/17/2024)  Stress: No Stress Concern Present (12/17/2024)  Tobacco Use: Low Risk (12/17/2024)  Health Literacy: Adequate Health Literacy  (12/17/2024)   See flowsheets for full screening details  Depression Screen PHQ 2 & 9 Depression Scale- Over the past 2 weeks, how often have you been bothered by any of the following problems? Little interest or pleasure in doing things: 0 Feeling down, depressed, or hopeless (PHQ Adolescent also includes...irritable): 0 PHQ-2 Total Score: 0 Trouble falling or staying asleep, or sleeping too much: 0 Feeling tired or having little energy: 0 Poor appetite or overeating (PHQ Adolescent also includes...weight loss): 0 Feeling bad about yourself - or that you are a failure or have let yourself or your family down: 0 Trouble concentrating on things, such as reading the newspaper or watching television (PHQ Adolescent also includes...like school work): 0 Moving or speaking so slowly that other people could have noticed. Or the opposite - being so fidgety or restless that you have  been moving around a lot more than usual: 0 Thoughts that you would be better off dead, or of hurting yourself in some way: 0 PHQ-9 Total Score: 0 If you checked off any problems, how difficult have these problems made it for you to do your work, take care of things at home, or get along with other people?: Not difficult at all     Goals Addressed             This Visit's Progress    Patient Stated   On track    10/07/2020, I will maintain and continue medications as prescribed.             Objective:    Today's Vitals   12/17/24 1611  BP: 139/72  Weight: 127 lb (57.6 kg)  Height: 5' 2.5 (1.588 m)   Body mass index is 22.86 kg/m.  Hearing/Vision screen Hearing Screening - Comments:: No hearing aids  Vision Screening - Comments:: No difficulties  Immunizations and Health Maintenance Health Maintenance  Topic Date Due   Zoster Vaccines- Shingrix (1 of 2) Never done   Colonoscopy  05/13/2022   DTaP/Tdap/Td (2 - Td or Tdap) 09/27/2022   FOOT EXAM  06/07/2024   COVID-19 Vaccine (7 - 2025-26  season) 08/27/2024   Medicare Annual Wellness (AWV)  12/14/2024   Mammogram  01/22/2025   HEMOGLOBIN A1C  06/12/2025   OPHTHALMOLOGY EXAM  06/25/2025   Diabetic kidney evaluation - eGFR measurement  12/12/2025   Diabetic kidney evaluation - Urine ACR  12/12/2025   Bone Density Scan  12/31/2026   Pneumococcal Vaccine: 50+ Years  Completed   Influenza Vaccine  Completed   Meningococcal B Vaccine  Aged Out   Hepatitis C Screening  Discontinued        Assessment/Plan:  This is a routine wellness examination for Renee [name].  Patient Care Team: Avelina Greig BRAVO, MD as PCP - General (Family Medicine) Melodi Lerner, MD as Consulting Physician (Orthopedic Surgery) Perla Evalene PARAS, MD as Consulting Physician (Cardiology) Dasher, Alm LABOR, MD as Consulting Physician (Dermatology) Jaye Fallow, MD as Referring Physician (Ophthalmology) Fate Morna SAILOR, [facility] (Inactive) as Pharmacist (Pharmacist)  I have personally reviewed and noted the following in the patients chart:   Medical and social history Use of alcohol, tobacco or illicit drugs  Current medications and supplements including opioid prescriptions. Functional ability and status Nutritional status Physical activity Advanced directives List of other physicians Hospitalizations, surgeries, and ER visits in previous 12 months Vitals Screenings to include cognitive, depression, and falls Referrals and appointments  No orders of the defined types were placed in this encounter.  In addition, I have reviewed and discussed with patient certain preventive protocols, quality metrics, and best practice recommendations. A written personalized care plan for preventive services as well as general preventive health recommendations were provided to patient.   Lyle MARLA Right, [place]   12/17/2024   No follow-ups on file.  After Visit Summary: (MyChart) Due to this being a telephonic visit, the after visit summary with patients  personalized plan was offered to patient via MyChart   No voiced or noted concerns at this time Vaccines not given: tdap, and covid declined today Screenings not ordered: Will discuss colonoscopy with PCP at next visit HM Addressed: colon  "

## 2024-12-17 NOTE — Telephone Encounter (Signed)
 Pt  caregiver called to speak to MD . [name]  called stating they have spoke to PT PCP and  requested for pt to follow up with Neurologist due to  Pt medication Inclisiran Sodium  (LEQVIO  [place])  is giving  Pt a bad reaction . Tammy stated  MD was the last to give Pt medication and is  not sure what to  ,

## 2024-12-17 NOTE — Telephone Encounter (Signed)
 Called pt's daughter back and she stated that she thinks pt is having some reactions to Inclisiran Sodium  (LEQVIO  [place]). Pt's daughter states that pt is having some arthritis pain, back pain,etc.  Spoke with MD and he stated that pt shouldn't have these type of reactions and should follow up with PCP.

## 2024-12-19 NOTE — Telephone Encounter (Signed)
 Gave pt a call pt has not return pap Secretary/administrator ([employer]ul[employer] ),spoke with pt daughter explain at this have stop the medication,and will go over with provider on her next appt ,pt will call back if provider puts her back on it.

## 2024-12-25 ENCOUNTER — Encounter: Admitting: Family Medicine

## 2024-12-25 ENCOUNTER — Ambulatory Visit: Admitting: Podiatry

## 2024-12-25 DIAGNOSIS — Q828 Other specified congenital malformations of skin: Secondary | ICD-10-CM | POA: Diagnosis not present

## 2024-12-25 NOTE — Progress Notes (Signed)
 "  Subjective:  Patient ID: Renee [name], female    DOB: October 12, 1946,  MRN: [id number]  Chief Complaint  Patient presents with   Porokeratosis    78 y.o. female presents with the above complaint.  Patient presents with complaint of bilateral fifth metatarsal hyperkeratotic lesion.  Patient stated they are still very painful but the debridement does help.   Review of Systems: Negative except as noted in the HPI. Denies N/V/F/Ch.  Past Medical History:  Diagnosis Date   Allergy    Anxiety    Arthritis    CAD (coronary artery disease)    non-obstructive. cath 11/11   Claustrophobia    Diabetes mellitus    borderline diabetic - diet controlled    GERD (gastroesophageal reflux disease)    History of chicken pox    History of phobia    clastrophobia   Hyperlipidemia    Hypertension    Hypothyroidism    Kidney stones    hx of    Lichen sclerosus    Psoriasis    Stroke (HCC)    Urinary tract infection    hx of     Current Outpatient Medications:    acetaminophen  (TYLENOL ) 650 MG CR tablet, Take 1,300 mg by mouth every 8 (eight) hours as needed for pain., Disp: , Rfl:    aspirin EC 81 MG tablet, Take 81 mg by mouth daily., Disp: , Rfl:    Carboxymethylcellulose Sodium (THERATEARS OP), Place 1 drop into both eyes at bedtime as needed (dry eyes)., Disp: , Rfl:    clobetasol  ointment (TEMOVATE ) 0.05 %, Apply topically 2 (two) times daily. For two weeks. Then decrease to daily as needed for symptoms., Disp: 60 g, Rfl: 5   Dulaglutide  (TRULICITY ) 0.75 MG/0.5ML SOAJ, Inject 0.75 mg into the skin once a week., Disp: 2 mL, Rfl: 3   famotidine (PEPCID) 20 MG tablet, Take 20 mg by mouth daily as needed for heartburn or indigestion., Disp: , Rfl:    Flaxseed, Linseed, (FLAX SEED OIL PO), Take 1 capsule by mouth daily., Disp: , Rfl:    fluconazole  (DIFLUCAN ) 150 MG tablet, Take 1 tablet (150 mg total) by mouth every 3 (three) days., Disp: 2 tablet, Rfl: 0   fluticasone (FLONASE) 50 MCG/ACT  nasal spray, Place 1 spray into both nostrils daily as needed for allergies., Disp: , Rfl:    Inclisiran Sodium  (LEQVIO  [place]), Inject into the skin every 6 (six) months. Next dose due in May, Disp: , Rfl:    levothyroxine  (SYNTHROID ) 75 MCG tablet, TAKE 1 TABLET BY MOUTH EVERY DAY, Disp: 90 tablet, Rfl: 3   losartan  (COZAAR ) 50 MG tablet, TAKE 1 TABLET BY MOUTH EVERY DAY, Disp: 90 tablet, Rfl: 1   MAGNESIUM PO, Take 1 tablet by mouth daily., Disp: , Rfl:    Multiple Vitamin (MULTIVITAMIN) tablet, Take 1 tablet by mouth daily., Disp: , Rfl:    Skin Protectants, Misc. (EUCERIN) cream, Apply 1 application  topically as needed (eczema)., Disp: , Rfl:    traMADol  (ULTRAM ) 50 MG tablet, Take 1 tablet (50 mg total) by mouth every 12 (twelve) hours as needed., Disp: 60 tablet, Rfl: 0   triamcinolone  cream (KENALOG ) 0.1 %, Apply 1 application topically 2 (two) times daily as needed (psoriasis)., Disp: 454 g, Rfl: 0   Vitamin D , Cholecalciferol, 25 MCG (1000 UT) CAPS, Take 2,000 Units by mouth daily., Disp: , Rfl:   Social History   Tobacco Use  Smoking Status Never  Smokeless Tobacco Never  Allergies  Allergen Reactions   Celexa  [Citalopram ] Shortness Of Breath and Swelling    Throat swelling and difficulty breathing   Other Rash    Metal    Codeine Nausea And Vomiting and Other (See Comments)   Metformin Nausea And Vomiting   Robaxin  [Methocarbamol ] Nausea Only   Tizanidine  Other (See Comments)    Oversedated   Crestor  [Rosuvastatin ] Rash   Nickel Rash   Simvastatin Swelling and Rash   Objective:   There were no vitals filed for this visit.  There is no height or weight on file to calculate BMI. Constitutional Well developed. Well nourished.  Vascular Dorsalis pedis pulses palpable bilaterally. Posterior tibial pulses palpable bilaterally. Capillary refill normal to all digits.  No cyanosis or clubbing noted. Pedal hair growth normal.  Neurologic Normal speech. Oriented to  person, place, and time. Epicritic sensation to light touch grossly present bilaterally.  Dermatologic Nail Exam: Pt has thick disfigured discolored nails with subungual debris noted bilateral entire nail hallux through fifth toenails.  Pain on palpation to the nails. No open wounds. No skin lesions.  Orthopedic: Bilateral submetatarsal 5 porokeratotic/benign skin lesion with central nucleated core noted.  Upon debridement no pinpoint bleeding noted.  No wounds noted.   Radiographs:   3 views of skeletally mature adult bilateral foot: Midfoot arthritis noted.  Plantar and posterior heel spurring noted.  Plantarflexed fifth metatarsal noted with mild signs of tailor's bunion.  There is increase in lateral deviation angle. Assessment:   No diagnosis found.        Plan:  Patient was evaluated and treated and all questions answered.  Bilateral submetatarsal 5 porokeratosis x2 -X-ray to the patient the etiology of porokeratosis and was treatment options were extensively discussed.  Given the amount of pain she is having I believe she will benefit from debridement of the lesion followed by excision of the lesion.  I discussed this with the patient she states understanding like to proceed with that. -Using chisel blade and handle the lesion was debrided down to healthy striated tissue followed by excision of central nucleated core.  Immediate pain was noted after debridement and excision.  No pinpoint bleeding noted.    No follow-ups on file.     No follow-ups on file.  "

## 2025-01-01 ENCOUNTER — Ambulatory Visit: Admitting: Podiatry

## 2025-01-04 ENCOUNTER — Telehealth: Payer: Self-pay | Admitting: Pharmacist

## 2025-01-04 NOTE — Telephone Encounter (Signed)
 Per Wes from [employer], patient stopped leqvio  due to side effects. Would be happy to discuss other options with her if you would like.

## 2025-01-14 ENCOUNTER — Encounter (HOSPITAL_COMMUNITY): Payer: Self-pay | Admitting: Pharmacist

## 2025-01-15 ENCOUNTER — Other Ambulatory Visit (HOSPITAL_COMMUNITY): Payer: Self-pay | Admitting: Pharmacist

## 2025-01-15 NOTE — Progress Notes (Signed)
 Therapy plan for Leqvio  discontinued and appointment cancelled. Patient experienced side effects. [facility] is aware - they will further discuss with prescriber.  Sherry Pennant, PharmD, MPH, BCPS, CPP Clinical Pharmacist

## 2025-01-23 ENCOUNTER — Encounter: Payer: Self-pay | Admitting: Family Medicine

## 2025-01-23 LAB — HM MAMMOGRAPHY

## 2025-01-24 ENCOUNTER — Ambulatory Visit: Payer: Self-pay | Admitting: Family Medicine

## 2025-02-01 ENCOUNTER — Encounter: Payer: Self-pay | Admitting: Family Medicine

## 2025-02-01 ENCOUNTER — Ambulatory Visit: Admitting: Family Medicine

## 2025-02-01 VITALS — BP 160/70 | HR 85 | Temp 98.2°F | Ht 62.5 in | Wt 123.5 lb

## 2025-02-01 DIAGNOSIS — E1159 Type 2 diabetes mellitus with other circulatory complications: Secondary | ICD-10-CM

## 2025-02-01 DIAGNOSIS — I152 Hypertension secondary to endocrine disorders: Secondary | ICD-10-CM

## 2025-02-01 DIAGNOSIS — E1169 Type 2 diabetes mellitus with other specified complication: Secondary | ICD-10-CM

## 2025-02-01 DIAGNOSIS — E039 Hypothyroidism, unspecified: Secondary | ICD-10-CM

## 2025-02-01 LAB — HM DIABETES FOOT EXAM

## 2025-02-01 NOTE — Progress Notes (Unsigned)
 "   Patient ID: Renee [name], female    DOB: [birth year], 79 y.o.   MRN: [id number]  This visit was conducted in person.  BP (!) 160/70   Pulse 85   Temp 98.2 F (36.8 C) (Temporal)   Ht 5' 2.5 (1.588 m)   Wt 123 lb 8 oz (56 kg)   SpO2 98%   BMI 22.23 kg/m    CC:  Chief Complaint  Patient presents with   Medical Management of Chronic Issues    Yearly Check Up-Not CPE MWV 12/17/24    Subjective:   HPI: Renee [name] is a 79 y.o. female presenting on 02/01/2025 for Medical Management of Chronic Issues (Yearly Check Up-Not CPE/MWV 12/17/24)    The patient presents for Annual review of chronic health problems. He/She also has the following acute concerns today:  AMW 12/17/2024 reviewed.   Joint pain:  Has been much better lately... has not taken tramadol  since early January. May have been due to Leqvio .  Using tylenol    500 mg  2 tablets  twice daily as well  Has seen rheum in past Dr. Dolphus.  X-ray in hands in past 07/2021 note reviewed  OA seen, no clear RA.  Does have psoriasis.[name] but no mention of psoriatic arthritis mentioned.  Has  tramadol  50 mg BID for pain.  HTN:  not at goal in office today    losartan  to 50 mg daily.... was on higher dose  100 mg but felt dizzy and BP 95/53... now at home 11-142/56/75 BP Readings from Last 3 Encounters:  02/01/25 (!) 160/70  12/17/24 139/72  12/12/24 (!) 140/62   Hypothyroid  Lab Results  Component Value Date   TSH 4.74 12/13/2024     DM  Limited activity..increased A1C off of Trulicity . Lab Results  Component Value Date   HGBA1C 7.0 (H) 12/12/2024    Elevated Cholesterol: does not plan to do Levqio.. not on zetyia but may bnee to restart. Lab Results  Component Value Date   CHOL 90 12/12/2024   HDL 42.00 12/12/2024   LDLCALC 31 12/12/2024   LDLDIRECT 121.0 06/01/2023   TRIG 84.0 12/12/2024   CHOLHDL 2 12/12/2024  Using medications without problems: Muscle aches:  Diet compliance: eating better. Exercise:  no but plans to restart Other complaints:        Relevant past medical, surgical, family and social history reviewed and updated as indicated. Interim medical history since our last visit reviewed. Allergies and medications reviewed and updated. Outpatient Medications Prior to Visit  Medication Sig Dispense Refill   acetaminophen  (TYLENOL ) 650 MG CR tablet Take 1,300 mg by mouth every 8 (eight) hours as needed for pain.     aspirin EC 81 MG tablet Take 81 mg by mouth daily.     Carboxymethylcellulose Sodium (THERATEARS OP) Place 1 drop into both eyes at bedtime as needed (dry eyes).     clobetasol  ointment (TEMOVATE ) 0.05 % Apply topically 2 (two) times daily. For two weeks. Then decrease to daily as needed for symptoms. 60 g 5   famotidine (PEPCID) 20 MG tablet Take 20 mg by mouth daily as needed for heartburn or indigestion.     Flaxseed, Linseed, (FLAX SEED OIL PO) Take 1 capsule by mouth daily.     fluticasone (FLONASE) 50 MCG/ACT nasal spray Place 1 spray into both nostrils daily as needed for allergies.     levothyroxine  (SYNTHROID ) 75 MCG tablet TAKE 1 TABLET BY MOUTH EVERY DAY 90 tablet 3  losartan  (COZAAR ) 50 MG tablet TAKE 1 TABLET BY MOUTH EVERY DAY 90 tablet 1   MAGNESIUM PO Take 1 tablet by mouth daily.     Multiple Vitamin (MULTIVITAMIN) tablet Take 1 tablet by mouth daily.     neomycin-polymyxin b-dexamethasone  (MAXITROL) 3.5-10000-0.1 SUSP 5 drops. In both ears x bid x 14 days     Skin Protectants, Misc. (EUCERIN) cream Apply 1 application  topically as needed (eczema).     triamcinolone  cream (KENALOG ) 0.1 % Apply 1 application topically 2 (two) times daily as needed (psoriasis). 454 g 0   Vitamin D , Cholecalciferol, 25 MCG (1000 UT) CAPS Take 2,000 Units by mouth daily.     Dulaglutide  (TRULICITY ) 0.75 MG/0.5ML SOAJ Inject 0.75 mg into the skin once a week. 2 mL 3   fluconazole  (DIFLUCAN ) 150 MG tablet Take 1 tablet (150 mg total) by mouth every 3  (three) days. 2 tablet 0   Inclisiran Sodium  (LEQVIO  ) Inject into the skin every 6 (six) months. Next dose due in May     No facility-administered medications prior to visit.     Per HPI unless specifically indicated in ROS section below Review of Systems  Constitutional:  Negative for fatigue and fever.  HENT:  Negative for congestion.   Eyes:  Negative for pain.  Respiratory:  Negative for cough and shortness of breath.   Cardiovascular:  Negative for chest pain, palpitations and leg swelling.  Gastrointestinal:  Negative for abdominal pain.  Genitourinary:  Negative for dysuria and vaginal bleeding.  Musculoskeletal:  Negative for back pain.  Neurological:  Negative for syncope, light-headedness and headaches.  Psychiatric/Behavioral:  Negative for dysphoric mood.    Objective:  BP (!) 160/70   Pulse 85   Temp 98.2 F (36.8 C) (Temporal)   Ht 5' 2.5 (1.588 m)   Wt 123 lb 8 oz (56 kg)   SpO2 98%   BMI 22.23 kg/m   Wt Readings from Last 3 Encounters:  02/01/25 123 lb 8 oz (56 kg)  12/17/24 127 lb (57.6 kg)  12/12/24 126 lb (57.2 kg)      Physical Exam Constitutional:      General: She is not in acute distress.    Appearance: Normal appearance. She is well-developed. She is not ill-appearing or toxic-appearing.  HENT:     Head: Normocephalic.     Right Ear: Hearing, tympanic membrane, ear canal and external ear normal. Tympanic membrane is not erythematous, retracted or bulging.     Left Ear: Hearing, tympanic membrane, ear canal and external ear normal. Tympanic membrane is not erythematous, retracted or bulging.     Nose: No mucosal edema or rhinorrhea.     Right Sinus: No maxillary sinus tenderness or frontal sinus tenderness.     Left Sinus: No maxillary sinus tenderness or frontal sinus tenderness.     Mouth/Throat:     Pharynx: Uvula midline.  Eyes:     General: Lids are normal. Lids are everted, no foreign bodies appreciated.     Conjunctiva/sclera:  Conjunctivae normal.     Pupils: Pupils are equal, round, and reactive to light.  Neck:     Thyroid : No thyroid  mass or thyromegaly.     Vascular: No carotid bruit.     Trachea: Trachea normal.  Cardiovascular:     Rate and Rhythm: Normal rate and regular rhythm.     Pulses: Normal pulses.     Heart sounds: Normal heart sounds, S1 normal and S2 normal. No murmur heard.  No friction rub. No gallop.  Pulmonary:     Effort: Pulmonary effort is normal. No tachypnea or respiratory distress.     Breath sounds: Normal breath sounds. No decreased breath sounds, wheezing, rhonchi or rales.  Abdominal:     General: Bowel sounds are normal.     Palpations: Abdomen is soft.     Tenderness: There is no abdominal tenderness.  Musculoskeletal:     Right wrist: Swelling and tenderness present. Decreased range of motion.     Left wrist: Swelling and tenderness present. Decreased range of motion.     Right hand: Swelling present. No tenderness. Decreased range of motion.     Left hand: Swelling and tenderness present. Decreased range of motion.     Cervical back: Normal range of motion and neck supple.  Skin:    General: Skin is warm and dry.     Findings: No rash.  Neurological:     Mental Status: She is alert.  Psychiatric:        Mood and Affect: Mood is not anxious or depressed.        Speech: Speech normal.        Behavior: Behavior normal. Behavior is cooperative.        Thought Content: Thought content normal.        Judgment: Judgment normal.   Diabetic foot exam: Normal inspection No skin breakdown No calluses  Normal DP pulses Normal sensation to light touch and monofilament Nails normal     Results for orders placed or performed in visit on 02/01/25  [facility] DIABETES FOOT EXAM   Collection Time: 02/01/25 12:00 AM  Result Value Ref Range   [facility] Diabetic Foot Exam done     Assessment and Plan The patient's preventative maintenance and recommended screening tests for an annual  wellness exam were reviewed in full today. Brought up to date unless services declined.  Counselled on the importance of diet, exercise, and its role in overall health and mortality. The patient's FH and SH was reviewed, including their home life, tobacco status, and drug and alcohol status.    Vaccines:  Flu , PNA and COVID x 3 uptodate . Shingles, TD - considering Foot exam - DONE  Colon cancer screening - colonoscopy 04/2017,  no further indicated.   Mammo: 12/2024 Bone density - 12/2021 normal Hep C screening - done  Nonsmoker Hypertension associated with diabetes (HCC) Assessment & Plan: Chronic,   Now back on losaratn 50 mg daily given lows on 100 mg.  At home BP at goal.   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   She continues Zetia  10 mg p.o. daily.    cannot tolerate Leqvio .   Adult hypothyroidism Assessment & Plan: Stable, chronic.  Continue current medication.  on levothyroxine  75 mcg daily.      Return in about 4 months (around 06/01/2025) for diabetes follow up, with fasting labs  prior.   Greig Ring, MD  "

## 2025-02-01 NOTE — Assessment & Plan Note (Addendum)
" °  Chronic, improved control LDL goal < 70 given CAD.     Will recheck in 4 month.. may need tpo restart zetia .    cannot tolerate Leqvio . "

## 2025-02-01 NOTE — Assessment & Plan Note (Addendum)
"  °  Chronic, associated with hypertension  No longer on trulicity   Lab Results  Component Value Date   HGBA1C 7.0 (H) 12/12/2024       "

## 2025-02-01 NOTE — Assessment & Plan Note (Addendum)
 Chronic,   Now back on losaratn 50 mg daily given lows on 100 mg.  At home BP at goal.

## 2025-02-01 NOTE — Assessment & Plan Note (Signed)
 Stable, chronic.  Continue current medication.  on levothyroxine  75 mcg daily.

## 2025-02-05 ENCOUNTER — Encounter: Admitting: Family Medicine

## 2025-03-26 ENCOUNTER — Ambulatory Visit: Admitting: Podiatry

## 2025-05-13 ENCOUNTER — Ambulatory Visit

## 2025-05-28 ENCOUNTER — Other Ambulatory Visit

## 2025-06-04 ENCOUNTER — Ambulatory Visit: Admitting: Family Medicine

## 2025-07-22 ENCOUNTER — Ambulatory Visit: Admitting: Adult Health
